# Patient Record
Sex: Male | Born: 1975 | Race: White | Hispanic: No | Marital: Single | Smoking: Former smoker
Health system: Southern US, Community
[De-identification: ages and names within clinical notes are randomized; demographics above are authoritative.]

## PROBLEM LIST (undated history)

## (undated) DIAGNOSIS — D6481 Anemia due to antineoplastic chemotherapy: Principal | ICD-10-CM

## (undated) DIAGNOSIS — R51 Headache: Secondary | ICD-10-CM

## (undated) DIAGNOSIS — K219 Gastro-esophageal reflux disease without esophagitis: Secondary | ICD-10-CM

## (undated) DIAGNOSIS — F419 Anxiety disorder, unspecified: Secondary | ICD-10-CM

## (undated) DIAGNOSIS — F1010 Alcohol abuse, uncomplicated: Secondary | ICD-10-CM

## (undated) DIAGNOSIS — R5383 Other fatigue: Secondary | ICD-10-CM

## (undated) DIAGNOSIS — E876 Hypokalemia: Principal | ICD-10-CM

## (undated) DIAGNOSIS — IMO0001 Reserved for inherently not codable concepts without codable children: Secondary | ICD-10-CM

## (undated) DIAGNOSIS — C801 Malignant (primary) neoplasm, unspecified: Secondary | ICD-10-CM

## (undated) DIAGNOSIS — T451X5A Adverse effect of antineoplastic and immunosuppressive drugs, initial encounter: Principal | ICD-10-CM

## (undated) DIAGNOSIS — J449 Chronic obstructive pulmonary disease, unspecified: Secondary | ICD-10-CM

## (undated) DIAGNOSIS — E039 Hypothyroidism, unspecified: Secondary | ICD-10-CM

## (undated) DIAGNOSIS — R569 Unspecified convulsions: Secondary | ICD-10-CM

## (undated) DIAGNOSIS — F322 Major depressive disorder, single episode, severe without psychotic features: Secondary | ICD-10-CM

## (undated) DIAGNOSIS — F1020 Alcohol dependence, uncomplicated: Secondary | ICD-10-CM

## (undated) DIAGNOSIS — C140 Malignant neoplasm of pharynx, unspecified: Secondary | ICD-10-CM

## (undated) HISTORY — DX: Anemia due to antineoplastic chemotherapy: D64.81

## (undated) HISTORY — DX: Other fatigue: R53.83

## (undated) HISTORY — DX: Hypothyroidism, unspecified: E03.9

## (undated) HISTORY — DX: Adverse effect of antineoplastic and immunosuppressive drugs, initial encounter: T45.1X5A

## (undated) HISTORY — DX: Anxiety disorder, unspecified: F41.9

## (undated) HISTORY — DX: Hypokalemia: E87.6

## (undated) HISTORY — DX: Headache: R51

## (undated) HISTORY — PX: APPENDECTOMY: SHX54

## (undated) HISTORY — PX: KNEE SURGERY: SHX244

---

## 2000-10-06 ENCOUNTER — Emergency Department (HOSPITAL_COMMUNITY): Admission: EM | Admit: 2000-10-06 | Discharge: 2000-10-06 | Payer: Self-pay | Admitting: Emergency Medicine

## 2000-10-21 ENCOUNTER — Emergency Department (HOSPITAL_COMMUNITY): Admission: EM | Admit: 2000-10-21 | Discharge: 2000-10-21 | Payer: Self-pay | Admitting: Emergency Medicine

## 2001-04-05 ENCOUNTER — Emergency Department (HOSPITAL_COMMUNITY): Admission: EM | Admit: 2001-04-05 | Discharge: 2001-04-05 | Payer: Self-pay | Admitting: Emergency Medicine

## 2001-06-25 ENCOUNTER — Emergency Department (HOSPITAL_COMMUNITY): Admission: EM | Admit: 2001-06-25 | Discharge: 2001-06-26 | Payer: Self-pay | Admitting: Emergency Medicine

## 2001-06-25 ENCOUNTER — Encounter: Payer: Self-pay | Admitting: *Deleted

## 2004-03-11 ENCOUNTER — Emergency Department (HOSPITAL_COMMUNITY): Admission: EM | Admit: 2004-03-11 | Discharge: 2004-03-12 | Payer: Self-pay | Admitting: Emergency Medicine

## 2005-06-26 ENCOUNTER — Emergency Department (HOSPITAL_COMMUNITY): Admission: EM | Admit: 2005-06-26 | Discharge: 2005-06-26 | Payer: Self-pay | Admitting: Emergency Medicine

## 2007-03-09 ENCOUNTER — Emergency Department (HOSPITAL_COMMUNITY): Admission: EM | Admit: 2007-03-09 | Discharge: 2007-03-09 | Payer: Self-pay | Admitting: Emergency Medicine

## 2008-03-24 ENCOUNTER — Emergency Department (HOSPITAL_COMMUNITY): Admission: EM | Admit: 2008-03-24 | Discharge: 2008-03-24 | Payer: Self-pay | Admitting: Emergency Medicine

## 2009-02-19 ENCOUNTER — Emergency Department (HOSPITAL_COMMUNITY): Admission: EM | Admit: 2009-02-19 | Discharge: 2009-02-19 | Payer: Self-pay | Admitting: Emergency Medicine

## 2009-02-23 ENCOUNTER — Ambulatory Visit (HOSPITAL_COMMUNITY): Admission: RE | Admit: 2009-02-23 | Discharge: 2009-02-24 | Payer: Self-pay | Admitting: Orthopaedic Surgery

## 2009-09-17 ENCOUNTER — Emergency Department (HOSPITAL_COMMUNITY): Admission: EM | Admit: 2009-09-17 | Discharge: 2009-09-18 | Payer: Self-pay | Admitting: Emergency Medicine

## 2010-03-09 ENCOUNTER — Emergency Department (HOSPITAL_COMMUNITY): Admission: EM | Admit: 2010-03-09 | Discharge: 2010-03-09 | Payer: Self-pay | Admitting: Emergency Medicine

## 2010-07-25 ENCOUNTER — Emergency Department (HOSPITAL_COMMUNITY): Admission: EM | Admit: 2010-07-25 | Discharge: 2010-03-16 | Payer: Self-pay | Admitting: Emergency Medicine

## 2010-09-24 ENCOUNTER — Emergency Department (HOSPITAL_COMMUNITY): Payer: Self-pay

## 2010-09-24 ENCOUNTER — Encounter (HOSPITAL_COMMUNITY): Payer: Self-pay

## 2010-09-24 ENCOUNTER — Emergency Department (HOSPITAL_COMMUNITY)
Admission: EM | Admit: 2010-09-24 | Discharge: 2010-09-24 | Disposition: A | Discharge status: Home / Self Care | Payer: Self-pay | Attending: Emergency Medicine | Admitting: Emergency Medicine

## 2010-09-24 DIAGNOSIS — Y92009 Unspecified place in unspecified non-institutional (private) residence as the place of occurrence of the external cause: Secondary | ICD-10-CM | POA: Insufficient documentation

## 2010-09-24 DIAGNOSIS — S022XXA Fracture of nasal bones, initial encounter for closed fracture: Secondary | ICD-10-CM | POA: Insufficient documentation

## 2010-09-24 DIAGNOSIS — S01501A Unspecified open wound of lip, initial encounter: Secondary | ICD-10-CM | POA: Insufficient documentation

## 2010-11-02 LAB — POCT I-STAT, CHEM 8
BUN: <3 mg/dL — ABNORMAL LOW (ref 6–23)
Calcium, Ion: 0.92 mmol/L — ABNORMAL LOW (ref 1.12–1.32)
Creatinine, Ser: 0.9 mg/dL (ref 0.4–1.5)
Glucose, Bld: 95 mg/dL (ref 70–99)
HCT: 45 % (ref 39.0–52.0)
Hemoglobin: 15.3 g/dL (ref 13.0–17.0)
Sodium: 138 mEq/L (ref 135–145)
TCO2: 22 mmol/L (ref 0–100)

## 2010-11-02 LAB — ETHANOL
Alcohol, Ethyl (B): 380 mg/dL — ABNORMAL HIGH (ref 0–10)
Alcohol, Ethyl (B): 520 mg/dL (ref 0–10)

## 2010-11-24 LAB — HEPATIC FUNCTION PANEL
AST: 43 U/L — ABNORMAL HIGH (ref 0–37)
Albumin: 3.4 g/dL — ABNORMAL LOW (ref 3.5–5.2)
Alkaline Phosphatase: 78 U/L (ref 39–117)
Bilirubin, Direct: 0.2 mg/dL (ref 0.0–0.3)
Total Bilirubin: 1.3 mg/dL — ABNORMAL HIGH (ref 0.3–1.2)

## 2010-11-24 LAB — BASIC METABOLIC PANEL
CO2: 25 mEq/L (ref 19–32)
Chloride: 100 mEq/L (ref 96–112)
Glucose, Bld: 100 mg/dL — ABNORMAL HIGH (ref 70–99)

## 2010-11-24 LAB — DIFFERENTIAL
Lymphocytes Relative: 23 % (ref 12–46)
Lymphs Abs: 1.6 10*3/uL (ref 0.7–4.0)
Neutrophils Relative %: 72 % (ref 43–77)

## 2010-11-24 LAB — COMPREHENSIVE METABOLIC PANEL
AST: 54 U/L — ABNORMAL HIGH (ref 0–37)
Alkaline Phosphatase: 101 U/L (ref 39–117)
CO2: 25 mEq/L (ref 19–32)
Chloride: 108 mEq/L (ref 96–112)
GFR calc Af Amer: >60 mL/min (ref >60)
GFR calc non Af Amer: >60 mL/min (ref >60)
Glucose, Bld: 100 mg/dL — ABNORMAL HIGH (ref 70–99)

## 2010-11-24 LAB — PROTIME-INR
INR: 1 (ref 0.00–1.49)
Prothrombin Time: 13.7 seconds (ref 11.6–15.2)

## 2010-11-24 LAB — CBC
HCT: 36.5 % — ABNORMAL LOW (ref 39.0–52.0)
HCT: 38.8 % — ABNORMAL LOW (ref 39.0–52.0)
Hemoglobin: 12.7 g/dL — ABNORMAL LOW (ref 13.0–17.0)
MCHC: 34.7 g/dL (ref 30.0–36.0)
RBC: 3.88 MIL/uL — ABNORMAL LOW (ref 4.22–5.81)
RDW: 13.9 % (ref 11.5–15.5)
RDW: 14.5 % (ref 11.5–15.5)

## 2010-11-24 LAB — SAMPLE TO BLOOD BANK

## 2010-11-24 LAB — APTT: aPTT: 33 seconds (ref 24–37)

## 2010-11-24 LAB — ETHANOL: Alcohol, Ethyl (B): 314 mg/dL — ABNORMAL HIGH (ref 0–10)
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
## 2011-05-16 LAB — POCT I-STAT, CHEM 8
BUN: 5 — ABNORMAL LOW
Chloride: 106
Creatinine, Ser: 0.7
Potassium: 3.5
Sodium: 142
TCO2: 23

## 2011-05-16 LAB — URINALYSIS, ROUTINE W REFLEX MICROSCOPIC
Glucose, UA: NEGATIVE
Hgb urine dipstick: NEGATIVE
Ketones, ur: NEGATIVE
Protein, ur: NEGATIVE
Urobilinogen, UA: 0.2

## 2011-05-16 LAB — RAPID URINE DRUG SCREEN, HOSP PERFORMED
Amphetamines: NOT DETECTED
Barbiturates: NOT DETECTED
Benzodiazepines: NOT DETECTED

## 2011-05-16 LAB — CBC
Platelets: 207
RDW: 13.8
WBC: 4.2

## 2011-05-16 LAB — ETHANOL: Alcohol, Ethyl (B): 451

## 2011-05-16 LAB — DIFFERENTIAL
Basophils Absolute: 0
Lymphocytes Relative: 49 — ABNORMAL HIGH
Lymphs Abs: 2.1
Neutro Abs: 1.7
Neutrophils Relative %: 40 — ABNORMAL LOW

## 2011-05-16 LAB — TRICYCLICS SCREEN, URINE: TCA Scrn: NOT DETECTED

## 2012-04-15 ENCOUNTER — Encounter (HOSPITAL_COMMUNITY): Payer: Self-pay

## 2012-04-15 ENCOUNTER — Emergency Department (HOSPITAL_COMMUNITY)
Admission: EM | Admit: 2012-04-15 | Discharge: 2012-04-16 | Disposition: A | Discharge status: Other Acute Inpatient Hospital | Payer: Self-pay | Attending: Emergency Medicine | Admitting: Emergency Medicine

## 2012-04-15 DIAGNOSIS — F1010 Alcohol abuse, uncomplicated: Secondary | ICD-10-CM | POA: Insufficient documentation

## 2012-04-15 HISTORY — DX: Alcohol abuse, uncomplicated: F10.10

## 2012-04-15 LAB — CBC
HCT: 39.9 % (ref 39.0–52.0)
Hemoglobin: 14.1 g/dL (ref 13.0–17.0)
MCH: 32.7 pg (ref 26.0–34.0)
MCV: 92.6 fL (ref 78.0–100.0)
Platelets: DECREASED 10*3/uL (ref 150–400)
RBC: 4.31 MIL/uL (ref 4.22–5.81)
WBC: 7.8 10*3/uL (ref 4.0–10.5)

## 2012-04-15 LAB — BASIC METABOLIC PANEL
CO2: 21 mEq/L (ref 19–32)
Chloride: 96 mEq/L (ref 96–112)
Creatinine, Ser: 0.45 mg/dL — ABNORMAL LOW (ref 0.50–1.35)
Glucose, Bld: 86 mg/dL (ref 70–99)

## 2012-04-15 LAB — RAPID URINE DRUG SCREEN, HOSP PERFORMED
Amphetamines: NOT DETECTED
Benzodiazepines: NOT DETECTED
Opiates: NOT DETECTED
Tetrahydrocannabinol: NOT DETECTED

## 2012-04-15 LAB — ETHANOL: Alcohol, Ethyl (B): 340 mg/dL — ABNORMAL HIGH (ref 0–11)

## 2012-04-15 MED ORDER — FOLIC ACID 1 MG PO TABS
1.0000 mg | ORAL_TABLET | Freq: Every day | ORAL | Status: DC
  Administered 2012-04-15 – 2012-04-16 (×2): 1 mg via ORAL
  Filled 2012-04-15 (×2): qty 1

## 2012-04-15 MED ORDER — VITAMIN B-1 100 MG PO TABS
100.0000 mg | ORAL_TABLET | Freq: Every day | ORAL | Status: DC
  Administered 2012-04-15 – 2012-04-16 (×2): 100 mg via ORAL
  Filled 2012-04-15 (×2): qty 1

## 2012-04-15 MED ORDER — ACETAMINOPHEN 325 MG PO TABS: 650.0000 mg | ORAL_TABLET | ORAL | Status: DC | PRN

## 2012-04-15 MED ORDER — ALUM & MAG HYDROXIDE-SIMETH 200-200-20 MG/5ML PO SUSP: 30.0000 mL | ORAL | Status: DC | PRN

## 2012-04-15 MED ORDER — ONDANSETRON HCL 4 MG PO TABS
4.0000 mg | ORAL_TABLET | Freq: Three times a day (TID) | ORAL | Status: DC | PRN
  Administered 2012-04-15 – 2012-04-16 (×2): 4 mg via ORAL
  Filled 2012-04-15 (×2): qty 1

## 2012-04-15 MED ORDER — NICOTINE 21 MG/24HR TD PT24
21.0000 mg | MEDICATED_PATCH | Freq: Every day | TRANSDERMAL | Status: DC
  Administered 2012-04-15 – 2012-04-16 (×2): 21 mg via TRANSDERMAL
  Filled 2012-04-15 (×2): qty 1

## 2012-04-15 MED ORDER — LORAZEPAM 1 MG PO TABS: 0.0000 mg | ORAL_TABLET | Freq: Two times a day (BID) | ORAL | Status: DC

## 2012-04-15 MED ORDER — THIAMINE HCL 100 MG/ML IJ SOLN: 100.0000 mg | Freq: Every day | INTRAMUSCULAR | Status: DC

## 2012-04-15 MED ORDER — ADULT MULTIVITAMIN W/MINERALS CH
1.0000 | ORAL_TABLET | Freq: Every day | ORAL | Status: DC
  Administered 2012-04-15 – 2012-04-16 (×2): 1 via ORAL
  Filled 2012-04-15 (×2): qty 1

## 2012-04-15 MED ORDER — LORAZEPAM 1 MG PO TABS
1.0000 mg | ORAL_TABLET | Freq: Three times a day (TID) | ORAL | Status: DC | PRN
  Administered 2012-04-15: 1 mg via ORAL
  Filled 2012-04-15 (×2): qty 1

## 2012-04-15 MED ORDER — LORAZEPAM 1 MG PO TABS
0.0000 mg | ORAL_TABLET | Freq: Four times a day (QID) | ORAL | Status: DC
  Administered 2012-04-16: 2 mg via ORAL
  Administered 2012-04-16: 1 mg via ORAL
  Filled 2012-04-15: qty 2

## 2012-04-15 MED ORDER — LORAZEPAM 1 MG PO TABS: 1.0000 mg | ORAL_TABLET | Freq: Four times a day (QID) | ORAL | Status: DC | PRN

## 2012-04-15 MED ORDER — LORAZEPAM 2 MG/ML IJ SOLN: 1.0000 mg | Freq: Four times a day (QID) | INTRAMUSCULAR | Status: DC | PRN

## 2012-04-15 MED ORDER — ZOLPIDEM TARTRATE 5 MG PO TABS: 5.0000 mg | ORAL_TABLET | Freq: Every evening | ORAL | Status: DC | PRN

## 2012-04-15 MED ORDER — IBUPROFEN 600 MG PO TABS: 600.0000 mg | ORAL_TABLET | Freq: Three times a day (TID) | ORAL | Status: DC | PRN

## 2012-04-15 NOTE — ED Provider Notes (Signed)
History     CSN: [id number]  Arrival date & time 04/15/12  1238   First MD Initiated Contact with Patient 04/15/12 1501      Chief Complaint  Patient presents with  . Medical Clearance    alcohol detox  . Alcohol Intoxication    (Consider location/radiation/quality/duration/timing/severity/associated sxs/prior treatment) HPI Pt presents with c/o alcohol abuse and is requesting detox.  He states he drinks approx 6, 40 ounce beers per day.  Last drink was this morning approx 7am.  He states he has had withdrawal symptoms in the past, but today has not had any significant complaints.  Denies other drug use.  Does smoke cigarettes.  C/o history of anxiety- states he has never been evaluated for this.  Denies SI/HI.  No recent illnesses  Past Medical History  Diagnosis Date  . ETOH abuse     Past Surgical History  Procedure Date  . Knee surgery   . Appendectomy     Family History  Problem Relation Age of Onset  . Cancer Mother     History  Substance Use Topics  . Smoking status: Current Everyday Smoker -- 1.0 packs/day  . Smokeless tobacco: Not on file  . Alcohol Use: Not on file     6 40-oz. beers daily      Review of Systems ROS reviewed and all otherwise negative except for mentioned in HPI  Allergies  Review of patient's allergies indicates not on file.  Home Medications  No current outpatient prescriptions on file.  BP 103/70  Pulse 95  Temp 99.1 F (37.3 C) (Oral)  Resp 16  SpO2 96% Vitals reviewed Physical Exam Physical Examination: General appearance - alert, well appearing, and in no distress Mental status - alert, oriented to person, place, and time Eyes - bilateral conjunctival injection, no scleral icterus Mouth - mucous membranes moist, pharynx normal without lesions Chest - clear to auscultation, no wheezes, rales or rhonchi, symmetric air entry Heart - normal rate, regular rhythm, normal S1, S2, no murmurs, rubs, clicks or gallops Abdomen  - soft, nontender, nondistended, no masses or organomegaly Neurological - alert, oriented, normal speech, strength 5/5 in extremities x 4, no tremor Extremities - peripheral pulses normal, no pedal edema, no clubbing or cyanosis Skin - normal coloration and turgor, no rashes Psych- flat affect  ED Course  Procedures (including critical care time)  4:20 PM d/w ACT team, psych holding orders written.    Labs Reviewed  BASIC METABOLIC PANEL - Abnormal; Notable for the following:    Sodium 133 (*)     BUN 3 (*)     Creatinine, Ser 0.45 (*)     All other components within normal limits  ETHANOL - Abnormal; Notable for the following:    Alcohol, Ethyl (B) 340 (*)     All other components within normal limits  CBC  URINE RAPID DRUG SCREEN (HOSP PERFORMED)   No results found.   1. Alcohol abuse       MDM  Pt presenting with request for alcohol detox.  His last drink was this morning, he has no signs of active withdrawal.  Pt moved to psych ED, holding orders written, pt is not HI/SI.  ACT team consulted and will evaluate patient.          Ethelda Chick, MD 04/15/12 [phone]

## 2012-04-15 NOTE — ED Notes (Signed)
Sitter at bedside.

## 2012-04-15 NOTE — ED Notes (Signed)
Patient is requesting alcohol detox. Patient states he last drank one 40-oz at 0700 today. Patient denies any drug use.

## 2012-04-16 ENCOUNTER — Inpatient Hospital Stay (HOSPITAL_COMMUNITY)
Admission: AD | Admit: 2012-04-16 | Discharge: 2012-04-20 | DRG: 897 | Disposition: A | Discharge status: Home / Self Care | Payer: Federal, State, Local not specified - Other | Source: Ambulatory Visit | Attending: Psychiatry | Admitting: Psychiatry

## 2012-04-16 ENCOUNTER — Encounter (HOSPITAL_COMMUNITY): Payer: Self-pay | Admitting: *Deleted

## 2012-04-16 DIAGNOSIS — F10239 Alcohol dependence with withdrawal, unspecified: Principal | ICD-10-CM | POA: Diagnosis present

## 2012-04-16 DIAGNOSIS — F1020 Alcohol dependence, uncomplicated: Secondary | ICD-10-CM | POA: Diagnosis present

## 2012-04-16 DIAGNOSIS — E871 Hypo-osmolality and hyponatremia: Secondary | ICD-10-CM | POA: Diagnosis present

## 2012-04-16 DIAGNOSIS — J438 Other emphysema: Secondary | ICD-10-CM | POA: Diagnosis present

## 2012-04-16 DIAGNOSIS — F10939 Alcohol use, unspecified with withdrawal, unspecified: Principal | ICD-10-CM | POA: Diagnosis present

## 2012-04-16 DIAGNOSIS — R7401 Elevation of levels of liver transaminase levels: Secondary | ICD-10-CM | POA: Diagnosis present

## 2012-04-16 DIAGNOSIS — R7402 Elevation of levels of lactic acid dehydrogenase (LDH): Secondary | ICD-10-CM | POA: Diagnosis present

## 2012-04-16 HISTORY — DX: Alcohol dependence, uncomplicated: F10.20

## 2012-04-16 MED ORDER — ALUM & MAG HYDROXIDE-SIMETH 200-200-20 MG/5ML PO SUSP: 30.0000 mL | ORAL | Status: DC | PRN

## 2012-04-16 MED ORDER — CHLORDIAZEPOXIDE HCL 25 MG PO CAPS
25.0000 mg | ORAL_CAPSULE | ORAL | Status: AC
  Administered 2012-04-18 – 2012-04-19 (×2): 25 mg via ORAL
  Filled 2012-04-16 (×2): qty 1

## 2012-04-16 MED ORDER — MAGNESIUM HYDROXIDE 400 MG/5ML PO SUSP: 30.0000 mL | Freq: Every day | ORAL | Status: DC | PRN

## 2012-04-16 MED ORDER — CHLORDIAZEPOXIDE HCL 25 MG PO CAPS
25.0000 mg | ORAL_CAPSULE | Freq: Three times a day (TID) | ORAL | Status: AC
  Administered 2012-04-17 – 2012-04-18 (×3): 25 mg via ORAL
  Filled 2012-04-16 (×3): qty 1

## 2012-04-16 MED ORDER — HYDROXYZINE HCL 25 MG PO TABS
25.0000 mg | ORAL_TABLET | Freq: Four times a day (QID) | ORAL | Status: AC | PRN
  Administered 2012-04-17 – 2012-04-18 (×2): 25 mg via ORAL

## 2012-04-16 MED ORDER — VITAMIN B-1 100 MG PO TABS
100.0000 mg | ORAL_TABLET | Freq: Every day | ORAL | Status: DC
  Administered 2012-04-17 – 2012-04-20 (×4): 100 mg via ORAL
  Filled 2012-04-16 (×6): qty 1

## 2012-04-16 MED ORDER — ADULT MULTIVITAMIN W/MINERALS CH
1.0000 | ORAL_TABLET | Freq: Every day | ORAL | Status: DC
  Administered 2012-04-16 – 2012-04-20 (×5): 1 via ORAL
  Filled 2012-04-16 (×7): qty 1

## 2012-04-16 MED ORDER — ACETAMINOPHEN 325 MG PO TABS
650.0000 mg | ORAL_TABLET | Freq: Four times a day (QID) | ORAL | Status: DC | PRN
Start: 2012-04-16 — End: 2012-04-20

## 2012-04-16 MED ORDER — THIAMINE HCL 100 MG/ML IJ SOLN
100.0000 mg | Freq: Once | INTRAMUSCULAR | Status: AC
  Administered 2012-04-16: 100 mg via INTRAMUSCULAR

## 2012-04-16 MED ORDER — CHLORDIAZEPOXIDE HCL 25 MG PO CAPS
25.0000 mg | ORAL_CAPSULE | Freq: Every day | ORAL | Status: AC
  Administered 2012-04-20: 25 mg via ORAL
  Filled 2012-04-16: qty 1

## 2012-04-16 MED ORDER — CHLORDIAZEPOXIDE HCL 25 MG PO CAPS
25.0000 mg | ORAL_CAPSULE | Freq: Four times a day (QID) | ORAL | Status: AC
  Administered 2012-04-16 – 2012-04-17 (×4): 25 mg via ORAL
  Filled 2012-04-16 (×5): qty 1

## 2012-04-16 MED ORDER — ONDANSETRON 4 MG PO TBDP: 4.0000 mg | ORAL_TABLET | Freq: Four times a day (QID) | ORAL | Status: AC | PRN

## 2012-04-16 MED ORDER — CHLORDIAZEPOXIDE HCL 25 MG PO CAPS
25.0000 mg | ORAL_CAPSULE | Freq: Four times a day (QID) | ORAL | Status: AC | PRN
  Administered 2012-04-17: 25 mg via ORAL
  Filled 2012-04-16: qty 1

## 2012-04-16 MED ORDER — LOPERAMIDE HCL 2 MG PO CAPS: 2.0000 mg | ORAL_CAPSULE | ORAL | Status: AC | PRN

## 2012-04-16 NOTE — ED Provider Notes (Signed)
  Physical Exam  BP 120/75  Pulse 69  Temp 98.6 F (37 C) (Oral)  Resp 19  SpO2 90%  Physical Exam  ED Course  Procedures  MDM Patient has been accepted at [facility] for transfer.      Juliet Rude. Rubin Payor, MD 04/16/12 1329

## 2012-04-16 NOTE — BH Assessment (Signed)
Assessment Note   Marvin [name] is a 36 y.o. male who presents to [facility] for detox from alcohol.  Pt denies SI/HI/Psych.  Pt drinks 6-40oz beers daily, last drink was 04/15/12, pt had 1-40 oz beer.  Pt denies use of any other substances.  Pt has no court dates/driminal charges pending at this time.  Pt has had previous detox treatment with [facility] approx 14 yrs ago, no other treatment since then.  Pt c/o w/d sxs: tremors, sweats and nausea.  Axis I: Alcohol Abuse Axis II: Deferred Axis III:  Past Medical History  Diagnosis Date  . ETOH abuse    Axis IV: other psychosocial or environmental problems, problems related to social environment and problems with primary support group Axis V: 51-60 moderate symptoms  Past Medical History:  Past Medical History  Diagnosis Date  . ETOH abuse     Past Surgical History  Procedure Date  . Knee surgery   . Appendectomy     Family History:  Family History  Problem Relation Age of Onset  . Cancer Mother     Social History:  reports that he has been smoking.  He does not have any smokeless tobacco history on file. He reports that he drinks alcohol. He reports that he does not use illicit drugs.  Additional Social History:  Alcohol / Drug Use Pain Medications: None  Prescriptions: None  Over the Counter: None  History of alcohol / drug use?: Yes Longest period of sobriety (when/how long): None  Negative Consequences of Use: Personal relationships Withdrawal Symptoms: Sweats;Tremors;Nausea / Vomiting Substance #1 Name of Substance 1: Alcohol  1 - Age of First Use: 12 YOM  1 - Amount (size/oz): 6-40's  1 - Frequency: Daily  1 - Duration: On-going  1 - Last Use / Amount: 04/15/12  CIWA: CIWA-Ar BP: 106/76 mmHg Pulse Rate: 91  Nausea and Vomiting: mild nausea with no vomiting Tactile Disturbances: none Tremor: not visible, but can be felt fingertip to fingertip Auditory Disturbances: not present Paroxysmal Sweats: two Visual  Disturbances: not present Anxiety: no anxiety, at ease Headache, Fullness in Head: none present Agitation: normal activity Orientation and Clouding of Sensorium: oriented and can do serial additions CIWA-Ar Total: 4  COWS:    Allergies: Not on File  Home Medications:  (Not in a hospital admission)  OB/GYN Status:  No LMP for male patient.  General Assessment Data [facility] of Assessment: [facility] Living Arrangements: Alone Can pt return to current living arrangement?: Yes Admission Status: Voluntary Is patient capable of signing voluntary admission?: Yes Transfer from: Acute Hospital Referral Source: MD  Education Status Is patient currently in school?: No Current Grade: None  Highest grade of school patient has completed: None  Name of school: None  Contact person: None   Risk to self Suicidal Ideation: No Suicidal Intent: No Is patient at risk for suicide?: No Suicidal Plan?: No Access to Means: No What has been your use of drugs/alcohol within the last 12 months?: Abusing Alcohol  Previous Attempts/Gestures: No How many times?: 0  Other Self Harm Risks: None  Triggers for Past Attempts: None known Intentional Self Injurious Behavior: None Family Suicide History: No Recent stressful life event(s): Other (Comment) (Chronic SA ) Persecutory voices/beliefs?: No Depression: Yes Depression Symptoms: Loss of interest in usual pleasures Substance abuse history and/or treatment for substance abuse?: Yes Suicide prevention information given to non-admitted patients: Not applicable  Risk to Others Homicidal Ideation: No Thoughts of Harm to Others: No Current Homicidal  Intent: No Current Homicidal Plan: No Access to Homicidal Means: No Identified Victim: None  History of harm to others?: No Assessment of Violence: None Noted Violent Behavior Description: None  Does patient have access to weapons?: No Criminal Charges Pending?: No Does patient have a court date:  No  Psychosis Hallucinations: None noted Delusions: None noted  Mental Status Report Appear/Hygiene: Body odor;Disheveled Eye Contact: Good Motor Activity: Tremors Speech: Logical/coherent Level of Consciousness: Quiet/awake Mood: Sad Affect: Sad Anxiety Level: None Thought Processes: Coherent;Relevant Judgement: Unimpaired Orientation: Person;Place;Time;Situation Obsessive Compulsive Thoughts/Behaviors: None  Cognitive Functioning Concentration: Normal Memory: Recent Intact;Remote Intact IQ: Average Insight: Fair Impulse Control: Fair Appetite: Fair Weight Loss: 0  Weight Gain: 0  Sleep: No Change Total Hours of Sleep: 6  Vegetative Symptoms: None  ADLScreening [facility] Assessment Services) Patient's cognitive ability adequate to safely complete daily activities?: Yes Patient able to express need for assistance with ADLs?: Yes Independently performs ADLs?: Yes (appropriate for developmental age)  Abuse/Neglect [facility]) Physical Abuse: Denies Verbal Abuse: Denies Sexual Abuse: Denies  Prior Inpatient Therapy Prior Inpatient Therapy: Yes Prior Therapy Dates: 1999 Prior Therapy Facilty/Provider(s): [facility] Reason for Treatment: Detox   Prior Outpatient Therapy Prior Outpatient Therapy: No Prior Therapy Dates: None  Prior Therapy Facilty/Provider(s): None  Reason for Treatment: None   ADL Screening (condition at time of admission) Patient's cognitive ability adequate to safely complete daily activities?: Yes Patient able to express need for assistance with ADLs?: Yes Independently performs ADLs?: Yes (appropriate for developmental age) Weakness of Legs: None Weakness of Arms/Hands: None  Home Assistive Devices/Equipment Home Assistive Devices/Equipment: None  Therapy Consults (therapy consults require a physician order) PT Evaluation Needed: No OT Evalulation Needed: No SLP Evaluation Needed: No Abuse/Neglect Assessment (Assessment to be complete while patient  is alone) Physical Abuse: Denies Verbal Abuse: Denies Sexual Abuse: Denies Exploitation of patient/patient's resources: Denies Self-Neglect: Denies Values / Beliefs Cultural Requests During Hospitalization: None Spiritual Requests During Hospitalization: None Consults Spiritual Care Consult Needed: No Social Work Consult Needed: No Merchant navy officer (For Healthcare) Advance Directive: Patient does not have advance directive;Patient would not like information Pre-existing out of facility DNR order (yellow form or pink MOST form): No Nutrition Screen- [facility] Patient's home diet: Regular Have you recently lost weight without trying?: No Have you been eating poorly because of a decreased appetite?: No Malnutrition Screening Tool Score: 0   Additional Information 1:1 In Past 12 Months?: No CIRT Risk: No Elopement Risk: No Does patient have medical clearance?: Yes     Disposition:  Disposition Disposition of Patient: Inpatient treatment program;Referred to [facility] ) Type of inpatient treatment program: Adult Patient referred to: Other (Comment) [facility] )  On Site Evaluation by:   Reviewed with Physician:     Murrell Redden 04/16/2012 5:59 AM

## 2012-04-16 NOTE — ED Notes (Signed)
Report called to Jan,RN.  BH ready for pt.
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
## 2012-04-16 NOTE — BH Assessment (Signed)
Assessment Note   Marvin [name] is an 36 y.o. male. who presents to [facility] for detox from alcohol. Pt denies SI/HI/Psych. Pt drinks 6-40oz beers daily, last drink was 04/15/12, pt had 1-40 oz beer. Pt denies use of any other substances. Pt has no court dates/driminal charges pending at this time. Pt has had previous detox treatment with [facility] approx 14 yrs ago, no other treatment since then. Pt c/o w/d sxs: tremors, sweats and nausea.   Pt accepted to [facility] by Verne Spurr, PA to Dr. Koren Shiver ([id number]). Completed support documentation. Updated RN & EDP. Pt is voluntary and to be transported via security.   Axis I: Alcohol Dependence Axis II: Deferred Axis III:  Past Medical History  Diagnosis Date  . ETOH abuse    Axis IV: other psychosocial or environmental problems and problems related to social environment Axis V: 21-30 behavior considerably influenced by delusions or hallucinations OR serious impairment in judgment, communication OR inability to function in almost all areas  Past Medical History:  Past Medical History  Diagnosis Date  . ETOH abuse     Past Surgical History  Procedure Date  . Knee surgery   . Appendectomy     Family History:  Family History  Problem Relation Age of Onset  . Cancer Mother     Social History:  reports that he has been smoking.  He does not have any smokeless tobacco history on file. He reports that he drinks alcohol. He reports that he does not use illicit drugs.  Additional Social History:  Alcohol / Drug Use Pain Medications: None  Prescriptions: None  Over the Counter: None  History of alcohol / drug use?: Yes Longest period of sobriety (when/how long): None  Negative Consequences of Use: Personal relationships Withdrawal Symptoms: Sweats;Tremors;Nausea / Vomiting Substance #1 Name of Substance 1: Alcohol  1 - Age of First Use: 12 YOM  1 - Amount (size/oz): 6-40's  1 - Frequency: Daily  1 - Duration: On-going  1 - Last Use /  Amount: 04/15/12  CIWA: CIWA-Ar BP: 120/75 mmHg Pulse Rate: 69  Nausea and Vomiting: no nausea and no vomiting Tactile Disturbances: none Tremor: not visible, but can be felt fingertip to fingertip Auditory Disturbances: not present Paroxysmal Sweats: barely perceptible sweating, palms moist Visual Disturbances: not present Anxiety: no anxiety, at ease Headache, Fullness in Head: very mild Agitation: normal activity Orientation and Clouding of Sensorium: oriented and can do serial additions CIWA-Ar Total: 3  COWS:    Allergies: Not on File  Home Medications:  (Not in a hospital admission)  OB/GYN Status:  No LMP for male patient.  General Assessment Data [facility]: [facility] Living Arrangements: Alone Can pt return to current living arrangement?: Yes Admission Status: Voluntary Is patient capable of signing voluntary admission?: Yes Transfer from: Acute Hospital Referral Source: MD  Education Status Is patient currently in school?: No Current Grade: None  Highest grade of school patient has completed: None  Name of school: None  Contact person: None   Risk to self Suicidal Ideation: No Suicidal Intent: No Is patient at risk for suicide?: No Suicidal Plan?: No Access to Means: No What has been your use of drugs/alcohol within the last 12 months?: Abusing Alcohol  Previous Attempts/Gestures: No How many times?: 0  Other Self Harm Risks: None  Triggers for Past Attempts: None known Intentional Self Injurious Behavior: None Family Suicide History: No Recent stressful life event(s): Other (Comment) (Chronic SA ) Persecutory voices/beliefs?: No  Depression: Yes Depression Symptoms: Loss of interest in usual pleasures Substance abuse history and/or treatment for substance abuse?: Yes (pt. states that he has been drinking since age 67, drinks 6 - 40oz's/day,denies street drug use.) Suicide prevention information given to non-admitted patients: Not  applicable  Risk to Others Homicidal Ideation: No Thoughts of Harm to Others: No Current Homicidal Intent: No Current Homicidal Plan: No Access to Homicidal Means: No Identified Victim: None  History of harm to others?: No Assessment of Violence: None Noted Violent Behavior Description: None  Does patient have access to weapons?: No Criminal Charges Pending?: No Does patient have a court date: No  Psychosis Hallucinations: None noted Delusions: None noted  Mental Status Report Appear/Hygiene: Body odor;Disheveled Eye Contact: Good Motor Activity: Tremors Speech: Logical/coherent Level of Consciousness: Quiet/awake Mood: Sad Affect: Sad Anxiety Level: None Thought Processes: Coherent;Relevant Judgement: Unimpaired Orientation: Person;Place;Time;Situation Obsessive Compulsive Thoughts/Behaviors: None  Cognitive Functioning Concentration: Normal Memory: Recent Intact;Remote Intact IQ: Average Insight: Fair Impulse Control: Fair Appetite: Fair Weight Loss: 0  Weight Gain: 0  Sleep: No Change Total Hours of Sleep: 6  Vegetative Symptoms: None  ADLScreening [facility] Assessment Services) Patient's cognitive ability adequate to safely complete daily activities?: Yes Patient able to express need for assistance with ADLs?: Yes Independently performs ADLs?: Yes (appropriate for developmental age)  Abuse/Neglect [facility]) Physical Abuse: Denies Verbal Abuse: Denies Sexual Abuse: Denies  Prior Inpatient Therapy Prior Inpatient Therapy: Yes Prior Therapy Dates: 1999 Prior Therapy Facilty/Provider(s): [facility] Reason for Treatment: Detox   Prior Outpatient Therapy Prior Outpatient Therapy: No Prior Therapy Dates: None  Prior Therapy Facilty/Provider(s): None  Reason for Treatment: None   ADL Screening (condition at time of admission) Patient's cognitive ability adequate to safely complete daily activities?: Yes Patient able to express need for assistance with ADLs?:  Yes Independently performs ADLs?: Yes (appropriate for developmental age) Weakness of Legs: None Weakness of Arms/Hands: None  Home Assistive Devices/Equipment Home Assistive Devices/Equipment: None  Therapy Consults (therapy consults require a physician order) PT Evaluation Needed: No OT Evalulation Needed: No SLP Evaluation Needed: No Abuse/Neglect Assessment (Assessment to be complete while patient is alone) Physical Abuse: Denies Verbal Abuse: Denies Sexual Abuse: Denies Exploitation of patient/patient's resources: Denies Self-Neglect: Denies Values / Beliefs Cultural Requests During Hospitalization: None Spiritual Requests During Hospitalization: None Consults Spiritual Care Consult Needed: No Social Work Consult Needed: No Merchant navy officer (For Healthcare) Advance Directive: Patient does not have advance directive;Patient would not like information Pre-existing out of facility DNR order (yellow form or pink MOST form): No Nutrition Screen- [facility] Patient's home diet: Regular Have you recently lost weight without trying?: No Have you been eating poorly because of a decreased appetite?: No Malnutrition Screening Tool Score: 0   Additional Information 1:1 In Past 12 Months?: No CIRT Risk: No Elopement Risk: No Does patient have medical clearance?: Yes     Disposition:  Disposition Disposition of Patient: Inpatient treatment program;Referred to (Accepted [facility]: [place] [facility] [place] (300-1)) Type of inpatient treatment program: Adult Patient referred to: Other (Comment) (Accepted [facility]: [place] [facility] (300-1))  On Site Evaluation by:   Reviewed with Physician:     Romeo Apple 04/16/2012 1:48 PM

## 2012-04-16 NOTE — Progress Notes (Signed)
Patient ID: Marvin [name], male   DOB: 01/01/1976, 36 y.o.   MRN: [id number] D: Pt. denies lethality and A/V/H's or other problems: Pt. states he is feeling much better, but does demonstrate some mild withdrawal symptoms.  Pt. refused HS Trazodone, because it made him too sleepy and he says he "slept all morning and couldn't wake up".  Affect and mood are congruent and Pt. is pleasant.  Pt. seems to be engaged in his recovery. A: Pt. is feeling better, but is denying his symptoms that remain evident.  VSS and CIWA=9, COWS=3. R: Will continue to monitor for withdrawal symptoms and observe for behavioral/mood changes.

## 2012-04-17 ENCOUNTER — Encounter (HOSPITAL_COMMUNITY): Payer: Self-pay | Admitting: Psychiatry

## 2012-04-17 DIAGNOSIS — F1020 Alcohol dependence, uncomplicated: Secondary | ICD-10-CM

## 2012-04-17 HISTORY — DX: Alcohol dependence, uncomplicated: F10.20

## 2012-04-17 LAB — COMPREHENSIVE METABOLIC PANEL
Alkaline Phosphatase: 172 U/L — ABNORMAL HIGH (ref 39–117)
BUN: 11 mg/dL (ref 6–23)
GFR calc Af Amer: >90 mL/min (ref >90)
GFR calc non Af Amer: >90 mL/min (ref >90)
Glucose, Bld: 85 mg/dL (ref 70–99)
Potassium: 3.9 mEq/L (ref 3.5–5.1)
Total Protein: 7.7 g/dL (ref 6.0–8.3)

## 2012-04-17 MED ORDER — NICOTINE 21 MG/24HR TD PT24
21.0000 mg | MEDICATED_PATCH | Freq: Every day | TRANSDERMAL | Status: DC
  Administered 2012-04-17 – 2012-04-20 (×4): 21 mg via TRANSDERMAL
  Filled 2012-04-17 (×6): qty 1

## 2012-04-17 MED ORDER — LORATADINE 10 MG PO TABS
10.0000 mg | ORAL_TABLET | Freq: Every day | ORAL | Status: DC
  Administered 2012-04-17 – 2012-04-20 (×4): 10 mg via ORAL
  Filled 2012-04-17: qty 10
  Filled 2012-04-17 (×6): qty 1

## 2012-04-17 MED ORDER — NAPHAZOLINE HCL 0.1 % OP SOLN
1.0000 [drp] | OPHTHALMIC | Status: DC
  Administered 2012-04-17 – 2012-04-18 (×5): 2 [drp] via OPHTHALMIC
  Administered 2012-04-19: 1 [drp] via OPHTHALMIC
  Administered 2012-04-19 – 2012-04-20 (×4): 2 [drp] via OPHTHALMIC
  Filled 2012-04-17: qty 15

## 2012-04-17 MED ORDER — NAPHAZOLINE-GLYCERIN 0.012-0.2 % OP SOLN
1.0000 [drp] | OPHTHALMIC | Status: DC
  Filled 2012-04-17: qty 15

## 2012-04-17 MED ORDER — ALBUTEROL SULFATE HFA 108 (90 BASE) MCG/ACT IN AERS
2.0000 | INHALATION_SPRAY | Freq: Four times a day (QID) | RESPIRATORY_TRACT | Status: DC | PRN
Start: 2012-04-17 — End: 2012-04-20
  Filled 2012-04-17: qty 6.7

## 2012-04-17 NOTE — H&P (Signed)
Psychiatric Admission Assessment Adult  Patient Identification:  Marvin [name] Date of Evaluation:  04/17/2012 36 yo SWM CC: started withdrawal couldn't buy any more alcohol out of money  ETOH 340  History of Present Illness: Presented to [facility] requesting alcohol detox. Drinks 6 40 oz malt liquors a day. Has detoxed at home before but felt he would be safer getting help.    Past Psychiatric History: 10-12 years ago had a detox   Substance Abuse History: Started drinking age 36  Currently drinks 6 40 oz malt liquors a day   Social History:    reports that he has been smoking.  He does not have any smokeless tobacco history on file. He reports that he drinks alcohol. He reports that he does not use illicit drugs. Went to 6th grade then training school then [employer]. Never married no children. 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  Lives with his sister in [place].  Family Psych History: Denies  Past Medical History:     Past Medical History  Diagnosis Date  . ETOH abuse   . Alcohol dependence 04/17/2012       Past Surgical History  Procedure Date  . Knee surgery   . Appendectomy     Allergies: No Known Allergies  Current Medications:  Prior to Admission medications   Not on File    Mental Status Examination/Evaluation: Objective:  Appearance: Disheveled sclerae very injected   Psychomotor Activity:  Decreased  Eye Contact::  Good  Speech:  Clear and Coherent and Normal Rate  Volume:  Normal  Mood:  allright   Affect:  Constricted  Thought Process: clear rational goal oriented    Orientation:  Full  Thought Content:  No AVH/psychosis   Suicidal Thoughts:  No  Homicidal Thoughts:  No  Judgement:  Fair  Insight:  Fair    DIAGNOSIS:    AXIS I Alcohol Abuse and Substance Abuse  AXIS II Deferred  AXIS III See medical history.  AXIS IV economic problems, educational problems, occupational problems, other psychosocial or environmental problems  and problems with primary support group  AXIS V 51-60 moderate symptoms     Treatment Plan Summary: Admit for safety & stabilization  Detox using the Librium protocol Assess need for any other psychotropics and long term SA treatement
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
## 2012-04-17 NOTE — Progress Notes (Signed)
[facility] Group Notes:  (Counselor/Nursing/MHT/Case Management/Adjunct)  04/17/2012 9:30 AM  Type of Therapy:  Discharge Planning  Participation Level:  Did not attend.  With MDl     Marni Griffon C 1/61/0960, 9:30 AM
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
## 2012-04-17 NOTE — Progress Notes (Signed)
D.  Pt positive for group this evening, denies complaints at this time.  Did request something to help him rest tonight.  Denies SI/HI/hallucinations at this time.  Interacting appropriately on unit.  A.  Support and encouragement offered, prn medications given as appropriate.  R.  Remains in dayroom watching TV with peers, will continue to monitor.

## 2012-04-17 NOTE — Progress Notes (Signed)
Psychoeducational Group Note  Date:  04/17/2012 Time:  1030  Group Topic/Focus:  Identifying Needs:   The focus of this group is to help patients identify their personal needs that have been historically problematic and identify healthy behaviors to address their needs.  Participation Level:  Active  Participation Quality:  Appropriate and Attentive  Affect:  Appropriate  Cognitive:  Alert and Appropriate  Insight:  Good  Engagement in Group:  Good  Additional Comments:    Cresenciano Lick 04/17/2012, 1:33 PM

## 2012-04-17 NOTE — [facility] Suicide Risk Assessment (Signed)
Suicide Risk Assessment  Admission Assessment     Demographic factors:  Assessment Details Time of Assessment: Admission Information Obtained From: Patient Current Mental Status:    Loss Factors:  Loss Factors: Decrease in vocational status Historical Factors:    Risk Reduction Factors:  Risk Reduction Factors: Sense of responsibility to family;Living with another person, especially a relative;Positive social support  CLINICAL FACTORS:   Alcohol/Substance Abuse/Dependencies Previous Psychiatric Diagnoses and Treatments  COGNITIVE FEATURES THAT CONTRIBUTE TO RISK:  None Noted.    Current Mental Status Per Physician:  Diagnosis:  AXIS I: Alcohol Dependence.   The patient was seen today and reports the following:   ADL's: Intact.  Sleep: The patient reports to sleeping well last night without difficulty.  Appetite: The patient reports a good appetite today.   Mild>(1-10) >Severe  Hopelessness (1-10): 0  Depression (1-10): 0  Anxiety (1-10): 0   Suicidal Ideation: The patient denies any suicidal ideations today.  Plan: No  Intent: No  Means: No   Homicidal Ideation: The patient denies any homicidal ideations today.  Plan: No  Intent: No.  Means: No   General Appearance/Behavior: The patient was friendly and cooperative today with this provider but appeared moderately depressed.  Eye Contact: Good.  Speech: Appropriate in rate and volume with no pressuring of speech noted today.  Motor Behavior: wnl.  Level of Consciousness: Alert and Oriented x 3.  Mental Status: Alert and Oriented x 3.  Mood: Appears moderately depressed.  Affect: Appears moderately constricted.  Anxiety Level: No anxiety reported today.  Thought Process: wnl.  Thought Content: The patient denies any auditory or visual hallucinations or delusional thinking.  Perception: wnl.  Judgment: Fair to Good.  Insight: Fair to Good.  Cognition: Oriented to person, place and time.   Current Medications:       . chlordiazePOXIDE  25 mg Oral QID   Followed by  . chlordiazePOXIDE  25 mg Oral TID   Followed by  . chlordiazePOXIDE  25 mg Oral BH-qamhs   Followed by  . chlordiazePOXIDE  25 mg Oral Daily  . loratadine  10 mg Oral Daily  . multivitamin with minerals  1 tablet Oral Daily  . naphazoline-glycerin  1-2 drop Both Eyes BH-q8a2phs  . nicotine  21 mg Transdermal Daily  . thiamine  100 mg Intramuscular Once  . thiamine  100 mg Oral Daily   Review of Systems:  Neurological: No headaches, seizures or dizziness reported.  G.I.: The patient denies any constipation or stomach upset today.  Musculoskeletal: The patient denies any musculoskeletal issues today.  HEENT:  Both eyes are very injected.  Time was spent today discussing with the patient his current symptoms. The patient states that he is sleeping well and reports a good appetite.  He denies any significant feelings of sadness, anhedonia or depressed mood but adamantly denies any suicidal or homicidal ideations. The patient however appears moderately depressed.  The patient denies any auditory or visual hallucinations or delusional thinking.  The patient also denies any significant anxiety symptoms.   The patient states that he was admitted for detox from his use of alcohol and was drinking 6-40 oz beers per day.   Treatment Plan Summary:  1. Daily contact with patient to assess and evaluate symptoms and progress in treatment.  2. Medication management  3. The patient will deny suicidal ideations or homicidal ideations for 48 hours prior to discharge and have a depression and anxiety rating of 3 or less. The patient  will also deny any auditory or visual hallucinations or delusional thinking.  4. The patient will deny any symptoms of substance withdrawal at time of discharge.   Plan:  1. Will start the patient on the medication Claritin at 10 mgs po q am for seasonal allergies. 2. Will start a eye drop q am, 2 pm and hs for  lubrication of dry eyes. 3. Will start the patient on a Librium Protocol for detox from alcohol.  4. Laboratory Studies reviewed.  5. Will order a CMP as well as a UA. 6. Will continue to monitor.   SUICIDE RISK:   Minimal: No identifiable suicidal ideation.  Patients presenting with no risk factors but with morbid ruminations; may be classified as minimal risk based on the severity of the depressive symptoms   Marvin [name] 04/17/2012, 2:24 PM

## 2012-04-17 NOTE — Progress Notes (Signed)
[name] has been out in milieu interacting with peers. A- Affect is flat and behavior is appropriate. Attending groups with good participation.  Rates depression at 4 and hopelessness at 1.  Denies SI and contracts for safety. Verbalizes desire for long term recovery. Moderate insight.  C/O red,itchy eyes which was reported to provider. R-Support and encouragement given. Continue with current POC and evaluation of treatment goals. Continue 15' checks for safety.

## 2012-04-18 LAB — URINALYSIS, ROUTINE W REFLEX MICROSCOPIC
Ketones, ur: NEGATIVE mg/dL
Leukocytes, UA: NEGATIVE
Nitrite: NEGATIVE
Protein, ur: NEGATIVE mg/dL
Urobilinogen, UA: 1 mg/dL (ref 0.0–1.0)
pH: 6.5 (ref 5.0–8.0)

## 2012-04-18 NOTE — Progress Notes (Signed)
Patient did attend the evening speaker AA meeting.  

## 2012-04-18 NOTE — Progress Notes (Signed)
D-Patient quiet but appropriate. Interacting with peers and attending groups.  A- Rates depression at 9 and hopelessness at 1.  Reports good sleep and appetite. States he feels "much better".  Allergy symptoms are reported as "better". Denies SI. R- Compliant with scheduled medication protocol. Support and encouragement offered. Continue current POC and evaluation of treatment goals. Continue 15' checks for safety.

## 2012-04-18 NOTE — Progress Notes (Signed)
D.  Pt pleasant on approach, states that he had a good day.  Denies complaints at this time.  Denies SI/HI/hallucinations at this time.  Interacting appropriately within milieu.  Minimal withdrawal.  Positive for evening group, see group notes.  A.  Support and encouragement offered R.  No acute distress noted, in dayroom watching TV, will continue to monitor.

## 2012-04-18 NOTE — Progress Notes (Signed)
[facility] Adult [facility] Family/Significant Other Suicide Prevention Education  Suicide Prevention Education:  Contact Attempts: Candy [phone]-Pt.'s sister-has been identified by the patient as the family member/significant other with whom the patient will be residing, and identified as the person(s) who will aid the patient in the event of a mental health crisis.  With written consent from the patient, two attempts were made to provide suicide prevention education, prior to and/or following the patient's discharge.  We were unsuccessful in providing suicide prevention education.  A suicide education pamphlet was given to the patient to share with family/significant other.  Date and time of first attempt:  On 04/18/12 by Lamar Blinks  at 4:42pm Date and time of second attempt:  Neila Gear 04/18/2012, 4:40 PM

## 2012-04-18 NOTE — Progress Notes (Signed)
  Marvin [name] is a 36 y.o. male [id number] [birth year]  04/16/2012 Principal Problem:  *Alcohol dependence   Mental Status: Alert mood is calm denies SI/HI/AVH.   Subjective/Objective: Detoxing without major side effects. Sclerae are much less injected today  And his motor is steadier. No hand tremor.    Filed Vitals:   04/18/12 0701  BP: 92/56  Pulse: 130  Temp:   Resp:     Lab Results:   BMET    Component Value Date/Time   NA 136 04/17/2012 1936   K 3.9 04/17/2012 1936   CL 98 04/17/2012 1936   CO2 27 04/17/2012 1936   GLUCOSE 85 04/17/2012 1936   BUN 11 04/17/2012 1936   CREATININE 0.75 04/17/2012 1936   CALCIUM 9.6 04/17/2012 1936   GFRNONAA >90 04/17/2012 1936   GFRAA >90 04/17/2012 1936    Medications:  Scheduled:     . chlordiazePOXIDE  25 mg Oral QID   Followed by  . chlordiazePOXIDE  25 mg Oral TID   Followed by  . chlordiazePOXIDE  25 mg Oral BH-qamhs   Followed by  . chlordiazePOXIDE  25 mg Oral Daily  . loratadine  10 mg Oral Daily  . multivitamin with minerals  1 tablet Oral Daily  . naphazoline  1-2 drop Both Eyes BH-q8a2phs  . nicotine  21 mg Transdermal Daily  . thiamine  100 mg Oral Daily  . DISCONTD: naphazoline-glycerin  1-2 drop Both Eyes BH-q8a2phs     PRN Meds acetaminophen, albuterol, alum & mag hydroxide-simeth, chlordiazePOXIDE, hydrOXYzine, loperamide, magnesium hydroxide, ondansetron  Plan: continue with alcohol detox.Discharhe needs to be addressed tomorrow. Marvin [name],MICKIE D. 04/18/2012

## 2012-04-18 NOTE — Progress Notes (Signed)
Psychoeducational Group Note  Date:  04/18/2012 Time:  1015  Group Topic/Focus:  Crisis Planning:   The purpose of this group is to help patients create a crisis plan for use upon discharge or in the future, as needed.  Participation Level:  Minimal  Participation Quality:  Appropriate  Affect:  Blunted  Cognitive:  Alert  Insight:  Limited  Engagement in Group:  Limited  Additional Comments:    Cresenciano Lick 04/18/2012, 1:55 PM

## 2012-04-18 NOTE — Progress Notes (Signed)
Patient ID: Marvin [name], male   DOB: [birth year], 36 y.o.   MRN: [id number]   [facility] Group Notes:  (Counselor/Nursing/MHT/Case Management/Adjunct)  04/18/2012 1:15 PM  Type of Therapy:  Group Therapy, Dance/Movement Therapy   Participation Level:  Minimal  Participation Quality:  Drowsy  Affect:  Flat  Cognitive:  Appropriate  Insight:  Limited  Engagement in Group:  Limited  Engagement in Therapy:  Limited  Modes of Intervention:  Clarification, Problem-solving, Role-play, Socialization and Support  Summary of Progress/Problems:  Group discussed how to support your self and how to be selfish in a postive way, being selfish wanting your recovery.  Group shared how to use empowering personal statements to stay positive and focused.  Pt. shared a personal statement regarding learning from the past.  Pt. participated in check in activity but other participation was limited.    [facility] 04/18/2012. 2:49 PM

## 2012-04-19 LAB — AMYLASE: Amylase: 86 U/L (ref 0–105)

## 2012-04-19 NOTE — Progress Notes (Addendum)
Pt reports his sleep is well appetite good energy level normal and ability to pay attention is good.  He rated his depression a 2 hopelessness a 1 denied any anxiety on his self-inventory.  Pt denied any S/H ideation or A/V hallucinations.  He denied any symptoms of withdrawal.  He wants to go to a long-term program from here.  He does want to attend AA meetings and get a sponsor when he is discharged from his long-term treatment facility. The only new order for pt today is to check his amylase tonight.

## 2012-04-19 NOTE — Progress Notes (Signed)
Psychoeducational Group Note  Date:  04/19/2012 Time:  1000  Group Topic/Focus:  Therapeutic Activity-Question Ball  Participation Level:  Active  Participation Quality:  Appropriate  Affect:  Appropriate  Cognitive:  Appropriate  Insight:  Good  Engagement in Group:  Good  Additional Comments:  Pt was appropriate and willing to participate in the answering questions from the question ball.   Sharyn Lull 04/19/2012, 10:47 AM

## 2012-04-19 NOTE — Progress Notes (Signed)
Psychoeducational Group Note  Date:  04/19/2012 Time:  1100  Group Topic/Focus:  Self Care:   The focus of this group is to help patients understand the importance of self-care in order to improve or restore emotional, physical, spiritual, interpersonal, and financial health.  Participation Level:  Active  Participation Quality:  Appropriate and Attentive  Affect:  Appropriate  Cognitive:  Appropriate  Insight:  Good  Engagement in Group:  Good  Additional Comments:  Pt participated in self care group. Pt defined self-care, and completed self-care assessment. Pt identified strengths in areas of psychological, physiological, emotional, spiritual of taking care of self. Pt also identified areas where improvement would be needed and making a specific goal to meeting needs of self-care improvement.     Karleen Hampshire Brittini 04/19/2012, 12:58 PM

## 2012-04-19 NOTE — Discharge Planning (Signed)
Marvin [name] attended AM group, good participation.  Came in for detox from alcohol.  Last went through detox 10 years ago.  Wants to go to [facility] rehab from here.  Would prefer to go home over going to [facility] first.  Likely d/c tomorrow after I am able to get a date for screening from [place] at [facility].

## 2012-04-19 NOTE — Progress Notes (Addendum)
[facility] In Patient Progress Note  04/19/2012 9:39 AM Marvin [name] [birth year] [id number]  Diagnosis:  AXIS I: Alcohol Dependence  ADL's:  Intact  Sleep:  Yes,  AEB:  Appetite: Good  Suicidal Ideation: No suicidal ideation, no plan, no intent, no means.  Homicidal Ideation:  No homicidal ideation, no plan, no intent, no means.  Subjective: Patient notes he is doing well and has no complaints.  height is 5\' 11"  (1.803 m) and weight is 61.689 kg (136 lb). His oral temperature is 96.9 F (36.1 C). His blood pressure is 108/71 and his pulse is 123. His respiration is 20.   Objective: Patient is reporting a few mild tremors, some sweats overall no other WD symptoms.He states he is attending all groups. Mental Status: alert Orientation: x 3 General Appearance : casual dress Behavior:  cooperative Eye Contact:  Good Motor Behavior:  Tremors mild Speech:  Normal Level of Consciousness:  Alert Mood:  Euthymic Affect:  Appropriate Anxiety Level:  None Thought Process:  Coherent Thought Content:  WNL Perception:  Normal Judgment:  Fair Insight:  Present Cognition:  At least average Sleep:  Number of Hours: 5.75   Lab Results:  Results for orders placed during the hospital encounter of 04/16/12 (from the past 48 hour(s))  COMPREHENSIVE METABOLIC PANEL     Status: Abnormal   Collection Time   04/17/12  7:36 PM      Component Value Range Comment   Sodium 136  135 - 145 mEq/L    Potassium 3.9  3.5 - 5.1 mEq/L    Chloride 98  96 - 112 mEq/L    CO2 27  19 - 32 mEq/L    Glucose, Bld 85  70 - 99 mg/dL    BUN 11  6 - 23 mg/dL    Creatinine, Ser 8.29  0.50 - 1.35 mg/dL    Calcium 9.6  8.4 - 56.2 mg/dL    Total Protein 7.7  6.0 - 8.3 g/dL    Albumin 3.3 (*) 3.5 - 5.2 g/dL    AST 130 (*) 0 - 37 U/L    ALT 164 (*) 0 - 53 U/L    Alkaline Phosphatase 172 (*) 39 - 117 U/L    Total Bilirubin 0.8  0.3 - 1.2 mg/dL    GFR calc non Af Amer >90  >90 mL/min    GFR calc Af Amer >90  >90 mL/min    URINALYSIS, ROUTINE W REFLEX MICROSCOPIC     Status: Abnormal   Collection Time   04/17/12  7:41 PM      Component Value Range Comment   Color, Urine AMBER (*) YELLOW BIOCHEMICALS MAY BE AFFECTED BY COLOR   APPearance CLEAR  CLEAR    Specific Gravity, Urine 1.021  1.005 - 1.030    pH 6.5  5.0 - 8.0    Glucose, UA NEGATIVE  NEGATIVE mg/dL    Hgb urine dipstick NEGATIVE  NEGATIVE    Bilirubin Urine NEGATIVE  NEGATIVE    Ketones, ur NEGATIVE  NEGATIVE mg/dL    Protein, ur NEGATIVE  NEGATIVE mg/dL    Urobilinogen, UA 1.0  0.0 - 1.0 mg/dL    Nitrite NEGATIVE  NEGATIVE    Leukocytes, UA NEGATIVE  NEGATIVE MICROSCOPIC NOT DONE ON URINES WITH NEGATIVE PROTEIN, BLOOD, LEUKOCYTES, NITRITE, OR GLUCOSE <1000 mg/dL.   Labs are reviewed. AST, ALT significantly elevated Physical Findings: AIMS: Facial and Oral Movements Muscles of Facial Expression: None, normal Lips and Perioral Area: None,  normal Jaw: None, normal Tongue: None, normal,Extremity Movements Upper (arms, wrists, hands, fingers): None, normal Lower (legs, knees, ankles, toes): None, normal, Trunk Movements Neck, shoulders, hips: None, normal, Overall Severity Severity of abnormal movements (highest score from questions above): None, normal Incapacitation due to abnormal movements: None, normal Patient's awareness of abnormal movements (rate only patient's report): No Awareness, Dental Status Current problems with teeth and/or dentures?: No Does patient usually wear dentures?: No  CIWA:  CIWA-Ar Total: 1  COWS:  COWS Total Score: 2    Medications: . chlordiazePOXIDE  25 mg Oral TID   Followed by  . chlordiazePOXIDE  25 mg Oral BH-qamhs   Followed by  . chlordiazePOXIDE  25 mg Oral Daily  . loratadine  10 mg Oral Daily  . multivitamin with minerals  1 tablet Oral Daily  . naphazoline  1-2 drop Both Eyes [id number]  . nicotine  21 mg Transdermal Daily  . thiamine  100 mg Oral Daily   Treatment Plan Summary: 1. Continue  admission for crisis management,stabilization, and detox. 2. Medication management to reduce current symptoms to base line and improve the  patient's overall level of functioning 3. Treat health problems as indicated. 4. Develop treatment plan to decrease risk of relapse upon discharge and the need for readmission. 5. Psycho-social education regarding relapse prevention and self care. 6. Health care follow up as needed for medical problems. 7. Restart home medications where appropriate.  Plan: 1. Continue current plan of care as written.  No changes at this time. 2. Will get serum amylase to evaluate for pancreatitis. Rona Ravens. Abdulmalik Darco PAC 04/19/2012, 9:39 AM

## 2012-04-20 MED ORDER — LORATADINE 10 MG PO TABS: 10.0000 mg | ORAL_TABLET | Freq: Every day | ORAL | Status: DC | PRN

## 2012-04-20 MED ORDER — NAPHAZOLINE HCL 0.1 % OP SOLN: 1.0000 [drp] | Freq: Three times a day (TID) | OPHTHALMIC | Status: AC | PRN

## 2012-04-20 MED ORDER — LORATADINE 10 MG PO TABS
10.0000 mg | ORAL_TABLET | Freq: Every day | ORAL | Status: DC | PRN
  Filled 2012-04-20 (×2): qty 10

## 2012-04-20 NOTE — Progress Notes (Signed)
[facility] Case Management Discharge Plan:  Will you be returning to the same living situation after discharge: No. At discharge, do you have transportation home?:Yes,  brother Do you have the ability to pay for your medications:Yes,  no meds  Interagency Information:     Release of information consent forms completed and in the chart;  Patient's signature needed at discharge.  Patient to Follow up at:  Follow-up Information    Follow up with [facility] rehab on 04/26/2012. (Arrive at 8AM sharp)    Contact information:   [address]  [phone]      Follow up with Attend daily AA mtgs.         Patient denies SI/HI:   Yes,  yes    Safety Planning and Suicide Prevention discussed:  Yes,  yes  Barrier to discharge identified:No.  Summary and Recommendations:   Ida Rogue 04/20/2012, 10:18 AM

## 2012-04-20 NOTE — Progress Notes (Signed)
Pt was discharged home today.  He denied any S/I H/I or A/V hallucinations.  He was given f/u appointment, rx, sample medications, and hotline info booklet.  He voiced understanding to all instructions provided.  He declined the need for smoking cessation materials.  He removed his nicotine patch before he left. 

## 2012-04-20 NOTE — Progress Notes (Signed)
Patient ID: Marvin [name], male   DOB: Mar 14, 1976, 36 y.o.   MRN: [id number]  D:  Pt was pleasant and cooperative during the adm process. Informed the writer that he feels better today than previous days. Pt voiced no complaints or concerns  A:  Encouragement and support was offered . 15 min checks continued for safety.  R:  Pt remains safe.

## 2012-04-20 NOTE — Progress Notes (Addendum)
[facility] Adult Inpatient Family/Significant Other Collateral Contact  Collateral Contact:  Education Completed; Patient's brother, Marvin [name] at [phone]  has been identified by the patient as the family member/significant other who can provide for collateral information. With written consent from the patient, the family member/significant other has been contacted and reports:   That patient will be discharging to [place] home; and Marvin will deliver [place] to [place] no later than 8AM in 9/9/134  Marvin isextremely supportive of patient entering substance abuse treatment   Marvin has never been concerned about patient having suicidal thoughts and has no firearms in his home.   Writer provided phone number and   [facility] telephone number   Clide Dales 04/20/2012 12:47 PM

## 2012-04-20 NOTE — Progress Notes (Signed)
Read and reviewed. 

## 2012-04-20 NOTE — Treatment Plan (Signed)
Interdisciplinary Treatment Plan Update (Adult)  Date: 04/20/2012  Time Reviewed: 9:43 AM   Progress in Treatment: Attending groups: Yes Participating in groups: Yes Taking medication as prescribed: Yes Tolerating medication: Yes   Family/Significant other contact made:  Yes Patient understands diagnosis:  Yes  As evidenced by asking for help with alcohol withdrawal Discussing patient identified problems/goals with staff:  Yes See below Medical problems stabilized or resolved:  Yes Denies suicidal/homicidal ideation: Yes  In tx team Issues/concerns per patient self-inventory:  None Other:  New problem(s) identified: N/A  Reason for Continuation of Hospitalization: Other; describe D/C today  Interventions implemented related to continuation of hospitalization:   Additional comments:  Estimated length of stay:  Discharge Plan: Stay with brother, then go to [facility] rehab  New goal(s): N/A  Review of initial/current patient goals per problem list:   1.  Goal(s): Safely detox from alcohol  Met:  Yes  Target date:9/3  As evidenced by: no withdrawal symptoms, stable vitals  2.  Goal (s): Identify comprehensive sobriety plan  Met:  Yes  Target date: 9/3  As evidenced by: Plans to go to [facility] rehab when they have an opening  3.  Goal(s):  Met:  Yes  Target date:  As evidenced by:  4.  Goal(s):  Met:  Yes  Target date:  As evidenced by:  Attendees: Patient:  Marvin [name] 04/20/2012 9:43 AM  Family:     Physician:  Lupe Carney 04/20/2012 9:43 AM   Nursing: Robbie Louis   04/20/2012 9:43 AM   Case Manager:  Richelle Ito, LCSW 04/20/2012 9:43 AM   Counselor:  Ronda Fairly, LCSWA 04/20/2012 9:43 AM   Other:     Other:     Other:     Other:      Scribe for Treatment Team:   Ida Rogue, 04/20/2012 9:43 AM

## 2012-04-20 NOTE — Progress Notes (Addendum)
[facility] Group Notes:  (Counselor/Nursing/MHT/Case Management/Adjunct)  04/20/2012 3:13 PM  Type of Therapy:  Psychoeducational Skills  Participation Level:  Active  Participation Quality:  Appropriate, Attentive and Sharing  Affect:  Appropriate  Cognitive:  Appropriate  Insight:  Good  Engagement in Group:  Good  Engagement in Therapy:  n/a  Modes of Intervention:  Activity, Education, Problem-solving, Socialization and Support  Summary of Progress/Problems: Hank attended Psychoeducational group that focused on using quality time with support systems/individuals to engage in healthy coping skills. Charled participated in activity guessing about self and peers. Elwin was active while group discussed who their supports are, how they can spend positive quality time with them as a coping skill and a way to strengthen their relationship. He was given a homework assignment to find two ways to improve his support systems and 20 activities he can do to spend quality time with supports.     Wandra Scot 04/20/2012, 3:13 PM

## 2012-04-20 NOTE — [facility] Suicide Risk Assessment (Signed)
Suicide Risk Assessment  Discharge Assessment     Demographic Factors: See chart.  Current Mental Status by Physician: Patient seen and evaluated. Chart reviewed. Patient stated that his mood was "real good". His affect was mood congruent and euthymic. He denied any current thoughts of self injurious behavior, suicidal ideation or homicidal ideation. He denied any significant depressive signs or symptoms at this time. There were no auditory or visual hallucinations, paranoia, delusional thought processes, or mania noted.  Thought process was linear and goal directed.  No psychomotor agitation or retardation was noted. His speech was normal rate, tone and volume. Eye contact was good. Judgment and insight are fair.  Patient has been up and engaged on the unit.  No acute safety concerns reported from team.  Slept well last night as well.  Denied any w/d s/s.  Loss Factors: Decrease in vocational status  Historical Factors:  Family Support; denied hx SI/SIB/suicide attempts  Risk Reduction Factors: Sense of responsibility to family;Living with another person, especially a relative;Positive social support; can stay with family/brother upon discharge; willign to f/u with [facility]  Discharge Diagnoses: Alcohol Use Disorder; Emphysema; Hyponatremia, resolved; Transaminitis    Cognitive Features That Contribute To Risk: none.  Suicide Risk: Patient is currently viewed as a low risk of harm to himself in light of his history and risk factors. There are no acute safety concerns and he is stable for discharge. His continued sobriety, medication management and followup will mitigate against any potential increased risk in the future. He is agreeable with the plan.  Discussed with the team.  Plan Of Care/Follow-up recommendations: Pt seen and evaluated in treatment team. Chart reviewed.  Pt stable for and requesting discharge to brother's home with transition to [facility] residential Tx program.  Pt contracting for safety and does not currently meet [place] involuntary commitment criteria for continued hospitalization against his will.  Mental health treatment, medication management and continued sobriety will mitigate against the potential increased risk of harm to self and/or others.  Discussed the importance of recovery further with pt, as well as, tools to move forward in a healthy & safe manner.  Pt agreeable with the plan.  Discussed with the team.  Please see orders, follow up appointments per AVS and full discharge summary.  Recommend follow up with AA.  Diet: Regular.  Activity: As tolerated.     Lupe Carney 04/20/2012, 1:53 PM

## 2012-04-20 NOTE — Progress Notes (Signed)
Psychoeducational Group Note  Date:  04/20/2012 Time:  1100  Group Topic/Focus:  Recovery Goals:   The focus of this group is to identify appropriate goals for recovery and establish a plan to achieve them.  Participation Level:  Active  Participation Quality:  Appropriate, Attentive and Sharing  Affect:  Appropriate  Cognitive:  Appropriate  Insight:  Good  Engagement in Group:  Good  Additional Comments:  Pt participated in recovery goals group. Pt defined recovery and what it would look like in his life. Pt was given a worksheet on my personal goals for recovery, and wrote down two changes that would be made in order to progress in recovery and what could be done differently that would promote healthy behaviors. Pt stated that he would like to give up drinking and spend more time in support groups and with family.  Karleen Hampshire Brittini 04/20/2012, 1:39 PM

## 2012-04-22 NOTE — Progress Notes (Signed)
Patient Discharge Instructions:  After Visit Summary (AVS):   Faxed to:  04/22/2012 Psychiatric Admission Assessment Note:   Faxed to:  04/22/2012 Suicide Risk Assessment - Discharge Assessment:   Faxed to:  04/22/2012 Faxed/Sent to the Next Level Care provider:  04/22/2012  Faxed to [facility] @ [phone]  Wandra Scot, 04/22/2012, 6:04 PM

## 2012-05-06 NOTE — Discharge Summary (Signed)
Physician Discharge Summary Note   Patient:  Marvin [name] is an 36 y.o., male MRN:  [id number] DOB:  June 07, 1976 Patient phone:  [phone] (home)  Patient address:   [address] [place] [address]  Date of Admission:  04/16/2012 Date of Discharge: 04/20/12  Reason for Admission: see H&P. Principal Problem:  *Alcohol dependence Active Problems:  Transaminitis  Discharge Diagnoses: Alcohol Use Disorder; Emphysema; Hyponatremia, resolved; Transaminitis   Level of Care:  [facility]  Hospital Course:  Pt admitted for crisis stabilization, detox and treatment. Pt attended all therapeutic groups, was active in her treatment planning process and agreed to her current medication regimen for further stability during recovery.  There were no acute issues during treatment and she was open to further residential substance abuse Tx.  All labs were reviewed with her in great detail and medical needs were addressed.  No acute safety issues were noted on the unit. Medications were reviewed with pt and medication education was provided. Mental health treatment, medication management and continued sobriety will mitigate against any increased risk of harm to self and/or others.  Discussed the importance of recovery with pt, as well as, tools to move forward in a healthy & safe manner using the 12 Step Process.    Consults: none.  Significant Diagnostic Studies: see labs.  Discharge Vitals:   Blood pressure 109/65, pulse 100, temperature 97.6 F (36.4 C), temperature source Oral, resp. rate 18, height 5\' 11"  (1.803 m), weight 61.689 kg (136 lb).  Physical Findings: AIMS: Facial and Oral Movements Muscles of Facial Expression: None, normal Lips and Perioral Area: None, normal Jaw: None, normal Tongue: None, normal,Extremity Movements Upper (arms, wrists, hands, fingers): None, normal Lower (legs, knees, ankles, toes): None, normal, Trunk Movements Neck, shoulders, hips: None, normal,  Overall Severity Severity of abnormal movements (highest score from questions above): None, normal Incapacitation due to abnormal movements: None, normal Patient's awareness of abnormal movements (rate only patient's report): No Awareness, Dental Status Current problems with teeth and/or dentures?: No Does patient usually wear dentures?: No  CIWA:  CIWA-Ar Total: 1  COWS:  COWS Total Score: 2   Mental Status Exam: See Mental Status Examination and Suicide Risk Assessment completed by Attending Physician prior to discharge.  Discharge destination:  [facility]  Is patient on multiple antipsychotic therapies at discharge:  No   Has Patient had three or more failed trials of antipsychotic monotherapy by history:  No  Recommended Plan for Multiple Antipsychotic Therapies: NA    Medication List     As of 05/06/2012 12:38 PM    TAKE these medications      Indication    loratadine 10 MG tablet   Commonly known as: CLARITIN   Take 1 tablet (10 mg total) by mouth daily as needed for allergies.            Follow-up Information    Follow up with [facility] rehab on 04/26/2012. (Arrive at 8AM sharp)    Contact information:   [address]  [phone]      Follow up with Attend daily AA mtgs.         Plan Of Care/Follow-up recommendations: Pt seen and evaluated in treatment team. Chart reviewed. Pt stable for and requesting discharge to brother's home with transition to [facility] residential Tx program. Pt contracting for safety and does not currently meet [place] involuntary commitment criteria for continued hospitalization against his will. Mental health treatment, medication  management and continued sobriety will mitigate against the potential increased risk of harm to self and/or others. Discussed the importance of recovery further with pt, as well as, tools to move forward in a healthy & safe manner. Pt agreeable with the plan. Recommend follow up with AA. Diet:  Regular. Activity: As tolerated.   Signed: Lupe Carney 05/06/2012, 12:38 PM

## 2012-09-24 ENCOUNTER — Encounter (HOSPITAL_COMMUNITY): Payer: Self-pay | Admitting: Emergency Medicine

## 2012-09-24 ENCOUNTER — Emergency Department (HOSPITAL_COMMUNITY): Payer: Self-pay

## 2012-09-24 ENCOUNTER — Emergency Department (HOSPITAL_COMMUNITY)
Admission: EM | Admit: 2012-09-24 | Discharge: 2012-09-24 | Disposition: A | Discharge status: Home Health | Payer: Self-pay | Attending: Emergency Medicine | Admitting: Emergency Medicine

## 2012-09-24 DIAGNOSIS — F17200 Nicotine dependence, unspecified, uncomplicated: Secondary | ICD-10-CM | POA: Insufficient documentation

## 2012-09-24 DIAGNOSIS — R55 Syncope and collapse: Secondary | ICD-10-CM | POA: Insufficient documentation

## 2012-09-24 DIAGNOSIS — F1010 Alcohol abuse, uncomplicated: Secondary | ICD-10-CM

## 2012-09-24 DIAGNOSIS — F1020 Alcohol dependence, uncomplicated: Secondary | ICD-10-CM | POA: Insufficient documentation

## 2012-09-24 DIAGNOSIS — R259 Unspecified abnormal involuntary movements: Secondary | ICD-10-CM | POA: Insufficient documentation

## 2012-09-24 LAB — URINE MICROSCOPIC-ADD ON

## 2012-09-24 LAB — CBC WITH DIFFERENTIAL/PLATELET
Basophils Absolute: 0 10*3/uL (ref 0.0–0.1)
Basophils Relative: 0 % (ref 0–1)
HCT: 39.1 % (ref 39.0–52.0)
Hemoglobin: 13.6 g/dL (ref 13.0–17.0)
Lymphocytes Relative: 8 % — ABNORMAL LOW (ref 12–46)
MCHC: 34.8 g/dL (ref 30.0–36.0)
Monocytes Relative: 10 % (ref 3–12)
Neutro Abs: 5.7 10*3/uL (ref 1.7–7.7)
Neutrophils Relative %: 83 % — ABNORMAL HIGH (ref 43–77)
RDW: 15.8 % — ABNORMAL HIGH (ref 11.5–15.5)
WBC: 6.8 10*3/uL (ref 4.0–10.5)

## 2012-09-24 LAB — RAPID URINE DRUG SCREEN, HOSP PERFORMED
Amphetamines: NOT DETECTED
Barbiturates: NOT DETECTED
Benzodiazepines: NOT DETECTED
Tetrahydrocannabinol: NOT DETECTED

## 2012-09-24 LAB — COMPREHENSIVE METABOLIC PANEL
AST: 168 U/L — ABNORMAL HIGH (ref 0–37)
Albumin: 3.4 g/dL — ABNORMAL LOW (ref 3.5–5.2)
Alkaline Phosphatase: 159 U/L — ABNORMAL HIGH (ref 39–117)
CO2: 24 mEq/L (ref 19–32)
Chloride: 94 mEq/L — ABNORMAL LOW (ref 96–112)
GFR calc non Af Amer: >90 mL/min (ref >90)
Potassium: 3.4 mEq/L — ABNORMAL LOW (ref 3.5–5.1)
Total Bilirubin: 0.8 mg/dL (ref 0.3–1.2)

## 2012-09-24 LAB — URINALYSIS, ROUTINE W REFLEX MICROSCOPIC
Glucose, UA: NEGATIVE mg/dL
Hgb urine dipstick: NEGATIVE
Ketones, ur: 15 mg/dL — AB
pH: 6 (ref 5.0–8.0)

## 2012-09-24 MED ORDER — SODIUM CHLORIDE 0.9 % IV BOLUS (SEPSIS)
1000.0000 mL | Freq: Once | INTRAVENOUS | Status: AC
  Administered 2012-09-24: 1000 mL via INTRAVENOUS

## 2012-09-24 NOTE — ED Notes (Signed)
Johnnette Gourd, MD at bedside

## 2012-09-24 NOTE — ED Notes (Signed)
Patient is resting comfortably. 

## 2012-09-24 NOTE — ED Notes (Signed)
Per EMS; pt has hx of alcoholism; pt was working in yard when he began to seize; pt had a seizure that last about 15 minutes; pt was foaming at the mouth, full body convulsions per witness; pt alert and oriented upon arrival of EMS; pt did not appear to have post ictal period; pts last drink was yesterday 2 40 oz beers, pts usual is all everyday drinking habits; VS BP 134/97 HR 106 20 RR 98% CBG 128. Denies hx, medications and allergies. Pt had possible syncopal episode Monday after locking up; 18 G L AC.

## 2012-09-24 NOTE — ED Notes (Signed)
Pt states he feels better and is ready to go home. Wants to speak with Dr about finding a MD to follow up with

## 2012-09-24 NOTE — ED Provider Notes (Signed)
History     CSN: [id number]  Arrival date & time 09/24/12  1653   First MD Initiated Contact with Patient 09/24/12 1656      Chief Complaint  Patient presents with  . Seizures    (Consider location/radiation/quality/duration/timing/severity/associated sxs/prior treatment) Patient is a 37 y.o. male presenting with syncope. The history is provided by the patient.  Loss of Consciousness This is a new problem. The current episode started today. The problem occurs constantly. The problem has been resolved. Pertinent negatives include no abdominal pain, arthralgias, chest pain, congestion, fatigue, fever, headaches, nausea, rash, vomiting or weakness. Nothing aggravates the symptoms. He has tried nothing for the symptoms.    Past Medical History  Diagnosis Date  . ETOH abuse   . Alcohol dependence 04/17/2012    Past Surgical History  Procedure Laterality Date  . Knee surgery    . Appendectomy      Family History  Problem Relation Age of Onset  . Cancer Mother     History  Substance Use Topics  . Smoking status: Current Every Day Smoker -- 1.00 packs/day  . Smokeless tobacco: Not on file  . Alcohol Use: Yes     Comment: 6 40-oz. beers daily      Review of Systems  Constitutional: Negative for fever and fatigue.  HENT: Negative for congestion, rhinorrhea and postnasal drip.   Eyes: Negative for photophobia and visual disturbance.  Respiratory: Negative for chest tightness, shortness of breath and wheezing.   Cardiovascular: Positive for syncope. Negative for chest pain, palpitations and leg swelling.  Gastrointestinal: Negative for nausea, vomiting, abdominal pain and diarrhea.  Genitourinary: Negative for urgency, frequency, discharge and difficulty urinating.  Musculoskeletal: Negative for back pain and arthralgias.  Skin: Negative for rash and wound.  Neurological: Positive for tremors and syncope. Negative for weakness and headaches.  Psychiatric/Behavioral:  Negative for confusion and agitation.    Allergies  Review of patient's allergies indicates no known allergies.  Home Medications  No current outpatient prescriptions on file.  BP 105/77  Pulse 73  Temp(Src) 99.1 F (37.3 C) (Oral)  Resp 15  Ht 6\' 1"  (1.854 m)  Wt 130 lb (58.968 kg)  BMI 17.16 kg/m2  SpO2 95%  Physical Exam  Nursing note and vitals reviewed. Constitutional: He is oriented to person, place, and time. He appears well-developed and well-nourished. No distress.  HENT:  Head: Normocephalic and atraumatic.  Mouth/Throat: Oropharynx is clear and moist.  Eyes: EOM are normal. Pupils are equal, round, and reactive to light.  Neck: Normal range of motion. Neck supple.  Cardiovascular: Normal rate, regular rhythm, normal heart sounds and intact distal pulses.   Pulmonary/Chest: Effort normal and breath sounds normal. He has no wheezes. He has no rales.  Abdominal: Soft. Bowel sounds are normal. He exhibits no distension. There is no tenderness. There is no rebound and no guarding.  Musculoskeletal: Normal range of motion. He exhibits no edema and no tenderness.  Lymphadenopathy:    He has no cervical adenopathy.  Neurological: He is alert and oriented to person, place, and time. He displays normal reflexes. No cranial nerve deficit. He exhibits normal muscle tone. Coordination normal.  Skin: Skin is warm and dry. No rash noted.  Psychiatric: He has a normal mood and affect. His behavior is normal.    ED Course  Procedures (including critical care time)   Date: 09/25/2012  Rate: 104  Rhythm: sinus tachycardia  QRS Axis: normal  Intervals: normal  ST/T Wave abnormalities: normal  Conduction Disutrbances:none  Narrative Interpretation:   Old EKG Reviewed: changes noted now in sinus tachycardia compared to 02/19/09    Labs Reviewed  CBC WITH DIFFERENTIAL - Abnormal; Notable for the following:    RDW 15.8 (*)    Platelets 92 (*)    Neutrophils Relative 83 (*)     Lymphocytes Relative 8 (*)    Lymphs Abs 0.5 (*)    All other components within normal limits  COMPREHENSIVE METABOLIC PANEL - Abnormal; Notable for the following:    Sodium 132 (*)    Potassium 3.4 (*)    Chloride 94 (*)    Albumin 3.4 (*)    AST 168 (*)    ALT 86 (*)    Alkaline Phosphatase 159 (*)    All other components within normal limits  URINALYSIS, ROUTINE W REFLEX MICROSCOPIC - Abnormal; Notable for the following:    Color, Urine ORANGE (*)    APPearance HAZY (*)    Specific Gravity, Urine 1.031 (*)    Bilirubin Urine SMALL (*)    Ketones, ur 15 (*)    Protein, ur 30 (*)    Nitrite POSITIVE (*)    Leukocytes, UA SMALL (*)    All other components within normal limits  URINE MICROSCOPIC-ADD ON - Abnormal; Notable for the following:    Casts HYALINE CASTS (*)    Crystals CA OXALATE CRYSTALS (*)    All other components within normal limits  ETHANOL  URINE RAPID DRUG SCREEN (HOSP PERFORMED)   Ct Head Wo Contrast  09/24/2012  *RADIOLOGY REPORT*  Clinical Data: Post seizure  CT HEAD WITHOUT CONTRAST  Technique:  Contiguous axial images were obtained from the base of the skull through the vertex without contrast.  Comparison: 09/24/2010  Findings: No evidence of parenchymal hemorrhage or extra-axial fluid collection. No mass lesion, mass effect, or midline shift.  No CT evidence of acute infarction.  Mild periventricular/subcortical white matter hypodensity, possibly reflecting small vessel ischemic changes.  Cerebral volume is age appropriate.  No ventriculomegaly.  Mucosal thickening in the right maxillary sinus.  Mastoid air cells are clear.  No evidence of calvarial fracture.  IMPRESSION: No evidence of acute intracranial abnormality.   Original Report Authenticated By: Charline Bills, M.D.      1. Syncope   2. Alcohol abuse       MDM  22M with pmhx of chronic alcoholism who presents with seizure-like episode. Normally drinks 2 40 oz beers daily and last drink was  last night. Today, he passed out and had shaking-like episode. Brother who witnessed the event seemed to be concerned about a seizure. Pt never had seizures in the past. He is not within the window to have alcohol-withdrawal seizures. Could have been a syncopal episode which seems most likely. He had no post-ictal event either. Exam as noted above. No neurologic deficits. Will obtain labs, head CT, EKG, and orthostatics. Given IVF.  Head CT unremarkable. HR improved. Labs concerning for mild dehydration with mild hyponatremia and hypochloremia with some ketones in urine. Normal renal function and potassium. Pt overall feeling better after fluids. Although story more c/w syncope, unable to fully r/o new-onset seizures. Given seizure precautions to patient and family and given number for [facility]. Return precautions given.       Johnnette Gourd, MD 09/25/12 1719

## 2012-09-24 NOTE — ED Notes (Signed)
Pt remains cooperative, family members at bedside

## 2012-09-24 NOTE — ED Notes (Signed)
Pt states he remembers falling back; pt denies pain; pt denies blurred vision; pt denies headache; pt denies nausea; pt denies numbness/tingling; pt denies dizziness/ligtheadedness; pt alert and mentating appropriately; pt states last drink was between 7-8pm last night; pt states he had 2 forty ounce beers yesterday.

## 2012-09-24 NOTE — ED Notes (Signed)
Family to bedside.

## 2012-09-24 NOTE — ED Notes (Signed)
Pt states "I forgot, I did have a beer this morning." Pt states he had a 24 ounce beer this morning.

## 2012-09-24 NOTE — ED Notes (Signed)
MD at bedside going over D/c instructions

## 2012-09-26 ENCOUNTER — Emergency Department (HOSPITAL_COMMUNITY)
Admission: EM | Admit: 2012-09-26 | Discharge: 2012-09-26 | Disposition: A | Discharge status: Home / Self Care | Payer: Self-pay | Attending: Emergency Medicine | Admitting: Emergency Medicine

## 2012-09-26 ENCOUNTER — Encounter (HOSPITAL_COMMUNITY): Payer: Self-pay | Admitting: Emergency Medicine

## 2012-09-26 DIAGNOSIS — F1020 Alcohol dependence, uncomplicated: Secondary | ICD-10-CM | POA: Insufficient documentation

## 2012-09-26 DIAGNOSIS — F17200 Nicotine dependence, unspecified, uncomplicated: Secondary | ICD-10-CM | POA: Insufficient documentation

## 2012-09-26 DIAGNOSIS — F1010 Alcohol abuse, uncomplicated: Secondary | ICD-10-CM

## 2012-09-26 LAB — CBC
Hemoglobin: 13.8 g/dL (ref 13.0–17.0)
MCV: 91.1 fL (ref 78.0–100.0)
Platelets: 108 10*3/uL — ABNORMAL LOW (ref 150–400)
RBC: 4.38 MIL/uL (ref 4.22–5.81)
WBC: 4.7 10*3/uL (ref 4.0–10.5)

## 2012-09-26 LAB — BASIC METABOLIC PANEL
CO2: 20 mEq/L (ref 19–32)
Chloride: 101 mEq/L (ref 96–112)
Creatinine, Ser: 0.44 mg/dL — ABNORMAL LOW (ref 0.50–1.35)

## 2012-09-26 MED ORDER — LORAZEPAM 1 MG PO TABS: 0.0000 mg | ORAL_TABLET | Freq: Four times a day (QID) | ORAL | Status: DC

## 2012-09-26 MED ORDER — VITAMIN B-1 100 MG PO TABS: 100.0000 mg | ORAL_TABLET | Freq: Every day | ORAL | Status: DC

## 2012-09-26 MED ORDER — LORAZEPAM 1 MG PO TABS: 1.0000 mg | ORAL_TABLET | Freq: Four times a day (QID) | ORAL | Status: DC | PRN

## 2012-09-26 MED ORDER — ADULT MULTIVITAMIN W/MINERALS CH: 1.0000 | ORAL_TABLET | Freq: Every day | ORAL | Status: DC

## 2012-09-26 MED ORDER — FOLIC ACID 1 MG PO TABS: 1.0000 mg | ORAL_TABLET | Freq: Every day | ORAL | Status: DC

## 2012-09-26 MED ORDER — THIAMINE HCL 100 MG/ML IJ SOLN: 100.0000 mg | Freq: Every day | INTRAMUSCULAR | Status: DC

## 2012-09-26 MED ORDER — LORAZEPAM 2 MG/ML IJ SOLN: 1.0000 mg | Freq: Four times a day (QID) | INTRAMUSCULAR | Status: DC | PRN

## 2012-09-26 MED ORDER — POTASSIUM CHLORIDE CRYS ER 20 MEQ PO TBCR
20.0000 meq | EXTENDED_RELEASE_TABLET | Freq: Once | ORAL | Status: AC
  Administered 2012-09-26: 20 meq via ORAL
  Filled 2012-09-26: qty 1

## 2012-09-26 MED ORDER — LORAZEPAM 1 MG PO TABS: 0.0000 mg | ORAL_TABLET | Freq: Two times a day (BID) | ORAL | Status: DC

## 2012-09-26 NOTE — ED Notes (Signed)
Pt ambulating good.  Able to urinate. Reports he is feeling better than when he came in.

## 2012-09-26 NOTE — ED Provider Notes (Signed)
History     CSN: [id number]  Arrival date & time 09/26/12  1843   First MD Initiated Contact with Patient 09/26/12 1848      Chief Complaint  Patient presents with  . Seizures    (Consider location/radiation/quality/duration/timing/severity/associated sxs/prior treatment) HPI  Pt presents to the ED with a history of first time seizure a couple of days ago. He is a chronic alcohol and admits that he had gone over 12 hours without an alcohol beverage. He has not had a seizure today but presents to the ED concerned that he may have another seizure because he felt spasms in his hands prior to arrival. No seizures noted in the ED. He is at baseline. He does not take any medications for his seizures. He had no seizure activity with no bowel or bladder incontinence, no tongue biting, trauma or head injury. Pt does not see a PCP regularly. Pt informs me that he has drank multiple 40's today and that he is not withdrawing. He does not wish to detox today. He has had multiple detox attempts. Denies Si/HI/Hallucinations. nad vss   Past Medical History  Diagnosis Date  . ETOH abuse   . Alcohol dependence 04/17/2012    Past Surgical History  Procedure Laterality Date  . Knee surgery    . Appendectomy      Family History  Problem Relation Age of Onset  . Cancer Mother     History  Substance Use Topics  . Smoking status: Current Every Day Smoker -- 1.00 packs/day  . Smokeless tobacco: Not on file  . Alcohol Use: Yes     Comment: 6 40-oz. beers daily      Review of Systems  Review of Systems  Gen: no weight loss, fevers, chills, night sweats  Eyes: no discharge or drainage, no occular pain or visual changes  Nose: no epistaxis or rhinorrhea  Mouth: no dental pain, no sore throat  Neck: no neck pain  Lungs:No wheezing, coughing or hemoptysis CV: no chest pain, palpitations, dependent edema or orthopnea  Abd: no abdominal pain, nausea, vomiting  GU: no dysuria or gross hematuria   MSK:  Muscle spasms  Neuro: no headache, no focal neurologic deficits, pt intoxicated  Skin: no abnormalities Psyche: negative.   Allergies  Review of patient's allergies indicates no known allergies.  Home Medications  No current outpatient prescriptions on file.  BP 95/56  Pulse 72  Temp(Src) 97.5 F (36.4 C) (Oral)  Resp 15  SpO2 96%  Physical Exam  Nursing note and vitals reviewed. Constitutional: He appears well-developed and well-nourished. No distress.  HENT:  Head: Normocephalic and atraumatic.  Eyes: Pupils are equal, round, and reactive to light.  Neck: Normal range of motion. Neck supple.  Cardiovascular: Normal rate and regular rhythm.   Pulmonary/Chest: Effort normal.  Abdominal: Soft.  Musculoskeletal:  Normal ROM of musculoskeletal extremities without contraction, atrophy or deformity. NO obvious ecchymosis or lacerations noted.  Neurological: He is alert. He has normal strength. No cranial nerve deficit.  intoxicated  Skin: Skin is warm and dry.    ED Course  Procedures (including critical care time)  Labs Reviewed  ETHANOL - Abnormal; Notable for the following:    Alcohol, Ethyl (B) 294 (*)    All other components within normal limits  CBC - Abnormal; Notable for the following:    RDW 15.8 (*)    Platelets 108 (*)    All other components within normal limits  BASIC METABOLIC PANEL - Abnormal;  Notable for the following:    Potassium 3.4 (*)    BUN 4 (*)    Creatinine, Ser 0.44 (*)    All other components within normal limits   No results found.   1. Chronic alcohol abuse       MDM  Pt has not had seizure but concern that one may be coming. No seizure while in ED after being monitored for 2 hours. He was given Ativan to prevent wd symptoms. Labs are not acute. Pt is at baseline. Discussed case with Dr. Anitra Lauth before DC. No further medical intervention necessary at this time. Pt declines detox.  Pt has been advised of the symptoms that  warrant their return to the ED. Patient has voiced understanding and has agreed to follow-up with the PCP or specialist.         Dorthula Matas, PA 09/29/12 [phone]

## 2012-09-26 NOTE — ED Notes (Signed)
Per EMS - pt had seizure on Friday. Family member reports he started to have his "pre-seizure activity" called ems. ems arrived pt was stable, no seizure like activity. Pt never had a seizure. BP 138/90 HR 52-60s CBG 96. EMS started a 20 G in left hand.

## 2012-09-26 NOTE — ED Notes (Addendum)
Pt reports his hands started locking up and he wasn't feeling right, the last time he had a seizure the same thing happened so his family called EMS. Pt denies taking any seizure medication. Reports he has only had 2 in the past, last one was on Friday. Pt drinks ETOH daily, reports he has had a couple of 40oz beers tonight, last drink was right before EMS picked him up. Pt's eyes are red, pt smells like cigarettes, breath smells like ETOH. Speech slurred, equal hand grips, no facial droop. Pt in nad, skin warm and dry, resp e/u.

## 2012-09-27 NOTE — ED Provider Notes (Signed)
I have supervised the resident on the management of this patient and agree with the note above. I personally interviewed and examined the patient and my addendum is below.   Marvin [name] is a 37 y.o. male hx of chronic alcoholism here with seizure like episode. Last drink was the night before. It was witnessed by his brother who is currently not present. He said that prior to the episode he felt light headed and dizzy. CT head nl. Patient doesn't appear post ictal and the seizure is not in the time frame for alcohol-withdrawal seizures and he is not in withdrawal clinically. In all, I think he may have a syncope instead of seizure. Not orthostatic. EKG unremarkable. Patient hydrated and felt better. Will d/c home with neuro f/u. Even if he has new onset seizure, won't require antiepileptic meds today.    Richardean Canal, MD 09/27/12 [phone]

## 2012-09-29 NOTE — ED Provider Notes (Signed)
Medical screening examination/treatment/procedure(s) were performed by non-physician practitioner and as supervising physician I was immediately available for consultation/collaboration.   Gwyneth Sprout, MD 09/29/12 2103

## 2012-10-08 ENCOUNTER — Encounter (HOSPITAL_COMMUNITY): Payer: Self-pay | Admitting: Emergency Medicine

## 2012-10-08 ENCOUNTER — Emergency Department (HOSPITAL_COMMUNITY)
Admission: EM | Admit: 2012-10-08 | Discharge: 2012-10-09 | Disposition: A | Discharge status: Home / Self Care | Payer: Self-pay | Attending: Emergency Medicine | Admitting: Emergency Medicine

## 2012-10-08 DIAGNOSIS — R569 Unspecified convulsions: Secondary | ICD-10-CM | POA: Insufficient documentation

## 2012-10-08 DIAGNOSIS — F1020 Alcohol dependence, uncomplicated: Secondary | ICD-10-CM

## 2012-10-08 DIAGNOSIS — F10229 Alcohol dependence with intoxication, unspecified: Secondary | ICD-10-CM | POA: Insufficient documentation

## 2012-10-08 DIAGNOSIS — F17200 Nicotine dependence, unspecified, uncomplicated: Secondary | ICD-10-CM | POA: Insufficient documentation

## 2012-10-08 LAB — BASIC METABOLIC PANEL
BUN: 4 mg/dL — ABNORMAL LOW (ref 6–23)
Chloride: 97 mEq/L (ref 96–112)
Creatinine, Ser: 0.5 mg/dL (ref 0.50–1.35)
GFR calc Af Amer: >90 mL/min (ref >90)
GFR calc non Af Amer: >90 mL/min (ref >90)
Glucose, Bld: 81 mg/dL (ref 70–99)

## 2012-10-08 LAB — CBC
HCT: 41.1 % (ref 39.0–52.0)
Hemoglobin: 14.8 g/dL (ref 13.0–17.0)
MCHC: 36 g/dL (ref 30.0–36.0)
MCV: 90.1 fL (ref 78.0–100.0)
RDW: 15.7 % — ABNORMAL HIGH (ref 11.5–15.5)

## 2012-10-08 MED ORDER — THIAMINE HCL 100 MG/ML IJ SOLN
100.0000 mg | Freq: Every day | INTRAMUSCULAR | Status: DC
  Administered 2012-10-08: 100 mg via INTRAVENOUS
  Filled 2012-10-08: qty 2

## 2012-10-08 MED ORDER — SODIUM CHLORIDE 0.9 % IV SOLN
Freq: Once | INTRAVENOUS | Status: AC
  Administered 2012-10-09: via INTRAVENOUS

## 2012-10-08 MED ORDER — SODIUM CHLORIDE 0.9 % IV BOLUS (SEPSIS)
1000.0000 mL | Freq: Once | INTRAVENOUS | Status: AC
  Administered 2012-10-08: 1000 mL via INTRAVENOUS

## 2012-10-08 NOTE — ED Notes (Addendum)
Per pt: drunk three 40s today and began to feel weak. States he had a seizure around lunch time.Pt states brother said seizure lasted about 1 minute and he was out for about 5 minutes.

## 2012-10-08 NOTE — ED Provider Notes (Signed)
History     CSN: [id number]  Arrival date & time 10/08/12  1749   First MD Initiated Contact with Patient 10/08/12 1828      Chief Complaint  Patient presents with  . Seizures    (Consider location/radiation/quality/duration/timing/severity/associated sxs/prior treatment) HPI Comments: LEVEL 5 CAVEAT - PT IS INTOXICATED.  Pt comes in with cc of seizures. Pt has hx of alcoholism, and admits to drinking heavily today. He also reports having seizures in the past, and is not supposed to be on any medications. Pt is unaware why he is in the ED. He smells intoxicated, alert to self, place and year. Denies any pain anywhere. No headaches.  Patient is a 38 y.o. male presenting with seizures. The history is provided by the patient and medical records.  Seizures   Past Medical History  Diagnosis Date  . ETOH abuse   . Alcohol dependence 04/17/2012    Past Surgical History  Procedure Laterality Date  . Knee surgery    . Appendectomy      Family History  Problem Relation Age of Onset  . Cancer Mother     History  Substance Use Topics  . Smoking status: Current Every Day Smoker -- 1.00 packs/day  . Smokeless tobacco: Not on file  . Alcohol Use: Yes     Comment: 6 40-oz. beers daily      Review of Systems  Unable to perform ROS: Other  HENT: Negative for neck pain and neck stiffness.   Eyes: Negative for visual disturbance.  Respiratory: Negative for shortness of breath.   Cardiovascular: Negative for chest pain.  Gastrointestinal: Negative for abdominal pain.  Genitourinary: Negative for dysuria, enuresis and difficulty urinating.  Musculoskeletal: Negative for myalgias, back pain, joint swelling and arthralgias.  Skin: Negative for wound.  Neurological: Positive for seizures. Negative for headaches.  Hematological: Does not bruise/bleed easily.  Psychiatric/Behavioral: Negative for confusion.    Allergies  Review of patient's allergies indicates no known  allergies.  Home Medications  No current outpatient prescriptions on file.  BP 105/69  Pulse 67  Temp(Src) 98 F (36.7 C) (Oral)  Resp 18  SpO2 100%  Physical Exam  Nursing note and vitals reviewed. Constitutional: He is oriented to person, place, and time. He appears well-developed.  HENT:  Head: Normocephalic and atraumatic.  Eyes: Conjunctivae and EOM are normal. Pupils are equal, round, and reactive to light.  Neck: Normal range of motion. Neck supple.  Cardiovascular: Normal rate and regular rhythm.   Pulmonary/Chest: Effort normal and breath sounds normal.  Abdominal: Soft. Bowel sounds are normal. He exhibits no distension. There is no tenderness. There is no rebound and no guarding.  Musculoskeletal:  Head to toe evaluation shows no hematoma, bleeding of the scalp, no facial abrasions, step offs, crepitus, no tenderness to palpation of the bilateral upper and lower extremities, no gross deformities, no chest tenderness, no pelvic pain.  Neurological: He is alert and oriented to person, place, and time.  Skin: Skin is warm.    ED Course  Procedures (including critical care time)  Labs Reviewed  CBC - Abnormal; Notable for the following:    WBC 3.8 (*)    RDW 15.7 (*)    All other components within normal limits  BASIC METABOLIC PANEL - Abnormal; Notable for the following:    BUN 4 (*)    Calcium 8.2 (*)    All other components within normal limits  GLUCOSE, CAPILLARY  ETHANOL   No results found.  No diagnosis found.    MDM  DDx: Depression Bipolar disorder Schizophrenia Substance abuse Suicidal ideation Acute withdrawal  Pt comes in with cc of seizures. Has hx of alcoholism, and suspect that this is alcohol related event as well. Records show no meds, he denies being on any seizures meds either. Will hydrate, get basic labs and monitor for now.   Derwood Kaplan, MD 10/08/12 [phone]

## 2012-10-09 NOTE — ED Notes (Signed)
Pt able to ambulate without assistance 100 ft. Steady gait.

## 2012-10-09 NOTE — ED Provider Notes (Signed)
Care received from Dr. not abutting. Patient with alcohol toxic patient and reported seizure. Plan was for clinical sobriety and in relation in the morning and followup with neurology. Patient has been able to ambulate without difficulties on his own. We'll give him outpatient followup information.  Olivia Mackie, MD 10/09/12 [phone]

## 2013-08-19 ENCOUNTER — Emergency Department (HOSPITAL_COMMUNITY)
Admission: EM | Admit: 2013-08-19 | Discharge: 2013-08-19 | Discharge status: Against Medical Advice | Payer: Self-pay | Attending: Emergency Medicine | Admitting: Emergency Medicine

## 2013-08-19 ENCOUNTER — Encounter (HOSPITAL_COMMUNITY): Payer: Self-pay | Admitting: Emergency Medicine

## 2013-08-19 DIAGNOSIS — L0211 Cutaneous abscess of neck: Secondary | ICD-10-CM | POA: Insufficient documentation

## 2013-08-19 DIAGNOSIS — L0291 Cutaneous abscess, unspecified: Secondary | ICD-10-CM

## 2013-08-19 DIAGNOSIS — L03221 Cellulitis of neck: Principal | ICD-10-CM

## 2013-08-19 DIAGNOSIS — F17200 Nicotine dependence, unspecified, uncomplicated: Secondary | ICD-10-CM | POA: Insufficient documentation

## 2013-08-19 NOTE — ED Notes (Signed)
Pt left before he was seen by PA.

## 2013-08-19 NOTE — ED Provider Notes (Signed)
Patient left without being seen after triage. This provider walked into the patient's room-patient was not found. This provider waited to see if patient would return within 30 minutes. Patient did not return. This provider did not partake in any decision-making regarding the patient. Patient left without being assessed or examined.  Jamse Mead, PA-C 08/20/13 [phone]

## 2013-08-19 NOTE — ED Notes (Signed)
Pt. reports abscess at right lower /lateral neck for 1 month with no drainage , denies fever or chills. Respirations unlabored .

## 2013-08-21 ENCOUNTER — Emergency Department (HOSPITAL_COMMUNITY)
Admission: EM | Admit: 2013-08-21 | Discharge: 2013-08-21 | Disposition: A | Discharge status: Home / Self Care | Payer: Self-pay | Attending: Emergency Medicine | Admitting: Emergency Medicine

## 2013-08-21 ENCOUNTER — Encounter (HOSPITAL_COMMUNITY): Payer: Self-pay | Admitting: Emergency Medicine

## 2013-08-21 DIAGNOSIS — F10929 Alcohol use, unspecified with intoxication, unspecified: Secondary | ICD-10-CM

## 2013-08-21 DIAGNOSIS — L0390 Cellulitis, unspecified: Secondary | ICD-10-CM

## 2013-08-21 DIAGNOSIS — F17200 Nicotine dependence, unspecified, uncomplicated: Secondary | ICD-10-CM | POA: Insufficient documentation

## 2013-08-21 DIAGNOSIS — R059 Cough, unspecified: Secondary | ICD-10-CM | POA: Insufficient documentation

## 2013-08-21 DIAGNOSIS — L03221 Cellulitis of neck: Principal | ICD-10-CM

## 2013-08-21 DIAGNOSIS — R05 Cough: Secondary | ICD-10-CM | POA: Insufficient documentation

## 2013-08-21 DIAGNOSIS — F1010 Alcohol abuse, uncomplicated: Secondary | ICD-10-CM | POA: Insufficient documentation

## 2013-08-21 DIAGNOSIS — L0291 Cutaneous abscess, unspecified: Secondary | ICD-10-CM

## 2013-08-21 DIAGNOSIS — L0211 Cutaneous abscess of neck: Secondary | ICD-10-CM | POA: Insufficient documentation

## 2013-08-21 MED ORDER — SULFAMETHOXAZOLE-TRIMETHOPRIM 800-160 MG PO TABS: 1.0000 | ORAL_TABLET | Freq: Two times a day (BID) | ORAL | Status: DC

## 2013-08-21 NOTE — ED Notes (Addendum)
Pt reports abscess to right side of neck, no acute distress noted at triage. Was here on 1/2 for same but left ama. +etoh

## 2013-08-21 NOTE — ED Provider Notes (Signed)
CSN: [id number]     Arrival date & time 08/21/13  1752 History  This chart was scribed for non-physician practitioner, Clayton Bibles, PA-C,working with Orlie Dakin, MD, by Marlowe Kays, ED Scribe.  This patient was seen in room TR07C/TR07C and the patient's care was started at 7:20 PM.  Chief Complaint  Patient presents with  . Abscess   The history is provided by the patient. No language interpreter was used.   HPI Comments:  Marvin [name] is a 38 y.o. male who presents to the Emergency Department complaining of a painful, worsening abscess on the right side of his neck for approximately one month. Pt states it started as a small bump that has been getting bigger and more painful. He states he has recently had the flu and has been experiencing night sweats. He reports coughing, but that is normal at baseline. He denies h/o abscesses. He denies drainage, fever, or chills. He reports being a smoker. He reports drinking 3-4 40 ounce beers daily. Pt states he does not have a PCP.   Past Medical History  Diagnosis Date  . ETOH abuse   . Alcohol dependence 04/17/2012   Past Surgical History  Procedure Laterality Date  . Knee surgery    . Appendectomy     Family History  Problem Relation Age of Onset  . Cancer Mother    History  Substance Use Topics  . Smoking status: Current Every Day Smoker -- 1.00 packs/day  . Smokeless tobacco: Not on file  . Alcohol Use: Yes     Comment: 6 40-oz. beers daily    Review of Systems  Constitutional: Negative for fever and chills.  Respiratory: Positive for cough (normal at baseline).   Skin:       Abscess to right side of neck.     Allergies  Review of patient's allergies indicates no known allergies.  Home Medications  No current outpatient prescriptions on file. Triage Vitals: BP 115/70  Pulse 97  Temp(Src) 97.8 F (36.6 C) (Oral)  Resp 18  SpO2 95% Physical Exam  Nursing note and vitals reviewed. Constitutional: He appears  well-developed and well-nourished. No distress.  HENT:  Head: Normocephalic and atraumatic.  Neck: Neck supple.  Pulmonary/Chest: Effort normal.  Lymphadenopathy:       Right cervical: No superficial cervical, no deep cervical and no posterior cervical adenopathy present.      Right: No supraclavicular adenopathy present.  Neurological: He is alert.  Skin: He is not diaphoretic.  Superficial, raised, tender, indurated area with overlying erythema.    ED Course  Procedures (including critical care time) DIAGNOSTIC STUDIES: Oxygen Saturation is 95% on RA, adequate by my interpretation.   COORDINATION OF CARE: 7:25 PM- Will drain abscess. Pt verbalizes understanding and agrees to plan.  INCISION AND DRAINAGE Performed by: Clayton Bibles, PA-C Consent: Verbal consent obtained. Risks and benefits: risks, benefits and alternatives were discussed Type: abscess  Body area: right-sided base of neck  Anesthesia: local infiltration  Incision was made with a scalpel.  Local anesthetic: lidocaine 2% without epinephrine  Anesthetic total: 1 ml  Complexity: complex Blunt dissection to break up loculations superficially   Drainage: purulent  Drainage amount: large  Patient tolerance: Patient tolerated the procedure well with no immediate complications.   Medications - No data to display  Labs Review Labs Reviewed - No data to display Imaging Review No results found.  EKG Interpretation   None       MDM   1.  Abscess and cellulitis   2. Alcohol intoxication    Pt with superficial abscess and overlying cellulitis to base of right neck.  I&D performed with large amount of purulent drainage.  Superficial exploration for loculations with hemostat - I did not go deep into the area given the location and underlying structures.  D/C home with septra.  Resources for alcohol abuse treatment.  Discussed findings, treatment, and follow up  with patient.  Pt given return precautions.   Pt verbalizes understanding and agrees with plan.       I personally performed the services described in this documentation, which was scribed in my presence. The recorded information has been reviewed and is accurate.     Clayton Bibles, PA-C 08/21/13 2352

## 2013-08-21 NOTE — Discharge Instructions (Signed)
Read the information below.  Use the prescribed medication as directed.  Please discuss all new medications with your pharmacist.  You may return to the Emergency Department at any time for worsening condition or any new symptoms that concern you.  If you develop redness, swelling, pus draining from the wound, or fevers greater than 100.4, return to the ER immediately for a recheck.     Abscess An abscess (boil or furuncle) is an infected area on or under the skin. This area is filled with yellowish-white fluid (pus) and other material (debris). HOME CARE   Only take medicines as told by your doctor.  If you were given antibiotic medicine, take it as directed. Finish the medicine even if you start to feel better.  If gauze is used, follow your doctor's directions for changing the gauze.  To avoid spreading the infection:  Keep your abscess covered with a bandage.  Wash your hands well.  Do not share personal care items, towels, or whirlpools with others.  Avoid skin contact with others.  Keep your skin and clothes clean around the abscess.  Keep all doctor visits as told. GET HELP RIGHT AWAY IF:   You have more pain, puffiness (swelling), or redness in the wound site.  You have more fluid or blood coming from the wound site.  You have muscle aches, chills, or you feel sick.  You have a fever. MAKE SURE YOU:   Understand these instructions.  Will watch your condition.  Will get help right away if you are not doing well or get worse. Document Released: 01/21/2008 Document Revised: 02/03/2012 Document Reviewed: 10/17/2011 [facility] Patient Information 2014 [place].  Cellulitis Cellulitis is an infection of the skin and the tissue under the skin. The infected area is usually red and tender. This happens most often in the arms and lower legs. HOME CARE   Take your antibiotic medicine as told. Finish the medicine even if you start to feel better.  Keep the infected  arm or leg raised (elevated).  Put a warm cloth on the area up to 4 times per day.  Only take medicines as told by your doctor.  Keep all doctor visits as told. GET HELP RIGHT AWAY IF:   You have a fever.  You feel very sleepy.  You throw up (vomit) or have watery poop (diarrhea).  You feel sick and have muscle aches and pains.  You see red streaks on the skin coming from the infected area.  Your red area gets bigger or turns a dark color.  Your bone or joint under the infected area is painful after the skin heals.  Your infection comes back in the same area or different area.  You have a puffy (swollen) bump in the infected area.  You have new symptoms. MAKE SURE YOU:   Understand these instructions.  Will watch your condition.  Will get help right away if you are not doing well or get worse. Document Released: 01/21/2008 Document Revised: 02/03/2012 Document Reviewed: 10/20/2011 [facility] Patient Information 2014 [place], [place].   Behavioral Health Resources in the [facility]  Intensive Outpatient Programs: [facility]      [place]. [place], [place] Both a day and evening program       [facility] Outpatient     [address]        [place], [place] [address] [phone]         ADS:  Alcohol & Drug S[facility] [place]: [phone] or [phone] [address] [place], [place] [address] [email or web address]  Mobile Crisis Teams:                                        [facility]         Mobile Crisis Care Unit [phone]             [facility] [address] [place] [address] [place] [phone]  Self-Help/Support  Groups: [facility] [employer] [employer] of support groups [phone] (call for more info)  Narcotics Anonymous (NA) Caring Services [address] [place] - 2 meetings at this location  Residential Treatment Programs:  [place]       [place] [address] [place], [place]  [address] [place]  [address] [place], [place] [address] [phone] Admissions: 8am-3pm M-F  Incentives Substance [place]     [address] [place], [place] [address]       [phone]         [facility] [place] [address] Jadene Pierini [place], [place]  [facility] [address] [place], [place]  [facility] - Intensive Outpatient      [address] [place] [phone]     [place], [place]         [facility]     [place], [place] or [phone]  Residential Treatment Services (RTS)  [place], [place]  Fellowship [address]                                               [place]  [facility] Resources: [phone]               General Therapy                                                Domenic Schwab, PhD        [address] [place], [place] [address]         [place]   [address] [place], [place] [address] [phone]  [facility] Recovery [address] [place], [place] [address] [phone] Insurance/Medicaid/sponsorship through [facility] and Families                                              [address] [place]                                        [place], [place]  [address]    Therapy/tele-psych/case         [place] [address],   [address]  Adolescent/group home/case management [phone]                                           Rosette Reveal PhD       General therapy       Insurance   [phone]         Dr. Adele Schilder Insurance [phone] M-F  [place] [facility]/Residential Medicaid, sponsorship [phone]

## 2013-08-21 NOTE — ED Provider Notes (Signed)
Patient left without being seen  Carmin Muskrat, MD 08/21/13 0001

## 2013-08-22 NOTE — ED Provider Notes (Signed)
Medical screening examination/treatment/procedure(s) were performed by non-physician practitioner and as supervising physician I was immediately available for consultation/collaboration.  EKG Interpretation   None        Orlie Dakin, MD 08/22/13 [phone]

## 2014-10-09 ENCOUNTER — Emergency Department (HOSPITAL_COMMUNITY): Payer: Self-pay

## 2014-10-09 ENCOUNTER — Encounter (HOSPITAL_COMMUNITY): Payer: Self-pay | Admitting: Emergency Medicine

## 2014-10-09 ENCOUNTER — Emergency Department (HOSPITAL_COMMUNITY)
Admission: EM | Admit: 2014-10-09 | Discharge: 2014-10-09 | Disposition: A | Discharge status: Home / Self Care | Payer: Self-pay | Attending: Emergency Medicine | Admitting: Emergency Medicine

## 2014-10-09 DIAGNOSIS — W1839XA Other fall on same level, initial encounter: Secondary | ICD-10-CM | POA: Insufficient documentation

## 2014-10-09 DIAGNOSIS — S8992XA Unspecified injury of left lower leg, initial encounter: Secondary | ICD-10-CM | POA: Insufficient documentation

## 2014-10-09 DIAGNOSIS — Z720 Tobacco use: Secondary | ICD-10-CM | POA: Insufficient documentation

## 2014-10-09 DIAGNOSIS — M25562 Pain in left knee: Secondary | ICD-10-CM

## 2014-10-09 DIAGNOSIS — Y92009 Unspecified place in unspecified non-institutional (private) residence as the place of occurrence of the external cause: Secondary | ICD-10-CM | POA: Insufficient documentation

## 2014-10-09 DIAGNOSIS — Z792 Long term (current) use of antibiotics: Secondary | ICD-10-CM | POA: Insufficient documentation

## 2014-10-09 DIAGNOSIS — Y998 Other external cause status: Secondary | ICD-10-CM | POA: Insufficient documentation

## 2014-10-09 DIAGNOSIS — Y9301 Activity, walking, marching and hiking: Secondary | ICD-10-CM | POA: Insufficient documentation

## 2014-10-09 MED ORDER — HYDROCODONE-ACETAMINOPHEN 5-325 MG PO TABS: 1.0000 | ORAL_TABLET | ORAL | Status: DC | PRN

## 2014-10-09 MED ORDER — NAPROXEN 500 MG PO TABS: 500.0000 mg | ORAL_TABLET | Freq: Two times a day (BID) | ORAL | Status: DC

## 2014-10-09 MED ORDER — HYDROCODONE-ACETAMINOPHEN 5-325 MG PO TABS
1.0000 | ORAL_TABLET | Freq: Once | ORAL | Status: AC
  Administered 2014-10-09: 1 via ORAL
  Filled 2014-10-09: qty 1

## 2014-10-09 MED ORDER — NAPROXEN 500 MG PO TABS
500.0000 mg | ORAL_TABLET | Freq: Once | ORAL | Status: AC
  Administered 2014-10-09: 500 mg via ORAL
  Filled 2014-10-09: qty 1

## 2014-10-09 NOTE — Discharge Instructions (Signed)
Please follow the directions provided.  Be sure to follow-up with orthopedics to ensure your knee is getting better.  Take the naproxen twice a day for pain and inflammation.  Take the vicodin for pain not relieved by naproxen.  Rest your knee and use ice 20 min on/20 min off for comfort.  Wear your splint as needed.  Don't hesitate to return for any new, worsening or concerning symptoms.       SEEK MEDICAL CARE IF:  There is increased swelling in your knee.  You notice redness, swelling, or increasing pain in your knee.  An unexplained oral temperature above 102 F (38.9 C) develops. SEEK IMMEDIATE MEDICAL CARE IF:  You develop a rash.  You have difficulty breathing.  You have any allergic reactions from medications you may have been given.  There is severe pain with any motion of the knee.

## 2014-10-09 NOTE — ED Notes (Signed)
Bed: [facility] Expected date:  Expected time:  Means of arrival:  Comments: Ems- fall knee pain

## 2014-10-09 NOTE — ED Notes (Signed)
Brought in by EMS from home with complaint of left knee pain.  Pt reported that he fell last night while walking in the woods; he fell on his knees.  Pt has been having progressive left knee pain since his fall.  Pt has had hx of left knee surgery from a traumatic injury.

## 2014-10-09 NOTE — ED Provider Notes (Signed)
CSN: [id number]     Arrival date & time 10/09/14  1752 History   First MD Initiated Contact with Patient 10/09/14 1949     Chief Complaint  Patient presents with  . Fall  . Knee Pain    (Consider location/radiation/quality/duration/timing/severity/associated sxs/prior Treatment) HPI   Marvin [name] is a 39 yo male presenting with left knee pain. He states the pain started yesterday after he fell while walking in the woods.  He reports he has had surgery in that knee 5 years ago and his knee has been bothering him since the fall. He notes some swelling to his knee.  He describes the pain as aching and rates it as 8/10. He denies numbness or weakness.     Past Medical History  Diagnosis Date  . ETOH abuse   . Alcohol dependence 04/17/2012   Past Surgical History  Procedure Laterality Date  . Knee surgery    . Appendectomy     Family History  Problem Relation Age of Onset  . Cancer Mother    History  Substance Use Topics  . Smoking status: Current Every Day Smoker -- 1.00 packs/day  . Smokeless tobacco: Not on file  . Alcohol Use: Yes     Comment: 6 40-oz. beers daily    Review of Systems  Musculoskeletal: Positive for myalgias and arthralgias.  Neurological: Negative for weakness and numbness.      Allergies  Review of patient's allergies indicates no known allergies.  Home Medications   Prior to Admission medications   Medication Sig Start Date End Date Taking? Authorizing Provider  sulfamethoxazole-trimethoprim (SEPTRA DS) 800-160 MG per tablet Take 1 tablet by mouth 2 (two) times daily. Patient not taking: Reported on 10/09/2014 08/21/13   Clayton Bibles, PA-C   BP 122/79 mmHg  Pulse 82  Temp(Src) 98.3 F (36.8 C) (Oral)  Resp 16  SpO2 96% Physical Exam  Constitutional: He is oriented to person, place, and time. He appears well-developed and well-nourished. No distress.  HENT:  Head: Normocephalic and atraumatic.  Eyes: Conjunctivae are normal. Right eye  exhibits no discharge. Left eye exhibits no discharge. No scleral icterus.  Cardiovascular: Intact distal pulses.   Pulmonary/Chest: Effort normal.  Musculoskeletal: He exhibits tenderness.       Legs: Mild effusion noted to medial aspect left patella. No redness or warmth noted  Neurological: He is alert and oriented to person, place, and time. Coordination normal.  Skin: He is not diaphoretic.  Nursing note and vitals reviewed.   ED Course  Procedures (including critical care time) Labs Review Labs Reviewed - No data to display  Imaging Review Dg Knee Complete 4 Views Left  10/09/2014   CLINICAL DATA:  Fall landing on the anterior left knee. Knee pain with weight-bearing. Knee surgery 5 years ago.  EXAM: LEFT KNEE - COMPLETE 4+ VIEW  COMPARISON:  02/19/2009  FINDINGS: Articular space narrowing and spurring in the medial compartment.  Two lag screw fixation of the prior patellar fracture. The threaded portion of the more medial screw may be partially extending along the nonunited chronic fracture plane. Somewhat irregular resulting patellar morphology, elongated vertically. I do not observe a discrete acute/new fracture. Indistinctness of tissue planes along the suprapatellar bursa could reflect a small knee effusion.  IMPRESSION: 1. Nonunited transverse patellar fracture, with a slight gap which is traversed by lag screws, although one of the lag screws has a threaded portion which may partially extend into the fracture plane. 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 2. Medial compartmental loss of articular space and mild spurring is advanced for age. 3. Suspected small knee joint effusion in the suprapatellar bursa.   Electronically Signed   By: Van Clines M.D.   On: 10/09/2014 19:13     EKG Interpretation None      MDM   Final diagnoses:  Knee pain, acute, left   39 yo with knee pain after fall.  His  x-ray is negative for obvious fracture or dislocation. His pain was managed in ED. Knee splint provided in the ED Conservative therapy discussed. Referral for orthopedics provided for follow-up. Patient will be dc home & is agreeable with above plan.   Filed Vitals:   10/09/14 1756 10/09/14 2050  BP: 122/79 129/72  Pulse: 82 86  Temp: 98.3 F (36.8 C) 98.4 F (36.9 C)  TempSrc: Oral   Resp: 16 18  SpO2: 96% 98%   Meds given in ED:  Medications  HYDROcodone-acetaminophen (NORCO/VICODIN) 5-325 MG per tablet 1 tablet (1 tablet Oral Given 10/09/14 2048)  naproxen (NAPROSYN) tablet 500 mg (500 mg Oral Given 10/09/14 2048)    Discharge Medication List as of 10/09/2014  8:43 PM    START taking these medications   Details  HYDROcodone-acetaminophen (NORCO/VICODIN) 5-325 MG per tablet Take 1 tablet by mouth every 4 (four) hours as needed for moderate pain or severe pain., Starting 10/09/2014, Until Discontinued, Print    naproxen (NAPROSYN) 500 MG tablet Take 1 tablet (500 mg total) by mouth 2 (two) times daily., Starting 10/09/2014, Until Discontinued, Print           Britt Bottom, NP 10/10/14 Brenas Alvino Chapel, MD 10/10/14 1447

## 2015-01-21 ENCOUNTER — Emergency Department (HOSPITAL_COMMUNITY): Payer: Medicaid Other

## 2015-01-21 ENCOUNTER — Emergency Department (HOSPITAL_COMMUNITY)
Admission: EM | Admit: 2015-01-21 | Discharge: 2015-01-22 | Disposition: A | Discharge status: Home / Self Care | Payer: Medicaid Other | Attending: Emergency Medicine | Admitting: Emergency Medicine

## 2015-01-21 ENCOUNTER — Encounter (HOSPITAL_COMMUNITY): Payer: Self-pay | Admitting: Emergency Medicine

## 2015-01-21 DIAGNOSIS — J384 Edema of larynx: Secondary | ICD-10-CM | POA: Diagnosis not present

## 2015-01-21 DIAGNOSIS — Z720 Tobacco use: Secondary | ICD-10-CM | POA: Insufficient documentation

## 2015-01-21 DIAGNOSIS — Z79899 Other long term (current) drug therapy: Secondary | ICD-10-CM | POA: Insufficient documentation

## 2015-01-21 DIAGNOSIS — J387 Other diseases of larynx: Secondary | ICD-10-CM

## 2015-01-21 DIAGNOSIS — J449 Chronic obstructive pulmonary disease, unspecified: Secondary | ICD-10-CM | POA: Insufficient documentation

## 2015-01-21 DIAGNOSIS — M542 Cervicalgia: Secondary | ICD-10-CM | POA: Diagnosis present

## 2015-01-21 DIAGNOSIS — Z791 Long term (current) use of non-steroidal anti-inflammatories (NSAID): Secondary | ICD-10-CM | POA: Insufficient documentation

## 2015-01-21 HISTORY — DX: Chronic obstructive pulmonary disease, unspecified: J44.9

## 2015-01-21 LAB — I-STAT CHEM 8, ED
BUN: <3 mg/dL — ABNORMAL LOW (ref 6–20)
CALCIUM ION: 1.05 mmol/L — AB (ref 1.12–1.23)
CREATININE: 1.2 mg/dL (ref 0.61–1.24)
Chloride: 94 mmol/L — ABNORMAL LOW (ref 101–111)
Glucose, Bld: 90 mg/dL (ref 65–99)
HEMATOCRIT: 43 % (ref 39.0–52.0)
HEMOGLOBIN: 14.6 g/dL (ref 13.0–17.0)
Potassium: 3.6 mmol/L (ref 3.5–5.1)
SODIUM: 133 mmol/L — AB (ref 135–145)
TCO2: 19 mmol/L (ref 0–100)

## 2015-01-21 LAB — CBC WITH DIFFERENTIAL/PLATELET
BASOS ABS: 0.1 10*3/uL (ref 0.0–0.1)
BASOS PCT: 2 % — AB (ref 0–1)
EOS ABS: 0.1 10*3/uL (ref 0.0–0.7)
EOS PCT: 2 % (ref 0–5)
HCT: 36.6 % — ABNORMAL LOW (ref 39.0–52.0)
HEMOGLOBIN: 13.1 g/dL (ref 13.0–17.0)
LYMPHS ABS: 1.3 10*3/uL (ref 0.7–4.0)
Lymphocytes Relative: 32 % (ref 12–46)
MCH: 34.3 pg — ABNORMAL HIGH (ref 26.0–34.0)
MCHC: 35.8 g/dL (ref 30.0–36.0)
MCV: 95.8 fL (ref 78.0–100.0)
Monocytes Absolute: 0.6 10*3/uL (ref 0.1–1.0)
Monocytes Relative: 14 % — ABNORMAL HIGH (ref 3–12)
NEUTROS ABS: 2 10*3/uL (ref 1.7–7.7)
NEUTROS PCT: 50 % (ref 43–77)
Platelets: 143 10*3/uL — ABNORMAL LOW (ref 150–400)
RBC: 3.82 MIL/uL — AB (ref 4.22–5.81)
RDW: 13.3 % (ref 11.5–15.5)
WBC: 3.8 10*3/uL — ABNORMAL LOW (ref 4.0–10.5)

## 2015-01-21 LAB — RAPID STREP SCREEN (MED CTR MEBANE ONLY): Streptococcus, Group A Screen (Direct): NEGATIVE

## 2015-01-21 MED ORDER — IOHEXOL 300 MG/ML  SOLN
75.0000 mL | Freq: Once | INTRAMUSCULAR | Status: AC | PRN
  Administered 2015-01-21: 75 mL via INTRAVENOUS

## 2015-01-21 NOTE — ED Provider Notes (Signed)
CSN: [id number]     Arrival date & time 01/21/15  1922 History  This chart was scribed for Linton Flemings, MD by Delphia Grates, ED Scribe. This patient was seen in room B18C/B18C and the patient's care was started at 11:20 PM.   Chief Complaint  Patient presents with  . Neck Pain    The history is provided by the patient. No language interpreter was used.     HPI Comments: Marvin [name] is a 39 y.o. male who presents to the Emergency Department complaining of gradually worsening, 5/10, right neck pain for the past 2 months. There is associated pain with swallowing and change in voice phonation. He also reports minor weight loss that is possibly due to decreased food intake secondary to painful swallowing. He denies fever, chills,night sweats/diaphoresis. Patient is a current PPD smoker for the past 26 years.   Past Medical History  Diagnosis Date  . ETOH abuse   . Alcohol dependence 04/17/2012  . COPD (chronic obstructive pulmonary disease)    Past Surgical History  Procedure Laterality Date  . Knee surgery    . Appendectomy     Family History  Problem Relation Age of Onset  . Cancer Mother    History  Substance Use Topics  . Smoking status: Current Every Day Smoker -- 1.00 packs/day    Types: Cigarettes  . Smokeless tobacco: Not on file  . Alcohol Use: Yes     Comment: 6 40-oz. beers daily    Review of Systems  Constitutional: Positive for unexpected weight change. Negative for fever, chills and diaphoresis.  HENT: Positive for trouble swallowing (pain only).   Musculoskeletal: Positive for neck pain.      Allergies  Review of patient's allergies indicates no known allergies.  Home Medications   Prior to Admission medications   Medication Sig Start Date End Date Taking? Authorizing Provider  HYDROcodone-acetaminophen (NORCO/VICODIN) 5-325 MG per tablet Take 1 tablet by mouth every 4 (four) hours as needed for moderate pain or severe pain. 10/09/14   Britt Bottom, NP  naproxen (NAPROSYN) 500 MG tablet Take 1 tablet (500 mg total) by mouth 2 (two) times daily. 10/09/14   Britt Bottom, NP  sulfamethoxazole-trimethoprim (SEPTRA DS) 800-160 MG per tablet Take 1 tablet by mouth 2 (two) times daily. Patient not taking: Reported on 10/09/2014 08/21/13   Clayton Bibles, PA-C   Triage Vitals: BP 108/95 mmHg  Pulse 94  Temp(Src) 98.5 F (36.9 C) (Oral)  Resp 20  SpO2 95%  Physical Exam  Constitutional: He is oriented to person, place, and time. He appears well-developed and well-nourished. No distress.  HENT:  Head: Normocephalic and atraumatic.  Nose: Nose normal.  Mouth/Throat: Oropharynx is clear and moist.  Eyes: EOM are normal. Pupils are equal, round, and reactive to light.  Conjunctiva injected bilaterally  Neck: Normal range of motion. Neck supple. No JVD present. No tracheal deviation present. No thyromegaly present.  Right cervical lymphadenopathy.   Cardiovascular: Normal rate, regular rhythm, normal heart sounds and intact distal pulses.  Exam reveals no gallop and no friction rub.   No murmur heard. Pulmonary/Chest: Effort normal and breath sounds normal. No stridor. No respiratory distress. He has no wheezes. He has no rales. He exhibits no tenderness.  Increased expiratory phase  Abdominal: Soft. Bowel sounds are normal. He exhibits no distension and no mass. There is no tenderness. There is no rebound and no guarding.  Musculoskeletal: Normal range of motion. He exhibits no edema or tenderness.  Lymphadenopathy:    He has cervical adenopathy.  Neurological: He is alert and oriented to person, place, and time. He displays normal reflexes. He exhibits normal muscle tone. Coordination normal.  Skin: Skin is warm and dry. No rash noted. No erythema. No pallor.  Psychiatric: He has a normal mood and affect. His behavior is normal. Judgment and thought content normal.  Nursing note and vitals reviewed.   ED Course  Procedures  (including critical care time)  DIAGNOSTIC STUDIES: Oxygen Saturation is 95% on room air, adequate by my interpretation.    COORDINATION OF CARE: At 2324 Discussed treatment plan with patient which includes CT scan and CXR. Patient agrees.   Labs Review Labs Reviewed  CBC WITH DIFFERENTIAL/PLATELET - Abnormal; Notable for the following:    WBC 3.8 (*)    RBC 3.82 (*)    HCT 36.6 (*)    MCH 34.3 (*)    Platelets 143 (*)    Monocytes Relative 14 (*)    Basophils Relative 2 (*)    All other components within normal limits  I-STAT CHEM 8, ED - Abnormal; Notable for the following:    Sodium 133 (*)    Chloride 94 (*)    BUN <3 (*)    Calcium, Ion 1.05 (*)    All other components within normal limits  RAPID STREP SCREEN (NOT AT [facility])  CULTURE, GROUP A STREP    Imaging Review Dg Chest 2 View  01/22/2015   CLINICAL DATA:  Right-sided sore throat for 2 months. Difficulty swallowing. Chronic smoker.  EXAM: CHEST  2 VIEW  COMPARISON:  03/09/2010.  FINDINGS: The heart size and mediastinal contours are within normal limits. Severe COPD with hyperinflation. Stable radiopaque density in the anterior soft tissues projecting over the LEFT upper lobe. No osseous findings.  IMPRESSION: COPD.  No active disease.   Electronically Signed   By: Rolla Flatten M.D.   On: 01/22/2015 00:42   Ct Soft Tissue Neck W Contrast  01/22/2015   CLINICAL DATA:  Initial evaluation for 2 month history of right-sided sore throat, difficulty swallowing. Smoker.  EXAM: CT NECK WITH CONTRAST  TECHNIQUE: Multidetector CT imaging of the neck was performed using the standard protocol following the bolus administration of intravenous contrast.  CONTRAST:  61mL OMNIPAQUE IOHEXOL 300 MG/ML  SOLN  COMPARISON:  None.  FINDINGS: Visualized portions of the brain are unremarkable. Orbits are unremarkable.  Chronic moderate mucosal thickening present within the right maxillary sinus. Visualized paranasal sinuses are otherwise clear. No  mastoid effusion. Middle ear cavities are clear.  The salivary glands including the parotid glands and submandibular glands are normal.  The oral cavity is within normal limits. Base of tongue demonstrates no acute abnormality. Palatine tonsils within normal limits. Oropharynx and nasopharynx are clear. No retropharyngeal collection. Vallecula is clear.  There is an ill-defined heterogeneously enhancing mass centered at the right piriform sinus that measures approximately 3.2 x 2.9 x 3.8 cm (AP x transverse x craniocaudad) (series 2, image 81). The right piriform sinus is largely effaced. There is abnormal thickening and enhancement of the right aryepiglottic fold. The mass appears to infiltrate into the pre epiglottic fat anteriorly and laterally through the parapharyngeal fat towards the right carotid space. The mass extends inferiorly along the right posterolateral margin of the hypopharynx/supraglottic larynx to just above the right arytenoid cartilage. There is likely involvement of the right false cord and right laryngeal ventricle. The right true cord is not definitely involved, although this is  entirely possible due to the infiltrative nature of the lesion. Tumor and/or secretion present at the level of the left false cord as well.  There are several hyper enhancing right level IIa lymph nodes measuring up to 11 mm in short axis, concerning for nodal metastases. Central hypodensity within these nodes suggestive of necrosis. A few small hyper enhancing nodes measuring up to 5 mm slightly inferiorly at right level 2 are also suspected to be involved (series 2, image 6). No left-sided adenopathy identified.  Thyroid gland is normal.  Visualized superior mediastinum within normal limits.  Centrilobular and paraseptal emphysematous changes present within the visualized lung apices. Prominent bulla present at the right lung apex. No pulmonary nodule identified. Small 3 mm subpleural nodular density within the  peripheral left upper lobe on axial sequence appears more linear in nature on coronal sequence (series 2, image 140 for on axial sequence, series 5, image 80 on coronal sequence).  Normal intravascular enhancement seen within the neck.  No acute osseous abnormality. No worrisome lytic or blastic osseous lesion.  IMPRESSION: 1. Heterogeneously enhancing infiltrative mass centered at the right piriform sinus as above, worrisome for primary supraglottic carcinoma. 2. Enhancing right level 2 lymph nodes as above, worrisome for nodal metastases.   Electronically Signed   By: Jeannine Boga M.D.   On: 01/22/2015 04:16     EKG Interpretation None      MDM   Final diagnoses:  Supraglottic mass    39 year old male, 26-pack-year history with complaints of pain and swelling to right lateral neck over the last 2 months, increasing in pain, difficulties with swelling.  Concern for malignancy.  He also reports some change in voice.  Plan for CT soft tissue neck, chest x-ray.  I personally performed the services described in this documentation, which was scribed in my presence. The recorded information has been reviewed and is accurate.  CT scan shows an infiltrative mass at the right piriform sinus, worrisome for supraglottic carcinoma.  Findings were relayed to the patient.  He was given follow-up information for ENT for further evaluation and biopsy.  I have informed the patient that I am concerned that this is cancer.     Linton Flemings, MD 01/22/15 [phone]

## 2015-01-21 NOTE — ED Notes (Signed)
Patient with sore throat on the right side for two months.  Patient states that it comes back time after time.  He states that it does not feel like strep throat.  Patient states that he is a chronic smoker.

## 2015-01-22 MED ORDER — HYDROCODONE-ACETAMINOPHEN 7.5-325 MG/15ML PO SOLN: 15.0000 mL | Freq: Four times a day (QID) | ORAL | Status: DC | PRN

## 2015-01-22 NOTE — Discharge Instructions (Signed)
Your CT scan today showed a mass in your left throat concerning for cancer with spread to the lymph nodes.  It is very important to follow up with a local ENT doctor for further evaluation of this mass.  Contact the number above to set up an appointment.

## 2015-01-24 LAB — CULTURE, GROUP A STREP: Strep A Culture: NEGATIVE

## 2015-02-13 ENCOUNTER — Other Ambulatory Visit (HOSPITAL_COMMUNITY): Payer: Self-pay | Admitting: Otolaryngology

## 2015-02-14 ENCOUNTER — Encounter (HOSPITAL_COMMUNITY): Payer: Self-pay

## 2015-02-14 ENCOUNTER — Encounter (HOSPITAL_COMMUNITY)
Admission: RE | Admit: 2015-02-14 | Discharge: 2015-02-14 | Disposition: A | Discharge status: Home / Self Care | Payer: Medicaid Other | Source: Ambulatory Visit | Attending: Otolaryngology | Admitting: Otolaryngology

## 2015-02-14 ENCOUNTER — Other Ambulatory Visit (HOSPITAL_COMMUNITY): Payer: Self-pay | Admitting: Otolaryngology

## 2015-02-14 DIAGNOSIS — Z01818 Encounter for other preprocedural examination: Secondary | ICD-10-CM | POA: Insufficient documentation

## 2015-02-14 DIAGNOSIS — C12 Malignant neoplasm of pyriform sinus: Secondary | ICD-10-CM | POA: Insufficient documentation

## 2015-02-14 DIAGNOSIS — Z01812 Encounter for preprocedural laboratory examination: Secondary | ICD-10-CM | POA: Diagnosis not present

## 2015-02-14 DIAGNOSIS — J449 Chronic obstructive pulmonary disease, unspecified: Secondary | ICD-10-CM | POA: Diagnosis not present

## 2015-02-14 DIAGNOSIS — R9431 Abnormal electrocardiogram [ECG] [EKG]: Secondary | ICD-10-CM | POA: Diagnosis not present

## 2015-02-14 DIAGNOSIS — K219 Gastro-esophageal reflux disease without esophagitis: Secondary | ICD-10-CM | POA: Insufficient documentation

## 2015-02-14 HISTORY — DX: Gastro-esophageal reflux disease without esophagitis: K21.9

## 2015-02-14 HISTORY — DX: Unspecified convulsions: R56.9

## 2015-02-14 HISTORY — DX: Anxiety disorder, unspecified: F41.9

## 2015-02-14 HISTORY — DX: Reserved for inherently not codable concepts without codable children: IMO0001

## 2015-02-14 LAB — COMPREHENSIVE METABOLIC PANEL
ALT: 27 U/L (ref 17–63)
ANION GAP: 11 (ref 5–15)
AST: 56 U/L — ABNORMAL HIGH (ref 15–41)
Albumin: 3.3 g/dL — ABNORMAL LOW (ref 3.5–5.0)
Alkaline Phosphatase: 113 U/L (ref 38–126)
BUN: <5 mg/dL — ABNORMAL LOW (ref 6–20)
CALCIUM: 8.5 mg/dL — AB (ref 8.9–10.3)
CO2: 24 mmol/L (ref 22–32)
CREATININE: 0.46 mg/dL — AB (ref 0.61–1.24)
Chloride: 100 mmol/L — ABNORMAL LOW (ref 101–111)
Glucose, Bld: 81 mg/dL (ref 65–99)
Potassium: 4.2 mmol/L (ref 3.5–5.1)
Sodium: 135 mmol/L (ref 135–145)
Total Bilirubin: 0.4 mg/dL (ref 0.3–1.2)
Total Protein: 7.7 g/dL (ref 6.5–8.1)

## 2015-02-14 LAB — APTT: APTT: 35 s (ref 24–37)

## 2015-02-14 LAB — CBC
HCT: 39 % (ref 39.0–52.0)
Hemoglobin: 13.4 g/dL (ref 13.0–17.0)
MCH: 33.6 pg (ref 26.0–34.0)
MCHC: 34.4 g/dL (ref 30.0–36.0)
MCV: 97.7 fL (ref 78.0–100.0)
PLATELETS: 122 10*3/uL — AB (ref 150–400)
RBC: 3.99 MIL/uL — ABNORMAL LOW (ref 4.22–5.81)
RDW: 13.5 % (ref 11.5–15.5)
WBC: 4.4 10*3/uL (ref 4.0–10.5)

## 2015-02-14 LAB — PROTIME-INR
INR: 1.06 (ref 0.00–1.49)
Prothrombin Time: 14 seconds (ref 11.6–15.2)

## 2015-02-14 NOTE — Pre-Procedure Instructions (Addendum)
JOSEL KEO  02/14/2015      [id number] [place] [place] [facility] [place] [address] [place] [address] Phone: [phone] Fax: [phone]    Your procedure is scheduled on 02/22/15  Report to [facility] short stay admitting at 920 A.M.  Call this number if you have problems the morning of surgery:  [phone]   Remember:  Do not eat food or drink liquids after midnight.  Take these medicines the morning of surgery with A SIP OF WATER    STOP all herbel meds, nsaids (aleve,naproxen,advil,ibuprofen) 5 days prior to surgery starting 02/17/15 including vitamins, aspirin   Do not wear jewelry, make-up or nail polish.  Do not wear lotions, powders, or perfumes.  You may wear deodorant.  Do not shave 48 hours prior to surgery.  Men may shave face and neck.  Do not bring valuables to the hospital.  [facility] is not responsible for any belongings or valuables.  Contacts, dentures or bridgework may not be worn into surgery.  Leave your suitcase in the car.  After surgery it may be brought to your room.  For patients admitted to the hospital, discharge time will be determined by your treatment team.  Patients discharged the day of surgery will not be allowed to drive home.   Name and phone number of your driver:    Special instructions:   Special Instructions: [facility] - Preparing for Surgery  Before surgery, you can play an important role.  Because skin is not sterile, your skin needs to be as free of germs as possible.  You can reduce the number of germs on you skin by washing with CHG (chlorahexidine gluconate) soap before surgery.  CHG is an antiseptic cleaner which kills germs and bonds with the skin to continue killing germs even after washing.  Please DO NOT use if you have an allergy to CHG or antibacterial soaps.  If your skin becomes reddened/irritated stop using the CHG and inform your nurse when  you arrive at Short Stay.  Do not shave (including legs and underarms) for at least 48 hours prior to the first CHG shower.  You may shave your face.  Please follow these instructions carefully:   1.  Shower with CHG Soap the night before surgery and the morning of Surgery.  2.  If you choose to wash your hair, wash your hair first as usual with your normal shampoo.  3.  After you shampoo, rinse your hair and body thoroughly to remove the Shampoo.  4.  Use CHG as you would any other liquid soap.  You can apply chg directly  to the skin and wash gently with scrungie or a clean washcloth.  5.  Apply the CHG Soap to your body ONLY FROM THE NECK DOWN.  Do not use on open wounds or open sores.  Avoid contact with your eyes ears, mouth and genitals (private parts).  Wash genitals (private parts)       with your normal soap.  6.  Wash thoroughly, paying special attention to the area where your surgery will be performed.  7.  Thoroughly rinse your body with warm water from the neck down.  8.  DO NOT shower/wash with your normal soap after using and rinsing off the CHG Soap.  9.  Pat yourself dry with a clean towel.            10.  Wear clean pajamas.            11.  Place clean sheets on your bed the night of your first shower and do not sleep with pets.  Day of Surgery  Do not apply any lotions/deodorants the morning of surgery.  Please wear clean clothes to the hospital/surgery center.  Please read over the following fact sheets that you were given. Pain Booklet, Coughing and Deep Breathing and Surgical Site Infection Prevention

## 2015-02-14 NOTE — Progress Notes (Signed)
Patient inst to all 911 or have someone take to ed if unable to eat or have trouble breathing.  inst to try to decrease alcohol use before surgery if possible. States "helps pain"

## 2015-02-15 ENCOUNTER — Encounter (HOSPITAL_COMMUNITY): Payer: Self-pay

## 2015-02-15 NOTE — Progress Notes (Signed)
Anesthesia Chart Review: Patient is a 39 year old male scheduled for AWAKE tracheostomy, direct laryngoscopy with biopsy on 02/22/15 by Dr. Redmond Baseman. DX: pyrifrom sinus cancer.   Other history includes alcohol dependence, seizure suspected related to ETOH withdrawal 09/19/12, COPD, smoking, anxiety, GERD, appendectomy.   Of note, PAT RN reported that CMA felt patient was mildly intoxicated on arrival to PAT.  PAT RN reported patient able to answer questions appropriately. No slurred speech. His aunt brought him and confirmed she was driving. He admitted to drinking 2-40 oz beers that day--typically drinks six/day. He said ETOH was helping with pain. He and his aunt questioned if he was suppose to have a MRI after his PAT visit. There was no order or appointment noted in Epic for an MRI. His PAT RN contacted Dr. Redmond Baseman' office who also confirmed they were unaware of a scheduled MRI.  Patient reported continued dysphagia. No acute SOB. His PAT RN reported that patient did not appear in any respiratory distress, no audible wheezing or conversational dyspnea noted. His surgery is not until 02/22/15, so he was advised that if he did developed significant dysphagia or respiratory symptoms then he should seek immediate evaluation in the ED.  Preoperative EKG done due to ETOH abuse history. 02/14/15 EKG: NSR, cannot rule out anterior infarct (age undetermined). R wave is lower in V3 when compared to 10/08/14 tracing.   01/22/15 CT neck: IMPRESSION: 1. Heterogeneously enhancing infiltrative mass centered at the right piriform sinus as above, worrisome for primary supraglottic carcinoma. 2. Enhancing right level 2 lymph nodes as above, worrisome for nodal metastases.  Preoperative labs noted. LFTs and PT/PTT done due to ETOH abuse history. Labs appear acceptable for OR.   Anticipate that he can proceed. Consider DT prophylaxis depending on his planned length of stay.  Further evaluation by his anesthesiologist on the day of  surgery.  George Hugh [facility] Short Stay Center/Anesthesiology Phone [phone] 02/15/2015 11:49 AM

## 2015-02-22 ENCOUNTER — Encounter (HOSPITAL_COMMUNITY): Payer: Self-pay | Admitting: *Deleted

## 2015-02-22 ENCOUNTER — Inpatient Hospital Stay (HOSPITAL_COMMUNITY)
Admission: RE | Admit: 2015-02-22 | Discharge: 2015-03-08 | DRG: 011 | Disposition: A | Discharge status: Skilled Nursing Facility | Payer: Medicaid Other | Source: Ambulatory Visit | Attending: Otolaryngology | Admitting: Otolaryngology

## 2015-02-22 ENCOUNTER — Inpatient Hospital Stay (HOSPITAL_COMMUNITY): Payer: Medicaid Other | Admitting: Vascular Surgery

## 2015-02-22 ENCOUNTER — Inpatient Hospital Stay (HOSPITAL_COMMUNITY): Payer: Medicaid Other | Admitting: Anesthesiology

## 2015-02-22 ENCOUNTER — Encounter (HOSPITAL_COMMUNITY)
Admission: RE | Disposition: A | Discharge status: Skilled Nursing Facility | Payer: Self-pay | Source: Ambulatory Visit | Attending: Otolaryngology

## 2015-02-22 DIAGNOSIS — K045 Chronic apical periodontitis: Secondary | ICD-10-CM | POA: Diagnosis present

## 2015-02-22 DIAGNOSIS — F1020 Alcohol dependence, uncomplicated: Secondary | ICD-10-CM | POA: Diagnosis present

## 2015-02-22 DIAGNOSIS — G893 Neoplasm related pain (acute) (chronic): Secondary | ICD-10-CM | POA: Diagnosis not present

## 2015-02-22 DIAGNOSIS — R1310 Dysphagia, unspecified: Secondary | ICD-10-CM

## 2015-02-22 DIAGNOSIS — Z79899 Other long term (current) drug therapy: Secondary | ICD-10-CM

## 2015-02-22 DIAGNOSIS — K219 Gastro-esophageal reflux disease without esophagitis: Secondary | ICD-10-CM | POA: Diagnosis present

## 2015-02-22 DIAGNOSIS — E46 Unspecified protein-calorie malnutrition: Secondary | ICD-10-CM

## 2015-02-22 DIAGNOSIS — Z681 Body mass index (BMI) 19 or less, adult: Secondary | ICD-10-CM | POA: Diagnosis not present

## 2015-02-22 DIAGNOSIS — J449 Chronic obstructive pulmonary disease, unspecified: Secondary | ICD-10-CM | POA: Diagnosis present

## 2015-02-22 DIAGNOSIS — F17210 Nicotine dependence, cigarettes, uncomplicated: Secondary | ICD-10-CM | POA: Diagnosis present

## 2015-02-22 DIAGNOSIS — T17908A Unspecified foreign body in respiratory tract, part unspecified causing other injury, initial encounter: Secondary | ICD-10-CM

## 2015-02-22 DIAGNOSIS — J189 Pneumonia, unspecified organism: Secondary | ICD-10-CM | POA: Diagnosis not present

## 2015-02-22 DIAGNOSIS — C321 Malignant neoplasm of supraglottis: Secondary | ICD-10-CM | POA: Diagnosis present

## 2015-02-22 DIAGNOSIS — K029 Dental caries, unspecified: Secondary | ICD-10-CM | POA: Diagnosis present

## 2015-02-22 DIAGNOSIS — D630 Anemia in neoplastic disease: Secondary | ICD-10-CM | POA: Diagnosis present

## 2015-02-22 DIAGNOSIS — Z79891 Long term (current) use of opiate analgesic: Secondary | ICD-10-CM | POA: Diagnosis not present

## 2015-02-22 DIAGNOSIS — E43 Unspecified severe protein-calorie malnutrition: Secondary | ICD-10-CM | POA: Diagnosis present

## 2015-02-22 DIAGNOSIS — F419 Anxiety disorder, unspecified: Secondary | ICD-10-CM | POA: Diagnosis present

## 2015-02-22 DIAGNOSIS — R110 Nausea: Secondary | ICD-10-CM | POA: Diagnosis not present

## 2015-02-22 DIAGNOSIS — K089 Disorder of teeth and supporting structures, unspecified: Secondary | ICD-10-CM

## 2015-02-22 DIAGNOSIS — K0530 Chronic periodontitis, unspecified: Secondary | ICD-10-CM | POA: Diagnosis present

## 2015-02-22 DIAGNOSIS — K083 Retained dental root: Secondary | ICD-10-CM | POA: Diagnosis present

## 2015-02-22 DIAGNOSIS — Z791 Long term (current) use of non-steroidal anti-inflammatories (NSAID): Secondary | ICD-10-CM

## 2015-02-22 DIAGNOSIS — E440 Moderate protein-calorie malnutrition: Secondary | ICD-10-CM | POA: Insufficient documentation

## 2015-02-22 DIAGNOSIS — R509 Fever, unspecified: Secondary | ICD-10-CM

## 2015-02-22 DIAGNOSIS — R220 Localized swelling, mass and lump, head: Secondary | ICD-10-CM | POA: Diagnosis present

## 2015-02-22 DIAGNOSIS — R070 Pain in throat: Secondary | ICD-10-CM | POA: Diagnosis not present

## 2015-02-22 HISTORY — PX: DIRECT LARYNGOSCOPY: SHX5326

## 2015-02-22 HISTORY — PX: TRACHEOSTOMY TUBE PLACEMENT: SHX814

## 2015-02-22 LAB — MRSA PCR SCREENING: MRSA by PCR: NEGATIVE

## 2015-02-22 LAB — CREATININE, SERUM
Creatinine, Ser: 0.65 mg/dL (ref 0.61–1.24)
GFR calc non Af Amer: >60 mL/min (ref >60)

## 2015-02-22 LAB — CBC
HCT: 38.5 % — ABNORMAL LOW (ref 39.0–52.0)
Hemoglobin: 13.4 g/dL (ref 13.0–17.0)
MCH: 34.3 pg — AB (ref 26.0–34.0)
MCHC: 34.8 g/dL (ref 30.0–36.0)
MCV: 98.5 fL (ref 78.0–100.0)
Platelets: 111 10*3/uL — ABNORMAL LOW (ref 150–400)
RBC: 3.91 MIL/uL — ABNORMAL LOW (ref 4.22–5.81)
RDW: 13.9 % (ref 11.5–15.5)
WBC: 5.8 10*3/uL (ref 4.0–10.5)

## 2015-02-22 SURGERY — CREATION, TRACHEOSTOMY
Anesthesia: General | Site: Throat

## 2015-02-22 MED ORDER — FOLIC ACID 1 MG PO TABS
1.0000 mg | ORAL_TABLET | Freq: Every day | ORAL | Status: DC
Start: 2015-02-22 — End: 2015-02-23
  Filled 2015-02-22 (×2): qty 1

## 2015-02-22 MED ORDER — PROPOFOL 10 MG/ML IV BOLUS
INTRAVENOUS | Status: AC
  Filled 2015-02-22: qty 20

## 2015-02-22 MED ORDER — LIDOCAINE-EPINEPHRINE 1 %-1:100000 IJ SOLN
INTRAMUSCULAR | Status: DC | PRN
  Administered 2015-02-22: 20 mL

## 2015-02-22 MED ORDER — MORPHINE SULFATE 2 MG/ML IJ SOLN
2.0000 mg | INTRAMUSCULAR | Status: DC | PRN
  Administered 2015-02-22 – 2015-02-26 (×10): 2 mg via INTRAVENOUS
  Administered 2015-02-26: 4 mg via INTRAVENOUS
  Administered 2015-02-27: 2 mg via INTRAVENOUS
  Administered 2015-02-27: 4 mg via INTRAVENOUS
  Administered 2015-02-28 – 2015-03-04 (×15): 2 mg via INTRAVENOUS
  Administered 2015-03-05 (×2): 4 mg via INTRAVENOUS
  Administered 2015-03-05 – 2015-03-07 (×6): 2 mg via INTRAVENOUS
  Filled 2015-02-22 (×5): qty 1
  Filled 2015-02-22: qty 2
  Filled 2015-02-22 (×2): qty 1
  Filled 2015-02-22: qty 2
  Filled 2015-02-22 (×6): qty 1
  Filled 2015-02-22: qty 2
  Filled 2015-02-22 (×12): qty 1
  Filled 2015-02-22: qty 2
  Filled 2015-02-22 (×4): qty 1
  Filled 2015-02-22: qty 2
  Filled 2015-02-22 (×3): qty 1

## 2015-02-22 MED ORDER — LIDOCAINE-EPINEPHRINE 1 %-1:100000 IJ SOLN
INTRAMUSCULAR | Status: AC
  Filled 2015-02-22: qty 1

## 2015-02-22 MED ORDER — HYDROMORPHONE HCL 1 MG/ML IJ SOLN: 0.2500 mg | INTRAMUSCULAR | Status: DC | PRN

## 2015-02-22 MED ORDER — PROMETHAZINE HCL 25 MG RE SUPP
12.5000 mg | Freq: Four times a day (QID) | RECTAL | Status: DC | PRN
  Administered 2015-02-22: 12.5 mg via RECTAL
  Filled 2015-02-22: qty 1

## 2015-02-22 MED ORDER — OXYMETAZOLINE HCL 0.05 % NA SOLN
NASAL | Status: AC
  Filled 2015-02-22: qty 15

## 2015-02-22 MED ORDER — 0.9 % SODIUM CHLORIDE (POUR BTL) OPTIME
TOPICAL | Status: DC | PRN
Start: 2015-02-22 — End: 2015-02-22
  Administered 2015-02-22: 1000 mL

## 2015-02-22 MED ORDER — EPHEDRINE SULFATE 50 MG/ML IJ SOLN
INTRAMUSCULAR | Status: DC | PRN
  Administered 2015-02-22 (×2): 5 mg via INTRAVENOUS

## 2015-02-22 MED ORDER — FENTANYL CITRATE (PF) 250 MCG/5ML IJ SOLN
INTRAMUSCULAR | Status: AC
  Filled 2015-02-22: qty 5

## 2015-02-22 MED ORDER — KETAMINE HCL 10 MG/ML IJ SOLN
INTRAMUSCULAR | Status: DC | PRN
  Administered 2015-02-22: 40 mg via INTRAVENOUS

## 2015-02-22 MED ORDER — KCL IN DEXTROSE-NACL 20-5-0.45 MEQ/L-%-% IV SOLN
INTRAVENOUS | Status: DC
  Administered 2015-02-22 – 2015-02-23 (×2): 100 mL/h via INTRAVENOUS
  Administered 2015-02-24: 02:00:00 via INTRAVENOUS
  Filled 2015-02-22 (×7): qty 1000

## 2015-02-22 MED ORDER — PROMETHAZINE HCL 25 MG PO TABS
12.5000 mg | ORAL_TABLET | Freq: Four times a day (QID) | ORAL | Status: DC | PRN
  Filled 2015-02-22: qty 1

## 2015-02-22 MED ORDER — LIDOCAINE HCL (CARDIAC) 20 MG/ML IV SOLN
INTRAVENOUS | Status: DC | PRN
  Administered 2015-02-22: 100 mg via INTRAVENOUS

## 2015-02-22 MED ORDER — SUCCINYLCHOLINE CHLORIDE 20 MG/ML IJ SOLN
INTRAMUSCULAR | Status: DC | PRN
  Administered 2015-02-22: 60 mg via INTRAVENOUS

## 2015-02-22 MED ORDER — VITAMIN B-1 100 MG PO TABS
100.0000 mg | ORAL_TABLET | Freq: Every day | ORAL | Status: DC
  Administered 2015-02-26 – 2015-03-08 (×5): 100 mg via ORAL
  Filled 2015-02-22 (×9): qty 1

## 2015-02-22 MED ORDER — PROMETHAZINE HCL 25 MG/ML IJ SOLN: 6.2500 mg | INTRAMUSCULAR | Status: DC | PRN

## 2015-02-22 MED ORDER — HYDROMORPHONE HCL 1 MG/ML IJ SOLN
INTRAMUSCULAR | Status: AC
  Administered 2015-02-22 (×2): 0.5 mg
  Filled 2015-02-22: qty 2

## 2015-02-22 MED ORDER — KETAMINE HCL 10 MG/ML IJ SOLN
0.5000 mg/kg | INTRAMUSCULAR | Status: DC
  Filled 2015-02-22: qty 12.8

## 2015-02-22 MED ORDER — PROPOFOL 10 MG/ML IV BOLUS
INTRAVENOUS | Status: DC | PRN
  Administered 2015-02-22 (×2): 50 mg via INTRAVENOUS
  Administered 2015-02-22: 100 mg via INTRAVENOUS

## 2015-02-22 MED ORDER — LACTATED RINGERS IV SOLN
INTRAVENOUS | Status: DC
  Administered 2015-02-22 (×2): via INTRAVENOUS

## 2015-02-22 MED ORDER — OXYMETAZOLINE HCL 0.05 % NA SOLN
NASAL | Status: DC | PRN
  Administered 2015-02-22: 1

## 2015-02-22 MED ORDER — THIAMINE HCL 100 MG/ML IJ SOLN
100.0000 mg | Freq: Every day | INTRAMUSCULAR | Status: DC
  Administered 2015-02-22 – 2015-03-02 (×8): 100 mg via INTRAVENOUS
  Filled 2015-02-22: qty 1
  Filled 2015-02-22 (×2): qty 2
  Filled 2015-02-22: qty 1
  Filled 2015-02-22: qty 2
  Filled 2015-02-22: qty 1
  Filled 2015-02-22: qty 2
  Filled 2015-02-22 (×2): qty 1

## 2015-02-22 MED ORDER — ADULT MULTIVITAMIN W/MINERALS CH
1.0000 | ORAL_TABLET | Freq: Every day | ORAL | Status: DC
  Administered 2015-02-24 – 2015-03-08 (×8): 1 via ORAL
  Filled 2015-02-22 (×10): qty 1

## 2015-02-22 MED ORDER — EPINEPHRINE HCL (NASAL) 0.1 % NA SOLN
NASAL | Status: AC
  Filled 2015-02-22: qty 30

## 2015-02-22 MED ORDER — MIDAZOLAM HCL 5 MG/5ML IJ SOLN
INTRAMUSCULAR | Status: DC | PRN
  Administered 2015-02-22 (×2): 1 mg via INTRAVENOUS

## 2015-02-22 MED ORDER — LORAZEPAM 1 MG PO TABS: 1.0000 mg | ORAL_TABLET | Freq: Four times a day (QID) | ORAL | Status: DC | PRN

## 2015-02-22 MED ORDER — NICOTINE 21 MG/24HR TD PT24
21.0000 mg | MEDICATED_PATCH | Freq: Every day | TRANSDERMAL | Status: DC
  Administered 2015-02-22 – 2015-03-08 (×15): 21 mg via TRANSDERMAL
  Filled 2015-02-22 (×15): qty 1

## 2015-02-22 MED ORDER — HYDROCODONE-ACETAMINOPHEN 7.5-325 MG/15ML PO SOLN
15.0000 mL | Freq: Four times a day (QID) | ORAL | Status: DC | PRN
  Administered 2015-03-01 – 2015-03-08 (×6): 15 mL via ORAL
  Filled 2015-02-22 (×7): qty 15

## 2015-02-22 MED ORDER — LORAZEPAM 2 MG/ML IJ SOLN
2.0000 mg | INTRAMUSCULAR | Status: DC | PRN
  Administered 2015-02-22 – 2015-03-07 (×25): 2 mg via INTRAVENOUS
  Filled 2015-02-22 (×27): qty 1

## 2015-02-22 MED ORDER — CEFAZOLIN SODIUM-DEXTROSE 2-3 GM-% IV SOLR
INTRAVENOUS | Status: DC | PRN
  Administered 2015-02-22: 2 g via INTRAVENOUS

## 2015-02-22 MED ORDER — EPINEPHRINE HCL (NASAL) 0.1 % NA SOLN
NASAL | Status: DC | PRN
  Administered 2015-02-22: 1 [drp] via NASAL

## 2015-02-22 MED ORDER — HEPARIN SODIUM (PORCINE) 5000 UNIT/ML IJ SOLN
5000.0000 [IU] | Freq: Three times a day (TID) | INTRAMUSCULAR | Status: DC
  Administered 2015-02-22 – 2015-02-26 (×10): 5000 [IU] via SUBCUTANEOUS
  Filled 2015-02-22 (×15): qty 1

## 2015-02-22 MED ORDER — LORAZEPAM 2 MG/ML IJ SOLN: 1.0000 mg | Freq: Four times a day (QID) | INTRAMUSCULAR | Status: DC | PRN

## 2015-02-22 MED ORDER — MIDAZOLAM HCL 2 MG/2ML IJ SOLN
INTRAMUSCULAR | Status: AC
  Filled 2015-02-22: qty 2

## 2015-02-22 SURGICAL SUPPLY — 54 items
BALLN PULM 15 16.5 18 X 75CM (BALLOONS)
BALLN PULM 15 16.5 18X75 (BALLOONS)
BALLOON PULM 15 16.5 18X75 (BALLOONS) IMPLANT
BLADE SURG 15 STRL LF DISP TIS (BLADE) IMPLANT
BLADE SURG 15 STRL SS (BLADE)
BLADE SURG ROTATE 9660 (MISCELLANEOUS) IMPLANT
CANISTER SUCTION 2500CC (MISCELLANEOUS) ×3 IMPLANT
CLEANER TIP ELECTROSURG 2X2 (MISCELLANEOUS) ×3 IMPLANT
CONT SPEC 4OZ CLIKSEAL STRL BL (MISCELLANEOUS) IMPLANT
COVER MAYO STAND STRL (DRAPES) ×3 IMPLANT
COVER SURGICAL LIGHT HANDLE (MISCELLANEOUS) ×3 IMPLANT
COVER TABLE BACK 60X90 (DRAPES) ×3 IMPLANT
CRADLE DONUT ADULT HEAD (MISCELLANEOUS) IMPLANT
DECANTER SPIKE VIAL GLASS SM (MISCELLANEOUS) ×3 IMPLANT
DRAPE PROXIMA HALF (DRAPES) ×3 IMPLANT
ELECT COATED BLADE 2.86 ST (ELECTRODE) ×3 IMPLANT
ELECT REM PT RETURN 9FT ADLT (ELECTROSURGICAL) ×3
ELECTRODE REM PT RTRN 9FT ADLT (ELECTROSURGICAL) ×1 IMPLANT
GAUZE SPONGE 4X4 16PLY XRAY LF (GAUZE/BANDAGES/DRESSINGS) ×3 IMPLANT
GLOVE BIO SURGEON STRL SZ7.5 (GLOVE) ×3 IMPLANT
GLOVE BIOGEL PI IND STRL 7.5 (GLOVE) ×1 IMPLANT
GLOVE BIOGEL PI INDICATOR 7.5 (GLOVE) ×2
GLOVE SURG SS PI 7.5 STRL IVOR (GLOVE) ×3 IMPLANT
GOWN STRL REUS W/ TWL LRG LVL3 (GOWN DISPOSABLE) ×2 IMPLANT
GOWN STRL REUS W/TWL LRG LVL3 (GOWN DISPOSABLE) ×4
GUARD TEETH (MISCELLANEOUS) ×3 IMPLANT
HOLDER TRACH TUBE VELCRO 19.5 (MISCELLANEOUS) ×3 IMPLANT
KIT BASIN OR (CUSTOM PROCEDURE TRAY) ×3 IMPLANT
KIT ROOM TURNOVER OR (KITS) ×3 IMPLANT
KIT SUCTION CATH 14FR (SUCTIONS) IMPLANT
NEEDLE HYPO 25GX1X1/2 BEV (NEEDLE) ×6 IMPLANT
NEEDLE TRANS ORAL INJECTION (NEEDLE) IMPLANT
NS IRRIG 1000ML POUR BTL (IV SOLUTION) ×3 IMPLANT
PACK EENT II TURBAN DRAPE (CUSTOM PROCEDURE TRAY) ×3 IMPLANT
PAD ARMBOARD 7.5X6 YLW CONV (MISCELLANEOUS) ×6 IMPLANT
PATTIES SURGICAL .5 X3 (DISPOSABLE) ×3 IMPLANT
PENCIL BUTTON HOLSTER BLD 10FT (ELECTRODE) ×3 IMPLANT
SOLUTION ANTI FOG 6CC (MISCELLANEOUS) IMPLANT
SPONGE DRAIN TRACH 4X4 STRL 2S (GAUZE/BANDAGES/DRESSINGS) ×3 IMPLANT
SPONGE INTESTINAL PEANUT (DISPOSABLE) IMPLANT
SURGILUBE 2OZ TUBE FLIPTOP (MISCELLANEOUS) IMPLANT
SUT MON AB 3-0 SH 27 (SUTURE) ×2
SUT MON AB 3-0 SH27 (SUTURE) ×1 IMPLANT
SUT SILK 0 FSL (SUTURE) ×3 IMPLANT
SUT SILK 2 0 REEL (SUTURE) ×3 IMPLANT
SUT SILK 2 0 SH CR/8 (SUTURE) ×3 IMPLANT
SUT VIC AB 2-0 FS1 27 (SUTURE) IMPLANT
SYR 20ML ECCENTRIC (SYRINGE) ×3 IMPLANT
SYR BULB 3OZ (MISCELLANEOUS) ×3 IMPLANT
SYR CONTROL 10ML LL (SYRINGE) ×6 IMPLANT
TOWEL OR 17X24 6PK STRL BLUE (TOWEL DISPOSABLE) ×6 IMPLANT
TUBE CONNECTING 12'X1/4 (SUCTIONS) ×1
TUBE CONNECTING 12X1/4 (SUCTIONS) ×2 IMPLANT
WATER STERILE IRR 1000ML POUR (IV SOLUTION) ×3 IMPLANT

## 2015-02-22 NOTE — Brief Op Note (Signed)
02/22/2015  12:34 PM  PATIENT:  [name]  39 y.o. male  PRE-OPERATIVE DIAGNOSIS:  pyriform sinus cancer  POST-OPERATIVE DIAGNOSIS:  Right supraglottic laryngeal cancer  PROCEDURE:  Procedure(s) with comments: TRACHEOSTOMY (N/A) - awake tracheostomy DIRECT LARYNGOSCOPY (N/A) - direct laryngoscopy with biopsy  SURGEON:  Surgeon(s) and Role:    * [name], MD - Primary  PHYSICIAN ASSISTANT:   ASSISTANTS: none   ANESTHESIA:   general  EBL:     BLOOD ADMINISTERED:none  DRAINS: none   LOCAL MEDICATIONS USED:  LIDOCAINE   SPECIMEN:  Source of Specimen:  right pharyngoepiglottic fold, right lateral arytenoid, and right false cord  DISPOSITION OF SPECIMEN:  PATHOLOGY  COUNTS:  YES  TOURNIQUET:  * No tourniquets in log *  DICTATION: .Other Dictation: Dictation Number [id number]  PLAN OF CARE: Admit to inpatient   PATIENT DISPOSITION:  PACU - hemodynamically stable.   Delay start of Pharmacological VTE agent (>24hrs) due to surgical blood loss or risk of bleeding: no

## 2015-02-22 NOTE — Anesthesia Postprocedure Evaluation (Signed)
  Anesthesia Post-op Note  Patient: Marvin [name]  Procedure(s) Performed: Procedure(s) with comments: TRACHEOSTOMY (N/A) - awake tracheostomy DIRECT LARYNGOSCOPY (N/A) - direct laryngoscopy with biopsy  Patient Location: PACU  Anesthesia Type:MAC and General  Level of Consciousness: awake and alert   Airway and Oxygen Therapy: Patient Spontanous Breathing  Post-op Pain: mild  Post-op Assessment: Post-op Vital signs reviewed and Respiratory Function Stable              Post-op Vital Signs: stable  Last Vitals:  Filed Vitals:   02/22/15 1345  BP: 102/74  Pulse: 57  Temp:   Resp: 10    Complications: No apparent anesthesia complications

## 2015-02-22 NOTE — Progress Notes (Signed)
Patient ID: Marvin [name], male   DOB: 1976-01-10, 39 y.o.   MRN: [id number] Resting comfortably.  No complaints. AFVSS Sleepy.  Breathing comfortably. Trach site stable with #6 cuffed tube in place, no significant bleeding. A/P: Right supraglottic carcinoma, partial airway obstruction s/p tracheostomy and laryngoscopy with biopsy; alcohol and nicotine abuse Observe overnight in stepdown.  Plan to deflate cuff on tracheostomy tomorrow.  On alcohol withdrawal protocol.

## 2015-02-22 NOTE — H&P (Signed)
Marvin [name] is an 39 y.o. male.   Chief Complaint: right pyriform sinus mass HPI: 39 year old male with over two months of throat pain that has worsened and is associated with dysphagia and muffled voice.  He feels some difficulty breathing at times.  He has smoked for a long time and used alcohol heavily.  He has lost some weight.  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  Past Medical History  Diagnosis Date  . ETOH abuse   . Alcohol dependence 04/17/2012  . COPD (chronic obstructive pulmonary disease)   . Shortness of breath dyspnea     occ  . Anxiety   . GERD (gastroesophageal reflux disease)   . Seizures     one episode 09/2012, suspected related to ETOH withdrawal    Past Surgical History  Procedure Laterality Date  . Knee surgery Left     kneecap -screws  . Appendectomy      Family History  Problem Relation Age of Onset  . Cancer Mother    Social History:  reports that he has been smoking Cigarettes.  He has a 28 pack-year smoking history. He has never used smokeless tobacco. He reports that he drinks alcohol. He reports that he does not use illicit drugs.  Allergies: No Known Allergies  Medications Prior to Admission  Medication Sig Dispense Refill  . HYDROcodone-acetaminophen (HYCET) 7.5-325 mg/15 ml solution Take 15 mLs by mouth 4 (four) times daily as needed for moderate pain. 120 mL 0  . naproxen (NAPROSYN) 500 MG tablet Take 1 tablet (500 mg total) by mouth 2 (two) times daily. (Patient not taking: Reported on 02/14/2015) 30 tablet 0  . sulfamethoxazole-trimethoprim (SEPTRA DS) 800-160 MG per tablet Take 1 tablet by mouth 2 (two) times daily. (Patient not taking: Reported on 10/09/2014) 14 tablet 0    No results found for this or any previous visit (from the past 48 hour(s)). No results found.  Review of Systems  HENT: Positive for sore  throat.   Gastrointestinal: Positive for heartburn.  All other systems reviewed and are negative.   Blood pressure 124/91, pulse 58, temperature 96.9 F (36.1 C), temperature source Oral, resp. rate 16, height 6\' 1"  (1.854 m), weight 63.957 kg (141 lb), SpO2 99 %. Physical Exam  Constitutional: He is oriented to person, place, and time. He appears well-developed. No distress.  Cachetic.  HENT:  Head: Normocephalic and atraumatic.  Right Ear: External ear normal.  Left Ear: External ear normal.  Nose: Nose normal.  Mouth/Throat: Oropharynx is clear and moist.  Eyes: Conjunctivae and EOM are normal. Pupils are equal, round, and reactive to light.  Neck: Normal range of motion. Neck supple.  Cardiovascular: Normal rate.   Respiratory: Effort normal.  Musculoskeletal: Normal range of motion.  Neurological: He is alert and oriented to person, place, and time. No cranial nerve deficit.  Skin: Skin is warm and dry.  Psychiatric: He has a normal mood and affect. His behavior is normal. Judgment and thought content normal.     Assessment/Plan Right pyriform sinus mass, likely carcinoma To OR for awake tracheostomy followed by direct laryngoscopy with biopsy.  Will admit for about five days after surgery until first trach tube change.  Blandina Renaldo 02/22/2015, 11:37 AM

## 2015-02-22 NOTE — Progress Notes (Signed)
UR COMPLETED  

## 2015-02-22 NOTE — Progress Notes (Signed)
Pt reports that he had "a couple 40s" at 0300 this morning and took liquid hydrocodone at 0800.

## 2015-02-22 NOTE — Care Management Note (Addendum)
Case Management Note  Patient Details  Name: Marvin [name] MRN: [id number] Date of Birth: 1975/08/19  Subjective/Objective:                 Pt homeless per sister Marvin [name] admitted with  R supraglottic carcinoma, partial airway obstruction s/p tracheostomy and laryngoscopy with biopsy.    Action/Plan:  Return to home when medically stable. CM to f/u with d/c needs. Expected Discharge Date:                  Expected Discharge Plan:  [place]  In-House Referral:     Discharge planning Services  CM Consult  Post Acute Care Choice:    Choice offered to:     DME Arranged:    DME Agency:     HH Arranged:    [place] Agency:     Status of Service:  In process, will continue to follow  Medicare Important Message Given:    Date Medicare IM Given:    Medicare IM give by:    Date Additional Medicare IM Given:    Additional Medicare Important Message give by:     If discussed at [place] of Stay Meetings, dates discussed:    Additional Comments: Marvin [name] (Sister)  [phone]. CM spoke with sister regarding d/c planning and was informed by sister pt is homeless and have slept on various friends porches @ hs prior to admission. Sister states she has no room to take brother in, states she lives in small trailer with husband, mom lives in group home in [facility] and brother works out of town. Referral made for  CSW by CM. Referral also made with financial counselor Marvin Dooms [phone] regarding  pt with no insurance, has applied for disability in the past but was denied, sister requesting help with reapplying , voice message left per CM.  Marvin [name], [place] [phone] 02/22/2015, 7:45 PM

## 2015-02-22 NOTE — OR Nursing (Signed)
Pt arrived to pacu disoriented and unable to follow commands and disrupting medical devices.  He nodded yes to pain when asked if he was hurting.  No pacu orders were entered prior to patient's arrival to pacu. Dilaudid given for pain. Dr. Orene Desanctis notified and I am awaiting orders.

## 2015-02-22 NOTE — Anesthesia Preprocedure Evaluation (Addendum)
Anesthesia Evaluation   Patient awake    Reviewed: Allergy & Precautions, NPO status , Patient's Chart, lab work & pertinent test results  Airway Mallampati: II  TM Distance: >3 FB Neck ROM: Full  Mouth opening: Limited Mouth Opening  Dental  (+) Missing, Poor Dentition   Pulmonary shortness of breath, COPDCurrent Smoker,  breath sounds clear to auscultation        Cardiovascular Rhythm:Regular Rate:Normal     Neuro/Psych    GI/Hepatic GERD-  ,(+)     substance abuse  alcohol use,   Endo/Other  negative endocrine ROS  Renal/GU      Musculoskeletal   Abdominal   Peds  Hematology negative hematology ROS (+)   Anesthesia Other Findings   Reproductive/Obstetrics                            Anesthesia Physical Anesthesia Plan  ASA: III  Anesthesia Plan: MAC and General   Post-op Pain Management:    Induction: Intravenous  Airway Management Planned: Tracheostomy  Additional Equipment:   Intra-op Plan:   Post-operative Plan:   Informed Consent: I have reviewed the patients History and Physical, chart, labs and discussed the procedure including the risks, benefits and alternatives for the proposed anesthesia with the patient or authorized representative who has indicated his/her understanding and acceptance.   Dental advisory given  Plan Discussed with: CRNA and Surgeon  Anesthesia Plan Comments:         Anesthesia Quick Evaluation

## 2015-02-22 NOTE — Transfer of Care (Signed)
Immediate Anesthesia Transfer of Care Note  Patient: Marvin [name]  Procedure(s) Performed: Procedure(s) with comments: TRACHEOSTOMY (N/A) - awake tracheostomy DIRECT LARYNGOSCOPY (N/A) - direct laryngoscopy with biopsy  Patient Location: PACU  Anesthesia Type:General  Level of Consciousness: awake, alert  and oriented  Airway & Oxygen Therapy: Patient Spontanous Breathing and Patient connected to face mask oxygen  Post-op Assessment: Report given to RN and Post -op Vital signs reviewed and stable  Post vital signs: Reviewed and stable  Last Vitals:  Filed Vitals:   02/22/15 1010  BP: 124/91  Pulse: 58  Temp: 36.1 C  Resp: 16    Complications: No apparent anesthesia complications

## 2015-02-23 ENCOUNTER — Encounter (HOSPITAL_COMMUNITY): Payer: Self-pay | Admitting: Otolaryngology

## 2015-02-23 LAB — GLUCOSE, CAPILLARY
Glucose-Capillary: 114 mg/dL — ABNORMAL HIGH (ref 65–99)
Glucose-Capillary: 122 mg/dL — ABNORMAL HIGH (ref 65–99)

## 2015-02-23 MED ORDER — ACETAMINOPHEN 650 MG RE SUPP
650.0000 mg | Freq: Four times a day (QID) | RECTAL | Status: DC | PRN
  Administered 2015-02-24 (×2): 650 mg via RECTAL
  Filled 2015-02-23 (×2): qty 1

## 2015-02-23 MED ORDER — FOLIC ACID 5 MG/ML IJ SOLN
1.0000 mg | Freq: Every day | INTRAMUSCULAR | Status: DC
  Administered 2015-02-24 – 2015-02-26 (×3): 1 mg via INTRAVENOUS
  Filled 2015-02-23 (×4): qty 0.2

## 2015-02-23 MED ORDER — PROMETHAZINE HCL 25 MG/ML IJ SOLN
12.5000 mg | Freq: Four times a day (QID) | INTRAMUSCULAR | Status: DC | PRN
  Administered 2015-02-23 – 2015-02-24 (×2): 12.5 mg via INTRAVENOUS
  Filled 2015-02-23 (×2): qty 1

## 2015-02-23 NOTE — Evaluation (Signed)
Clinical/Bedside Swallow Evaluation Patient Details  Name: Marvin [name] MRN: [id number] Date of Birth: Mar 21, 1976  Today's Date: 02/23/2015 Time: SLP Start Time (ACUTE ONLY): 1057 SLP Stop Time (ACUTE ONLY): 1118 SLP Time Calculation (min) (ACUTE ONLY): 21 min  Past Medical History:  Past Medical History  Diagnosis Date  . ETOH abuse   . Alcohol dependence 04/17/2012  . COPD (chronic obstructive pulmonary disease)   . Shortness of breath dyspnea     occ  . Anxiety   . GERD (gastroesophageal reflux disease)   . Seizures     one episode 09/2012, suspected related to ETOH withdrawal   Past Surgical History:  Past Surgical History  Procedure Laterality Date  . Knee surgery Left     kneecap -screws  . Appendectomy    . Tracheostomy tube placement N/A 02/22/2015    Procedure: TRACHEOSTOMY;  Surgeon: [name], MD;  Location: [facility];  Service: ENT;  Laterality: N/A;  awake tracheostomy  . Direct laryngoscopy N/A 02/22/2015    Procedure: DIRECT LARYNGOSCOPY;  Surgeon: [name], MD;  Location: [facility] OR;  Service: ENT;  Laterality: N/A;  direct laryngoscopy with biopsy   HPI:  39 yo male with h/o ETOH abuse, COPD, anxiety, GERD, and seizures (suspect related to ETOH withdrawal), who presents with c/o throat pain, dysphagia, and changes in vocal quality that have worsened over the last two months. 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 Per MD note, there is associated narrowing of the airway.   Assessment / Plan / Recommendation Clinical Impression  PO trials were administered without PMV in place due to inability to tolerate valve throughout intake, and concern for impact of mass on ability to wear valve for more prolonged time. Ice chips and small, single sips of water elicited multiple swallows and immediate coughing. Recommend to remain NPO except for ice chips for today, with SLP f/u to determine readiness for MBS pending tolerance of PMV over the next few  days, in hopes that he will be able to wear the valve for MBS to optimize swallowing function.    Aspiration Risk  Severe    Diet Recommendation NPO;Ice chips PRN after oral care   Medication Administration: Via alternative means    Other  Recommendations Oral Care Recommendations: Oral care QID;Other (Comment) (before ice chips)   Follow Up Recommendations  Home health SLP    Frequency and Duration min 3x week  1 week   Pertinent Vitals/Pain [facility]    SLP Swallow Goals     Swallow Study Prior Functional Status       General Other Pertinent Information: 39 yo male with h/o ETOH abuse, COPD, anxiety, GERD, and seizures (suspect related to ETOH withdrawal), who presents with c/o throat pain, dysphagia, and changes in vocal quality that have worsened over the last two months. 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 Per MD note, there is associated narrowing of the airway. Type of Study: Bedside swallow evaluation Previous Swallow Assessment: none in chart Diet Prior to this Study: Thin liquids Temperature Spikes Noted: No Respiratory Status: Trach Trach Size and Type: Cuff;#6;Deflated;With PMSV not in place History of Recent Intubation: No Behavior/Cognition: Alert;Cooperative;Pleasant mood Self-Feeding Abilities: Able to feed self Patient Positioning: Upright in bed Baseline Vocal Quality: Low vocal intensity;Hoarse Volitional Cough: Wet    Oral/Motor/Sensory Function Overall Oral Motor/Sensory Function: Appears within functional limits for tasks assessed   Ice Chips Ice chips: Impaired Presentation: Self Fed;Spoon Pharyngeal Phase Impairments:  Cough - Immediate;Other (comments) (multiple swallows)   Thin Liquid Thin Liquid: Impaired Presentation: Cup;Self Fed Pharyngeal  Phase Impairments: Multiple swallows    Nectar Thick Nectar Thick Liquid: Not tested   Honey Thick Honey Thick Liquid: Not tested   Puree Puree: Not tested   Solid     Solid: Not tested      Marvin [name], M.A. CCC-SLP [phone]  Marvin [name] 02/23/2015,11:43 AM

## 2015-02-23 NOTE — Op Note (Signed)
NAME:  Marvin [name], Marvin [name] NO.:  [id number]  MEDICAL RECORD NO.:  [id number]  LOCATION:  3S07C                        FACILITY:  [facility]  PHYSICIAN:  Onnie Graham, MD     DATE OF BIRTH:  Feb 19, 1976  DATE OF PROCEDURE:  02/22/2015 DATE OF DISCHARGE:                              OPERATIVE REPORT   PREOPERATIVE DIAGNOSES: 1. Right piriform sinus cancer. 2. Partial airway obstruction.  POSTOPERATIVE DIAGNOSES: 1. Right supraglottic larynx cancer. 2. Partial airway obstruction.  PROCEDURE: 1. Awake tracheostomy. 2. Direct laryngoscopy with biopsy.  SURGEON:  Onnie Graham, MD.  ANESTHESIA:  Mask sedation followed by general endotracheal anesthesia.  COMPLICATIONS:  None.  INDICATION:  The patient is a 39 year old male with a history of heavy alcohol and smoking use who has a 2 month history of worsening sore throat associated with dysphagia, weight loss, and difficulty breathing at times.  He was found to have a bulky mass of the lateral supraglottic area suggesting a piriform sinus mass on CT imaging and presents to the operating room for surgical management.  Because of his difficulty breathing at times and narrowing of the airway, awake tracheostomy was recommended to control the airway prior to biopsy.  FINDINGS:  After performed tracheostomy, direct laryngoscopy was performed and demonstrates a bulky mass of the right lateral arytenoid area.  This does not extend to the anterior or lateral piriform sinus, but remains on the medial wall.  Tumor extends superiorly to seemed to include the pharyngoepiglottic fold.  The endolarynx was fairly normal appearing with a little bit of irregularity in the right false cord area.  All these areas were biopsied.  DESCRIPTION OF PROCEDURE:  The patient was identified in the holding room and informed consent having been obtained with discussion of risks, benefits, alternatives, the patient was brought to the operative  suite and put on the operative table in supine position.  He was maintained via mask ventilation and given light sedation.  The lower neck was marked with a marking pen and injected with 1% lidocaine with 1:100,0000 epinephrine.  The neck was prepped and draped in sterile fashion. Incision was made through the skin vertically using a 15 blade scalpel and extended through subcutaneous tissues and through the midline raphe using Bovie electrocautery.  The thyroid isthmus was divided in the same fashion and the tracheal wall was cleared off.  The tracheal wall and inside the trachea were injected then with local anesthetic.  A horizontal incision was made between rings 2 and 3 of  the trachea and a stay suture was passed around the ring above and a ring below the trach site.  A #6 cuffed trach tube was then placed into the airway with the cuff and the cuff was then inflated.  The anesthesia circuit was hooked to the tracheostomy tube, and the patient was then placed under general anesthesia.  The stay sutures were tied with 2 knots above and 1 knot below the trach site.  The trach flange was secured to the skin using 2- 0 silk suture in 4 quadrants.  The trach dressing and trach tie were added.  The patient was cleaned off and the bed was turned  90 degrees from anesthesia.  A tooth guard was placed over the upper teeth and an anterior commissure laryngoscope was then used to examine the various areas of the pharynx and larynx.  Findings are noted above and photographs were made.  Biopsies were then taken from the right pharyngoepiglottic fold, right lateral arytenoid area, and right false cord.  After this was completed, the airway was suctioned and the laryngoscope was removed along with the tooth guard.  He was returned to anesthesia for wake-up and was moved to room in stable condition     Onnie Graham, MD     DDB/MEDQ  D:  02/22/2015  T:  02/23/2015  Job:  [id number]

## 2015-02-23 NOTE — Evaluation (Signed)
Passy-Muir Speaking Valve - Evaluation Patient Details  Name: Marvin [name] MRN: [id number] Date of Birth: August 09, 1976  Today's Date: 02/23/2015 Time: 0768-0881 SLP Time Calculation (min) (ACUTE ONLY): 21 min  Past Medical History:  Past Medical History  Diagnosis Date  . ETOH abuse   . Alcohol dependence 04/17/2012  . COPD (chronic obstructive pulmonary disease)   . Shortness of breath dyspnea     occ  . Anxiety   . GERD (gastroesophageal reflux disease)   . Seizures     one episode 09/2012, suspected related to ETOH withdrawal   Past Surgical History:  Past Surgical History  Procedure Laterality Date  . Knee surgery Left     kneecap -screws  . Appendectomy    . Tracheostomy tube placement N/A 02/22/2015    Procedure: TRACHEOSTOMY;  Surgeon: [name], MD;  Location: [facility];  Service: ENT;  Laterality: N/A;  awake tracheostomy  . Direct laryngoscopy N/A 02/22/2015    Procedure: DIRECT LARYNGOSCOPY;  Surgeon: [name], MD;  Location: [facility] OR;  Service: ENT;  Laterality: N/A;  direct laryngoscopy with biopsy   HPI:  39 yo male with h/o ETOH abuse, COPD, anxiety, GERD, and seizures (suspect related to ETOH withdrawal), who presents with c/o throat pain, dysphagia, and changes in vocal quality that have worsened over the last two months. 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 Per MD note, there is associated narrowing of the airway.   Assessment / Plan / Recommendation Clinical Impression  Pt has moderate amounts of bloody, malodorous secretions, which he is able to tracheally expectorate with Mod cues. He is able to achieve low intensity phonation with finger occlusion and PMV placement, although hoarse in quality. Valve was placed for <60 second intervals until pt had increased WOB. Evidence of air trapping is noted upon valve removal. Suspect decreased airway patency is multifactorial in nature: presence of mass that narrows the airway, amount of  secretions, possible edema, size of trach. Recommend use of valve with SLP only at this time.     SLP Assessment  Patient needs continued Speech Lanaguage Pathology Services    Follow Up Recommendations  Home health SLP    Frequency and Duration min 2x/week  2 weeks   Pertinent Vitals/Pain [facility]    SLP Goals Potential to Achieve Goals (ACUTE ONLY): Good   PMSV Trial  PMSV was placed for: <60 seconds Able to redirect subglottic air through upper airway: Yes Able to Attain Phonation: Yes Voice Quality: Hoarse;Low vocal intensity;Wet Able to Expectorate Secretions: Yes Level of Secretion Expectoration with PMSV: Tracheal Breath Support for Phonation: Moderately decreased Intelligibility: Intelligibility reduced Phrase: 75-100% accurate Respirations During Trial: 13 SpO2 During Trial: 96 % Behavior: Alert;Controlled;Cooperative   Tracheostomy Tube       Vent Dependency  FiO2 (%): 28 %    Cuff Deflation Trial Tolerated Cuff Deflation:  (cuff deflated upon SLP arrival)    Germain Osgood, M.A. CCC-SLP [phone]  Germain Osgood 02/23/2015, 11:33 AM

## 2015-02-23 NOTE — Progress Notes (Signed)
Notified MD's nurse of pt's temperature. Awaiting orders. Will continue to monitor.

## 2015-02-23 NOTE — Progress Notes (Signed)
Spoke with Dr. Redmond Baseman nurse concerning pt's temperature. Awaiting orders.

## 2015-02-23 NOTE — Progress Notes (Signed)
1 Day Post-Op  Subjective: Did well overnight.  On alcohol withdrawal protocol.  Objective: Vital signs in last 24 hours: Temp:  [96.9 F (36.1 C)-101.3 F (38.5 C)] 101.3 F (38.5 C) (07/08 0700) Pulse Rate:  [55-145] 84 (07/08 0800) Resp:  [8-19] 15 (07/08 0800) BP: (102-136)/(66-99) 129/83 mmHg (07/08 0800) SpO2:  [94 %-100 %] 96 % (07/08 0800) FiO2 (%):  [28 %-40 %] 28 % (07/08 0800) Weight:  [62.4 kg (137 lb 9.1 oz)-63.957 kg (141 lb)] 62.4 kg (137 lb 9.1 oz) (07/07 1458)    Intake/Output from previous day: 07/07 0701 - 07/08 0700 In: 1900 [I.V.:1900] Out: 1850 [Urine:1850] Intake/Output this shift: Total I/O In: 195 [I.V.:195] Out: -   General appearance: alert, cooperative and no distress Neck: #6 cuffed Shiley trach tube in place, deflated the cuff, some bloody secretions, trach site stable.  Able to voice around occluded tube.  Lab Results:   Recent Labs  02/22/15 1731  WBC 5.8  HGB 13.4  HCT 38.5*  PLT 111*   BMET  Recent Labs  02/22/15 1731  CREATININE 0.65   PT/INR No results for input(s): LABPROT, INR in the last 72 hours. ABG No results for input(s): PHART, HCO3 in the last 72 hours.  Invalid input(s): PCO2, PO2  Studies/Results: No results found.  Anti-infectives: Anti-infectives    None      Assessment/Plan: s/p Procedure(s) with comments: TRACHEOSTOMY (N/A) - awake tracheostomy DIRECT LARYNGOSCOPY (N/A) - direct laryngoscopy with biopsy Cuff deflated around trach tube.  Will start liquid diet.  Will order Passy Muir valve.  Plan to keep him in step-down unit for at least another night for alcohol withdrawal protocol.  LOS: 1 day    Annais Crafts 02/23/2015

## 2015-02-24 MED ORDER — ENSURE ENLIVE PO LIQD
237.0000 mL | Freq: Two times a day (BID) | ORAL | Status: DC
  Administered 2015-02-24 – 2015-03-02 (×5): 237 mL via ORAL

## 2015-02-24 MED ORDER — BACITRACIN ZINC 500 UNIT/GM EX OINT
TOPICAL_OINTMENT | Freq: Two times a day (BID) | CUTANEOUS | Status: DC
  Administered 2015-02-24: 1 via TOPICAL
  Administered 2015-02-24: 15:00:00 via TOPICAL
  Administered 2015-02-25: 1 via TOPICAL
  Administered 2015-02-25 – 2015-03-06 (×17): via TOPICAL
  Administered 2015-03-06: 1 via TOPICAL
  Administered 2015-03-07 – 2015-03-08 (×3): via TOPICAL
  Filled 2015-02-24 (×3): qty 28.35

## 2015-02-24 MED ORDER — KCL IN DEXTROSE-NACL 20-5-0.45 MEQ/L-%-% IV SOLN
INTRAVENOUS | Status: DC
  Administered 2015-02-24: 11:00:00 via INTRAVENOUS
  Administered 2015-02-25: 1000 mL via INTRAVENOUS
  Administered 2015-02-25 – 2015-02-26 (×2): via INTRAVENOUS
  Administered 2015-02-27: 100 mL/h via INTRAVENOUS
  Administered 2015-02-27 – 2015-03-08 (×15): via INTRAVENOUS
  Filled 2015-02-24 (×26): qty 1000

## 2015-02-24 MED ORDER — IBUPROFEN 200 MG PO TABS
200.0000 mg | ORAL_TABLET | ORAL | Status: DC | PRN
  Administered 2015-02-24 – 2015-02-25 (×3): 400 mg via ORAL
  Filled 2015-02-24 (×3): qty 2

## 2015-02-24 MED ORDER — PNEUMOCOCCAL VAC POLYVALENT 25 MCG/0.5ML IJ INJ
0.5000 mL | INJECTION | INTRAMUSCULAR | Status: AC
  Administered 2015-02-25: 0.5 mL via INTRAMUSCULAR
  Filled 2015-02-24: qty 0.5

## 2015-02-24 NOTE — Progress Notes (Signed)
Initial Nutrition Assessment  DOCUMENTATION CODES:  Not applicable  INTERVENTION: - Will order Ensure Enlive BID, each supplement provides 350 kcal and 20 grams of protein - RD will continue to monitor for needs  NUTRITION DIAGNOSIS:  Increased nutrient needs related to cancer and cancer related treatments as evidenced by estimated needs  GOAL:  Patient will meet greater than or equal to 90% of their needs  MONITOR:  PO intake, Supplement acceptance, Weight trends, Labs  REASON FOR ASSESSMENT:  Consult Diet education  ASSESSMENT: 39 year old male with over two months of throat pain that has worsened and is associated with dysphagia and muffled voice. He feels some difficulty breathing at times. He has smoked for a long time and used alcohol heavily. He has lost some weight. 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 Pt seen for education and consult indicates that pt may need RT and/or chemo and may require TF in the future. BMI indicates underweight status. Unable to perform physical assessment as pt refused; pt likely meets criteria for malnutrition but unable to gather sufficient evidence at this time.  When talking with pt, he kept his eyes closed and only slightly shook/nodded head to respond to RD. Unable to adequately education at this time and may need reinforcement. Left handouts from the [employer] concerning FLD, soft high protein-high calorie foods, and nutrition therapy for head and neck cancers on pt's bedside table.   Per chart review, pt has lost 4 lbs (3% body weight) in the past 8 days which is significant for time frame. Notes indicate pt drinking heavily PTA.  Not meeting needs. Medications  reviewed. Labs reviewed; CBGs: 114-122 mg/dL, Cl: 100 mmol/L, Ca: 8.5 mg/dL, BUN low.  Height:  Ht Readings from Last 1 Encounters:  02/22/15 6\' 1"  (1.854 m)    Weight:  Wt Readings from Last 1 Encounters:  02/22/15 137 lb 9.1 oz (62.4 kg)    Ideal Body Weight:  83.6 kg (kg)  Wt Readings from Last 10 Encounters:  02/22/15 137 lb 9.1 oz (62.4 kg)  02/14/15 141 lb (63.957 kg)  08/19/13 149 lb 9 oz (67.841 kg)  09/24/12 130 lb (58.968 kg)  04/16/12 136 lb (61.689 kg)    BMI:  Body mass index is 18.15 kg/(m^2).  Estimated Nutritional Needs:  Kcal:  1900-2100  Protein:  80-90 grams  Fluid:  2.2 L/day  Skin:  Wound (see comment) (neck surgical wound from tracheostomy placement)  Diet Order:  Diet full liquid Room service appropriate?: Yes; Fluid consistency:: Thin  EDUCATION NEEDS:  Education needs addressed   Intake/Output Summary (Last 24 hours) at 02/24/15 1047 Last data filed at 02/24/15 0700  Gross per 24 hour  Intake   1005 ml  Output 1325.3 ml  Net -320.3 ml    Last BM:  PTA    Jarome Matin, RD, LDN Inpatient Clinical Dietitian Pager # [phone] After hours/weekend pager # [phone]

## 2015-02-24 NOTE — Progress Notes (Signed)
02/24/2015 9:48 AM  Marvin [name] [id number]  Post-Op Day 2    Temp:  [100 F (37.8 C)-102.8 F (39.3 C)] 100.9 F (38.3 C) (07/09 0700) Pulse Rate:  [72-115] 82 (07/09 0858) Resp:  [13-19] 14 (07/09 0858) BP: (111-138)/(69-98) 122/81 mmHg (07/09 0858) SpO2:  [95 %-100 %] 100 % (07/09 0858) FiO2 (%):  [28 %] 28 % (07/09 0858),     Intake/Output Summary (Last 24 hours) at 02/24/15 0948 Last data filed at 02/24/15 0700  Gross per 24 hour  Intake   1005 ml  Output 1325.3 ml  Net -320.3 ml    Results for orders placed or performed during the hospital encounter of 02/22/15 (from the past 24 hour(s))  Glucose, capillary     Status: Abnormal   Collection Time: 02/23/15  9:51 PM  Result Value Ref Range   Glucose-Capillary 122 (H) 65 - 99 mg/dL   Comment 1 Notify RN     SUBJECTIVE:  Neck pain.  Breathing more easily.  Nurses have noted some agitation/confusion.  Speech path working with [place].  No trach care teaching thus far.  Home Health arrangements in process.  OBJECTIVE:  Trach secure.  Breathing well.  Mildly bloody secretions  IMPRESSION:  Satisfactory check  PLAN:    Advance diet.  Home care teaching.  Keep in stepdown. Advance activity. May need a feeding tube at some point.  Jodi Marble

## 2015-02-24 NOTE — Progress Notes (Signed)
Speech Language Pathology Treatment: Nada Boozer Speaking valve  Patient Details Name: Marvin [name] MRN: [id number] DOB: [birth year] Today's Date: 02/24/2015 Time: 9977-4142 SLP Time Calculation (min) (ACUTE ONLY): 25 min  Assessment / Plan / Recommendation Clinical Impression  Patient was seen for follow up PMSV trials.  Secretions have decreased however are still blood tinged.  The patient showed a patent upper respiratory track and PMSV was placed.  The patient was able to tolerate placement for 12 mins with no changes in respiratory status.  Mild coughing was noted particularly during attempts to phonate.  Vocal quality was hoarse and very low leading to poor intelligibility.  Mild air trapping was noted, however did not appear to cause the patient any issues.  Recommend continued ST for PMSV placement as well as swallowing evaluation.  Currently, recommend PMSV placement with ST only.     HPI Other Pertinent Information: 39 yo male with h/o ETOH abuse, COPD, anxiety, GERD, and seizures (suspect related to ETOH withdrawal), who presents with c/o throat pain, dysphagia, and changes in vocal quality that have worsened over the last two months. 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 Per MD note, there is associated narrowing of the airway.   Pertinent Vitals Pain Assessment: No/denies pain  SLP Plan  Continue with current plan of care                  Plan: Continue with current plan of care    GO     Marvin [name] 02/24/2015, 8:53 AM  Marvin [name], [place] SLP [phone]

## 2015-02-25 ENCOUNTER — Inpatient Hospital Stay (HOSPITAL_COMMUNITY): Payer: Medicaid Other

## 2015-02-25 LAB — CBC
HEMATOCRIT: 34.2 % — AB (ref 39.0–52.0)
HEMOGLOBIN: 11.8 g/dL — AB (ref 13.0–17.0)
MCH: 33.4 pg (ref 26.0–34.0)
MCHC: 34.5 g/dL (ref 30.0–36.0)
MCV: 96.9 fL (ref 78.0–100.0)
Platelets: 85 10*3/uL — ABNORMAL LOW (ref 150–400)
RBC: 3.53 MIL/uL — ABNORMAL LOW (ref 4.22–5.81)
RDW: 13.1 % (ref 11.5–15.5)
WBC: 6.4 10*3/uL (ref 4.0–10.5)

## 2015-02-25 MED ORDER — CETYLPYRIDINIUM CHLORIDE 0.05 % MT LIQD
7.0000 mL | Freq: Two times a day (BID) | OROMUCOSAL | Status: DC
  Administered 2015-02-25 – 2015-03-02 (×10): 7 mL via OROMUCOSAL

## 2015-02-25 MED ORDER — CHLORHEXIDINE GLUCONATE 0.12 % MT SOLN
15.0000 mL | Freq: Two times a day (BID) | OROMUCOSAL | Status: DC
  Administered 2015-02-25 – 2015-03-02 (×9): 15 mL via OROMUCOSAL
  Filled 2015-02-25 (×12): qty 15

## 2015-02-25 MED ORDER — ACETAMINOPHEN 160 MG/5ML PO SOLN
325.0000 mg | ORAL | Status: DC | PRN
  Administered 2015-02-28 – 2015-03-02 (×2): 650 mg via ORAL
  Filled 2015-02-25 (×2): qty 20.3

## 2015-02-25 NOTE — Progress Notes (Signed)
Pt noted to be coughing via trach large amounts of bloody secretions.  Pt able to clear airway without difficulty.  Trach care done.  Will continue to monitor.

## 2015-02-25 NOTE — Progress Notes (Signed)
02/25/2015 11:05 AM  Marvin [name] [id number]  Post-Op Day 3    Temp:  [98.6 F (37 C)-102.7 F (39.3 C)] 98.8 F (37.1 C) (07/10 1034) Pulse Rate:  [78-104] 79 (07/10 0815) Resp:  [13-20] 14 (07/10 0815) BP: (108-138)/(66-90) 125/87 mmHg (07/10 0815) SpO2:  [96 %-99 %] 96 % (07/10 0815) FiO2 (%):  [28 %] 28 % (07/10 0815),     Intake/Output Summary (Last 24 hours) at 02/25/15 1105 Last data filed at 02/25/15 0500  Gross per 24 hour  Intake   2000 ml  Output   1300 ml  Net    700 ml    Results for orders placed or performed during the hospital encounter of 02/22/15 (from the past 24 hour(s))  CBC     Status: Abnormal   Collection Time: 02/25/15  2:40 AM  Result Value Ref Range   WBC 6.4 4.0 - 10.5 K/uL   RBC 3.53 (L) 4.22 - 5.81 MIL/uL   Hemoglobin 11.8 (L) 13.0 - 17.0 g/dL   HCT 34.2 (L) 39.0 - 52.0 %   MCV 96.9 78.0 - 100.0 fL   MCH 33.4 26.0 - 34.0 pg   MCHC 34.5 30.0 - 36.0 g/dL   RDW 13.1 11.5 - 15.5 %   Platelets 85 (L) 150 - 400 K/uL    SUBJECTIVE:  Some shakiness.  Some wheezing.  No chest pain or dyspnea.  Nurses noted some bright red blood from trach today.    OBJECTIVE:  Color, energy OK.  No frank bleeding at present .  Breathing comfortably  IMPRESSION:  Fever better.  Mild bleeding from trach site or possibly tumor.  No elevation of WBC  PLAN:  Will keep in stepdown.  CXR.  Sputum culture.  Dietitian consult.  Jodi Marble

## 2015-02-26 ENCOUNTER — Inpatient Hospital Stay (HOSPITAL_COMMUNITY): Payer: Medicaid Other

## 2015-02-26 MED ORDER — FOLIC ACID 1 MG PO TABS
1.0000 mg | ORAL_TABLET | Freq: Every day | ORAL | Status: DC
  Administered 2015-03-02 – 2015-03-08 (×5): 1 mg via ORAL
  Filled 2015-02-26 (×5): qty 1

## 2015-02-26 NOTE — Progress Notes (Signed)
Patient transferred to 0P49 via bed by Bella Kennedy, RN and [place], [place]. Belongings sent with patient. No family at bedside at this time, however aware of transfer.

## 2015-02-26 NOTE — Progress Notes (Signed)
Report called to Ria Comment, RN on 6N. Called patient's sister, Freida Busman, to notify of transfer.

## 2015-02-26 NOTE — Progress Notes (Addendum)
Nutrition Consult/Follow Up  DOCUMENTATION CODES:  Severe malnutrition in context of chronic illness, Underweight  INTERVENTION:   PO diet advancement vs TF initiation   RD to follow for nutrition care plan  NUTRITION DIAGNOSIS:  Increased nutrient needs related to cancer and cancer related treatments as evidenced by estimated needs, ongoing  GOAL:  Patient will meet greater than or equal to 90% of their needs, unmet  MONITOR:  Diet advancement, PO intake, Labs, Weight trends, I & O's  ASSESSMENT: 39 year old male with over two months of throat pain that has worsened and is associated with dysphagia and muffled voice. He feels some difficulty breathing at times. He has smoked for a long time and used alcohol heavily. He has lost some weight. 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  Patient s/p procedure 7/8: AWAKE TRACHEOSTOMY DIRECT LARYNGOSCOPY WITH BIOPSY  Initial nutrition assessment completed 7/9.  S/p MBSS today.  SLP recommending NPO status until trach change tomorrow.  Question whether pt will need short vs long term nutrition support.  Nutrition-Focused physical exam completed. Findings are mild/moderate fat depletion, severe muscle depletion, and no edema.    Height:  Ht Readings from Last 1 Encounters:  02/22/15 6\' 1"  (1.854 m)    Weight:  Wt Readings from Last 1 Encounters:  02/22/15 137 lb 9.1 oz (62.4 kg)    Ideal Body Weight:  83.6 kg (kg)  Wt Readings from Last 10 Encounters:  02/22/15 137 lb 9.1 oz (62.4 kg)  02/14/15 141 lb (63.957 kg)  08/19/13 149 lb 9 oz (67.841 kg)  09/24/12 130 lb (58.968 kg)  04/16/12 136 lb (61.689 kg)    BMI:  Body mass index is 18.15 kg/(m^2).  Estimated Nutritional Needs:  Kcal:  1900-2100  Protein:  80-90 grams  Fluid:  2.2 L/day  Skin:  Wound (see comment) (neck surgical wound  from tracheostomy placement)  Diet Order:  Diet NPO time specified  EDUCATION NEEDS:  Education needs addressed   Intake/Output Summary (Last 24 hours) at 02/26/15 1503 Last data filed at 02/26/15 1200  Gross per 24 hour  Intake   2840 ml  Output      0 ml  Net   2840 ml    Last BM:  7/11  Arthur Holms, RD, LDN Pager #: [phone] After-Hours Pager #: [phone]

## 2015-02-26 NOTE — Progress Notes (Addendum)
MBSS complete. Full report located under chart review in imaging section. Pt recommended to be NPO due to aspiration of all PO textures. Left message for Dr. Redmond Baseman, will go ahead and order pt NPO per SLP order.  Herbie [place], [place] CCC-SLP [phone]

## 2015-02-26 NOTE — Progress Notes (Addendum)
Speech Language Pathology Treatment: Nada Boozer Speaking valve  Patient Details Name: JOZIYAH ROBLERO MRN: [id number] DOB: [birth year] Today's Date: 02/26/2015 Time: 0518-3358 SLP Time Calculation (min) (ACUTE ONLY): 24 min  Assessment / Plan / Recommendation Clinical Impression  Pt seen for PMSV trials and swallow assessment. Pt repositioned to facilitate breathing and redirection of air, encouraged to clear airway of secretions with verbal cues for cough and oral care. PMSV placed with no change in vital signs, but audible resistance in upper airway and reduced passage of air for exhalation and speech. Air trapping not noted after brief placement, however prolonged placement could not be achieved as pt consistently coughed off valve with excessive force after 2-3 minutes of use. There is not adequate redirection of air to upper airway for consistent use of PMSV due to reported narrowing of airway from pressure on trachea, 6 shiley with deflated cuff and suspected secretions. Will continue trials, but expect pt will need smaller or cuffless trach to have more reliable success.  PO trials also administered, pt has been started on a liquid diet but reports he has taken little due to throat pain and coughing. PMSV in place with trials, but only briefly. Pt observed to have timely strong swallow, but trials followed by delayed cough, audibly wet secretions and thinned tracheal secretions. Suspect pt may be aspirating thin liquids. Recommend MBS to objectively evaluate swallow function with and without PMSV in place as usage is not consistent.    HPI Other Pertinent Information: 39 yo male with h/o ETOH abuse, COPD, anxiety, GERD, and seizures (suspect related to ETOH withdrawal), who presents with c/o throat pain, dysphagia, and changes in vocal quality that have worsened over the last two months. 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 Per MD note, there is associated narrowing of  the airway.   Pertinent Vitals    SLP Plan  Continue with current plan of care    Recommendations        Patient may use Passy-Muir Speech Valve: with SLP only MD: Please consider changing trach tube to : Smaller size;Cuffless       Oral Care Recommendations: Oral care QID;Other (Comment) Follow up Recommendations: Home health SLP Plan: Continue with current plan of care    GO    [facility], [place] CCC-SLP [phone]  Lynann Beaver 02/26/2015, 8:59 AM

## 2015-02-26 NOTE — Clinical Social Work Note (Signed)
CSW received consult for patient having homeless issues, CSW did not have time to meet with patient will complete tomorrow morning.  [name]. [place], MSW, [place] 02/26/2015 4:59 PM

## 2015-02-26 NOTE — Progress Notes (Signed)
4 Days Post-Op  Subjective: Doing a bit better with less need for Ativan.  Still unsteady when he tries to get out of bed.  Doing well with tracheostomy.  Less fever.  Objective: Vital signs in last 24 hours: Temp:  [98.4 F (36.9 C)-99.9 F (37.7 C)] 99.6 F (37.6 C) (07/11 0700) Pulse Rate:  [65-98] 68 (07/11 0345) Resp:  [14-24] 15 (07/11 0345) BP: (113-140)/(66-86) 128/72 mmHg (07/11 0345) SpO2:  [98 %-100 %] 100 % (07/11 0819) FiO2 (%):  [28 %] 28 % (07/11 0819) Last BM Date: 02/25/15  Intake/Output from previous day: 07/10 0701 - 07/11 0700 In: 3030 [P.O.:830; I.V.:2200] Out: -  Intake/Output this shift:    General appearance: alert, cooperative and no distress Neck: Trach site stable with #6 cuffed tube in place.  Lab Results:   Recent Labs  02/25/15 0240  WBC 6.4  HGB 11.8*  HCT 34.2*  PLT 85*   BMET No results for input(s): NA, K, CL, CO2, GLUCOSE, BUN, CREATININE, CALCIUM in the last 72 hours. PT/INR No results for input(s): LABPROT, INR in the last 72 hours. ABG No results for input(s): PHART, HCO3 in the last 72 hours.  Invalid input(s): PCO2, PO2  Studies/Results: Dg Chest 2 View  02/25/2015   CLINICAL DATA:  Aspiration into the airway  EXAM: CHEST  2 VIEW  COMPARISON:  01/21/2015  FINDINGS: Patchy left lower lobe opacity, suspicious for pneumonia. No pleural effusion or pneumothorax.  Tracheostomy in satisfactory position.  The heart is normal in size.  Visualized osseous structures are within normal limits.  IMPRESSION: Patchy left lower lobe opacity, suspicious for pneumonia.   Electronically Signed   By: Julian Hy M.D.   On: 02/25/2015 18:08    Anti-infectives: Anti-infectives    None      Assessment/Plan: s/p Procedure(s) with comments: TRACHEOSTOMY (N/A) - awake tracheostomy DIRECT LARYNGOSCOPY (N/A) - direct laryngoscopy with biopsy Will transfer to a regular room.  Continue current care.  Plan to change to cuffless trach tube  tomorrow.  Social work consult pending for discharge planning.  Will discuss further treatment options tomorrow with patient and family.  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  LOS: 4 days    Marvin [name] 02/26/2015

## 2015-02-27 ENCOUNTER — Encounter: Payer: Self-pay | Admitting: *Deleted

## 2015-02-27 ENCOUNTER — Encounter (HOSPITAL_COMMUNITY): Payer: Self-pay | Admitting: Radiology

## 2015-02-27 ENCOUNTER — Inpatient Hospital Stay (HOSPITAL_COMMUNITY): Payer: Medicaid Other

## 2015-02-27 DIAGNOSIS — E440 Moderate protein-calorie malnutrition: Secondary | ICD-10-CM | POA: Insufficient documentation

## 2015-02-27 DIAGNOSIS — E46 Unspecified protein-calorie malnutrition: Secondary | ICD-10-CM | POA: Insufficient documentation

## 2015-02-27 MED ORDER — HEPARIN SODIUM (PORCINE) 5000 UNIT/ML IJ SOLN
5000.0000 [IU] | Freq: Three times a day (TID) | INTRAMUSCULAR | Status: AC
  Administered 2015-02-27 – 2015-03-04 (×12): 5000 [IU] via SUBCUTANEOUS
  Filled 2015-02-27 (×11): qty 1

## 2015-02-27 MED ORDER — CEFAZOLIN SODIUM-DEXTROSE 2-3 GM-% IV SOLR
2.0000 g | INTRAVENOUS | Status: AC
  Administered 2015-02-28: 2 g via INTRAVENOUS
  Filled 2015-02-27: qty 50

## 2015-02-27 NOTE — Progress Notes (Addendum)
Radiation Oncology         [phone] ________________________________  Initial inpatient Consultation  Name: Marvin [name] MRN: [id number]  Date: 02/07/2015  DOB: [birth year]  CC:No PCP Per Patient  No ref. 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 male who presented with ~4 month history of neck pain, throat pain, muffled speech, dysphagia.   Was seen in ED in early June. CT Scan was done at that time on 01-22-15.  This was reviewed by me and showed a 3.8 cm mass  in the right supraglottis/ bordering the hypopharynx and pathologic appearing right neck nodes. Patient advised by ER physician to f/u with ENT.  A month later, inpatient biopsy of mass by Dr Redmond Baseman revealed squamous cell carcinoma , p16 negative. Lurline Idol has been placed with copious secretions, currently.   No PET scan thus far.  H/O smoking and ETOH abuse.  Weight loss as well. Pt reports 30-40lb   Dr Redmond Baseman discussed treatment options including total laryngectomy with neck dissections with possible post-operative radiation versus primary radiation and chemotherapy.  Transfer to a skilled nursing facility may be pursued. Patient agreed to proceed with placement of a G-tube. This was ordered by Dr Redmond Baseman.  Diagnosis 02/22/15 1. 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 2. Larynx, biopsy, Right lateral arytenoid area - INVASIVE SQUAMOUS CELL CARCINOMA, SEE COMMENT. 3. Vocal cord, biopsy, Right false cord - BENIGN VOCAL CORD. - NEGATIVE FOR ATYPIA OR MALIGNANCY  PREVIOUS RADIATION THERAPY: No  PAST MEDICAL HISTORY:  has a past medical history of ETOH abuse; Alcohol dependence (04/17/2012); COPD (chronic obstructive pulmonary disease); Shortness of breath dyspnea; Anxiety; GERD (gastroesophageal reflux disease); and Seizures.    PAST SURGICAL  HISTORY: Past Surgical History  Procedure Laterality Date  . Knee surgery Left     kneecap -screws  . Appendectomy    . Tracheostomy tube placement N/A 02/22/2015    Procedure: TRACHEOSTOMY;  Surgeon: [name], MD;  Location: [place];  Service: ENT;  Laterality: N/A;  awake tracheostomy  . Direct laryngoscopy N/A 02/22/2015    Procedure: DIRECT LARYNGOSCOPY;  Surgeon: [name], MD;  Location: [place];  Service: ENT;  Laterality: N/A;  direct laryngoscopy with biopsy    FAMILY HISTORY: family history includes Cancer in his mother.  SOCIAL HISTORY:  reports that he has been smoking Cigarettes.  He has a 28 pack-year smoking history. He has never used smokeless tobacco. He reports that he drinks alcohol. He reports that he does not use illicit drugs. He lives with his sister. Denies employment.  ALLERGIES: Review of patient's allergies indicates no known allergies.  MEDICATIONS:  Current Facility-Administered Medications  Medication Dose Route Frequency Provider Last Rate Last Dose  . acetaminophen (TYLENOL) solution 325-650 mg  325-650 mg Oral E3X PRN Jodi Marble, MD      . antiseptic oral rinse (CPC / CETYLPYRIDINIUM CHLORIDE 0.05%) solution 7 mL  7 mL Mouth Rinse q12n4p [name], MD   7 mL at 02/27/15 1634  . bacitracin ointment   Topical BID [name], MD      . ceFAZolin (ANCEF) IVPB 2 g/50 mL premix  2 g Intravenous to XRAY Monia Sabal, PA-C      . chlorhexidine (PERIDEX) 0.12 % solution 15 mL  15 mL Mouth Rinse BID [name], MD   15 mL at 02/27/15 2147  .  dextrose 5 % and 0.45 % NaCl with KCl 20 mEq/L infusion   Intravenous Continuous Jodi Marble, MD 161 mL/hr at 02/27/15 2147    . feeding supplement (ENSURE ENLIVE) (ENSURE ENLIVE) liquid 237 mL  237 mL Oral BID BM Maricela Bo Ost[place], RD   237 mL at 02/26/15 1000  . folic acid (FOLVITE) tablet 1 mg  1 mg Oral Daily [name], MD   1 mg at 02/27/15 1000  . heparin injection 5,000 Units  5,000 Units Subcutaneous 3  times per day Monia Sabal, PA-C   Stopped at 02/28/15 1400  . HYDROcodone-acetaminophen (HYCET) 7.5-325 mg/15 ml solution 15 mL  15 mL Oral QID PRN [name], MD      . ibuprofen (ADVIL,MOTRIN) tablet 200-400 mg  200-400 mg Oral W9U PRN Jodi Marble, MD   045 mg at 02/25/15 4098  . LORazepam (ATIVAN) injection 2-3 mg  2-3 mg Intravenous Q1H PRN [name], MD   2 mg at 02/27/15 2350  . morphine 2 MG/ML injection 2-4 mg  2-4 mg Intravenous Q2H PRN [name], MD   2 mg at 02/28/15 0640  . multivitamin with minerals tablet 1 tablet  1 tablet Oral Daily [name], MD   1 tablet at 02/26/15 1043  . nicotine (NICODERM CQ - dosed in mg/24 hours) patch 21 mg  21 mg Transdermal Daily [name], MD   21 mg at 02/27/15 1226  . promethazine (PHENERGAN) injection 12.5 mg  12.5 mg Intravenous Q6H PRN [name], MD   12.5 mg at 02/24/15 0603  . promethazine (PHENERGAN) tablet 12.5 mg  12.5 mg Oral Q6H PRN [name], MD       Or  . promethazine (PHENERGAN) suppository 12.5 mg  12.5 mg Rectal Q6H PRN [name], MD   12.5 mg at 02/22/15 2359  . thiamine (VITAMIN B-1) tablet 100 mg  100 mg Oral Daily [name], MD   100 mg at 02/26/15 1043   Or  . thiamine (B-1) injection 100 mg  100 mg Intravenous Daily [name], MD   100 mg at 02/27/15 1227    REVIEW OF SYSTEMS:  Notable for that above.   PHYSICAL EXAM:  height is 6\' 1"  (1.854 m) and weight is 137 lb 9.1 oz (62.4 kg). His oral temperature is 98.3 F (36.8 C). His blood pressure is 124/84 and his pulse is 98. His respiration is 18 and oxygen saturation is 91%.   General: Alert and oriented, in no acute distress HEENT: Head is normocephalic. Extraocular movements are intact. Oral cavity without obvious lesions. Teeth present Neck: Neck is notable for palpable 1.5 cm node in anterior right level II. Trach patent with secretions Abdomen: non tender Extremities: No cyanosis or edema. Lymphatics: see Neck Exam Skin: No concerning  lesions. Musculoskeletal: symmetric strength and muscle tone throughout. Neurologic: Cranial nerves II through XII are grossly intact. No obvious focalities.  Psychiatric: Judgment and insight are intact. Affect is appropriate.   ECOG = 2  0 - Asymptomatic (Fully active, able to carry on all predisease activities without restriction)  1 - Symptomatic but completely ambulatory (Restricted in physically strenuous activity but ambulatory and able to carry out work of a light or sedentary nature. For example, light housework, office work)  2 - Symptomatic, <50% in bed during the day (Ambulatory and capable of all self care but unable to carry out any work activities. Up and about more than 50% of waking hours)  3 - Symptomatic, >50%  in bed, but not bedbound (Capable of only limited self-care, confined to bed or chair 50% or more of waking hours)  4 - Bedbound (Completely disabled. Cannot carry on any self-care. Totally confined to bed or chair)  5 - Death   Marvin [name] MM, Creech RH, Tormey DC, et al. [phone]). "Toxicity and response criteria of the [facility]". [place] Oncol. 5 (6): 649-55   LABORATORY DATA:  Lab Results  Component Value Date   WBC 5.2 02/28/2015   HGB 11.1* 02/28/2015   HCT 31.8* 02/28/2015   MCV 95.8 02/28/2015   PLT 181 02/28/2015   CMP     Component Value Date/Time   NA 135 02/14/2015 1439   K 4.2 02/14/2015 1439   CL 100* 02/14/2015 1439   CO2 24 02/14/2015 1439   GLUCOSE 81 02/14/2015 1439   BUN <5* 02/14/2015 1439   CREATININE 0.65 02/22/2015 1731   CALCIUM 8.5* 02/14/2015 1439   PROT 7.7 02/14/2015 1439   ALBUMIN 3.3* 02/14/2015 1439   AST 56* 02/14/2015 1439   ALT 27 02/14/2015 1439   ALKPHOS 113 02/14/2015 1439   BILITOT 0.4 02/14/2015 1439   GFRNONAA >60 02/22/2015 1731   GFRAA >60 02/22/2015 1731         RADIOGRAPHY: Ct Abdomen Wo Contrast  02/27/2015   CLINICAL DATA:  Evaluate for G-tube placement. History of  throat cancer. Dysphagia.  EXAM: CT ABDOMEN WITHOUT CONTRAST  TECHNIQUE: Multidetector CT imaging of the abdomen was performed following the standard protocol without IV contrast.  COMPARISON:  03/24/2008  FINDINGS: Consolidation in the left lung base likely to represent pneumonia. There is mucous plugging in the left lower lobe bronchi. Mild bronchial wall thickening. Patchy emphysematous changes in the lungs.  The unenhanced appearance of the liver, spleen, gallbladder, pancreas, adrenal glands, kidneys, abdominal aorta, inferior vena cava, and retroperitoneal lymph nodes is unremarkable. Stomach, small bowel, and colon are not abnormally distended. Contrast material demonstrated in the colon. No free air or free fluid in the abdomen. Visualized bones appear intact. No destructive bone lesions.  IMPRESSION: No acute process demonstrated in the abdomen on unenhanced imaging. Contrast material throughout the colon.   Electronically Signed   By: Lucienne Capers M.D.   On: 02/27/2015 21:35   Dg Chest 2 View  02/25/2015   CLINICAL DATA:  Aspiration into the airway  EXAM: CHEST  2 VIEW  COMPARISON:  01/21/2015  FINDINGS: Patchy left lower lobe opacity, suspicious for pneumonia. No pleural effusion or pneumothorax.  Tracheostomy in satisfactory position.  The heart is normal in size.  Visualized osseous structures are within normal limits.  IMPRESSION: Patchy left lower lobe opacity, suspicious for pneumonia.   Electronically Signed   By: Julian Hy M.D.   On: 02/25/2015 18:08   Dg Swallowing Func-speech Pathology  02/26/2015    Objective Swallowing Evaluation:    Patient Details  Name: Marvin [name] MRN: [id number] Date of Birth: [birth year]  Today's Date: 02/26/2015 Time: SLP Start Time (ACUTE ONLY): 1140-SLP Stop Time (ACUTE ONLY): 1200 SLP Time Calculation (min) (ACUTE ONLY): 20 min  Past Medical History:  Past Medical History  Diagnosis Date  . ETOH abuse   . Alcohol dependence 04/17/2012  . COPD  (chronic obstructive pulmonary disease)   . Shortness of breath dyspnea     occ  . Anxiety   . GERD (gastroesophageal reflux disease)   . Seizures     one episode 09/2012, suspected related to ETOH withdrawal  Past Surgical History:  Past Surgical History  Procedure Laterality Date  . Knee surgery Left     kneecap -screws  . Appendectomy    . Tracheostomy tube placement N/A 02/22/2015    Procedure: TRACHEOSTOMY;  Surgeon: [name], MD;  Location: [place];   Service: ENT;  Laterality: N/A;  awake tracheostomy  . Direct laryngoscopy N/A 02/22/2015    Procedure: DIRECT LARYNGOSCOPY;  Surgeon: [name], MD;  [facility] OR;  Service: ENT;  Laterality: N/A;  direct laryngoscopy with biopsy   HPI:  Other Pertinent Information: 39 yo male with h/o ETOH abuse, COPD,  anxiety, GERD, and seizures (suspect related to ETOH withdrawal), who  presents with c/o throat pain, dysphagia, and changes in vocal quality  that have worsened over the last two months. 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 Per MD note, there is associated  narrowing of the airway.  No Data Recorded  Assessment / Plan / Recommendation CHL IP CLINICAL IMPRESSIONS 02/26/2015  Therapy Diagnosis Moderate pharyngeal phase dysphagia  Clinical Impression Pt presents with a structural and respiratory based  dysphagia due to presence of pharyngeal tumor and lack of redirection of  air to upper airway to cough. The pts swallow is strong and timely, but  all PO collect around tumor and mix with pooled standing secretions and  are silently aspirated post swallow. The pt is unable to clear aspirate  due to inability to tolerate PMSV or redirect air past trach. SLP used  finger occlusion during exam to aid in expectoration (Pt could not  maintain PMSV in place due to air trapping) and minimal movement of upper  airway secretions/barium visible with most secretions being forced out of  the stoma below the trach, indicating very little upper  airway patency.  significant quantity of barium also seen at trach site post test. Until pt  is able to tolerate PMSV to clear upper airway and utilize protective  cough, PO tolerance is unlikely and aspiration risk will be high.  Recommend NPO until trach change tomorrow. Will follow for trials then.       CHL IP TREATMENT RECOMMENDATION 02/23/2015  Treatment Recommendations Therapy as outlined in treatment plan below     CHL IP DIET RECOMMENDATION 02/26/2015  SLP Diet Recommendations NPO;Ice chips PRN after oral care  Liquid Administration via (None)  Medication Administration Via alternative means  Compensations (None)  Postural Changes and/or Swallow Maneuvers (None)     CHL IP OTHER RECOMMENDATIONS 02/26/2015  Recommended Consults (None)  Oral Care Recommendations Oral care QID;Other (Comment)  Other Recommendations (None)     CHL IP FOLLOW UP RECOMMENDATIONS 02/26/2015  Follow up Recommendations Home health SLP     CHL IP FREQUENCY AND DURATION 02/23/2015  Speech Therapy Frequency (ACUTE ONLY) min 3x week  Treatment Duration 1 week     Pertinent Vitals/Pain NA    SLP Swallow Goals No flowsheet data found.  No flowsheet data found.    CHL IP REASON FOR REFERRAL 02/26/2015  Reason for Referral Objectively evaluate swallowing function             No flowsheet data found.  No flowsheet data found.         [name], [name] 02/26/2015, 1:56 PM       IMPRESSION/PLAN:  This is a very nice gentleman with head and neck cancer. I recommend radiotherapy for this patient.  We discussed the potential risks, benefits, and side effects of radiotherapy.  We talked in detail about acute and late effects. We discussed that some of the most bothersome acute effects may be mucositis, dysgeusia, salivary changes, skin irritation, hair loss, dehydration, weight loss and fatigue. We talked about late effects which include but are not necessarily limited to dysphagia, hypothyroidism, nerve injury, spinal cord injury, xerostomia,  trismus, and neck edema. No guarantees of treatment were given.  The patient is enthusiastic about proceeding with treatment. I look forward to participating in the patient's care.    Simulation (treatment planning) will take place after PET as an outpatient. I will ask [name], RN, our Head and Neck Oncology Navigator to arrange PET close to discharge.  We also discussed that the treatment of head and neck cancer is a multidisciplinary process to maximize treatment outcomes and quality of life. For this reasons the following referrals have been or will be made:  Consulting Medical oncology to discuss chemotherapy   Outpatient referral Dentistry is not critical at this time as I do not anticipate direct radiation to the tooth roots. This may be indicated later for assessment/advice on reducing risk of cavities or other oral issues.  Outpatient referral to Nutritionist for nutrition support during and after treatment.  Outpatient referral to Speech language pathology for swallowing and/or speech therapy.  Outpatient referral to Social work for social support.   I will arrange Baseline lab- TSH.  G-Tube placement pending (inpatient).  Appreciate Dr Redmond Baseman' starting pt on multivitamin and thiamine supplements, given ETOH history and malnutrition.  Patient discussed with Dr Alvy Bimler and Dr Redmond Baseman. Dr Redmond Baseman and I agree that ChRT is prudent instead of surgery at this time. __________________________________________   [name], MD

## 2015-02-27 NOTE — Clinical Social Work Note (Signed)
CSW attempted to meet with patient, to discuss consult for homeless issues, but patient did not wake up, CSW will try to meet with patient again tomorrow.  [name]. [place], MSW, [place] 02/27/2015 4:58 PM

## 2015-02-27 NOTE — Progress Notes (Signed)
Speech Language Pathology Treatment: Dysphagia  Patient Details Name: Marvin [name] MRN: [id number] DOB: 04-21-1976 Today's Date: 02/27/2015 Time: 0998-3382 SLP Time Calculation (min) (ACUTE ONLY): 13 min  Assessment / Plan / Recommendation Clinical Impression  Received permission from Dr. Redmond Baseman to allow small sips of water, ice chips when PMV is in place.  Pt expressed satisfaction that he can consume limited POs; for G-tube placement tomorrow.  Pt placed PMV independently; consumed small sips of water with no overt s/s of aspiration.  He reported improvement in ability to swallow (may be result of trach change to cuffless and improved PMV use). Min verbal cues to double swallow with each sip and consume small boluses.  Reiterated importance of using PMV when drinking water.  Pt remains an aspiration risk - once enteral feeding established, may benefit from repeat MBS.  He will want to take some POs though primary nutrition will be via G-tube. SLP will follow.   HPI Other Pertinent Information: 39 yo male with h/o ETOH abuse, COPD, anxiety, GERD, and seizures (suspect related to ETOH withdrawal), who presents with c/o throat pain, dysphagia, and changes in vocal quality that have worsened over the last two months. 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 Per MD note, there is associated narrowing of the airway.   Pertinent Vitals    SLP Plan  Continue with current plan of care    Recommendations Diet recommendations: NPO (sips of water/ice chips) Liquids provided via: Straw Medication Administration: Via alternative means Compensations: Multiple dry swallows after each bite/sip;Small sips/bites (with PMV in place) Postural Changes and/or Swallow Maneuvers: Seated upright 90 degrees      Patient may use Passy-Muir Speech Valve: Intermittently with supervision PMSV Supervision: Full       Oral Care Recommendations: Oral care QID;Other (Comment) Follow up  Recommendations:  (tba) Plan: Continue with current plan of care    GO     Assunta Curtis 02/27/2015, 3:33 PM

## 2015-02-27 NOTE — Consult Note (Signed)
Chief Complaint: dysphagia  Referring Physician(s): Dr Redmond Baseman  History of Present Illness: Marvin [name] is a 39 y.o. male   Admitted with dysphagia Cannot swallow Throat pain Hx ETOH abuse/dependency Smoker New dx supraglottic Ca Malnutrition  Request for percutaneous gastric tube placement Have discussed with Dr Laurence Ferrari Pt tentatively scheduled for procedure 7/13 in IR Dependent on CT Abd results---ordered to evaluate anatomy Will review with Radiologist in am   Past Medical History  Diagnosis Date  . ETOH abuse   . Alcohol dependence 04/17/2012  . COPD (chronic obstructive pulmonary disease)   . Shortness of breath dyspnea     occ  . Anxiety   . GERD (gastroesophageal reflux disease)   . Seizures     one episode 09/2012, suspected related to ETOH withdrawal    Past Surgical History  Procedure Laterality Date  . Knee surgery Left     kneecap -screws  . Appendectomy    . Tracheostomy tube placement N/A 02/22/2015    Procedure: TRACHEOSTOMY;  Surgeon: [name], MD;  Location: [facility];  Service: ENT;  Laterality: N/A;  awake tracheostomy  . Direct laryngoscopy N/A 02/22/2015    Procedure: DIRECT LARYNGOSCOPY;  Surgeon: [name], MD;  Location: [facility];  Service: ENT;  Laterality: N/A;  direct laryngoscopy with biopsy    Allergies: Review of patient's allergies indicates no known allergies.  Medications: Prior to Admission medications   Medication Sig Start Date End Date Taking? Authorizing Provider  HYDROcodone-acetaminophen (HYCET) 7.5-325 mg/15 ml solution Take 15 mLs by mouth 4 (four) times daily as needed for moderate pain. 01/22/15 01/22/16 Yes Linton Flemings, MD  naproxen (NAPROSYN) 500 MG tablet Take 1 tablet (500 mg total) by mouth 2 (two) times daily. Patient not taking: Reported on 02/14/2015 10/09/14   Britt Bottom, NP  sulfamethoxazole-trimethoprim (SEPTRA DS) 800-160 MG per tablet Take 1 tablet by mouth 2 (two) times daily. Patient not  taking: Reported on 10/09/2014 08/21/13   Clayton Bibles, PA-C     Family History  Problem Relation Age of Onset  . Cancer Mother     History   Social History  . Marital Status: Single    Spouse Name: N/A  . Number of Children: N/A  . Years of Education: N/A   Social History Main Topics  . Smoking status: Current Every Day Smoker -- 1.00 packs/day for 28 years    Types: Cigarettes  . Smokeless tobacco: Never Used  . Alcohol Use: Yes     Comment: 6 -40-oz. beers daily  . Drug Use: No  . Sexual Activity: Not on file   Other Topics Concern  . None   Social History Narrative     Review of Systems: A 12 point ROS discussed and pertinent positives are indicated in the HPI above.  All other systems are negative.  Review of Systems  Constitutional: Positive for activity change, appetite change, fatigue and unexpected weight change. Negative for fever.  Respiratory: Positive for choking. Negative for shortness of breath.   Neurological: Positive for weakness.  Psychiatric/Behavioral: Negative for behavioral problems and confusion.    Vital Signs: BP 119/79 mmHg  Pulse 77  Temp(Src) 100.6 F (38.1 C) (Oral)  Resp 20  Ht 6\' 1"  (1.854 m)  Wt 137 lb 9.1 oz (62.4 kg)  BMI 18.15 kg/m2  SpO2 99%  Physical Exam  Constitutional: He is oriented to person, place, and time.  Neck:  trach  Cardiovascular: Normal rate and regular rhythm.   Pulmonary/Chest:  Breath sounds normal. He has no wheezes.  trach  Abdominal: Soft. Bowel sounds are normal. There is no tenderness.  Musculoskeletal: Normal range of motion.  Neurological: He is alert and oriented to person, place, and time.  Skin: Skin is warm and dry.  Psychiatric: He has a normal mood and affect. His behavior is normal. Judgment and thought content normal.  Nursing note and vitals reviewed.   Mallampati Score:  MD Evaluation Airway: Other (comments) Airway comments: trach Heart: WNL Abdomen: WNL Chest/ Lungs: WNL ASA   Classification: 3 Mallampati/Airway Score: Three  Imaging: Dg Chest 2 View  02/25/2015   CLINICAL DATA:  Aspiration into the airway  EXAM: CHEST  2 VIEW  COMPARISON:  01/21/2015  FINDINGS: Patchy left lower lobe opacity, suspicious for pneumonia. No pleural effusion or pneumothorax.  Tracheostomy in satisfactory position.  The heart is normal in size.  Visualized osseous structures are within normal limits.  IMPRESSION: Patchy left lower lobe opacity, suspicious for pneumonia.   Electronically Signed   By: Julian Hy M.D.   On: 02/25/2015 18:08   Dg Swallowing Func-speech Pathology  02/26/2015    Objective Swallowing Evaluation:    Patient Details  Name: Marvin [name] MRN: [id number] Date of Birth: Mar 23, 1976  Today's Date: 02/26/2015 Time: SLP Start Time (ACUTE ONLY): 1140-SLP Stop Time (ACUTE ONLY): 1200 SLP Time Calculation (min) (ACUTE ONLY): 20 min  Past Medical History:  Past Medical History  Diagnosis Date  . ETOH abuse   . Alcohol dependence 04/17/2012  . COPD (chronic obstructive pulmonary disease)   . Shortness of breath dyspnea     occ  . Anxiety   . GERD (gastroesophageal reflux disease)   . Seizures     one episode 09/2012, suspected related to ETOH withdrawal   Past Surgical History:  Past Surgical History  Procedure Laterality Date  . Knee surgery Left     kneecap -screws  . Appendectomy    . Tracheostomy tube placement N/A 02/22/2015    Procedure: TRACHEOSTOMY;  Surgeon: [name], MD;  Location: [facility];   Service: ENT;  Laterality: N/A;  awake tracheostomy  . Direct laryngoscopy N/A 02/22/2015    Procedure: DIRECT LARYNGOSCOPY;  Surgeon: [name], MD;  Location:  [facility] OR;  Service: ENT;  Laterality: N/A;  direct laryngoscopy with biopsy   HPI:  Other Pertinent Information: 39 yo male with h/o ETOH abuse, COPD,  anxiety, GERD, and seizures (suspect related to ETOH withdrawal), who  presents with c/o throat pain, dysphagia, and changes in vocal quality  that have worsened over the last  two months. 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 Per MD note, there is associated  narrowing of the airway.  No Data Recorded  Assessment / Plan / Recommendation CHL IP CLINICAL IMPRESSIONS 02/26/2015  Therapy Diagnosis Moderate pharyngeal phase dysphagia  Clinical Impression Pt presents with a structural and respiratory based  dysphagia due to presence of pharyngeal tumor and lack of redirection of  air to upper airway to cough. The pts swallow is strong and timely, but  all PO collect around tumor and mix with pooled standing secretions and  are silently aspirated post swallow. The pt is unable to clear aspirate  due to inability to tolerate PMSV or redirect air past trach. SLP used  finger occlusion during exam to aid in expectoration (Pt could not  maintain PMSV in place due to air trapping) and minimal movement of upper  airway secretions/barium visible with  most secretions being forced out of  the stoma below the trach, indicating very little upper airway patency.  significant quantity of barium also seen at trach site post test. Until pt  is able to tolerate PMSV to clear upper airway and utilize protective  cough, PO tolerance is unlikely and aspiration risk will be high.  Recommend NPO until trach change tomorrow. Will follow for trials then.       CHL IP TREATMENT RECOMMENDATION 02/23/2015  Treatment Recommendations Therapy as outlined in treatment plan below     CHL IP DIET RECOMMENDATION 02/26/2015  SLP Diet Recommendations NPO;Ice chips PRN after oral care  Liquid Administration via (None)  Medication Administration Via alternative means  Compensations (None)  Postural Changes and/or Swallow Maneuvers (None)     CHL IP OTHER RECOMMENDATIONS 02/26/2015  Recommended Consults (None)  Oral Care Recommendations Oral care QID;Other (Comment)  Other Recommendations (None)     CHL IP FOLLOW UP RECOMMENDATIONS 02/26/2015  Follow up Recommendations Home health SLP     CHL IP FREQUENCY AND  DURATION 02/23/2015  Speech Therapy Frequency (ACUTE ONLY) min 3x week  Treatment Duration 1 week     Pertinent Vitals/Pain NA    SLP Swallow Goals No flowsheet data found.  No flowsheet data found.    CHL IP REASON FOR REFERRAL 02/26/2015  Reason for Referral Objectively evaluate swallowing function             No flowsheet data found.  No flowsheet data found.         [name], [name] 02/26/2015, 1:56 PM     Labs:  CBC:  Recent Labs  01/21/15 2009 01/21/15 2033 02/14/15 1439 02/22/15 1731 02/25/15 0240  WBC 3.8*  --  4.4 5.8 6.4  HGB 13.1 14.6 13.4 13.4 11.8*  HCT 36.6* 43.0 39.0 38.5* 34.2*  PLT 143*  --  122* 111* 85*    COAGS:  Recent Labs  02/14/15 1439  INR 1.06  APTT 35    BMP:  Recent Labs  01/21/15 2033 02/14/15 1439 02/22/15 1731  NA 133* 135  --   K 3.6 4.2  --   CL 94* 100*  --   CO2  --  24  --   GLUCOSE 90 81  --   BUN <3* <5*  --   CALCIUM  --  8.5*  --   CREATININE 1.20 0.46* 0.65  GFRNONAA  --  >60 >60  GFRAA  --  >60 >60    LIVER FUNCTION TESTS:  Recent Labs  02/14/15 1439  BILITOT 0.4  AST 56*  ALT 27  ALKPHOS 113  PROT 7.7  ALBUMIN 3.3*    TUMOR MARKERS: No results for input(s): AFPTM, CEA, CA199, CHROMGRNA in the last 8760 hours.  Assessment and Plan:  Dysphagia  malnutrition Supraglottic Ca Scheduled for potential percutaneous gastric tube placement in IR 7/13 Risks and Benefits discussed with the patient including, but not limited to the need for a barium enema during the procedure, bleeding, infection, peritonitis, or damage to adjacent structures. All of the patient's questions were answered, patient is agreeable to proceed. Consent signed and in chart.   Thank you for this interesting consult.  I greatly enjoyed meeting LARNELL GRANLUND and look forward to participating in their care.  Signed: Yailyn Strack A 02/27/2015, 2:45 PM   I spent a total of 40 Minutes    in face to face in clinical consultation,  greater than 50% of which was counseling/coordinating care for  perc G tube

## 2015-02-27 NOTE — Progress Notes (Signed)
Speech Language Pathology Treatment: Marvin [name] Speaking valve  Patient Details Name: Marvin [name] MRN: [id number] DOB: 10/13/1975 Today's Date: 02/27/2015 Time: 4536-4680 SLP Time Calculation (min) (ACUTE ONLY): 32 min  Assessment / Plan / Recommendation Clinical Impression  Trach changed today to cuffless #6 per Marvin [name].  Absence of cuff resulted in much improved toleration of PMV.  Pt able to use valve for 30-minute duration with improved vocal quality, no signs of air trapping, and restoration of ability to blow nose and expel some secretions orally.  Sp02 remained >95%.  Instructed pt in placement/removal of valve, which he is able to perform independently.  Marvin [name] voiced preference to resume eating again.  We discussed results of yesterday's MBS  - trace aspiration of all consistencies present.  However, swallow function may be better today with trach change and improved toleration of PMV.  I will call Marvin [name] for direction.      HPI Other Pertinent Information: 39 yo male with h/o ETOH abuse, COPD, anxiety, GERD, and seizures (suspect related to ETOH withdrawal), who presents with c/o throat pain, dysphagia, and changes in vocal quality that have worsened over the last two months. 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 Per MD note, there is associated narrowing of the airway.   Pertinent Vitals    SLP Plan  Continue with current plan of care    Recommendations        Patient may use Passy-Muir Speech Valve: Intermittently with supervision       Oral Care Recommendations: Oral care QID;Other (Comment) Follow up Recommendations:  (tba) Plan: Continue with current plan of care   Marvin [name] L. Tivis Ringer, [place] CCC/SLP Pager [phone]      Marvin [name] 02/27/2015, 1:20 PM

## 2015-02-27 NOTE — Progress Notes (Signed)
Head and Neck Cancer Location of Tumor / Histology: T3N2a right supraglottic carcinoma, dysphagia, malnutrition   [name]. Marvin [name] presented with "over two months of throat pain that has worsened and is associated with dysphagia and muffled voice. He feels some difficulty breathing at times. He has smoked for a long time and used alcohol heavily. He has lost some weight. Examination by CT and flexible laryngoscopy demonstrated a bulky mass in the left pyriform sinus fixating the left side of the larynx and narrowing the airway."  Biopsies of Epiglottis (if applicable) revealed:  10/24/23 Diagnosis 1. 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 2. Larynx, biopsy, Right lateral arytenoid area - INVASIVE SQUAMOUS CELL CARCINOMA, SEE COMMENT. 3. Vocal cord, biopsy, Right false cord - BENIGN VOCAL CORD. - NEGATIVE FOR ATYPIA OR MALIGNANCY  Nutrition Status Yes No Comments  Weight changes? [x]  []    Swallowing concerns? [x]  []  Dysphagia to all consistencies/ Trach  PEG? []  []  Order placed by surgeon   Referrals Yes No Comments  Social Work? [x]  []    Dentistry? []  []    Swallowing therapy? [x]  []    Nutrition? [x]  []    Med/Onc? []  []     Safety Issues Yes No Comments  Prior radiation? []  [x]    Pacemaker/ICD? []  [x]    Possible current pregnancy? []  [x]    Is the patient on methotrexate? []  [x]     Tobacco/Marijuana/Snuff/ETOH use:   Past/Anticipated interventions by otolaryngology, if any: Biospy  Past/Anticipated interventions by medical oncology, if any:    On Alcohol withdrawal Protocol  Homeless  Current Complaints / other details:   No chief complaint on file.

## 2015-02-27 NOTE — Progress Notes (Signed)
5 Days Post-Op  Subjective: Doing well in regular room.  Speech pathology evaluation and results noted.  Nutritionist input noted.  He has no specific complaints.  Objective: Vital signs in last 24 hours: Temp:  [98.7 F (37.1 C)-100.6 F (38.1 C)] 100.6 F (38.1 C) (07/12 5329) Pulse Rate:  [80-101] 101 (07/12 0801) Resp:  [15-22] 22 (07/12 0801) BP: (119-133)/(78-89) 119/79 mmHg (07/12 0632) SpO2:  [98 %-100 %] 98 % (07/12 0801) FiO2 (%):  [28 %] 28 % (07/12 0801) Last BM Date: 02/26/15  Intake/Output from previous day: 07/11 0701 - 07/12 0700 In: 2880 [P.O.:480; I.V.:2400] Out: 500 [Urine:500] Intake/Output this shift:    General appearance: alert, cooperative and no distress Neck: trach site soupy, #6 cuffed trach tube in place.  Lab Results:   Recent Labs  02/25/15 0240  WBC 6.4  HGB 11.8*  HCT 34.2*  PLT 85*   BMET No results for input(s): NA, K, CL, CO2, GLUCOSE, BUN, CREATININE, CALCIUM in the last 72 hours. PT/INR No results for input(s): LABPROT, INR in the last 72 hours. ABG No results for input(s): PHART, HCO3 in the last 72 hours.  Invalid input(s): PCO2, PO2  Studies/Results: Dg Chest 2 View  02/25/2015   CLINICAL DATA:  Aspiration into the airway  EXAM: CHEST  2 VIEW  COMPARISON:  01/21/2015  FINDINGS: Patchy left lower lobe opacity, suspicious for pneumonia. No pleural effusion or pneumothorax.  Tracheostomy in satisfactory position.  The heart is normal in size.  Visualized osseous structures are within normal limits.  IMPRESSION: Patchy left lower lobe opacity, suspicious for pneumonia.   Electronically Signed   By: Julian Hy M.D.   On: 02/25/2015 18:08   Dg Swallowing Func-speech Pathology  02/26/2015    Objective Swallowing Evaluation:    Patient Details  Name: Marvin [name] MRN: [id number] Date of Birth: Jan 01, 1976  Today's Date: 02/26/2015 Time: SLP Start Time (ACUTE ONLY): 1140-SLP Stop Time (ACUTE ONLY): 1200 SLP Time Calculation  (min) (ACUTE ONLY): 20 min  Past Medical History:  Past Medical History  Diagnosis Date  . ETOH abuse   . Alcohol dependence 04/17/2012  . COPD (chronic obstructive pulmonary disease)   . Shortness of breath dyspnea     occ  . Anxiety   . GERD (gastroesophageal reflux disease)   . Seizures     one episode 09/2012, suspected related to ETOH withdrawal   Past Surgical History:  Past Surgical History  Procedure Laterality Date  . Knee surgery Left     kneecap -screws  . Appendectomy    . Tracheostomy tube placement N/A 02/22/2015    Procedure: TRACHEOSTOMY;  Surgeon: [name], MD;  Location: [facility];   Service: ENT;  Laterality: N/A;  awake tracheostomy  . Direct laryngoscopy N/A 02/22/2015    Procedure: DIRECT LARYNGOSCOPY;  Surgeon: [name], MD;  Location:  [facility] OR;  Service: ENT;  Laterality: N/A;  direct laryngoscopy with biopsy   HPI:  Other Pertinent Information: 39 yo male with h/o ETOH abuse, COPD,  anxiety, GERD, and seizures (suspect related to ETOH withdrawal), who  presents with c/o throat pain, dysphagia, and changes in vocal quality  that have worsened over the last two months. 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 Per MD note, there is associated  narrowing of the airway.  No Data Recorded  Assessment / Plan / Recommendation CHL IP CLINICAL IMPRESSIONS 02/26/2015  Therapy Diagnosis Moderate pharyngeal phase dysphagia  Clinical Impression Pt presents  with a structural and respiratory based  dysphagia due to presence of pharyngeal tumor and lack of redirection of  air to upper airway to cough. The pts swallow is strong and timely, but  all PO collect around tumor and mix with pooled standing secretions and  are silently aspirated post swallow. The pt is unable to clear aspirate  due to inability to tolerate PMSV or redirect air past trach. SLP used  finger occlusion during exam to aid in expectoration (Pt could not  maintain PMSV in place due to air trapping) and minimal movement  of upper  airway secretions/barium visible with most secretions being forced out of  the stoma below the trach, indicating very little upper airway patency.  significant quantity of barium also seen at trach site post test. Until pt  is able to tolerate PMSV to clear upper airway and utilize protective  cough, PO tolerance is unlikely and aspiration risk will be high.  Recommend NPO until trach change tomorrow. Will follow for trials then.       CHL IP TREATMENT RECOMMENDATION 02/23/2015  Treatment Recommendations Therapy as outlined in treatment plan below     CHL IP DIET RECOMMENDATION 02/26/2015  SLP Diet Recommendations NPO;Ice chips PRN after oral care  Liquid Administration via (None)  Medication Administration Via alternative means  Compensations (None)  Postural Changes and/or Swallow Maneuvers (None)     CHL IP OTHER RECOMMENDATIONS 02/26/2015  Recommended Consults (None)  Oral Care Recommendations Oral care QID;Other (Comment)  Other Recommendations (None)     CHL IP FOLLOW UP RECOMMENDATIONS 02/26/2015  Follow up Recommendations Home health SLP     CHL IP FREQUENCY AND DURATION 02/23/2015  Speech Therapy Frequency (ACUTE ONLY) min 3x week  Treatment Duration 1 week     Pertinent Vitals/Pain NA    SLP Swallow Goals No flowsheet data found.  No flowsheet data found.    CHL IP REASON FOR REFERRAL 02/26/2015  Reason for Referral Objectively evaluate swallowing function             No flowsheet data found.  No flowsheet data found.         [name], Marvin [name] 02/26/2015, 1:56 PM     Anti-infectives: Anti-infectives    None      Assessment/Plan: T3N2a right supraglottic carcinoma, dysphagia, malnutrition s/p Procedure(s) with comments: TRACHEOSTOMY (N/A) - awake tracheostomy DIRECT LARYNGOSCOPY (N/A) - direct laryngoscopy with biopsy Trach tube changed to cuffless #6 tube without difficutly.  Wound is soupy, at least in part related to malnutrition.  Swallow evaluation results noted with aspiration of  all consistencies.  Family is at bedside.  We discussed the pathology results showing squamous cell carcinoma and stage appears to be T3N2a.  We discussed treatment options including total laryngectomy with neck dissections with possible post-operative radiation versus primary radiation and chemotherapy.  Pros and cons of each approach were discussed.  I will request a radiation oncology consultation to discuss options further.  We discussed discharge planning.  A social work consult is pending.  Transfer to a skilled nursing facility may be the best option.  We discussed malnutrition and dysphagia/aspiration.  We agreed to proceed with placement of a G-tube.  An interventional radiology consult will be requested but placement should be done without passing a catheter or scope past the larynx.  LOS: 5 days    Lety Cullens 02/27/2015

## 2015-02-28 ENCOUNTER — Inpatient Hospital Stay (HOSPITAL_COMMUNITY): Payer: Medicaid Other

## 2015-02-28 ENCOUNTER — Other Ambulatory Visit: Payer: Self-pay | Admitting: Radiation Oncology

## 2015-02-28 ENCOUNTER — Ambulatory Visit
Admit: 2015-02-28 | Discharge: 2015-02-28 | Disposition: A | Discharge status: Home / Self Care | Payer: MEDICAID | Attending: Radiation Oncology | Admitting: Radiation Oncology

## 2015-02-28 DIAGNOSIS — R110 Nausea: Secondary | ICD-10-CM

## 2015-02-28 DIAGNOSIS — D630 Anemia in neoplastic disease: Secondary | ICD-10-CM

## 2015-02-28 DIAGNOSIS — R070 Pain in throat: Secondary | ICD-10-CM

## 2015-02-28 DIAGNOSIS — C321 Malignant neoplasm of supraglottis: Principal | ICD-10-CM

## 2015-02-28 DIAGNOSIS — E46 Unspecified protein-calorie malnutrition: Secondary | ICD-10-CM

## 2015-02-28 LAB — CBC
HEMATOCRIT: 31.8 % — AB (ref 39.0–52.0)
Hemoglobin: 11.1 g/dL — ABNORMAL LOW (ref 13.0–17.0)
MCH: 33.4 pg (ref 26.0–34.0)
MCHC: 34.9 g/dL (ref 30.0–36.0)
MCV: 95.8 fL (ref 78.0–100.0)
PLATELETS: 181 10*3/uL (ref 150–400)
RBC: 3.32 MIL/uL — AB (ref 4.22–5.81)
RDW: 12.5 % (ref 11.5–15.5)
WBC: 5.2 10*3/uL (ref 4.0–10.5)

## 2015-02-28 LAB — PROTIME-INR
INR: 1.15 (ref 0.00–1.49)
PROTHROMBIN TIME: 14.9 s (ref 11.6–15.2)

## 2015-02-28 LAB — CULTURE, RESPIRATORY

## 2015-02-28 LAB — APTT: aPTT: 35 seconds (ref 24–37)

## 2015-02-28 LAB — CULTURE, RESPIRATORY W GRAM STAIN

## 2015-02-28 MED ORDER — FENTANYL CITRATE (PF) 100 MCG/2ML IJ SOLN
INTRAMUSCULAR | Status: AC | PRN
  Administered 2015-02-28 (×2): 50 ug via INTRAVENOUS

## 2015-02-28 MED ORDER — MIDAZOLAM HCL 2 MG/2ML IJ SOLN
INTRAMUSCULAR | Status: AC | PRN
  Administered 2015-02-28 (×2): 1 mg via INTRAVENOUS

## 2015-02-28 MED ORDER — FENTANYL CITRATE (PF) 100 MCG/2ML IJ SOLN
INTRAMUSCULAR | Status: AC
  Filled 2015-02-28: qty 2

## 2015-02-28 MED ORDER — LIDOCAINE VISCOUS 2 % MT SOLN
OROMUCOSAL | Status: AC
  Filled 2015-02-28: qty 15

## 2015-02-28 MED ORDER — BACITRACIN-NEOMYCIN-POLYMYXIN 400-5-5000 EX OINT
1.0000 "application " | TOPICAL_OINTMENT | Freq: Every day | CUTANEOUS | Status: DC
  Administered 2015-03-01 – 2015-03-04 (×4): 1 via TOPICAL
  Filled 2015-02-28 (×3): qty 1

## 2015-02-28 MED ORDER — MIDAZOLAM HCL 2 MG/2ML IJ SOLN
INTRAMUSCULAR | Status: AC
  Filled 2015-02-28: qty 2

## 2015-02-28 MED ORDER — LIDOCAINE HCL 1 % IJ SOLN
INTRAMUSCULAR | Status: AC
  Filled 2015-02-28: qty 20

## 2015-02-28 MED ORDER — GLUCAGON HCL RDNA (DIAGNOSTIC) 1 MG IJ SOLR
INTRAMUSCULAR | Status: AC
  Filled 2015-02-28: qty 1

## 2015-02-28 MED ORDER — IOHEXOL 300 MG/ML  SOLN
50.0000 mL | Freq: Once | INTRAMUSCULAR | Status: AC | PRN
  Administered 2015-02-28: 15 mL via INTRAVENOUS

## 2015-02-28 NOTE — Progress Notes (Signed)
Nutrition Follow-up  DOCUMENTATION CODES:   Severe malnutrition in context of chronic illness, Underweight  INTERVENTION:   -Recommend initiate Adult Tube Feeding Protocol after G-tube is placed -Recommend initiate Jevity 1.2 @ 20 ml/hr via G-tube and increase by 10 ml every 12 hours to goal rate of 70 ml/hr.  Tube feeding regimen provides 2016 kcal (100% of needs), 93 grams of protein, and 1356 ml of H2O.   -Recommend monitor K, Mg, and Phos daily x 3 days due to high risk of refeeding syndrome  NUTRITION DIAGNOSIS:   Increased nutrient needs related to cancer and cancer related treatments as evidenced by estimated needs.  Ongoing  GOAL:   Patient will meet greater than or equal to 90% of their needs  Unmet  MONITOR:   Diet advancement, PO intake, Labs, Weight trends, I & O's  REASON FOR ASSESSMENT:   Consult Diet education  ASSESSMENT:   39 year old male with over two months of throat pain that has worsened and is associated with dysphagia and muffled voice. He feels some difficulty breathing at times. He has smoked for a long time and used alcohol heavily. He has lost some weight. 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  Patient s/p procedure 7/8: AWAKE TRACHEOSTOMY DIRECT LARYNGOSCOPY WITH BIOPSY  Reviewed SLP note from 02/27/15; pt with permission from MD to allow small sips of water and ice chips when PSMV in place.   Pt is currently NPO; he is awaiting G-tube placement with IR later on today with plan to initiate TF. RD will provide recommendations.   CSW following for potential SNF placement.   Oncology also following for coordination of outpatient treatment. Plan PET scan as outpatient.   Diet Order:  Diet NPO time specified Except for: Sips with Meds  Skin:  Reviewed, no issues (closed neck incision)  Last BM:  02/26/15  Height:    Ht Readings from Last 1 Encounters:  02/22/15 6\' 1"  (1.854 m)    Weight:   Wt Readings from Last 1 Encounters:  02/22/15 137 lb 9.1 oz (62.4 kg)    Ideal Body Weight:  83.6 kg (kg)  Wt Readings from Last 10 Encounters:  02/22/15 137 lb 9.1 oz (62.4 kg)  02/14/15 141 lb (63.957 kg)  08/19/13 149 lb 9 oz (67.841 kg)  09/24/12 130 lb (58.968 kg)  04/16/12 136 lb (61.689 kg)    BMI:  Body mass index is 18.15 kg/(m^2).  Estimated Nutritional Needs:   Kcal:  1900-2100  Protein:  80-90 grams  Fluid:  2.2 L/day  EDUCATION NEEDS:   Education needs addressed  Hervey Wedig A. Jimmye Norman, RD, LDN, CDE Pager: [phone] After hours Pager: [phone]

## 2015-02-28 NOTE — Consult Note (Signed)
[facility] CONSULT NOTE  Patient Care Team: No Pcp Per Patient as PCP - General (General Practice)  CHIEF COMPLAINTS/PURPOSE OF CONSULTATION:  Head and Neck Cancer I have seen the patient, examined him and edited the notes as follows  HISTORY OF PRESENTING ILLNESS:  Marvin [name] 39 y.o. male admitted with 48-month history of progressive dysphagia, unable to swallow, with severe throat pain. He denies any hearing deficit, but does report changes in the quality of his voice due to muffled speech. He also reports a 30-40 lbs weight loss over the last 6 months. Denies fevers, chills, night sweats, vision changes, or mucositis. He reports fatigue with dyspnea on exertion. Denies any chest pain or palpitations. Denies lower extremity swelling. Denies nausea, reports heartburn, denies change in bowel habits. Denies any dysuria. Denies abnormal skin rashes, or neuropathy. Denies any bleeding issues such as epistaxis, hematemesis, hematuria or hematochezia. Ambulating without difficulty. He has a history of tobacco and alcohol abuse.  He initially presented to the Emergency Department in June, at which time, a CT of the neck on 6/6 showed a 3.8 cm mass in the right suppraglottis bordering the hypopharynx, with pathologic appearing lymph nodes. He had an outpatient follow up with Dr. Redmond Baseman, ENT, who recommended a biopsy of the mass on 6/7. 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 Radiation Oncology consulted patient, recommending radiation as outpatient. We were consulted anticipating that he will require chemotherapy treatment once discharged from hospital.   MEDICAL HISTORY:  Past Medical History  Diagnosis Date  . ETOH abuse   . Alcohol dependence 04/17/2012  . COPD (chronic obstructive pulmonary disease)   . Shortness of breath  dyspnea     occ  . Anxiety   . GERD (gastroesophageal reflux disease)   . Seizures     one episode 09/2012, suspected related to ETOH withdrawal    SURGICAL HISTORY: Past Surgical History  Procedure Laterality Date  . Knee surgery Left     kneecap -screws  . Appendectomy    . Tracheostomy tube placement N/A 02/22/2015    Procedure: TRACHEOSTOMY;  Surgeon: [name], MD;  Location: [facility];  Service: ENT;  Laterality: N/A;  awake tracheostomy  . Direct laryngoscopy N/A 02/22/2015    Procedure: DIRECT LARYNGOSCOPY;  Surgeon: [name], MD;  Location: [facility];  Service: ENT;  Laterality: N/A;  direct laryngoscopy with biopsy    SOCIAL HISTORY: History   Social History  . Marital Status: Single    Spouse Name: N/A  . Number of Children: N/A  . Years of Education: N/A   Occupational History  . Not on file.   Social History Main Topics  . Smoking status: Current Every Day Smoker -- 1.00 packs/day for 28 years    Types: Cigarettes  . Smokeless tobacco: Never Used  . Alcohol Use: Yes     Comment: 6 -40-oz. beers daily  . Drug Use: No  . Sexual Activity: Not on file   Other Topics Concern  . He is homeless   Social History Narrative    FAMILY HISTORY: Family History  Problem Relation Age of Onset  . Cancer Mother     ALLERGIES:  has No Known Allergies.  MEDICATIONS:  Scheduled Meds: . antiseptic oral rinse  7 mL Mouth Rinse q12n4p  . bacitracin   Topical BID  .  ceFAZolin (ANCEF) IV  2 g Intravenous to XRAY  . chlorhexidine  15 mL Mouth Rinse BID  . feeding supplement (ENSURE ENLIVE)  237 mL Oral BID BM  . folic acid  1 mg Oral Daily  . heparin subcutaneous  5,000 Units Subcutaneous 3 times per day  . multivitamin with minerals  1 tablet Oral Daily  . nicotine  21 mg Transdermal Daily  . thiamine  100 mg Oral Daily   Or  . thiamine  100 mg Intravenous Daily   Continuous Infusions: . dextrose 5 % and 0.45 % NaCl with KCl 20 mEq/L 100 mL/hr at 02/27/15 2147    PRN Meds:.acetaminophen (TYLENOL) oral liquid 160 mg/5 mL, HYDROcodone-acetaminophen, ibuprofen, LORazepam, morphine injection, promethazine, promethazine **OR** promethazine REVIEW OF SYSTEMS:   Limited, as patient unable to talk, nods yes or no.  Constitutional: Denies fevers, chills or abnormal night sweats Eyes: Denies blurriness of vision, double vision or watery eyes Ears, nose, mouth, throat, and face: Denies mucositis, reports progressive dysphagia, unable to swallow, with severe throat pain and changes in his voice. Respiratory: Denies cough, dyspnea at rest or wheezes. He does report dyspnea on exertion Cardiovascular: Denies palpitations, chest discomfort or lower extremity swelling Gastrointestinal:  Denies nausea, he has intermittent heartburn, denies change in bowel habits. He has lost 30-40 lbs over the last 6 months Skin: Denies abnormal skin rashes Lymphatics: Denies new lymphadenopathy or easy bruising Neurological:Denies numbness, tingling or new weaknesses Behavioral/Psych: Mood is stable, no new changes  All other systems were reviewed with the patient and are negative.  PHYSICAL EXAMINATION: ECOG PERFORMANCE STATUS 2  Filed Vitals:   02/28/15 0625  BP: 124/84  Pulse: 98  Temp: 98.3 F (36.8 C)  Resp: 18   Filed Weights   02/22/15 1010 02/22/15 1458  Weight: 141 lb (63.957 kg) 137 lb 9.1 oz (62.4 kg)    GENERAL:alert, no distress and ill appearing SKIN: skin color, texture, turgor are normal, no rashes or significant lesions EYES: normal, conjunctiva are pink and non-injected, sclera clear OROPHARYNX: no apparent exudate, no erythema and lips, buccal mucosa odorous, and tongue normal . Very poor dentition is noted NECK: tracheostomy site with copious odorous mucous secretions noted.  LYMPH: palpable lymphadenopathy in the  Right cervical, axillary or inguinal LUNGS: no wheezing or rhonchi. Bibasilar crackles noted with normal breathing effort HEART:  regular rate & rhythm and no murmurs and no lower extremity edema ABDOMEN:abdomen soft, non-tender and normal bowel sounds. Feeding tube site looks okay Musculoskeletal:no cyanosis of digits and no clubbing  PSYCH: alert & oriented x 3 with difficulties with articulation due to presence of throat pain and calculus [place] NEURO: no focal motor/sensory deficits  LABORATORY DATA:  I have reviewed the data as listed Lab Results  Component Value Date   WBC 5.2 02/28/2015   HGB 11.1* 02/28/2015   HCT 31.8* 02/28/2015   MCV 95.8 02/28/2015   PLT 181 02/28/2015   Lab Results  Component Value Date   NA 135 02/14/2015   K 4.2 02/14/2015   CL 100* 02/14/2015   CO2 24 02/14/2015    RADIOGRAPHIC STUDIES: I reviewed the CT scan myself  Ct Abdomen Wo Contrast  02/27/2015   COMPARISON:  03/24/2008  FINDINGS: Consolidation in the left lung base likely to represent pneumonia. There is mucous plugging in the left lower lobe bronchi. Mild bronchial wall thickening. Patchy emphysematous changes in the lungs.  The unenhanced appearance of the liver, spleen, gallbladder, pancreas, adrenal glands, kidneys, abdominal aorta, inferior vena cava,  and retroperitoneal lymph nodes is unremarkable. Stomach, small bowel, and colon are not abnormally distended. Contrast material demonstrated in the colon. No free air or free fluid in the abdomen. Visualized bones appear intact. No destructive bone lesions.  IMPRESSION: No acute process demonstrated in the abdomen on unenhanced imaging. Contrast material throughout the colon.   Electronically Signed   By: Lucienne Capers M.D.   On: 02/27/2015 21:35   Dg Chest 2 View  02/25/2015   CLINICAL DATA:  Aspiration into the airway  EXAM: CHEST  2 VIEW  COMPARISON:  01/21/2015  FINDINGS: Patchy left lower lobe opacity, suspicious for pneumonia. No pleural effusion or pneumothorax.  Tracheostomy in satisfactory position.  The heart is normal in size.  Visualized osseous structures  are within normal limits.  IMPRESSION: Patchy left lower lobe opacity, suspicious for pneumonia.   Electronically Signed   By: Julian Hy M.D.   On: 02/25/2015 18:08   Dg Swallowing Func-speech Pathology  02/26/2015  CLINICAL IMPRESSIONS 02/26/2015  Therapy Diagnosis Moderate pharyngeal phase dysphagia  Clinical Impression Pt presents with a structural and respiratory based  dysphagia due to presence of pharyngeal tumor and lack of redirection of  air to upper airway to cough. The pts swallow is strong and timely, but  all PO collect around tumor and mix with pooled standing secretions and  are silently aspirated post swallow. The pt is unable to clear aspirate  due to inability to tolerate PMSV or redirect air past trach. SLP used  finger occlusion during exam to aid in expectoration (Pt could not  maintain PMSV in place due to air trapping) and minimal movement of upper  airway secretions/barium visible with most secretions being forced out of  the stoma below the trach, indicating very little upper airway patency.  significant quantity of barium also seen at trach site post test. Until pt  is able to tolerate PMSV to clear upper airway and utilize protective  cough, PO tolerance is unlikely and aspiration risk will be high.  Recommend NPO until trach change tomorrow. Will follow for trials then.       CHL IP TREATMENT RECOMMENDATION 02/23/2015  Treatment Recommendations Therapy as outlined in treatment plan below     CHL IP DIET RECOMMENDATION 02/26/2015  SLP Diet Recommendations NPO;Ice chips PRN after oral care  Liquid Administration via (None)  Medication Administration Via alternative means  Compensations (None)  Postural Changes and/or Swallow Maneuvers (None)     CHL IP OTHER RECOMMENDATIONS 02/26/2015  Recommended Consults (None)  Oral Care Recommendations Oral care QID;Other (Comment)  Other Recommendations (None)     CHL IP FOLLOW UP RECOMMENDATIONS 02/26/2015  Follow up Recommendations Home health  SLP     CHL IP FREQUENCY AND DURATION 02/23/2015  Speech Therapy Frequency (ACUTE ONLY) min 3x week  Treatment Duration 1 week     Pertinent Vitals/Pain NA    SLP Swallow Goals No flowsheet data found.  No flowsheet data found.    CHL IP REASON FOR REFERRAL 02/26/2015  Reason for Referral Objectively evaluate swallowing function             No flowsheet data found.  No flowsheet data found.         [name], [name] 02/26/2015, 1:56 PM    COMPARISON: None.  FINDINGS: Visualized portions of the brain are unremarkable. Orbits are unremarkable.  Chronic moderate mucosal thickening present within the right maxillary sinus. Visualized paranasal sinuses are otherwise clear.No mastoid effusion. Middle ear cavities are  clear.The salivary glands including the parotid glands and submandibular glands are normal.  The oral cavity is within normal limits. Base of tongue demonstrates no acute abnormality. Palatine tonsils within normal limits.Oropharynx and nasopharynx are clear. No retropharyngeal collection.Vallecula is clear.  There is an ill-defined heterogeneously enhancing mass centered at the right piriform sinus that measures approximately 3.2 x 2.9 x 3.8 cm (AP x transverse x craniocaudad) (series 2, image 81). The right piriform sinus is largely effaced. There is abnormal thickening and enhancement of the right aryepiglottic fold. The mass appears to infiltrate into the pre epiglottic fat anteriorly and laterally through the parapharyngeal fat towards the right carotid space. The mass extends inferiorly along the right posterolateral margin of the hypopharynx/supraglottic larynx to just above the right arytenoid cartilage. There is likely involvement of the right false cord and right laryngeal ventricle. The right true cord is not definitely involved, although this is entirely possible due to the infiltrative nature of the lesion. Tumor and/or secretion present at the level of the left false cord as  well.  There are several hyper enhancing right level IIa lymph nodes measuring up to 11 mm in short axis, concerning for nodal metastases. Central hypodensity within these nodes suggestive of necrosis. A few small hyper enhancing nodes measuring up to 5 mm slightly inferiorly at right level 2 are also suspected to be involved (series 2, image 6). No left-sided adenopathy identified.  Thyroid gland is normal.  Visualized superior mediastinum within normal limits.    Centrilobular and paraseptal emphysematous changes present within the visualized lung apices. Prominent bulla present at the right ung apex. No pulmonary nodule identified. Small 3 mm subpleural nodular density within the peripheral left upper lobe on axial sequence appears more linear in nature on coronal sequence (series  2, image 140 for on axial sequence, series 5, image 80 on coronal sequence).  Normal intravascular enhancement seen within the neck.  No acute osseous abnormality. No worrisome lytic or blastic osseous lesion.  IMPRESSION: 1. Heterogeneously enhancing infiltrative mass centered at the right piriform sinus as above, worrisome for primary supraglottic carcinoma. 2. Enhancing right level 2 lymph nodes as above, worrisome for nodal metastases.  FPathology reportINAL DIAGNOSIS Pathology Report Accession: [id number] 1. 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 2. Larynx, biopsy, Right lateral arytenoid area - INVASIVE SQUAMOUS CELL CARCINOMA, SEE COMMENT. 3. Vocal cord, biopsy, Right false cord - BENIGN VOCAL CORD. - NEGATIVE FOR ATYPIA OR MALIGNANCY. Microscopic Comment 1. , 2. These parts were reviewed by Dr Saralyn Pilar who concurs. A p16 immunostain is pending (part 2) and will be reported in an addendum. (CRR:ecj 02/23/2015) [name] RUND DO Pathologist, Electronic Signature (Case signed 02/23/2015)  REPORT OF SURGICAL PATHOLOGY ADDITIONAL  INFORMATION: 2. The invasive tumor demonstrates absence of p16 immunostain expression. 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  Prognosis would depend on its results.  He is expected to receive radiation as outpatient as local disease is suspected. He will be a good candidate for concurrent chemoradiation therapy if repeat PET CT scan showed no evidence of distant metastatic  disease.  He will benefit from port placement. However, I am concerned about recent aspiration pneumonia and poor dentition that would predispose him with infection. I will arrange port placement as an outpatient. We will continue supportive care for now.   Protein Calorie Malnutrition He has placement of feeding tube today.  Appreciate Nutrition and Speech pathology involvement Hopefully, he can start nutritional feeding as soon as possible  Mouth/throat pain. Due to malignancy  His pain appears to be  controlled with current pain regimen.  Poor dentition He would benefit from dental consultation in the near future and possible teeth extraction prior to start of radiation treatment.   Nausea The patient has some nausea but no vomiting.  Recommend we continue current anti-emetics as prescribed.  Patient does not appear to have signs of dehydration.    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

## 2015-02-28 NOTE — Procedures (Signed)
Interventional Radiology Procedure Note  Procedure: Fluoro guided percutaneous gastrostomy tube.  62F balloon retention.  Complications: None Recommendations:    - low wall suction decompression for 24 hours - Do not use tube for 24 hours.  - Routine care   Signed,  Dulcy Fanny. Earleen Newport, DO

## 2015-02-28 NOTE — Progress Notes (Signed)
6 Days Post-Op  Subjective: Doing well.  Tolerating Passy-Muir valve.  Due to have G-tube placed by IR shortly.  Appreciate Rad/Onc input.  Objective: Vital signs in last 24 hours: Temp:  [98 F (36.7 C)-100 F (37.8 C)] 98 F (36.7 C) (07/13 1330) Pulse Rate:  [76-98] 89 (07/13 1330) Resp:  [16-22] 18 (07/13 1330) BP: (124-135)/(80-84) 135/83 mmHg (07/13 1330) SpO2:  [91 %-99 %] 94 % (07/13 1330) FiO2 (%):  [28 %] 28 % (07/13 1330) Last BM Date: 02/26/15  Intake/Output from previous day: 07/12 0701 - 07/13 0700 In: 2405 [I.V.:2405] Out: 2200 [Urine:2200] Intake/Output this shift:    General appearance: alert, cooperative and no distress Neck: #6 cuffless Shiley tube in place with P-M valve.  Wound somewhat soupy.  Lab Results:   Recent Labs  02/28/15 0333  WBC 5.2  HGB 11.1*  HCT 31.8*  PLT 181   BMET No results for input(s): NA, K, CL, CO2, GLUCOSE, BUN, CREATININE, CALCIUM in the last 72 hours. PT/INR  Recent Labs  02/28/15 0333  LABPROT 14.9  INR 1.15   ABG No results for input(s): PHART, HCO3 in the last 72 hours.  Invalid input(s): PCO2, PO2  Studies/Results: Ct Abdomen Wo Contrast  02/27/2015   CLINICAL DATA:  Evaluate for G-tube placement. History of throat cancer. Dysphagia.  EXAM: CT ABDOMEN WITHOUT CONTRAST  TECHNIQUE: Multidetector CT imaging of the abdomen was performed following the standard protocol without IV contrast.  COMPARISON:  03/24/2008  FINDINGS: Consolidation in the left lung base likely to represent pneumonia. There is mucous plugging in the left lower lobe bronchi. Mild bronchial wall thickening. Patchy emphysematous changes in the lungs.  The unenhanced appearance of the liver, spleen, gallbladder, pancreas, adrenal glands, kidneys, abdominal aorta, inferior vena cava, and retroperitoneal lymph nodes is unremarkable. Stomach, small bowel, and colon are not abnormally distended. Contrast material demonstrated in the colon. No free air  or free fluid in the abdomen. Visualized bones appear intact. No destructive bone lesions.  IMPRESSION: No acute process demonstrated in the abdomen on unenhanced imaging. Contrast material throughout the colon.   Electronically Signed   By: Lucienne Capers M.D.   On: 02/27/2015 21:35    Anti-infectives: Anti-infectives    Start     Dose/Rate Route Frequency Ordered Stop   02/28/15 0800  ceFAZolin (ANCEF) IVPB 2 g/50 mL premix    Comments:  Hang ON CALL to Radiology 7/13   2 g 100 mL/hr over 30 Minutes Intravenous To Radiology 02/27/15 1450 03/01/15 0800      Assessment/Plan: Right supraglottic carcinoma, dysphagia, malnutrition, alcohol dependence s/p Procedure(s) with comments: TRACHEOSTOMY (N/A) - awake tracheostomy DIRECT LARYNGOSCOPY (N/A) - direct laryngoscopy with biopsy G-tube to be placed by IR.  Discharge planning pending social work input.  The patient is interested in pursuing chemo/radiation treatment for his cancer.  I spoke with Dr. Isidore Moos who will arrange a heme/onc appointment.  A PET scan will be needed as an outpatient.  Will be ready for discharge after feeding tube stabilized.  LOS: 6 days    Marvin [name] 02/28/2015

## 2015-02-28 NOTE — Progress Notes (Signed)
Heparin sq given this am ,however reported to next shift to hold 2pm dose as instructed by PA.

## 2015-02-28 NOTE — Clinical Social Work Note (Signed)
Patient reports that he lives with his sister, and is not homeless as per CSW consult.  CSW will continue to follow in case patient needs SNF placement for Trach.  [name]. Davenport, MSW, [place] 02/28/2015 11:27 AM

## 2015-02-28 NOTE — Progress Notes (Signed)
SLP Cancellation Note  Patient Details Name: Marvin [name] MRN: [id number] DOB: [birth year]   Cancelled treatment:       Reason Eval/Treat Not Completed: Patient at procedure or test/unavailable- radiology for PEG.    [name] 02/28/2015, 3:20 PM

## 2015-03-01 ENCOUNTER — Telehealth: Payer: Self-pay | Admitting: *Deleted

## 2015-03-01 ENCOUNTER — Encounter (HOSPITAL_COMMUNITY): Payer: Self-pay | Admitting: Dentistry

## 2015-03-01 ENCOUNTER — Inpatient Hospital Stay (HOSPITAL_COMMUNITY): Payer: Medicaid Other

## 2015-03-01 DIAGNOSIS — K0530 Chronic periodontitis, unspecified: Secondary | ICD-10-CM

## 2015-03-01 DIAGNOSIS — G893 Neoplasm related pain (acute) (chronic): Secondary | ICD-10-CM

## 2015-03-01 DIAGNOSIS — K029 Dental caries, unspecified: Secondary | ICD-10-CM

## 2015-03-01 LAB — TSH: TSH: 0.617 u[IU]/mL (ref 0.350–4.500)

## 2015-03-01 NOTE — Progress Notes (Signed)
Helping RT covering floor- Upon entering room, noted trach appeared dislodged.  No distress noted, VSS, pt denies SOB.  Called RN to bedside- RN paged ENT MD.  RT covering floor came to eval pt, unable to pass suction catheter.  Dr Constance Holster came to bedside and re-inserted trach.  No apparent complications noted, + easy cap color change (purple to yellow).  Sat 98-100% on 28% ATC.  Pt able to speak, no distress noted, VSS.

## 2015-03-01 NOTE — Progress Notes (Signed)
Patient ID: Marvin [name], male   DOB: 15-Dec-1975, 39 y.o.   MRN: [id number] Called because tracheostomy dislodged. He was in no distress. Large amount of secretions were suctioned out. The #6 tracheostomy was replaced without difficulty and secured in place with a fresh straps. Call if any additional problems arise.

## 2015-03-01 NOTE — Consult Note (Signed)
DENTAL CONSULTATION  Date of Consultation:  03/01/2015 Patient Name:   Marvin [name] Date of Birth:   Dec 18, 1975 Medical Record Number: [id number]  VITALS: BP 128/85 mmHg  Pulse 114  Temp(Src) 99.7 F (37.6 C) (Oral)  Resp 16  Ht 6\' 1"  (1.854 m)  Wt 137 lb 9.1 oz (62.4 kg)  BMI 18.15 kg/m2  SpO2 94%  CHIEF COMPLAINT: Patient referred by Dr. Alvy Bimler for evaluation of poor dentition.  HPI: REID REGAS is a 39 year old male recently diagnosed with squamous cell carcinoma of the supraglottic larynx. Patient with anticipated chemoradiation therapy. Patient is now seen as part of a pre-chemoradiation therapy dental protocol examination.  The patient currently denies acute toothaches, swellings, or abscesses. Patient has not seen a dentist for "a long, long time". Patient denies having a primary dentist.  Patient indicates his maxillary anterior teeth numbers 8 and 9 have been broken off for " a while'.  PROBLEM LIST: Patient Active Problem List   Diagnosis Date Noted  . Protein-calorie malnutrition, severe 02/27/2015  . Dysphagia   . Malnutrition   . 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 Transaminitis 04/19/2012  . Alcohol dependence 04/17/2012    PMH: Past Medical History  Diagnosis Date  . ETOH abuse   . Alcohol dependence 04/17/2012  . COPD (chronic obstructive pulmonary disease)   . Shortness of breath dyspnea     occ  . Anxiety   . GERD (gastroesophageal reflux disease)   . Seizures     one episode 09/2012, suspected related to ETOH withdrawal    PSH: Past Surgical History  Procedure Laterality Date  . Knee surgery Left     kneecap -screws  . Appendectomy    . Tracheostomy tube placement N/A 02/22/2015    Procedure: TRACHEOSTOMY;  Surgeon: [name], MD;  Location: [place];  Service: ENT;  Laterality: N/A;  awake tracheostomy  . Direct laryngoscopy N/A 02/22/2015    Procedure: DIRECT LARYNGOSCOPY;  Surgeon: [name], MD;  Location: [place];  Service:  ENT;  Laterality: N/A;  direct laryngoscopy with biopsy    ALLERGIES: No Known Allergies  MEDICATIONS: Current Facility-Administered Medications  Medication Dose Route Frequency Provider Last Rate Last Dose  . acetaminophen (TYLENOL) solution 325-650 mg  325-650 mg Oral D6U PRN Jodi Marble, MD   440 mg at 02/28/15 2135  . antiseptic oral rinse (CPC / CETYLPYRIDINIUM CHLORIDE 0.05%) solution 7 mL  7 mL Mouth Rinse q12n4p [name], MD   7 mL at 02/28/15 1334  . bacitracin ointment   Topical BID [name], MD      . chlorhexidine (PERIDEX) 0.12 % solution 15 mL  15 mL Mouth Rinse BID [name], MD   15 mL at 03/01/15 0901  . dextrose 5 % and 0.45 % NaCl with KCl 20 mEq/L infusion   Intravenous Continuous Jodi Marble, MD 347 mL/hr at 03/01/15 0112    . feeding supplement (ENSURE ENLIVE) (ENSURE ENLIVE) liquid 237 mL  237 mL Oral BID BM Maricela Bo Ost[place], RD   237 mL at 02/26/15 1000  . folic acid (FOLVITE) tablet 1 mg  1 mg Oral Daily [name], MD   1 mg at 02/27/15 1000  . heparin injection 5,000 Units  5,000 Units Subcutaneous 3 times per day Monia Sabal, PA-C   5,000 Units at 03/01/15 0550  . HYDROcodone-acetaminophen (HYCET) 7.5-325 mg/15 ml solution 15 mL  15 mL Oral QID PRN [name], MD   15 mL at 03/01/15 0312  .  ibuprofen (ADVIL,MOTRIN) tablet 200-400 mg  200-400 mg Oral X8P PRN Jodi Marble, MD   382 mg at 02/25/15 5053  . LORazepam (ATIVAN) injection 2-3 mg  2-3 mg Intravenous Q1H PRN [name], MD   2 mg at 02/28/15 1850  . morphine 2 MG/ML injection 2-4 mg  2-4 mg Intravenous Q2H PRN [name], MD   2 mg at 03/01/15 0901  . multivitamin with minerals tablet 1 tablet  1 tablet Oral Daily [name], MD   1 tablet at 02/26/15 1043  . neomycin-bacitracin-polymyxin (NEOSPORIN) ointment 1 application  1 application Topical Daily Corrie Mckusick, DO   1 application at 97/67/34 1203  . nicotine (NICODERM CQ - dosed in mg/24 hours) patch 21 mg  21 mg Transdermal  Daily [name], MD   21 mg at 03/01/15 1203  . promethazine (PHENERGAN) injection 12.5 mg  12.5 mg Intravenous Q6H PRN [name], MD   12.5 mg at 02/24/15 0603  . promethazine (PHENERGAN) tablet 12.5 mg  12.5 mg Oral Q6H PRN [name], MD       Or  . promethazine (PHENERGAN) suppository 12.5 mg  12.5 mg Rectal Q6H PRN [name], MD   12.5 mg at 02/22/15 2359  . thiamine (VITAMIN B-1) tablet 100 mg  100 mg Oral Daily [name], MD   100 mg at 02/26/15 1043   Or  . thiamine (B-1) injection 100 mg  100 mg Intravenous Daily [name], MD   100 mg at 03/01/15 1203    LABS: Lab Results  Component Value Date   WBC 5.2 02/28/2015   HGB 11.1* 02/28/2015   HCT 31.8* 02/28/2015   MCV 95.8 02/28/2015   PLT 181 02/28/2015      Component Value Date/Time   NA 135 02/14/2015 1439   K 4.2 02/14/2015 1439   CL 100* 02/14/2015 1439   CO2 24 02/14/2015 1439   GLUCOSE 81 02/14/2015 1439   BUN <5* 02/14/2015 1439   CREATININE 0.65 02/22/2015 1731   CALCIUM 8.5* 02/14/2015 1439   GFRNONAA >60 02/22/2015 1731   GFRAA >60 02/22/2015 1731   Lab Results  Component Value Date   INR 1.15 02/28/2015   INR 1.06 02/14/2015   INR 1.0 02/19/2009   No results found for: PTT  SOCIAL HISTORY: History   Social History  . Marital Status: Single    Spouse Name: N/A  . Number of Children: N/A  . Years of Education: N/A   Occupational History  . Not on file.   Social History Main Topics  . Smoking status: Current Every Day Smoker -- 1.00 packs/day for 28 years    Types: Cigarettes  . Smokeless tobacco: Never Used  . Alcohol Use: Yes     Comment: 6 -40-oz. beers daily  . Drug Use: No  . Sexual Activity: Not on file   Other Topics Concern  . Not on file   Social History Narrative    FAMILY HISTORY: Family History  Problem Relation Age of Onset  . Cancer Mother     REVIEW OF SYSTEMS: Reviewed from chart for this admission.   DENTAL HISTORY: CHIEF  COMPLAINT: Patient referred by Dr. Alvy Bimler for evaluation of poor dentition.  HPI: EMONTE DIEUJUSTE is a 39 year old male recently diagnosed with squamous cell carcinoma of the supraglottic larynx. Patient with anticipated chemoradiation therapy. Patient is now seen as part of a pre-chemoradiation therapy dental protocol examination.  The patient currently denies acute toothaches, swellings, or abscesses. Patient has  not seen a dentist for "a long, long time". Patient denies having a primary dentist.  Patient indicates his maxillary anterior teeth numbers 8 and 9 have been broken off for " a while'.   DENTAL EXAMINATION: GENERAL:The patient is a well-developed, slightly built male in no acute distress.  HEAD AND NECK:Patient has tracheostomy in place. Patient denies acute TMJ symptoms.  INTRAORAL EXAM:Patient has normal saliva. I do not see any evidence of oral abscess formation today. Patient has bilateral mandibular lingual tori.  DENTITION:Patient is not missing any teeth. Tooth numbers 8 and 9 are present as retained root segments.  PERIODONTAL: The patient has chronic periodontitis with plaque and calculus accumulations, generalized gingival recession, and incipient tooth mobility. Patient has moderate to severe bone loss.  DENTAL CARIES/SUBOPTIMAL RESTORATIONS:Dental caries are noted to be affecting tooth numbers 8 and 9 and 16. I would need a full series of dental radiographs to rule out other incipient dental caries.   ENDODONTIC: The patient denies having acute pulpitis symptoms. Patient does appear to have periapical pathology associated with tooth numbers 8 and 9 and possibly 1, 2, 15, and 16.  CROWN AND BRIDGE:There are no crown or bridge restorations.  PROSTHODONTIC:There are no partial dentures.  OCCLUSION:Patient has a poor occlusal scheme but a stable occlusion at this time.  RADIOGRAPHIC INTERPRETATION: An orthopantogram was taken today. There are no missing teeth. Tooth  numbers 8 and 9 are present as retained root segments. Dental caries are noted. There is moderate to severe bone loss. Several dental restorations are noted. There appears to be periapical pathology associated with tooth numbers 1, 2, 8, 9, 15, and 16.  ASSESSMENTS: 1. Squamous cell carcinoma of the supraglottic larynx 2. Tracheostomy placement by Dr. Redmond Baseman 3. Pre-chemoradiation therapy dental protocol 4. Multiple retained root segments 5. Chronic apical periodontitis 6. Chronic periodontitis with bone loss 7. Gingival recession 8. Accretions 9. Tooth mobility 10. Poor occlusal scheme but a stable occlusion 11. History of oral neglect   PLAN/RECOMMENDATIONS: 1. I discussed the risks, benefits, and complications of various treatment options with the patient in relationship to his medical and dental conditions, anticipated chemoradiation therapy, chemoradiation therapy side effects to include xerostomia, radiation caries, trismus, mucositis, taste changes, gum and jawbone changes, and risk for infection, bleeding, and osteoradionecrosis. We discussed various treatment options to include no treatment, multiple extractions with alveoloplasty, pre-prosthetic surgery as indicated, periodontal therapy, dental restorations, root canal therapy, crown and bridge therapy, implant therapy, and replacement of missing teeth as indicated. The patient currently wishes to proceed with multiple extractions with alveoloplasty and gross debridement of remaining dentition in the operating room with general anesthesia.  This has been scheduled for Monday, 03/05/2015 at 12 noon. Patient will then need to weeks of healing prior to start of chemoradiation therapy. We will discuss need for scatter protection devices with Dr. Isidore Moos prior to the simulation appointment. Fluoride trays will need to be fabricated in the future.  2. Discussion of findings with medical team and coordination of future medical and dental care as  needed.   Lenn Cal, DDS

## 2015-03-01 NOTE — Progress Notes (Signed)
Referring Physician(s): Dr Redmond Baseman  Subjective:  Perc G tube placed 7/13 in IR Doing ok this am Only pain is to cough per pt Tmax 102.2 last night---now 99.7   Allergies: Review of patient's allergies indicates no known allergies.  Medications: Prior to Admission medications   Medication Sig Start Date End Date Taking? Authorizing Provider  HYDROcodone-acetaminophen (HYCET) 7.5-325 mg/15 ml solution Take 15 mLs by mouth 4 (four) times daily as needed for moderate pain. 01/22/15 01/22/16 Yes Linton Flemings, MD  naproxen (NAPROSYN) 500 MG tablet Take 1 tablet (500 mg total) by mouth 2 (two) times daily. Patient not taking: Reported on 02/14/2015 10/09/14   Britt Bottom, NP  sulfamethoxazole-trimethoprim (SEPTRA DS) 800-160 MG per tablet Take 1 tablet by mouth 2 (two) times daily. Patient not taking: Reported on 10/09/2014 08/21/13   Clayton Bibles, PA-C     Vital Signs: BP 128/85 mmHg  Pulse 86  Temp(Src) 99.7 F (37.6 C) (Oral)  Resp 16  Ht 6\' 1"  (1.854 m)  Wt 137 lb 9.1 oz (62.4 kg)  BMI 18.15 kg/m2  SpO2 94%  Physical Exam  Abdominal: Soft. He exhibits no distension. There is no tenderness.  Site of G tube clean and dry NT No bleeding Not to wall suction   Nursing note and vitals reviewed.   Imaging: Ct Abdomen Wo Contrast  02/27/2015   CLINICAL DATA:  Evaluate for G-tube placement. History of throat cancer. Dysphagia.  EXAM: CT ABDOMEN WITHOUT CONTRAST  TECHNIQUE: Multidetector CT imaging of the abdomen was performed following the standard protocol without IV contrast.  COMPARISON:  03/24/2008  FINDINGS: Consolidation in the left lung base likely to represent pneumonia. There is mucous plugging in the left lower lobe bronchi. Mild bronchial wall thickening. Patchy emphysematous changes in the lungs.  The unenhanced appearance of the liver, spleen, gallbladder, pancreas, adrenal glands, kidneys, abdominal aorta, inferior vena cava, and retroperitoneal lymph nodes is  unremarkable. Stomach, small bowel, and colon are not abnormally distended. Contrast material demonstrated in the colon. No free air or free fluid in the abdomen. Visualized bones appear intact. No destructive bone lesions.  IMPRESSION: No acute process demonstrated in the abdomen on unenhanced imaging. Contrast material throughout the colon.   Electronically Signed   By: Lucienne Capers M.D.   On: 02/27/2015 21:35   Dg Chest 2 View  02/25/2015   CLINICAL DATA:  Aspiration into the airway  EXAM: CHEST  2 VIEW  COMPARISON:  01/21/2015  FINDINGS: Patchy left lower lobe opacity, suspicious for pneumonia. No pleural effusion or pneumothorax.  Tracheostomy in satisfactory position.  The heart is normal in size.  Visualized osseous structures are within normal limits.  IMPRESSION: Patchy left lower lobe opacity, suspicious for pneumonia.   Electronically Signed   By: Julian Hy M.D.   On: 02/25/2015 18:08   Ir Gastrostomy Tube Mod Sed  02/28/2015   CLINICAL DATA:  39 year old male with a history of head and neck carcinoma. He has been referred for percutaneous gastrostomy tube, pending therapy for piriform sinus carcinoma.  EXAM: PERCUTANEOUS GASTROSTOMY  FLUOROSCOPY TIME:  2 minutes, 0 seconds  MEDICATIONS AND MEDICAL HISTORY: Versed 2.0 mg, Fentanyl 100 mcg.  ANESTHESIA/SEDATION: Moderate sedation time: 30 minutes  CONTRAST:  15 cc enteric contrast  PROCEDURE: The procedure, risks, benefits, and alternatives were explained to the patient and the patient's family. Questions regarding the procedure were encouraged and answered. The patient understands and consents to the procedure.  Initial fluoroscopic scout images performed.  Before proceeding, a multipurpose catheter was advanced as an orogastric tube for insufflation of the stomach under fluoroscopy. Location was confirmed with fluoroscopy.  The stomach was insufflated.  Skin and subcutaneous tissues of the left upper quadrant were generously infiltrated  with 1% lidocaine for local anesthesia. Subcutaneous tissues in the muscle air were also generously infiltrated with lidocaine for anesthesia.  84 gauge needle was then advanced under fluoroscopy into the lumen of the stomach. The location the tip of the needle was confirmed with infusion of contrast into the stomach lumen. A T-tack was deposited. Stiff Amplatz wire was advanced into the lumen of the stomach. A small incision was made with 11 blade scalpel.  Two additional T-tacks were deposited.  Serial dilation to 20 French diameter was performed over the Amplatz wire. A 20 French peel-away sheath was then placed. An 38 French balloon retention gastrostomy tube was then placed through the peel-away sheath. Once the peel-away sheath was removed, location of the gastrostomy tube was confirmed by infusion of a small amount of contrast. 9 cc of fluid was used to inflate the gastrostomy tube.  Patient tolerated the procedure well and remained hemodynamically stable throughout.  No complications were encountered and no significant blood loss was encountered.  FINDINGS: The image demonstrates placement of a 71 French balloon retention percutaneous gastrostomy tube.  IMPRESSION: Status post placement of percutaneous gastrostomy tube with 84 French balloon retention tube placed.  Signed,  Dulcy Fanny. Earleen Newport, DO  [facility]  [facility].  PLAN: The gastrostomy tube should be low wall suction for 24 hours. The tube should not be used for the first 24 hours.  Routine care at the skin site.   Electronically Signed   By: Corrie Mckusick D.O.   On: 02/28/2015 16:48   Dg Swallowing Func-speech Pathology  02/26/2015    Objective Swallowing Evaluation:    Patient Details  Name: Marvin [name] MRN: [id number] Date of Birth: Nov 17, 1975  Today's Date: 02/26/2015 Time: SLP Start Time (ACUTE ONLY): 1140-SLP Stop Time (ACUTE ONLY): 1200 SLP Time Calculation (min) (ACUTE ONLY): 20 min  Past  Medical History:  Past Medical History  Diagnosis Date  . ETOH abuse   . Alcohol dependence 04/17/2012  . COPD (chronic obstructive pulmonary disease)   . Shortness of breath dyspnea     occ  . Anxiety   . GERD (gastroesophageal reflux disease)   . Seizures     one episode 09/2012, suspected related to ETOH withdrawal   Past Surgical History:  Past Surgical History  Procedure Laterality Date  . Knee surgery Left     kneecap -screws  . Appendectomy    . Tracheostomy tube placement N/A 02/22/2015    Procedure: TRACHEOSTOMY;  Surgeon: [name], MD;  Location: [place];   Service: ENT;  Laterality: N/A;  awake tracheostomy  . Direct laryngoscopy N/A 02/22/2015    Procedure: DIRECT LARYNGOSCOPY;  Surgeon: [name], MD;  Location:  [facility];  Service: ENT;  Laterality: N/A;  direct laryngoscopy with biopsy   HPI:  Other Pertinent Information: 39 yo male with h/o ETOH abuse, COPD,  anxiety, GERD, and seizures (suspect related to ETOH withdrawal), who  presents with c/o throat pain, dysphagia, and changes in vocal quality  that have worsened over the last two months. 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 Per MD note, there is associated  narrowing of the airway.  No Data Recorded  Assessment / Plan /  Recommendation CHL IP CLINICAL IMPRESSIONS 02/26/2015  Therapy Diagnosis Moderate pharyngeal phase dysphagia  Clinical Impression Pt presents with a structural and respiratory based  dysphagia due to presence of pharyngeal tumor and lack of redirection of  air to upper airway to cough. The pts swallow is strong and timely, but  all PO collect around tumor and mix with pooled standing secretions and  are silently aspirated post swallow. The pt is unable to clear aspirate  due to inability to tolerate PMSV or redirect air past trach. SLP used  finger occlusion during exam to aid in expectoration (Pt could not  maintain PMSV in place due to air trapping) and minimal movement of upper  airway  secretions/barium visible with most secretions being forced out of  the stoma below the trach, indicating very little upper airway patency.  significant quantity of barium also seen at trach site post test. Until pt  is able to tolerate PMSV to clear upper airway and utilize protective  cough, PO tolerance is unlikely and aspiration risk will be high.  Recommend NPO until trach change tomorrow. Will follow for trials then.       CHL IP TREATMENT RECOMMENDATION 02/23/2015  Treatment Recommendations Therapy as outlined in treatment plan below     CHL IP DIET RECOMMENDATION 02/26/2015  SLP Diet Recommendations NPO;Ice chips PRN after oral care  Liquid Administration via (None)  Medication Administration Via alternative means  Compensations (None)  Postural Changes and/or Swallow Maneuvers (None)     CHL IP OTHER RECOMMENDATIONS 02/26/2015  Recommended Consults (None)  Oral Care Recommendations Oral care QID;Other (Comment)  Other Recommendations (None)     CHL IP FOLLOW UP RECOMMENDATIONS 02/26/2015  Follow up Recommendations Home health SLP     CHL IP FREQUENCY AND DURATION 02/23/2015  Speech Therapy Frequency (ACUTE ONLY) min 3x week  Treatment Duration 1 week     Pertinent Vitals/Pain NA    SLP Swallow Goals No flowsheet data found.  No flowsheet data found.    CHL IP REASON FOR REFERRAL 02/26/2015  Reason for Referral Objectively evaluate swallowing function             No flowsheet data found.  No flowsheet data found.         [name], [name] 02/26/2015, 1:56 PM     Labs:  CBC:  Recent Labs  02/14/15 1439 02/22/15 1731 02/25/15 0240 02/28/15 0333  WBC 4.4 5.8 6.4 5.2  HGB 13.4 13.4 11.8* 11.1*  HCT 39.0 38.5* 34.2* 31.8*  PLT 122* 111* 85* 181    COAGS:  Recent Labs  02/14/15 1439 02/28/15 0333  INR 1.06 1.15  APTT 35 35    BMP:  Recent Labs  01/21/15 2033 02/14/15 1439 02/22/15 1731  NA 133* 135  --   K 3.6 4.2  --   CL 94* 100*  --   CO2  --  24  --   GLUCOSE 90 81  --     BUN <3* <5*  --   CALCIUM  --  8.5*  --   CREATININE 1.20 0.46* 0.65  GFRNONAA  --  >60 >60  GFRAA  --  >60 >60    LIVER FUNCTION TESTS:  Recent Labs  02/14/15 1439  BILITOT 0.4  AST 56*  ALT 27  ALKPHOS 113  PROT 7.7  ALBUMIN 3.3*    Assessment and Plan:  G tube intact Clean and dry Hook to wall suction til 430 pm; then use RN aware  Signed: Isao Seltzer A 03/01/2015, 7:41 AM   I spent a total of 15 Minutes in face to face in clinical consultation/evaluation, greater than 50% of which was counseling/coordinating care for G tube

## 2015-03-01 NOTE — Telephone Encounter (Addendum)
  Oncology Nurse Navigator Documentation  Referral date to RadOnc/MedOnc: 02/28/15 (03/01/15 1413) Navigator Encounter Type: Telephone (03/01/15 1413)     Spoke with LCSW Cathleen Corti re: patient's DC status.    He stated:  DC expected tomorrow or over weekend pending acceptance by SNF.  He has contacted Public librarian for processing of Medicaid application. I noted patient will need staging PET ASAP following DC.    He suggested notifying attending to ensure need for PET included in DC summary.    I confirmed with patient's RN attending is Dr. [name], routed note to his attention.      [name], RN, BSN, [place] [facility] [place] [phone]

## 2015-03-01 NOTE — Progress Notes (Signed)
Speech Language Pathology Treatment: Nada Boozer Speaking valve;Dysphagia  Patient Details Name: Marvin [name] MRN: [id number] DOB: 1976-03-23 Today's Date: 03/01/2015 Time: 6063-0160 SLP Time Calculation (min) (ACUTE ONLY): 13 min  Assessment / Plan / Recommendation Clinical Impression  S/P G-tube placement yesterday.  Pt has been wearing PMV all waking hours.  Tolerates well with low-volume voice, but improved clarity.  Able to don/doff valve independently.  Reviewed/demonstrated to pt how to clean valve and encouraged him to do so nightly.  Pt has been consuming limited ice chips/sips of water and verbalizes desire to eat greater variety of POs. No overt s/s of aspiration noted at bedside; pt's aspiration not reliably accompanied by cough per prior MBS>   Consider repeating MBS prior to D/C to determine safest POs - primary nutrition will be via PEG, but pt hopes to supplement with food/liquid per his preferences.     HPI Other Pertinent Information: 39 yo male with h/o ETOH abuse, COPD, anxiety, GERD, and seizures (suspect related to ETOH withdrawal), who presents with c/o throat pain, dysphagia, and changes in vocal quality that have worsened over the last two months. 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 Per MD note, there is associated narrowing of the airway.   Pertinent Vitals Pain Assessment: No/denies pain  SLP Plan  Continue with current plan of care    Recommendations Diet recommendations:  (sips and chips) Liquids provided via: Straw      Patient may use Passy-Muir Speech Valve: During all waking hours (remove during sleep) PMSV Supervision: Intermittent       Oral Care Recommendations: Oral care QID;Other (Comment) Follow up Recommendations: Skilled Nursing facility Plan: Continue with current plan of care   Tashiya Souders L. Tivis Ringer, [place] CCC/SLP Pager [phone]      [name] 03/01/2015, 2:21 PM

## 2015-03-01 NOTE — Progress Notes (Signed)
Marvin [name]   DOB:Dec 19, 1975   QQ#:[id number]   EPP#:[id number]  Patient Care Team: No Pcp Per Patient as PCP - General (General Practice)  I have seen the patient, examined him and edited the notes as follows  Subjective: Patient seen and examined. No new issues reported overnight. He is feeling better from prior day. No fevers reported. Had a G tube placed on 7/13 without any complications,to start tube feedings today. Denies any shortness of breath, but does report cough with strong sputum production. Denies cardiac complaints. Denies lower extremity swelling. Nausea is controlled, denies heartburn or change in bowel habits. He has minor incisional abdominal discomfort.  Denies any dysuria. Denies abnormal skin rashes, or neuropathy. Denies any bleeding issues such as epistaxis, hematemesis, hematuria or hematochezia.  He had transient episode of febrile episode, resolved spontaneously   Scheduled Meds: . antiseptic oral rinse  7 mL Mouth Rinse q12n4p  . bacitracin   Topical BID  . chlorhexidine  15 mL Mouth Rinse BID  . feeding supplement (ENSURE ENLIVE)  237 mL Oral BID BM  . folic acid  1 mg Oral Daily  . heparin subcutaneous  5,000 Units Subcutaneous 3 times per day  . multivitamin with minerals  1 tablet Oral Daily  . neomycin-bacitracin-polymyxin  1 application Topical Daily  . nicotine  21 mg Transdermal Daily  . thiamine  100 mg Oral Daily   Or  . thiamine  100 mg Intravenous Daily   Continuous Infusions: . dextrose 5 % and 0.45 % NaCl with KCl 20 mEq/L 100 mL/hr at 03/01/15 0112   PRN Meds:.acetaminophen (TYLENOL) oral liquid 160 mg/5 mL, HYDROcodone-acetaminophen, ibuprofen, LORazepam, morphine injection, promethazine, promethazine **OR** promethazine  Objective:  Filed Vitals:   03/01/15 0840  BP:   Pulse: 114  Temp:   Resp: 16      Intake/Output Summary (Last 24 hours) at 03/01/15 1016 Last data filed at 03/01/15 0905  Gross per 24 hour  Intake    805 ml   Output   1750 ml  Net   -945 ml    GENERAL:alert, no distress and ill appearing SKIN: skin color, texture, turgor are normal, no rashes or significant lesions EYES: normal, conjunctiva are pink and non-injected, sclera clear OROPHARYNX: no apparent exudate, no erythema and lips, buccal mucosa odorous, and tongue normal . Very poor dentition is noted NECK: tracheostomy site with copious odorous mucous secretions noted.  LYMPH: palpable lymphadenopathy in the Right cervical, axillary or inguinal LUNGS: no wheezing or rhonchi. Bibasilar crackles noted with normal breathing effort HEART: regular rate & rhythm and no murmurs and no lower extremity edema ABDOMEN:abdomen soft, non-tender and normal bowel sounds. Feeding tube site looks okay Musculoskeletal:no cyanosis of digits and no clubbing  PSYCH: alert & oriented x 3 with difficulties with articulation due to presence of throat pain  NEURO: no focal motor/sensory deficits    CBG (last 3)  No results for input(s): GLUCAP in the last 72 hours.   Labs:   Recent Labs Lab 02/22/15 1731 02/25/15 0240 02/28/15 0333  WBC 5.8 6.4 5.2  HGB 13.4 11.8* 11.1*  HCT 38.5* 34.2* 31.8*  PLT 111* 85* 181  MCV 98.5 96.9 95.8  MCH 34.3* 33.4 33.4  MCHC 34.8 34.5 34.9  RDW 13.9 13.1 12.5     Chemistries:    Recent Labs Lab 02/22/15 1731  CREATININE 0.65     Coagulation profile  Recent Labs Lab 02/28/15 0333  INR 1.15   CBG:  Recent Labs  Lab 02/23/15 0737 02/23/15 2151  GLUCAP 114* 122*    Thyroid function studies  Recent Labs  03/01/15 0341  TSH 0.617   Microbiology  Sputum culture with moderate Gram negative rods and cocci in pairs MRSA negative   Imaging Studies:   Ir Gastrostomy Tube Mod Sed  02/28/2015   MPRESSION: Status post placement of percutaneous gastrostomy tube with 16 French balloon retention tube placed.  Signed,  Dulcy Fanny. Earleen Newport, DO  [facility]   [facility].  PLAN: The gastrostomy tube should be low wall suction for 24 hours. The tube should not be used for the first 24 hours.  Routine care at the skin site.   Electronically Signed   By: Corrie Mckusick D.O.   On: 02/28/2015 16:48    Assessment/Plan: 39 y.o. Newly diagnosed squamous cell carcinoma of the Head & Neck, HPV negative He had a tracheostomy with direct laryngoscopy with biopsy on 7/7 with pathology consistent with [id number] Supraglottic squamous cell carcinoma  CT of the abdomen is negative for metastatic disease Stage of the disease is to be determined, a PET/CT scan will be ordered as outpatient.  Prognosis would depend on its results.  He is expected to receive chemoradiation as outpatient as local disease is suspected. He will be a good candidate for concurrent chemoradiation therapy if repeat PET CT scan showed no evidence of distant metastatic disease.  He will benefit from port placement. However, there is concern about recent aspiration pneumonia and poor dentition that would predispose him with infection. Port placement will be arranged as an outpatient.  We will continue supportive care for now.  I have consulted the dentist for dental extraction  Protein Calorie Malnutrition He has placement of feeding tube on 7/13 Nutritional feeding to be initiated this afternoon    Mouth/throat pain. Due to malignancy His pain appears to be controlled with current pain regimen.  Poor dentition Appreciate dental consultation by Dr. Enrique Sack Possible teeth extraction prior to start of radiation treatment is to be performed.   Nausea The patient has some nausea but no vomiting, in the setting of copious mucus production.  Recommend we continue current anti-emetics as prescribed. Patient does not appear to have signs of dehydration.   Anemia in neoplastic disease Will monitor as outpatient  DVT prophylaxis On Heparin  Tobacco and ETOH  abuse He is on Nicotine and Thiamine protocol Substance Cessation counseling recommended  Full Code Will follow  Other medical issues as per admitting team  I will sign off. I have tentatively made appointment to see him back in the office on 03/12/2015. I gave the patient education handout regarding the name of chemotherapy that I expect to prescribe in the future. Please call if questions arise.   Rondel Jumbo, PA-C 03/01/2015  10:16 AM Avyaan Summer, MD 03/01/2015

## 2015-03-01 NOTE — Clinical Social Work Note (Signed)
CSW met with patient to discuss SNF placement options.  Patient is in agreement to going to a SNF, to help with his trach care and feeding tubes.  Patient states he does not really have any family support, he has a sister, but she is not very involved.  Patient states he is basically homeless and does not have anywhere to go at this time.   R. , MSW, LCSWA [phone] 03/01/2015 10:12 AM  

## 2015-03-01 NOTE — Progress Notes (Signed)
7 Days Post-Op  Subjective: G-tube placed yesterday afternoon.  Some pain with cough but otherwise no complaints.  Objective: Vital signs in last 24 hours: Temp:  [98 F (36.7 C)-102.2 F (39 C)] 99.7 F (37.6 C) (07/14 0540) Pulse Rate:  [82-130] 86 (07/14 0540) Resp:  [13-25] 16 (07/14 0540) BP: (117-157)/(68-96) 128/85 mmHg (07/14 0540) SpO2:  [93 %-100 %] 94 % (07/14 0540) FiO2 (%):  [28 %] 28 % (07/14 0540) Last BM Date: 02/26/15  Intake/Output from previous day: 07/13 0701 - 07/14 0700 In: 805 [I.V.:805] Out: 1050 [Urine:1050] Intake/Output this shift:    General appearance: alert, cooperative and no distress Neck: #6 cuffless Shiley trach tube in place, wound remains somewhat soupy.  Hoarse but decent voice with Passy-Muir valve in place.  Lab Results:   Recent Labs  02/28/15 0333  WBC 5.2  HGB 11.1*  HCT 31.8*  PLT 181   BMET No results for input(s): NA, K, CL, CO2, GLUCOSE, BUN, CREATININE, CALCIUM in the last 72 hours. PT/INR  Recent Labs  02/28/15 0333  LABPROT 14.9  INR 1.15   ABG No results for input(s): PHART, HCO3 in the last 72 hours.  Invalid input(s): PCO2, PO2  Studies/Results: Ct Abdomen Wo Contrast  02/27/2015   CLINICAL DATA:  Evaluate for G-tube placement. History of throat cancer. Dysphagia.  EXAM: CT ABDOMEN WITHOUT CONTRAST  TECHNIQUE: Multidetector CT imaging of the abdomen was performed following the standard protocol without IV contrast.  COMPARISON:  03/24/2008  FINDINGS: Consolidation in the left lung base likely to represent pneumonia. There is mucous plugging in the left lower lobe bronchi. Mild bronchial wall thickening. Patchy emphysematous changes in the lungs.  The unenhanced appearance of the liver, spleen, gallbladder, pancreas, adrenal glands, kidneys, abdominal aorta, inferior vena cava, and retroperitoneal lymph nodes is unremarkable. Stomach, small bowel, and colon are not abnormally distended. Contrast material  demonstrated in the colon. No free air or free fluid in the abdomen. Visualized bones appear intact. No destructive bone lesions.  IMPRESSION: No acute process demonstrated in the abdomen on unenhanced imaging. Contrast material throughout the colon.   Electronically Signed   By: Lucienne Capers M.D.   On: 02/27/2015 21:35   Ir Gastrostomy Tube Mod Sed  02/28/2015   CLINICAL DATA:  39 year old male with a history of head and neck carcinoma. He has been referred for percutaneous gastrostomy tube, pending therapy for piriform sinus carcinoma.  EXAM: PERCUTANEOUS GASTROSTOMY  FLUOROSCOPY TIME:  2 minutes, 0 seconds  MEDICATIONS AND MEDICAL HISTORY: Versed 2.0 mg, Fentanyl 100 mcg.  ANESTHESIA/SEDATION: Moderate sedation time: 30 minutes  CONTRAST:  15 cc enteric contrast  PROCEDURE: The procedure, risks, benefits, and alternatives were explained to the patient and the patient's family. Questions regarding the procedure were encouraged and answered. The patient understands and consents to the procedure.  Initial fluoroscopic scout images performed.  Before proceeding, a multipurpose catheter was advanced as an orogastric tube for insufflation of the stomach under fluoroscopy. Location was confirmed with fluoroscopy.  The stomach was insufflated.  Skin and subcutaneous tissues of the left upper quadrant were generously infiltrated with 1% lidocaine for local anesthesia. Subcutaneous tissues in the muscle air were also generously infiltrated with lidocaine for anesthesia.  73 gauge needle was then advanced under fluoroscopy into the lumen of the stomach. The location the tip of the needle was confirmed with infusion of contrast into the stomach lumen. A T-tack was deposited. Stiff Amplatz wire was advanced into the lumen of the  stomach. A small incision was made with 11 blade scalpel.  Two additional T-tacks were deposited.  Serial dilation to 20 French diameter was performed over the Amplatz wire. A 20 French  peel-away sheath was then placed. An 1 French balloon retention gastrostomy tube was then placed through the peel-away sheath. Once the peel-away sheath was removed, location of the gastrostomy tube was confirmed by infusion of a small amount of contrast. 9 cc of fluid was used to inflate the gastrostomy tube.  Patient tolerated the procedure well and remained hemodynamically stable throughout.  No complications were encountered and no significant blood loss was encountered.  FINDINGS: The image demonstrates placement of a 63 French balloon retention percutaneous gastrostomy tube.  IMPRESSION: Status post placement of percutaneous gastrostomy tube with 22 French balloon retention tube placed.  Signed,  Dulcy Fanny. Earleen Newport, DO  [facility]  [facility].  PLAN: The gastrostomy tube should be low wall suction for 24 hours. The tube should not be used for the first 24 hours.  Routine care at the skin site.   Electronically Signed   By: Corrie Mckusick D.O.   On: 02/28/2015 16:48    Anti-infectives: Anti-infectives    Start     Dose/Rate Route Frequency Ordered Stop   02/28/15 0800  ceFAZolin (ANCEF) IVPB 2 g/50 mL premix    Comments:  Hang ON CALL to Radiology 7/13   2 g 100 mL/hr over 30 Minutes Intravenous To Radiology 02/27/15 1450 02/28/15 1621      Assessment/Plan: Supraglottic carcinoma, dysphagia, malnutrition, alcohol abuse s/p Procedure(s) with comments: TRACHEOSTOMY (N/A) - awake tracheostomy DIRECT LARYNGOSCOPY (N/A) - direct laryngoscopy with biopsy G-tube placed.  Will plan to start feeds at 4:30pm by IR recommendations.  Fever last night but normal temperature this morning.  Patient stable.  Need to proceed with discharge planning via social work.  Seems a skilled nursing facility may be needed given his unstable home situation.  LOS: 7 days    Marvin [name] 03/01/2015

## 2015-03-01 NOTE — Progress Notes (Signed)
Utilization review complete. Zurisadai Helminiak RN CCM Case Mgmt phone [phone] 

## 2015-03-02 ENCOUNTER — Telehealth: Payer: Self-pay | Admitting: Hematology and Oncology

## 2015-03-02 DIAGNOSIS — K083 Retained dental root: Secondary | ICD-10-CM

## 2015-03-02 DIAGNOSIS — K045 Chronic apical periodontitis: Secondary | ICD-10-CM

## 2015-03-02 LAB — GLUCOSE, CAPILLARY
GLUCOSE-CAPILLARY: 104 mg/dL — AB (ref 65–99)
Glucose-Capillary: 105 mg/dL — ABNORMAL HIGH (ref 65–99)
Glucose-Capillary: 107 mg/dL — ABNORMAL HIGH (ref 65–99)
Glucose-Capillary: 114 mg/dL — ABNORMAL HIGH (ref 65–99)
Glucose-Capillary: 123 mg/dL — ABNORMAL HIGH (ref 65–99)

## 2015-03-02 MED ORDER — JEVITY 1.2 CAL PO LIQD
ORAL | Status: AC
  Filled 2015-03-02: qty 237

## 2015-03-02 MED ORDER — JEVITY 1.2 CAL PO LIQD
1000.0000 mL | ORAL | Status: DC
  Administered 2015-03-02: 1000 mL
  Administered 2015-03-03: 18:00:00
  Administered 2015-03-04: 1000 mL
  Administered 2015-03-05: 70 mL/h
  Administered 2015-03-07: 1000 mL
  Filled 2015-03-02 (×2): qty 1000
  Filled 2015-03-02: qty 237
  Filled 2015-03-02 (×11): qty 1000
  Filled 2015-03-02: qty 237

## 2015-03-02 NOTE — Progress Notes (Signed)
Nutrition Follow-up  DOCUMENTATION CODES:   Severe malnutrition in context of chronic illness, Underweight  INTERVENTION:   -Continue Jevity 1.2 @ 20 ml/hr via G-tube and increase by 10 ml every 12 hours to goal rate of 70 ml/hr.  Tube feeding regimen provides 2016 kcal (100% of needs), 93 grams of protein, and 1356 ml of H2O.   -Recommend monitor K, Mg, and Phos daily x 3 days due to high risk of refeeding syndrome  NUTRITION DIAGNOSIS:   Increased nutrient needs related to cancer and cancer related treatments as evidenced by estimated needs.  Ongoing  GOAL:   Patient will meet greater than or equal to 90% of their needs  Progressing  MONITOR:   Diet advancement, Labs, Weight trends, TF tolerance, Skin, I & O's  REASON FOR ASSESSMENT:   Consult Diet education  ASSESSMENT:   39 year old male with over two months of throat pain that has worsened and is associated with dysphagia and muffled voice. He feels some difficulty breathing at times. He has smoked for a long time and used alcohol heavily. He has lost some weight. 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  Patient s/p procedure 7/8: AWAKE TRACHEOSTOMY DIRECT LARYNGOSCOPY WITH BIOPSY  Pt s/p G-tube placement on 03/01/15. TF started at 0330 per orders- Jevity 1.2 currently infusing @ 20 ml/hr via G-tube. This is providing 576 kcals, 27 grams protein, and 387 ml fluid daily (30% of estimated kcal needs and 34% of estimated kcal needs).   Reviewed SLP note from 03/01/15. They are recommending MBSS for swallow safety, as pt does desire to take PO's. Per SLP, primary source of nutrition will come from TF.   Dentistry following; pt to proceed with multiple tooth extractions on 03/05/15 in preparation for cancer treatments.   Diet Order:  Diet NPO time specified Except for: Sips with Meds  Skin:   Reviewed, no issues  Last BM:  02/26/15  Height:   Ht Readings from Last 1 Encounters:  02/22/15 6\' 1"  (1.854 m)    Weight:   Wt Readings from Last 1 Encounters:  03/02/15 132 lb 11.5 oz (60.2 kg)    Ideal Body Weight:  83.6 kg (kg)  Wt Readings from Last 10 Encounters:  03/02/15 132 lb 11.5 oz (60.2 kg)  02/14/15 141 lb (63.957 kg)  08/19/13 149 lb 9 oz (67.841 kg)  09/24/12 130 lb (58.968 kg)  04/16/12 136 lb (61.689 kg)    BMI:  Body mass index is 17.51 kg/(m^2).  Estimated Nutritional Needs:   Kcal:  1900-2100  Protein:  80-90 grams  Fluid:  2.2 L/day  EDUCATION NEEDS:   Education needs addressed  Mekia Dipinto A. Jimmye Norman, RD, LDN, CDE Pager: [phone] After hours Pager: [phone]

## 2015-03-02 NOTE — Progress Notes (Signed)
8 Days Post-Op  Subjective: Doing well.  No complaints.  Abdomen feels fine.  Feedings started.  Using Passy-Muir valve.  Objective: Vital signs in last 24 hours: Temp:  [98.9 F (37.2 C)-100 F (37.8 C)] 99.1 F (37.3 C) (07/15 0503) Pulse Rate:  [76-100] 82 (07/15 0736) Resp:  [16-20] 18 (07/15 0736) BP: (121-126)/(75-83) 126/83 mmHg (07/15 0503) SpO2:  [93 %-100 %] 98 % (07/15 0736) FiO2 (%):  [28 %] 28 % (07/15 0736) Weight:  [60.2 kg (132 lb 11.5 oz)] 60.2 kg (132 lb 11.5 oz) (07/15 0500) Last BM Date: 02/23/15  Intake/Output from previous day: 07/14 0701 - 07/15 0700 In: 1260 [P.O.:60; I.V.:1200] Out: 2275 [Urine:2125; Drains:150] Intake/Output this shift: Total I/O In: -  Out: 225 [Urine:225]  General appearance: alert, cooperative and no distress Neck: #6 cuffless Shiley in place, some drainage around trach tube, Passy-Muir valve in place with fairly good voice.  Lab Results:   Recent Labs  02/28/15 0333  WBC 5.2  HGB 11.1*  HCT 31.8*  PLT 181   BMET No results for input(s): NA, K, CL, CO2, GLUCOSE, BUN, CREATININE, CALCIUM in the last 72 hours. PT/INR  Recent Labs  02/28/15 0333  LABPROT 14.9  INR 1.15   ABG No results for input(s): PHART, HCO3 in the last 72 hours.  Invalid input(s): PCO2, PO2  Studies/Results: Dg Orthopantogram  03/01/2015   CLINICAL DATA:  Poor dentition.  EXAM: ORTHOPANTOGRAM/PANORAMIC  COMPARISON:  None.  FINDINGS: No fracture or dislocation is noted. All teeth appear to be present. No lytic destruction is noted.  IMPRESSION: No significant abnormality seen in the mandible.   Electronically Signed   By: Marijo Conception, M.D.   On: 03/01/2015 11:58   Ir Gastrostomy Tube Mod Sed  02/28/2015   CLINICAL DATA:  39 year old male with a history of head and neck carcinoma. He has been referred for percutaneous gastrostomy tube, pending therapy for piriform sinus carcinoma.  EXAM: PERCUTANEOUS GASTROSTOMY  FLUOROSCOPY TIME:  2 minutes,  0 seconds  MEDICATIONS AND MEDICAL HISTORY: Versed 2.0 mg, Fentanyl 100 mcg.  ANESTHESIA/SEDATION: Moderate sedation time: 30 minutes  CONTRAST:  15 cc enteric contrast  PROCEDURE: The procedure, risks, benefits, and alternatives were explained to the patient and the patient's family. Questions regarding the procedure were encouraged and answered. The patient understands and consents to the procedure.  Initial fluoroscopic scout images performed.  Before proceeding, a multipurpose catheter was advanced as an orogastric tube for insufflation of the stomach under fluoroscopy. Location was confirmed with fluoroscopy.  The stomach was insufflated.  Skin and subcutaneous tissues of the left upper quadrant were generously infiltrated with 1% lidocaine for local anesthesia. Subcutaneous tissues in the muscle air were also generously infiltrated with lidocaine for anesthesia.  33 gauge needle was then advanced under fluoroscopy into the lumen of the stomach. The location the tip of the needle was confirmed with infusion of contrast into the stomach lumen. A T-tack was deposited. Stiff Amplatz wire was advanced into the lumen of the stomach. A small incision was made with 11 blade scalpel.  Two additional T-tacks were deposited.  Serial dilation to 20 French diameter was performed over the Amplatz wire. A 20 French peel-away sheath was then placed. An 72 French balloon retention gastrostomy tube was then placed through the peel-away sheath. Once the peel-away sheath was removed, location of the gastrostomy tube was confirmed by infusion of a small amount of contrast. 9 cc of fluid was used to inflate the  gastrostomy tube.  Patient tolerated the procedure well and remained hemodynamically stable throughout.  No complications were encountered and no significant blood loss was encountered.  FINDINGS: The image demonstrates placement of a 56 French balloon retention percutaneous gastrostomy tube.  IMPRESSION: Status post  placement of percutaneous gastrostomy tube with 21 French balloon retention tube placed.  Signed,  Dulcy Fanny. Earleen Newport, DO  [facility]  [facility].  PLAN: The gastrostomy tube should be low wall suction for 24 hours. The tube should not be used for the first 24 hours.  Routine care at the skin site.   Electronically Signed   By: Corrie Mckusick D.O.   On: 02/28/2015 16:48    Anti-infectives: Anti-infectives    Start     Dose/Rate Route Frequency Ordered Stop   02/28/15 0800  ceFAZolin (ANCEF) IVPB 2 g/50 mL premix    Comments:  Hang ON CALL to Radiology 7/13   2 g 100 mL/hr over 30 Minutes Intravenous To Radiology 02/27/15 1450 02/28/15 1621      Assessment/Plan: Supraglottic carcinoma, dysphagia, malnutrition, alcohol dependence s/p Procedure(s) with comments: TRACHEOSTOMY (N/A) - awake tracheostomy DIRECT LARYNGOSCOPY (N/A) - direct laryngoscopy with biopsy Discussed his case with social worker who is trying to find SNF placement.  Dental extractions tentatively planned for Monday if he remains an inpatient at that time.  The PET scan will be done as an outpatient.  Continue current care.  Ready for discharge to SNF when a bed is available.  LOS: 8 days    Renelle Stegenga 03/02/2015

## 2015-03-02 NOTE — Progress Notes (Signed)
Speech Language Pathology Treatment: Nada Boozer Speaking valve;Dysphagia  Patient Details Name: RADEN BYINGTON MRN: [id number] DOB: Sep 15, 1975 Today's Date: 03/02/2015 Time: 1540-1550 SLP Time Calculation (min) (ACUTE ONLY): 10 min  Assessment / Plan / Recommendation Clinical Impression  Pt now using PMV all waking hours and tolerating well.  Required reminders re: daily cleaning and removal at night. Pt able to place/remove valve independently. Voice quality mildly hoarse, better volume.  Currently orders are written for small sips of water and ice chips; however, pt has been consuming large amounts of water despite cues to limit rate/bolus size.  Lungs diminished; 100 degree fever today.   Pt eager to eat/drink greater variety of POs to supplement enteral feeding. Received telephone orders from Dr. Redmond Baseman to proceed with repeat MBS.  D/w pt and RN.  Repeat study will at least allow better information re: safest diet consistencies.    HPI Other Pertinent Information: 39 yo male with h/o ETOH abuse, COPD, anxiety, GERD, and seizures (suspect related to ETOH withdrawal), who presents with c/o throat pain, dysphagia, and changes in vocal quality that have worsened over the last two months. 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 Per MD note, there is associated narrowing of the airway.   Pertinent Vitals Pain Assessment: No/denies pain  SLP Plan  MBS;Continue with current plan of care    Recommendations Diet recommendations:  (sips of water, ice chips) Liquids provided via: Straw;Cup Medication Administration: Via alternative means Compensations: Slow rate;Small sips/bites Postural Changes and/or Swallow Maneuvers: Seated upright 90 degrees      Patient may use Passy-Muir Speech Valve: During all waking hours (remove during sleep) PMSV Supervision: Intermittent       Oral Care Recommendations: Oral care QID;Other (Comment) Follow up Recommendations: Skilled  Nursing facility Plan: MBS;Continue with current plan of care    Daison Braxton L. Tivis Ringer, [place] CCC/SLP Pager [phone]      [name] 03/02/2015, 4:15 PM

## 2015-03-02 NOTE — Telephone Encounter (Signed)
NOTIFY PATIENT OF HOSP F/U APPT ON 07/25 @ 11:45 W/DR. [place]; CHEMO EDU 07/26 @ 9:30 [id number]

## 2015-03-02 NOTE — Progress Notes (Signed)
PRE-OPERATIVE NOTE:  03/02/2015   Marvin [name] [id number]  VITALS: BP 126/83 mmHg  Pulse 92  Temp(Src) 99.1 F (37.3 C) (Oral)  Resp 20  Ht 6\' 1"  (1.854 m)  Wt 132 lb 11.5 oz (60.2 kg)  BMI 17.51 kg/m2  SpO2 93%  Lab Results  Component Value Date   WBC 5.2 02/28/2015   HGB 11.1* 02/28/2015   HCT 31.8* 02/28/2015   MCV 95.8 02/28/2015   PLT 181 02/28/2015   BMET    Component Value Date/Time   NA 135 02/14/2015 1439   K 4.2 02/14/2015 1439   CL 100* 02/14/2015 1439   CO2 24 02/14/2015 1439   GLUCOSE 81 02/14/2015 1439   BUN <5* 02/14/2015 1439   CREATININE 0.65 02/22/2015 1731   CALCIUM 8.5* 02/14/2015 1439   GFRNONAA >60 02/22/2015 1731   GFRAA >60 02/22/2015 1731    Lab Results  Component Value Date   INR 1.15 02/28/2015   INR 1.06 02/14/2015   INR 1.0 02/19/2009   No results found for: PTT   Marvin [name]  is scheduled for multiple extractions with alveoloplasty and gross debridement of remaining dentition the operating room with general anesthesia through his trach site on Monday, 03/05/2015 at 12 noon.  Surgery is tentatively scheduled for Monday pending discharge or transfer to skilled nursing facility. Plan is to proceed with extraction of tooth numbers 1, 2, 8, 9, 15, 16, 17, and 32 with alveoloplasty followed by grossly remaining dentition.  The patient is aware that other teeth may be extracted if needed during further evaluation of the operating room.   SUBJECTIVE: The patient denies any acute medical or dental changes and agrees to proceed with treatment as planned.  EXAM: No sign of acute dental changes.  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  PLAN: Patient agrees to proceed with treatment as planned in the operating room on Monday, 03/05/2015 at 12 noon as previously discussed and accepts the risks, benefits, and complications of the proposed  treatment. Patient is aware of the risk for bleeding, bruising, swelling, infection, pain, nerve damage, soft tissue damage, damage to adjacent teeth, sinus involvement, root tip fracture, mandible fracture, and the risks of complications associated with the anesthesia. Patient also is aware of the potential for other complications not mentioned above.   Lenn Cal, DDS

## 2015-03-02 NOTE — Clinical Social Work Note (Signed)
CSW received phone call from patient's Marvin [name] who is a main contact for patient to discuss discharge planning for patient.  CSW explained to patient's Aunt that CSW is trying to find a SNF that can take patient with a trach.  CSW explained to patient's aunt that there are very few facilities that take patients without insurance and with a trach.  CSW explained to patient's Aunt that CSW is working on trying to find placement for patient and also informed her that it may not be in [place].  CSW will continue to follow patient's progress throughout discharge planning and will continue to find placement for patient  [name]. [place], MSW, [place] 03/02/2015 6:43 PM

## 2015-03-03 ENCOUNTER — Inpatient Hospital Stay (HOSPITAL_COMMUNITY): Payer: Medicaid Other

## 2015-03-03 LAB — GLUCOSE, CAPILLARY
GLUCOSE-CAPILLARY: 106 mg/dL — AB (ref 65–99)
Glucose-Capillary: 110 mg/dL — ABNORMAL HIGH (ref 65–99)
Glucose-Capillary: 111 mg/dL — ABNORMAL HIGH (ref 65–99)
Glucose-Capillary: 111 mg/dL — ABNORMAL HIGH (ref 65–99)
Glucose-Capillary: 117 mg/dL — ABNORMAL HIGH (ref 65–99)
Glucose-Capillary: 118 mg/dL — ABNORMAL HIGH (ref 65–99)

## 2015-03-03 MED ORDER — RESOURCE THICKENUP CLEAR PO POWD
ORAL | Status: DC | PRN
  Filled 2015-03-03: qty 125

## 2015-03-03 NOTE — Progress Notes (Signed)
Speech Language Pathology Treatment:    Patient Details Name: Marvin [name] MRN: [id number] DOB: 1975-09-15 Today's Date: 03/03/2015 Time: 0820-0900 SLP Time Calculation (min) (ACUTE ONLY): 40 min  Assessment / Plan / Recommendation Clinical Impression  MBS was completed with the PMSV in place. The patient presents with sensory/motor pharyngeal dysphagia characterized by pharyngeal residue in the vallecular and pyriform sinuses as well as mild pharyngeal wall stasis. The patient demonstrated silent penetration during the swallow across all liquid textures which he struggled to clear given therapist directed cough. Penetration was not observed given numerous puree boluses. Recommend a dypshagia 1 diet with pudding thick liquids. Aspiration precautions as follows: sit upright for all intake, 1 small bite at a time and the patient MUST wear PMSV with all intake. ST to follow for therapeutic diet tolerance and swallowing therapy during acute stay. The patient was given swallowing exercises to work in independently each day. Recommend follow up at next level as well.    HPI Other Pertinent Information: 39 yo male with h/o ETOH abuse, COPD, anxiety, GERD, and seizures (suspect related to ETOH withdrawal), who presents with c/o throat pain, dysphagia, and changes in vocal quality that have worsened over the last two months. 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 Per MD note, there is associated narrowing of the airway.      SLP Plan    ST follow up at least 3x/week   Recommendations Medication Administration: Via alternative means Compensations: Slow rate;Small sips/bites (PMSV in place with intake.  )  Dysphagia 1 diet with pudding thick liquids with PMSV in place for all intake.                Oral Care Recommendations: Oral care QID;Other (Comment)    GO     Lamar Sprinkles 03/03/2015, 9:34 AM  Shelly Flatten, [place], [facility]  SLP [phone]

## 2015-03-03 NOTE — Progress Notes (Signed)
ENT Progress Note: POD #9  s/p Procedure(s): TRACHEOSTOMY DIRECT LARYNGOSCOPY   Subjective: No complaints  Objective: Vital signs in last 24 hours: Temp:  [98.8 F (37.1 C)-101.3 F (38.5 C)] 98.8 F (37.1 C) (07/16 0444) Pulse Rate:  [74-99] 81 (07/16 0444) Resp:  [16-18] 17 (07/16 0444) BP: (120-144)/(77-88) 122/85 mmHg (07/16 0444) SpO2:  [94 %-97 %] 95 % (07/16 0444) FiO2 (%):  [28 %] 28 % (07/16 0349) Weight:  [62.6 kg (138 lb 0.1 oz)] 62.6 kg (138 lb 0.1 oz) (07/16 0444) Weight change: 2.4 kg (5 lb 4.7 oz) Last BM Date: 03/02/15  Intake/Output from previous day: 07/15 0701 - 07/16 0700 In: 3261.7 [I.V.:2300; NG/GT:571.7] Out: 2000 [Urine:2000] Intake/Output this shift:    Labs: No results for input(s): WBC, HGB, HCT, PLT in the last 72 hours. No results for input(s): NA, K, CL, CO2, GLUCOSE, BUN, CALCIUM in the last 72 hours.  Invalid input(s): CREATININR  Studies/Results: Dg Orthopantogram  03/01/2015   CLINICAL DATA:  Poor dentition.  EXAM: ORTHOPANTOGRAM/PANORAMIC  COMPARISON:  None.  FINDINGS: No fracture or dislocation is noted. All teeth appear to be present. No lytic destruction is noted.  IMPRESSION: No significant abnormality seen in the mandible.   Electronically Signed   By: Marijo Conception, M.D.   On: 03/01/2015 11:58   Dg Swallowing Func-speech Pathology  03/03/2015    Objective Swallowing Evaluation:    Patient Details  Name: Marvin [name] MRN: [id number] Date of Birth: 09-15-1975  Today's Date: 03/03/2015 Time: SLP Start Time (ACUTE ONLY): 0820-SLP Stop Time (ACUTE ONLY): 0900 SLP Time Calculation (min) (ACUTE ONLY): 40 min  Past Medical History:  Past Medical History  Diagnosis Date  . ETOH abuse   . Alcohol dependence 04/17/2012  . COPD (chronic obstructive pulmonary disease)   . Shortness of breath dyspnea     occ  . Anxiety   . GERD (gastroesophageal reflux disease)   . Seizures     one episode 09/2012, suspected related to ETOH withdrawal   Past  Surgical History:  Past Surgical History  Procedure Laterality Date  . Knee surgery Left     kneecap -screws  . Appendectomy    . Tracheostomy tube placement N/A 02/22/2015    Procedure: TRACHEOSTOMY;  Surgeon: [name], MD;  Location: [facility];   Service: ENT;  Laterality: N/A;  awake tracheostomy  . Direct laryngoscopy N/A 02/22/2015    Procedure: DIRECT LARYNGOSCOPY;  Surgeon: [name], MD;  Location:  [facility] OR;  Service: ENT;  Laterality: N/A;  direct laryngoscopy with biopsy   HPI:  Other Pertinent Information: 39 yo male with h/o ETOH abuse, COPD,  anxiety, GERD, and seizures (suspect related to ETOH withdrawal), who  presents with c/o throat pain, dysphagia, and changes in vocal quality  that have worsened over the last two months. 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 Per MD note, there is associated  narrowing of the airway.  No Data Recorded  Assessment / Plan / Recommendation CHL IP CLINICAL IMPRESSIONS 03/03/2015  Therapy Diagnosis Moderate oral phase dysphagia  Clinical Impression MBS was completed with the PMSV in place.  The patient  presents with sensory/motor pharyngeal dysphagia characterized by  pharyngeal residue in the vallecular and pyriform sinuses as well as mild  pharyngeal wall stasis.  The patient demonstrated silent penetration  during the swallow across all liquid textures which he struggled to clear  given therapist directed cough.  Penetration was not observed  given  numerous puree boluses.  Recommend a dypshagia 1 diet with pudding thick  liquids.  Aspiration precautions as follows:  sit upright for all intake,  1 small bite at a time and the patient MUST wear PMSV with all intake.  ST  to follow for therapeutic diet tolerance and swallowing therapy during  acute stay.  The patient was given swallowing exercises to work in  independently each day.  Recommend follow up at next level as well.       CHL IP TREATMENT RECOMMENDATION 03/03/2015  Treatment  Recommendations Therapy as outlined in treatment plan below     CHL IP DIET RECOMMENDATION 03/03/2015  SLP Diet Recommendations Dysphagia 1 (Puree)  Liquid Administration via (None)  Medication Administration Via alternative means  Compensations Slow rate;Small sips/bites  Postural Changes and/or Swallow Maneuvers (None)     CHL IP OTHER RECOMMENDATIONS 03/03/2015  Recommended Consults (None)  Oral Care Recommendations Oral care QID;Other (Comment)  Other Recommendations Place PMSV during PO intake;Prohibited food (jello,  ice cream, thin soups);Remove water pitcher;Have oral suction  available;Order thickener from pharmacy     CHL IP FOLLOW UP RECOMMENDATIONS 03/02/2015  Follow up Recommendations Skilled Nursing facility     [facility] IP FREQUENCY AND DURATION 03/03/2015  Speech Therapy Frequency (ACUTE ONLY) min 3x week  Treatment Duration 2 weeks     Pertinent Vitals/Pain The patient was wearing his PMSV when he arrived in  radiology.  O2 levels fluctuated dropping as low as 86.  The patient's  nurse was called and asked to come to radiology.  The patient tolerated  the procedure well.  At end of procedure it was noted that the O2 monitor  tape was not applied correctly.  When that was corrected the patient's O2  levels jumped to 96-97.        Marvin [name] 03/03/2015, 9:24 AM  Marvin Flatten, MA, CCC-SLP Acute Rehab SLP [phone]       PHYSICAL EXAM: Trach in place with PMV Airway stable    Assessment/Plan: Stable, cont current care. Plan NH transfer when ready Pt to begin [place], Marvin [name] 03/03/2015, 9:57 AM

## 2015-03-04 LAB — GLUCOSE, CAPILLARY
GLUCOSE-CAPILLARY: 103 mg/dL — AB (ref 65–99)
GLUCOSE-CAPILLARY: 109 mg/dL — AB (ref 65–99)
GLUCOSE-CAPILLARY: 112 mg/dL — AB (ref 65–99)
Glucose-Capillary: 91 mg/dL (ref 65–99)
Glucose-Capillary: 111 mg/dL — ABNORMAL HIGH (ref 65–99)
Glucose-Capillary: 115 mg/dL — ABNORMAL HIGH (ref 65–99)

## 2015-03-04 MED ORDER — CEFAZOLIN SODIUM-DEXTROSE 2-3 GM-% IV SOLR
2.0000 g | INTRAVENOUS | Status: AC
  Administered 2015-03-05: 2 g via INTRAVENOUS

## 2015-03-04 MED ORDER — ENSURE ENLIVE PO LIQD
237.0000 mL | Freq: Two times a day (BID) | ORAL | Status: DC
  Administered 2015-03-06 – 2015-03-08 (×3): 237 mL via ORAL

## 2015-03-04 NOTE — Progress Notes (Signed)
ENT Progress Note: POD #10 s/p Procedure(s): TRACHEOSTOMY DIRECT LARYNGOSCOPY   Subjective: Stable  Objective: Vital signs in last 24 hours: Temp:  [98.7 F (37.1 C)-100.2 F (37.9 C)] 100.2 F (37.9 C) (07/17 0607) Pulse Rate:  [88-118] 102 (07/17 0607) Resp:  [16-22] 22 (07/17 0607) BP: (120-136)/(82-88) 136/88 mmHg (07/17 0607) SpO2:  [92 %-96 %] 95 % (07/17 0607) Weight change:  Last BM Date: 03/02/15  Intake/Output from previous day: 07/16 0701 - 07/17 0700 In: 3045 [I.V.:2400; NG/GT:555] Out: 725 [Urine:725] Intake/Output this shift: Total I/O In: -  Out: 200 [Urine:200]  Labs: No results for input(s): WBC, HGB, HCT, PLT in the last 72 hours. No results for input(s): NA, K, CL, CO2, GLUCOSE, BUN, CALCIUM in the last 72 hours.  Invalid input(s): CREATININR  Studies/Results: Dg Swallowing Func-speech Pathology  03/03/2015    Objective Swallowing Evaluation:    Patient Details  Name: JUNIE ENGRAM MRN: [id number] Date of Birth: June 03, 1976  Today's Date: 03/03/2015 Time: SLP Start Time (ACUTE ONLY): 0820-SLP Stop Time (ACUTE ONLY): 0900 SLP Time Calculation (min) (ACUTE ONLY): 40 min  Past Medical History:  Past Medical History  Diagnosis Date  . ETOH abuse   . Alcohol dependence 04/17/2012  . COPD (chronic obstructive pulmonary disease)   . Shortness of breath dyspnea     occ  . Anxiety   . GERD (gastroesophageal reflux disease)   . Seizures     one episode 09/2012, suspected related to ETOH withdrawal   Past Surgical History:  Past Surgical History  Procedure Laterality Date  . Knee surgery Left     kneecap -screws  . Appendectomy    . Tracheostomy tube placement N/A 02/22/2015    Procedure: TRACHEOSTOMY;  Surgeon: [name], MD;  Location: [facility];   Service: ENT;  Laterality: N/A;  awake tracheostomy  . Direct laryngoscopy N/A 02/22/2015    Procedure: DIRECT LARYNGOSCOPY;  Surgeon: [name], MD;  Location:  [facility] OR;  Service: ENT;  Laterality: N/A;  direct laryngoscopy  with biopsy   HPI:  Other Pertinent Information: 39 yo male with h/o ETOH abuse, COPD,  anxiety, GERD, and seizures (suspect related to ETOH withdrawal), who  presents with c/o throat pain, dysphagia, and changes in vocal quality  that have worsened over the last two months. 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 Per MD note, there is associated  narrowing of the airway.  No Data Recorded  Assessment / Plan / Recommendation CHL IP CLINICAL IMPRESSIONS 03/03/2015  Therapy Diagnosis Moderate oral phase dysphagia  Clinical Impression MBS was completed with the PMSV in place.  The patient  presents with sensory/motor pharyngeal dysphagia characterized by  pharyngeal residue in the vallecular and pyriform sinuses as well as mild  pharyngeal wall stasis.  The patient demonstrated silent penetration  during the swallow across all liquid textures which he struggled to clear  given therapist directed cough.  Penetration was not observed given  numerous puree boluses.  Recommend a dypshagia 1 diet with pudding thick  liquids.  Aspiration precautions as follows:  sit upright for all intake,  1 small bite at a time and the patient MUST wear PMSV with all intake.  ST  to follow for therapeutic diet tolerance and swallowing therapy during  acute stay.  The patient was given swallowing exercises to work in  independently each day.  Recommend follow up at next level as well.       CHL IP TREATMENT  RECOMMENDATION 03/03/2015  Treatment Recommendations Therapy as outlined in treatment plan below     CHL IP DIET RECOMMENDATION 03/03/2015  SLP Diet Recommendations Dysphagia 1 (Puree)  Liquid Administration via (None)  Medication Administration Via alternative means  Compensations Slow rate;Small sips/bites  Postural Changes and/or Swallow Maneuvers (None)     CHL IP OTHER RECOMMENDATIONS 03/03/2015  Recommended Consults (None)  Oral Care Recommendations Oral care QID;Other (Comment)  Other Recommendations Place  PMSV during PO intake;Prohibited food (jello,  ice cream, thin soups);Remove water pitcher;Have oral suction  available;Order thickener from pharmacy     CHL IP FOLLOW UP RECOMMENDATIONS 03/02/2015  Follow up Recommendations Skilled Nursing facility     [facility] IP FREQUENCY AND DURATION 03/03/2015  Speech Therapy Frequency (ACUTE ONLY) min 3x week  Treatment Duration 2 weeks     Pertinent Vitals/Pain The patient was wearing his PMSV when he arrived in  radiology.  O2 levels fluctuated dropping as low as 86.  The patient's  nurse was called and asked to come to radiology.  The patient tolerated  the procedure well.  At end of procedure it was noted that the O2 monitor  tape was not applied correctly.  When that was corrected the patient's O2  levels jumped to 96-97.        Lamar Sprinkles 03/03/2015, 9:24 AM  Shelly Flatten, MA, CCC-SLP Acute Rehab SLP [phone]       PHYSICAL EXAM: Trach in place Mod secretions and wet cough  Assessment/Plan: Pt febrile this am, plan oob and ambulate with assistance CBC in am    Roshan Salamon 03/04/2015, 9:31 AM

## 2015-03-05 ENCOUNTER — Telehealth: Payer: Self-pay | Admitting: *Deleted

## 2015-03-05 ENCOUNTER — Inpatient Hospital Stay (HOSPITAL_COMMUNITY): Payer: Medicaid Other | Admitting: Anesthesiology

## 2015-03-05 ENCOUNTER — Encounter (HOSPITAL_COMMUNITY): Payer: Self-pay | Admitting: Anesthesiology

## 2015-03-05 ENCOUNTER — Encounter (HOSPITAL_COMMUNITY)
Admission: RE | Disposition: A | Discharge status: Skilled Nursing Facility | Payer: Self-pay | Source: Ambulatory Visit | Attending: Otolaryngology

## 2015-03-05 DIAGNOSIS — K0530 Chronic periodontitis, unspecified: Secondary | ICD-10-CM | POA: Diagnosis present

## 2015-03-05 DIAGNOSIS — K036 Deposits [accretions] on teeth: Secondary | ICD-10-CM

## 2015-03-05 DIAGNOSIS — K029 Dental caries, unspecified: Secondary | ICD-10-CM | POA: Diagnosis present

## 2015-03-05 DIAGNOSIS — K083 Retained dental root: Secondary | ICD-10-CM | POA: Diagnosis present

## 2015-03-05 DIAGNOSIS — K045 Chronic apical periodontitis: Secondary | ICD-10-CM | POA: Diagnosis present

## 2015-03-05 HISTORY — PX: MULTIPLE EXTRACTIONS WITH ALVEOLOPLASTY: SHX5342

## 2015-03-05 LAB — GLUCOSE, CAPILLARY
GLUCOSE-CAPILLARY: 103 mg/dL — AB (ref 65–99)
GLUCOSE-CAPILLARY: 137 mg/dL — AB (ref 65–99)
Glucose-Capillary: 105 mg/dL — ABNORMAL HIGH (ref 65–99)
Glucose-Capillary: 102 mg/dL — ABNORMAL HIGH (ref 65–99)

## 2015-03-05 LAB — CBC
HCT: 30.8 % — ABNORMAL LOW (ref 39.0–52.0)
Hemoglobin: 10.4 g/dL — ABNORMAL LOW (ref 13.0–17.0)
MCH: 32.5 pg (ref 26.0–34.0)
MCHC: 33.8 g/dL (ref 30.0–36.0)
MCV: 96.3 fL (ref 78.0–100.0)
PLATELETS: 496 10*3/uL — AB (ref 150–400)
RBC: 3.2 MIL/uL — ABNORMAL LOW (ref 4.22–5.81)
RDW: 13 % (ref 11.5–15.5)
WBC: 10.7 10*3/uL — ABNORMAL HIGH (ref 4.0–10.5)

## 2015-03-05 SURGERY — MULTIPLE EXTRACTION WITH ALVEOLOPLASTY
Anesthesia: General | Site: Mouth

## 2015-03-05 MED ORDER — LIDOCAINE-EPINEPHRINE 2 %-1:100000 IJ SOLN
INTRAMUSCULAR | Status: DC | PRN
  Administered 2015-03-05: 1.7 mL

## 2015-03-05 MED ORDER — NEOSTIGMINE METHYLSULFATE 10 MG/10ML IV SOLN
INTRAVENOUS | Status: DC | PRN
  Administered 2015-03-05: 3 mg via INTRAVENOUS

## 2015-03-05 MED ORDER — PROPOFOL 10 MG/ML IV BOLUS
INTRAVENOUS | Status: DC | PRN
  Administered 2015-03-05: 100 mg via INTRAVENOUS

## 2015-03-05 MED ORDER — HEMOSTATIC AGENTS (NO CHARGE) OPTIME
TOPICAL | Status: DC | PRN
  Administered 2015-03-05: 1 via TOPICAL

## 2015-03-05 MED ORDER — FENTANYL CITRATE (PF) 250 MCG/5ML IJ SOLN
INTRAMUSCULAR | Status: AC
  Filled 2015-03-05: qty 5

## 2015-03-05 MED ORDER — LIDOCAINE-EPINEPHRINE 2 %-1:100000 IJ SOLN
INTRAMUSCULAR | Status: AC
  Filled 2015-03-05: qty 10.2

## 2015-03-05 MED ORDER — BUPIVACAINE-EPINEPHRINE 0.5% -1:200000 IJ SOLN
INTRAMUSCULAR | Status: DC | PRN
  Administered 2015-03-05: 3.6 mL

## 2015-03-05 MED ORDER — GLYCOPYRROLATE 0.2 MG/ML IJ SOLN
INTRAMUSCULAR | Status: DC | PRN
  Administered 2015-03-05: 0.4 mg via INTRAVENOUS

## 2015-03-05 MED ORDER — BUPIVACAINE-EPINEPHRINE (PF) 0.5% -1:200000 IJ SOLN
INTRAMUSCULAR | Status: AC
  Filled 2015-03-05: qty 3.6

## 2015-03-05 MED ORDER — OXYMETAZOLINE HCL 0.05 % NA SOLN
NASAL | Status: AC
  Filled 2015-03-05: qty 15

## 2015-03-05 MED ORDER — MIDAZOLAM HCL 2 MG/2ML IJ SOLN
INTRAMUSCULAR | Status: AC
Start: 2015-03-05 — End: 2015-03-05
  Filled 2015-03-05: qty 2

## 2015-03-05 MED ORDER — LACTATED RINGERS IV SOLN: INTRAVENOUS | Status: DC

## 2015-03-05 MED ORDER — GLYCOPYRROLATE 0.2 MG/ML IJ SOLN
INTRAMUSCULAR | Status: AC
  Filled 2015-03-05: qty 2

## 2015-03-05 MED ORDER — ROCURONIUM BROMIDE 100 MG/10ML IV SOLN
INTRAVENOUS | Status: DC | PRN
  Administered 2015-03-05: 30 mg via INTRAVENOUS
  Administered 2015-03-05: 10 mg via INTRAVENOUS

## 2015-03-05 MED ORDER — PHENYLEPHRINE HCL 10 MG/ML IJ SOLN
10.0000 mg | INTRAVENOUS | Status: DC | PRN
  Administered 2015-03-05: 10 ug/min via INTRAVENOUS

## 2015-03-05 MED ORDER — 0.9 % SODIUM CHLORIDE (POUR BTL) OPTIME
TOPICAL | Status: DC | PRN
Start: 2015-03-05 — End: 2015-03-05
  Administered 2015-03-05: 1000 mL

## 2015-03-05 MED ORDER — OXYCODONE HCL 5 MG/5ML PO SOLN
5.0000 mg | ORAL | Status: DC | PRN
Start: 2015-03-05 — End: 2015-03-08

## 2015-03-05 MED ORDER — HYDROMORPHONE HCL 1 MG/ML IJ SOLN
INTRAMUSCULAR | Status: AC
  Filled 2015-03-05: qty 1

## 2015-03-05 MED ORDER — SUCCINYLCHOLINE CHLORIDE 20 MG/ML IJ SOLN
INTRAMUSCULAR | Status: AC
  Filled 2015-03-05: qty 1

## 2015-03-05 MED ORDER — MIDAZOLAM HCL 5 MG/5ML IJ SOLN
INTRAMUSCULAR | Status: DC | PRN
  Administered 2015-03-05: 2 mg via INTRAVENOUS

## 2015-03-05 MED ORDER — LIDOCAINE HCL 4 % MT SOLN
OROMUCOSAL | Status: DC | PRN
  Administered 2015-03-05: 4 mL via TOPICAL

## 2015-03-05 MED ORDER — LACTATED RINGERS IV SOLN
INTRAVENOUS | Status: DC
  Administered 2015-03-05: 09:00:00 via INTRAVENOUS

## 2015-03-05 MED ORDER — ONDANSETRON HCL 4 MG/2ML IJ SOLN: 4.0000 mg | Freq: Once | INTRAMUSCULAR | Status: DC | PRN

## 2015-03-05 MED ORDER — LACTATED RINGERS IV SOLN
INTRAVENOUS | Status: DC | PRN
  Administered 2015-03-05 (×2): via INTRAVENOUS

## 2015-03-05 MED ORDER — NEOSTIGMINE METHYLSULFATE 10 MG/10ML IV SOLN
INTRAVENOUS | Status: AC
  Filled 2015-03-05: qty 1

## 2015-03-05 MED ORDER — FENTANYL CITRATE (PF) 100 MCG/2ML IJ SOLN
INTRAMUSCULAR | Status: DC | PRN
  Administered 2015-03-05: 100 ug via INTRAVENOUS
  Administered 2015-03-05 (×2): 50 ug via INTRAVENOUS

## 2015-03-05 MED ORDER — PROPOFOL 10 MG/ML IV BOLUS
INTRAVENOUS | Status: AC
  Filled 2015-03-05: qty 20

## 2015-03-05 MED ORDER — LIDOCAINE HCL (CARDIAC) 20 MG/ML IV SOLN
INTRAVENOUS | Status: AC
  Filled 2015-03-05: qty 5

## 2015-03-05 MED ORDER — HYDROMORPHONE HCL 1 MG/ML IJ SOLN
0.2500 mg | INTRAMUSCULAR | Status: DC | PRN
  Administered 2015-03-05 (×2): 0.5 mg via INTRAVENOUS

## 2015-03-05 MED ORDER — ONDANSETRON HCL 4 MG/2ML IJ SOLN
INTRAMUSCULAR | Status: AC
  Filled 2015-03-05: qty 2

## 2015-03-05 MED ORDER — LIDOCAINE HCL (CARDIAC) 20 MG/ML IV SOLN
INTRAVENOUS | Status: DC | PRN
  Administered 2015-03-05: 40 mg via INTRAVENOUS

## 2015-03-05 MED ORDER — ONDANSETRON HCL 4 MG/2ML IJ SOLN
INTRAMUSCULAR | Status: DC | PRN
  Administered 2015-03-05: 4 mg via INTRAVENOUS

## 2015-03-05 MED ORDER — ROCURONIUM BROMIDE 50 MG/5ML IV SOLN
INTRAVENOUS | Status: AC
  Filled 2015-03-05: qty 1

## 2015-03-05 MED ORDER — ACETAMINOPHEN 160 MG/5ML PO SOLN
325.0000 mg | ORAL | Status: DC | PRN
  Administered 2015-03-05: 650 mg via ORAL
  Filled 2015-03-05: qty 20.3

## 2015-03-05 SURGICAL SUPPLY — 33 items
ALCOHOL 70% 16 OZ (MISCELLANEOUS) ×3 IMPLANT
ATTRACTOMAT 16X20 MAGNETIC DRP (DRAPES) ×3 IMPLANT
BLADE SURG 15 STRL LF DISP TIS (BLADE) ×2 IMPLANT
BLADE SURG 15 STRL SS (BLADE) ×4
COVER SURGICAL LIGHT HANDLE (MISCELLANEOUS) ×3 IMPLANT
GAUZE PACKING FOLDED 2  STR (GAUZE/BANDAGES/DRESSINGS) ×2
GAUZE PACKING FOLDED 2 STR (GAUZE/BANDAGES/DRESSINGS) ×1 IMPLANT
GAUZE SPONGE 4X4 16PLY XRAY LF (GAUZE/BANDAGES/DRESSINGS) ×3 IMPLANT
GLOVE BIOGEL PI IND STRL 6 (GLOVE) ×1 IMPLANT
GLOVE BIOGEL PI INDICATOR 6 (GLOVE) ×2
GLOVE SURG ORTHO 8.0 STRL STRW (GLOVE) ×3 IMPLANT
GLOVE SURG SS PI 6.0 STRL IVOR (GLOVE) ×3 IMPLANT
GOWN STRL REUS W/ TWL LRG LVL3 (GOWN DISPOSABLE) ×2 IMPLANT
GOWN STRL REUS W/TWL 2XL LVL3 (GOWN DISPOSABLE) ×3 IMPLANT
GOWN STRL REUS W/TWL LRG LVL3 (GOWN DISPOSABLE) ×4
HEMOSTAT SURGICEL 2X14 (HEMOSTASIS) ×3 IMPLANT
KIT BASIN OR (CUSTOM PROCEDURE TRAY) ×3 IMPLANT
KIT ROOM TURNOVER OR (KITS) ×3 IMPLANT
MANIFOLD NEPTUNE WASTE (CANNULA) ×3 IMPLANT
NEEDLE BLUNT 16X1.5 OR ONLY (NEEDLE) ×3 IMPLANT
NS IRRIG 1000ML POUR BTL (IV SOLUTION) ×3 IMPLANT
PACK EENT II TURBAN DRAPE (CUSTOM PROCEDURE TRAY) ×3 IMPLANT
PAD ARMBOARD 7.5X6 YLW CONV (MISCELLANEOUS) ×3 IMPLANT
SPONGE SURGIFOAM ABS GEL 100 (HEMOSTASIS) IMPLANT
SPONGE SURGIFOAM ABS GEL 12-7 (HEMOSTASIS) IMPLANT
SPONGE SURGIFOAM ABS GEL SZ50 (HEMOSTASIS) IMPLANT
SUCTION FRAZIER TIP 10 FR DISP (SUCTIONS) ×3 IMPLANT
SUT CHROMIC 3 0 PS 2 (SUTURE) ×12 IMPLANT
SYR 50ML SLIP (SYRINGE) ×3 IMPLANT
TOWEL OR 17X26 10 PK STRL BLUE (TOWEL DISPOSABLE) ×3 IMPLANT
TUBE CONNECTING 12'X1/4 (SUCTIONS) ×1
TUBE CONNECTING 12X1/4 (SUCTIONS) ×2 IMPLANT
YANKAUER SUCT BULB TIP NO VENT (SUCTIONS) ×3 IMPLANT

## 2015-03-05 NOTE — Discharge Instructions (Signed)

## 2015-03-05 NOTE — Op Note (Signed)
OPERATIVE REPORT  Patient:            Marvin [name] Date of Birth:  1975/10/29 MRN:                [id number]   DATE OF PROCEDURE:  03/05/2015  PREOPERATIVE DIAGNOSES: 1. Cancer of the supraglottic larynx 2. Pre-chemoradiation therapy dental protocol 3. Chronic periodontitis 4. Chronic apical periodontitis 5. Multiple retained root segments 6. Dental caries  POSTOPERATIVE DIAGNOSES: 1. Cancer of the supraglottic larynx 2. Pre-chemoradiation therapy dental protocol 3. Chronic periodontitis 4. Chronic apical periodontitis 5. Multiple retained root segments 6. Dental caries 7. Mandibular Right Lingual Lateral Exostoses  OPERATIONS: 1. Multiple extraction of tooth numbers 1, 2, 8, 9, 15, 16, 17, 18, 31, and 32 2. 4 quadrants of alveoloplasty 3. Mandibular Right lingual lateral exostoses reductions area 31-32 4. Gross debridement of remaining dentition   SURGEON: Lenn Cal, DDS  ASSISTANT: Camie Patience, (dental assistant)  ANESTHESIA: General anesthesia through tracheostomy site   MEDICATIONS: 1. Ancef 2 g IV prior to invasive dental procedures. 2. Local anesthesia with a total utilization of 5 carpules each containing 34 mg of lidocaine with 0.017 mg of epinephrine as well as 2 carpules each containing 9 mg of bupivacaine with 0.009 mg of epinephrine.  SPECIMENS: There were 10 teeth that were discarded.  DRAINS: None  CULTURES: None  COMPLICATIONS: None   ESTIMATED BLOOD LOSS: 50 mLs.  INTRAVENOUS FLUIDS: 1400 mLs of Lactated ringers solution.  INDICATIONS: The patient was recently diagnosed with cancer of the supraglottic larynx.  A medically necessary dental consultation was then requested to evaluate poor dentition.  The patient was examined and treatment planned for multiple extractions with alveoloplasty, pre-prosthetic surgery as indicated, and gross debridement of remaining dentition in the operating room with general anesthesia.  This  treatment plan was formulated to decrease the risks and complications associated with dental infection from affecting the patient's systemic health and Port-A-Cath placement, and to prevent future complications such as osteoradionecrosis.  OPERATIVE FINDINGS: Patient was examined operating room number 3.  The teeth were identified for extraction. It was determined that tooth numbers 1, 2, 8, 9, 15, 16, 17, 18, 31, and 32 would be extracted at this time. However, patient may require additional extractions in the future with further pre-prosthetic surgery due to his extensive periodontal disease.  The patient was noted be affected by chronic periodontitis, chronic apical periodontitis, multiple retained root segments, dental caries, accretions, and tooth mobility. During the pre-prosthetic surgery evaluation, it was determined that the patient had mandibular left lingual exostoses in the area 31 and 32 that would need to be reduced at the time of surgery.   DESCRIPTION OF PROCEDURE: Patient was brought to the main operating room number 3. Patient was then placed in the supine position on the operating table. General anesthesia was then induced per the anesthesia team through the tracheostomy site. The patient was then prepped and draped in the usual manner for dental medicine procedure. A timeout was performed. The patient was identified and procedures were verified. A throat pack was placed at this time. The oral cavity was then thoroughly examined with the findings noted above. The patient was then ready for dental medicine procedure as follows:  Local anesthesia was then administered sequentially with a total utilization of 5 carpules each containing 34 mg of lidocaine with 0.017 mg of epinephrine as well as 2 carpules  each containing 9 mg bupivacaine with 0.009 mg of epinephrine.  The  Maxillary left and right quadrants first approached. Anesthesia was then delivered utilizing infiltration with  lidocaine with epinephrine. A #15 blade incision was then made from the maxillary right tuberosity and extended to the mesial #3.  A  surgical flap was then carefully reflected. Tooth numbers 1 and 2 were then subluxated with a series of straight elevators. Tooth numbers 1 and 2 were then removed with a 53 R forceps without complications. Alveoloplasty was then performed utilizing a rongeur and bone file. The tissues were approximated and trimmed appropriately. Surgical site was then irrigated with copious amounts of sterile saline. A piece of Surgifoam was then placed in the extraction sockets. The surgical site was then closed from the maxillary right tuberosity and extended to the distal of #3 utilizing 3-0 chromic gut suture in a continuous interrupted suture technique 1.  The maxillary anterior segment was then approached. A 15 blade incision was then made from the mesial numbers 7 and extended to the mesial #10. A surgical flap was then carefully reflected. The retained roots in the area of 8 and 9 were then removed with a rongeur without complications. Alveoloplasty was then performed utilizing a rongeur and bone file. The surgical site was irrigated with copious bowel sterile saline. The surgical site was then closed from the mesial of #10 and extended the mesial numbers 7 utilizing 3-0 chromic gut suture in a continuous interrupted suture technique 1.  The maxillary left quadrant was then approached. A 15 blade incision was then made from the maxillary left tuberosity and extended to the mesial of #14. A surgical flap was then carefully reflected. The maxillary left molars 16 and 15 were then subluxated with a series straight elevators. These were then removed with a 53L forceps without complications. Alveoloplasty was then performed utilizing a rongeur and bone file. The tissues were approximated and trimmed appropriately. Surgical site was then irrigated with copious amounts sterile saline. A piece  of Surgifoam was then placed in the extraction sockets. Surgical site was then closed from the maxillary left tuberosity and extended to the distal of #14 utilizing 3-0 chromic gut suture in a continuous interrupted suture technique 1.  At this point time, the mandibular quadrants were approached. The patient was given bilateral inferior alveolar nerve blocks and long buccal nerve blocks utilizing the bupivacaine with epinephrine. Further infiltration was then achieved utilizing the lidocaine with epinephrine. A 15 blade incision was then made from the distal of number 17 and extended the mesial #19.  A flap was carefully reflected. Tooth numbers 17 and 18 were then subluxated with a series straight elevators. Tooth #17 and 18 were then removed with a 23 forceps without complications. Alveoloplasty was then performed utilizing a rongeur and bone file. The tissues were approximated and trimmed appropriately. A piece of Surgifoam was then placed in the mandibular left extraction sites. The mandibular left surgical site was then closed from the distal of #17 and extended to the distal of #19 utilizing 3-0 chromic gut suture in a continuous interrupted suture technique 1.   At this point time the mandibular right surgical site was approached. A 15 blade incision was made from the distal of #32 and extended the mesial #30. A surgical flap was then carefully reflected numbers 31 and 32 were subluxated with a series straight elevators. Tooth numbers 31 and 32 were then removed with a 23 forceps without complications. Alveoloplasty was then performed utilizing a rongeur and bone file. At this point time mandibualr left lingual lateral  exostoses were noted in the area 31 and 32. These were then reduced utilizing a surgical handpiece and bur and copious amounts of sterile water. Further alveoloplasty is a performed utilizing a rongeur and bone file. The surgical sites were then irrigated with copious amounts of sterile  saline. A piece of Surgifoam was then placed in the extraction sockets appropriately. The surgical site was then closed from the distal of #32 and extended to the distal of #30 utilizing 3-0 chromic gut suture in a continuous interrupted suture technique 1.  At this point time the remaining dentition was approached. A sonic scaler was used to remove significant accretions. A series of hand curettes were then utilized to further remove accretions. A sonic scaler was then again used to further refine removal of accretions. The patient will need additional periodontal therapy in the future to determine the overall prognosis for the remaining dentition and may require extraction of remaining teeth as indicated after the anticipated chemoradiation therapy.  At this point time, the entire mouth was irrigated with copious amounts of sterile saline. The patient was examined for complications, seeing none, the dental medicine procedure was deemed to be complete. The throat pack was removed at this time. A series of 4 x 4 gauze were placed in the mouth to aid hemostasis. The patient was then handed over to the anesthesia team for final disposition. After an appropriate amount of time, the patient had his tracheostomy site returned to preoperative condition. The patient was then taken to the postanesthsia care unit in good condition. All counts were correct for the dental medicine procedure.  The patient will be followed by the medical team and then most likely discharged in the morning at the discretion of the medical team. The patient is aware of the currently scheduled PET scan for this coming Friday as an outpatient.   Lenn Cal, DDS.

## 2015-03-05 NOTE — Telephone Encounter (Signed)
Called patient to inform of scan, nutrition appt. And appt. With [employer], lvm for a return call

## 2015-03-05 NOTE — Clinical Social Work Note (Signed)
CSW continuing to follow patient throughout discharge planning.  CSW still trying to find SNF that can take patient who is self-pay.  [name]. [place], MSW, [place] 03/05/2015 5:25 PM

## 2015-03-05 NOTE — Progress Notes (Signed)
PRE-OPERATIVE NOTE:  03/05/2015   Marvin [name] [id number]  VITALS: BP 122/71 mmHg  Pulse 102  Temp(Src) 99.6 F (37.6 C) (Oral)  Resp 18  Ht 6\' 1"  (1.854 m)  Wt 141 lb 8.6 oz (64.2 kg)  BMI 18.68 kg/m2  SpO2 91%  Lab Results  Component Value Date   WBC 10.7* 03/05/2015   HGB 10.4* 03/05/2015   HCT 30.8* 03/05/2015   MCV 96.3 03/05/2015   PLT 496* 03/05/2015   BMET    Component Value Date/Time   NA 135 02/14/2015 1439   K 4.2 02/14/2015 1439   CL 100* 02/14/2015 1439   CO2 24 02/14/2015 1439   GLUCOSE 81 02/14/2015 1439   BUN <5* 02/14/2015 1439   CREATININE 0.65 02/22/2015 1731   CALCIUM 8.5* 02/14/2015 1439   GFRNONAA >60 02/22/2015 1731   GFRAA >60 02/22/2015 1731    Lab Results  Component Value Date   INR 1.15 02/28/2015   INR 1.06 02/14/2015   INR 1.0 02/19/2009   No results found for: PTT   Marvin [name] presents for multiple dental extractions with alveoloplasty and pre-prosthetic surgery as needed along with gross debridement of the remaining dentition in the operating room with general anesthesia.    SUBJECTIVE: The patient denies any acute medical or dental changes and agrees to proceed with treatment as planned. Will proceed with extraction of tooth numbers 1, 2, 7, 8, 15, 16, 17, and 32 and others if indicated along with alveoloplasty, pre-prosthetic surgery as needed, and gross debridement of remaining dentition.  EXAM: No sign of acute dental changes.  ASSESSMENT: Patient is affected by multiple retained root segments, chronic periodontitis, accretions, dental caries, and tooth mobility.  PLAN: Patient agrees to proceed with treatment as planned in the operating room as previously discussed and accepts the risks, benefits, and complications of the proposed treatment. Patient is aware of the risk for bleeding, bruising, swelling, infection, pain, nerve damage, soft tissue damage, damage to adjacent teeth, sinus involvement, root tip  fracture, mandible fracture, and the risks of complications associated with the anesthesia. Patient also is aware of the potential for other complications not mentioned above.   Lenn Cal, DDS

## 2015-03-05 NOTE — Anesthesia Preprocedure Evaluation (Signed)
Anesthesia Evaluation  Patient identified by MRN, date of birth, ID band Patient awake    Reviewed: Allergy & Precautions, NPO status , Patient's Chart, lab work & pertinent test results  Airway Mallampati: Trach       Dental   Pulmonary COPDCurrent Smoker,    Pulmonary exam normal       Cardiovascular Normal cardiovascular exam    Neuro/Psych Seizures -,  Anxiety    GI/Hepatic GERD-  ,  Endo/Other    Renal/GU      Musculoskeletal   Abdominal   Peds  Hematology   Anesthesia Other Findings   Reproductive/Obstetrics                             Anesthesia Physical Anesthesia Plan  ASA: III  Anesthesia Plan: General   Post-op Pain Management:    Induction: Intravenous  Airway Management Planned:   Additional Equipment:   Intra-op Plan:   Post-operative Plan:   Informed Consent: I have reviewed the patients History and Physical, chart, labs and discussed the procedure including the risks, benefits and alternatives for the proposed anesthesia with the patient or authorized representative who has indicated his/her understanding and acceptance.     Plan Discussed with: CRNA, Anesthesiologist and Surgeon  Anesthesia Plan Comments:         Anesthesia Quick Evaluation

## 2015-03-05 NOTE — Transfer of Care (Signed)
Immediate Anesthesia Transfer of Care Note  Patient: Marvin [name]  Procedure(s) Performed: Procedure(s): Extraction of tooth #'s 1,2,8,9,15,16,17,18,31,32 with alveoloplasty, mandibular right lateral exostoses, and gross debridement of remaining teeth (N/A)  Patient Location: PACU  Anesthesia Type:General  Level of Consciousness: awake, alert , oriented and patient cooperative  Airway & Oxygen Therapy: Patient Spontanous Breathing and Patient connected to tracheostomy mask oxygen  Post-op Assessment: Report given to RN, Post -op Vital signs reviewed and stable, Patient moving all extremities and Patient moving all extremities X 4  Post vital signs: Reviewed and stable  Last Vitals:  Filed Vitals:   03/05/15 0900  BP: 122/71  Pulse: 102  Temp: 37.6 C  Resp: 18    Complications: No apparent anesthesia complications

## 2015-03-05 NOTE — Progress Notes (Signed)
11 Days Post-Op  Subjective: No complaints.  Objective: Vital signs in last 24 hours: Temp:  [99.1 F (37.3 C)-100.8 F (38.2 C)] 99.7 F (37.6 C) (07/18 0508) Pulse Rate:  [94-108] 108 (07/18 0508) Resp:  [18] 18 (07/18 0508) BP: (128-138)/(83-87) 132/87 mmHg (07/18 0508) SpO2:  [91 %-96 %] 93 % (07/18 0508) FiO2 (%):  [21 %] 21 % (07/17 1543) Weight:  [64.2 kg (141 lb 8.6 oz)-65.862 kg (145 lb 3.2 oz)] 64.2 kg (141 lb 8.6 oz) (07/18 0508) Last BM Date: 03/04/15  Intake/Output from previous day: 07/17 0701 - 07/18 0700 In: 2691 [P.O.:840; I.V.:1100; NG/GT:751] Out: 1925 [Urine:1925] Intake/Output this shift: Total I/O In: -  Out: 1075 [Urine:1075]  General appearance: alert, cooperative and no distress Neck: #6 cuffless Shiley trach in place, mild secretions.  Lab Results:   Recent Labs  03/05/15 0408  WBC 10.7*  HGB 10.4*  HCT 30.8*  PLT 496*   BMET No results for input(s): NA, K, CL, CO2, GLUCOSE, BUN, CREATININE, CALCIUM in the last 72 hours. PT/INR No results for input(s): LABPROT, INR in the last 72 hours. ABG No results for input(s): PHART, HCO3 in the last 72 hours.  Invalid input(s): PCO2, PO2  Studies/Results: Dg Swallowing Func-speech Pathology  03/03/2015    Objective Swallowing Evaluation:    Patient Details  Name: Marvin [name] MRN: [id number] Date of Birth: [birth year]  Today's Date: 03/03/2015 Time: SLP Start Time (ACUTE ONLY): 0820-SLP Stop Time (ACUTE ONLY): 0900 SLP Time Calculation (min) (ACUTE ONLY): 40 min  Past Medical History:  Past Medical History  Diagnosis Date  . ETOH abuse   . Alcohol dependence 04/17/2012  . COPD (chronic obstructive pulmonary disease)   . Shortness of breath dyspnea     occ  . Anxiety   . GERD (gastroesophageal reflux disease)   . Seizures     one episode 09/2012, suspected related to ETOH withdrawal   Past Surgical History:  Past Surgical History  Procedure Laterality Date  . Knee surgery Left     kneecap -screws  .  Appendectomy    . Tracheostomy tube placement N/A 02/22/2015    Procedure: TRACHEOSTOMY;  Surgeon: [name], MD;  Location: [place];   Service: ENT;  Laterality: N/A;  awake tracheostomy  . Direct laryngoscopy N/A 02/22/2015    Procedure: DIRECT LARYNGOSCOPY;  Surgeon: [name], MD;  Location:  [facility] OR;  Service: ENT;  Laterality: N/A;  direct laryngoscopy with biopsy   HPI:  Other Pertinent Information: 39 yo male with h/o ETOH abuse, COPD,  anxiety, GERD, and seizures (suspect related to ETOH withdrawal), who  presents with c/o throat pain, dysphagia, and changes in vocal quality  that have worsened over the last two months. 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 Per MD note, there is associated  narrowing of the airway.  No Data Recorded  Assessment / Plan / Recommendation CHL IP CLINICAL IMPRESSIONS 03/03/2015  Therapy Diagnosis Moderate oral phase dysphagia  Clinical Impression MBS was completed with the PMSV in place.  The patient  presents with sensory/motor pharyngeal dysphagia characterized by  pharyngeal residue in the vallecular and pyriform sinuses as well as mild  pharyngeal wall stasis.  The patient demonstrated silent penetration  during the swallow across all liquid textures which he struggled to clear  given therapist directed cough.  Penetration was not observed given  numerous puree boluses.  Recommend a dypshagia 1 diet with pudding thick  liquids.  Aspiration  precautions as follows:  sit upright for all intake,  1 small bite at a time and the patient MUST wear PMSV with all intake.  ST  to follow for therapeutic diet tolerance and swallowing therapy during  acute stay.  The patient was given swallowing exercises to work in  independently each day.  Recommend follow up at next level as well.       CHL IP TREATMENT RECOMMENDATION 03/03/2015  Treatment Recommendations Therapy as outlined in treatment plan below     CHL IP DIET RECOMMENDATION 03/03/2015  SLP Diet  Recommendations Dysphagia 1 (Puree)  Liquid Administration via (None)  Medication Administration Via alternative means  Compensations Slow rate;Small sips/bites  Postural Changes and/or Swallow Maneuvers (None)     CHL IP OTHER RECOMMENDATIONS 03/03/2015  Recommended Consults (None)  Oral Care Recommendations Oral care QID;Other (Comment)  Other Recommendations Place PMSV during PO intake;Prohibited food (jello,  ice cream, thin soups);Remove water pitcher;Have oral suction  available;Order thickener from pharmacy     CHL IP FOLLOW UP RECOMMENDATIONS 03/02/2015  Follow up Recommendations Skilled Nursing facility     [facility] IP FREQUENCY AND DURATION 03/03/2015  Speech Therapy Frequency (ACUTE ONLY) min 3x week  Treatment Duration 2 weeks     Pertinent Vitals/Pain The patient was wearing his PMSV when he arrived in  radiology.  O2 levels fluctuated dropping as low as 86.  The patient's  nurse was called and asked to come to radiology.  The patient tolerated  the procedure well.  At end of procedure it was noted that the O2 monitor  tape was not applied correctly.  When that was corrected the patient's O2  levels jumped to 96-97.        Marvin [name] 03/03/2015, 9:24 AM  Shelly Flatten, MA, CCC-SLP Acute Rehab SLP [phone]      Anti-infectives: Anti-infectives    Start     Dose/Rate Route Frequency Ordered Stop   03/05/15 1145  ceFAZolin (ANCEF) IVPB 2 g/50 mL premix     2 g 100 mL/hr over 30 Minutes Intravenous To ShortStay Surgical 03/04/15 2317 03/06/15 1145   02/28/15 0800  ceFAZolin (ANCEF) IVPB 2 g/50 mL premix    Comments:  Hang ON CALL to Radiology 7/13   2 g 100 mL/hr over 30 Minutes Intravenous To Radiology 02/27/15 1450 02/28/15 1621      Assessment/Plan: Supraglottic carcinoma, dysphagia, malnutrition, alcohol dependence s/p Procedure(s) with comments: TRACHEOSTOMY (N/A) - awake tracheostomy DIRECT LARYNGOSCOPY (N/A) - direct laryngoscopy with biopsy  G-tube placement Plan dental  extractions today.  Discharge planning continues.  LOS: 11 days    Marvin [name] 03/05/2015

## 2015-03-05 NOTE — Progress Notes (Signed)
SLP Cancellation Note  Patient Details Name: Marvin [name] MRN: [id number] DOB: 1976/05/12   Cancelled treatment:       Reason Eval/Treat Not Completed: Patient at procedure or test/unavailable  Germain Osgood, M.A. CCC-SLP [phone]  Germain Osgood 03/05/2015, 2:11 PM

## 2015-03-05 NOTE — Anesthesia Procedure Notes (Signed)
Procedure Name: Intubation Date/Time: 03/05/2015 9:59 AM Performed by: Izora Gala Pre-anesthesia Checklist: Patient identified, Emergency Drugs available, Suction available and Patient being monitored Patient Re-evaluated:Patient Re-evaluated prior to inductionOxygen Delivery Method: Circle system utilized Preoxygenation: Pre-oxygenation with 100% oxygen Intubation Type: IV induction and Tracheostomy Ventilation: Mask ventilation without difficulty Placement Confirmation: positive ETCO2

## 2015-03-06 ENCOUNTER — Encounter: Payer: Self-pay | Admitting: *Deleted

## 2015-03-06 ENCOUNTER — Encounter (HOSPITAL_COMMUNITY): Payer: Self-pay | Admitting: Dentistry

## 2015-03-06 ENCOUNTER — Inpatient Hospital Stay: Payer: Self-pay | Admitting: Family Medicine

## 2015-03-06 ENCOUNTER — Inpatient Hospital Stay (HOSPITAL_COMMUNITY): Payer: Medicaid Other

## 2015-03-06 DIAGNOSIS — K08409 Partial loss of teeth, unspecified cause, unspecified class: Secondary | ICD-10-CM

## 2015-03-06 LAB — GLUCOSE, CAPILLARY
GLUCOSE-CAPILLARY: 111 mg/dL — AB (ref 65–99)
GLUCOSE-CAPILLARY: 123 mg/dL — AB (ref 65–99)
GLUCOSE-CAPILLARY: 124 mg/dL — AB (ref 65–99)
Glucose-Capillary: 120 mg/dL — ABNORMAL HIGH (ref 65–99)
Glucose-Capillary: 117 mg/dL — ABNORMAL HIGH (ref 65–99)
Glucose-Capillary: 141 mg/dL — ABNORMAL HIGH (ref 65–99)
Glucose-Capillary: 113 mg/dL — ABNORMAL HIGH (ref 65–99)

## 2015-03-06 LAB — CBC WITH DIFFERENTIAL/PLATELET
Basophils Absolute: 0 10*3/uL (ref 0.0–0.1)
Basophils Relative: 0 % (ref 0–1)
EOS PCT: 1 % (ref 0–5)
Eosinophils Absolute: 0.1 10*3/uL (ref 0.0–0.7)
HCT: 30.6 % — ABNORMAL LOW (ref 39.0–52.0)
Hemoglobin: 10.5 g/dL — ABNORMAL LOW (ref 13.0–17.0)
LYMPHS ABS: 0.8 10*3/uL (ref 0.7–4.0)
Lymphocytes Relative: 7 % — ABNORMAL LOW (ref 12–46)
MCH: 33.4 pg (ref 26.0–34.0)
MCHC: 34.3 g/dL (ref 30.0–36.0)
MCV: 97.5 fL (ref 78.0–100.0)
Monocytes Absolute: 1.2 10*3/uL — ABNORMAL HIGH (ref 0.1–1.0)
Monocytes Relative: 11 % (ref 3–12)
NEUTROS ABS: 8.6 10*3/uL — AB (ref 1.7–7.7)
Neutrophils Relative %: 81 % — ABNORMAL HIGH (ref 43–77)
PLATELETS: 518 10*3/uL — AB (ref 150–400)
RBC: 3.14 MIL/uL — AB (ref 4.22–5.81)
RDW: 12.9 % (ref 11.5–15.5)
WBC: 10.7 10*3/uL — ABNORMAL HIGH (ref 4.0–10.5)

## 2015-03-06 MED ORDER — AMPICILLIN-SULBACTAM SODIUM 3 (2-1) G IJ SOLR
3.0000 g | Freq: Four times a day (QID) | INTRAMUSCULAR | Status: AC
  Administered 2015-03-06 – 2015-03-07 (×4): 3 g via INTRAVENOUS
  Filled 2015-03-06 (×5): qty 3

## 2015-03-06 MED ORDER — CHLORHEXIDINE GLUCONATE 0.12 % MT SOLN
15.0000 mL | Freq: Two times a day (BID) | OROMUCOSAL | Status: DC
  Administered 2015-03-06 – 2015-03-08 (×5): 15 mL via OROMUCOSAL
  Filled 2015-03-06 (×5): qty 15

## 2015-03-06 MED ORDER — SODIUM CHLORIDE 0.9 % IR SOLN
200.0000 mL | Status: DC
  Administered 2015-03-06: 200 mL

## 2015-03-06 MED ORDER — BACITRACIN-NEOMYCIN-POLYMYXIN OINTMENT TUBE
TOPICAL_OINTMENT | Freq: Three times a day (TID) | CUTANEOUS | Status: DC
  Administered 2015-03-06 – 2015-03-08 (×7): via TOPICAL
  Filled 2015-03-06: qty 15

## 2015-03-06 NOTE — Progress Notes (Signed)
  Oncology Nurse Navigator Documentation   Navigator Encounter Type: Other (03/06/15 1000)   Visited patient at [facility], introduced myself as his navigator, explained my role as a member of his Care Team.  He is recovering well from yesterday's dental extractions.  I discussed post-discharge plan for his PET and CT SIM this Friday.    He acknowledged that SNF placement has yet to be determined. I provided my contact information, encouraged him to call me with questions/concerns.  I assured him I will be in contact with the SNF when identified.  [name], RN, BSN, [facility] [phone]

## 2015-03-06 NOTE — Clinical Social Work Note (Signed)
CSW received phone call from patient's sister Ainsley Sanguinetti to discuss SNF placement for patient.  CSW informed patient's sister that CSW is still looking for placement options.  CSW updated patient on the SNF search progress and he expressed his appreciation for CSW working on trying to find placement for patient.  CSW to continue following patient's progress throughout discharge planning.  [name]. [place], MSW, [place] 03/06/2015 11:23 AM

## 2015-03-06 NOTE — Consult Note (Signed)
WOC wound consult note Reason for Consult: Consult requested for trach site.  Pt has erosion around lower trach stoma related to constant thick secretions draining over site.  He states it is raw and painful. Wound type: Full thickness trach stoma site. Measurement: Approx .5X.5cm, unable to measure depth Wound bed: Red and moist Drainage (amount, consistency, odor) No odor, large amt thick tan trach secretions have been constantly soiling affected area. Periwound: Generalized erythremia surrounding site. Dressing procedure/placement/frequency: Aquacel packed into wound to provide antimicrobial benefits and absorb drainage.  Tegaderm to protect from becoming soiled by secretions. Trach drainage dressings can be changed PRN when soiled.  Discussed plan of care with patient and he verbalizes understanding.  Please re-consult if further assistance is needed.  Thank-you,  Julien Girt MSN, [place], [place], [place], [place]

## 2015-03-06 NOTE — Progress Notes (Signed)
POST OPERATIVE NOTE: Postop day #1  03/06/2015   [name] [phone]  VITALS: BP 109/66 mmHg  Pulse 86  Temp(Src) 99.4 F (37.4 C) (Oral)  Resp 18  Ht 6\' 1"  (1.854 m)  Wt 101 lb 13.6 oz (46.2 kg)  BMI 13.44 kg/m2  SpO2 100%  LABS:  Lab Results  Component Value Date   WBC 10.7* 03/05/2015   HGB 10.4* 03/05/2015   HCT 30.8* 03/05/2015   MCV 96.3 03/05/2015   PLT 496* 03/05/2015   BMET    Component Value Date/Time   NA 135 02/14/2015 1439   K 4.2 02/14/2015 1439   CL 100* 02/14/2015 1439   CO2 24 02/14/2015 1439   GLUCOSE 81 02/14/2015 1439   BUN <5* 02/14/2015 1439   CREATININE 0.65 02/22/2015 1731   CALCIUM 8.5* 02/14/2015 1439   GFRNONAA >60 02/22/2015 1731   GFRAA >60 02/22/2015 1731    Lab Results  Component Value Date   INR 1.15 02/28/2015   INR 1.06 02/14/2015   INR 1.0 02/19/2009   No results found for: PTT   [name] is status post multiple extractions with alveoloplasty, pre-prosthetic surgery, and gross debridement of remaining dentition in the operating room with general anesthesia on 03/05/2015. Patient was made aware of the chronic, advanced periodontal disease and tooth mobility. Patient is aware of the need for future periodontal evaluation and possible future dental extractions as indicated after chemoradiation therapy.  SUBJECTIVE: Patient with minimal discomfort. Denies having any active bleeding.  EXAM: There is no sign of infection, heme, or ooze. Sutures are intact. Clots are present. Intraoral swelling and bruising is noted. Dry Lips are noted.   ASSESSMENT: Post operative course is consistent with dental procedures performed in the operating room with general anesthesia. Chronic periodontitis  PLAN: 1. Chlorhexidine rinses twice daily. 2. Salt water rinses every 2 hours in between the chlorhexidine rinses. 3. Okay for discharge from dental standpoint.   Lenn Cal, DDS

## 2015-03-06 NOTE — Progress Notes (Signed)
1 Day Post-Op  Subjective: Complains of pain around the G-tube site.  Less appetite.  Been ambulating.  Coughing out some secretions.  Objective: Vital signs in last 24 hours: Temp:  [98.1 F (36.7 C)-103.2 F (39.6 C)] 99.4 F (37.4 C) (07/19 0418) Pulse Rate:  [77-117] 86 (07/19 0418) Resp:  [13-22] 18 (07/19 0418) BP: (107-122)/(62-78) 109/66 mmHg (07/19 0418) SpO2:  [91 %-100 %] 100 % (07/19 0418) FiO2 (%):  [28 %] 28 % (07/19 0418) Weight:  [46.2 kg (101 lb 13.6 oz)] 46.2 kg (101 lb 13.6 oz) (07/19 0418) Last BM Date: 03/04/15  Intake/Output from previous day: 07/18 0701 - 07/19 0700 In: 5023 [I.V.:3123; NG/GT:1900] Out: 1150 [Urine:1150] Intake/Output this shift:    General appearance: alert, cooperative and no distress Neck: #6 cuffless Shiley in place, mild secretions around trach site, site looks pretty good, some cloudy secretions from tube. GI: soft, non-tender except right around G-tube site, site looks good with no particular redness  Lab Results:   Recent Labs  03/05/15 0408  WBC 10.7*  HGB 10.4*  HCT 30.8*  PLT 496*   BMET No results for input(s): NA, K, CL, CO2, GLUCOSE, BUN, CREATININE, CALCIUM in the last 72 hours. PT/INR No results for input(s): LABPROT, INR in the last 72 hours. ABG No results for input(s): PHART, HCO3 in the last 72 hours.  Invalid input(s): PCO2, PO2  Studies/Results: No results found.  Anti-infectives: Anti-infectives    Start     Dose/Rate Route Frequency Ordered Stop   03/05/15 1145  ceFAZolin (ANCEF) IVPB 2 g/50 mL premix     2 g 100 mL/hr over 30 Minutes Intravenous To ShortStay Surgical 03/04/15 2317 03/05/15 1002   02/28/15 0800  ceFAZolin (ANCEF) IVPB 2 g/50 mL premix    Comments:  Hang ON CALL to Radiology 7/13   2 g 100 mL/hr over 30 Minutes Intravenous To Radiology 02/27/15 1450 02/28/15 1621      Assessment/Plan: Supraglottic carcinoma, dysphagia, malnutrition, fever s/p DL with biopsy and  tracheostomy, G-tube placement Procedure(s): Extraction of tooth #'s 1,2,8,9,15,16,17,18,31,32 with alveoloplasty, mandibular right lateral exostoses, and gross debridement of remaining teeth (N/A) Will have IR come by and check abdomen.  Will order chest x-ray, sputum culture, and cbc.  Continue discharge planning.  Ambulate.  LOS: 12 days    Marvin [name] 03/06/2015

## 2015-03-06 NOTE — Progress Notes (Signed)
Speech Language Pathology Treatment: Dysphagia;Passy Muir Speaking valve  Patient Details Name: Marvin [name] MRN: [id number] DOB: 05/19/1976 Today's Date: 03/06/2015 Time: 1550-1610 SLP Time Calculation (min) (ACUTE ONLY): 20 min  Assessment / Plan / Recommendation Clinical Impression  Pt seen following trach change by RT after trach was found out. Current trach remains #6 cuffless, with PMSV donned by RT. Pt reports he continues to wear PMSV throughout waking hours, and requires Min cues for recall of care and maintenance instructions. Pt consumed trials of puree without overt signs of aspiration (although note that aspiration was silent on MBS). Min cues were provided for pacing and appropriate bite size. SLP also provided education regarding proper thickening of liquids. Will continue to follow for diet tolerance and education.   HPI Other Pertinent Information: 39 yo male with h/o ETOH abuse, COPD, anxiety, GERD, and seizures (suspect related to ETOH withdrawal), who presents with c/o throat pain, dysphagia, and changes in vocal quality that have worsened over the last two months. 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 Per MD note, there is associated narrowing of the airway.   Pertinent Vitals Pain Assessment: No/denies pain  SLP Plan  Continue with current plan of care    Recommendations Diet recommendations: Dysphagia 1 (puree);Pudding-thick liquid Liquids provided via: Teaspoon Medication Administration: Via alternative means Supervision: Patient able to self feed;Full supervision/cueing for compensatory strategies Compensations: Slow rate;Small sips/bites (with PMV in place) Postural Changes and/or Swallow Maneuvers: Seated upright 90 degrees      Patient may use Passy-Muir Speech Valve: During all waking hours (remove during sleep);During PO intake/meals PMSV Supervision: Intermittent       Oral Care Recommendations: Oral care BID Follow up  Recommendations: Outpatient SLP Plan: Continue with current plan of care    Germain Osgood, M.A. CCC-SLP [phone]  Germain Osgood 03/06/2015, 4:32 PM

## 2015-03-06 NOTE — Progress Notes (Signed)
RT Note: Sputum sample was obtained per MD order. Lab contacted RT notifying us that the order was entered incorrectly since patient has a trach in place. RT called Dr. Redmond Baseman' office to get the correct order entered so that the lab can perform the test. Dr. Redmond Baseman was unable to come to the phone but a verbal telephone order was given and RBV through his nurse Gweneth Dimitri for the correct Respiratory Culture order. Rt will continue to monitor. Specimen requisition was sent to lab so the test will not be delayed.

## 2015-03-06 NOTE — Progress Notes (Signed)
Referring Physician(s): Dr Redmond Baseman  Chief Complaint:  abd pain  Subjective:  Percutaneous G tube placed in IR 02/28/2015 Has done well with tube In use Did complain to MD of pain at site Noted spike in temp: T Max 103.2 Dr Redmond Baseman has asked for evaluation of site and recommendation  Pt states he no longer has pain at G tube site No abd pain    Allergies: Review of patient's allergies indicates no known allergies.  Medications: Prior to Admission medications   Medication Sig Start Date End Date Taking? Authorizing Provider  HYDROcodone-acetaminophen (HYCET) 7.5-325 mg/15 ml solution Take 15 mLs by mouth 4 (four) times daily as needed for moderate pain. 01/22/15 01/22/16 Yes Linton Flemings, MD  naproxen (NAPROSYN) 500 MG tablet Take 1 tablet (500 mg total) by mouth 2 (two) times daily. Patient not taking: Reported on 02/14/2015 10/09/14   Britt Bottom, NP  sulfamethoxazole-trimethoprim (SEPTRA DS) 800-160 MG per tablet Take 1 tablet by mouth 2 (two) times daily. Patient not taking: Reported on 10/09/2014 08/21/13   Clayton Bibles, PA-C     Vital Signs: BP 109/66 mmHg  Pulse 88  Temp(Src) 99.4 F (37.4 C) (Oral)  Resp 18  Ht 6\' 1"  (1.854 m)  Wt 101 lb 13.6 oz (46.2 kg)  BMI 13.44 kg/m2  SpO2 92%  Physical Exam  Abdominal: Soft. Bowel sounds are normal. He exhibits no distension. There is no tenderness.  Site of G tube is clean and dry No bleeding; no redness; no sign of infection G tube in use No leakage Bumper slightly away from skin----cinched flush to skin NT to palpation  T 99.4     Imaging: Dg Swallowing Func-speech Pathology  03/03/2015    Objective Swallowing Evaluation:    Patient Details  Name: Marvin [name] MRN: [id number] Date of Birth: 1976/04/16  Today's Date: 03/03/2015 Time: SLP Start Time (ACUTE ONLY): 0820-SLP Stop Time (ACUTE ONLY): 0900 SLP Time Calculation (min) (ACUTE ONLY): 40 min  Past Medical History:  Past Medical History  Diagnosis Date  .  ETOH abuse   . Alcohol dependence 04/17/2012  . COPD (chronic obstructive pulmonary disease)   . Shortness of breath dyspnea     occ  . Anxiety   . GERD (gastroesophageal reflux disease)   . Seizures     one episode 09/2012, suspected related to ETOH withdrawal   Past Surgical History:  Past Surgical History  Procedure Laterality Date  . Knee surgery Left     kneecap -screws  . Appendectomy    . Tracheostomy tube placement N/A 02/22/2015    Procedure: TRACHEOSTOMY;  Surgeon: [name], MD;  Location: [place];   Service: ENT;  Laterality: N/A;  awake tracheostomy  . Direct laryngoscopy N/A 02/22/2015    Procedure: DIRECT LARYNGOSCOPY;  Surgeon: [name], MD;  [facility]  [facility];  Service: ENT;  Laterality: N/A;  direct laryngoscopy with biopsy   HPI:  Other Pertinent Information: 39 yo male with h/o ETOH abuse, COPD,  anxiety, GERD, and seizures (suspect related to ETOH withdrawal), who  presents with c/o throat pain, dysphagia, and changes in vocal quality  that have worsened over the last two months. 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 Per MD note, there is associated  narrowing of the airway.  No Data Recorded  Assessment / Plan / Recommendation CHL IP CLINICAL IMPRESSIONS 03/03/2015  Therapy Diagnosis Moderate oral phase dysphagia  Clinical Impression MBS was completed with the PMSV in  place.  The patient  presents with sensory/motor pharyngeal dysphagia characterized by  pharyngeal residue in the vallecular and pyriform sinuses as well as mild  pharyngeal wall stasis.  The patient demonstrated silent penetration  during the swallow across all liquid textures which he struggled to clear  given therapist directed cough.  Penetration was not observed given  numerous puree boluses.  Recommend a dypshagia 1 diet with pudding thick  liquids.  Aspiration precautions as follows:  sit upright for all intake,  1 small bite at a time and the patient MUST wear PMSV with all intake.  ST  to follow  for therapeutic diet tolerance and swallowing therapy during  acute stay.  The patient was given swallowing exercises to work in  independently each day.  Recommend follow up at next level as well.       CHL IP TREATMENT RECOMMENDATION 03/03/2015  Treatment Recommendations Therapy as outlined in treatment plan below     CHL IP DIET RECOMMENDATION 03/03/2015  SLP Diet Recommendations Dysphagia 1 (Puree)  Liquid Administration via (None)  Medication Administration Via alternative means  Compensations Slow rate;Small sips/bites  Postural Changes and/or Swallow Maneuvers (None)     CHL IP OTHER RECOMMENDATIONS 03/03/2015  Recommended Consults (None)  Oral Care Recommendations Oral care QID;Other (Comment)  Other Recommendations Place PMSV during PO intake;Prohibited food (jello,  ice cream, thin soups);Remove water pitcher;Have oral suction  available;Order thickener from pharmacy     CHL IP FOLLOW UP RECOMMENDATIONS 03/02/2015  Follow up Recommendations Skilled Nursing facility     [facility] IP FREQUENCY AND DURATION 03/03/2015  Speech Therapy Frequency (ACUTE ONLY) min 3x week  Treatment Duration 2 weeks     Pertinent Vitals/Pain The patient was wearing his PMSV when he arrived in  radiology.  O2 levels fluctuated dropping as low as 86.  The patient's  nurse was called and asked to come to radiology.  The patient tolerated  the procedure well.  At end of procedure it was noted that the O2 monitor  tape was not applied correctly.  When that was corrected the patient's O2  levels jumped to 96-97.        Lamar Sprinkles 03/03/2015, 9:24 AM  Shelly Flatten, MA, CCC-SLP Acute Rehab SLP [phone]      Labs:  CBC:  Recent Labs  02/22/15 1731 02/25/15 0240 02/28/15 0333 03/05/15 0408  WBC 5.8 6.4 5.2 10.7*  HGB 13.4 11.8* 11.1* 10.4*  HCT 38.5* 34.2* 31.8* 30.8*  PLT 111* 85* 181 496*    COAGS:  Recent Labs  02/14/15 1439 02/28/15 0333  INR 1.06 1.15  APTT 35 35    BMP:  Recent Labs  01/21/15 2033  02/14/15 1439 02/22/15 1731  NA 133* 135  --   K 3.6 4.2  --   CL 94* 100*  --   CO2  --  24  --   GLUCOSE 90 81  --   BUN <3* <5*  --   CALCIUM  --  8.5*  --   CREATININE 1.20 0.46* 0.65  GFRNONAA  --  >60 >60  GFRAA  --  >60 >60    LIVER FUNCTION TESTS:  Recent Labs  02/14/15 1439  BILITOT 0.4  AST 56*  ALT 27  ALKPHOS 113  PROT 7.7  ALBUMIN 3.3*    Assessment and Plan:  G tube intact In use without issues No sign of infection Pt with denial of pain Continue use Suggested to pt if G tube  gives him any trouble to please have RN call us  Signed: Bryanda Mikel A 03/06/2015, 9:29 AM   I spent a total of 15 Minutes in face to face in clinical consultation/evaluation, greater than 50% of which was counseling/coordinating care for G tube

## 2015-03-06 NOTE — Progress Notes (Signed)
RT Note: RT went in patients room and found patients trach 3/4 of the way out. I attempted to re insert it using the obturator but was unable to advance the trach tube back in. I called the charge RT Edward Jolly to also come and try and paged the physician as well. Neither Katey nor I were able to advance the trach tube back in. Dr. Redmond Baseman called back and gave [place] suggestions on how to re insert it since he was unavailable to come see the patient at this time. The old trach was then removed and the new one was inserted while being instructed over the phone by Dr. Redmond Baseman. The new trach was successfully inserted. Positive color change was noted on ETCo2 and bilateral chest rise and breath sounds were noted. Patient now has a new #6 cuffless shiley which is the same as what was in before. Stoma was reddened and a wound was noted under the stoma which the RN is aware of. A moderate amount of thick tan secretions were suctioned from patients trach. He tolerated the whole ordeal very well and his Spo2 never dropped below 93%. Patient continues on his 28% trach collar and is wearing his PMSV again as well. Speech therapy  Is currently in with the patient. Rt will continue to monitor.

## 2015-03-06 NOTE — Care Management Note (Signed)
Case Management Note  Patient Details  Name: WILEY MAGAN MRN: [id number] Date of Birth: 1976/05/12  Subjective/Objective:                    Action/Plan:   Expected Discharge Date:                  Expected Discharge Plan:  [place] (pt is homeless)  In-House Referral:  Clinical Social Work (pt homeless)  Discharge planning Services  CM Consult  Post Acute Care Choice:    Choice offered to:     DME Arranged:    DME Agency:     HH Arranged:    [place] Agency:     Status of Service:  In process, will continue to follow  Medicare Important Message Given:    Date Medicare IM Given:    Medicare IM give by:    Date Additional Medicare IM Given:    Additional Medicare Important Message give by:     If discussed at [place] of Stay Meetings, dates discussed:  03-06-15   Additional Comments:  Marilu Favre, RN 03/06/2015, 10:37 AM

## 2015-03-06 NOTE — Anesthesia Postprocedure Evaluation (Signed)
  Anesthesia Post-op Note  Patient: Marvin [name]  Procedure(s) Performed: Procedure(s): Extraction of tooth #'s 1,2,8,9,15,16,17,18,31,32 with alveoloplasty, mandibular right lateral exostoses, and gross debridement of remaining teeth (N/A)  Patient Location: PACU  Anesthesia Type:General  Level of Consciousness: awake, alert , oriented and patient cooperative  Airway and Oxygen Therapy: Patient Spontanous Breathing  Post-op Pain: none  Post-op Assessment: Post-op Vital signs reviewed, Patient's Cardiovascular Status Stable, Respiratory Function Stable, Patent Airway, No signs of Nausea or vomiting and Pain level controlled              Post-op Vital Signs: stable  Last Vitals:  Filed Vitals:   03/06/15 1555  BP:   Pulse: 85  Temp:   Resp: 18    Complications: No apparent anesthesia complications

## 2015-03-07 LAB — GLUCOSE, CAPILLARY
GLUCOSE-CAPILLARY: 134 mg/dL — AB (ref 65–99)
GLUCOSE-CAPILLARY: 118 mg/dL — AB (ref 65–99)
GLUCOSE-CAPILLARY: 114 mg/dL — AB (ref 65–99)
GLUCOSE-CAPILLARY: 135 mg/dL — AB (ref 65–99)
Glucose-Capillary: 112 mg/dL — ABNORMAL HIGH (ref 65–99)
Glucose-Capillary: 155 mg/dL — ABNORMAL HIGH (ref 65–99)

## 2015-03-07 MED ORDER — AMOXICILLIN-POT CLAVULANATE 875-125 MG PO TABS
1.0000 | ORAL_TABLET | Freq: Two times a day (BID) | ORAL | Status: DC
  Administered 2015-03-07 – 2015-03-08 (×2): 1 via ORAL
  Filled 2015-03-07 (×3): qty 1

## 2015-03-07 NOTE — Clinical Social Work Note (Signed)
CSW received phone call from Scientist, physiological at [facility].  SNF will be able to take patient either tomorrow or Friday depending on when he is medically ready.  CSW contacted patient's sister Rosezetta Schlatter and aunt Thayer Headings to inform her that patient has a bed available for SNF once he is ready to discharge.  Patient's family was very appreciative of medical team trying to find a placement for patient.  CSW gave family contact information for [facility] and [place] [facility].  CSW to continue to follow patient's discharge planning.  FL2 on patient's chart awaiting signature by physician.  [name]. [place], MSW, [place] 03/07/2015 12:22 PM

## 2015-03-07 NOTE — Progress Notes (Signed)
2 Days Post-Op  Subjective: No pain in abdomen today.  Eating lunch currently.  Trach tube came out yesterday but was able to be replaced by RT.  Tolerating tube feeds.  Objective: Vital signs in last 24 hours: Temp:  [99.1 F (37.3 C)-99.3 F (37.4 C)] 99.3 F (37.4 C) (07/20 0522) Pulse Rate:  [79-113] 88 (07/20 0522) Resp:  [18] 18 (07/20 0522) BP: (121-131)/(78-81) 121/81 mmHg (07/20 0522) SpO2:  [95 %-98 %] 96 % (07/20 1224) FiO2 (%):  [28 %] 28 % (07/20 1224) Weight:  [64.365 kg (141 lb 14.4 oz)] 64.365 kg (141 lb 14.4 oz) (07/20 0522) Last BM Date: 03/06/15  Intake/Output from previous day: 07/19 0701 - 07/20 0700 In: 1800 [P.O.:600; I.V.:1200] Out: 1740 [Urine:1725; Drains:15] Intake/Output this shift: Total I/O In: 120 [P.O.:120] Out: 625 [Urine:625]  General appearance: alert, cooperative and no distress Neck: #6 cuffless Shiley in place with Passy-Muir valve.  Minor drainage around tube.  Lab Results:   Recent Labs  03/05/15 0408 03/06/15 1030  WBC 10.7* 10.7*  HGB 10.4* 10.5*  HCT 30.8* 30.6*  PLT 496* 518*   BMET No results for input(s): NA, K, CL, CO2, GLUCOSE, BUN, CREATININE, CALCIUM in the last 72 hours. PT/INR No results for input(s): LABPROT, INR in the last 72 hours. ABG No results for input(s): PHART, HCO3 in the last 72 hours.  Invalid input(s): PCO2, PO2  Studies/Results: Dg Chest 2v Repeat Same Day  03/06/2015   CLINICAL DATA:  Fever and shortness of breath for 1 week. COPD. Tracheostomy. Laryngeal carcinoma.  EXAM: CHEST - 2 VIEW SAME DAY  COMPARISON:  02/25/2015  FINDINGS: Tracheostomy tube remains in place. Pulmonary hyperinflation again seen, consistent with COPD. Worsening airspace disease is seen in the posterior left lower lobe, suspicious for pneumonia. Right lung remains clear. Tiny left pleural effusion versus pleural thickening noted. Heart size remains within normal limits.  IMPRESSION: Increased airspace disease in posterior  left lower lobe, suspicious for pneumonia. Followup PA and lateral chest X-ray is recommended in 3-4 weeks following trial of antibiotic therapy to ensure resolution and exclude underlying malignancy.  COPD.   Electronically Signed   By: Earle Gell M.D.   On: 03/06/2015 10:13    Anti-infectives: Anti-infectives    Start     Dose/Rate Route Frequency Ordered Stop   03/06/15 1230  Ampicillin-Sulbactam (UNASYN) 3 g in sodium chloride 0.9 % 100 mL IVPB     3 g 100 mL/hr over 60 Minutes Intravenous Every 6 hours 03/06/15 1132 03/07/15 0742   03/05/15 1145  ceFAZolin (ANCEF) IVPB 2 g/50 mL premix     2 g 100 mL/hr over 30 Minutes Intravenous To ShortStay Surgical 03/04/15 2317 03/05/15 1002   02/28/15 0800  ceFAZolin (ANCEF) IVPB 2 g/50 mL premix    Comments:  Hang ON CALL to Radiology 7/13   2 g 100 mL/hr over 30 Minutes Intravenous To Radiology 02/27/15 1450 02/28/15 1621      Assessment/Plan: Right supraglottic carcinoma, dysphagia, malnutrition, alcohol abuse, left lower lobe pneumonia s/p DL with biopsy, awake tracheostomy, G-tube Procedure(s): Extraction of tooth #'s 1,2,8,9,15,16,17,18,31,32 with alveoloplasty, mandibular right lateral exostoses, and gross debridement of remaining teeth (N/A) Started on Unasyn yesterday due to left lower lobe pneumonia.  No fever spike in the last 24 hours.  Abdominal pain no longer present.  Tolerating trach and tube feedings.  Will have tube feeds changed to bolus and will change antibiotic to po tomorrow.  Likely discharge to SNF tomorrow.  LOS: 13 days    Marvin [name] 03/07/2015

## 2015-03-07 NOTE — Progress Notes (Addendum)
Nutrition Follow-up  DOCUMENTATION CODES:   Severe malnutrition in context of chronic illness, Underweight  INTERVENTION:    Recommend bolus feedings of Jevity 1.2, 1 can (237 ml) TID to provide an extra 855 kcals and 40 gm protein in addition to current PO intake.  When IVF d/c'ed recommend 250 ml free water flushes 6 times per day, since patient is receiving very little fluid with pudding thick liquids.  NUTRITION DIAGNOSIS:   Increased nutrient needs related to cancer and cancer related treatments as evidenced by estimated needs.  Ongoing  GOAL:   Patient will meet greater than or equal to 90% of their needs  Met  MONITOR:   Diet advancement, PO intake, Labs, Weight trends, TF tolerance  REASON FOR ASSESSMENT:   Consult Enteral/tube feeding initiation and management  ASSESSMENT:   39 year old male with over two months of throat pain that has worsened and is associated with dysphagia and muffled voice. He feels some difficulty breathing at times. He has smoked for a long time and used alcohol heavily. He has lost some weight. 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  S/P trach and direct laryngoscopy 7/8. S/P multiple tooth extractions on 7/18 in anticipation of cancer treatments. Patient reports eating very well, consuming 100% of meals. SLP following, continues on a dysphagia 1 diet with pudding thick liquids.  Patient is currently receiving Jevity 1.2 via PEG at 70 ml/h with to provide 2016 kcals, 93 gm protein, 1361 ml free water daily. Per Surgery progress note plans to change TF to a bolus regimen.   Plans for d/c to SNF tomorrow. Labs reviewed.  Diet Order:  DIET - DYS 1 Room service appropriate?: Yes; Fluid consistency:: Pudding Thick  Skin:  Reviewed, no issues  Last BM:  7/19  Height:   Ht Readings from Last 1 Encounters:  02/22/15  _0  (1.854 m)    Weight:   Wt Readings from Last 1 Encounters:  03/07/15 141 lb 14.4 oz (64.365 kg)    Ideal Body Weight:  83.6 kg (kg)  Wt Readings from Last 10 Encounters:  03/07/15 141 lb 14.4 oz (64.365 kg)  02/14/15 141 lb (63.957 kg)  08/19/13 149 lb 9 oz (67.841 kg)  09/24/12 130 lb (58.968 kg)  04/16/12 136 lb (61.689 kg)    BMI:  Body mass index is 18.73 kg/(m^2).  Estimated Nutritional Needs:   Kcal:  2000-2200  Protein:  90-110 gm  Fluid:  2.2 L  EDUCATION NEEDS:   Education needs addressed   Molli Barrows, [place], [place], [place] Pager [phone] After Hours Pager [phone]

## 2015-03-08 ENCOUNTER — Telehealth: Payer: Self-pay | Admitting: *Deleted

## 2015-03-08 ENCOUNTER — Other Ambulatory Visit: Payer: Self-pay | Admitting: Radiation Oncology

## 2015-03-08 LAB — GLUCOSE, CAPILLARY
GLUCOSE-CAPILLARY: 95 mg/dL (ref 65–99)
GLUCOSE-CAPILLARY: 107 mg/dL — AB (ref 65–99)
Glucose-Capillary: 108 mg/dL — ABNORMAL HIGH (ref 65–99)

## 2015-03-08 MED ORDER — AMOXICILLIN-POT CLAVULANATE 875-125 MG PO TABS: 1.0000 | ORAL_TABLET | Freq: Two times a day (BID) | ORAL | Status: DC

## 2015-03-08 MED ORDER — THIAMINE HCL 100 MG PO TABS: 100.0000 mg | ORAL_TABLET | Freq: Every day | ORAL | Status: DC

## 2015-03-08 MED ORDER — IBUPROFEN 200 MG PO TABS: 200.0000 mg | ORAL_TABLET | ORAL | Status: DC | PRN

## 2015-03-08 MED ORDER — HYDROCODONE-ACETAMINOPHEN 7.5-325 MG/15ML PO SOLN: 15.0000 mL | Freq: Four times a day (QID) | ORAL | Status: DC | PRN

## 2015-03-08 MED ORDER — BACITRACIN ZINC 500 UNIT/GM EX OINT: TOPICAL_OINTMENT | Freq: Two times a day (BID) | CUTANEOUS | Status: DC

## 2015-03-08 MED ORDER — NICOTINE 21 MG/24HR TD PT24: 21.0000 mg | MEDICATED_PATCH | Freq: Every day | TRANSDERMAL | Status: DC

## 2015-03-08 MED ORDER — FOLIC ACID 1 MG PO TABS: 1.0000 mg | ORAL_TABLET | Freq: Every day | ORAL | Status: DC

## 2015-03-08 MED ORDER — ADULT MULTIVITAMIN W/MINERALS CH: 1.0000 | ORAL_TABLET | Freq: Every day | ORAL | Status: DC

## 2015-03-08 NOTE — Clinical Social Work Placement (Signed)
   CLINICAL SOCIAL WORK PLACEMENT  NOTE  Date:  03/08/2015  Patient Details  Name: Marvin [name] MRN: [id number] Date of Birth: [birth year]  Clinical Social Work is seeking post-discharge placement for this patient at the [place] level of care (*CSW will initial, date and re-position this form in  chart as items are completed):  Yes   Patient/family provided with [place] Work Department's list of facilities offering this level of care within the geographic area requested by the patient (or if unable, by the patient's family).  Yes   Patient/family informed of their freedom to choose among providers that offer the needed level of care, that participate in Medicare, Medicaid or managed care program needed by the patient, have an available bed and are willing to accept the patient.  Yes   Patient/family informed of [facility]'s ownership interest in [facility], as well as of the fact that they are under no obligation to receive care at these facilities.  PASRR submitted to EDS on 03/07/15     PASRR number received on 03/07/15     Existing PASRR number confirmed on       FL2 transmitted to all facilities in geographic area requested by pt/family on       FL2 transmitted to all facilities within larger geographic area on 03/06/15     Patient informed that his/her managed care company has contracts with or will negotiate with certain facilities, including the following:        Yes   Patient/family informed of bed offers received.  Patient chooses bed at [facility] recommends and patient chooses bed at      Patient to be transferred to [facility] on 03/08/15.  Patient to be transferred to facility by [facility]     Patient family notified on 03/08/15 of transfer.  Name of family member notified:  Thomasene Mohair     PHYSICIAN       Additional Comment:     _______________________________________________ Ross Ludwig, [place] 03/08/2015, 1:16 PM

## 2015-03-08 NOTE — Clinical Social Work Note (Signed)
Clinical Social Work Assessment  Patient Details  Name: Marvin [name] MRN: [id number] Date of Birth: 05/22/1976  Date of referral:  03/07/15               Reason for consult:  Facility Placement                Permission sought to share information with:  Family Supports Permission granted to share information::  Yes, Verbal Permission Granted  Name::     Tomaz Janis patient's sister and [name] patient's aunt   Agency::  SNF admissions  Relationship::     Contact Information:     Housing/Transportation Living arrangements for the past 2 months:  Single Family Home Source of Information:  Patient, Other (Comment Required) (Patient has been living with his sister and his brother) Patient Interpreter Needed:  None Criminal Activity/Legal Involvement Pertinent to Current Situation/Hospitalization:  No - Comment as needed Significant Relationships:  Siblings, Other Family Members Lives with:  Relatives Do you feel safe going back to the place where you live?  No (Patient feels like he needs to have his trach taken care of appropriately before he is able to live with his family) Need for family participation in patient care:  No  Care giving concerns:  Patient and his family are concerned that his trach will need more care then they have time to help him with.  Patient will also be starting cancer treatment soon once the plan is determined.   Social Worker assessment / plan:  Patient is a pleasant 39 year old male who was living with his sister Rosezetta Schlatter, but he does switch between his sister's house and his brother's house.  Patient is talkative and nervous about the plans for his treatment, but expressed gratitude that there is a facility that is willing to take patient.  Patient and family stated they are concerned about his trach and that they will not be able to help take care of it currently, but are willing to learn.  Patient plans to go to SNF for short term trach care,  feeding tube, and to begin treatments for his cancer.  Patient states that after 30 days he is hoping to move in to his brother's house.  Patient's sister informed CSW that his brother just bought a house and is fixing it up.  Patient's family are very supportive of his plan of care and are able to help him throughout his recovery process.  Patient stated he is hopeful the treatments will be successful, and he will recover.  CSW explained to patient about going to a SNF and how they will be able to help with his trach care and help with his feeding tube.  Patient stated he did not have any other questions, CSW spoke with patient's family and they did not have any questions either.  Employment status:  Unemployed Forensic scientist:  Self Pay (Medicaid Pending) PT Recommendations:  Not assessed at this time Information / Referral to community resources:     Patient/Family's Response to care:  Patient and family are agreeable to going to SNF for trach and feeding tube care.  Patient/Family's Understanding of and Emotional Response to Diagnosis, Current Treatment, and Prognosis:  Patient and family are nervous about he cancer treatments, but are aware of the struggles they will be facing.  Emotional Assessment Appearance:  Appears stated age Attitude/Demeanor/Rapport:    Affect (typically observed):  Anxious, Overwhelmed, Calm, Pleasant, Quiet Orientation:  Oriented to Self, Oriented to  Place, Oriented to  Time, Oriented to Situation Alcohol / Substance use:  Alcohol Use Psych involvement (Current and /or in the community):  No (Comment)  Discharge Needs  Concerns to be addressed:  No discharge needs identified Readmission within the last 30 days:  No Current discharge risk:  Inadequate Financial Supports Barriers to Discharge:  Inadequate or no insurance   Anell Barr 03/08/2015, 12:53 PM

## 2015-03-08 NOTE — Telephone Encounter (Signed)
  Oncology Nurse Navigator Documentation   Navigator Encounter Type: Telephone (03/08/15 1612)        To facilitate care progression, called [facility], spoke with Hilda Blades who coordinates pt transportation.   She verbalized understanding that patient has a 0730 arrival to [facility] for PET after which he has a 1000 CT SIM at [facility]. She indicated patient will be transported by PTAR to [facility], that she can be contacted [phone]) when CT SIM is completed and for arranging PTAR return to  [place].        [name], RN, BSN, [place] at [place] [phone]

## 2015-03-08 NOTE — Clinical Social Work Note (Signed)
Patient to be d/c'ed today to [facility].  Patient and family agreeable to plans will transport via ems RN to call report.  Evette Cristal, MSW, [place]

## 2015-03-08 NOTE — Telephone Encounter (Signed)
  Oncology Nurse Navigator Documentation   Navigator Encounter Type: Telephone (03/08/15 1518)   Called patient's sister to make introduction, discuss post-DC plan for tomorrow morning's PET and CT SIM.  LVM requesting call back.   [name], RN, BSN, [place] at [place] [phone]

## 2015-03-08 NOTE — Discharge Summary (Signed)
Physician Discharge Summary  Patient ID: Marvin [name] MRN: [id number] DOB/AGE: 1975-10-20 39 y.o.  Admit date: 02/22/2015 Discharge date: 03/08/2015  Admission Diagnoses: Right supraglottic carcinoma, dysphagia, malnutrition, alcohol abuse, tobacco abuse  Discharge Diagnoses:  Active Problems:   Carcinoma of supraglottis   Protein-calorie malnutrition, severe   Dysphagia   Malnutrition   Chronic periodontitis Alcohol abuse Tobacco abuse Left lower lobe pneumonia  Discharged Condition: good  Hospital Course: 39 year old male brought to the operating room on 7/7 for direct laryngoscopy with biopsy of what turned out to represent a right supraglottic carcinoma.  With concerns for airway management, a tracheostomy was placed at the time.  He was managed after surgery in a typical fashion and progressed.  He was treated for alcohol and tobacco withdrawal.  A G-tube was placed to aid with nutrition due to dysphagia and malnutrition.  His trach tube was changed to a cuffless tube.  Dental extractions were performed in preparation for radiation and chemotherapy for his cancer.  Consults from heme/onc and rad/onc were obtained.  He spiked a fever on a couple of days and evaluation revealed a left lower lobe pneumonia for which he was given Unasyn followed by Augmentin.  He has not had a fever spike in two days.  He should complete a 10 day course of Augmentin.  He is stable for transfer to a skilled nursing facility.  He is taking bolus feeds via G-tube, Jevety 1.2, one can three times per day plus 250 cc of free water six times per day.  He is scheduled for a PET scan at [facility] tomorrow and should be NPO with no tube feedings after midnight tonight.  He needs to arrive at the cancer center at 0730 for the PET scan and will stay for CT simulation at 1000.  Consults: hematology/oncology, rad/onc, IR, dentistry  Significant Diagnostic Studies: None  Treatments: surgery: Direct  laryngoscopy with biopsy, tracheostomy, G-tube placement, dental extractions  Discharge Exam: Blood pressure 125/82, pulse 78, temperature 99.1 F (37.3 C), temperature source Oral, resp. rate 18, height 6\' 1"  (1.854 m), weight 64.91 kg (143 lb 1.6 oz), SpO2 97 %. General appearance: alert, cooperative and no distress Neck: #6 cuffless Shiley trach tube in place with Passy-Muir valve, mild drainage around tube  Disposition: 01-Home or Self Care  Discharge Instructions    Discharge instructions    Complete by:  As directed   Clean trach site twice daily with half strength peroxide and apply Bacitracin ointment.  Dysphagia 1 diet.  Jevity 1.2 one can TID via G-tube.  Free water 250 cc six times per day via G-tube.  He is scheduled for a PET scan at [facility] and should arrive there at 0730 after being NPO with no tube feedings after midnight.  He will then stay at [facility] for a [place] at 1000.     Increase activity slowly    Complete by:  As directed             Medication List    STOP taking these medications        naproxen 500 MG tablet  Commonly known as:  NAPROSYN     sulfamethoxazole-trimethoprim 800-160 MG per tablet  Commonly known as:  SEPTRA DS      TAKE these medications        amoxicillin-clavulanate 875-125 MG per tablet  Commonly known as:  AUGMENTIN  Take 1 tablet by mouth every 12 (twelve)  hours.     bacitracin ointment  Apply topically 2 (two) times daily. Apply to trach site twice daily.     folic acid 1 MG tablet  Commonly known as:  FOLVITE  Take 1 tablet (1 mg total) by mouth daily.     HYDROcodone-acetaminophen 7.5-325 mg/15 ml solution  Commonly known as:  HYCET  Take 15 mLs by mouth 4 (four) times daily as needed for moderate pain.     ibuprofen 200 MG tablet  Commonly known as:  ADVIL,MOTRIN  Take 1-2 tablets (200-400 mg total) by mouth every 4 (four) hours as needed for fever or moderate pain.     multivitamin with  minerals Tabs tablet  Take 1 tablet by mouth daily.     nicotine 21 mg/24hr patch  Commonly known as:  NICODERM CQ - dosed in mg/24 hours  Place 1 patch (21 mg total) onto the skin daily.     thiamine 100 MG tablet  Take 1 tablet (100 mg total) by mouth daily.           Follow-up Information    Follow up with [name], MD.   Specialty:  Otolaryngology   Why:  After radiation treatments.   Contact information:   [address] [place] [address] [phone]       Signed: [name] 03/08/2015, 8:54 AM

## 2015-03-08 NOTE — Telephone Encounter (Signed)
Duplicate entry

## 2015-03-09 ENCOUNTER — Ambulatory Visit
Admit: 2015-03-09 | Discharge: 2015-03-09 | Disposition: A | Discharge status: Home / Self Care | Payer: Medicaid Other | Source: Ambulatory Visit | Attending: Radiation Oncology | Admitting: Radiation Oncology

## 2015-03-09 ENCOUNTER — Encounter: Payer: Self-pay | Admitting: *Deleted

## 2015-03-09 ENCOUNTER — Ambulatory Visit (HOSPITAL_COMMUNITY)
Admission: RE | Admit: 2015-03-09 | Discharge: 2015-03-09 | Disposition: A | Discharge status: Home / Self Care | Payer: Medicaid Other | Source: Ambulatory Visit | Attending: Radiation Oncology | Admitting: Radiation Oncology

## 2015-03-09 ENCOUNTER — Telehealth: Payer: Self-pay | Admitting: *Deleted

## 2015-03-09 DIAGNOSIS — Z681 Body mass index (BMI) 19 or less, adult: Secondary | ICD-10-CM | POA: Diagnosis not present

## 2015-03-09 DIAGNOSIS — C321 Malignant neoplasm of supraglottis: Secondary | ICD-10-CM | POA: Insufficient documentation

## 2015-03-09 DIAGNOSIS — R634 Abnormal weight loss: Secondary | ICD-10-CM | POA: Diagnosis not present

## 2015-03-09 DIAGNOSIS — Z510 Encounter for antineoplastic radiation therapy: Secondary | ICD-10-CM | POA: Insufficient documentation

## 2015-03-09 LAB — CULTURE, BLOOD (ROUTINE X 2)
CULTURE: NO GROWTH
Culture: NO GROWTH

## 2015-03-09 LAB — GLUCOSE, CAPILLARY: Glucose-Capillary: 109 mg/dL — ABNORMAL HIGH (ref 65–99)

## 2015-03-09 LAB — CULTURE, RESPIRATORY W GRAM STAIN

## 2015-03-09 LAB — CULTURE, RESPIRATORY

## 2015-03-09 MED ORDER — FLUDEOXYGLUCOSE F - 18 (FDG) INJECTION
7.0000 | Freq: Once | INTRAVENOUS | Status: AC | PRN
  Administered 2015-03-09: 7 via INTRAVENOUS

## 2015-03-09 NOTE — Progress Notes (Signed)
  Oncology Nurse Navigator Documentation   To provide support and encouragement, care continuity: 1.  Spoke with [facility] [phone], confirmed:  Patient's arrival from [facility] for PET via PTAR,  Pre-arrangement to maintain IV for use during CT SIM,  That Radiology can bring him to [facility], register him, bring him to RadOnc for [facility]. 2.   Called [facility] as SIM finished to arrange PTAR return. 3.   Intermittently checked on/assisted patient while he waited in [place] until PTAR arrived.  4.  We discussed his living arrangement s/p a projected 30 days at [facility], including transportation to [facility] for tmt.  He stated his aunt can drop him off in the morning before she goes to work, he can take bus home.    We discussed other arrangements, including [facility] with which he is familiar.    I notified LCSW Lauren for follow-up. I will continue to closely monitor patient's appts and transportation needs.   [name], RN, BSN, [place] at [place] [phone]

## 2015-03-09 NOTE — Progress Notes (Signed)
Head and Neck Cancer Simulation, IMRT treatment planning, and Special treatment procedure note   Outpatient  Diagnosis:    ICD-9-CM ICD-10-CM   1. Carcinoma of supraglottis 161.1 C32.1     The patient was taken to the CT simulator and laid in the supine position on the table. An Aquaplast head and shoulder mask was custom fitted to the patient's anatomy. High-resolution CT axial imaging was obtained of the head and neck with contrast. I verified that the quality of the imaging is good for treatment planning. 1 Medically Necessary Treatment Device was fabricated and supervised by me: Aquaplast mask.   Treatment planning note I plan to treat the patient with IMRT. I plan to treat the patient's tumor and bilateral neck nodes. I plan to treat to a total dose of 70 Gray in 35  fractions. Dose calculation was ordered from dosimetry.  IMRT planning Note  IMRT is an important modality to deliver adequate dose to the patient's at risk tissues while sparing the patient's normal structures, including the: esophagus, parotid tissue, mandible, brain stem, spinal cord, oral cavity, brachial plexus.  This justifies the use of IMRT in the patient's treatment.   Special Treatment Procedure Note:  The patient may be receiving chemotherapy concurrently. Chemotherapy heightens the risk of side effects. I have considered this during the patient's treatment planning process and will monitor the patient accordingly for side effects on a weekly basis. Concurrent chemotherapy increases the complexity of this patient's treatment and therefore this constitutes a special treatment procedure.  -----------------------------------  [name], MD

## 2015-03-09 NOTE — Telephone Encounter (Signed)
  Oncology Nurse Navigator Documentation   Navigator Encounter Type: Telephone (03/09/15 1025)        Per her guidance yesterday, called [facility], to notify patient is ready for return via [place].  She is to arrange.   [name], RN, BSN, [place] at [place] [phone]

## 2015-03-09 NOTE — Telephone Encounter (Signed)
  Oncology Nurse Navigator Documentation   Navigator Encounter Type: Telephone (03/09/15 0920)     Patient's sister, Thurston Hole, returned my VM from yesterday. I introduced myself, explained my role as the H&N Navigator, confirmed my contact information. She explained:  Upon DC from [facility], patient will be living with his brother who lives down the street from her home.  He works away from home over extended periods of time so patient generally will be by himself most of the time.  She noted she will be checking in on him regularly.  I noted we will have a SW from [facility] reach out to patient to evaluate opportunities for assistance. I answered her questions re tmt plan based on available information. I encouraged her to contact me with questions/concerns as her brother moves forward with tmt. She expressed appreciation for yesterday's call, our discussion.   [name], RN, BSN, [place] at [place] [phone]

## 2015-03-12 ENCOUNTER — Telehealth: Payer: Self-pay | Admitting: *Deleted

## 2015-03-12 ENCOUNTER — Encounter: Payer: Self-pay | Admitting: Internal Medicine

## 2015-03-12 ENCOUNTER — Ambulatory Visit: Payer: Self-pay

## 2015-03-12 ENCOUNTER — Encounter: Payer: Self-pay | Admitting: Hematology and Oncology

## 2015-03-12 ENCOUNTER — Non-Acute Institutional Stay (SKILLED_NURSING_FACILITY): Payer: Medicaid Other | Admitting: Internal Medicine

## 2015-03-12 ENCOUNTER — Other Ambulatory Visit: Payer: Self-pay | Admitting: *Deleted

## 2015-03-12 ENCOUNTER — Encounter: Payer: Self-pay | Admitting: *Deleted

## 2015-03-12 ENCOUNTER — Telehealth: Payer: Self-pay | Admitting: Hematology and Oncology

## 2015-03-12 ENCOUNTER — Ambulatory Visit (HOSPITAL_BASED_OUTPATIENT_CLINIC_OR_DEPARTMENT_OTHER): Payer: Medicaid Other | Admitting: Hematology and Oncology

## 2015-03-12 ENCOUNTER — Ambulatory Visit: Payer: MEDICAID

## 2015-03-12 ENCOUNTER — Other Ambulatory Visit: Payer: Self-pay | Admitting: Hematology and Oncology

## 2015-03-12 VITALS — BP 95/63 | HR 101 | Temp 98.0°F | Resp 18 | Ht 73.0 in | Wt 132.9 lb

## 2015-03-12 DIAGNOSIS — F419 Anxiety disorder, unspecified: Secondary | ICD-10-CM

## 2015-03-12 DIAGNOSIS — E43 Unspecified severe protein-calorie malnutrition: Secondary | ICD-10-CM | POA: Diagnosis not present

## 2015-03-12 DIAGNOSIS — K0530 Chronic periodontitis, unspecified: Secondary | ICD-10-CM | POA: Diagnosis not present

## 2015-03-12 DIAGNOSIS — F1010 Alcohol abuse, uncomplicated: Secondary | ICD-10-CM | POA: Diagnosis not present

## 2015-03-12 DIAGNOSIS — C321 Malignant neoplasm of supraglottis: Secondary | ICD-10-CM

## 2015-03-12 DIAGNOSIS — K219 Gastro-esophageal reflux disease without esophagitis: Secondary | ICD-10-CM | POA: Diagnosis not present

## 2015-03-12 DIAGNOSIS — Z931 Gastrostomy status: Secondary | ICD-10-CM | POA: Diagnosis not present

## 2015-03-12 DIAGNOSIS — Z720 Tobacco use: Secondary | ICD-10-CM

## 2015-03-12 HISTORY — DX: Anxiety disorder, unspecified: F41.9

## 2015-03-12 MED ORDER — PROCHLORPERAZINE MALEATE 10 MG PO TABS: 10.0000 mg | ORAL_TABLET | Freq: Four times a day (QID) | ORAL | Status: DC | PRN

## 2015-03-12 MED ORDER — CITALOPRAM HYDROBROMIDE 20 MG PO TABS: 20.0000 mg | ORAL_TABLET | Freq: Every day | ORAL | Status: DC

## 2015-03-12 MED ORDER — LIDOCAINE-PRILOCAINE 2.5-2.5 % EX CREA: TOPICAL_CREAM | CUTANEOUS | Status: DC

## 2015-03-12 MED ORDER — ONDANSETRON HCL 8 MG PO TABS: 8.0000 mg | ORAL_TABLET | Freq: Three times a day (TID) | ORAL | Status: DC | PRN

## 2015-03-12 NOTE — Assessment & Plan Note (Signed)
SNF plan - nicoderm patch

## 2015-03-12 NOTE — Assessment & Plan Note (Addendum)
Dx with bx;pt was trached, teeth pulled and PEG placed in preparation for XRT and chemo; SNF plan - hycet and supportive care; CBC to folow for neutropenia and anemia

## 2015-03-12 NOTE — Assessment & Plan Note (Signed)
SNF plan - ativan started today 1mg  TID for 7  Days then 0.5 mg TID

## 2015-03-12 NOTE — Telephone Encounter (Signed)
Pt confirmed labs/ov per 07/25 POF, gave pt AVS and Calendar.... KJ, sent msg to add chemo & IVF NP

## 2015-03-12 NOTE — Assessment & Plan Note (Signed)
All teeth were pulled in prep for chemo with neutropenia;SNF plan - supp care

## 2015-03-12 NOTE — Telephone Encounter (Signed)
Per staff message and POF I have scheduled appts. Advised scheduler of appts. JMW  

## 2015-03-12 NOTE — Progress Notes (Signed)
Checked in new pt with no insurance.  Pt has applied for Medicaid and per his case worker, Ms. Helene Kelp his application is still pending.  I gave him an application to apply for a discount thru the hospital and we also talked about the [place] and what's needed to apply for that.  Pt verbalized understanding.

## 2015-03-12 NOTE — Assessment & Plan Note (Addendum)
SNF plan - thiamine 100 mg and folate 1 ; BMP 1 week, expect electrolyte problems

## 2015-03-12 NOTE — Assessment & Plan Note (Signed)
SNF plan - PEG feeding

## 2015-03-12 NOTE — Telephone Encounter (Signed)
    Oncology Nurse Navigator Documentation   Spoke with Hilda Blades, informed patient has 1145 appt this am, I indicated he should be brought to RadOnc.  She will arrange PTAR. I indicated I will send an appt calendar back with patient for upcoming appts.   [name], RN, BSN, [place] at [place] [phone]'

## 2015-03-12 NOTE — Progress Notes (Signed)
MRN: [id number] Name: BENECIO KLUGER  Sex: male Age: 39 y.o. DOB: Mar 29, 1976  [place] [facility] Facility/Room:321 Level Of Care: SNF Provider: Inocencio Homes D Emergency Contacts: Extended Emergency Contact Information Primary Emergency Contact: [place], [place] [address] Johnnette Litter of [place] Phone: [phone] Work Phone: [phone] Relation: Sister Secondary Emergency Contact: Jefm Miles of [place] Phone: [phone] Mobile Phone: [phone] Relation: Aunt  Code Status:   Allergies: Review of patient's allergies indicates no known allergies.  Chief Complaint  Patient presents with  . New Admit To SNF    HPI: Patient is 39 y.o. male who was admitted to hospital from 7/7 - 21 with 2 month hx throat pain, muffled voice and weight loss in the setting of ETOH and tobacco abuse. Pt underwent CT which showed supraglottic mass then laryngoscopy bx which returned R supraglottic CA. In preparation for tx pt's teeth were pulled for chronic peridontitis and PEG tube placed. A fever spike during hosp revealed LLL pNA and pt was tx with zosyn , then  Augmentin. Pt is admitted to SNF for generalized weakness in the setting of CA and ETOH abuse with poor nutrition and PNA. While at SNF pt will be treated for his ETOH abuse with thiamine and folate, tobacco abuse with nicoderm patch and GERD with omeprazole.  Past Medical History  Diagnosis Date  . ETOH abuse   . Alcohol dependence 04/17/2012  . COPD (chronic obstructive pulmonary disease)   . Shortness of breath dyspnea     occ  . Anxiety   . GERD (gastroesophageal reflux disease)   . Seizures     one episode 09/2012, suspected related to ETOH withdrawal  . Anxiety disorder 03/12/2015    Past Surgical History  Procedure Laterality Date  . Knee surgery Left     kneecap -screws  . Appendectomy    . Tracheostomy tube placement N/A 02/22/2015    Procedure: TRACHEOSTOMY;   Surgeon: [name], MD;  Location: [facility];  Service: ENT;  Laterality: N/A;  awake tracheostomy  . Direct laryngoscopy N/A 02/22/2015    Procedure: DIRECT LARYNGOSCOPY;  Surgeon: [name], MD;  Location: [facility];  Service: ENT;  Laterality: N/A;  direct laryngoscopy with biopsy  . Multiple extractions with alveoloplasty N/A 03/05/2015    Procedure: Extraction of tooth #'s 1,2,8,9,15,16,17,18,31,32 with alveoloplasty, mandibular right lateral exostoses, and gross debridement of remaining teeth;  Surgeon: Lenn Cal, DDS;  Location: [facility];  Service: Oral Surgery;  Laterality: N/A;      Medication List       This list is accurate as of: 03/12/15  8:54 PM.  Always use your most recent med list.               folic acid 1 MG tablet  Commonly known as:  FOLVITE  Take 1 tablet (1 mg total) by mouth daily.     HYDROcodone-acetaminophen 7.5-325 mg/15 ml solution  Commonly known as:  HYCET  Take 15 mLs by mouth 4 (four) times daily as needed for moderate pain.     ibuprofen 200 MG tablet  Commonly known as:  ADVIL,MOTRIN  Take 1-2 tablets (200-400 mg total) by mouth every 4 (four) hours as needed for fever or moderate pain.     multivitamin with minerals Tabs tablet  Take 1 tablet by mouth daily.     nicotine 21 mg/24hr patch  Commonly known as:  NICODERM CQ -  dosed in mg/24 hours  Place 1 patch (21 mg total) onto the skin daily.     thiamine 100 MG tablet  Take 1 tablet (100 mg total) by mouth daily.        No orders of the defined types were placed in this encounter.    Immunization History  Administered Date(s) Administered  . Pneumococcal Polysaccharide-23 02/25/2015    History  Substance Use Topics  . Smoking status: Current Every Day Smoker -- 1.00 packs/day for 28 years    Types: Cigarettes  . Smokeless tobacco: Never Used  . Alcohol Use: Yes     Comment: 6 -40-oz. beers daily    Family history is + CA    Review of Systems  DATA OBTAINED: from  patient GENERAL:  no fevers, fatigue, appetite changes SKIN: No itching, rash or wounds EYES: No eye pain, redness, discharge EARS: No earache, tinnitus, change in hearing NOSE: No congestion, drainage or bleeding  MOUTH/THROAT: No mouth or tooth pain, No sore throat RESPIRATORY: No cough, wheezing, SOB CARDIAC: No chest pain, palpitations, lower extremity edema  GI: No abdominal pain, No N/V/D or constipation, No heartburn or reflux  GU: No dysuria, frequency or urgency, or incontinence  MUSCULOSKELETAL: No unrelieved bone/joint pain NEUROLOGIC: No headache, dizziness or focal weakness PSYCHIATRIC:  c/o anxiety    Filed Vitals:   03/12/15 1201  BP: 130/76  Pulse: 71  Temp: 98.1 F (36.7 C)  Resp: 19    SpO2 Readings from Last 1 Encounters:  03/12/15 97%        Physical Exam  GENERAL APPEARANCE: Alert, conversant,  No acute distress.  SKIN: No diaphoresis rash HEAD: Normocephalic, atraumatic  EYES: Conjunctiva/lids clear. Pupils round, reactive. EOMs intact.  EARS: External exam WNL, canals clear. Hearing grossly normal.  NOSE: No deformity or discharge.  MOUTH/THROAT: Lips w/o lesions  RESPIRATORY: Breathing is even, unlabored. Lung sounds are upper resp noise; trach CARDIOVASCULAR: Heart RRR no murmurs, rubs or gallops. No peripheral edema.   GASTROINTESTINAL: Abdomen is soft, non-tender, not distended w/ normal bowel sounds. GENITOURINARY: Bladder non tender, not distended  MUSCULOSKELETAL: No abnormal joints or musculature NEUROLOGIC:  Cranial nerves 2-12 grossly intact. Moves all extremities  PSYCHIATRIC: Mood and affect appropriate to situation, no behavioral issues  Patient Active Problem List   Diagnosis Date Noted  . GERD (gastroesophageal reflux disease) 03/12/2015  . Anxiety disorder 03/12/2015  . Alcohol abuse 03/12/2015  . Tobacco abuse 03/12/2015  . 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 Protein-calorie malnutrition, severe 02/27/2015  . Dysphagia    . Malnutrition   . 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 Transaminitis 04/19/2012  . Alcohol dependence 04/17/2012    CBC    Component Value Date/Time   WBC 10.7* 03/06/2015 1030   RBC 3.14* 03/06/2015 1030   HGB 10.5* 03/06/2015 1030   HCT 30.6* 03/06/2015 1030   PLT 518* 03/06/2015 1030   MCV 97.5 03/06/2015 1030   LYMPHSABS 0.8 03/06/2015 1030   MONOABS 1.2* 03/06/2015 1030   EOSABS 0.1 03/06/2015 1030   BASOSABS 0.0 03/06/2015 1030    CMP     Component Value Date/Time   NA 135 02/14/2015 1439   K 4.2 02/14/2015 1439   CL 100* 02/14/2015 1439   CO2 24 02/14/2015 1439   GLUCOSE 81 02/14/2015 1439   BUN <5* 02/14/2015 1439   CREATININE 0.65 02/22/2015 1731   CALCIUM 8.5* 02/14/2015 1439   PROT 7.7 02/14/2015 1439   ALBUMIN 3.3* 02/14/2015 1439   AST  56* 02/14/2015 1439   ALT 27 02/14/2015 1439   ALKPHOS 113 02/14/2015 1439   BILITOT 0.4 02/14/2015 1439   GFRNONAA >60 02/22/2015 1731   GFRAA >60 02/22/2015 1731    No results found for: HGBA1C   Nm Pet Image Initial (pi) Skull Base To Thigh  03/09/2015   CLINICAL DATA:  Initial treatment strategy for staging of supraglottic carcinoma. Ethanol abuse. COPD. Alcohol dependence. Gastroesophageal reflux disease.  EXAM: NUCLEAR MEDICINE PET SKULL BASE TO THIGH  TECHNIQUE: 7.0 mCi F-18 FDG was injected intravenously. Full-ring PET imaging was performed from the skull base to thigh after the radiotracer. CT data was obtained and used for attenuation correction and anatomic localization.  FASTING BLOOD GLUCOSE:  Value: 109 mg/dl  COMPARISON:  Abdominal CT of 02/27/2015. Chest radiograph 03/06/2015. Neck CT 01/22/2015.  FINDINGS: NECK  Hypermetabolic mass centered at the right side of the vallecula and piriform sinuses. This measures a S.U.V. max of 13.7 on image 34 of series 4.  Right level 2 node measures 11 mm and a S.U.V. max of 11.6 on image 29.  No left-sided hypermetabolic cervical nodes.  CHEST  Hypermetabolism along the  tract of the tracheostomy which is likely reactive. This measures a S.U.V. max of 8.3.  Mediastinal and right hilar nodal hypermetabolism. A right paratracheal node measures 6 mm and a S.U.V. max of 3.8 on image 62.  Subcarinal node measures 11 mm and a S.U.V. max of 5.3 on image 76.  Left worse than right lower lobe patchy airspace disease with left lower lobe dense consolidation. This is hypermetabolic. No suspicious pulmonary nodule or mass.  ABDOMEN/PELVIS  Hypermetabolism along the gastrostomy tube tract which is likely reactive. No abdominal pelvic nodal hypermetabolism.  SKELETON  No abnormal marrow activity.  CT IMAGES PERFORMED FOR ATTENUATION CORRECTION  Mucosal thickening and fluid in the right maxillary sinus. Normal heart size, without pericardial effusion. Moderate centrilobular emphysema. Minimal extraluminal air within the anterior abdomen may be secondary to gastrostomy tube. Example image 131.  IMPRESSION: 1. Right piriform sinus mass with right level 2 nodal metastasis. 2. Worsened left greater than right lower lobe airspace disease, highly suspicious for aspiration or pneumonia. 3. Hypermetabolic thoracic nodes which are at least partially felt to be reactive. Nodal metastasis cannot be excluded and follow-up imaging is warranted after appropriate antibiotic therapy. 4. No evidence of subdiaphragmatic metastatic disease. 5. Interval placement of a gastrostomy tube since 02/27/2015. Minimal extraluminal air within the upper abdomen is favored to be secondary.   Electronically Signed   By: Abigail Miyamoto M.D.   On: 03/09/2015 10:11    Not all labs, radiology exams or other studies done during hospitalization come through on my EPIC note; however they are reviewed by me.    Assessment and Plan  Carcinoma of supraglottis Dx with bx;pt was trached, teeth pulled and PEG placed in preparation for XRT and chemo; SNF plan - hycet and supportive care; CBC to folow for neutropenia and anemia  GERD  (gastroesophageal reflux disease) SNF plan - omeprazole 20 mg daily started;pt at [facility] for aspiration (PEG)  Chronic periodontitis All teeth were pulled in prep for chemo with neutropenia;SNF plan - supp care  Protein-calorie malnutrition, severe SNF plan - PEG feeding  Anxiety disorder SNF plan - ativan started today 1mg  TID for 7  Days then 0.5 mg TID  Alcohol abuse SNF plan - thiamine 100 mg and folate 1 ; BMP 1 week, expect electrolyte problems  Tobacco abuse SNF plan -  nicoderm patch    Hennie Duos, MD

## 2015-03-12 NOTE — Assessment & Plan Note (Signed)
SNF plan - omeprazole 20 mg daily started;pt at [facility] for aspiration (PEG)

## 2015-03-12 NOTE — Progress Notes (Signed)
[name]  Clinical Social Work was referred by head and neck navigator for assessment of psychosocial needs.  Clinical Social Worker met with patient in medical oncology exam room to offer support and assess for needs.  [name] is currently living at [facility] and anticipates he will remain there a few more weeks.  He plans to transition home to live with his brother and his aunt lives nearby can transport him to cancer center appointments.  [name] has applied for Medicaid and social security disability with the help of his sister.  CSW encouraged patient to provide his sister with CSW contact information in case she needs any assistance or has questions.  [name] shared he has no concerns at this time and feels that he is coping well with his current situation.  CSW encouraged patient to call as needs arise.  Polo Riley, MSW, LCSW, OSW-C Clinical Social Worker [facility] [phone]

## 2015-03-12 NOTE — Telephone Encounter (Signed)
  Oncology Nurse Navigator Documentation   Navigator Encounter Type: Telephone (03/12/15 0947)      Spoke with Nelda Severe, Neuro Rehab, to adjust patient appt from 1445 to 1215 today to minimize patient's wait time with other appts.     [name], RN, BSN, [place] at [place] [phone]

## 2015-03-13 ENCOUNTER — Ambulatory Visit: Payer: Federal, State, Local not specified - Other | Admitting: Nutrition

## 2015-03-13 ENCOUNTER — Other Ambulatory Visit: Payer: Federal, State, Local not specified - Other

## 2015-03-13 ENCOUNTER — Telehealth: Payer: Self-pay | Admitting: *Deleted

## 2015-03-13 ENCOUNTER — Encounter: Payer: Self-pay | Admitting: Hematology and Oncology

## 2015-03-13 ENCOUNTER — Encounter: Payer: Self-pay | Admitting: *Deleted

## 2015-03-13 ENCOUNTER — Other Ambulatory Visit (HOSPITAL_BASED_OUTPATIENT_CLINIC_OR_DEPARTMENT_OTHER): Payer: Medicaid Other

## 2015-03-13 ENCOUNTER — Other Ambulatory Visit: Payer: Self-pay | Admitting: *Deleted

## 2015-03-13 ENCOUNTER — Other Ambulatory Visit: Payer: Self-pay | Admitting: Hematology and Oncology

## 2015-03-13 DIAGNOSIS — C321 Malignant neoplasm of supraglottis: Secondary | ICD-10-CM

## 2015-03-13 DIAGNOSIS — Z931 Gastrostomy status: Secondary | ICD-10-CM | POA: Insufficient documentation

## 2015-03-13 LAB — CBC WITH DIFFERENTIAL/PLATELET
BASO%: 0.6 % (ref 0.0–2.0)
Basophils Absolute: 0.1 10*3/uL (ref 0.0–0.1)
EOS ABS: 0.2 10*3/uL (ref 0.0–0.5)
EOS%: 1.8 % (ref 0.0–7.0)
HEMATOCRIT: 34.1 % — AB (ref 38.4–49.9)
HGB: 11.3 g/dL — ABNORMAL LOW (ref 13.0–17.1)
LYMPH#: 1.6 10*3/uL (ref 0.9–3.3)
LYMPH%: 19.2 % (ref 14.0–49.0)
MCH: 32.2 pg (ref 27.2–33.4)
MCHC: 33.1 g/dL (ref 32.0–36.0)
MCV: 97.2 fL (ref 79.3–98.0)
MONO#: 1 10*3/uL — ABNORMAL HIGH (ref 0.1–0.9)
MONO%: 11.3 % (ref 0.0–14.0)
NEUT%: 67.1 % (ref 39.0–75.0)
NEUTROS ABS: 5.7 10*3/uL (ref 1.5–6.5)
Platelets: 672 10*3/uL — ABNORMAL HIGH (ref 140–400)
RBC: 3.51 10*6/uL — ABNORMAL LOW (ref 4.20–5.82)
RDW: 13.2 % (ref 11.0–14.6)
WBC: 8.5 10*3/uL (ref 4.0–10.3)

## 2015-03-13 LAB — COMPREHENSIVE METABOLIC PANEL (CC13)
ALBUMIN: 2.6 g/dL — AB (ref 3.5–5.0)
ALT: 60 U/L — AB (ref 0–55)
AST: 51 U/L — AB (ref 5–34)
Alkaline Phosphatase: 111 U/L (ref 40–150)
Anion Gap: 9 mEq/L (ref 3–11)
BUN: 9.6 mg/dL (ref 7.0–26.0)
CHLORIDE: 103 meq/L (ref 98–109)
CO2: 25 mEq/L (ref 22–29)
CREATININE: 0.6 mg/dL — AB (ref 0.7–1.3)
Calcium: 9.5 mg/dL (ref 8.4–10.4)
EGFR: >90 mL/min/{1.73_m2} (ref >90)
Glucose: 98 mg/dl (ref 70–140)
Potassium: 4.2 mEq/L (ref 3.5–5.1)
Sodium: 136 mEq/L (ref 136–145)
Total Bilirubin: 0.46 mg/dL (ref 0.20–1.20)
Total Protein: 7.2 g/dL (ref 6.4–8.3)

## 2015-03-13 NOTE — Telephone Encounter (Signed)
  Oncology Nurse Navigator Documentation   Navigator Encounter Type: Telephone (03/13/15 1449)       [facility], informed Hilda Blades of 7/29 0930 appt at [facility] Radiology for [facility] placement.  Spoke with RN Arville Go, informed pt is to be NPO after midnight, may take am meds with small sips of water.  She confirmed pt is not taking blood thinners.   [name], RN, BSN, [place] at [place] [phone]

## 2015-03-13 NOTE — Assessment & Plan Note (Signed)
We discussed the role of chemotherapy. The intent is for cure.  We discussed some of the risks, benefits, side-effects of cisplatin. Some of the short term side-effects included, though not limited to, including weight loss, life threatening infections, risk of allergic reactions, need for transfusions of blood products, nausea, vomiting, change in bowel habits, loss of hair, admission to hospital for various reasons, and risks of death.   Long term side-effects are also discussed including risks of infertility, permanent damage to nerve function, hearing loss, chronic fatigue, kidney damage with possibility needing hemodialysis, and rare secondary malignancy including bone marrow disorders.  The patient is aware that the response rates discussed earlier is not guaranteed.  After a long discussion, patient made an informed decision to proceed with the prescribed plan of care and went ahead to sign the consent form today.   Patient education material was dispensed. I will order port placement and chemotherapy education class. We will plan to start treatment next week.

## 2015-03-13 NOTE — Assessment & Plan Note (Signed)
He has lost a lot of weight due to dysphagia. We will get him follow with speech and language therapist and follow up with nutritionist. He is currently dependent on tube feeds long-term but is able to have some pureed diet

## 2015-03-13 NOTE — Progress Notes (Signed)
  Oncology Nurse Navigator Documentation   Navigator Encounter Type: Other (03/12/15 1130)     To provide support and facilitate appts, met patient in Radiation Oncology as being brought from [place] by [place].  I provided wheelchair and placed him on 2L [place]. I escorted him to [facility] appt and then to f/u appt with Dr. Alvy Bimler. Per previous arrangement, I notified Polo Riley, LCSW, of his availability to see her.  She met him in [facility]'s exam room. I later facilitated his return to Radiation where he was picked up by [facility] for return to [place].   [name], RN, BSN, [place] at [place] [phone]

## 2015-03-13 NOTE — Assessment & Plan Note (Signed)
The patient have history of anxiety disorder without depression. Recommend starting him on Celexa for anxiety and as a mood stabilizer

## 2015-03-13 NOTE — Telephone Encounter (Signed)
  Oncology Nurse Navigator Documentation   Navigator Encounter Type: Telephone (03/13/15 0810)       Confirmed with [facility] that [facility] has been arranged for patient's transport to [facility] for today's appts starting with 0900 labs.   [name], RN, BSN, [place] at [place] [phone]

## 2015-03-13 NOTE — Assessment & Plan Note (Signed)
His feeding tube site looks clean. He is dependent on feeding tube for nutritional needs. I am concerned about high risk of dehydration while on cisplatin. I will see him on a weekly basis but recommend we start IV fluids the week after start date to prevent risk of dehydration.

## 2015-03-13 NOTE — Progress Notes (Signed)
  Oncology Nurse Navigator Documentation   Navigator Encounter Type: Other (03/13/15 1500)     Met patient in [facility] lobby where he was registering for labs and chemo ed.  He was accompanied by his aunt, [name].  He was in wheelchair on 2L [place]. Following labs, I facilitated his arrival to conference room for chemo ed. Following chemo ed, I facilitated his arrival to Nutrition appt with Dory Peru. Following nutrition appt, I facilitated his arrival to Radiation Waiting where he waited with his aunt for return to [facility] via [place]. Provided aunt Authorization For Use/Disclosure of Protected Health Information form to facilitate patient's application for disability. Provided aunt my contact information, encouraged her to call with questions/concerns.   [name], RN, BSN, [place] at [place] [phone]

## 2015-03-13 NOTE — Progress Notes (Signed)
[place] OFFICE PROGRESS NOTE  Patient Care Team: No Pcp Per Patient as PCP - General (General Practice) [name], MD as Consulting Physician (Otolaryngology) [name], MD as Attending Physician (Radiation Oncology) [name], MD as Consulting Physician (Hematology and Oncology) Leota Sauers, RN as Oncology Nurse Navigator  SUMMARY OF ONCOLOGIC HISTORY: Oncology History   Carcinoma of supraglottis   Staging form: Larynx - Supraglottis, AJCC 7th Edition     Clinical stage from 03/13/2015: Stage IVA (T3, N2b, M0) - Signed by [name], MD on 03/13/2015       Carcinoma of supraglottis   01/22/2015 Imaging CT scan showed enhancing infiltrative mass centered at the right piriform sinus with bilateral neck lymphadenopathy   02/22/2015 - 03/08/2015 Hospital Admission He was admitted to the hospital for management of advanced supraglottic cancer status post tracheostomy, biopsy, dental extraction, placement of feeding tube and subsequent discharge to skilled nursing facility   02/22/2015 Pathology Results Accession: [id number] 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   03/09/2015 PET scan 1)  Right piriform sinus mass with right level 2 nodal metastasis.  2)  Hypermetabolic thoracic nodes which are at least partially felt to be reactive.  3)  No evidence of subdiaphragmatic metastatic disease.     INTERVAL HISTORY: Please see below for problem oriented charting. He returns for further follow-up. He is doing well at the skilled nursing facility and getting a bit stronger. He is a long some pured diet but continues to have dysphagia. He denies pain. He complained of significant anxiety. He denies depression  REVIEW OF SYSTEMS:   Constitutional: Denies fevers, chills or abnormal weight loss Eyes: Denies blurriness of vision Ears, nose, mouth,  throat, and face: Denies mucositis or sore throat Respiratory: Denies cough, dyspnea or wheezes Cardiovascular: Denies palpitation, chest discomfort or lower extremity swelling Gastrointestinal:  Denies nausea, heartburn or change in bowel habits Skin: Denies abnormal skin rashes Lymphatics: Denies new lymphadenopathy or easy bruising Neurological:Denies numbness, tingling or new weaknesses Behavioral/Psych: Mood is stable, no new changes  All other systems were reviewed with the patient and are negative.  I have reviewed the past medical history, past surgical history, social history and family history with the patient and they are unchanged from previous note.  ALLERGIES:  has No Known Allergies.  MEDICATIONS:  Current Outpatient Prescriptions  Medication Sig Dispense Refill  . folic acid (FOLVITE) 1 MG tablet Take 1 tablet (1 mg total) by mouth daily. 30 tablet 0  . HYDROcodone-acetaminophen (HYCET) 7.5-325 mg/15 ml solution Take 15 mLs by mouth 4 (four) times daily as needed for moderate pain. 120 mL 0  . ibuprofen (ADVIL,MOTRIN) 200 MG tablet Take 1-2 tablets (200-400 mg total) by mouth every 4 (four) hours as needed for fever or moderate pain. 30 tablet 0  . Multiple Vitamin (MULTIVITAMIN WITH MINERALS) TABS tablet Take 1 tablet by mouth daily. 30 tablet 0  . nicotine (NICODERM CQ - DOSED IN MG/24 HOURS) 21 mg/24hr patch Place 1 patch (21 mg total) onto the skin daily. 28 patch 0  . thiamine 100 MG tablet Take 1 tablet (100 mg total) by mouth daily. 30 tablet 0   No current facility-administered medications for this visit.    PHYSICAL EXAMINATION: ECOG PERFORMANCE STATUS: 1 - Symptomatic but completely ambulatory  Filed Vitals:   03/12/15 1215  BP: 95/63  Pulse: 101  Temp: 98 F (  36.7 C)  Resp: 18   Filed Weights   03/12/15 1215  Weight: 132 lb 14.4 oz (60.283 kg)    GENERAL:alert, no distress and comfortable SKIN: skin color, texture, turgor are normal, no rashes or  significant lesions EYES: normal, Conjunctiva are pink and non-injected, sclera clear OROPHARYNX:no exudate, no erythema and lips, buccal mucosa, and tongue normal  NECK: tracheostomy site looks clean.  LYMPH:  Bilateral palpable lymphadenopathy in the cervical region LUNGS: clear to auscultation and percussion with normal breathing effort HEART: regular rate & rhythm and no murmurs and no lower extremity edema ABDOMEN:abdomen soft, non-tender and normal bowel sounds. Feeding tube site looks okay Musculoskeletal:no cyanosis of digits and no clubbing  NEURO: alert & oriented x 3 with fluent speech, no focal motor/sensory deficits  LABORATORY DATA:  I have reviewed the data as listed    Component Value Date/Time   NA 136 03/13/2015 0852   NA 135 02/14/2015 1439   K 4.2 03/13/2015 0852   K 4.2 02/14/2015 1439   CL 100* 02/14/2015 1439   CO2 25 03/13/2015 0852   CO2 24 02/14/2015 1439   GLUCOSE 98 03/13/2015 0852   GLUCOSE 81 02/14/2015 1439   BUN 9.6 03/13/2015 0852   BUN <5* 02/14/2015 1439   CREATININE 0.6* 03/13/2015 0852   CREATININE 0.65 02/22/2015 1731   CALCIUM 9.5 03/13/2015 0852   CALCIUM 8.5* 02/14/2015 1439   PROT 7.2 03/13/2015 0852   PROT 7.7 02/14/2015 1439   ALBUMIN 2.6* 03/13/2015 0852   ALBUMIN 3.3* 02/14/2015 1439   AST 51* 03/13/2015 0852   AST 56* 02/14/2015 1439   ALT 60* 03/13/2015 0852   ALT 27 02/14/2015 1439   ALKPHOS 111 03/13/2015 0852   ALKPHOS 113 02/14/2015 1439   BILITOT 0.46 03/13/2015 0852   BILITOT 0.4 02/14/2015 1439   GFRNONAA >60 02/22/2015 1731   GFRAA >60 02/22/2015 1731    No results found for: SPEP, UPEP  Lab Results  Component Value Date   WBC 8.5 03/13/2015   NEUTROABS 5.7 03/13/2015   HGB 11.3* 03/13/2015   HCT 34.1* 03/13/2015   MCV 97.2 03/13/2015   PLT 672* 03/13/2015      Chemistry      Component Value Date/Time   NA 136 03/13/2015 0852   NA 135 02/14/2015 1439   K 4.2 03/13/2015 0852   K 4.2 02/14/2015 1439    CL 100* 02/14/2015 1439   CO2 25 03/13/2015 0852   CO2 24 02/14/2015 1439   BUN 9.6 03/13/2015 0852   BUN <5* 02/14/2015 1439   CREATININE 0.6* 03/13/2015 0852   CREATININE 0.65 02/22/2015 1731      Component Value Date/Time   CALCIUM 9.5 03/13/2015 0852   CALCIUM 8.5* 02/14/2015 1439   ALKPHOS 111 03/13/2015 0852   ALKPHOS 113 02/14/2015 1439   AST 51* 03/13/2015 0852   AST 56* 02/14/2015 1439   ALT 60* 03/13/2015 0852   ALT 27 02/14/2015 1439   BILITOT 0.46 03/13/2015 0852   BILITOT 0.4 02/14/2015 1439       RADIOGRAPHIC STUDIES: I reviewed the PET CT scan with him I have personally reviewed the radiological images as listed and agreed with the findings in the report.   ASSESSMENT & PLAN:  Carcinoma of supraglottis We discussed the role of chemotherapy. The intent is for cure.  We discussed some of the risks, benefits, side-effects of cisplatin. Some of the short term side-effects included, though not limited to, including weight loss, life threatening  infections, risk of allergic reactions, need for transfusions of blood products, nausea, vomiting, change in bowel habits, loss of hair, admission to hospital for various reasons, and risks of death.   Long term side-effects are also discussed including risks of infertility, permanent damage to nerve function, hearing loss, chronic fatigue, kidney damage with possibility needing hemodialysis, and rare secondary malignancy including bone marrow disorders.  The patient is aware that the response rates discussed earlier is not guaranteed.  After a long discussion, patient made an informed decision to proceed with the prescribed plan of care and went ahead to sign the consent form today.   Patient education material was dispensed. I will order port placement and chemotherapy education class. We will plan to start treatment next week.  Anxiety disorder The patient have history of anxiety disorder without  depression. Recommend starting him on Celexa for anxiety and as a mood stabilizer  Protein-calorie malnutrition, severe He has lost a lot of weight due to dysphagia. We will get him follow with speech and language therapist and follow up with nutritionist. He is currently dependent on tube feeds long-term but is able to have some pureed diet  S/P gastrostomy His feeding tube site looks clean. He is dependent on feeding tube for nutritional needs. I am concerned about high risk of dehydration while on cisplatin. I will see him on a weekly basis but recommend we start IV fluids the week after start date to prevent risk of dehydration.    Orders Placed This Encounter  Procedures  . Comprehensive metabolic panel (Cmet) - [facility]    Standing Status: Standing     Number of Occurrences: 22     Standing Expiration Date: 03/11/2016  . CBC with Differential    Standing Status: Standing     Number of Occurrences: 20     Standing Expiration Date: 03/12/2016   All questions were answered. The patient knows to call the clinic with any problems, questions or concerns. No barriers to learning was detected. I spent 40 minutes counseling the patient face to face. The total time spent in the appointment was 60 minutes and more than 50% was on counseling and review of test results     [facility], [place], MD 03/13/2015 4:39 PM

## 2015-03-13 NOTE — Progress Notes (Signed)
39 year old male diagnosed with cancer of the larynx.  He is a patient of Dr. Isidore Moos. Patient currently resides at [facility].  Past medical history includes alcohol abuse, COPD, anxiety, GERD, seizures, and four-month history of dysphasia, multiple tooth extractions.  Medications multivitamin, Folvite, and thiamine.  Labs include albumin 2.6 on July 26.  Height: 6 feet 1 inch. Weight: 132.9 pounds July 25.  Patient weighed 141 pounds 02/14/2015. Usual body weight: 150 pounds. BMI: 17.54. (Underweight)  Patient reports he consumes a pured, pudding thick liquid diet at [place]. Patient also receives Jevity 1.2 via PEG 3 times a day.  Patient denies any problems with tube feeding. Patient states his appetite is good.  Estimated nutrition needs: 1800-2100 calories, 90-102 g protein, 2.2 L fluid.  Nutrition diagnosis: Unintended weight loss related to inadequate oral intake as evidenced by 11% weight loss from usual body weight.  Intervention:  Educated patient to continue dysphasia I, pudding thick liquid diet as tolerated. Educated patient on high-protein foods sources. Encouraged patient to bring appropriate thicken liquids and foods with him during chemotherapy. Recommended patient continue Jevity 1.2 - 3 times a day providing an additional 855 cal, and 40 g protein. Provided contact information.  Questions were answered.  Teach back information was used.  Monitoring, evaluation, goals: Patient will tolerate oral diet plus bolus tube feedings to promote weight gain, and repletion of lean body mass.  Next visit: Wednesday, August 3, during infusion.  **Disclaimer: This note was dictated with voice recognition software. Similar sounding words can inadvertently be transcribed and this note may contain transcription errors which may not have been corrected upon publication of note.**

## 2015-03-15 ENCOUNTER — Other Ambulatory Visit: Payer: Self-pay | Admitting: Physician Assistant

## 2015-03-15 LAB — CBC AND DIFFERENTIAL
HEMATOCRIT: 33 % — AB (ref 41–53)
HEMOGLOBIN: 11 g/dL — AB (ref 13.5–17.5)
PLATELETS: 557 10*3/uL — AB (ref 150–399)
WBC: 5.9 10*3/mL

## 2015-03-15 LAB — BASIC METABOLIC PANEL
BUN: 9 mg/dL (ref 4–21)
Creatinine: 0.5 mg/dL — AB (ref 0.6–1.3)
Glucose: 86 mg/dL
Potassium: 4.4 mmol/L (ref 3.4–5.3)
Sodium: 137 mmol/L (ref 137–147)

## 2015-03-16 ENCOUNTER — Other Ambulatory Visit: Payer: Self-pay | Admitting: Hematology and Oncology

## 2015-03-16 ENCOUNTER — Ambulatory Visit (HOSPITAL_COMMUNITY)
Admission: RE | Admit: 2015-03-16 | Discharge: 2015-03-16 | Disposition: A | Discharge status: Home / Self Care | Payer: Medicaid Other | Source: Ambulatory Visit

## 2015-03-16 ENCOUNTER — Encounter (HOSPITAL_COMMUNITY): Payer: Self-pay

## 2015-03-16 ENCOUNTER — Ambulatory Visit (HOSPITAL_COMMUNITY)
Admission: RE | Admit: 2015-03-16 | Discharge: 2015-03-16 | Disposition: A | Discharge status: Home / Self Care | Payer: Medicaid Other | Source: Ambulatory Visit | Attending: Hematology and Oncology | Admitting: Hematology and Oncology

## 2015-03-16 ENCOUNTER — Telehealth: Payer: Self-pay | Admitting: *Deleted

## 2015-03-16 DIAGNOSIS — C321 Malignant neoplasm of supraglottis: Secondary | ICD-10-CM

## 2015-03-16 DIAGNOSIS — C760 Malignant neoplasm of head, face and neck: Secondary | ICD-10-CM | POA: Diagnosis present

## 2015-03-16 LAB — APTT: APTT: 31 s (ref 24–37)

## 2015-03-16 LAB — PROTIME-INR
INR: 1.18 (ref 0.00–1.49)
Prothrombin Time: 15.2 seconds (ref 11.6–15.2)

## 2015-03-16 MED ORDER — CEFAZOLIN SODIUM-DEXTROSE 2-3 GM-% IV SOLR
2.0000 g | INTRAVENOUS | Status: AC
  Administered 2015-03-16: 2 g via INTRAVENOUS

## 2015-03-16 MED ORDER — HEPARIN SOD (PORK) LOCK FLUSH 100 UNIT/ML IV SOLN
INTRAVENOUS | Status: AC | PRN
  Administered 2015-03-16: 500 [IU]

## 2015-03-16 MED ORDER — FENTANYL CITRATE (PF) 100 MCG/2ML IJ SOLN
INTRAMUSCULAR | Status: AC | PRN
  Administered 2015-03-16 (×3): 25 ug via INTRAVENOUS

## 2015-03-16 MED ORDER — LIDOCAINE-EPINEPHRINE 2 %-1:100000 IJ SOLN
INTRAMUSCULAR | Status: AC
  Filled 2015-03-16: qty 1

## 2015-03-16 MED ORDER — FENTANYL CITRATE (PF) 100 MCG/2ML IJ SOLN
INTRAMUSCULAR | Status: AC
  Filled 2015-03-16: qty 2

## 2015-03-16 MED ORDER — SODIUM CHLORIDE 0.9 % IV SOLN
INTRAVENOUS | Status: DC
  Administered 2015-03-16: 11:00:00 via INTRAVENOUS

## 2015-03-16 MED ORDER — CEFAZOLIN SODIUM-DEXTROSE 2-3 GM-% IV SOLR
INTRAVENOUS | Status: AC
  Filled 2015-03-16: qty 50

## 2015-03-16 MED ORDER — MIDAZOLAM HCL 2 MG/2ML IJ SOLN
INTRAMUSCULAR | Status: AC | PRN
  Administered 2015-03-16 (×3): 0.5 mg via INTRAVENOUS
  Administered 2015-03-16: 1 mg via INTRAVENOUS

## 2015-03-16 MED ORDER — MIDAZOLAM HCL 2 MG/2ML IJ SOLN
INTRAMUSCULAR | Status: AC
  Filled 2015-03-16: qty 4

## 2015-03-16 MED ORDER — HEPARIN SOD (PORK) LOCK FLUSH 100 UNIT/ML IV SOLN
INTRAVENOUS | Status: AC
  Filled 2015-03-16: qty 5

## 2015-03-16 NOTE — Telephone Encounter (Signed)
Oncology Nurse Navigator Documentation   Navigator Encounter Type: Telephone (03/16/15 1145)     [facility] Short Stay, spoke with patient's RN Manuela Schwartz, provided phone # for Debra at [facility] when patient ready to be transported back to [facility] by [place].  She verbalized understanding.  [name], RN, BSN, [place] at [place] [phone]

## 2015-03-16 NOTE — Discharge Instructions (Signed)
Implanted Port Insertion, Care After °Refer to this sheet in the next few weeks. These instructions provide you with information on caring for yourself after your procedure. Your health care provider may also give you more specific instructions. Your treatment has been planned according to current medical practices, but problems sometimes occur. Call your health care provider if you have any problems or questions after your procedure. °WHAT TO EXPECT AFTER THE PROCEDURE °After your procedure, it is typical to have the following:  °· Discomfort at the port insertion site. Ice packs to the area will help. °· Bruising on the skin over the port. This will subside in 3-4 days. °HOME CARE INSTRUCTIONS °· After your port is placed, you will get a manufacturer's information card. The card has information about your port. Keep this card with you at all times.   °· Know what kind of port you have. There are many types of ports available.   °· Wear a medical alert bracelet in case of an emergency. This can help alert health care workers that you have a port.   °· The port can stay in for as long as your health care provider believes it is necessary.   °· A home health care nurse may give medicines and take care of the port.   °· You or a family member can get special training and directions for giving medicine and taking care of the port at home.   °SEEK MEDICAL CARE IF:  °· Your port does not flush or you are unable to get a blood return.   °· You have a fever or chills. °SEEK IMMEDIATE MEDICAL CARE IF: °· You have new fluid or pus coming from your incision.   °· You notice a bad smell coming from your incision site.   °· You have swelling, pain, or more redness at the incision or port site.   °· You have chest pain or shortness of breath. °Document Released: 05/25/2013 Document Revised: 08/09/2013 Document Reviewed: 05/25/2013 °ExitCare® Patient Information ©2015 [employer]. This information is not intended to replace  advice given to you by your health care provider. Make sure you discuss any questions you have with your health care provider. °Implanted Port Home Guide °An implanted port is a type of central line that is placed under the skin. Central lines are used to provide IV access when treatment or nutrition needs to be given through a person's veins. Implanted ports are used for long-term IV access. An implanted port may be placed because:  °· You need IV medicine that would be irritating to the small veins in your hands or arms.   °· You need long-term IV medicines, such as antibiotics.   °· You need IV nutrition for a long period.   °· You need frequent blood draws for lab tests.   °· You need dialysis.   °Implanted ports are usually placed in the chest area, but they can also be placed in the upper arm, the abdomen, or the leg. An implanted port has two main parts:  °· Reservoir. The reservoir is round and will appear as a small, raised area under your skin. The reservoir is the part where a needle is inserted to give medicines or draw blood.   °· Catheter. The catheter is a thin, flexible tube that extends from the reservoir. The catheter is placed into a large vein. Medicine that is inserted into the reservoir goes into the catheter and then into the vein.   °HOW WILL I CARE FOR MY INCISION SITE? °Do not get the incision site wet. Bathe or   shower as directed by your health care provider.  °HOW IS MY PORT ACCESSED? °Special steps must be taken to access the port:  °· Before the port is accessed, a numbing cream can be placed on the skin. This helps numb the skin over the port site.   °· Your health care provider uses a sterile technique to access the port. °· Your health care provider must put on a mask and sterile gloves. °· The skin over your port is cleaned carefully with an antiseptic and allowed to dry. °· The port is gently pinched between sterile gloves, and a needle is inserted into the port. °· Only  "non-coring" port needles should be used to access the port. Once the port is accessed, a blood return should be checked. This helps ensure that the port is in the vein and is not clogged.   °· If your port needs to remain accessed for a constant infusion, a clear (transparent) bandage will be placed over the needle site. The bandage and needle will need to be changed every week, or as directed by your health care provider.   °· Keep the bandage covering the needle clean and dry. Do not get it wet. Follow your health care provider's instructions on how to take a shower or bath while the port is accessed.   °· If your port does not need to stay accessed, no bandage is needed over the port.   °WHAT IS FLUSHING? °Flushing helps keep the port from getting clogged. Follow your health care provider's instructions on how and when to flush the port. Ports are usually flushed with saline solution or a medicine called heparin. The need for flushing will depend on how the port is used.  °· If the port is used for intermittent medicines or blood draws, the port will need to be flushed:   °· After medicines have been given.   °· After blood has been drawn.   °· As part of routine maintenance.   °· If a constant infusion is running, the port may not need to be flushed.   °HOW LONG WILL MY PORT STAY IMPLANTED? °The port can stay in for as long as your health care provider thinks it is needed. When it is time for the port to come out, surgery will be done to remove it. The procedure is similar to the one performed when the port was put in.  °WHEN SHOULD I SEEK IMMEDIATE MEDICAL CARE? °When you have an implanted port, you should seek immediate medical care if:  °· You notice a bad smell coming from the incision site.   °· You have swelling, redness, or drainage at the incision site.   °· You have more swelling or pain at the port site or the surrounding area.   °· You have a fever that is not controlled with medicine. °Document  Released: 08/04/2005 Document Revised: 05/25/2013 Document Reviewed: 04/11/2013 °ExitCare® Patient Information ©2015 [employer]. This information is not intended to replace advice given to you by your health care provider. Make sure you discuss any questions you have with your health care provider.Conscious Sedation, Adult, Care After °Refer to this sheet in the next few weeks. These instructions provide you with information on caring for yourself after your procedure. Your health care provider may also give you more specific instructions. Your treatment has been planned according to current medical practices, but problems sometimes occur. Call your health care provider if you have any problems or questions after your procedure. °WHAT TO EXPECT AFTER THE PROCEDURE  °After your procedure: °·   You may feel sleepy, clumsy, and have poor balance for several hours. °· Vomiting may occur if you eat too soon after the procedure. °HOME CARE INSTRUCTIONS °· Do not participate in any activities where you could become injured for at least 24 hours. Do not: °¨ Drive. °¨ Swim. °¨ Ride a bicycle. °¨ Operate heavy machinery. °¨ Cook. °¨ Use power tools. °¨ Climb ladders. °¨ Work from a high place. °· Do not make important decisions or sign legal documents until you are improved. °· If you vomit, drink water, juice, or soup when you can drink without vomiting. Make sure you have little or no nausea before eating solid foods. °· Only take over-the-counter or prescription medicines for pain, discomfort, or fever as directed by your health care provider. °· Make sure you and your family fully understand everything about the medicines given to you, including what side effects may occur. °· You should not drink alcohol, take sleeping pills, or take medicines that cause drowsiness for at least 24 hours. °· If you smoke, do not smoke without supervision. °· If you are feeling better, you may resume normal activities 24 hours after you  were sedated. °· Keep all appointments with your health care provider. °SEEK MEDICAL CARE IF: °· Your skin is pale or bluish in color. °· You continue to feel nauseous or vomit. °· Your pain is getting worse and is not helped by medicine. °· You have bleeding or swelling. °· You are still sleepy or feeling clumsy after 24 hours. °SEEK IMMEDIATE MEDICAL CARE IF: °· You develop a rash. °· You have difficulty breathing. °· You develop any type of allergic problem. °· You have a fever. °MAKE SURE YOU: °· Understand these instructions. °· Will watch your condition. °· Will get help right away if you are not doing well or get worse. °Document Released: 05/25/2013 Document Reviewed: 05/25/2013 °ExitCare® Patient Information ©2015 [employer]. This information is not intended to replace advice given to you by your health care provider. Make sure you discuss any questions you have with your health care provider. ° °

## 2015-03-16 NOTE — Progress Notes (Signed)
Attempted to call [facility] facility x 4 was unsuccessful.  I was put on hold each time by operator then no one would ever pick up.  I have sent D/C(written instructions) papers with Port a cath placement information with pt.  [facility] arrived at 1430 and transported pt back to [place].

## 2015-03-16 NOTE — Progress Notes (Signed)
Patient ID: Marvin [name], male   DOB: 1976/02/27, 39 y.o.   MRN: [id number]    Referring Physician(s): Gorsuch,Ni  Chief Complaint: Supraglottic cancer   Subjective: Pt familiar to IR service from recent gastrostomy tube placement 02/28/15. He has hx of supraglottic carcinoma and presents today for port a cath placement for chemotherapy. Pt is currently asymptomatic.    Allergies: Review of patient's allergies indicates no known allergies.  Medications: Prior to Admission medications   Medication Sig Start Date End Date Taking? Authorizing Provider  amoxicillin-clavulanate (AUGMENTIN) 875-125 MG per tablet Take 1 tablet by mouth 2 (two) times daily. STARTED 10 DAY SUPPLY ON 03/09/2015   Yes Historical Provider, MD  bisacodyl (DULCOLAX) 10 MG suppository Place 10 mg rectally as needed for moderate constipation.   Yes Historical Provider, MD  citalopram (CELEXA) 20 MG tablet 20 mg by PEG Tube route daily.   Yes Historical Provider, MD  feeding supplement, ENSURE, (ENSURE) PUDG Take 1 Container by mouth 3 (three) times daily between meals.   Yes Historical Provider, MD  folic acid (FOLVITE) 1 MG tablet Take 1 tablet (1 mg total) by mouth daily. 03/08/15  Yes [name], MD  HYDROcodone-acetaminophen (HYCET) 7.5-325 mg/15 ml solution Take 15 mLs by mouth 4 (four) times daily as needed for moderate pain. 03/08/15  Yes [name], MD  ibuprofen (ADVIL,MOTRIN) 100 MG/5ML suspension Take 200 mg by mouth every 4 (four) hours as needed for moderate pain.   Yes Historical Provider, MD  ibuprofen (ADVIL,MOTRIN) 200 MG tablet Take 1-2 tablets (200-400 mg total) by mouth every 4 (four) hours as needed for fever or moderate pain. 03/08/15  Yes [name], MD  lidocaine-prilocaine (EMLA) cream Apply 1 application topically as needed (FOR PORT ACCESS).   Yes Historical Provider, MD  LORazepam (ATIVAN) 1 MG tablet Take 0.5-1 mg by mouth 3 (three) times daily. TAKE 1 MG FOR 7 DAYS AND WILL CHANGE TO  1/2 TABLET (0.5MG ) ON 03/20/2015   Yes Historical Provider, MD  magnesium hydroxide (MILK OF MAGNESIA) 400 MG/5ML suspension Take 30 mLs by mouth daily as needed for mild constipation.   Yes Historical Provider, MD  Multiple Vitamin (MULTIVITAMIN WITH MINERALS) TABS tablet Take 1 tablet by mouth daily. 03/08/15  Yes [name], MD  nicotine (NICODERM CQ - DOSED IN MG/24 HOURS) 21 mg/24hr patch Place 1 patch (21 mg total) onto the skin daily. 03/08/15  Yes [name], MD  Nutritional Supplements (FEEDING SUPPLEMENT, JEVITY 1.5 CAL,) LIQD Place 237 mLs into feeding tube 3 (three) times daily.   Yes Historical Provider, MD  omeprazole (PRILOSEC) 20 MG capsule 20 mg by PEG Tube route daily.   Yes Historical Provider, MD  ondansetron (ZOFRAN) 8 MG tablet 8 mg by PEG Tube route every 8 (eight) hours as needed for nausea or vomiting.   Yes Historical Provider, MD  pravastatin (PRAVACHOL) 20 MG tablet 20 mg by PEG Tube route daily.   Yes Historical Provider, MD  prochlorperazine (COMPAZINE) 10 MG tablet 10 mg by PEG Tube route every 6 (six) hours as needed for nausea or vomiting.   Yes Historical Provider, MD  thiamine 100 MG tablet Take 1 tablet (100 mg total) by mouth daily. 03/08/15  Yes [name], MD     Vital Signs: BP 104/54  HR 106  R 16  TEMP 98.7  O2 SATS 95%  Physical Exam  Constitutional: He is oriented to person, place, and time.  Thin WM in NAD  Neck:  Trach in  place, site clean  Cardiovascular: Regular rhythm.   Sl tachy but regular  Pulmonary/Chest: Effort normal and breath sounds normal.  Abdominal: Soft. Bowel sounds are normal.  Clean, intact G tube  Musculoskeletal: Normal range of motion. He exhibits no edema.  Neurological: He is alert and oriented to person, place, and time.    Imaging: No results found.  Labs:  CBC:  Recent Labs  02/28/15 0333 03/05/15 0408 03/06/15 1030 03/13/15 0852  WBC 5.2 10.7* 10.7* 8.5  HGB 11.1* 10.4* 10.5* 11.3*  HCT 31.8* 30.8*  30.6* 34.1*  PLT 181 496* 518* 672*    COAGS:  Recent Labs  02/14/15 1439 02/28/15 0333  INR 1.06 1.15  APTT 35 35    BMP:  Recent Labs  01/21/15 2033 02/14/15 1439 02/22/15 1731 03/13/15 0852  NA 133* 135  --  136  K 3.6 4.2  --  4.2  CL 94* 100*  --   --   CO2  --  24  --  25  GLUCOSE 90 81  --  98  BUN <3* <5*  --  9.6  CALCIUM  --  8.5*  --  9.5  CREATININE 1.20 0.46* 0.65 0.6*  GFRNONAA  --  >60 >60  --   GFRAA  --  >60 >60  --     LIVER FUNCTION TESTS:  Recent Labs  02/14/15 1439 03/13/15 0852  BILITOT 0.4 0.46  AST 56* 51*  ALT 27 60*  ALKPHOS 113 111  PROT 7.7 7.2  ALBUMIN 3.3* 2.6*    Assessment and Plan: Pt familiar to IR service from recent gastrostomy tube placement 02/28/15. He has hx of supraglottic carcinoma and presents today for port a cath placement for chemotherapy. Pt is currently asymptomatic. Risks and benefits discussed with the patient including, but not limited to bleeding, infection, pneumothorax, or fibrin sheath development and need for additional procedures.All of the patient's questions were answered, patient is agreeable to proceed. Consent signed and in chart.     Signed: D. Rowe Robert 03/16/2015, 10:27 AM   I spent a total of 15 minutes in face to face in clinical consultation/evaluation, greater than 50% of which was counseling/coordinating care for port a cath placement

## 2015-03-16 NOTE — Progress Notes (Signed)
Patient received via [facility] transportation from [facility]. In short stay pre port placement in IR today. Called respiratory therapy and they came and set up humidified O2 to patient's trach (28% at 5L). Respiratory therapist suctioned patient. Patient resting comfortably at present. Call bell in reach. A+O x3.

## 2015-03-16 NOTE — Procedures (Signed)
Successful placement of right IJ approach port-a-cath with tip at the superior caval atrial junction. The catheter is ready for immediate use. No immediate post procedural complications.  Jay Soila Printup, MD Pager #: [phone]   

## 2015-03-20 ENCOUNTER — Other Ambulatory Visit: Payer: Self-pay | Admitting: *Deleted

## 2015-03-20 ENCOUNTER — Other Ambulatory Visit (HOSPITAL_BASED_OUTPATIENT_CLINIC_OR_DEPARTMENT_OTHER): Payer: Medicaid Other

## 2015-03-20 ENCOUNTER — Encounter: Payer: Self-pay | Admitting: Medical Oncology

## 2015-03-20 ENCOUNTER — Encounter (HOSPITAL_COMMUNITY): Payer: Self-pay | Admitting: Dentistry

## 2015-03-20 ENCOUNTER — Ambulatory Visit (HOSPITAL_COMMUNITY): Payer: Self-pay | Admitting: Dentistry

## 2015-03-20 ENCOUNTER — Telehealth: Payer: Self-pay | Admitting: Medical Oncology

## 2015-03-20 VITALS — BP 85/58 | HR 91 | Temp 98.6°F

## 2015-03-20 DIAGNOSIS — C321 Malignant neoplasm of supraglottis: Secondary | ICD-10-CM

## 2015-03-20 DIAGNOSIS — K036 Deposits [accretions] on teeth: Secondary | ICD-10-CM

## 2015-03-20 DIAGNOSIS — Z01818 Encounter for other preprocedural examination: Secondary | ICD-10-CM

## 2015-03-20 DIAGNOSIS — K08409 Partial loss of teeth, unspecified cause, unspecified class: Secondary | ICD-10-CM

## 2015-03-20 DIAGNOSIS — K0530 Chronic periodontitis, unspecified: Secondary | ICD-10-CM

## 2015-03-20 DIAGNOSIS — Z510 Encounter for antineoplastic radiation therapy: Secondary | ICD-10-CM | POA: Diagnosis not present

## 2015-03-20 DIAGNOSIS — K08109 Complete loss of teeth, unspecified cause, unspecified class: Secondary | ICD-10-CM

## 2015-03-20 DIAGNOSIS — T148XXD Other injury of unspecified body region, subsequent encounter: Secondary | ICD-10-CM

## 2015-03-20 LAB — CBC WITH DIFFERENTIAL/PLATELET
BASO%: 0.7 % (ref 0.0–2.0)
BASOS ABS: 0.1 10*3/uL (ref 0.0–0.1)
EOS%: 1.7 % (ref 0.0–7.0)
Eosinophils Absolute: 0.2 10*3/uL (ref 0.0–0.5)
HEMATOCRIT: 36 % — AB (ref 38.4–49.9)
HGB: 12.1 g/dL — ABNORMAL LOW (ref 13.0–17.1)
LYMPH%: 20.4 % (ref 14.0–49.0)
MCH: 32.6 pg (ref 27.2–33.4)
MCHC: 33.6 g/dL (ref 32.0–36.0)
MCV: 97 fL (ref 79.3–98.0)
MONO#: 0.9 10*3/uL (ref 0.1–0.9)
MONO%: 10.4 % (ref 0.0–14.0)
NEUT#: 5.9 10*3/uL (ref 1.5–6.5)
NEUT%: 66.8 % (ref 39.0–75.0)
PLATELETS: 377 10*3/uL (ref 140–400)
RBC: 3.72 10*6/uL — ABNORMAL LOW (ref 4.20–5.82)
RDW: 13.7 % (ref 11.0–14.6)
WBC: 8.8 10*3/uL (ref 4.0–10.3)
lymph#: 1.8 10*3/uL (ref 0.9–3.3)

## 2015-03-20 LAB — COMPREHENSIVE METABOLIC PANEL (CC13)
ALBUMIN: 3 g/dL — AB (ref 3.5–5.0)
ALT: 25 U/L (ref 0–55)
ANION GAP: 7 meq/L (ref 3–11)
AST: 23 U/L (ref 5–34)
Alkaline Phosphatase: 100 U/L (ref 40–150)
BUN: 9.5 mg/dL (ref 7.0–26.0)
CHLORIDE: 105 meq/L (ref 98–109)
CO2: 26 meq/L (ref 22–29)
Calcium: 9.5 mg/dL (ref 8.4–10.4)
Creatinine: 0.6 mg/dL — ABNORMAL LOW (ref 0.7–1.3)
EGFR: >90 mL/min/{1.73_m2} (ref >90)
Glucose: 95 mg/dl (ref 70–140)
POTASSIUM: 4.1 meq/L (ref 3.5–5.1)
Sodium: 138 mEq/L (ref 136–145)
Total Bilirubin: 0.5 mg/dL (ref 0.20–1.20)
Total Protein: 7.1 g/dL (ref 6.4–8.3)

## 2015-03-20 NOTE — Progress Notes (Signed)
Oncology Nurse Navigator Documentation  Oncology Nurse Navigator Flowsheets 03/13/2015 03/16/2015 03/20/2015  Referral date to RadOnc/MedOnc - - -  Navigator Encounter Type Other Telephone Other- Dental Clinic/Lab I met Mr. Coia this morning in the dental clinic and introduced myself to him. I informed him that Liliane Channel is out of the office this week and I will be following him. I gave him my card with my phone number and I encouraged him to call me with any questions or concerns. He voiced understanding. He also had labs drawn today in preparation for chemotherapy tomorrow. I asked him if he has Emla cream to apply to his port. He states he has a cream but not sure what it is for. I reviewed his medication list and Emla is listed.. I discussed with him how to apply the cream and when. He states that he is getting stronger and now able to walk and is eating much better. He is not sure why he continues to be transported by [employer]. The attendants expressed that Mr. Taite is embarrassed now that he is feeling better. I will call Helene Kelp today to see if patient could possibly be brought by their Lucianne Lei. He states he would appreciate this.   Patient Visit Type - - Dental Clinic/Lab  Treatment Phase - - Other  Barriers/Navigation Needs - - Transportation  Interventions Coordination of Care Coordination of Care Coordination of Care  Referrals - - -  Time Spent with Patient [phone]

## 2015-03-20 NOTE — Progress Notes (Signed)
POST OPERATIVE NOTE:  03/20/2015   REI MEDLEN [id number]  VITALS: BP 85/58 mmHg  Pulse 91  Temp(Src) 98.6 F (37 C) (Oral)   LABS:  Lab Results  Component Value Date   WBC 8.5 03/13/2015   HGB 11.3* 03/13/2015   HCT 34.1* 03/13/2015   MCV 97.2 03/13/2015   PLT 672* 03/13/2015   BMET    Component Value Date/Time   NA 136 03/13/2015 0852   NA 135 02/14/2015 1439   K 4.2 03/13/2015 0852   K 4.2 02/14/2015 1439   CL 100* 02/14/2015 1439   CO2 25 03/13/2015 0852   CO2 24 02/14/2015 1439   GLUCOSE 98 03/13/2015 0852   GLUCOSE 81 02/14/2015 1439   BUN 9.6 03/13/2015 0852   BUN <5* 02/14/2015 1439   CREATININE 0.6* 03/13/2015 0852   CREATININE 0.65 02/22/2015 1731   CALCIUM 9.5 03/13/2015 0852   CALCIUM 8.5* 02/14/2015 1439   GFRNONAA >60 02/22/2015 1731   GFRAA >60 02/22/2015 1731    Lab Results  Component Value Date   INR 1.18 03/16/2015   INR 1.15 02/28/2015   INR 1.06 02/14/2015   No results found for: PTT   [name] is status post multiple extractions with alveoloplasty, pre-prosthetic surgery, and gross debridement of remaining dentition in the operating room with general anesthesia on 03/05/2015. Patient now presents for reevaluation of healing and suture removal.  SUBJECTIVE: Patient denies having any problems with dental extraction sites. Patient denies having any oral dental pain.  EXAM: There is no sign of heme or ooze. Sutures are loosely intact. There is some delayed healing involving the mandibular right lingual aspect in the area of #32. Area is healing in by secondary intention with generalized soft tissue covering. This area will be followed for continued healing in 2-3 weeks.  PROCEDURE: The patient was given a chlorhexidine gluconate rinse for 30 seconds. Sutures were then removed without complication. Patient tolerated the procedure well.  ASSESSMENT: Post operative course is consistent with dental procedures performed in  the OR. Delayed healing involving the mandibular right lingual aspect.  PLAN: 1. Continue salt water rinses as needed to aid healing. 2. Brush teeth after meals and at bedtime. Floss at bedtime. 3. Return to clinic as scheduled for oral examination during radiation therapy and for reevaluation of healing of mandibular right lingual aspect.   Lenn Cal, DDS

## 2015-03-20 NOTE — Patient Instructions (Addendum)
PLAN: 1. Continue salt water rinses as needed to aid healing. 2. Brush teeth after meals and at bedtime. Floss at bedtime. 3. Return to clinic as scheduled for oral examination during radiation therapy and for reevaluation of healing of mandibular right lingual aspect.   Lenn Cal, DDS    RADIATION THERAPY AND DECISIONS REGARDING YOUR TEETH  Xerostomia (dry mouth) Your salivary glands may be in the filed of radiation.  Radiation may include all or part of your saliva glands.  This will cause your saliva to dry up and you will have a dry mouth.  The dry mouth will be for the rest of your life unless your radiation oncologist tells you otherwise.  Your saliva has many functions:  Saliva wets your tongue for speaking.  It coats your teeth and the inside of your mouth for easier movement.  It helps with chewing and swallowing food.  It helps clean away harmful acid and toxic products made by the germs in your mouth, therefore it helps prevent cavities.  It kills some germs in your mouth and helps to prevent gum disease.  It helps to carry flavor to your taste buds.  Once you have lost your saliva you will be at higher risk for tooth decay and gum disease.  What can be done to help improve your mouth when there's not enough saliva:  1.  Your dentist may give a prescription for Salagen.  It will not bring back all of your saliva but may bring back some of it.  Also your saliva may be thick and ropy or white and foamy. It will not feel like it use to feel.  2.  You will need to swish with water every time your mouth feels dry.  YOU CANNOT suck on any cough drops, mints, lemon drops, candy, vitamin C or any other products.  You cannot use anything other than water to make your mouth feel less dry.  If you want to drink anything else you have to drink it all at once and brush afterwards.  Be sure to discuss the details of your diet habits with your dentist or hygienist.  Radiation  caries: This is decay that happens very quickly once your mouth is very dry due to radiation therapy.  Normally cavities take six months to two years to become a problem.  When you have dry mouth cavities may take as little as eight weeks to cause you a problem.  This is why dental check ups every two months are necessary as long as you have a dry mouth. Radiation caries typically, but not always, start at your gum line where it is hard to see the cavity.  It is therefore also hard to fill these cavities adequately.  This high rate of cavities happens because your mouth no longer has saliva and therefore the acid made by the germs starts the decay process.  Whenever you eat anything the germs in your mouth change the food into acid.  The acid then burns a small hole in your tooth.  This small hole is the beginning of a cavity.  If this is not treated then it will grow bigger and become a cavity.  The way to avoid this hole getting bigger is to use fluoride every evening as prescribed by your dentist.  You have to make sure that your teeth are very clean before you use the fluoride.  This fluoride in turn will strengthen your teeth and prepare them for another  day of fighting acid.  If you develop radiation caries many times the damage is so large that you will have to have all your teeth removed.  This could be a big problem if some of these teeth are in the field of radiation.  Further details of why this could be a big problem will follow.  (See Osteoradionecrosis).  Loss of taste (dysgeusia) This happens to varying degrees once you've had radiation therapy to your jaw region.  Many times taste is not completely lost but becomes limited.  The loss of taste is mostly due to radiation affecting your taste buds.  However if you have no saliva in your mouth to carry the flavor to your taste buds it would be difficult for your taste buds to taste anything.  That is why using water or a prescription for Salagen  prior to meals and during meals may help with some of the taste.  Keep in mind that taste generally returns very slowly over the course of several months or several years after radiation therapy.  Don't give up hope.  Trismus According to your Radiation Oncologist your TMJ or jaw joints are going to be partially or fully in the field of radiation.  This means that over time the muscles that help you open and close your mouth may get stiff.  This will potentially result in your not being able to open your mouth wide enough or as wide as you can open it now.  Le me give you an example of how slowly this happens and how unaware people are of it.  A gentlemen that had radiation therapy two years ago came back to me complaining that bananas are just too large for him to be able to fit them in between his teeth.  He was not able to open wide enough to bite into a banana.  This happens slowly and over a period of time.  What do we do to try and prevent this?  Your dentist will probably give you a stack of sticks called a trismus exercise device .  This stack will help your remind your muscles and your jaw joint to open up to the same distance every day.  Use these sticks every morning when you wake up according to the instructions given by the dentist.   You must use these sticks for at least one to two years after radiation therapy.  The reason for that is because it happens so slowly and keeps going on for about two years after radiation therapy.  Your hospital dentist will help you monitor your mouth opening and make sure that it's not getting smaller.  Osteoradionecrosis (ORN) This is a condition where your jaw bone after having had radiation therapy becomes very dry.  It has very little blood supply to keep it alive.  If you develop a cavity that turns into an abscess or an infection then the jaw bone does not have enough blood supply to help fight the infection.  At this point it is very likely that the  infection could cause the death of your jaw bone.  When you have dead bone it has to be removed.  Therefore you might end up having to have surgery to remove part of your jaw bone, the part of the jaw bone that has been affected.   Healing is also a problem if you are to have surgery in the areas where the bone has had radiation therapy.  The same reasons apply.  If you have surgery you need more blood supply which is not available.  When blood supply and oxygen are not available again, there is a chance for the bone to die.  Occasionally ORN happens on its own with no obvious reason.  This is quite rare.  We believe that patients who continue to smoke and/or drink alcohol have a higher chance of having this bone problem.  Therefore once your jaw bone has had radiation therapy if there are any teeth in that area, you should never have them pulled.  You should also never have any surgery on your teeth or gums in that area unless the oral surgeon or Periodontist is aware of your history of radiation. There is some expensive management techniques that might be used to limit your risks.  The risks for ORN either from infection or spontaneous ( or on it's own) are life long.

## 2015-03-20 NOTE — Telephone Encounter (Signed)
Oncology Nurse Navigator Documentation  Oncology Nurse Navigator Flowsheets 03/16/2015 03/20/2015 03/20/2015  Referral date to RadOnc/MedOnc - - -  Navigator Encounter Type Telephone Other Telephone- I called Marvin [name] at [facility] to discuss pt's transportation. I explained to Marvin [name] that Marvin [name] no longer has oxygen and he  is able to ambulate on his own. He states he is feeling much stronger everyday and feels he is able to ride the [place]. I discussed that he feels embarrassed coming by [facility] now that he is stronger. She states she can arrange for him to ride the [place] and he should need [facility] in the future she can arrange. Marvin [name] states she speak with Marvin [name] and inform him she is arranging his transportation via [place]. I asked her to call me if there are any concerns or questions.   Patient Visit Type - Medonc -  Treatment Phase - Other -  Barriers/Navigation Needs - Transportation Transportation  Interventions Coordination of Care Coordination of Care Coordination of Care  Referrals - - -  Time Spent with Patient 15 30 15

## 2015-03-21 ENCOUNTER — Ambulatory Visit: Payer: Self-pay | Admitting: Radiation Oncology

## 2015-03-21 ENCOUNTER — Ambulatory Visit
Admission: RE | Admit: 2015-03-21 | Discharge: 2015-03-21 | Disposition: A | Discharge status: Home / Self Care | Payer: Medicaid Other | Source: Ambulatory Visit | Attending: Radiation Oncology | Admitting: Radiation Oncology

## 2015-03-21 ENCOUNTER — Ambulatory Visit
Admission: RE | Admit: 2015-03-21 | Discharge: 2015-03-21 | Disposition: A | Discharge status: Home / Self Care | Payer: Self-pay | Source: Ambulatory Visit | Attending: Radiation Oncology | Admitting: Radiation Oncology

## 2015-03-21 ENCOUNTER — Encounter: Payer: Self-pay | Admitting: Medical Oncology

## 2015-03-21 ENCOUNTER — Telehealth: Payer: Self-pay | Admitting: Medical Oncology

## 2015-03-21 ENCOUNTER — Encounter: Payer: Self-pay | Admitting: Radiation Oncology

## 2015-03-21 ENCOUNTER — Ambulatory Visit (HOSPITAL_BASED_OUTPATIENT_CLINIC_OR_DEPARTMENT_OTHER): Payer: Medicaid Other

## 2015-03-21 ENCOUNTER — Ambulatory Visit: Payer: Self-pay | Admitting: Nutrition

## 2015-03-21 ENCOUNTER — Other Ambulatory Visit: Payer: Self-pay | Admitting: Hematology and Oncology

## 2015-03-21 VITALS — BP 95/62 | HR 90 | Temp 97.0°F | Resp 16

## 2015-03-21 VITALS — BP 107/62 | HR 99 | Temp 98.5°F | Resp 16 | Ht 73.0 in | Wt 139.6 lb

## 2015-03-21 DIAGNOSIS — Z5111 Encounter for antineoplastic chemotherapy: Secondary | ICD-10-CM | POA: Diagnosis not present

## 2015-03-21 DIAGNOSIS — C321 Malignant neoplasm of supraglottis: Secondary | ICD-10-CM

## 2015-03-21 DIAGNOSIS — Z510 Encounter for antineoplastic radiation therapy: Secondary | ICD-10-CM | POA: Diagnosis not present

## 2015-03-21 MED ORDER — SODIUM CHLORIDE 0.9 % IV SOLN
Freq: Once | INTRAVENOUS | Status: AC
Start: 2015-03-21 — End: 2015-03-21
  Administered 2015-03-21: 08:00:00 via INTRAVENOUS

## 2015-03-21 MED ORDER — SODIUM CHLORIDE 0.9 % IV SOLN
100.0000 mg/m2 | Freq: Once | INTRAVENOUS | Status: AC
  Administered 2015-03-21: 176 mg via INTRAVENOUS
  Filled 2015-03-21: qty 176

## 2015-03-21 MED ORDER — LORAZEPAM 1 MG PO TABS
1.0000 mg | ORAL_TABLET | Freq: Once | ORAL | Status: AC
  Administered 2015-03-21: 1 mg via ORAL

## 2015-03-21 MED ORDER — PALONOSETRON HCL INJECTION 0.25 MG/5ML
INTRAVENOUS | Status: AC
  Filled 2015-03-21: qty 5

## 2015-03-21 MED ORDER — PALONOSETRON HCL INJECTION 0.25 MG/5ML
0.2500 mg | Freq: Once | INTRAVENOUS | Status: AC
  Administered 2015-03-21: 0.25 mg via INTRAVENOUS

## 2015-03-21 MED ORDER — POTASSIUM CHLORIDE 2 MEQ/ML IV SOLN
Freq: Once | INTRAVENOUS | Status: AC
  Administered 2015-03-21: 08:00:00 via INTRAVENOUS
  Filled 2015-03-21: qty 10

## 2015-03-21 MED ORDER — LORAZEPAM 1 MG PO TABS
ORAL_TABLET | ORAL | Status: AC
  Filled 2015-03-21: qty 1

## 2015-03-21 MED ORDER — FOSAPREPITANT DIMEGLUMINE INJECTION 150 MG
Freq: Once | INTRAVENOUS | Status: AC
  Administered 2015-03-21: 11:00:00 via INTRAVENOUS
  Filled 2015-03-21: qty 5

## 2015-03-21 NOTE — Progress Notes (Signed)
Marvin [name] has completed 1 fraction.  He denies pain.  His tracheostomy is draining a yellowish fluid.  He is taking in 3 cans of Jevity per day plus water flushes.  He had his first chemotherapy treatment today.  He reports the mask was uncomfortable over the port a cath.  He has gained 6 lbs since 03/12/15.  He is eating a pureed diet.  He reports spitting up clear phlegm.  BP 107/62 mmHg  Pulse 99  Temp(Src) 98.5 F (36.9 C) (Oral)  Resp 16  Ht 6\' 1"  (1.854 m)  Wt 139 lb 9.6 oz (63.322 kg)  BMI 18.42 kg/m2  SpO2 97%   Wt Readings from Last 3 Encounters:  03/21/15 139 lb 9.6 oz (63.322 kg)  03/12/15 132 lb 14.4 oz (60.283 kg)  03/08/15 143 lb 1.6 oz (64.91 kg)

## 2015-03-21 NOTE — Progress Notes (Signed)
Weekly Management Note:  Outpatient    ICD-9-CM ICD-10-CM   1. 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  CBCT/MVCT images/Port film x-rays were reviewed.  The chart was checked. Marvin [name] has completed 1 fraction. He denies pain. His tracheostomy is draining a yellowish fluid. He is taking in 3 cans of Jevity per day plus water flushes. He had his first chemotherapy treatment today. He reports the mask was uncomfortable over the port a cath. He has gained 6 lbs since 03/12/15. He is eating a pureed diet. He reports spitting up clear phlegm.  Physical Findings:  Wt Readings from Last 3 Encounters:  03/21/15 139 lb 9.6 oz (63.322 kg)  03/12/15 132 lb 14.4 oz (60.283 kg)  03/08/15 143 lb 1.6 oz (64.91 kg)    height is 6\' 1"  (1.854 m) and weight is 139 lb 9.6 oz (63.322 kg). His oral temperature is 98.5 F (36.9 C). His blood pressure is 107/62 and his pulse is 99. His respiration is 16 and oxygen saturation is 97%.  Pt presents to the clinic in a wheelchair. In no acute distress. Trach intact.  CBC    Component Value Date/Time   WBC 8.8 03/20/2015 0853   WBC 10.7* 03/06/2015 1030   RBC 3.72* 03/20/2015 0853   RBC 3.14* 03/06/2015 1030   HGB 12.1* 03/20/2015 0853   HGB 10.5* 03/06/2015 1030   HCT 36.0* 03/20/2015 0853   HCT 30.6* 03/06/2015 1030   PLT 377 03/20/2015 0853   PLT 518* 03/06/2015 1030   MCV 97.0 03/20/2015 0853   MCV 97.5 03/06/2015 1030   MCH 32.6 03/20/2015 0853   MCH 33.4 03/06/2015 1030   MCHC 33.6 03/20/2015 0853   MCHC 34.3 03/06/2015 1030   RDW 13.7 03/20/2015 0853   RDW 12.9 03/06/2015 1030   LYMPHSABS 1.8 03/20/2015 0853   LYMPHSABS 0.8 03/06/2015 1030   MONOABS 0.9 03/20/2015 0853   MONOABS 1.2* 03/06/2015 1030   EOSABS 0.2 03/20/2015 0853   EOSABS 0.1 03/06/2015 1030   BASOSABS 0.1 03/20/2015 0853   BASOSABS 0.0 03/06/2015  1030     CMP     Component Value Date/Time   NA 138 03/20/2015 0853   NA 135 02/14/2015 1439   K 4.1 03/20/2015 0853   K 4.2 02/14/2015 1439   CL 100* 02/14/2015 1439   CO2 26 03/20/2015 0853   CO2 24 02/14/2015 1439   GLUCOSE 95 03/20/2015 0853   GLUCOSE 81 02/14/2015 1439   BUN 9.5 03/20/2015 0853   BUN <5* 02/14/2015 1439   CREATININE 0.6* 03/20/2015 0853   CREATININE 0.65 02/22/2015 1731   CALCIUM 9.5 03/20/2015 0853   CALCIUM 8.5* 02/14/2015 1439   PROT 7.1 03/20/2015 0853   PROT 7.7 02/14/2015 1439   ALBUMIN 3.0* 03/20/2015 0853   ALBUMIN 3.3* 02/14/2015 1439   AST 23 03/20/2015 0853   AST 56* 02/14/2015 1439   ALT 25 03/20/2015 0853   ALT 27 02/14/2015 1439   ALKPHOS 100 03/20/2015 0853   ALKPHOS 113 02/14/2015 1439   BILITOT 0.50 03/20/2015 0853   BILITOT 0.4 02/14/2015 1439   GFRNONAA >60 02/22/2015 1731   GFRAA >60 02/22/2015 1731     Impression:  The patient is tolerating radiotherapy.   Plan:  Continue radiotherapy as planned. Will cut out hole in mask for PAC.  This document serves as  a record of services personally performed by [name], MD. It was created on her behalf by Darcus Austin, a trained medical scribe. The creation of this record is based on the scribe's personal observations and the provider's statements to them. This document has been checked and approved by the attending provider.     -----------------------------------  [name], MD

## 2015-03-21 NOTE — Telephone Encounter (Signed)
Oncology Nurse Navigator Documentation  Oncology Nurse Navigator Flowsheets 03/20/2015 03/21/2015 03/21/2015  Referral date to RadOnc/MedOnc - - -  Navigator Encounter Type Telephone - Telephone- I spoke with Nettie Elm at [facility] to confirm patient's appointment dates and times  thru Monday 8/08. I informed her that patient was pleased to ride the [place] this morning and he had not problems. She voiced understanding and was pleased.   Patient Visit Type - - -  Treatment Phase - First Chemo Tx -  Barriers/Navigation Needs Transportation - -  Interventions Coordination of Care - Coordination of Care  Referrals - - -  Time Spent with Patient 15 15 15

## 2015-03-21 NOTE — Progress Notes (Signed)
IMRT Device Note    ICD-9-CM ICD-10-CM   1. 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 The code is [phone].  -----------------------------------  [name], MD

## 2015-03-21 NOTE — Progress Notes (Signed)
Oncology Nurse Navigator Documentation  Oncology Nurse Navigator Flowsheets 03/20/2015 03/20/2015 03/21/2015  Referral date to RadOnc/MedOnc - - -  Navigator Encounter Type Other Telephone -  Patient Visit Type Medonc - -  Treatment Phase Other - First Chemo Tx- I visited with Mr. Conde in the infusion room. He states that he was able to ride the [place] from [place] this morning. He was pleased. Due to his appointment being so early, he did not get the emla cream on his port. He states they placed ice on his port and he did not have any pain. He left without his syringes and Jevity for feedings. Dory [place] was able to supply. He states that his appetite is improving. He will start his radiation this afternoon. I discussed with him where he will register in the morning and the take the elevator to radiation. I will go down with him this afternoon. All questions and concerns were addressed. He has my business card with my telephone number. I encouraged him to call me with any questions and or concerns. He voiced understanding.  Barriers/Navigation Charity fundraiser -  Interventions Coordination of Care Coordination of Care -  Referrals - - -  Time Spent with Patient [phone]

## 2015-03-21 NOTE — Patient Instructions (Addendum)
[facility] Discharge Instructions for Patients Receiving Chemotherapy  Today you received the following chemotherapy agents:  Cisplatin  To help prevent nausea and vomiting after your treatment, we encourage you to take your nausea medication as ordered per MD.   If you develop nausea and vomiting that is not controlled by your nausea medication, call the clinic.   BELOW ARE SYMPTOMS THAT SHOULD BE REPORTED IMMEDIATELY:  *FEVER GREATER THAN 100.5 F  *CHILLS WITH OR WITHOUT FEVER  NAUSEA AND VOMITING THAT IS NOT CONTROLLED WITH YOUR NAUSEA MEDICATION  *UNUSUAL SHORTNESS OF BREATH  *UNUSUAL BRUISING OR BLEEDING  TENDERNESS IN MOUTH AND THROAT WITH OR WITHOUT PRESENCE OF ULCERS  *URINARY PROBLEMS  *BOWEL PROBLEMS  UNUSUAL RASH Items with * indicate a potential emergency and should be followed up as soon as possible.  Feel free to call the clinic you have any questions or concerns. The clinic phone number is [phone].  Please show the [facility] at check-in to the Emergency Department and triage nurse.  Cisplatin injection What is this medicine? CISPLATIN (SIS pla tin) is a chemotherapy drug. It targets fast dividing cells, like cancer cells, and causes these cells to die. This medicine is used to treat many types of cancer like bladder, ovarian, and testicular cancers. This medicine may be used for other purposes; ask your health care provider or pharmacist if you have questions. COMMON BRAND NAME(S): Platinol, Platinol -AQ What should I tell my health care provider before I take this medicine? They need to know if you have any of these conditions: -blood disorders -hearing problems -kidney disease -recent or ongoing radiation therapy -an unusual or allergic reaction to cisplatin, carboplatin, other chemotherapy, other medicines, foods, dyes, or preservatives -pregnant or trying to get pregnant -breast-feeding How should I use this  medicine? This drug is given as an infusion into a vein. It is administered in a hospital or clinic by a specially trained health care professional. Talk to your pediatrician regarding the use of this medicine in children. Special care may be needed. Overdosage: If you think you have taken too much of this medicine contact a poison control center or emergency room at once. NOTE: This medicine is only for you. Do not share this medicine with others. What if I miss a dose? It is important not to miss a dose. Call your doctor or health care professional if you are unable to keep an appointment. What may interact with this medicine? -dofetilide -foscarnet -medicines for seizures -medicines to increase blood counts like filgrastim, pegfilgrastim, sargramostim -probenecid -pyridoxine used with altretamine -rituximab -some antibiotics like amikacin, gentamicin, neomycin, polymyxin B, streptomycin, tobramycin -sulfinpyrazone -vaccines -zalcitabine Talk to your doctor or health care professional before taking any of these medicines: -acetaminophen -aspirin -ibuprofen -ketoprofen -naproxen This list may not describe all possible interactions. Give your health care provider a list of all the medicines, herbs, non-prescription drugs, or dietary supplements you use. Also tell them if you smoke, drink alcohol, or use illegal drugs. Some items may interact with your medicine. What should I watch for while using this medicine? Your condition will be monitored carefully while you are receiving this medicine. You will need important blood work done while you are taking this medicine. This drug may make you feel generally unwell. This is not uncommon, as chemotherapy can affect healthy cells as well as cancer cells. Report any side effects. Continue your course of treatment even though you feel ill unless your doctor tells you  to stop. In some cases, you may be given additional medicines to help with side  effects. Follow all directions for their use. Call your doctor or health care professional for advice if you get a fever, chills or sore throat, or other symptoms of a cold or flu. Do not treat yourself. This drug decreases your body's ability to fight infections. Try to avoid being around people who are sick. This medicine may increase your risk to bruise or bleed. Call your doctor or health care professional if you notice any unusual bleeding. Be careful brushing and flossing your teeth or using a toothpick because you may get an infection or bleed more easily. If you have any dental work done, tell your dentist you are receiving this medicine. Avoid taking products that contain aspirin, acetaminophen, ibuprofen, naproxen, or ketoprofen unless instructed by your doctor. These medicines may hide a fever. Do not become pregnant while taking this medicine. Women should inform their doctor if they wish to become pregnant or think they might be pregnant. There is a potential for serious side effects to an unborn child. Talk to your health care professional or pharmacist for more information. Do not breast-feed an infant while taking this medicine. Drink fluids as directed while you are taking this medicine. This will help protect your kidneys. Call your doctor or health care professional if you get diarrhea. Do not treat yourself. What side effects may I notice from receiving this medicine? Side effects that you should report to your doctor or health care professional as soon as possible: -allergic reactions like skin rash, itching or hives, swelling of the face, lips, or tongue -signs of infection - fever or chills, cough, sore throat, pain or difficulty passing urine -signs of decreased platelets or bleeding - bruising, pinpoint red spots on the skin, black, tarry stools, nosebleeds -signs of decreased red blood cells - unusually weak or tired, fainting spells, lightheadedness -breathing  problems -changes in hearing -gout pain -low blood counts - This drug may decrease the number of white blood cells, red blood cells and platelets. You may be at increased risk for infections and bleeding. -nausea and vomiting -pain, swelling, redness or irritation at the injection site -pain, tingling, numbness in the hands or feet -problems with balance, movement -trouble passing urine or change in the amount of urine Side effects that usually do not require medical attention (report to your doctor or health care professional if they continue or are bothersome): -changes in vision -loss of appetite -metallic taste in the mouth or changes in taste This list may not describe all possible side effects. Call your doctor for medical advice about side effects. You may report side effects to FDA at 1-800-FDA-1088. Where should I keep my medicine? This drug is given in a hospital or clinic and will not be stored at home. NOTE: This sheet is a summary. It may not cover all possible information. If you have questions about this medicine, talk to your doctor, pharmacist, or health care provider.  2015, Elsevier/Gold Standard. (2007-11-09 14:40:54)

## 2015-03-21 NOTE — Progress Notes (Signed)
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  Patient reports he feels about the same. No new weight with last weight documented as 132 pounds July 25. Patient continues to reside at [place]. Continues to consume a dysphagia 1, pudding thick liquid diet. He has been receiving Jevity 1.2 via PEG 3 times a day. Appetite remains good.  Patient denies nausea and problems with bowels today.  Estimated nutrition needs: 1800-2100 calories, 90-102 grams protein, 2.2 L fluid.  Nutrition diagnosis: Unintended weight loss cannot be evaluated.  Intervention:  Recommended patient continue diet modification as prescribed along with Jevity 1.2 - 3 times a day to meet estimated nutrition needs. Provided patient with appropriate diet tray today for breakfast and lunch, during chemotherapy. Also provided patient with samples for tube feeding, that could be administered today while he is here in the treatment room. RN is aware and agrees to provide bolus feeding.  Monitoring, evaluation, goals:  Patient will continue to tolerate dysphagia 1, pudding thick liquid diet with Jevity 1.2 - 3 times a day via PEG to promote healing and weight gain.  Next visit: Tuesday, August 9, during infusion.  **Disclaimer: This note was dictated with voice recognition software. Similar sounding words can inadvertently be transcribed and this note may contain transcription errors which may not have been corrected upon publication of note.**

## 2015-03-21 NOTE — Progress Notes (Signed)
Oncology Nurse Navigator Documentation  Oncology Nurse Navigator Flowsheets 03/21/2015 03/21/2015 03/21/2015  Referral date to RadOnc/MedOnc - - -  Navigator Encounter Type - Telephone -  Patient Visit Type - - Radonc- Dr. Isidore Moos  Treatment Phase First Chemo Tx - First Radiation Tx- I accompanied Marvin [name] and his [name] to radiation registration and to his first radiation treatment. He just completed his first chemotherapy treatment and he states it went well. He tolerated his first treatment well except the mask pressed on his port site that caused discomfort. The therapist marked the mask and will make an opening for the port so hopefully he will not experience discomfort going forward. He followed up with Dr. Isidore Moos after this treatment. She was pleased with his progress and especially his weight gain. All questions and concerns were addressed.  I asked patient and [name] to call me with any questions or concerns. They voiced understanding.  Barriers/Navigation Needs - - No barriers at this time  Interventions - Coordination of Care -  Referrals - - -  Support Groups/Services - - Friends and Family  Time Spent with Patient [phone]

## 2015-03-22 ENCOUNTER — Ambulatory Visit
Admission: RE | Admit: 2015-03-22 | Discharge: 2015-03-22 | Disposition: A | Discharge status: Home / Self Care | Payer: Medicaid Other | Source: Ambulatory Visit | Attending: Radiation Oncology | Admitting: Radiation Oncology

## 2015-03-22 ENCOUNTER — Ambulatory Visit
Admission: RE | Admit: 2015-03-22 | Discharge: 2015-03-22 | Disposition: A | Discharge status: Home / Self Care | Payer: Federal, State, Local not specified - Other | Source: Ambulatory Visit | Attending: Radiation Oncology | Admitting: Radiation Oncology

## 2015-03-22 ENCOUNTER — Encounter: Payer: Self-pay | Admitting: Medical Oncology

## 2015-03-22 DIAGNOSIS — C321 Malignant neoplasm of supraglottis: Secondary | ICD-10-CM

## 2015-03-22 DIAGNOSIS — Z510 Encounter for antineoplastic radiation therapy: Secondary | ICD-10-CM | POA: Diagnosis not present

## 2015-03-22 MED ORDER — BIAFINE EX EMUL
Freq: Once | CUTANEOUS | Status: AC
  Administered 2015-03-22: 10:00:00 via TOPICAL

## 2015-03-22 NOTE — Addendum Note (Signed)
Encounter addended by: Jacqulyn Liner, RN on: 03/22/2015  3:27 PM<BR>     Documentation filed: Inpatient Patient Education

## 2015-03-22 NOTE — Progress Notes (Signed)
Oncology Nurse Navigator Documentation  Oncology Nurse Navigator Flowsheets 03/21/2015 03/21/2015 03/22/2015  Referral date to RadOnc/MedOnc - - -  Navigator Encounter Type Telephone - -  Patient Visit Type - Radonc Radonc- I saw Marvin [name] this morning in radiation. He looks well and states he had a great night. He had a long day with chemo and radiation yesterday. He states he got back to [place], ate dinner and went to bed. No nausea or other side effects from his treatments.   Treatment Phase - First Radiation Tx Treatment  Barriers/Navigation Needs - No barriers at this time -  Interventions Coordination of Care - -  Referrals - - -  Support Groups/Services - Friends and Family -  Time Spent with Patient [phone]

## 2015-03-22 NOTE — Progress Notes (Signed)
Pt here for patient teaching.  Pt given Radiation and You booklet, Managing Acute Radiation Side Effects for Head and Neck Cancer handout, skin care instructions and Biafine. Pt reports they have watched the Radiation Therapy Education video on March 22, 2015.  Reviewed areas of pertinence such as fatigue, hair loss, mouth changes, skin changes, throat changes and taste changes . Pt able to give teach back of to pat skin, use unscented/gentle soap and drink plenty of water,apply Biafine bid, avoid applying anything to skin within 4 hours of treatment and to use an electric razor if they must shave. Pt demonstrated understanding and verbalizes understanding of information given and will contact nursing with any questions or concerns.    Pt states they have not watched the Radiation Therapy Education.  On March 22, 2015 video presented and watched.

## 2015-03-22 NOTE — Progress Notes (Signed)
Managing Acute Radiation Side Effects for Head and Neck Cancer  Skin irritation:  . Biafine  Topical Emulsion: First-line topical cream to help soothe skin irritation.  Apply to skin in radiation fields at least 4 hours before radiotherapy, or any time after treatments during the rest of the day.  . Triple Antibiotic Ointment (Neosporin): Apply to areas of skin with moist breakdown to prevent infection.  . 1% hydrocortisone cream: Apply to areas of skin that are itching, up to three times a day.  . Silvadene (Silver Sulfadiazine): Used in select cases if large patches of skin develop moist breakdown (let physician or nurse know if you have a "sulfa" drug allergy)  Soreness in mouth or throat: . Baking Soda Rinse: a home remedy to soothe/cleanse mouth and loosen thick saliva.  Mix 1/2 teaspoon salt, 1/2 teaspoon baking soda, 1 pint water (16 oz or two cups).  Swish, gargle and spit as needed to soothe/cleanse mouth. Use as often as you want.  . Sucralfate (Carafate): coats throat to soothe it before meals or any time of day. Crush 1 tablet in 10 mL H20 and swallow up to four times a day.  . 2% viscous Lidocaine (Magic Mouth Wash): Soothes mouth and/or throat by numbing your mucous membranes. Mix 1 part 2% viscous lidocaine (Magic Mouth Wash), 1 part H20. Swish and/or swallow 10mL of this mixture, 30min before meals and at bedtime, up to four times a day. Alternate with Sucralfate (Carafate).  . Narcotics: Various short acting and long acting narcotics can be prescribed.  Often, medical oncology will prescribe these if you are receiving chemotherapy concurrently. Narcotics may cause constipation. It may be helpful to take a stool softener (Docusate Sodium) or gentle laxative (ie Senna or Polyethylene Glycol) to prevent constipation.  Having food in your stomach before ingesting a narcotic may reduce risk of stomach upset.  Thick Saliva: . Baking Soda Rinse: a home remedy to soothe/cleanse mouth  and loosen thick saliva.  Mix 1/2 teaspoon salt, 1/2 teaspoon baking soda, 1 pint water (16 oz or two cups).  Swish, gargle, and spit as needed to soothe/cleanse mouth. Use as often as you want.  . Some patients find Diet Ginger Ale or Papaya Juice to be helpful.  . In extreme cases, your physician may consider prescribing a Scopolamine transdermal patch which dries up your saliva.     Poor taste, or lack of taste:   . There are no well-established medications to combat taste bud changes from radiotherapy.  It often takes weeks to months to regain taste function.  Eating bland foods and drinking nutritional shakes  may help you maintain your weight when food is not enjoyable.  Some patients supplement their oral intake with a feeding tube.  Fatigue and weakness: . There is not a well-established safe and effective medication to combat radiation-induced fatigue.  However, if you are able to perform light exercise (such as a daily walk, yoga, recumbent stationary bicycling), this may combat fatigue and help you maintain muscle mass during treatment.  . Maintaining hydration and nutrition are also important.  If you have not been referred to a nutritionist and would like a referral, please let your nurse or physician know.  . Try to get at least 8 hours of sleep each night. You may need a daily nap, but try not to nap so late that it interferes with your nightly sleep schedule. 

## 2015-03-23 ENCOUNTER — Ambulatory Visit
Admission: RE | Admit: 2015-03-23 | Discharge: 2015-03-23 | Disposition: A | Discharge status: Home / Self Care | Payer: Medicaid Other | Source: Ambulatory Visit | Attending: Radiation Oncology | Admitting: Radiation Oncology

## 2015-03-23 DIAGNOSIS — Z510 Encounter for antineoplastic radiation therapy: Secondary | ICD-10-CM | POA: Diagnosis not present

## 2015-03-26 ENCOUNTER — Ambulatory Visit: Payer: Self-pay

## 2015-03-26 ENCOUNTER — Other Ambulatory Visit: Payer: Self-pay | Admitting: Hematology and Oncology

## 2015-03-26 ENCOUNTER — Ambulatory Visit (HOSPITAL_BASED_OUTPATIENT_CLINIC_OR_DEPARTMENT_OTHER): Payer: Medicaid Other | Admitting: Hematology and Oncology

## 2015-03-26 ENCOUNTER — Ambulatory Visit
Admission: RE | Admit: 2015-03-26 | Discharge: 2015-03-26 | Disposition: A | Discharge status: Home / Self Care | Payer: Medicaid Other | Source: Ambulatory Visit | Attending: Radiation Oncology | Admitting: Radiation Oncology

## 2015-03-26 ENCOUNTER — Ambulatory Visit (HOSPITAL_BASED_OUTPATIENT_CLINIC_OR_DEPARTMENT_OTHER): Payer: Medicaid Other

## 2015-03-26 ENCOUNTER — Encounter: Payer: Self-pay | Admitting: *Deleted

## 2015-03-26 ENCOUNTER — Telehealth: Payer: Self-pay | Admitting: Hematology and Oncology

## 2015-03-26 ENCOUNTER — Ambulatory Visit
Admission: RE | Admit: 2015-03-26 | Discharge: 2015-03-26 | Disposition: A | Discharge status: Home / Self Care | Payer: Self-pay | Source: Ambulatory Visit | Attending: Radiation Oncology | Admitting: Radiation Oncology

## 2015-03-26 ENCOUNTER — Encounter: Payer: Self-pay | Admitting: Radiation Oncology

## 2015-03-26 ENCOUNTER — Encounter: Payer: Self-pay | Admitting: Hematology and Oncology

## 2015-03-26 VITALS — BP 91/50 | HR 68 | Temp 98.3°F | Resp 18

## 2015-03-26 VITALS — BP 85/54 | HR 80

## 2015-03-26 VITALS — BP 60/40 | HR 82 | Temp 97.7°F | Ht 73.0 in | Wt 141.5 lb

## 2015-03-26 DIAGNOSIS — C321 Malignant neoplasm of supraglottis: Secondary | ICD-10-CM

## 2015-03-26 DIAGNOSIS — R110 Nausea: Secondary | ICD-10-CM | POA: Diagnosis not present

## 2015-03-26 DIAGNOSIS — Z510 Encounter for antineoplastic radiation therapy: Secondary | ICD-10-CM | POA: Diagnosis not present

## 2015-03-26 DIAGNOSIS — E43 Unspecified severe protein-calorie malnutrition: Secondary | ICD-10-CM

## 2015-03-26 DIAGNOSIS — T451X5A Adverse effect of antineoplastic and immunosuppressive drugs, initial encounter: Secondary | ICD-10-CM

## 2015-03-26 DIAGNOSIS — K1231 Oral mucositis (ulcerative) due to antineoplastic therapy: Secondary | ICD-10-CM

## 2015-03-26 DIAGNOSIS — Z931 Gastrostomy status: Secondary | ICD-10-CM | POA: Diagnosis not present

## 2015-03-26 MED ORDER — ALTEPLASE 2 MG IJ SOLR
2.0000 mg | Freq: Once | INTRAMUSCULAR | Status: DC | PRN
  Filled 2015-03-26: qty 2

## 2015-03-26 MED ORDER — FENTANYL 25 MCG/HR TD PT72
25.0000 ug | MEDICATED_PATCH | TRANSDERMAL | Status: DC
Start: 2015-03-26 — End: 2015-04-25

## 2015-03-26 MED ORDER — HYDROMORPHONE HCL 1 MG/ML IJ SOLN
2.0000 mg | INTRAMUSCULAR | Status: DC | PRN
Start: 2015-03-26 — End: 2015-03-26
  Administered 2015-03-26: 2 mg via INTRAVENOUS
  Filled 2015-03-26: qty 2

## 2015-03-26 MED ORDER — SODIUM CHLORIDE 0.9 % IJ SOLN
10.0000 mL | INTRAMUSCULAR | Status: DC | PRN
  Administered 2015-03-26: 10 mL
  Filled 2015-03-26: qty 10

## 2015-03-26 MED ORDER — SODIUM CHLORIDE 0.9 % IV SOLN
Freq: Once | INTRAVENOUS | Status: AC
  Administered 2015-03-26: 09:00:00 via INTRAVENOUS
  Filled 2015-03-26: qty 4

## 2015-03-26 MED ORDER — SODIUM CHLORIDE 0.9 % IV SOLN
Freq: Once | INTRAVENOUS | Status: AC
  Administered 2015-03-26: 12:00:00 via INTRAVENOUS

## 2015-03-26 MED ORDER — HYDROMORPHONE HCL 4 MG/ML IJ SOLN
INTRAMUSCULAR | Status: AC
  Filled 2015-03-26: qty 1

## 2015-03-26 MED ORDER — HEPARIN SOD (PORK) LOCK FLUSH 100 UNIT/ML IV SOLN
500.0000 [IU] | Freq: Once | INTRAVENOUS | Status: AC | PRN
  Administered 2015-03-26: 500 [IU]
  Filled 2015-03-26: qty 5

## 2015-03-26 MED ORDER — SODIUM CHLORIDE 0.9 % IV SOLN
Freq: Once | INTRAVENOUS | Status: AC
  Administered 2015-03-26: 09:00:00 via INTRAVENOUS

## 2015-03-26 MED ORDER — MORPHINE SULFATE (CONCENTRATE) 20 MG/ML PO SOLN: 20.0000 mg | ORAL | Status: DC | PRN

## 2015-03-26 MED ORDER — HEPARIN SOD (PORK) LOCK FLUSH 100 UNIT/ML IV SOLN
250.0000 [IU] | Freq: Once | INTRAVENOUS | Status: DC | PRN
  Filled 2015-03-26: qty 5

## 2015-03-26 NOTE — Patient Instructions (Signed)

## 2015-03-26 NOTE — Telephone Encounter (Signed)
Faxed pt medcial records to [facility] [phone]

## 2015-03-26 NOTE — Assessment & Plan Note (Signed)
He is started to experience side effects of treatment with mucositis, pain and nausea. We will continue aggressive supportive care with IV fluids, IV anti-emetics and IV pain medicine as needed. I will start him on fentanyl patch for pain along with Roxanol and I plan to reassess his pain control and nausea in 3 days.

## 2015-03-26 NOTE — Progress Notes (Signed)
Dr. Alvy Bimler made aware of BP values sitting and standing. Pt denies dizziness when changing positions and states he feels better than when he arrived to infusion. Per Dr. Alvy Bimler, pt may be discharged and will return tomorrow for IVFs. Discharged via wheelchair to outside to wait for [place] transportation.

## 2015-03-26 NOTE — Addendum Note (Signed)
Encounter addended by: Benn Moulder, RN on: 03/26/2015  5:07 PM<BR>     Documentation filed: Medications

## 2015-03-26 NOTE — Patient Instructions (Signed)
Dehydration, Adult Dehydration is when you lose more fluids from the body than you take in. Vital organs like the kidneys, brain, and heart cannot function without a proper amount of fluids and salt. Any loss of fluids from the body can cause dehydration.  CAUSES   Vomiting.  Diarrhea.  Excessive sweating.  Excessive urine output.  Fever. SYMPTOMS  Mild dehydration  Thirst.  Dry lips.  Slightly dry mouth. Moderate dehydration  Very dry mouth.  Sunken eyes.  Skin does not bounce back quickly when lightly pinched and released.  Dark urine and decreased urine production.  Decreased tear production.  Headache. Severe dehydration  Very dry mouth.  Extreme thirst.  Rapid, weak pulse (more than 100 beats per minute at rest).  Cold hands and feet.  Not able to sweat in spite of heat and temperature.  Rapid breathing.  Blue lips.  Confusion and lethargy.  Difficulty being awakened.  Minimal urine production.  No tears. DIAGNOSIS  Your caregiver will diagnose dehydration based on your symptoms and your exam. Blood and urine tests will help confirm the diagnosis. The diagnostic evaluation should also identify the cause of dehydration. TREATMENT  Treatment of mild or moderate dehydration can often be done at home by increasing the amount of fluids that you drink. It is best to drink small amounts of fluid more often. Drinking too much at one time can make vomiting worse. Refer to the home care instructions below. Severe dehydration needs to be treated at the hospital where you will probably be given intravenous (IV) fluids that contain water and electrolytes. HOME CARE INSTRUCTIONS   Ask your caregiver about specific rehydration instructions.  Drink enough fluids to keep your urine clear or pale yellow.  Drink small amounts frequently if you have nausea and vomiting.  Eat as you normally do.  Avoid:  Foods or drinks high in sugar.  Carbonated  drinks.  Juice.  Extremely hot or cold fluids.  Drinks with caffeine.  Fatty, greasy foods.  Alcohol.  Tobacco.  Overeating.  Gelatin desserts.  Wash your hands well to avoid spreading bacteria and viruses.  Only take over-the-counter or prescription medicines for pain, discomfort, or fever as directed by your caregiver.  Ask your caregiver if you should continue all prescribed and over-the-counter medicines.  Keep all follow-up appointments with your caregiver. SEEK MEDICAL CARE IF:  You have abdominal pain and it increases or stays in one area (localizes).  You have a rash, stiff neck, or severe headache.  You are irritable, sleepy, or difficult to awaken.  You are weak, dizzy, or extremely thirsty. SEEK IMMEDIATE MEDICAL CARE IF:   You are unable to keep fluids down or you get worse despite treatment.  You have frequent episodes of vomiting or diarrhea.  You have blood or green matter (bile) in your vomit.  You have blood in your stool or your stool looks black and tarry.  You have not urinated in 6 to 8 hours, or you have only urinated a small amount of very dark urine.  You have a fever.  You faint. MAKE SURE YOU:   Understand these instructions.  Will watch your condition.  Will get help right away if you are not doing well or get worse. Document Released: 08/04/2005 Document Revised: 10/27/2011 Document Reviewed: 03/24/2011 ExitCare Patient Information 2015 [employer]. This information is not intended to replace advice given to you by your health care provider. Make sure you discuss any questions you have with your health care   provider.  

## 2015-03-26 NOTE — Assessment & Plan Note (Signed)
He follows closely with speech and language therapist and follow up with nutritionist. He is currently dependent on tube feeds long-term but is able to have some pureed diet He is doing well and is gaining weight.

## 2015-03-26 NOTE — Progress Notes (Addendum)
Marvin [name] has received 4 treatments.  He reports that he has a sore throat at a level 2/10 at this time. Hypotension noted today. Sitting BP: 84/54, P-70 Sitting, BP 60/40, Pulse 82 standing   Trach intact.  Jevity via PEG 3 times daily.   Called [facility] to obtain medication list and informed his nurse, Princess, of his vitals.

## 2015-03-26 NOTE — Progress Notes (Signed)
[place] OFFICE PROGRESS NOTE  Patient Care Team: No Pcp Per Patient as PCP - General (General Practice) [name], MD as Consulting Physician (Otolaryngology) [name], MD as Attending Physician (Radiation Oncology) [name], MD as Consulting Physician (Hematology and Oncology) Leota Sauers, RN as Oncology Nurse Navigator  SUMMARY OF ONCOLOGIC HISTORY: Oncology History   Carcinoma of supraglottis   Staging form: Larynx - Supraglottis, AJCC 7th Edition     Clinical stage from 03/13/2015: Stage IVA (T3, N2b, M0) - Signed by [name], MD on 03/13/2015       Carcinoma of supraglottis   01/22/2015 Imaging CT scan showed enhancing infiltrative mass centered at the right piriform sinus with bilateral neck lymphadenopathy   02/22/2015 - 03/08/2015 Hospital Admission He was admitted to the hospital for management of advanced supraglottic cancer status post tracheostomy, biopsy, dental extraction, placement of feeding tube and subsequent discharge to skilled nursing facility   02/22/2015 Pathology Results Accession: [id number] 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   03/09/2015 PET scan 1)  Right piriform sinus mass with right level 2 nodal metastasis.  2)  Hypermetabolic thoracic nodes which are at least partially felt to be reactive.  3)  No evidence of subdiaphragmatic metastatic disease.    03/16/2015 Procedure He has placement of port   03/21/2015 -  Chemotherapy He received high dose cisplatin   03/21/2015 -  Radiation Therapy He received radiation treatment with chemotherapy    INTERVAL HISTORY: Please see below for problem oriented charting. He is seen urgently at infusion center today due to nausea and uncontrolled pain. He was started on chemotherapy and radiation therapy last week. From the nutrition standpoint, he is doing well and is gaining  weight. He had some nausea after treatment but no vomiting. He complained of severe esophageal pain, rated his pain at 7 out of 10, not relieved by Hycet that was prescribed by his ENT surgeon. He is doing physical therapy and is active at the rehabilitation center.  REVIEW OF SYSTEMS:   Constitutional: Denies fevers, chills or abnormal weight loss Eyes: Denies blurriness of vision Respiratory: Denies cough, dyspnea or wheezes Cardiovascular: Denies palpitation, chest discomfort or lower extremity swelling Skin: Denies abnormal skin rashes Lymphatics: Denies new lymphadenopathy or easy bruising Neurological:Denies numbness, tingling or new weaknesses Behavioral/Psych: Mood is stable, no new changes  All other systems were reviewed with the patient and are negative.  I have reviewed the past medical history, past surgical history, social history and family history with the patient and they are unchanged from previous note.  ALLERGIES:  has No Known Allergies.  MEDICATIONS:  Current Outpatient Prescriptions  Medication Sig Dispense Refill  . amoxicillin-clavulanate (AUGMENTIN) 875-125 MG per tablet Take 1 tablet by mouth 2 (two) times daily. STARTED 10 DAY SUPPLY ON 03/09/2015    . bisacodyl (DULCOLAX) 10 MG suppository Place 10 mg rectally as needed for moderate constipation.    . citalopram (CELEXA) 20 MG tablet 20 mg by PEG Tube route daily.    Marland Kitchen emollient (BIAFINE) cream Apply topically 2 (two) times daily.    . feeding supplement, ENSURE, (ENSURE) PUDG Take 1 Container by mouth 3 (three) times daily between meals.    . fentaNYL (DURAGESIC - DOSED MCG/HR) 25 MCG/HR patch Place 1 patch (25 mcg total) onto the skin every 3 (three) days. 5 patch 0  . folic acid (FOLVITE) 1  MG tablet Take 1 tablet (1 mg total) by mouth daily. 30 tablet 0  . HYDROcodone-acetaminophen (HYCET) 7.5-325 mg/15 ml solution Take 15 mLs by mouth 4 (four) times daily as needed for moderate pain. 120 mL 0  .  ibuprofen (ADVIL,MOTRIN) 100 MG/5ML suspension Take 200 mg by mouth every 4 (four) hours as needed for moderate pain.    Marland Kitchen ibuprofen (ADVIL,MOTRIN) 200 MG tablet Take 1-2 tablets (200-400 mg total) by mouth every 4 (four) hours as needed for fever or moderate pain. 30 tablet 0  . lidocaine-prilocaine (EMLA) cream Apply 1 application topically as needed (FOR PORT ACCESS).    . LORazepam (ATIVAN) 1 MG tablet Take 0.5-1 mg by mouth 3 (three) times daily. TAKE 1 MG FOR 7 DAYS AND WILL CHANGE TO 1/2 TABLET (0.5MG ) ON 03/20/2015    . magnesium hydroxide (MILK OF MAGNESIA) 400 MG/5ML suspension Take 30 mLs by mouth daily as needed for mild constipation.    Marland Kitchen morphine (ROXANOL) 20 MG/ML concentrated solution Take 1 mL (20 mg total) by mouth every 2 (two) hours as needed for moderate pain or severe pain. 240 mL 0  . Multiple Vitamin (MULTIVITAMIN WITH MINERALS) TABS tablet Take 1 tablet by mouth daily. 30 tablet 0  . nicotine (NICODERM CQ - DOSED IN MG/24 HOURS) 21 mg/24hr patch Place 1 patch (21 mg total) onto the skin daily. 28 patch 0  . Nutritional Supplements (FEEDING SUPPLEMENT, JEVITY 1.5 CAL,) LIQD Place 237 mLs into feeding tube 3 (three) times daily.    Marland Kitchen omeprazole (PRILOSEC) 20 MG capsule 20 mg by PEG Tube route daily.    . ondansetron (ZOFRAN) 8 MG tablet 8 mg by PEG Tube route every 8 (eight) hours as needed for nausea or vomiting.    . pravastatin (PRAVACHOL) 20 MG tablet 20 mg by PEG Tube route daily.    . prochlorperazine (COMPAZINE) 10 MG tablet 10 mg by PEG Tube route every 6 (six) hours as needed for nausea or vomiting.    . thiamine 100 MG tablet Take 1 tablet (100 mg total) by mouth daily. 30 tablet 0   No current facility-administered medications for this visit.   Facility-Administered Medications Ordered in Other Visits  Medication Dose Route Frequency Vessie Olmsted Last Rate Last Dose  . alteplase (CATHFLO ACTIVASE) injection 2 mg  2 mg Intracatheter Once PRN [name], MD      .  heparin lock flush 100 unit/mL  500 Units Intracatheter Once PRN [name], MD      . heparin lock flush 100 unit/mL  250 Units Intracatheter Once PRN [name], MD      . HYDROmorphone (DILAUDID) injection 2 mg  2 mg Intravenous Q2H PRN [name], MD   2 mg at 03/26/15 0848  . sodium chloride 0.9 % injection 10 mL  10 mL Intracatheter PRN [name], MD        PHYSICAL EXAMINATION: ECOG PERFORMANCE STATUS: 1 - Symptomatic but completely ambulatory GENERAL:alert, no distress and comfortable SKIN: skin color, texture, turgor are normal, no rashes or significant lesions EYES: normal, Conjunctiva are pink and non-injected, sclera clear OROPHARYNX: Tracheostomy in situ. No mucositis or thrush visible on oral exam  NECK: supple, thyroid normal size, non-tender, without nodularity LYMPH:  Persistent palpable lymphadenopathy in the neck.  LUNGS: clear to auscultation and percussion with normal breathing effort HEART: regular rate & rhythm and no murmurs and no lower extremity edema ABDOMEN:abdomen soft, non-tender and normal bowel sounds. Feeding tube site looks okay Musculoskeletal:no  cyanosis of digits and no clubbing  NEURO: alert & oriented x 3 with fluent speech, no focal motor/sensory deficits  LABORATORY DATA:  I have reviewed the data as listed    Component Value Date/Time   NA 138 03/20/2015 0853   NA 135 02/14/2015 1439   K 4.1 03/20/2015 0853   K 4.2 02/14/2015 1439   CL 100* 02/14/2015 1439   CO2 26 03/20/2015 0853   CO2 24 02/14/2015 1439   GLUCOSE 95 03/20/2015 0853   GLUCOSE 81 02/14/2015 1439   BUN 9.5 03/20/2015 0853   BUN <5* 02/14/2015 1439   CREATININE 0.6* 03/20/2015 0853   CREATININE 0.65 02/22/2015 1731   CALCIUM 9.5 03/20/2015 0853   CALCIUM 8.5* 02/14/2015 1439   PROT 7.1 03/20/2015 0853   PROT 7.7 02/14/2015 1439   ALBUMIN 3.0* 03/20/2015 0853   ALBUMIN 3.3* 02/14/2015 1439   AST 23 03/20/2015 0853   AST 56* 02/14/2015 1439   ALT 25 03/20/2015 0853    ALT 27 02/14/2015 1439   ALKPHOS 100 03/20/2015 0853   ALKPHOS 113 02/14/2015 1439   BILITOT 0.50 03/20/2015 0853   BILITOT 0.4 02/14/2015 1439   GFRNONAA >60 02/22/2015 1731   GFRAA >60 02/22/2015 1731    No results found for: SPEP, UPEP  Lab Results  Component Value Date   WBC 8.8 03/20/2015   NEUTROABS 5.9 03/20/2015   HGB 12.1* 03/20/2015   HCT 36.0* 03/20/2015   MCV 97.0 03/20/2015   PLT 377 03/20/2015      Chemistry      Component Value Date/Time   NA 138 03/20/2015 0853   NA 135 02/14/2015 1439   K 4.1 03/20/2015 0853   K 4.2 02/14/2015 1439   CL 100* 02/14/2015 1439   CO2 26 03/20/2015 0853   CO2 24 02/14/2015 1439   BUN 9.5 03/20/2015 0853   BUN <5* 02/14/2015 1439   CREATININE 0.6* 03/20/2015 0853   CREATININE 0.65 02/22/2015 1731      Component Value Date/Time   CALCIUM 9.5 03/20/2015 0853   CALCIUM 8.5* 02/14/2015 1439   ALKPHOS 100 03/20/2015 0853   ALKPHOS 113 02/14/2015 1439   AST 23 03/20/2015 0853   AST 56* 02/14/2015 1439   ALT 25 03/20/2015 0853   ALT 27 02/14/2015 1439   BILITOT 0.50 03/20/2015 0853   BILITOT 0.4 02/14/2015 1439     ASSESSMENT & PLAN:  Carcinoma of supraglottis He is started to experience side effects of treatment with mucositis, pain and nausea. We will continue aggressive supportive care with IV fluids, IV anti-emetics and IV pain medicine as needed. I will start him on fentanyl patch for pain along with Roxanol and I plan to reassess his pain control and nausea in 3 days.  Protein-calorie malnutrition, severe He follows closely with speech and language therapist and follow up with nutritionist. He is currently dependent on tube feeds long-term but is able to have some pureed diet He is doing well and is gaining weight.    Mucositis due to chemotherapy His pain is poorly controlled. I will start him on fentanyl patch and Roxanol for pain control. I warned him about risk of constipation and I plan to see him  back in 3 days to reassess pain control.  Chemotherapy-induced nausea He has started to experience side effects with nausea. As mentioned above, I will start him on IV fluids daily along with IV anti-emetics.  S/P gastrostomy His feeding tube site looks clean. He is dependent on  feeding tube for nutritional needs. I adjusted his feeding tube so that it is more snug against the abdominal wall today.   No orders of the defined types were placed in this encounter.   All questions were answered. The patient knows to call the clinic with any problems, questions or concerns. No barriers to learning was detected. I spent 25 minutes counseling the patient face to face. The total time spent in the appointment was 30 minutes and more than 50% was on counseling and review of test results     [facility], NI, MD 03/26/2015 9:30 AM

## 2015-03-26 NOTE — Assessment & Plan Note (Signed)
His feeding tube site looks clean. He is dependent on feeding tube for nutritional needs. I adjusted his feeding tube so that it is more snug against the abdominal wall today.

## 2015-03-26 NOTE — Assessment & Plan Note (Signed)
He has started to experience side effects with nausea. As mentioned above, I will start him on IV fluids daily along with IV anti-emetics.

## 2015-03-26 NOTE — Progress Notes (Signed)
Weekly Management Note:  Outpatient    ICD-9-CM ICD-10-CM   1. Carcinoma of supraglottis 161.1 C32.1     Current Dose: 8Gy  Projected Dose: 70 Gy   Narrative:  The patient presents for routine under treatment assessment.  CBCT/MVCT images/Port film x-rays were reviewed.  The chart was checked. Marvin [name] has received 4 treatments.  He reports that he has a sore throat at a level 2/10 at this time. Hypotension noted today. Sitting BP: 84/54, P-70 Sitting; BP 60/40, Pulse 82 standing   --- some dizziness w/ standing.Trach intact.  Jevity via PEG 3 times daily.   Malachy Mood, RN Called [facility] to obtain medication list and informed his nurse, Marvin [name], of his vitals; medications to be faxed here.    Physical Findings:  Wt Readings from Last 3 Encounters:  03/26/15 141 lb 8 oz (64.184 kg)  03/21/15 139 lb 9.6 oz (63.322 kg)  03/12/15 132 lb 14.4 oz (60.283 kg)    height is 6\' 1"  (1.854 m) and weight is 141 lb 8 oz (64.184 kg). His temperature is 97.7 F (36.5 C). His blood pressure is 60/40 and his pulse is 82.  Pt presents to the clinic in a wheelchair. In no acute distress. Trach intact. Skin without irritation. oropharynx moist.  CBC    Component Value Date/Time   WBC 8.8 03/20/2015 0853   WBC 10.7* 03/06/2015 1030   RBC 3.72* 03/20/2015 0853   RBC 3.14* 03/06/2015 1030   HGB 12.1* 03/20/2015 0853   HGB 10.5* 03/06/2015 1030   HCT 36.0* 03/20/2015 0853   HCT 30.6* 03/06/2015 1030   PLT 377 03/20/2015 0853   PLT 518* 03/06/2015 1030   MCV 97.0 03/20/2015 0853   MCV 97.5 03/06/2015 1030   MCH 32.6 03/20/2015 0853   MCH 33.4 03/06/2015 1030   MCHC 33.6 03/20/2015 0853   MCHC 34.3 03/06/2015 1030   RDW 13.7 03/20/2015 0853   RDW 12.9 03/06/2015 1030   LYMPHSABS 1.8 03/20/2015 0853   LYMPHSABS 0.8 03/06/2015 1030   MONOABS 0.9 03/20/2015 0853   MONOABS 1.2* 03/06/2015 1030   EOSABS 0.2 03/20/2015 0853   EOSABS 0.1 03/06/2015 1030   BASOSABS 0.1 03/20/2015 0853   BASOSABS 0.0 03/06/2015 1030     CMP     Component Value Date/Time   NA 138 03/20/2015 0853   NA 135 02/14/2015 1439   K 4.1 03/20/2015 0853   K 4.2 02/14/2015 1439   CL 100* 02/14/2015 1439   CO2 26 03/20/2015 0853   CO2 24 02/14/2015 1439   GLUCOSE 95 03/20/2015 0853   GLUCOSE 81 02/14/2015 1439   BUN 9.5 03/20/2015 0853   BUN <5* 02/14/2015 1439   CREATININE 0.6* 03/20/2015 0853   CREATININE 0.65 02/22/2015 1731   CALCIUM 9.5 03/20/2015 0853   CALCIUM 8.5* 02/14/2015 1439   PROT 7.1 03/20/2015 0853   PROT 7.7 02/14/2015 1439   ALBUMIN 3.0* 03/20/2015 0853   ALBUMIN 3.3* 02/14/2015 1439   AST 23 03/20/2015 0853   AST 56* 02/14/2015 1439   ALT 25 03/20/2015 0853   ALT 27 02/14/2015 1439   ALKPHOS 100 03/20/2015 0853   ALKPHOS 113 02/14/2015 1439   BILITOT 0.50 03/20/2015 0853   BILITOT 0.4 02/14/2015 1439   GFRNONAA >60 02/22/2015 1731   GFRAA >60 02/22/2015 1731     Impression:  The patient is tolerating radiotherapy with hypotension .   Plan:  Continue radiotherapy as planned. I spoke with Dr. Alvy Bimler who will consider further  IVF this AM. Pt will be sent upstairs to med/onc.    -----------------------------------  [name], MD

## 2015-03-26 NOTE — Assessment & Plan Note (Signed)
His pain is poorly controlled. I will start him on fentanyl patch and Roxanol for pain control. I warned him about risk of constipation and I plan to see him back in 3 days to reassess pain control.

## 2015-03-27 ENCOUNTER — Telehealth: Payer: Self-pay | Admitting: *Deleted

## 2015-03-27 ENCOUNTER — Ambulatory Visit (HOSPITAL_BASED_OUTPATIENT_CLINIC_OR_DEPARTMENT_OTHER): Payer: Medicaid Other | Admitting: Nurse Practitioner

## 2015-03-27 ENCOUNTER — Ambulatory Visit: Payer: Self-pay | Admitting: Hematology and Oncology

## 2015-03-27 ENCOUNTER — Ambulatory Visit (HOSPITAL_BASED_OUTPATIENT_CLINIC_OR_DEPARTMENT_OTHER): Payer: Medicaid Other

## 2015-03-27 ENCOUNTER — Other Ambulatory Visit (HOSPITAL_BASED_OUTPATIENT_CLINIC_OR_DEPARTMENT_OTHER): Payer: Medicaid Other

## 2015-03-27 ENCOUNTER — Ambulatory Visit
Admission: RE | Admit: 2015-03-27 | Discharge: 2015-03-27 | Disposition: A | Discharge status: Home / Self Care | Payer: Medicaid Other | Source: Ambulatory Visit | Attending: Radiation Oncology | Admitting: Radiation Oncology

## 2015-03-27 ENCOUNTER — Encounter: Payer: Self-pay | Admitting: *Deleted

## 2015-03-27 ENCOUNTER — Ambulatory Visit: Payer: Self-pay | Admitting: Nutrition

## 2015-03-27 ENCOUNTER — Other Ambulatory Visit: Payer: Self-pay | Admitting: *Deleted

## 2015-03-27 ENCOUNTER — Other Ambulatory Visit: Payer: Self-pay | Admitting: Hematology and Oncology

## 2015-03-27 ENCOUNTER — Encounter: Payer: Self-pay | Admitting: Hematology and Oncology

## 2015-03-27 VITALS — BP 97/68 | HR 94 | Temp 98.3°F | Resp 18

## 2015-03-27 DIAGNOSIS — E43 Unspecified severe protein-calorie malnutrition: Secondary | ICD-10-CM

## 2015-03-27 DIAGNOSIS — E860 Dehydration: Secondary | ICD-10-CM

## 2015-03-27 DIAGNOSIS — C321 Malignant neoplasm of supraglottis: Secondary | ICD-10-CM

## 2015-03-27 DIAGNOSIS — E46 Unspecified protein-calorie malnutrition: Secondary | ICD-10-CM

## 2015-03-27 DIAGNOSIS — Z510 Encounter for antineoplastic radiation therapy: Secondary | ICD-10-CM | POA: Diagnosis not present

## 2015-03-27 DIAGNOSIS — I951 Orthostatic hypotension: Secondary | ICD-10-CM | POA: Diagnosis not present

## 2015-03-27 LAB — COMPREHENSIVE METABOLIC PANEL (CC13)
ALK PHOS: 105 U/L (ref 40–150)
ALT: 34 U/L (ref 0–55)
AST: 30 U/L (ref 5–34)
Albumin: 2.8 g/dL — ABNORMAL LOW (ref 3.5–5.0)
Anion Gap: 7 mEq/L (ref 3–11)
BUN: 11.7 mg/dL (ref 7.0–26.0)
CHLORIDE: 100 meq/L (ref 98–109)
CO2: 31 meq/L — AB (ref 22–29)
CREATININE: 0.6 mg/dL — AB (ref 0.7–1.3)
Calcium: 8.9 mg/dL (ref 8.4–10.4)
GLUCOSE: 86 mg/dL (ref 70–140)
Potassium: 4 mEq/L (ref 3.5–5.1)
Sodium: 137 mEq/L (ref 136–145)
Total Bilirubin: 0.36 mg/dL (ref 0.20–1.20)
Total Protein: 6.4 g/dL (ref 6.4–8.3)

## 2015-03-27 LAB — CBC WITH DIFFERENTIAL/PLATELET
BASO%: 0.1 % (ref 0.0–2.0)
BASOS ABS: 0 10*3/uL (ref 0.0–0.1)
EOS%: 1.6 % (ref 0.0–7.0)
Eosinophils Absolute: 0.1 10*3/uL (ref 0.0–0.5)
HCT: 32.6 % — ABNORMAL LOW (ref 38.4–49.9)
HGB: 10.8 g/dL — ABNORMAL LOW (ref 13.0–17.1)
LYMPH%: 12.4 % — AB (ref 14.0–49.0)
MCH: 32 pg (ref 27.2–33.4)
MCHC: 33.1 g/dL (ref 32.0–36.0)
MCV: 96.7 fL (ref 79.3–98.0)
MONO#: 0.8 10*3/uL (ref 0.1–0.9)
MONO%: 10.5 % (ref 0.0–14.0)
NEUT#: 5.8 10*3/uL (ref 1.5–6.5)
NEUT%: 75.4 % — ABNORMAL HIGH (ref 39.0–75.0)
Platelets: 237 10*3/uL (ref 140–400)
RBC: 3.37 10*6/uL — AB (ref 4.20–5.82)
RDW: 13.1 % (ref 11.0–14.6)
WBC: 7.7 10*3/uL (ref 4.0–10.3)
lymph#: 1 10*3/uL (ref 0.9–3.3)

## 2015-03-27 MED ORDER — JEVITY 1.2 CAL PO LIQD: ORAL | Status: DC

## 2015-03-27 MED ORDER — SODIUM CHLORIDE 0.9 % IJ SOLN
10.0000 mL | INTRAMUSCULAR | Status: DC | PRN
  Administered 2015-03-27: 10 mL
  Filled 2015-03-27: qty 10

## 2015-03-27 MED ORDER — HEPARIN SOD (PORK) LOCK FLUSH 100 UNIT/ML IV SOLN
500.0000 [IU] | Freq: Once | INTRAVENOUS | Status: AC | PRN
  Administered 2015-03-27: 500 [IU]
  Filled 2015-03-27: qty 5

## 2015-03-27 MED ORDER — SODIUM CHLORIDE 0.9 % IV SOLN
1000.0000 mL | Freq: Once | INTRAVENOUS | Status: AC
Start: 2015-03-27 — End: 2015-03-27
  Administered 2015-03-27: 1000 mL via INTRAVENOUS

## 2015-03-27 MED ORDER — SODIUM CHLORIDE 0.9 % IV SOLN
Freq: Once | INTRAVENOUS | Status: AC
  Administered 2015-03-27: 12:00:00 via INTRAVENOUS

## 2015-03-27 NOTE — Progress Notes (Signed)
Entry error

## 2015-03-27 NOTE — Progress Notes (Signed)
Nutrition follow-up completed with patient during IV fluids for cancer of the larynx.   Patient reports he continues to eat 3 meals daily.  He follows a dysphagia 1, pudding thick liquid diet. He continues to reside at [facility]. Patient has been receiving Jevity 1.2 via PEG 3 times a day. Weight has improved and was documented as 141.5 pounds on August 8 increased from 132 pounds July 25. Labs include: Sodium 137, potassium 4.0, BUN 11.7, creatinine 0.6, glucose 86, albumin 2.8. Patient complains of daily nausea which has improved after nausea medications.  He denies vomiting. Patient is having 1-2 formed stools daily. Staff is concerned with dehydration.  Nutrition diagnosis: Unintended weight loss improved.  Estimated nutrition needs: 1800-2100 calories, 90-102 g protein, 2.2 L fluid.  Intervention: Patient should continue oral diet as prescribed. Patient will continue Jevity 1.2 - 3 times a day via PEG. Will increase free water flushes to 125 cc before and after bolus tube feeding +250 cc 3 times a day with meals.  This provides an additional 1500 cc free water. Free water orders were written and provided to [facility]. Teach back method was used.  Monitoring, evaluation, goals:  Patient will tolerate oral diet plus tube feeding and free water flushes to meet greater than 90% Estimated nutrition needs. Will increase free water flushes as needed.    Next visit: To be scheduled.  **Disclaimer: This note was dictated with voice recognition software. Similar sounding words can inadvertently be transcribed and this note may contain transcription errors which may not have been corrected upon publication of note.**

## 2015-03-27 NOTE — Telephone Encounter (Signed)
S/w Nurse, [place], at [facility].   Informed her of new Rx to add Free water 250 cc TID w/ meals and 125 ml before and after each meal (1500 cc total daily).   She verbalize understanding and states they currently have Rx from MD at [facility] for 300 cc free water QID (1200 cc daily).   Faxed new Rx to fax [phone].

## 2015-03-27 NOTE — Patient Instructions (Signed)
Dehydration, Adult Dehydration is when you lose more fluids from the body than you take in. Vital organs like the kidneys, brain, and heart cannot function without a proper amount of fluids and salt. Any loss of fluids from the body can cause dehydration.  CAUSES   Vomiting.  Diarrhea.  Excessive sweating.  Excessive urine output.  Fever. SYMPTOMS  Mild dehydration  Thirst.  Dry lips.  Slightly dry mouth. Moderate dehydration  Very dry mouth.  Sunken eyes.  Skin does not bounce back quickly when lightly pinched and released.  Dark urine and decreased urine production.  Decreased tear production.  Headache. Severe dehydration  Very dry mouth.  Extreme thirst.  Rapid, weak pulse (more than 100 beats per minute at rest).  Cold hands and feet.  Not able to sweat in spite of heat and temperature.  Rapid breathing.  Blue lips.  Confusion and lethargy.  Difficulty being awakened.  Minimal urine production.  No tears. DIAGNOSIS  Your caregiver will diagnose dehydration based on your symptoms and your exam. Blood and urine tests will help confirm the diagnosis. The diagnostic evaluation should also identify the cause of dehydration. TREATMENT  Treatment of mild or moderate dehydration can often be done at home by increasing the amount of fluids that you drink. It is best to drink small amounts of fluid more often. Drinking too much at one time can make vomiting worse. Refer to the home care instructions below. Severe dehydration needs to be treated at the hospital where you will probably be given intravenous (IV) fluids that contain water and electrolytes. HOME CARE INSTRUCTIONS   Ask your caregiver about specific rehydration instructions.  Drink enough fluids to keep your urine clear or pale yellow.  Drink small amounts frequently if you have nausea and vomiting.  Eat as you normally do.  Avoid:  Foods or drinks high in sugar.  Carbonated  drinks.  Juice.  Extremely hot or cold fluids.  Drinks with caffeine.  Fatty, greasy foods.  Alcohol.  Tobacco.  Overeating.  Gelatin desserts.  Wash your hands well to avoid spreading bacteria and viruses.  Only take over-the-counter or prescription medicines for pain, discomfort, or fever as directed by your caregiver.  Ask your caregiver if you should continue all prescribed and over-the-counter medicines.  Keep all follow-up appointments with your caregiver. SEEK MEDICAL CARE IF:  You have abdominal pain and it increases or stays in one area (localizes).  You have a rash, stiff neck, or severe headache.  You are irritable, sleepy, or difficult to awaken.  You are weak, dizzy, or extremely thirsty. SEEK IMMEDIATE MEDICAL CARE IF:   You are unable to keep fluids down or you get worse despite treatment.  You have frequent episodes of vomiting or diarrhea.  You have blood or green matter (bile) in your vomit.  You have blood in your stool or your stool looks black and tarry.  You have not urinated in 6 to 8 hours, or you have only urinated a small amount of very dark urine.  You have a fever.  You faint. MAKE SURE YOU:   Understand these instructions.  Will watch your condition.  Will get help right away if you are not doing well or get worse. Document Released: 08/04/2005 Document Revised: 10/27/2011 Document Reviewed: 03/24/2011 ExitCare Patient Information 2015 [employer]. This information is not intended to replace advice given to you by your health care provider. Make sure you discuss any questions you have with your health care   provider.  

## 2015-03-27 NOTE — Progress Notes (Signed)
Orthostatic performed before IV fluids started, patient complains of dizziness when standing.  Orthostatic vitals taken before IV fluids to be finished. Patient still complains of dizziness when standing. Selena Lesser, NP notified.

## 2015-03-27 NOTE — Progress Notes (Signed)
Per prev notes, the patient has applied for [facility]

## 2015-03-27 NOTE — Progress Notes (Signed)
  Oncology Nurse Navigator Documentation   Navigator Encounter Type: Treatment (03/26/15 0945)     Barriers/Navigation Needs: Education (03/26/15 0945)     Met patient in Infusion where receiving IVF, acknowledged his progress since last week. He stated:  Marvin [name] transport from [place] more satisfying than [facility] since he no longer needs O2 during travel.  Uses O2 at [facility], especially at HS when lying down for sleep. He inquired about increasing water administration via PEG in addition to flushes before/after supplements, statin he knows he is not getting enough by mouth using thickner.  We discussed the importance of hydration during tmts.  I encouraged a goal of 64 oz fluid daily, suggested instillation every 2 hrs.  He verbalized understanding.  I provided him an EPIC calendar of appts. He understands he can contact me with needs/concerns.  [name], RN, BSN, [place] at [place] [phone]

## 2015-03-28 ENCOUNTER — Ambulatory Visit (HOSPITAL_BASED_OUTPATIENT_CLINIC_OR_DEPARTMENT_OTHER): Payer: Medicaid Other

## 2015-03-28 ENCOUNTER — Other Ambulatory Visit: Payer: Self-pay | Admitting: *Deleted

## 2015-03-28 ENCOUNTER — Encounter: Payer: Self-pay | Admitting: Nurse Practitioner

## 2015-03-28 ENCOUNTER — Telehealth: Payer: Self-pay | Admitting: Hematology and Oncology

## 2015-03-28 ENCOUNTER — Ambulatory Visit
Admission: RE | Admit: 2015-03-28 | Discharge: 2015-03-28 | Disposition: A | Discharge status: Home / Self Care | Payer: Medicaid Other | Source: Ambulatory Visit | Attending: Radiation Oncology | Admitting: Radiation Oncology

## 2015-03-28 VITALS — BP 101/62 | HR 63 | Temp 97.9°F | Resp 18

## 2015-03-28 DIAGNOSIS — C321 Malignant neoplasm of supraglottis: Secondary | ICD-10-CM

## 2015-03-28 DIAGNOSIS — E860 Dehydration: Secondary | ICD-10-CM | POA: Diagnosis not present

## 2015-03-28 DIAGNOSIS — I951 Orthostatic hypotension: Secondary | ICD-10-CM

## 2015-03-28 DIAGNOSIS — E43 Unspecified severe protein-calorie malnutrition: Secondary | ICD-10-CM

## 2015-03-28 DIAGNOSIS — Z510 Encounter for antineoplastic radiation therapy: Secondary | ICD-10-CM | POA: Diagnosis not present

## 2015-03-28 MED ORDER — HEPARIN SOD (PORK) LOCK FLUSH 100 UNIT/ML IV SOLN
500.0000 [IU] | Freq: Once | INTRAVENOUS | Status: AC | PRN
  Administered 2015-03-28: 500 [IU]
  Filled 2015-03-28: qty 5

## 2015-03-28 MED ORDER — SODIUM CHLORIDE 0.9 % IJ SOLN
10.0000 mL | INTRAMUSCULAR | Status: DC | PRN
  Administered 2015-03-28: 10 mL
  Filled 2015-03-28: qty 10

## 2015-03-28 MED ORDER — SODIUM CHLORIDE 0.9 % IV SOLN
Freq: Once | INTRAVENOUS | Status: AC
  Administered 2015-03-28 (×2): via INTRAVENOUS

## 2015-03-28 NOTE — Patient Instructions (Signed)
Dehydration, Adult Dehydration is when you lose more fluids from the body than you take in. Vital organs like the kidneys, brain, and heart cannot function without a proper amount of fluids and salt. Any loss of fluids from the body can cause dehydration.  CAUSES   Vomiting.  Diarrhea.  Excessive sweating.  Excessive urine output.  Fever. SYMPTOMS  Mild dehydration  Thirst.  Dry lips.  Slightly dry mouth. Moderate dehydration  Very dry mouth.  Sunken eyes.  Skin does not bounce back quickly when lightly pinched and released.  Dark urine and decreased urine production.  Decreased tear production.  Headache. Severe dehydration  Very dry mouth.  Extreme thirst.  Rapid, weak pulse (more than 100 beats per minute at rest).  Cold hands and feet.  Not able to sweat in spite of heat and temperature.  Rapid breathing.  Blue lips.  Confusion and lethargy.  Difficulty being awakened.  Minimal urine production.  No tears. DIAGNOSIS  Your caregiver will diagnose dehydration based on your symptoms and your exam. Blood and urine tests will help confirm the diagnosis. The diagnostic evaluation should also identify the cause of dehydration. TREATMENT  Treatment of mild or moderate dehydration can often be done at home by increasing the amount of fluids that you drink. It is best to drink small amounts of fluid more often. Drinking too much at one time can make vomiting worse. Refer to the home care instructions below. Severe dehydration needs to be treated at the hospital where you will probably be given intravenous (IV) fluids that contain water and electrolytes. HOME CARE INSTRUCTIONS   Ask your caregiver about specific rehydration instructions.  Drink enough fluids to keep your urine clear or pale yellow.  Drink small amounts frequently if you have nausea and vomiting.  Eat as you normally do.  Avoid:  Foods or drinks high in sugar.  Carbonated  drinks.  Juice.  Extremely hot or cold fluids.  Drinks with caffeine.  Fatty, greasy foods.  Alcohol.  Tobacco.  Overeating.  Gelatin desserts.  Wash your hands well to avoid spreading bacteria and viruses.  Only take over-the-counter or prescription medicines for pain, discomfort, or fever as directed by your caregiver.  Ask your caregiver if you should continue all prescribed and over-the-counter medicines.  Keep all follow-up appointments with your caregiver. SEEK MEDICAL CARE IF:  You have abdominal pain and it increases or stays in one area (localizes).  You have a rash, stiff neck, or severe headache.  You are irritable, sleepy, or difficult to awaken.  You are weak, dizzy, or extremely thirsty. SEEK IMMEDIATE MEDICAL CARE IF:   You are unable to keep fluids down or you get worse despite treatment.  You have frequent episodes of vomiting or diarrhea.  You have blood or green matter (bile) in your vomit.  You have blood in your stool or your stool looks black and tarry.  You have not urinated in 6 to 8 hours, or you have only urinated a small amount of very dark urine.  You have a fever.  You faint. MAKE SURE YOU:   Understand these instructions.  Will watch your condition.  Will get help right away if you are not doing well or get worse. Document Released: 08/04/2005 Document Revised: 10/27/2011 Document Reviewed: 03/24/2011 ExitCare Patient Information 2015 [employer]. This information is not intended to replace advice given to you by your health care provider. Make sure you discuss any questions you have with your health care   provider.  

## 2015-03-28 NOTE — Progress Notes (Addendum)
Pt reported episode of dizziness this morning prior to coming to [facility].  Orthostatic BPs obtained prior to initiation of IVF and are as follows: Sitting: 78/50 Standing: 62/47 Lying: 94/57.  Pt states he does not need antiemetics today.  0850: 1L NS complete; Orthostatics obtained again and are as follows: Sitting: 96/69 Standing: 94/58 Lying: 96/60. Pt reports feeling better after fluids and eating breakfast tray.    1003: 2nd L NS complete; Orthostatics obtained and are as follows: Sitting: 117/71 Standing: 100/61 Lying: 101/62.  Pt reports fluids helped today and he would call with any changes or concerns upon discharge.  Pt left via wheelchair accompanied by staff to Rad Onc for treatment.

## 2015-03-28 NOTE — Assessment & Plan Note (Signed)
Due to patient's cancer diagnosis-patient has a trach and a G-tube for nutrition.  Patient lives in an assisted living facility at the present time.  Patient reports that he has been receiving 3 cans of tube feeds per day; along with a regular thickened liquid diet.  Meal 3 times per day while in the assisted living facility.  However, he reports that he continues to feel dehydrated.  On exam.-Patient does appear fatigued, weak, frail, and chronically ill.  It is also noted that the patient does appear to be experiencing some orthostatic hypotension.  Initial check of orthostatic vitals revealed a lying blood pressure of 89/53, a sitting blood pressure of 80/56, and a standing blood pressure of 70/41.  Confirm the patient does not take any blood pressure medications.   Patient has been scheduled to receive IV fluid rehydration every day this week to assist in hydration.  Also, cancer Center nutritionist has met once again with the patient today; and wanting to increase the free water per patient's G-tube.  Most likely, patient's continued, chronic orthostatic hypotension is secondary to dehydration; since patient has no other new symptoms whatsoever and vital signs remained stable otherwise.

## 2015-03-28 NOTE — Assessment & Plan Note (Signed)
Due to patient's cancer diagnosis-patient has a trach and a G-tube for nutrition.  Patient lives in an assisted living facility at the present time.  Patient reports that he has been receiving 3 cans of tube feeds per day; along with a regular thickened liquid diet.  Meal 3 times per day while in the assisted living facility.  However, he reports that he continues to feel dehydrated.  On exam.-Patient does appear fatigued, weak, frail, and chronically ill.  It is also noted that the patient does appear to be experiencing some orthostatic hypotension.  Initial check of orthostatic vitals revealed a lying blood pressure of 89/53, a sitting blood pressure of 80/56, and a standing blood pressure of 70/41.  Patient has been scheduled to receive IV fluid rehydration every day this week to assist in hydration.  Also, cancer Center nutritionist has met once again with the patient today; and wanting to increase the free water per patient's G-tube.

## 2015-03-28 NOTE — Assessment & Plan Note (Signed)
Patient received his first cycle of cisplatin chemotherapy on the 03/21/2015.  Patient is also undergoing daily radiation treatments.  Patient is scheduled to return on a daily basis to receive IV fluid rehydration and his radiation treatments.

## 2015-03-28 NOTE — Telephone Encounter (Signed)
Appointments made and patient will get a new avs today

## 2015-03-28 NOTE — Progress Notes (Signed)
SYMPTOM MANAGEMENT CLINIC   HPI: Marvin [name] 39 y.o. male diagnosed with carcinoma of the supra glottis.  Currently undergoing cisplatin chemotherapy and radiation treatments.  Due to patient's cancer diagnosis-patient has a trach and a G-tube for nutrition.  Patient lives in an assisted living facility at the present time.  Patient reports that he has been receiving 3 cans of tube feeds per day; along with a regular thickened liquid diet.  Meal 3 times per day while in the assisted living facility.  However, he reports that he continues to feel dehydrated.  On exam.-Patient does appear fatigued, weak, frail, and chronically ill.  It is also noted that the patient does appear to be experiencing some orthostatic hypotension.  Initial check of orthostatic vitals revealed a lying blood pressure of 89/53, a sitting blood pressure of 80/56, and a standing blood pressure of 70/41.  HPI  ROS  Past Medical History  Diagnosis Date  . ETOH abuse   . Alcohol dependence 04/17/2012  . COPD (chronic obstructive pulmonary disease)   . Shortness of breath dyspnea     occ  . Anxiety   . GERD (gastroesophageal reflux disease)   . Seizures     one episode 09/2012, suspected related to ETOH withdrawal  . Anxiety disorder 03/12/2015    Past Surgical History  Procedure Laterality Date  . Knee surgery Left     kneecap -screws  . Appendectomy    . Tracheostomy tube placement N/A 02/22/2015    Procedure: TRACHEOSTOMY;  Surgeon: [name], MD;  Location: [facility];  Service: ENT;  Laterality: N/A;  awake tracheostomy  . Direct laryngoscopy N/A 02/22/2015    Procedure: DIRECT LARYNGOSCOPY;  Surgeon: [name], MD;  Location: [place];  Service: ENT;  Laterality: N/A;  direct laryngoscopy with biopsy  . Multiple extractions with alveoloplasty N/A 03/05/2015    Procedure: Extraction of tooth #'s 1,2,8,9,15,16,17,18,31,32 with alveoloplasty, mandibular right lateral exostoses, and gross debridement of  remaining teeth;  Surgeon: Lenn Cal, DDS;  Location: [place];  Service: Oral Surgery;  Laterality: N/A;    has Alcohol dependence; Transaminitis; Carcinoma of supraglottis; Protein-calorie malnutrition, severe; Dysphagia; Malnutrition; Chronic periodontitis; GERD (gastroesophageal reflux disease); Anxiety disorder; Alcohol abuse; Tobacco abuse; S/P gastrostomy; Mucositis due to chemotherapy; Chemotherapy-induced nausea; Dehydration; and Orthostatic hypotension on his problem list.    has No Known Allergies.    Medication List       This list is accurate as of: 03/27/15 11:59 PM.  Always use your most recent med list.               bisacodyl 10 MG suppository  Commonly known as:  DULCOLAX  Place 10 mg rectally as needed for moderate constipation.     citalopram 20 MG tablet  Commonly known as:  CELEXA  20 mg by PEG Tube route daily.     emollient cream  Commonly known as:  BIAFINE  Apply topically 2 (two) times daily.     feeding supplement (JEVITY 1.2 CAL) Liqd  Give 1 can Jevity 1.2 via PEG TID between meals with 125 ml free water before and after bolus feeding.  In addition, give 250 cc free water TID with meals.     fentaNYL 25 MCG/HR patch  Commonly known as:  DURAGESIC - dosed mcg/hr  Place 1 patch (25 mcg total) onto the skin every 3 (three) days.     folic acid 1 MG tablet  Commonly known as:  FOLVITE  Take  1 tablet (1 mg total) by mouth daily.     HYDROcodone-acetaminophen 7.5-325 mg/15 ml solution  Commonly known as:  HYCET  Take 15 mLs by mouth 4 (four) times daily as needed for moderate pain.     ibuprofen 100 MG/5ML suspension  Commonly known as:  ADVIL,MOTRIN  Take 200 mg by mouth every 4 (four) hours as needed for mild pain.     ibuprofen 100 MG/5ML suspension  Commonly known as:  ADVIL,MOTRIN  Take 400 mg by mouth every 4 (four) hours as needed for moderate pain (given 76m (4015m by G- tube q 4 hours as needed for Moderate pain).      lidocaine-prilocaine cream  Commonly known as:  EMLA  Apply 1 application topically as needed (FOR PORT ACCESS).     LORazepam 1 MG tablet  Commonly known as:  ATIVAN  Take 0.5-1 mg by mouth 3 (three) times daily. TAKE 1 MG FOR 7 DAYS AND WILL CHANGE TO 1/2 TABLET (0.5MG) ON 03/20/2015     magnesium hydroxide 400 MG/5ML suspension  Commonly known as:  MILK OF MAGNESIA  Take 30 mLs by mouth daily as needed for mild constipation.     morphine 20 MG/ML concentrated solution  Commonly known as:  ROXANOL  Take 1 mL (20 mg total) by mouth every 2 (two) hours as needed for moderate pain or severe pain.     multivitamin with minerals Tabs tablet  Take 1 tablet by mouth daily.     nicotine 21 mg/24hr patch  Commonly known as:  NICODERM CQ - dosed in mg/24 hours  Place 1 patch (21 mg total) onto the skin daily.     omeprazole 20 MG capsule  Commonly known as:  PRILOSEC  20 mg by PEG Tube route daily.     ondansetron 8 MG tablet  Commonly known as:  ZOFRAN  8 mg by PEG Tube route every 8 (eight) hours as needed for nausea or vomiting.     pravastatin 20 MG tablet  Commonly known as:  PRAVACHOL  20 mg by PEG Tube route daily.     prochlorperazine 10 MG tablet  Commonly known as:  COMPAZINE  10 mg by PEG Tube route every 6 (six) hours as needed for nausea or vomiting.     thiamine 100 MG tablet  Take 1 tablet (100 mg total) by mouth daily.         PHYSICAL EXAMINATION  Oncology Vitals 03/28/2015 03/28/2015 03/28/2015 03/28/2015 03/28/2015 03/28/2015 03/27/2015  Height - - - - - - -  Weight - - - - - - -  Weight (lbs) - - - - - - -  BMI (kg/m2) - - - - - - -  Temp - - - - - 97.9 -  Pulse 86 79 71 61 73 80 94  Resp - - - - - 17 -  SpO2 - - - - - 95 100  BSA (m2) - - - - - - -  Some encounter information is confidential and restricted. Go to Review Flowsheets activity to see all data.   BP Readings from Last 3 Encounters:  03/28/15 96/60  03/27/15 97/68  03/26/15 85/54     Physical Exam  Constitutional: He is oriented to person, place, and time. He appears malnourished and dehydrated. He appears unhealthy. He appears cachectic.  HENT:  Head: Normocephalic and atraumatic.  Mouth/Throat: Oropharynx is clear and moist.  Eyes: Conjunctivae and EOM are normal. Pupils are equal, round, and reactive  to light. Right eye exhibits no discharge. Left eye exhibits no discharge. No scleral icterus.  Neck: Normal range of motion.  Pulmonary/Chest: Effort normal. No respiratory distress.  Musculoskeletal: Normal range of motion.  Neurological: He is alert and oriented to person, place, and time.  Skin: Skin is warm and dry. There is pallor.  Psychiatric: Affect normal.  Nursing note and vitals reviewed.   LABORATORY DATA:. Appointment on 03/27/2015  Component Date Value Ref Range Status  . Sodium 03/27/2015 137  136 - 145 mEq/L Final  . Potassium 03/27/2015 4.0  3.5 - 5.1 mEq/L Final  . Chloride 03/27/2015 100  98 - 109 mEq/L Final  . CO2 03/27/2015 31* 22 - 29 mEq/L Final  . Glucose 03/27/2015 86  70 - 140 mg/dl Final  . BUN 03/27/2015 11.7  7.0 - 26.0 mg/dL Final  . Creatinine 03/27/2015 0.6* 0.7 - 1.3 mg/dL Final  . Total Bilirubin 03/27/2015 0.36  0.20 - 1.20 mg/dL Final  . Alkaline Phosphatase 03/27/2015 105  40 - 150 U/L Final  . AST 03/27/2015 30  5 - 34 U/L Final  . ALT 03/27/2015 34  0 - 55 U/L Final  . Total Protein 03/27/2015 6.4  6.4 - 8.3 g/dL Final  . Albumin 03/27/2015 2.8* 3.5 - 5.0 g/dL Final  . Calcium 03/27/2015 8.9  8.4 - 10.4 mg/dL Final  . Anion Gap 03/27/2015 7  3 - 11 mEq/L Final  . EGFR 03/27/2015 >90  >90 ml/min/1.73 m2 Final   eGFR is calculated using the CKD-EPI Creatinine Equation (2009)  . WBC 03/27/2015 7.7  4.0 - 10.3 10e3/uL Final  . NEUT# 03/27/2015 5.8  1.5 - 6.5 10e3/uL Final  . HGB 03/27/2015 10.8* 13.0 - 17.1 g/dL Final  . HCT 03/27/2015 32.6* 38.4 - 49.9 % Final  . Platelets 03/27/2015 237  140 - 400 10e3/uL Final   . MCV 03/27/2015 96.7  79.3 - 98.0 fL Final  . MCH 03/27/2015 32.0  27.2 - 33.4 pg Final  . MCHC 03/27/2015 33.1  32.0 - 36.0 g/dL Final  . RBC 03/27/2015 3.37* 4.20 - 5.82 10e6/uL Final  . RDW 03/27/2015 13.1  11.0 - 14.6 % Final  . lymph# 03/27/2015 1.0  0.9 - 3.3 10e3/uL Final  . MONO# 03/27/2015 0.8  0.1 - 0.9 10e3/uL Final  . Eosinophils Absolute 03/27/2015 0.1  0.0 - 0.5 10e3/uL Final  . Basophils Absolute 03/27/2015 0.0  0.0 - 0.1 10e3/uL Final  . NEUT% 03/27/2015 75.4* 39.0 - 75.0 % Final  . LYMPH% 03/27/2015 12.4* 14.0 - 49.0 % Final  . MONO% 03/27/2015 10.5  0.0 - 14.0 % Final  . EOS% 03/27/2015 1.6  0.0 - 7.0 % Final  . BASO% 03/27/2015 0.1  0.0 - 2.0 % Final     RADIOGRAPHIC STUDIES: No results found.  ASSESSMENT/PLAN:    Carcinoma of supraglottis Patient received his first cycle of cisplatin chemotherapy on the 03/21/2015.  Patient is also undergoing daily radiation treatments.  Patient is scheduled to return on a daily basis to receive IV fluid rehydration and his radiation treatments.  Dehydration Due to patient's cancer diagnosis-patient has a trach and a G-tube for nutrition.  Patient lives in an assisted living facility at the present time.  Patient reports that he has been receiving 3 cans of tube feeds per day; along with a regular thickened liquid diet.  Meal 3 times per day while in the assisted living facility.  However, he reports that he continues to feel dehydrated.  On exam.-Patient does appear fatigued, weak, frail, and chronically ill.  It is also noted that the patient does appear to be experiencing some orthostatic hypotension.  Initial check of orthostatic vitals revealed a lying blood pressure of 89/53, a sitting blood pressure of 80/56, and a standing blood pressure of 70/41.  Patient has been scheduled to receive IV fluid rehydration every day this week to assist in hydration.  Also, cancer Center nutritionist has met once again with the patient  today; and wanting to increase the free water per patient's G-tube.  Orthostatic hypotension Due to patient's cancer diagnosis-patient has a trach and a G-tube for nutrition.  Patient lives in an assisted living facility at the present time.  Patient reports that he has been receiving 3 cans of tube feeds per day; along with a regular thickened liquid diet.  Meal 3 times per day while in the assisted living facility.  However, he reports that he continues to feel dehydrated.  On exam.-Patient does appear fatigued, weak, frail, and chronically ill.  It is also noted that the patient does appear to be experiencing some orthostatic hypotension.  Initial check of orthostatic vitals revealed a lying blood pressure of 89/53, a sitting blood pressure of 80/56, and a standing blood pressure of 70/41.  Confirm the patient does not take any blood pressure medications.   Patient has been scheduled to receive IV fluid rehydration every day this week to assist in hydration.  Also, cancer Center nutritionist has met once again with the patient today; and wanting to increase the free water per patient's G-tube.  Most likely, patient's continued, chronic orthostatic hypotension is secondary to dehydration; since patient has no other new symptoms whatsoever and vital signs remained stable otherwise.  Patient stated understanding of all instructions; and was in agreement with this plan of care. The patient knows to call the clinic with any problems, questions or concerns.   Review/collaboration with Dr. Alvy Bimler regarding all aspects of patient's visit today.   Total time spent with patient was 40 minutes;  with greater than 75 percent of that time spent in face to face counseling regarding patient's symptoms,  and coordination of care and follow up.  Disclaimer: This note was dictated with voice recognition software. Similar sounding words can inadvertently be transcribed and may not be corrected upon review.    Drue Second, NP 03/28/2015

## 2015-03-29 ENCOUNTER — Ambulatory Visit
Admission: RE | Admit: 2015-03-29 | Discharge: 2015-03-29 | Disposition: A | Discharge status: Home / Self Care | Payer: Medicaid Other | Source: Ambulatory Visit | Attending: Radiation Oncology | Admitting: Radiation Oncology

## 2015-03-29 ENCOUNTER — Ambulatory Visit (HOSPITAL_BASED_OUTPATIENT_CLINIC_OR_DEPARTMENT_OTHER): Payer: Medicaid Other

## 2015-03-29 VITALS — BP 104/70 | HR 64 | Temp 98.0°F | Resp 20

## 2015-03-29 DIAGNOSIS — C321 Malignant neoplasm of supraglottis: Secondary | ICD-10-CM

## 2015-03-29 DIAGNOSIS — E43 Unspecified severe protein-calorie malnutrition: Secondary | ICD-10-CM

## 2015-03-29 DIAGNOSIS — Z510 Encounter for antineoplastic radiation therapy: Secondary | ICD-10-CM | POA: Diagnosis not present

## 2015-03-29 MED ORDER — SODIUM CHLORIDE 0.9 % IJ SOLN
10.0000 mL | INTRAMUSCULAR | Status: DC | PRN
  Administered 2015-03-29: 10 mL
  Filled 2015-03-29: qty 10

## 2015-03-29 MED ORDER — HEPARIN SOD (PORK) LOCK FLUSH 100 UNIT/ML IV SOLN
500.0000 [IU] | Freq: Once | INTRAVENOUS | Status: AC | PRN
  Administered 2015-03-29: 500 [IU]
  Filled 2015-03-29: qty 5

## 2015-03-29 MED ORDER — SODIUM CHLORIDE 0.9 % IV SOLN
Freq: Once | INTRAVENOUS | Status: AC
  Administered 2015-03-29: 08:00:00 via INTRAVENOUS

## 2015-03-29 MED ORDER — SODIUM CHLORIDE 0.9 % IV SOLN
Freq: Once | INTRAVENOUS | Status: AC
  Administered 2015-03-29: 08:00:00 via INTRAVENOUS
  Filled 2015-03-29: qty 4

## 2015-03-29 NOTE — Patient Instructions (Signed)
Dehydration, Adult Dehydration is when you lose more fluids from the body than you take in. Vital organs like the kidneys, brain, and heart cannot function without a proper amount of fluids and salt. Any loss of fluids from the body can cause dehydration.  CAUSES   Vomiting.  Diarrhea.  Excessive sweating.  Excessive urine output.  Fever. SYMPTOMS  Mild dehydration  Thirst.  Dry lips.  Slightly dry mouth. Moderate dehydration  Very dry mouth.  Sunken eyes.  Skin does not bounce back quickly when lightly pinched and released.  Dark urine and decreased urine production.  Decreased tear production.  Headache. Severe dehydration  Very dry mouth.  Extreme thirst.  Rapid, weak pulse (more than 100 beats per minute at rest).  Cold hands and feet.  Not able to sweat in spite of heat and temperature.  Rapid breathing.  Blue lips.  Confusion and lethargy.  Difficulty being awakened.  Minimal urine production.  No tears. DIAGNOSIS  Your caregiver will diagnose dehydration based on your symptoms and your exam. Blood and urine tests will help confirm the diagnosis. The diagnostic evaluation should also identify the cause of dehydration. TREATMENT  Treatment of mild or moderate dehydration can often be done at home by increasing the amount of fluids that you drink. It is best to drink small amounts of fluid more often. Drinking too much at one time can make vomiting worse. Refer to the home care instructions below. Severe dehydration needs to be treated at the hospital where you will probably be given intravenous (IV) fluids that contain water and electrolytes. HOME CARE INSTRUCTIONS   Ask your caregiver about specific rehydration instructions.  Drink enough fluids to keep your urine clear or pale yellow.  Drink small amounts frequently if you have nausea and vomiting.  Eat as you normally do.  Avoid:  Foods or drinks high in sugar.  Carbonated  drinks.  Juice.  Extremely hot or cold fluids.  Drinks with caffeine.  Fatty, greasy foods.  Alcohol.  Tobacco.  Overeating.  Gelatin desserts.  Wash your hands well to avoid spreading bacteria and viruses.  Only take over-the-counter or prescription medicines for pain, discomfort, or fever as directed by your caregiver.  Ask your caregiver if you should continue all prescribed and over-the-counter medicines.  Keep all follow-up appointments with your caregiver. SEEK MEDICAL CARE IF:  You have abdominal pain and it increases or stays in one area (localizes).  You have a rash, stiff neck, or severe headache.  You are irritable, sleepy, or difficult to awaken.  You are weak, dizzy, or extremely thirsty. SEEK IMMEDIATE MEDICAL CARE IF:   You are unable to keep fluids down or you get worse despite treatment.  You have frequent episodes of vomiting or diarrhea.  You have blood or green matter (bile) in your vomit.  You have blood in your stool or your stool looks black and tarry.  You have not urinated in 6 to 8 hours, or you have only urinated a small amount of very dark urine.  You have a fever.  You faint. MAKE SURE YOU:   Understand these instructions.  Will watch your condition.  Will get help right away if you are not doing well or get worse. Document Released: 08/04/2005 Document Revised: 10/27/2011 Document Reviewed: 03/24/2011 ExitCare Patient Information 2015 [employer]. This information is not intended to replace advice given to you by your health care provider. Make sure you discuss any questions you have with your health care   provider.  

## 2015-03-30 ENCOUNTER — Other Ambulatory Visit: Payer: Self-pay | Admitting: *Deleted

## 2015-03-30 ENCOUNTER — Ambulatory Visit
Admission: RE | Admit: 2015-03-30 | Discharge: 2015-03-30 | Disposition: A | Discharge status: Home / Self Care | Payer: Medicaid Other | Source: Ambulatory Visit | Attending: Radiation Oncology | Admitting: Radiation Oncology

## 2015-03-30 ENCOUNTER — Ambulatory Visit (HOSPITAL_BASED_OUTPATIENT_CLINIC_OR_DEPARTMENT_OTHER): Payer: Medicaid Other

## 2015-03-30 VITALS — BP 120/80 | HR 57 | Temp 97.5°F

## 2015-03-30 DIAGNOSIS — C321 Malignant neoplasm of supraglottis: Secondary | ICD-10-CM

## 2015-03-30 DIAGNOSIS — G4489 Other headache syndrome: Secondary | ICD-10-CM

## 2015-03-30 DIAGNOSIS — R110 Nausea: Secondary | ICD-10-CM

## 2015-03-30 DIAGNOSIS — K1231 Oral mucositis (ulcerative) due to antineoplastic therapy: Secondary | ICD-10-CM

## 2015-03-30 DIAGNOSIS — E43 Unspecified severe protein-calorie malnutrition: Secondary | ICD-10-CM

## 2015-03-30 DIAGNOSIS — Z510 Encounter for antineoplastic radiation therapy: Secondary | ICD-10-CM | POA: Diagnosis not present

## 2015-03-30 MED ORDER — SODIUM CHLORIDE 0.9 % IJ SOLN
10.0000 mL | INTRAMUSCULAR | Status: DC | PRN
  Administered 2015-03-30: 10 mL
  Filled 2015-03-30: qty 10

## 2015-03-30 MED ORDER — HEPARIN SOD (PORK) LOCK FLUSH 100 UNIT/ML IV SOLN
500.0000 [IU] | Freq: Once | INTRAVENOUS | Status: AC | PRN
Start: 2015-03-30 — End: 2015-03-30
  Administered 2015-03-30: 500 [IU]
  Filled 2015-03-30: qty 5

## 2015-03-30 MED ORDER — ACETAMINOPHEN 325 MG PO TABS
650.0000 mg | ORAL_TABLET | ORAL | Status: AC
Start: 2015-03-30 — End: 2015-03-30
  Administered 2015-03-30: 650 mg via ORAL

## 2015-03-30 MED ORDER — SODIUM CHLORIDE 0.9 % IV SOLN
Freq: Once | INTRAVENOUS | Status: AC
  Administered 2015-03-30: 08:00:00 via INTRAVENOUS

## 2015-03-30 MED ORDER — ACETAMINOPHEN 325 MG PO TABS
ORAL_TABLET | ORAL | Status: AC
  Filled 2015-03-30: qty 2

## 2015-03-30 NOTE — Patient Instructions (Signed)
Dehydration, Adult Dehydration is when you lose more fluids from the body than you take in. Vital organs like the kidneys, brain, and heart cannot function without a proper amount of fluids and salt. Any loss of fluids from the body can cause dehydration.  CAUSES   Vomiting.  Diarrhea.  Excessive sweating.  Excessive urine output.  Fever. SYMPTOMS  Mild dehydration  Thirst.  Dry lips.  Slightly dry mouth. Moderate dehydration  Very dry mouth.  Sunken eyes.  Skin does not bounce back quickly when lightly pinched and released.  Dark urine and decreased urine production.  Decreased tear production.  Headache. Severe dehydration  Very dry mouth.  Extreme thirst.  Rapid, weak pulse (more than 100 beats per minute at rest).  Cold hands and feet.  Not able to sweat in spite of heat and temperature.  Rapid breathing.  Blue lips.  Confusion and lethargy.  Difficulty being awakened.  Minimal urine production.  No tears. DIAGNOSIS  Your caregiver will diagnose dehydration based on your symptoms and your exam. Blood and urine tests will help confirm the diagnosis. The diagnostic evaluation should also identify the cause of dehydration. TREATMENT  Treatment of mild or moderate dehydration can often be done at home by increasing the amount of fluids that you drink. It is best to drink small amounts of fluid more often. Drinking too much at one time can make vomiting worse. Refer to the home care instructions below. Severe dehydration needs to be treated at the hospital where you will probably be given intravenous (IV) fluids that contain water and electrolytes. HOME CARE INSTRUCTIONS   Ask your caregiver about specific rehydration instructions.  Drink enough fluids to keep your urine clear or pale yellow.  Drink small amounts frequently if you have nausea and vomiting.  Eat as you normally do.  Avoid:  Foods or drinks high in sugar.  Carbonated  drinks.  Juice.  Extremely hot or cold fluids.  Drinks with caffeine.  Fatty, greasy foods.  Alcohol.  Tobacco.  Overeating.  Gelatin desserts.  Wash your hands well to avoid spreading bacteria and viruses.  Only take over-the-counter or prescription medicines for pain, discomfort, or fever as directed by your caregiver.  Ask your caregiver if you should continue all prescribed and over-the-counter medicines.  Keep all follow-up appointments with your caregiver. SEEK MEDICAL CARE IF:  You have abdominal pain and it increases or stays in one area (localizes).  You have a rash, stiff neck, or severe headache.  You are irritable, sleepy, or difficult to awaken.  You are weak, dizzy, or extremely thirsty. SEEK IMMEDIATE MEDICAL CARE IF:   You are unable to keep fluids down or you get worse despite treatment.  You have frequent episodes of vomiting or diarrhea.  You have blood or green matter (bile) in your vomit.  You have blood in your stool or your stool looks black and tarry.  You have not urinated in 6 to 8 hours, or you have only urinated a small amount of very dark urine.  You have a fever.  You faint. MAKE SURE YOU:   Understand these instructions.  Will watch your condition.  Will get help right away if you are not doing well or get worse. Document Released: 08/04/2005 Document Revised: 10/27/2011 Document Reviewed: 03/24/2011 ExitCare Patient Information 2015 [employer]. This information is not intended to replace advice given to you by your health care provider. Make sure you discuss any questions you have with your health care   provider.  

## 2015-03-31 ENCOUNTER — Ambulatory Visit (HOSPITAL_BASED_OUTPATIENT_CLINIC_OR_DEPARTMENT_OTHER): Payer: Medicaid Other

## 2015-03-31 VITALS — BP 123/83 | HR 70 | Temp 98.5°F | Resp 20

## 2015-03-31 DIAGNOSIS — E43 Unspecified severe protein-calorie malnutrition: Secondary | ICD-10-CM | POA: Diagnosis not present

## 2015-03-31 DIAGNOSIS — C321 Malignant neoplasm of supraglottis: Secondary | ICD-10-CM

## 2015-03-31 MED ORDER — HEPARIN SOD (PORK) LOCK FLUSH 100 UNIT/ML IV SOLN
250.0000 [IU] | Freq: Once | INTRAVENOUS | Status: DC | PRN
  Filled 2015-03-31: qty 5

## 2015-03-31 MED ORDER — SODIUM CHLORIDE 0.9 % IV SOLN
Freq: Once | INTRAVENOUS | Status: DC
  Filled 2015-03-31 (×2): qty 4

## 2015-03-31 MED ORDER — SODIUM CHLORIDE 0.9 % IV SOLN
Freq: Once | INTRAVENOUS | Status: AC
  Administered 2015-03-31: 10:00:00 via INTRAVENOUS

## 2015-03-31 MED ORDER — HEPARIN SOD (PORK) LOCK FLUSH 100 UNIT/ML IV SOLN
500.0000 [IU] | Freq: Once | INTRAVENOUS | Status: AC | PRN
  Administered 2015-03-31: 500 [IU]
  Filled 2015-03-31: qty 5

## 2015-03-31 MED ORDER — ALTEPLASE 2 MG IJ SOLR
2.0000 mg | Freq: Once | INTRAMUSCULAR | Status: DC | PRN
Start: 2015-03-31 — End: 2015-03-31
  Filled 2015-03-31: qty 2

## 2015-03-31 MED ORDER — SODIUM CHLORIDE 0.9 % IV SOLN
Freq: Once | INTRAVENOUS | Status: AC
  Administered 2015-03-31: 09:00:00 via INTRAVENOUS

## 2015-03-31 MED ORDER — SODIUM CHLORIDE 0.9 % IJ SOLN
10.0000 mL | INTRAMUSCULAR | Status: DC | PRN
  Administered 2015-03-31: 10 mL
  Filled 2015-03-31: qty 10

## 2015-03-31 NOTE — Patient Instructions (Signed)
Dehydration, Adult Dehydration is when you lose more fluids from the body than you take in. Vital organs like the kidneys, brain, and heart cannot function without a proper amount of fluids and salt. Any loss of fluids from the body can cause dehydration.  CAUSES   Vomiting.  Diarrhea.  Excessive sweating.  Excessive urine output.  Fever. SYMPTOMS  Mild dehydration  Thirst.  Dry lips.  Slightly dry mouth. Moderate dehydration  Very dry mouth.  Sunken eyes.  Skin does not bounce back quickly when lightly pinched and released.  Dark urine and decreased urine production.  Decreased tear production.  Headache. Severe dehydration  Very dry mouth.  Extreme thirst.  Rapid, weak pulse (more than 100 beats per minute at rest).  Cold hands and feet.  Not able to sweat in spite of heat and temperature.  Rapid breathing.  Blue lips.  Confusion and lethargy.  Difficulty being awakened.  Minimal urine production.  No tears. DIAGNOSIS  Your caregiver will diagnose dehydration based on your symptoms and your exam. Blood and urine tests will help confirm the diagnosis. The diagnostic evaluation should also identify the cause of dehydration. TREATMENT  Treatment of mild or moderate dehydration can often be done at home by increasing the amount of fluids that you drink. It is best to drink small amounts of fluid more often. Drinking too much at one time can make vomiting worse. Refer to the home care instructions below. Severe dehydration needs to be treated at the hospital where you will probably be given intravenous (IV) fluids that contain water and electrolytes. HOME CARE INSTRUCTIONS   Ask your caregiver about specific rehydration instructions.  Drink enough fluids to keep your urine clear or pale yellow.  Drink small amounts frequently if you have nausea and vomiting.  Eat as you normally do.  Avoid:  Foods or drinks high in sugar.  Carbonated  drinks.  Juice.  Extremely hot or cold fluids.  Drinks with caffeine.  Fatty, greasy foods.  Alcohol.  Tobacco.  Overeating.  Gelatin desserts.  Wash your hands well to avoid spreading bacteria and viruses.  Only take over-the-counter or prescription medicines for pain, discomfort, or fever as directed by your caregiver.  Ask your caregiver if you should continue all prescribed and over-the-counter medicines.  Keep all follow-up appointments with your caregiver. SEEK MEDICAL CARE IF:  You have abdominal pain and it increases or stays in one area (localizes).  You have a rash, stiff neck, or severe headache.  You are irritable, sleepy, or difficult to awaken.  You are weak, dizzy, or extremely thirsty. SEEK IMMEDIATE MEDICAL CARE IF:   You are unable to keep fluids down or you get worse despite treatment.  You have frequent episodes of vomiting or diarrhea.  You have blood or green matter (bile) in your vomit.  You have blood in your stool or your stool looks black and tarry.  You have not urinated in 6 to 8 hours, or you have only urinated a small amount of very dark urine.  You have a fever.  You faint. MAKE SURE YOU:   Understand these instructions.  Will watch your condition.  Will get help right away if you are not doing well or get worse. Document Released: 08/04/2005 Document Revised: 10/27/2011 Document Reviewed: 03/24/2011 ExitCare Patient Information 2015 [employer]. This information is not intended to replace advice given to you by your health care provider. Make sure you discuss any questions you have with your health care   provider.  

## 2015-04-02 ENCOUNTER — Ambulatory Visit (HOSPITAL_BASED_OUTPATIENT_CLINIC_OR_DEPARTMENT_OTHER): Payer: Medicaid Other

## 2015-04-02 ENCOUNTER — Encounter: Payer: Self-pay | Admitting: *Deleted

## 2015-04-02 ENCOUNTER — Ambulatory Visit
Admission: RE | Admit: 2015-04-02 | Discharge: 2015-04-02 | Disposition: A | Discharge status: Home / Self Care | Payer: Self-pay | Source: Ambulatory Visit | Attending: Radiation Oncology | Admitting: Radiation Oncology

## 2015-04-02 ENCOUNTER — Ambulatory Visit
Admission: RE | Admit: 2015-04-02 | Discharge: 2015-04-02 | Disposition: A | Discharge status: Home / Self Care | Payer: Medicaid Other | Source: Ambulatory Visit | Attending: Radiation Oncology | Admitting: Radiation Oncology

## 2015-04-02 VITALS — BP 75/58 | HR 88 | Temp 97.8°F | Resp 12 | Wt 137.9 lb

## 2015-04-02 VITALS — BP 112/87 | HR 78 | Temp 97.8°F

## 2015-04-02 DIAGNOSIS — C321 Malignant neoplasm of supraglottis: Secondary | ICD-10-CM

## 2015-04-02 DIAGNOSIS — Z510 Encounter for antineoplastic radiation therapy: Secondary | ICD-10-CM | POA: Diagnosis not present

## 2015-04-02 DIAGNOSIS — R110 Nausea: Secondary | ICD-10-CM

## 2015-04-02 DIAGNOSIS — E43 Unspecified severe protein-calorie malnutrition: Secondary | ICD-10-CM

## 2015-04-02 DIAGNOSIS — K1231 Oral mucositis (ulcerative) due to antineoplastic therapy: Secondary | ICD-10-CM

## 2015-04-02 MED ORDER — HYDROMORPHONE HCL 4 MG/ML IJ SOLN
INTRAMUSCULAR | Status: AC
  Filled 2015-04-02: qty 1

## 2015-04-02 MED ORDER — SODIUM CHLORIDE 0.9 % IJ SOLN
10.0000 mL | INTRAMUSCULAR | Status: DC | PRN
  Administered 2015-04-02: 10 mL
  Filled 2015-04-02: qty 10

## 2015-04-02 MED ORDER — HYDROMORPHONE HCL 1 MG/ML IJ SOLN
2.0000 mg | INTRAMUSCULAR | Status: DC | PRN
  Administered 2015-04-02: 2 mg via INTRAVENOUS
  Filled 2015-04-02: qty 2

## 2015-04-02 MED ORDER — HEPARIN SOD (PORK) LOCK FLUSH 100 UNIT/ML IV SOLN
500.0000 [IU] | Freq: Once | INTRAVENOUS | Status: AC | PRN
  Administered 2015-04-02: 500 [IU]
  Filled 2015-04-02: qty 5

## 2015-04-02 MED ORDER — SUCRALFATE 1 G PO TABS: ORAL_TABLET | ORAL | Status: DC

## 2015-04-02 MED ORDER — SODIUM CHLORIDE 0.9 % IV SOLN
Freq: Once | INTRAVENOUS | Status: AC
  Administered 2015-04-02: 12:00:00 via INTRAVENOUS
  Filled 2015-04-02: qty 4

## 2015-04-02 MED ORDER — SODIUM CHLORIDE 0.9 % IV SOLN
Freq: Once | INTRAVENOUS | Status: AC
  Administered 2015-04-02: 12:00:00 via INTRAVENOUS

## 2015-04-02 MED ORDER — LIDOCAINE VISCOUS 2 % MT SOLN: OROMUCOSAL | Status: DC

## 2015-04-02 NOTE — Progress Notes (Signed)
  Oncology Nurse Navigator Documentation   Navigator Encounter Type: Clinic/MDC (04/02/15 3009)     To provide support and encouragement, care continuity and to assess for needs, met with patient prior to daily Tomo and during WUT with Dr. Isidore Moos.  Patient in wheelchair for safety. We discussed daily regime for increasing daily free water, i.e. Addition of large cup of water between instillations of FW with thrice daily nutritional supplement. Provided Epic calendar of appts, took him to [facility] Infusion for scheduled IVF.     [name], RN, BSN, [facility] [phone]

## 2015-04-02 NOTE — Progress Notes (Signed)
Weekly Management Note:  Outpatient    ICD-9-CM ICD-10-CM   1. Carcinoma of supraglottis 161.1 C32.1     Current Dose: 18 Gy  Projected Dose: 70 Gy   Narrative:  The patient presents for routine under treatment assessment.  CBCT/MVCT images/Port film x-rays were reviewed.  The chart was checked.  PAIN: He rates his pain as a 4 on a scale of 0-10. intermittent and sharp over Throat  SWALLOWING/DIET: Pt denies dysphagia-pudding thick liquid diet. Pt reports a GTube formula-Jevity, 3 Times a day with flush.  Peg tube site appearance-dried yellow crusting with slight erythema. Oral exam reveals mucous membranes moist with clear sputum.  BOWEL: Pt reports Nausea and Constipation- everyday BM firm. SKIN:  Pt continues to apply Biafine as directed. OTHER: Pt complains of fatigue and weakness. Reports feeling dizzy when standing, esp in morning. He reports complying with Barb Neff's nutritional / hydrational recommendations per PEG.  WEIGHT/VS: Wt Readings from Last 3 Encounters:  04/02/15 137 lb 14.4 oz (62.551 kg)  03/26/15 141 lb 8 oz (64.184 kg)  03/21/15 139 lb 9.6 oz (63.322 kg)         Physical Findings:    weight is 137 lb 14.4 oz (62.551 kg). His oral temperature is 97.8 F (36.6 C). His blood pressure is 75/58 and his pulse is 88. His respiration is 12 and oxygen saturation is 100%.   STANDING: 65/44 pulse 102. Pt presents to the clinic in a wheelchair. In no acute distress. Trach intact. Skin without irritation. Level II palpable mass, right. Oropharynx moist / no lesions.  CBC    Component Value Date/Time   WBC 7.7 03/27/2015 0950   WBC 10.7* 03/06/2015 1030   RBC 3.37* 03/27/2015 0950   RBC 3.14* 03/06/2015 1030   HGB 10.8* 03/27/2015 0950   HGB 10.5* 03/06/2015 1030   HCT 32.6* 03/27/2015 0950   HCT 30.6* 03/06/2015 1030   PLT 237 03/27/2015 0950   PLT 518* 03/06/2015 1030   MCV 96.7 03/27/2015 0950   MCV 97.5 03/06/2015 1030   MCH 32.0 03/27/2015 0950   MCH 33.4 03/06/2015 1030   MCHC 33.1 03/27/2015 0950   MCHC 34.3 03/06/2015 1030   RDW 13.1 03/27/2015 0950   RDW 12.9 03/06/2015 1030   LYMPHSABS 1.0 03/27/2015 0950   LYMPHSABS 0.8 03/06/2015 1030   MONOABS 0.8 03/27/2015 0950   MONOABS 1.2* 03/06/2015 1030   EOSABS 0.1 03/27/2015 0950   EOSABS 0.1 03/06/2015 1030   BASOSABS 0.0 03/27/2015 0950   BASOSABS 0.0 03/06/2015 1030     CMP     Component Value Date/Time   NA 137 03/27/2015 0950   NA 135 02/14/2015 1439   K 4.0 03/27/2015 0950   K 4.2 02/14/2015 1439   CL 100* 02/14/2015 1439   CO2 31* 03/27/2015 0950   CO2 24 02/14/2015 1439   GLUCOSE 86 03/27/2015 0950   GLUCOSE 81 02/14/2015 1439   BUN 11.7 03/27/2015 0950   BUN <5* 02/14/2015 1439   CREATININE 0.6* 03/27/2015 0950   CREATININE 0.65 02/22/2015 1731   CALCIUM 8.9 03/27/2015 0950   CALCIUM 8.5* 02/14/2015 1439   PROT 6.4 03/27/2015 0950   PROT 7.7 02/14/2015 1439   ALBUMIN 2.8* 03/27/2015 0950   ALBUMIN 3.3* 02/14/2015 1439   AST 30 03/27/2015 0950   AST 56* 02/14/2015 1439   ALT 34 03/27/2015 0950   ALT 27 02/14/2015 1439   ALKPHOS 105 03/27/2015 0950   ALKPHOS 113 02/14/2015 1439  BILITOT 0.36 03/27/2015 0950   BILITOT 0.4 02/14/2015 1439   GFRNONAA >60 02/22/2015 1731   GFRAA >60 02/22/2015 1731     Impression:  The patient is tolerating radiotherapy with cont'd hypotension .   Plan:  Continue radiotherapy as planned.REceiving daily IV fluids.  Labs tomorrow.  Unclear if his hypotension is completely due to dehydration.  Labs pending tomorrow will examine renal function.  Med/onc will continue to monitor hydrational needs.   Rx for carafate and lidocaine given for esophagitis -----------------------------------  [name], MD

## 2015-04-02 NOTE — Progress Notes (Signed)
Patient discharged with no complaints.  Ambulated well.  Post vital signs stable.

## 2015-04-02 NOTE — Progress Notes (Signed)
PAIN: He rates his pain as a 4 on a scale of 0-10. intermittent and sharp over Throat  SWALLOWING/DIET: Pt denies dysphagia-pudding thick liquid diet. Pt reports a GTube formula-Jevity, 3 Times a day with flush.  Peg tube site appearance-dried yellow crusting with slight erythema. Oral exam reveals mucous membranes moist with clear sputum.  BOWEL: Pt reports Nausea and Constipation- everyday BM firm. SKIN: Skin exam reveals warm dry and intact. Pt continues to apply Biafine as directed. OTHER: Pt complains of fatigue and weakness. Reports feeling dizzy when standing, esp in morning.  WEIGHT/VS: Wt Readings from Last 3 Encounters:  04/02/15 137 lb 14.4 oz (62.551 kg)  03/26/15 141 lb 8 oz (64.184 kg)  03/21/15 139 lb 9.6 oz (63.322 kg)   BP 75/58 mmHg  Pulse 88  Temp(Src) 97.8 F (36.6 C) (Oral)  Resp 12  Wt 137 lb 14.4 oz (62.551 kg)  SpO2 100% Orthostatic Standing Vital Signs: BP:65/44 P:102 Pox:100

## 2015-04-02 NOTE — Patient Instructions (Signed)

## 2015-04-03 ENCOUNTER — Ambulatory Visit: Payer: Self-pay | Admitting: Nutrition

## 2015-04-03 ENCOUNTER — Other Ambulatory Visit (HOSPITAL_BASED_OUTPATIENT_CLINIC_OR_DEPARTMENT_OTHER): Payer: Medicaid Other

## 2015-04-03 ENCOUNTER — Ambulatory Visit (HOSPITAL_COMMUNITY): Payer: Self-pay | Admitting: Dentistry

## 2015-04-03 ENCOUNTER — Ambulatory Visit
Admission: RE | Admit: 2015-04-03 | Discharge: 2015-04-03 | Disposition: A | Discharge status: Home / Self Care | Payer: Medicaid Other | Source: Ambulatory Visit | Attending: Radiation Oncology | Admitting: Radiation Oncology

## 2015-04-03 ENCOUNTER — Ambulatory Visit: Payer: Self-pay | Admitting: Hematology and Oncology

## 2015-04-03 ENCOUNTER — Ambulatory Visit (HOSPITAL_BASED_OUTPATIENT_CLINIC_OR_DEPARTMENT_OTHER): Payer: Medicaid Other

## 2015-04-03 ENCOUNTER — Encounter (HOSPITAL_COMMUNITY): Payer: Self-pay | Admitting: Dentistry

## 2015-04-03 VITALS — BP 91/60 | HR 58 | Temp 98.2°F | Resp 18

## 2015-04-03 VITALS — BP 102/64 | HR 81 | Temp 98.5°F | Wt 143.0 lb

## 2015-04-03 DIAGNOSIS — Z0189 Encounter for other specified special examinations: Secondary | ICD-10-CM

## 2015-04-03 DIAGNOSIS — C321 Malignant neoplasm of supraglottis: Secondary | ICD-10-CM

## 2015-04-03 DIAGNOSIS — E43 Unspecified severe protein-calorie malnutrition: Secondary | ICD-10-CM

## 2015-04-03 DIAGNOSIS — K1231 Oral mucositis (ulcerative) due to antineoplastic therapy: Secondary | ICD-10-CM

## 2015-04-03 DIAGNOSIS — K117 Disturbances of salivary secretion: Secondary | ICD-10-CM

## 2015-04-03 DIAGNOSIS — T148XXD Other injury of unspecified body region, subsequent encounter: Secondary | ICD-10-CM

## 2015-04-03 DIAGNOSIS — R682 Dry mouth, unspecified: Secondary | ICD-10-CM

## 2015-04-03 DIAGNOSIS — Z510 Encounter for antineoplastic radiation therapy: Secondary | ICD-10-CM | POA: Diagnosis not present

## 2015-04-03 DIAGNOSIS — R110 Nausea: Secondary | ICD-10-CM

## 2015-04-03 DIAGNOSIS — R1310 Dysphagia, unspecified: Secondary | ICD-10-CM

## 2015-04-03 LAB — CBC WITH DIFFERENTIAL/PLATELET
BASO%: 0.4 % (ref 0.0–2.0)
Basophils Absolute: 0 10*3/uL (ref 0.0–0.1)
EOS ABS: 0.1 10*3/uL (ref 0.0–0.5)
EOS%: 2.1 % (ref 0.0–7.0)
HEMATOCRIT: 30 % — AB (ref 38.4–49.9)
HGB: 10.2 g/dL — ABNORMAL LOW (ref 13.0–17.1)
LYMPH#: 1 10*3/uL (ref 0.9–3.3)
LYMPH%: 19.3 % (ref 14.0–49.0)
MCH: 32.7 pg (ref 27.2–33.4)
MCHC: 34 g/dL (ref 32.0–36.0)
MCV: 96.2 fL (ref 79.3–98.0)
MONO#: 0.6 10*3/uL (ref 0.1–0.9)
MONO%: 10.6 % (ref 0.0–14.0)
NEUT%: 67.6 % (ref 39.0–75.0)
NEUTROS ABS: 3.5 10*3/uL (ref 1.5–6.5)
PLATELETS: 142 10*3/uL (ref 140–400)
RBC: 3.12 10*6/uL — AB (ref 4.20–5.82)
RDW: 13.7 % (ref 11.0–14.6)
WBC: 5.2 10*3/uL (ref 4.0–10.3)

## 2015-04-03 LAB — COMPREHENSIVE METABOLIC PANEL (CC13)
ALK PHOS: 113 U/L (ref 40–150)
ALT: 21 U/L (ref 0–55)
AST: 19 U/L (ref 5–34)
Albumin: 3 g/dL — ABNORMAL LOW (ref 3.5–5.0)
Anion Gap: 8 mEq/L (ref 3–11)
BUN: 10.1 mg/dL (ref 7.0–26.0)
CHLORIDE: 101 meq/L (ref 98–109)
CO2: 28 meq/L (ref 22–29)
Calcium: 8.9 mg/dL (ref 8.4–10.4)
Creatinine: 0.6 mg/dL — ABNORMAL LOW (ref 0.7–1.3)
GLUCOSE: 106 mg/dL (ref 70–140)
POTASSIUM: 4 meq/L (ref 3.5–5.1)
SODIUM: 137 meq/L (ref 136–145)
Total Bilirubin: 0.35 mg/dL (ref 0.20–1.20)
Total Protein: 6.4 g/dL (ref 6.4–8.3)

## 2015-04-03 MED ORDER — SODIUM CHLORIDE 0.9 % IV SOLN
Freq: Once | INTRAVENOUS | Status: AC
  Administered 2015-04-03: 11:00:00 via INTRAVENOUS
  Filled 2015-04-03: qty 4

## 2015-04-03 MED ORDER — SODIUM FLUORIDE 1.1 % DT CREA: TOPICAL_CREAM | DENTAL | Status: DC

## 2015-04-03 MED ORDER — HEPARIN SOD (PORK) LOCK FLUSH 100 UNIT/ML IV SOLN
500.0000 [IU] | Freq: Once | INTRAVENOUS | Status: AC | PRN
  Administered 2015-04-03: 500 [IU]
  Filled 2015-04-03: qty 5

## 2015-04-03 MED ORDER — SODIUM CHLORIDE 0.9 % IJ SOLN
10.0000 mL | INTRAMUSCULAR | Status: DC | PRN
  Administered 2015-04-03: 10 mL
  Filled 2015-04-03: qty 10

## 2015-04-03 MED ORDER — SODIUM CHLORIDE 0.9 % IV SOLN
Freq: Once | INTRAVENOUS | Status: AC
  Administered 2015-04-03: 11:00:00 via INTRAVENOUS

## 2015-04-03 NOTE — Progress Notes (Signed)
Nutrition follow-up completed with patient during infusion.  Patient is receiving IV fluids and is receiving treatment for cancer of the larynx. Patient continues to follow a dysphagia 1, pudding thick liquid diet and consumes 3 large meals daily. He also receives Jevity 1.2 via PEG 3 times a day. Patient reports free water flushes increased to 125 cc before and after bolus feedings +250 cc 3 times a day with meals providing an additional 1500 cc free water. Patient reports occasional nausea which is improved with nausea medication. Bowels are normal.  He is having a formed stool daily. Patient continues to experience hypotension and is receiving additional IV fluids regularly. Weight is increased was documented as 143 pounds on August 16 increased from 141.5 pounds August 8.  Nutrition diagnosis: Unintended weight loss improved.  Estimated nutrition needs: 1800-2100 calories, 90-102 g protein, 2.2 L fluid. Patient is receiving 2073 cc free water from tube feeding and free water flushes. Patient also receives free water from liquids on meal trays.  Intervention: I encouraged patient to continue diet as ordered with additional fluids as tolerated. Patient has achieved weight gain so we'll continue enteral feedings as ordered. Teach back method used.  Monitoring, evaluation, goals: Patient will tolerate diet plus enteral feeding and free water flushes to meet estimated nutrition needs.  Next visit: Will continue to follow as needed.  **Disclaimer: This note was dictated with voice recognition software. Similar sounding words can inadvertently be transcribed and this note may contain transcription errors which may not have been corrected upon publication of note.**

## 2015-04-03 NOTE — Progress Notes (Signed)
04/03/2015  Patient Name:   Marvin [name] Date of Birth:   September 13, 1975 Medical Record Number: [id number]  BP 102/64 mmHg  Pulse 81  Temp(Src) 98.5 F (36.9 C) (Oral)  Wt 143 lb (64.864 kg)  [name] presents for oral examination during radiation therapy. Patient has completed 9/35 radiation treatments. Patient has completed 1 cycle of chemotherapy.  REVIEW OF CHIEF COMPLAINTS:  DRY MOUTH: Yes HARD TO SWALLOW: Yes  HURT TO SWALLOW: Yes TASTE CHANGES: No problems with taste SORES IN MOUTH: No sores in his mouth TRISMUS: No problems with trismus WEIGHT: 143 pounds  HOME OH REGIMEN:  BRUSHING: 3 times a day FLOSSING: Once a day RINSING: Using salt water baking soda rinses and Biotene rinses FLUORIDE: Patient was prescribed PreviDent 5000 today. Patient is to apply thin ribbon of gel to the toothbrush at brush at bedtime. Patient is to spit out excess --do not swallow. Repeat nightly TRISMUS EXERCISES:  Maximum interincisal opening: 35 mm   DENTAL EXAM:  Oral Hygiene:(PLAQUE): Good oral hygiene LOCATION OF MUCOSITIS: None noted DESCRIPTION OF SALIVA: Incipient xerostomia ANY EXPOSED BONE: History of exposed bone of mandibular right lingual alveolar ridge area #32. A 3 mm x 9 mm spicule of bone was removed today without complications. Soft tissue coverage over the area was noted. OTHER WATCHED AREAS: Previous extraction sites DX: Xerostomia, Dysphagia, Odynophagia and History of exposed bone-resolved  RECOMMENDATIONS: 1. Brush after meals and at bedtime.  Use fluoride at bedtime. 2. Use trismus exercises as directed. 3. Use Biotene Rinse or salt water/baking soda rinses. 4. Multiple sips of water as needed. 5. Return to clinic in two months for oral exam after radiation therapy. Call if problems before then.  Lenn Cal, DDS

## 2015-04-03 NOTE — Patient Instructions (Addendum)
RECOMMENDATIONS: 1. Brush after meals and at bedtime.  Use fluoride at bedtime. 2. Use trismus exercises as directed. 3. Use Biotene Rinse or salt water/baking soda rinses. 4. Multiple sips of water as needed. 5. Return to clinic in two months for oral exam after radiation therapy. Call if problems before then.  Marvin [name], DDS     RADIATION THERAPY AND DECISIONS REGARDING YOUR TEETH  Xerostomia (dry mouth) Your salivary glands may be in the filed of radiation.  Radiation may include all or part of your saliva glands.  This will cause your saliva to dry up and you will have a dry mouth.  The dry mouth will be for the rest of your life unless your radiation oncologist tells you otherwise.  Your saliva has many functions:  Saliva wets your tongue for speaking.  It coats your teeth and the inside of your mouth for easier movement.  It helps with chewing and swallowing food.  It helps clean away harmful acid and toxic products made by the germs in your mouth, therefore it helps prevent cavities.  It kills some germs in your mouth and helps to prevent gum disease.  It helps to carry flavor to your taste buds.  Once you have lost your saliva you will be at higher risk for tooth decay and gum disease.  What can be done to help improve your mouth when there's not enough saliva:  1.  Your dentist may give a prescription for Salagen.  It will not bring back all of your saliva but may bring back some of it.  Also your saliva may be thick and ropy or white and foamy. It will not feel like it use to feel.  2.  You will need to swish with water every time your mouth feels dry.  YOU CANNOT suck on any cough drops, mints, lemon drops, candy, vitamin C or any other products.  You cannot use anything other than water to make your mouth feel less dry.  If you want to drink anything else you have to drink it all at once and brush afterwards.  Be sure to discuss the details of your diet habits  with your dentist or hygienist.  Radiation caries: This is decay that happens very quickly once your mouth is very dry due to radiation therapy.  Normally cavities take six months to two years to become a problem.  When you have dry mouth cavities may take as little as eight weeks to cause you a problem.  This is why dental check ups every two months are necessary as long as you have a dry mouth. Radiation caries typically, but not always, start at your gum line where it is hard to see the cavity.  It is therefore also hard to fill these cavities adequately.  This high rate of cavities happens because your mouth no longer has saliva and therefore the acid made by the germs starts the decay process.  Whenever you eat anything the germs in your mouth change the food into acid.  The acid then burns a small hole in your tooth.  This small hole is the beginning of a cavity.  If this is not treated then it will grow bigger and become a cavity.  The way to avoid this hole getting bigger is to use fluoride every evening as prescribed by your dentist.  You have to make sure that your teeth are very clean before you use the fluoride.  This fluoride in turn  will strengthen your teeth and prepare them for another day of fighting acid.  If you develop radiation caries many times the damage is so large that you will have to have all your teeth removed.  This could be a big problem if some of these teeth are in the field of radiation.  Further details of why this could be a big problem will follow.  (See Osteoradionecrosis).  Loss of taste (dysgeusia) This happens to varying degrees once you've had radiation therapy to your jaw region.  Many times taste is not completely lost but becomes limited.  The loss of taste is mostly due to radiation affecting your taste buds.  However if you have no saliva in your mouth to carry the flavor to your taste buds it would be difficult for your taste buds to taste anything.  That is why  using water or a prescription for Salagen prior to meals and during meals may help with some of the taste.  Keep in mind that taste generally returns very slowly over the course of several months or several years after radiation therapy.  Don't give up hope.  Trismus According to your Radiation Oncologist your TMJ or jaw joints are going to be partially or fully in the field of radiation.  This means that over time the muscles that help you open and close your mouth may get stiff.  This will potentially result in your not being able to open your mouth wide enough or as wide as you can open it now.  Le me give you an example of how slowly this happens and how unaware people are of it.  A gentlemen that had radiation therapy two years ago came back to me complaining that bananas are just too large for him to be able to fit them in between his teeth.  He was not able to open wide enough to bite into a banana.  This happens slowly and over a period of time.  What do we do to try and prevent this?  Your dentist will probably give you a stack of sticks called a trismus exercise device .  This stack will help your remind your muscles and your jaw joint to open up to the same distance every day.  Use these sticks every morning when you wake up according to the instructions given by the dentist.   You must use these sticks for at least one to two years after radiation therapy.  The reason for that is because it happens so slowly and keeps going on for about two years after radiation therapy.  Your hospital dentist will help you monitor your mouth opening and make sure that it's not getting smaller.  Osteoradionecrosis (ORN) This is a condition where your jaw bone after having had radiation therapy becomes very dry.  It has very little blood supply to keep it alive.  If you develop a cavity that turns into an abscess or an infection then the jaw bone does not have enough blood supply to help fight the infection.  At  this point it is very likely that the infection could cause the death of your jaw bone.  When you have dead bone it has to be removed.  Therefore you might end up having to have surgery to remove part of your jaw bone, the part of the jaw bone that has been affected.   Healing is also a problem if you are to have surgery in the areas where the bone  has had radiation therapy.  The same reasons apply.  If you have surgery you need more blood supply which is not available.  When blood supply and oxygen are not available again, there is a chance for the bone to die.  Occasionally ORN happens on its own with no obvious reason.  This is quite rare.  We believe that patients who continue to smoke and/or drink alcohol have a higher chance of having this bone problem.  Therefore once your jaw bone has had radiation therapy if there are any teeth in that area, you should never have them pulled.  You should also never have any surgery on your teeth or gums in that area unless the oral surgeon or Periodontist is aware of your history of radiation. There is some expensive management techniques that might be used to limit your risks.  The risks for ORN either from infection or spontaneous ( or on it's own) are life long.    TRISMUS  Trismus is a condition where the jaw does not allow the mouth to open as wide as it usually does.  This can happen almost suddenly, or in other cases the process is so slow, it is hard to notice it-until it is too far along.  When the jaw joints and/or muscles have been exposed to radiation treatments, the onset of Trismus is very slow.  This is because the muscles are losing their stretching ability over a long period of time, as long as 2 YEARS after the end of radiation.  It is therefore important to exercise these muscles and joints.  TRISMUS EXERCISES   Stack of tongue depressors measuring the same or a little less than the last documented MIO (Maximum Interincisal Opening).  Secure  them with a rubber band on both ends.  Place the stack in the patient's mouth, supporting the other end.  Allow 30 seconds for muscle stretching.  Rest for a few seconds.  Repeat 3-5 times  For all radiation patients, this exercise is recommended in the mornings and evenings unless otherwise instructed.  The exercise should be done for a period of 2 YEARS after the end of radiation.  MIO should be checked routinely on recall dental visits by the general dentist or the hospital dentist.  The patient is advised to report any changes, soreness, or difficulties encountered when doing the exercises.

## 2015-04-03 NOTE — Patient Instructions (Addendum)
Dehydration, Adult Dehydration is when you lose more fluids from the body than you take in. Vital organs like the kidneys, brain, and heart cannot function without a proper amount of fluids and salt. Any loss of fluids from the body can cause dehydration.  CAUSES   Vomiting.  Diarrhea.  Excessive sweating.  Excessive urine output.  Fever. SYMPTOMS  Mild dehydration  Thirst.  Dry lips.  Slightly dry mouth. Moderate dehydration  Very dry mouth.  Sunken eyes.  Skin does not bounce back quickly when lightly pinched and released.  Dark urine and decreased urine production.  Decreased tear production.  Headache. Severe dehydration  Very dry mouth.  Extreme thirst.  Rapid, weak pulse (more than 100 beats per minute at rest).  Cold hands and feet.  Not able to sweat in spite of heat and temperature.  Rapid breathing.  Blue lips.  Confusion and lethargy.  Difficulty being awakened.  Minimal urine production.  No tears. DIAGNOSIS  Your caregiver will diagnose dehydration based on your symptoms and your exam. Blood and urine tests will help confirm the diagnosis. The diagnostic evaluation should also identify the cause of dehydration. TREATMENT  Treatment of mild or moderate dehydration can often be done at home by increasing the amount of fluids that you drink. It is best to drink small amounts of fluid more often. Drinking too much at one time can make vomiting worse. Refer to the home care instructions below. Severe dehydration needs to be treated at the hospital where you will probably be given intravenous (IV) fluids that contain water and electrolytes. HOME CARE INSTRUCTIONS   Ask your caregiver about specific rehydration instructions.  Drink enough fluids to keep your urine clear or pale yellow.  Drink small amounts frequently if you have nausea and vomiting.  Eat as you normally do.  Avoid:  Foods or drinks high in sugar.  Carbonated  drinks.  Juice.  Extremely hot or cold fluids.  Drinks with caffeine.  Fatty, greasy foods.  Alcohol.  Tobacco.  Overeating.  Gelatin desserts.  Wash your hands well to avoid spreading bacteria and viruses.  Only take over-the-counter or prescription medicines for pain, discomfort, or fever as directed by your caregiver.  Ask your caregiver if you should continue all prescribed and over-the-counter medicines.  Keep all follow-up appointments with your caregiver. SEEK MEDICAL CARE IF:  You have abdominal pain and it increases or stays in one area (localizes).  You have a rash, stiff neck, or severe headache.  You are irritable, sleepy, or difficult to awaken.  You are weak, dizzy, or extremely thirsty. SEEK IMMEDIATE MEDICAL CARE IF:   You are unable to keep fluids down or you get worse despite treatment.  You have frequent episodes of vomiting or diarrhea.  You have blood or green matter (bile) in your vomit.  You have blood in your stool or your stool looks black and tarry.  You have not urinated in 6 to 8 hours, or you have only urinated a small amount of very dark urine.  You have a fever.  You faint. MAKE SURE YOU:   Understand these instructions.  Will watch your condition.  Will get help right away if you are not doing well or get worse. Document Released: 08/04/2005 Document Revised: 10/27/2011 Document Reviewed: 03/24/2011 Exit[employer] Patient Information 2015 [employer]. This information is not intended to replace advice given to you by your health care provider. Make sure you discuss any questions you have with your health care   provider.  

## 2015-04-04 ENCOUNTER — Ambulatory Visit
Admission: RE | Admit: 2015-04-04 | Discharge: 2015-04-04 | Disposition: A | Discharge status: Home / Self Care | Payer: Medicaid Other | Source: Ambulatory Visit | Attending: Radiation Oncology | Admitting: Radiation Oncology

## 2015-04-04 ENCOUNTER — Ambulatory Visit (HOSPITAL_BASED_OUTPATIENT_CLINIC_OR_DEPARTMENT_OTHER): Payer: Medicaid Other

## 2015-04-04 ENCOUNTER — Other Ambulatory Visit: Payer: Self-pay | Admitting: *Deleted

## 2015-04-04 VITALS — BP 119/82 | HR 66 | Temp 97.3°F | Resp 18

## 2015-04-04 DIAGNOSIS — E860 Dehydration: Secondary | ICD-10-CM | POA: Diagnosis present

## 2015-04-04 DIAGNOSIS — Z510 Encounter for antineoplastic radiation therapy: Secondary | ICD-10-CM | POA: Diagnosis not present

## 2015-04-04 DIAGNOSIS — E43 Unspecified severe protein-calorie malnutrition: Secondary | ICD-10-CM

## 2015-04-04 DIAGNOSIS — I951 Orthostatic hypotension: Secondary | ICD-10-CM

## 2015-04-04 DIAGNOSIS — C321 Malignant neoplasm of supraglottis: Secondary | ICD-10-CM | POA: Diagnosis not present

## 2015-04-04 MED ORDER — HEPARIN SOD (PORK) LOCK FLUSH 100 UNIT/ML IV SOLN
500.0000 [IU] | Freq: Once | INTRAVENOUS | Status: AC | PRN
  Administered 2015-04-04: 500 [IU]
  Filled 2015-04-04: qty 5

## 2015-04-04 MED ORDER — HYDROMORPHONE HCL 4 MG/ML IJ SOLN
INTRAMUSCULAR | Status: AC
  Filled 2015-04-04: qty 1

## 2015-04-04 MED ORDER — HYDROMORPHONE HCL 1 MG/ML IJ SOLN
2.0000 mg | INTRAMUSCULAR | Status: DC | PRN
  Administered 2015-04-04: 2 mg via INTRAVENOUS
  Filled 2015-04-04: qty 2

## 2015-04-04 MED ORDER — SODIUM CHLORIDE 0.9 % IJ SOLN
10.0000 mL | INTRAMUSCULAR | Status: DC | PRN
  Administered 2015-04-04: 10 mL
  Filled 2015-04-04: qty 10

## 2015-04-04 MED ORDER — SODIUM CHLORIDE 0.9 % IV SOLN
Freq: Once | INTRAVENOUS | Status: AC
  Administered 2015-04-04: 08:00:00 via INTRAVENOUS

## 2015-04-04 MED ORDER — SODIUM CHLORIDE 0.9 % IV SOLN
Freq: Once | INTRAVENOUS | Status: AC
  Administered 2015-04-04: 09:00:00 via INTRAVENOUS
  Filled 2015-04-04: qty 4

## 2015-04-04 NOTE — Patient Instructions (Signed)
Dehydration, Adult Dehydration is when you lose more fluids from the body than you take in. Vital organs like the kidneys, brain, and heart cannot function without a proper amount of fluids and salt. Any loss of fluids from the body can cause dehydration.  CAUSES   Vomiting.  Diarrhea.  Excessive sweating.  Excessive urine output.  Fever. SYMPTOMS  Mild dehydration  Thirst.  Dry lips.  Slightly dry mouth. Moderate dehydration  Very dry mouth.  Sunken eyes.  Skin does not bounce back quickly when lightly pinched and released.  Dark urine and decreased urine production.  Decreased tear production.  Headache. Severe dehydration  Very dry mouth.  Extreme thirst.  Rapid, weak pulse (more than 100 beats per minute at rest).  Cold hands and feet.  Not able to sweat in spite of heat and temperature.  Rapid breathing.  Blue lips.  Confusion and lethargy.  Difficulty being awakened.  Minimal urine production.  No tears. DIAGNOSIS  Your caregiver will diagnose dehydration based on your symptoms and your exam. Blood and urine tests will help confirm the diagnosis. The diagnostic evaluation should also identify the cause of dehydration. TREATMENT  Treatment of mild or moderate dehydration can often be done at home by increasing the amount of fluids that you drink. It is best to drink small amounts of fluid more often. Drinking too much at one time can make vomiting worse. Refer to the home care instructions below. Severe dehydration needs to be treated at the hospital where you will probably be given intravenous (IV) fluids that contain water and electrolytes. HOME CARE INSTRUCTIONS   Ask your caregiver about specific rehydration instructions.  Drink enough fluids to keep your urine clear or pale yellow.  Drink small amounts frequently if you have nausea and vomiting.  Eat as you normally do.  Avoid:  Foods or drinks high in sugar.  Carbonated  drinks.  Juice.  Extremely hot or cold fluids.  Drinks with caffeine.  Fatty, greasy foods.  Alcohol.  Tobacco.  Overeating.  Gelatin desserts.  Wash your hands well to avoid spreading bacteria and viruses.  Only take over-the-counter or prescription medicines for pain, discomfort, or fever as directed by your caregiver.  Ask your caregiver if you should continue all prescribed and over-the-counter medicines.  Keep all follow-up appointments with your caregiver. SEEK MEDICAL CARE IF:  You have abdominal pain and it increases or stays in one area (localizes).  You have a rash, stiff neck, or severe headache.  You are irritable, sleepy, or difficult to awaken.  You are weak, dizzy, or extremely thirsty. SEEK IMMEDIATE MEDICAL CARE IF:   You are unable to keep fluids down or you get worse despite treatment.  You have frequent episodes of vomiting or diarrhea.  You have blood or green matter (bile) in your vomit.  You have blood in your stool or your stool looks black and tarry.  You have not urinated in 6 to 8 hours, or you have only urinated a small amount of very dark urine.  You have a fever.  You faint. MAKE SURE YOU:   Understand these instructions.  Will watch your condition.  Will get help right away if you are not doing well or get worse. Document Released: 08/04/2005 Document Revised: 10/27/2011 Document Reviewed: 03/24/2011 ExitCare Patient Information 2015 [employer]. This information is not intended to replace advice given to you by your health care provider. Make sure you discuss any questions you have with your health care   provider.  

## 2015-04-05 ENCOUNTER — Ambulatory Visit
Admission: RE | Admit: 2015-04-05 | Discharge: 2015-04-05 | Disposition: A | Discharge status: Home / Self Care | Payer: Medicaid Other | Source: Ambulatory Visit | Attending: Radiation Oncology | Admitting: Radiation Oncology

## 2015-04-05 ENCOUNTER — Encounter: Payer: Self-pay | Admitting: *Deleted

## 2015-04-05 ENCOUNTER — Telehealth: Payer: Self-pay | Admitting: *Deleted

## 2015-04-05 ENCOUNTER — Ambulatory Visit (HOSPITAL_BASED_OUTPATIENT_CLINIC_OR_DEPARTMENT_OTHER): Payer: Medicaid Other

## 2015-04-05 ENCOUNTER — Telehealth: Payer: Self-pay | Admitting: Hematology and Oncology

## 2015-04-05 VITALS — BP 134/85 | HR 61 | Temp 98.2°F | Resp 20

## 2015-04-05 DIAGNOSIS — K1231 Oral mucositis (ulcerative) due to antineoplastic therapy: Secondary | ICD-10-CM | POA: Diagnosis not present

## 2015-04-05 DIAGNOSIS — R110 Nausea: Secondary | ICD-10-CM

## 2015-04-05 DIAGNOSIS — C321 Malignant neoplasm of supraglottis: Secondary | ICD-10-CM | POA: Diagnosis not present

## 2015-04-05 DIAGNOSIS — Z510 Encounter for antineoplastic radiation therapy: Secondary | ICD-10-CM | POA: Diagnosis not present

## 2015-04-05 DIAGNOSIS — E43 Unspecified severe protein-calorie malnutrition: Secondary | ICD-10-CM | POA: Diagnosis not present

## 2015-04-05 MED ORDER — SODIUM CHLORIDE 0.9 % IJ SOLN
10.0000 mL | INTRAMUSCULAR | Status: DC | PRN
Start: 2015-04-05 — End: 2015-04-05
  Administered 2015-04-05: 10 mL
  Filled 2015-04-05: qty 10

## 2015-04-05 MED ORDER — SODIUM CHLORIDE 0.9 % IV SOLN
Freq: Once | INTRAVENOUS | Status: AC
  Administered 2015-04-05: 11:00:00 via INTRAVENOUS
  Filled 2015-04-05: qty 4

## 2015-04-05 MED ORDER — HYDROMORPHONE HCL 1 MG/ML IJ SOLN
2.0000 mg | INTRAMUSCULAR | Status: DC | PRN
  Filled 2015-04-05: qty 2

## 2015-04-05 MED ORDER — SODIUM CHLORIDE 0.9 % IV SOLN
Freq: Once | INTRAVENOUS | Status: AC
  Administered 2015-04-05: 11:00:00 via INTRAVENOUS

## 2015-04-05 MED ORDER — HYDROMORPHONE HCL 4 MG/ML IJ SOLN
INTRAMUSCULAR | Status: AC
  Filled 2015-04-05: qty 1

## 2015-04-05 MED ORDER — HEPARIN SOD (PORK) LOCK FLUSH 100 UNIT/ML IV SOLN
500.0000 [IU] | Freq: Once | INTRAVENOUS | Status: AC | PRN
  Administered 2015-04-05: 500 [IU]
  Filled 2015-04-05: qty 5

## 2015-04-05 NOTE — Patient Instructions (Signed)
Dehydration, Adult Dehydration is when you lose more fluids from the body than you take in. Vital organs like the kidneys, brain, and heart cannot function without a proper amount of fluids and salt. Any loss of fluids from the body can cause dehydration.  CAUSES   Vomiting.  Diarrhea.  Excessive sweating.  Excessive urine output.  Fever. SYMPTOMS  Mild dehydration  Thirst.  Dry lips.  Slightly dry mouth. Moderate dehydration  Very dry mouth.  Sunken eyes.  Skin does not bounce back quickly when lightly pinched and released.  Dark urine and decreased urine production.  Decreased tear production.  Headache. Severe dehydration  Very dry mouth.  Extreme thirst.  Rapid, weak pulse (more than 100 beats per minute at rest).  Cold hands and feet.  Not able to sweat in spite of heat and temperature.  Rapid breathing.  Blue lips.  Confusion and lethargy.  Difficulty being awakened.  Minimal urine production.  No tears. DIAGNOSIS  Your caregiver will diagnose dehydration based on your symptoms and your exam. Blood and urine tests will help confirm the diagnosis. The diagnostic evaluation should also identify the cause of dehydration. TREATMENT  Treatment of mild or moderate dehydration can often be done at home by increasing the amount of fluids that you drink. It is best to drink small amounts of fluid more often. Drinking too much at one time can make vomiting worse. Refer to the home care instructions below. Severe dehydration needs to be treated at the hospital where you will probably be given intravenous (IV) fluids that contain water and electrolytes. HOME CARE INSTRUCTIONS   Ask your caregiver about specific rehydration instructions.  Drink enough fluids to keep your urine clear or pale yellow.  Drink small amounts frequently if you have nausea and vomiting.  Eat as you normally do.  Avoid:  Foods or drinks high in sugar.  Carbonated  drinks.  Juice.  Extremely hot or cold fluids.  Drinks with caffeine.  Fatty, greasy foods.  Alcohol.  Tobacco.  Overeating.  Gelatin desserts.  Wash your hands well to avoid spreading bacteria and viruses.  Only take over-the-counter or prescription medicines for pain, discomfort, or fever as directed by your caregiver.  Ask your caregiver if you should continue all prescribed and over-the-counter medicines.  Keep all follow-up appointments with your caregiver. SEEK MEDICAL CARE IF:  You have abdominal pain and it increases or stays in one area (localizes).  You have a rash, stiff neck, or severe headache.  You are irritable, sleepy, or difficult to awaken.  You are weak, dizzy, or extremely thirsty. SEEK IMMEDIATE MEDICAL CARE IF:   You are unable to keep fluids down or you get worse despite treatment.  You have frequent episodes of vomiting or diarrhea.  You have blood or green matter (bile) in your vomit.  You have blood in your stool or your stool looks black and tarry.  You have not urinated in 6 to 8 hours, or you have only urinated a small amount of very dark urine.  You have a fever.  You faint. MAKE SURE YOU:   Understand these instructions.  Will watch your condition.  Will get help right away if you are not doing well or get worse. Document Released: 08/04/2005 Document Revised: 10/27/2011 Document Reviewed: 03/24/2011 ExitCare Patient Information 2015 [employer]. This information is not intended to replace advice given to you by your health care provider. Make sure you discuss any questions you have with your health care   provider.  

## 2015-04-05 NOTE — Telephone Encounter (Signed)
Sent msg to add IVF M-S all next week in close proximity to scheduled radiation treatments. Will contact pt once they are in.... KJ

## 2015-04-05 NOTE — Telephone Encounter (Signed)
Per staff message and POF I have scheduled appts. Advised scheduler of appts. JMW  

## 2015-04-06 ENCOUNTER — Ambulatory Visit
Admission: RE | Admit: 2015-04-06 | Discharge: 2015-04-06 | Disposition: A | Discharge status: Home / Self Care | Payer: Medicaid Other | Source: Ambulatory Visit | Attending: Radiation Oncology | Admitting: Radiation Oncology

## 2015-04-06 ENCOUNTER — Ambulatory Visit (HOSPITAL_BASED_OUTPATIENT_CLINIC_OR_DEPARTMENT_OTHER): Payer: Medicaid Other

## 2015-04-06 VITALS — BP 123/73 | HR 63 | Temp 97.1°F | Resp 18

## 2015-04-06 DIAGNOSIS — C321 Malignant neoplasm of supraglottis: Secondary | ICD-10-CM | POA: Diagnosis not present

## 2015-04-06 DIAGNOSIS — E43 Unspecified severe protein-calorie malnutrition: Secondary | ICD-10-CM

## 2015-04-06 DIAGNOSIS — Z510 Encounter for antineoplastic radiation therapy: Secondary | ICD-10-CM | POA: Diagnosis not present

## 2015-04-06 DIAGNOSIS — E860 Dehydration: Secondary | ICD-10-CM | POA: Diagnosis present

## 2015-04-06 DIAGNOSIS — I951 Orthostatic hypotension: Secondary | ICD-10-CM

## 2015-04-06 MED ORDER — SODIUM CHLORIDE 0.9 % IV SOLN
Freq: Once | INTRAVENOUS | Status: AC
  Administered 2015-04-06: 11:00:00 via INTRAVENOUS

## 2015-04-06 MED ORDER — HYDROMORPHONE HCL 4 MG/ML IJ SOLN
2.0000 mg | INTRAMUSCULAR | Status: DC | PRN
Start: 2015-04-06 — End: 2015-04-06
  Administered 2015-04-06: 2 mg via INTRAVENOUS

## 2015-04-06 MED ORDER — SODIUM CHLORIDE 0.9 % IJ SOLN
10.0000 mL | INTRAMUSCULAR | Status: DC | PRN
  Administered 2015-04-06: 10 mL
  Filled 2015-04-06: qty 10

## 2015-04-06 MED ORDER — HEPARIN SOD (PORK) LOCK FLUSH 100 UNIT/ML IV SOLN
500.0000 [IU] | Freq: Once | INTRAVENOUS | Status: AC | PRN
  Administered 2015-04-06: 500 [IU]
  Filled 2015-04-06: qty 5

## 2015-04-06 MED ORDER — HYDROMORPHONE HCL 4 MG/ML IJ SOLN
INTRAMUSCULAR | Status: AC
  Filled 2015-04-06: qty 1

## 2015-04-06 MED ORDER — SODIUM CHLORIDE 0.9 % IV SOLN
Freq: Once | INTRAVENOUS | Status: AC
  Administered 2015-04-06: 11:00:00 via INTRAVENOUS
  Filled 2015-04-06: qty 4

## 2015-04-06 MED ORDER — HYDROMORPHONE HCL 1 MG/ML IJ SOLN
2.0000 mg | INTRAMUSCULAR | Status: DC | PRN
  Filled 2015-04-06: qty 2

## 2015-04-06 NOTE — Patient Instructions (Signed)
Dehydration, Adult Dehydration is when you lose more fluids from the body than you take in. Vital organs like the kidneys, brain, and heart cannot function without a proper amount of fluids and salt. Any loss of fluids from the body can cause dehydration.  CAUSES   Vomiting.  Diarrhea.  Excessive sweating.  Excessive urine output.  Fever. SYMPTOMS  Mild dehydration  Thirst.  Dry lips.  Slightly dry mouth. Moderate dehydration  Very dry mouth.  Sunken eyes.  Skin does not bounce back quickly when lightly pinched and released.  Dark urine and decreased urine production.  Decreased tear production.  Headache. Severe dehydration  Very dry mouth.  Extreme thirst.  Rapid, weak pulse (more than 100 beats per minute at rest).  Cold hands and feet.  Not able to sweat in spite of heat and temperature.  Rapid breathing.  Blue lips.  Confusion and lethargy.  Difficulty being awakened.  Minimal urine production.  No tears. DIAGNOSIS  Your caregiver will diagnose dehydration based on your symptoms and your exam. Blood and urine tests will help confirm the diagnosis. The diagnostic evaluation should also identify the cause of dehydration. TREATMENT  Treatment of mild or moderate dehydration can often be done at home by increasing the amount of fluids that you drink. It is best to drink small amounts of fluid more often. Drinking too much at one time can make vomiting worse. Refer to the home care instructions below. Severe dehydration needs to be treated at the hospital where you will probably be given intravenous (IV) fluids that contain water and electrolytes. HOME CARE INSTRUCTIONS   Ask your caregiver about specific rehydration instructions.  Drink enough fluids to keep your urine clear or pale yellow.  Drink small amounts frequently if you have nausea and vomiting.  Eat as you normally do.  Avoid:  Foods or drinks high in sugar.  Carbonated  drinks.  Juice.  Extremely hot or cold fluids.  Drinks with caffeine.  Fatty, greasy foods.  Alcohol.  Tobacco.  Overeating.  Gelatin desserts.  Wash your hands well to avoid spreading bacteria and viruses.  Only take over-the-counter or prescription medicines for pain, discomfort, or fever as directed by your caregiver.  Ask your caregiver if you should continue all prescribed and over-the-counter medicines.  Keep all follow-up appointments with your caregiver. SEEK MEDICAL CARE IF:  You have abdominal pain and it increases or stays in one area (localizes).  You have a rash, stiff neck, or severe headache.  You are irritable, sleepy, or difficult to awaken.  You are weak, dizzy, or extremely thirsty. SEEK IMMEDIATE MEDICAL CARE IF:   You are unable to keep fluids down or you get worse despite treatment.  You have frequent episodes of vomiting or diarrhea.  You have blood or green matter (bile) in your vomit.  You have blood in your stool or your stool looks black and tarry.  You have not urinated in 6 to 8 hours, or you have only urinated a small amount of very dark urine.  You have a fever.  You faint. MAKE SURE YOU:   Understand these instructions.  Will watch your condition.  Will get help right away if you are not doing well or get worse. Document Released: 08/04/2005 Document Revised: 10/27/2011 Document Reviewed: 03/24/2011 ExitCare Patient Information 2015 [employer]. This information is not intended to replace advice given to you by your health care provider. Make sure you discuss any questions you have with your health care   provider.  

## 2015-04-06 NOTE — Telephone Encounter (Signed)
Lft msg for pt confirming chemo for 08/20 at 9 and to request an updated schedule when he comes in... Marvin [name]

## 2015-04-07 ENCOUNTER — Ambulatory Visit (HOSPITAL_BASED_OUTPATIENT_CLINIC_OR_DEPARTMENT_OTHER): Payer: Medicaid Other

## 2015-04-07 VITALS — BP 129/78 | HR 71 | Temp 97.8°F | Resp 16

## 2015-04-07 DIAGNOSIS — E43 Unspecified severe protein-calorie malnutrition: Secondary | ICD-10-CM | POA: Diagnosis not present

## 2015-04-07 DIAGNOSIS — C321 Malignant neoplasm of supraglottis: Secondary | ICD-10-CM | POA: Diagnosis not present

## 2015-04-07 MED ORDER — ALTEPLASE 2 MG IJ SOLR
2.0000 mg | Freq: Once | INTRAMUSCULAR | Status: DC | PRN
  Filled 2015-04-07: qty 2

## 2015-04-07 MED ORDER — SODIUM CHLORIDE 0.9 % IJ SOLN
10.0000 mL | INTRAMUSCULAR | Status: DC | PRN
  Administered 2015-04-07: 10 mL
  Filled 2015-04-07: qty 10

## 2015-04-07 MED ORDER — SODIUM CHLORIDE 0.9 % IV SOLN
Freq: Once | INTRAVENOUS | Status: AC
  Administered 2015-04-07: 09:00:00 via INTRAVENOUS

## 2015-04-07 MED ORDER — HEPARIN SOD (PORK) LOCK FLUSH 100 UNIT/ML IV SOLN
500.0000 [IU] | Freq: Once | INTRAVENOUS | Status: AC | PRN
  Administered 2015-04-07: 500 [IU]
  Filled 2015-04-07: qty 5

## 2015-04-07 MED ORDER — HEPARIN SOD (PORK) LOCK FLUSH 100 UNIT/ML IV SOLN
250.0000 [IU] | Freq: Once | INTRAVENOUS | Status: DC | PRN
  Filled 2015-04-07: qty 5

## 2015-04-07 NOTE — Patient Instructions (Signed)
Dehydration, Adult Dehydration is when you lose more fluids from the body than you take in. Vital organs like the kidneys, brain, and heart cannot function without a proper amount of fluids and salt. Any loss of fluids from the body can cause dehydration.  CAUSES   Vomiting.  Diarrhea.  Excessive sweating.  Excessive urine output.  Fever. SYMPTOMS  Mild dehydration  Thirst.  Dry lips.  Slightly dry mouth. Moderate dehydration  Very dry mouth.  Sunken eyes.  Skin does not bounce back quickly when lightly pinched and released.  Dark urine and decreased urine production.  Decreased tear production.  Headache. Severe dehydration  Very dry mouth.  Extreme thirst.  Rapid, weak pulse (more than 100 beats per minute at rest).  Cold hands and feet.  Not able to sweat in spite of heat and temperature.  Rapid breathing.  Blue lips.  Confusion and lethargy.  Difficulty being awakened.  Minimal urine production.  No tears. DIAGNOSIS  Your caregiver will diagnose dehydration based on your symptoms and your exam. Blood and urine tests will help confirm the diagnosis. The diagnostic evaluation should also identify the cause of dehydration. TREATMENT  Treatment of mild or moderate dehydration can often be done at home by increasing the amount of fluids that you drink. It is best to drink small amounts of fluid more often. Drinking too much at one time can make vomiting worse. Refer to the home care instructions below. Severe dehydration needs to be treated at the hospital where you will probably be given intravenous (IV) fluids that contain water and electrolytes. HOME CARE INSTRUCTIONS   Ask your caregiver about specific rehydration instructions.  Drink enough fluids to keep your urine clear or pale yellow.  Drink small amounts frequently if you have nausea and vomiting.  Eat as you normally do.  Avoid:  Foods or drinks high in sugar.  Carbonated  drinks.  Juice.  Extremely hot or cold fluids.  Drinks with caffeine.  Fatty, greasy foods.  Alcohol.  Tobacco.  Overeating.  Gelatin desserts.  Wash your hands well to avoid spreading bacteria and viruses.  Only take over-the-counter or prescription medicines for pain, discomfort, or fever as directed by your caregiver.  Ask your caregiver if you should continue all prescribed and over-the-counter medicines.  Keep all follow-up appointments with your caregiver. SEEK MEDICAL CARE IF:  You have abdominal pain and it increases or stays in one area (localizes).  You have a rash, stiff neck, or severe headache.  You are irritable, sleepy, or difficult to awaken.  You are weak, dizzy, or extremely thirsty. SEEK IMMEDIATE MEDICAL CARE IF:   You are unable to keep fluids down or you get worse despite treatment.  You have frequent episodes of vomiting or diarrhea.  You have blood or green matter (bile) in your vomit.  You have blood in your stool or your stool looks black and tarry.  You have not urinated in 6 to 8 hours, or you have only urinated a small amount of very dark urine.  You have a fever.  You faint. MAKE SURE YOU:   Understand these instructions.  Will watch your condition.  Will get help right away if you are not doing well or get worse. Document Released: 08/04/2005 Document Revised: 10/27/2011 Document Reviewed: 03/24/2011 ExitCare Patient Information 2015 [employer]. This information is not intended to replace advice given to you by your health care provider. Make sure you discuss any questions you have with your health care   provider.  

## 2015-04-07 NOTE — Progress Notes (Signed)
Driver returned to pick Marvin [name] up.  Patient ambulatory in no distress.

## 2015-04-09 ENCOUNTER — Encounter: Payer: Self-pay | Admitting: *Deleted

## 2015-04-09 ENCOUNTER — Ambulatory Visit
Admission: RE | Admit: 2015-04-09 | Discharge: 2015-04-09 | Disposition: A | Discharge status: Home / Self Care | Payer: Medicaid Other | Source: Ambulatory Visit | Attending: Radiation Oncology | Admitting: Radiation Oncology

## 2015-04-09 ENCOUNTER — Ambulatory Visit
Admission: RE | Admit: 2015-04-09 | Discharge: 2015-04-09 | Disposition: A | Discharge status: Home / Self Care | Payer: Self-pay | Source: Ambulatory Visit | Attending: Radiation Oncology | Admitting: Radiation Oncology

## 2015-04-09 ENCOUNTER — Ambulatory Visit (HOSPITAL_BASED_OUTPATIENT_CLINIC_OR_DEPARTMENT_OTHER): Payer: Medicaid Other

## 2015-04-09 VITALS — BP 79/55 | HR 84 | Temp 98.0°F | Resp 12 | Wt 139.5 lb

## 2015-04-09 VITALS — BP 134/90 | HR 61 | Temp 98.1°F | Resp 16

## 2015-04-09 DIAGNOSIS — E860 Dehydration: Secondary | ICD-10-CM

## 2015-04-09 DIAGNOSIS — I951 Orthostatic hypotension: Secondary | ICD-10-CM

## 2015-04-09 DIAGNOSIS — Z510 Encounter for antineoplastic radiation therapy: Secondary | ICD-10-CM | POA: Diagnosis not present

## 2015-04-09 DIAGNOSIS — C321 Malignant neoplasm of supraglottis: Secondary | ICD-10-CM

## 2015-04-09 DIAGNOSIS — E43 Unspecified severe protein-calorie malnutrition: Secondary | ICD-10-CM

## 2015-04-09 MED ORDER — SODIUM CHLORIDE 0.9 % IJ SOLN
10.0000 mL | INTRAMUSCULAR | Status: DC | PRN
  Administered 2015-04-09: 10 mL
  Filled 2015-04-09: qty 10

## 2015-04-09 MED ORDER — SODIUM CHLORIDE 0.9 % IV SOLN
Freq: Once | INTRAVENOUS | Status: AC
  Administered 2015-04-09: 12:00:00 via INTRAVENOUS
  Filled 2015-04-09: qty 4

## 2015-04-09 MED ORDER — SODIUM CHLORIDE 0.9 % IV SOLN
Freq: Once | INTRAVENOUS | Status: AC
  Administered 2015-04-09: 12:00:00 via INTRAVENOUS

## 2015-04-09 MED ORDER — HEPARIN SOD (PORK) LOCK FLUSH 100 UNIT/ML IV SOLN
500.0000 [IU] | Freq: Once | INTRAVENOUS | Status: AC | PRN
  Administered 2015-04-09: 500 [IU]
  Filled 2015-04-09: qty 5

## 2015-04-09 NOTE — Patient Instructions (Addendum)
Dehydration, Adult Dehydration is when you lose more fluids from the body than you take in. Vital organs like the kidneys, brain, and heart cannot function without a proper amount of fluids and salt. Any loss of fluids from the body can cause dehydration.  CAUSES   Vomiting.  Diarrhea.  Excessive sweating.  Excessive urine output.  Fever. SYMPTOMS  Mild dehydration  Thirst.  Dry lips.  Slightly dry mouth. Moderate dehydration  Very dry mouth.  Sunken eyes.  Skin does not bounce back quickly when lightly pinched and released.  Dark urine and decreased urine production.  Decreased tear production.  Headache. Severe dehydration  Very dry mouth.  Extreme thirst.  Rapid, weak pulse (more than 100 beats per minute at rest).  Cold hands and feet.  Not able to sweat in spite of heat and temperature.  Rapid breathing.  Blue lips.  Confusion and lethargy.  Difficulty being awakened.  Minimal urine production.  No tears. DIAGNOSIS  Your caregiver will diagnose dehydration based on your symptoms and your exam. Blood and urine tests will help confirm the diagnosis. The diagnostic evaluation should also identify the cause of dehydration. TREATMENT  Treatment of mild or moderate dehydration can often be done at home by increasing the amount of fluids that you drink. It is best to drink small amounts of fluid more often. Drinking too much at one time can make vomiting worse. Refer to the home care instructions below. Severe dehydration needs to be treated at the hospital where you will probably be given intravenous (IV) fluids that contain water and electrolytes. HOME CARE INSTRUCTIONS   Ask your caregiver about specific rehydration instructions.  Drink enough fluids to keep your urine clear or pale yellow.  Drink small amounts frequently if you have nausea and vomiting.  Eat as you normally do.  Avoid:  Foods or drinks high in sugar.  Carbonated  drinks.  Juice.  Extremely hot or cold fluids.  Drinks with caffeine.  Fatty, greasy foods.  Alcohol.  Tobacco.  Overeating.  Gelatin desserts.  Wash your hands well to avoid spreading bacteria and viruses.  Only take over-the-counter or prescription medicines for pain, discomfort, or fever as directed by your caregiver.  Ask your caregiver if you should continue all prescribed and over-the-counter medicines.  Keep all follow-up appointments with your caregiver. SEEK MEDICAL CARE IF:  You have abdominal pain and it increases or stays in one area (localizes).  You have a rash, stiff neck, or severe headache.  You are irritable, sleepy, or difficult to awaken.  You are weak, dizzy, or extremely thirsty. SEEK IMMEDIATE MEDICAL CARE IF:   You are unable to keep fluids down or you get worse despite treatment.  You have frequent episodes of vomiting or diarrhea.  You have blood or green matter (bile) in your vomit.  You have blood in your stool or your stool looks black and tarry.  You have not urinated in 6 to 8 hours, or you have only urinated a small amount of very dark urine.  You have a fever.  You faint. MAKE SURE YOU:   Understand these instructions.  Will watch your condition.  Will get help right away if you are not doing well or get worse. Document Released: 08/04/2005 Document Revised: 10/27/2011 Document Reviewed: 03/24/2011 Exit[employer] Patient Information 2015 [employer]. This information is not intended to replace advice given to you by your health care provider. Make sure you discuss any questions you have with your health care   provider.  

## 2015-04-09 NOTE — Progress Notes (Signed)
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  CBCT/MVCT images/Port film x-rays were reviewed.  The chart was checked.    Pain minimal - most w/ swallowing.  Eating and using PEG. IV fluids today.  Orthostatic today. Dizzy w/ standing. No chest pain.  WEIGHT/VS: Wt Readings from Last 3 Encounters:  04/09/15 139 lb 8 oz (63.277 kg)  04/03/15 143 lb (64.864 kg)  04/02/15 137 lb 14.4 oz (62.551 kg)       Physical Findings:    weight is 139 lb 8 oz (63.277 kg). His oral temperature is 98 F (36.7 C). His blood pressure is 79/55 and his pulse is 84. His respiration is 12 and oxygen saturation is 100%.  Orthostatic Standing Vital Signs: BP:66/42 P:165 Pox: 100% In a wheelchair. In no acute distress. Trach intact. Skin without irritation.  Oropharynx moist / no lesions. Pulse taken manually while sitting - 66 BPM; standing -- weak pulse ~100 BPM.  CBC    Component Value Date/Time   WBC 5.2 04/03/2015 0957   WBC 10.7* 03/06/2015 1030   RBC 3.12* 04/03/2015 0957   RBC 3.14* 03/06/2015 1030   HGB 10.2* 04/03/2015 0957   HGB 10.5* 03/06/2015 1030   HCT 30.0* 04/03/2015 0957   HCT 30.6* 03/06/2015 1030   PLT 142 04/03/2015 0957   PLT 518* 03/06/2015 1030   MCV 96.2 04/03/2015 0957   MCV 97.5 03/06/2015 1030   MCH 32.7 04/03/2015 0957   MCH 33.4 03/06/2015 1030   MCHC 34.0 04/03/2015 0957   MCHC 34.3 03/06/2015 1030   RDW 13.7 04/03/2015 0957   RDW 12.9 03/06/2015 1030   LYMPHSABS 1.0 04/03/2015 0957   LYMPHSABS 0.8 03/06/2015 1030   MONOABS 0.6 04/03/2015 0957   MONOABS 1.2* 03/06/2015 1030   EOSABS 0.1 04/03/2015 0957   EOSABS 0.1 03/06/2015 1030   BASOSABS 0.0 04/03/2015 0957   BASOSABS 0.0 03/06/2015 1030     CMP     Component Value Date/Time   NA 137 04/03/2015 0957   NA 135  02/14/2015 1439   K 4.0 04/03/2015 0957   K 4.2 02/14/2015 1439   CL 100* 02/14/2015 1439   CO2 28 04/03/2015 0957   CO2 24 02/14/2015 1439   GLUCOSE 106 04/03/2015 0957   GLUCOSE 81 02/14/2015 1439   BUN 10.1 04/03/2015 0957   BUN <5* 02/14/2015 1439   CREATININE 0.6* 04/03/2015 0957   CREATININE 0.65 02/22/2015 1731   CALCIUM 8.9 04/03/2015 0957   CALCIUM 8.5* 02/14/2015 1439   PROT 6.4 04/03/2015 0957   PROT 7.7 02/14/2015 1439   ALBUMIN 3.0* 04/03/2015 0957   ALBUMIN 3.3* 02/14/2015 1439   AST 19 04/03/2015 0957   AST 56* 02/14/2015 1439   ALT 21 04/03/2015 0957   ALT 27 02/14/2015 1439   ALKPHOS 113 04/03/2015 0957   ALKPHOS 113 02/14/2015 1439   BILITOT 0.35 04/03/2015 0957   BILITOT 0.4 02/14/2015 1439   GFRNONAA >60 02/22/2015 1731   GFRAA >60 02/22/2015 1731     Impression:  The patient is tolerating radiotherapy with  orthostatic hypotension .   Plan:  Continue radiotherapy as planned. Receiving daily IV fluids  Med/onc will continue to monitor hydrational needs.   Rx for carafate and lidocaine given for esophagitis - reiterated use of  these prn. -----------------------------------  [name], MD

## 2015-04-09 NOTE — Progress Notes (Signed)
PAIN: He rates his pain as a 3 on a scale of 0-10. intermittent and burning over throat when swallowing  SWALLOWING/DIET: Pt denies dysphagia. Pt reports a eating food PO three times a day, with GT feedings- formula-Jevity, 3 cans per day, with H20 flushes. Peg tube site appearance-yellow crusting noted at site. Cleansed with NS and DD applied. Oral exam reveals mucous membranes moist with yellow and tenacious sputum. Trach site intact. BOWEL: Pt reports Nausea and Vomiting, Constipation-last bowel movement yesterday. SKIN: Skin exam reveals Dryness. Pt continues to apply Biafine as directed.  WEIGHT/VS: Wt Readings from Last 3 Encounters:  04/09/15 139 lb 8 oz (63.277 kg)  04/03/15 143 lb (64.864 kg)  04/02/15 137 lb 14.4 oz (62.551 kg)   BP 79/55 mmHg  Pulse 84  Temp(Src) 98 F (36.7 C) (Oral)  Resp 12  Wt 139 lb 8 oz (63.277 kg)  SpO2 100% Orthostatic Standing Vital Signs: BP:66/42 P:165 Pox: 100%

## 2015-04-10 ENCOUNTER — Other Ambulatory Visit: Payer: Self-pay | Admitting: Hematology and Oncology

## 2015-04-10 ENCOUNTER — Telehealth: Payer: Self-pay | Admitting: *Deleted

## 2015-04-10 ENCOUNTER — Encounter: Payer: Self-pay | Admitting: Hematology and Oncology

## 2015-04-10 ENCOUNTER — Ambulatory Visit (HOSPITAL_BASED_OUTPATIENT_CLINIC_OR_DEPARTMENT_OTHER): Payer: Medicaid Other | Admitting: Hematology and Oncology

## 2015-04-10 ENCOUNTER — Ambulatory Visit (HOSPITAL_BASED_OUTPATIENT_CLINIC_OR_DEPARTMENT_OTHER): Payer: Medicaid Other

## 2015-04-10 ENCOUNTER — Other Ambulatory Visit (HOSPITAL_BASED_OUTPATIENT_CLINIC_OR_DEPARTMENT_OTHER): Payer: Medicaid Other

## 2015-04-10 ENCOUNTER — Ambulatory Visit
Admission: RE | Admit: 2015-04-10 | Discharge: 2015-04-10 | Disposition: A | Discharge status: Home / Self Care | Payer: Medicaid Other | Source: Ambulatory Visit | Attending: Radiation Oncology | Admitting: Radiation Oncology

## 2015-04-10 VITALS — BP 102/70 | HR 86 | Temp 98.5°F | Resp 18 | Ht 73.0 in | Wt 141.5 lb

## 2015-04-10 DIAGNOSIS — Z510 Encounter for antineoplastic radiation therapy: Secondary | ICD-10-CM | POA: Diagnosis not present

## 2015-04-10 DIAGNOSIS — I951 Orthostatic hypotension: Secondary | ICD-10-CM | POA: Insufficient documentation

## 2015-04-10 DIAGNOSIS — C321 Malignant neoplasm of supraglottis: Secondary | ICD-10-CM

## 2015-04-10 DIAGNOSIS — R110 Nausea: Secondary | ICD-10-CM | POA: Diagnosis not present

## 2015-04-10 DIAGNOSIS — E43 Unspecified severe protein-calorie malnutrition: Secondary | ICD-10-CM

## 2015-04-10 DIAGNOSIS — K1231 Oral mucositis (ulcerative) due to antineoplastic therapy: Secondary | ICD-10-CM

## 2015-04-10 DIAGNOSIS — I959 Hypotension, unspecified: Secondary | ICD-10-CM

## 2015-04-10 DIAGNOSIS — Z720 Tobacco use: Secondary | ICD-10-CM

## 2015-04-10 DIAGNOSIS — Z931 Gastrostomy status: Secondary | ICD-10-CM

## 2015-04-10 DIAGNOSIS — T451X5A Adverse effect of antineoplastic and immunosuppressive drugs, initial encounter: Secondary | ICD-10-CM

## 2015-04-10 LAB — CBC WITH DIFFERENTIAL/PLATELET
BASO%: 0.5 % (ref 0.0–2.0)
Basophils Absolute: 0 10*3/uL (ref 0.0–0.1)
EOS%: 4.2 % (ref 0.0–7.0)
Eosinophils Absolute: 0.1 10*3/uL (ref 0.0–0.5)
HEMATOCRIT: 30.9 % — AB (ref 38.4–49.9)
HEMOGLOBIN: 10.3 g/dL — AB (ref 13.0–17.1)
LYMPH#: 0.7 10*3/uL — AB (ref 0.9–3.3)
LYMPH%: 22 % (ref 14.0–49.0)
MCH: 32 pg (ref 27.2–33.4)
MCHC: 33.5 g/dL (ref 32.0–36.0)
MCV: 95.7 fL (ref 79.3–98.0)
MONO#: 0.8 10*3/uL (ref 0.1–0.9)
MONO%: 25.9 % — ABNORMAL HIGH (ref 0.0–14.0)
NEUT%: 47.4 % (ref 39.0–75.0)
NEUTROS ABS: 1.5 10*3/uL (ref 1.5–6.5)
PLATELETS: 229 10*3/uL (ref 140–400)
RBC: 3.22 10*6/uL — ABNORMAL LOW (ref 4.20–5.82)
RDW: 14.6 % (ref 11.0–14.6)
WBC: 3.1 10*3/uL — AB (ref 4.0–10.3)

## 2015-04-10 LAB — COMPREHENSIVE METABOLIC PANEL (CC13)
ALBUMIN: 3.1 g/dL — AB (ref 3.5–5.0)
ALK PHOS: 114 U/L (ref 40–150)
ALT: 12 U/L (ref 0–55)
ANION GAP: 8 meq/L (ref 3–11)
AST: 17 U/L (ref 5–34)
BUN: 9.3 mg/dL (ref 7.0–26.0)
CO2: 29 mEq/L (ref 22–29)
Calcium: 9.4 mg/dL (ref 8.4–10.4)
Chloride: 103 mEq/L (ref 98–109)
Creatinine: 0.7 mg/dL (ref 0.7–1.3)
GLUCOSE: 80 mg/dL (ref 70–140)
POTASSIUM: 4.8 meq/L (ref 3.5–5.1)
SODIUM: 140 meq/L (ref 136–145)
Total Bilirubin: 0.29 mg/dL (ref 0.20–1.20)
Total Protein: 6.5 g/dL (ref 6.4–8.3)

## 2015-04-10 MED ORDER — HYDROMORPHONE HCL 4 MG/ML IJ SOLN
INTRAMUSCULAR | Status: AC
  Filled 2015-04-10: qty 1

## 2015-04-10 MED ORDER — SODIUM CHLORIDE 0.9 % IJ SOLN
10.0000 mL | INTRAMUSCULAR | Status: DC | PRN
  Administered 2015-04-10: 10 mL
  Filled 2015-04-10: qty 10

## 2015-04-10 MED ORDER — HEPARIN SOD (PORK) LOCK FLUSH 100 UNIT/ML IV SOLN
500.0000 [IU] | Freq: Once | INTRAVENOUS | Status: AC | PRN
  Administered 2015-04-10: 500 [IU]
  Filled 2015-04-10: qty 5

## 2015-04-10 MED ORDER — HYDROMORPHONE HCL 1 MG/ML IJ SOLN
2.0000 mg | INTRAMUSCULAR | Status: DC | PRN
  Administered 2015-04-10: 2 mg via INTRAVENOUS
  Filled 2015-04-10: qty 2

## 2015-04-10 MED ORDER — SODIUM CHLORIDE 0.9 % IV SOLN
Freq: Once | INTRAVENOUS | Status: AC
  Administered 2015-04-10: 11:00:00 via INTRAVENOUS

## 2015-04-10 MED ORDER — ONDANSETRON HCL 40 MG/20ML IJ SOLN
Freq: Once | INTRAMUSCULAR | Status: AC
  Administered 2015-04-10: 12:00:00 via INTRAVENOUS
  Filled 2015-04-10: qty 4

## 2015-04-10 NOTE — Progress Notes (Signed)
[facility] OFFICE PROGRESS NOTE  Patient Care Team: No Pcp Per Patient as PCP - General (General Practice) [name], MD as Consulting Physician (Otolaryngology) [name], MD as Attending Physician (Radiation Oncology) [name], MD as Consulting Physician (Hematology and Oncology) Leota Sauers, RN as Oncology Nurse Navigator  SUMMARY OF ONCOLOGIC HISTORY: Oncology History   Carcinoma of supraglottis   Staging form: Larynx - Supraglottis, AJCC 7th Edition     Clinical stage from 03/13/2015: Stage IVA (T3, N2b, M0) - Signed by [name], MD on 03/13/2015       Carcinoma of supraglottis   01/22/2015 Imaging CT scan showed enhancing infiltrative mass centered at the right piriform sinus with bilateral neck lymphadenopathy   02/22/2015 - 03/08/2015 Hospital Admission He was admitted to the hospital for management of advanced supraglottic cancer status post tracheostomy, biopsy, dental extraction, placement of feeding tube and subsequent discharge to skilled nursing facility   02/22/2015 Pathology Results Accession: [id number] 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   03/09/2015 PET scan 1)  Right piriform sinus mass with right level 2 nodal metastasis.  2)  Hypermetabolic thoracic nodes which are at least partially felt to be reactive.  3)  No evidence of subdiaphragmatic metastatic disease.    03/16/2015 Procedure He has placement of port   03/21/2015 -  Chemotherapy He received high dose cisplatin   03/21/2015 -  Radiation Therapy He received radiation treatment with chemotherapy    INTERVAL HISTORY: Please see below for problem oriented charting. He is seen prior to cycle 2 of chemotherapy. The mucositis pain is well controlled with current pain medication regimen. He had some nausea but no further vomiting. He finds the IV fluid supportive care very  helpful. He denies any dizziness or fainting episode. His blood pressure usually runs low at the beginning of the week, improves at the end of each week. He denies hearing loss of peripheral neuropathy. He is able to swallow some soft diet.  REVIEW OF SYSTEMS:   Constitutional: Denies fevers, chills  Eyes: Denies blurriness of vision Respiratory: Denies cough, dyspnea or wheezes Cardiovascular: Denies palpitation, chest discomfort or lower extremity swelling Skin: Denies abnormal skin rashes Lymphatics: Denies new lymphadenopathy or easy bruising Neurological:Denies numbness, tingling or new weaknesses Behavioral/Psych: Mood is stable, no new changes  All other systems were reviewed with the patient and are negative.  I have reviewed the past medical history, past surgical history, social history and family history with the patient and they are unchanged from previous note.  ALLERGIES:  has No Known Allergies.  MEDICATIONS:  Current Outpatient Prescriptions  Medication Sig Dispense Refill  . bisacodyl (DULCOLAX) 10 MG suppository Place 10 mg rectally as needed for moderate constipation.    . citalopram (CELEXA) 20 MG tablet 20 mg by PEG Tube route daily.    Marland Kitchen emollient (BIAFINE) cream Apply topically 2 (two) times daily.    . fentaNYL (DURAGESIC - DOSED MCG/HR) 25 MCG/HR patch Place 1 patch (25 mcg total) onto the skin every 3 (three) days. 5 patch 0  . folic acid (FOLVITE) 1 MG tablet Take 1 tablet (1 mg total) by mouth daily. 30 tablet 0  . HYDROcodone-acetaminophen (HYCET) 7.5-325 mg/15 ml solution Take 15 mLs by mouth 4 (four) times daily as needed for moderate pain. 120 mL 0  . ibuprofen (ADVIL,MOTRIN) 100 MG/5ML suspension Take 400 mg by mouth every  4 (four) hours as needed for moderate pain (given 80ml (400mg ) by G- tube q 4 hours as needed for Moderate pain).    Marland Kitchen lidocaine (XYLOCAINE) 2 % solution Mix 1 part 2%viscous lidocaine,1part H2O.Swish and/or swallow 93ml of this  mixture,79min before meals and at bedtime, up to QID 100 mL 5  . lidocaine-prilocaine (EMLA) cream Apply 1 application topically as needed (FOR PORT ACCESS).    . LORazepam (ATIVAN) 1 MG tablet Take 0.5-1 mg by mouth 3 (three) times daily. TAKE 1 MG FOR 7 DAYS AND WILL CHANGE TO 1/2 TABLET (0.5MG ) ON 03/20/2015    . magnesium hydroxide (MILK OF MAGNESIA) 400 MG/5ML suspension Take 30 mLs by mouth daily as needed for mild constipation.    Marland Kitchen morphine (ROXANOL) 20 MG/ML concentrated solution Take 1 mL (20 mg total) by mouth every 2 (two) hours as needed for moderate pain or severe pain. 240 mL 0  . Multiple Vitamin (MULTIVITAMIN WITH MINERALS) TABS tablet Take 1 tablet by mouth daily. 30 tablet 0  . nicotine (NICODERM CQ - DOSED IN MG/24 HOURS) 21 mg/24hr patch Place 1 patch (21 mg total) onto the skin daily. 28 patch 0  . Nutritional Supplements (FEEDING SUPPLEMENT, JEVITY 1.2 CAL,) LIQD Give 1 can Jevity 1.2 via PEG TID between meals with 125 ml free water before and after bolus feeding.  In addition, give 250 cc free water TID with meals. 711 mL 0  . omeprazole (PRILOSEC) 20 MG capsule 20 mg by PEG Tube route daily.    . ondansetron (ZOFRAN) 8 MG tablet 8 mg by PEG Tube route every 8 (eight) hours as needed for nausea or vomiting.    . pravastatin (PRAVACHOL) 20 MG tablet 20 mg by PEG Tube route daily.    . prochlorperazine (COMPAZINE) 10 MG tablet 10 mg by PEG Tube route every 6 (six) hours as needed for nausea or vomiting.    . sodium fluoride (PREVIDENT 5000 PLUS) 1.1 % CREA dental cream Apply thin ribbon of cream to tooth brush. Brush teeth for 2 minutes. Spit out excess-DO NOT swallow. DO NOT rinse afterwards. Repeat nightly. 1 Tube prn  . sucralfate (CARAFATE) 1 G tablet Dissolve 1 tablet in 60ml H2O and swallow up to QID,PRN sore throat. 60 tablet 5  . thiamine 100 MG tablet Take 1 tablet (100 mg total) by mouth daily. 30 tablet 0  . ibuprofen (ADVIL,MOTRIN) 100 MG/5ML suspension Take 200 mg by  mouth every 4 (four) hours as needed for mild pain.      No current facility-administered medications for this visit.   Facility-Administered Medications Ordered in Other Visits  Medication Dose Route Frequency Provider Last Rate Last Dose  . ondansetron (ZOFRAN) 8 mg in sodium chloride 0.9 % 50 mL IVPB   Intravenous Once [name], MD        PHYSICAL EXAMINATION: ECOG PERFORMANCE STATUS: 1 - Symptomatic but completely ambulatory  Filed Vitals:   04/10/15 1027  BP: 102/70  Pulse: 86  Temp: 98.5 F (36.9 C)  Resp: 18   Filed Weights   04/10/15 1027  Weight: 141 lb 8 oz (64.184 kg)    GENERAL:alert, no distress and comfortable. He is sitting on the wheelchair SKIN: skin color, texture, turgor are normal, no rashes or significant lesions EYES: normal, Conjunctiva are pink and non-injected, sclera clear OROPHARYNX:no exudate, no erythema and lips, buccal mucosa, and tongue normal . No thrush NECK: supple, tracheostomy site looks clean LYMPH:  no palpable lymphadenopathy in the cervical,  axillary or inguinal. Prior palpable lymphadenopathy has resolved LUNGS: clear to auscultation and percussion with normal breathing effort HEART: regular rate & rhythm and no murmurs and no lower extremity edema ABDOMEN:abdomen soft, non-tender and normal bowel sounds. Feeding tube site looks okay Musculoskeletal:no cyanosis of digits and no clubbing  NEURO: alert & oriented x 3 with fluent speech, no focal motor/sensory deficits  LABORATORY DATA:  I have reviewed the data as listed    Component Value Date/Time   NA 140 04/10/2015 1012   NA 135 02/14/2015 1439   K 4.8 04/10/2015 1012   K 4.2 02/14/2015 1439   CL 100* 02/14/2015 1439   CO2 29 04/10/2015 1012   CO2 24 02/14/2015 1439   GLUCOSE 80 04/10/2015 1012   GLUCOSE 81 02/14/2015 1439   BUN 9.3 04/10/2015 1012   BUN <5* 02/14/2015 1439   CREATININE 0.7 04/10/2015 1012   CREATININE 0.65 02/22/2015 1731   CALCIUM 9.4 04/10/2015  1012   CALCIUM 8.5* 02/14/2015 1439   PROT 6.5 04/10/2015 1012   PROT 7.7 02/14/2015 1439   ALBUMIN 3.1* 04/10/2015 1012   ALBUMIN 3.3* 02/14/2015 1439   AST 17 04/10/2015 1012   AST 56* 02/14/2015 1439   ALT 12 04/10/2015 1012   ALT 27 02/14/2015 1439   ALKPHOS 114 04/10/2015 1012   ALKPHOS 113 02/14/2015 1439   BILITOT 0.29 04/10/2015 1012   BILITOT 0.4 02/14/2015 1439   GFRNONAA >60 02/22/2015 1731   GFRAA >60 02/22/2015 1731    No results found for: SPEP, UPEP  Lab Results  Component Value Date   WBC 3.1* 04/10/2015   NEUTROABS 1.5 04/10/2015   HGB 10.3* 04/10/2015   HCT 30.9* 04/10/2015   MCV 95.7 04/10/2015   PLT 229 04/10/2015      Chemistry      Component Value Date/Time   NA 140 04/10/2015 1012   NA 135 02/14/2015 1439   K 4.8 04/10/2015 1012   K 4.2 02/14/2015 1439   CL 100* 02/14/2015 1439   CO2 29 04/10/2015 1012   CO2 24 02/14/2015 1439   BUN 9.3 04/10/2015 1012   BUN <5* 02/14/2015 1439   CREATININE 0.7 04/10/2015 1012   CREATININE 0.65 02/22/2015 1731      Component Value Date/Time   CALCIUM 9.4 04/10/2015 1012   CALCIUM 8.5* 02/14/2015 1439   ALKPHOS 114 04/10/2015 1012   ALKPHOS 113 02/14/2015 1439   AST 17 04/10/2015 1012   AST 56* 02/14/2015 1439   ALT 12 04/10/2015 1012   ALT 27 02/14/2015 1439   BILITOT 0.29 04/10/2015 1012   BILITOT 0.4 02/14/2015 1439     ASSESSMENT & PLAN:  Carcinoma of supraglottis He tolerated treatment very well with objective response to treatment, with reduction of the lymphadenopathy. I will continue low-dose chemotherapy tomorrow for cycle 2 of therapy and will continue aggressive IV fluid support for this patient  Protein-calorie malnutrition, severe He has lost a lot of weight due to dysphagia. We will get him follow with speech and language therapist and follow up with nutritionist. He is currently dependent on tube feeds long-term but is able to tolerate some oral diet. He will continue close  follow-up with dietitian    Hypotension, postural He has profound low blood pressure every week at the beginning of the week. With aggressive IV fluid support, his blood pressure improved at the end of the week. Clinically, he is not symptomatic. I encouraged patient to increase oral fluid as tolerated  Chemotherapy-induced  nausea He has started to experience side effects with nausea. As mentioned above, I will start him on IV fluids daily along with IV anti-emetics.    Mucositis due to chemotherapy His pain is well controlled. He will continue on fentanyl patch and Roxanol for pain control. He is on regular laxatives regimen to prevent constipation.  S/P gastrostomy His feeding tube site looks clean. He is dependent on feeding tube for nutritional needs.  Tobacco abuse He denies recent smoking. I have given him a letter to give to the nursing home to start weaning him off his nicotine patch   No orders of the defined types were placed in this encounter.   All questions were answered. The patient knows to call the clinic with any problems, questions or concerns. No barriers to learning was detected. I spent 30 minutes counseling the patient face to face. The total time spent in the appointment was 40 minutes and more than 50% was on counseling and review of test results     [facility], [place], MD 04/10/2015 10:51 AM

## 2015-04-10 NOTE — Assessment & Plan Note (Signed)
He has started to experience side effects with nausea. As mentioned above, I will start him on IV fluids daily along with IV anti-emetics.

## 2015-04-10 NOTE — Patient Instructions (Signed)
Dehydration, Adult Dehydration is when you lose more fluids from the body than you take in. Vital organs like the kidneys, brain, and heart cannot function without a proper amount of fluids and salt. Any loss of fluids from the body can cause dehydration.  CAUSES   Vomiting.  Diarrhea.  Excessive sweating.  Excessive urine output.  Fever. SYMPTOMS  Mild dehydration  Thirst.  Dry lips.  Slightly dry mouth. Moderate dehydration  Very dry mouth.  Sunken eyes.  Skin does not bounce back quickly when lightly pinched and released.  Dark urine and decreased urine production.  Decreased tear production.  Headache. Severe dehydration  Very dry mouth.  Extreme thirst.  Rapid, weak pulse (more than 100 beats per minute at rest).  Cold hands and feet.  Not able to sweat in spite of heat and temperature.  Rapid breathing.  Blue lips.  Confusion and lethargy.  Difficulty being awakened.  Minimal urine production.  No tears. DIAGNOSIS  Your caregiver will diagnose dehydration based on your symptoms and your exam. Blood and urine tests will help confirm the diagnosis. The diagnostic evaluation should also identify the cause of dehydration. TREATMENT  Treatment of mild or moderate dehydration can often be done at home by increasing the amount of fluids that you drink. It is best to drink small amounts of fluid more often. Drinking too much at one time can make vomiting worse. Refer to the home care instructions below. Severe dehydration needs to be treated at the hospital where you will probably be given intravenous (IV) fluids that contain water and electrolytes. HOME CARE INSTRUCTIONS   Ask your caregiver about specific rehydration instructions.  Drink enough fluids to keep your urine clear or pale yellow.  Drink small amounts frequently if you have nausea and vomiting.  Eat as you normally do.  Avoid:  Foods or drinks high in sugar.  Carbonated  drinks.  Juice.  Extremely hot or cold fluids.  Drinks with caffeine.  Fatty, greasy foods.  Alcohol.  Tobacco.  Overeating.  Gelatin desserts.  Wash your hands well to avoid spreading bacteria and viruses.  Only take over-the-counter or prescription medicines for pain, discomfort, or fever as directed by your caregiver.  Ask your caregiver if you should continue all prescribed and over-the-counter medicines.  Keep all follow-up appointments with your caregiver. SEEK MEDICAL CARE IF:  You have abdominal pain and it increases or stays in one area (localizes).  You have a rash, stiff neck, or severe headache.  You are irritable, sleepy, or difficult to awaken.  You are weak, dizzy, or extremely thirsty. SEEK IMMEDIATE MEDICAL CARE IF:   You are unable to keep fluids down or you get worse despite treatment.  You have frequent episodes of vomiting or diarrhea.  You have blood or green matter (bile) in your vomit.  You have blood in your stool or your stool looks black and tarry.  You have not urinated in 6 to 8 hours, or you have only urinated a small amount of very dark urine.  You have a fever.  You faint. MAKE SURE YOU:   Understand these instructions.  Will watch your condition.  Will get help right away if you are not doing well or get worse. Document Released: 08/04/2005 Document Revised: 10/27/2011 Document Reviewed: 03/24/2011 ExitCare Patient Information 2015 [employer]. This information is not intended to replace advice given to you by your health care provider. Make sure you discuss any questions you have with your health care   provider.  

## 2015-04-10 NOTE — Assessment & Plan Note (Signed)
His pain is well controlled. He will continue on fentanyl patch and Roxanol for pain control. He is on regular laxatives regimen to prevent constipation.

## 2015-04-10 NOTE — Assessment & Plan Note (Signed)
His feeding tube site looks clean. He is dependent on feeding tube for nutritional needs.

## 2015-04-10 NOTE — Progress Notes (Signed)
Patient requests that [facility] remain accessed for tomorrow's IVF.  Dressing changed and antimicrobial disc added.

## 2015-04-10 NOTE — Assessment & Plan Note (Signed)
He has lost a lot of weight due to dysphagia. We will get him follow with speech and language therapist and follow up with nutritionist. He is currently dependent on tube feeds long-term but is able to tolerate some oral diet. He will continue close follow-up with dietitian

## 2015-04-10 NOTE — Telephone Encounter (Signed)
Per staff message and POF I have scheduled appts. Advised scheduler of appts and to try [facility] for 9/6 and 9/8  JMW

## 2015-04-10 NOTE — Assessment & Plan Note (Signed)
He denies recent smoking. I have given him a letter to give to the nursing home to start weaning him off his nicotine patch

## 2015-04-10 NOTE — Assessment & Plan Note (Signed)
He tolerated treatment very well with objective response to treatment, with reduction of the lymphadenopathy. I will continue low-dose chemotherapy tomorrow for cycle 2 of therapy and will continue aggressive IV fluid support for this patient

## 2015-04-10 NOTE — Assessment & Plan Note (Signed)
He has profound low blood pressure every week at the beginning of the week. With aggressive IV fluid support, his blood pressure improved at the end of the week. Clinically, he is not symptomatic. I encouraged patient to increase oral fluid as tolerated

## 2015-04-11 ENCOUNTER — Ambulatory Visit (HOSPITAL_BASED_OUTPATIENT_CLINIC_OR_DEPARTMENT_OTHER): Payer: Medicaid Other

## 2015-04-11 ENCOUNTER — Telehealth: Payer: Self-pay | Admitting: *Deleted

## 2015-04-11 ENCOUNTER — Encounter: Payer: Self-pay | Admitting: *Deleted

## 2015-04-11 ENCOUNTER — Ambulatory Visit
Admission: RE | Admit: 2015-04-11 | Discharge: 2015-04-11 | Disposition: A | Discharge status: Home / Self Care | Payer: Medicaid Other | Source: Ambulatory Visit | Attending: Radiation Oncology | Admitting: Radiation Oncology

## 2015-04-11 VITALS — BP 122/77 | HR 67 | Temp 98.7°F | Resp 18

## 2015-04-11 DIAGNOSIS — C321 Malignant neoplasm of supraglottis: Secondary | ICD-10-CM | POA: Diagnosis not present

## 2015-04-11 DIAGNOSIS — E43 Unspecified severe protein-calorie malnutrition: Secondary | ICD-10-CM

## 2015-04-11 DIAGNOSIS — I959 Hypotension, unspecified: Secondary | ICD-10-CM | POA: Diagnosis not present

## 2015-04-11 DIAGNOSIS — R110 Nausea: Secondary | ICD-10-CM | POA: Diagnosis not present

## 2015-04-11 DIAGNOSIS — Z510 Encounter for antineoplastic radiation therapy: Secondary | ICD-10-CM | POA: Diagnosis not present

## 2015-04-11 MED ORDER — HYDROMORPHONE HCL 1 MG/ML IJ SOLN
2.0000 mg | INTRAMUSCULAR | Status: DC | PRN
  Administered 2015-04-11: 2 mg via INTRAVENOUS
  Filled 2015-04-11: qty 2

## 2015-04-11 MED ORDER — SODIUM CHLORIDE 0.9 % IJ SOLN
10.0000 mL | INTRAMUSCULAR | Status: DC | PRN
  Administered 2015-04-11: 10 mL
  Filled 2015-04-11: qty 10

## 2015-04-11 MED ORDER — SODIUM CHLORIDE 0.9 % IV SOLN
Freq: Once | INTRAVENOUS | Status: AC
  Administered 2015-04-11: 08:00:00 via INTRAVENOUS

## 2015-04-11 MED ORDER — HEPARIN SOD (PORK) LOCK FLUSH 100 UNIT/ML IV SOLN
500.0000 [IU] | Freq: Once | INTRAVENOUS | Status: AC | PRN
  Administered 2015-04-11: 500 [IU]
  Filled 2015-04-11: qty 5

## 2015-04-11 MED ORDER — SODIUM CHLORIDE 0.9 % IV SOLN
Freq: Once | INTRAVENOUS | Status: AC
  Administered 2015-04-11: 08:00:00 via INTRAVENOUS
  Filled 2015-04-11: qty 4

## 2015-04-11 MED ORDER — HYDROMORPHONE HCL 4 MG/ML IJ SOLN
INTRAMUSCULAR | Status: AC
  Filled 2015-04-11: qty 1

## 2015-04-11 NOTE — Progress Notes (Signed)
Lm on patient's VM for ivf appt on 9/6 and 9/8 will be done at [facility] @ 11:00 am each day.

## 2015-04-11 NOTE — Patient Instructions (Signed)

## 2015-04-11 NOTE — Telephone Encounter (Signed)
Entry error

## 2015-04-12 ENCOUNTER — Ambulatory Visit
Admission: RE | Admit: 2015-04-12 | Discharge: 2015-04-12 | Disposition: A | Discharge status: Home / Self Care | Payer: Medicaid Other | Source: Ambulatory Visit | Attending: Radiation Oncology | Admitting: Radiation Oncology

## 2015-04-12 ENCOUNTER — Ambulatory Visit (HOSPITAL_BASED_OUTPATIENT_CLINIC_OR_DEPARTMENT_OTHER): Payer: Medicaid Other

## 2015-04-12 VITALS — BP 118/77 | HR 60 | Temp 99.2°F | Resp 18

## 2015-04-12 DIAGNOSIS — C321 Malignant neoplasm of supraglottis: Secondary | ICD-10-CM | POA: Diagnosis not present

## 2015-04-12 DIAGNOSIS — E43 Unspecified severe protein-calorie malnutrition: Secondary | ICD-10-CM

## 2015-04-12 DIAGNOSIS — Z510 Encounter for antineoplastic radiation therapy: Secondary | ICD-10-CM | POA: Diagnosis not present

## 2015-04-12 MED ORDER — SODIUM CHLORIDE 0.9 % IJ SOLN
10.0000 mL | INTRAMUSCULAR | Status: DC | PRN
  Administered 2015-04-12: 10 mL
  Filled 2015-04-12: qty 10

## 2015-04-12 MED ORDER — HEPARIN SOD (PORK) LOCK FLUSH 100 UNIT/ML IV SOLN
500.0000 [IU] | Freq: Once | INTRAVENOUS | Status: AC | PRN
  Administered 2015-04-12: 500 [IU]
  Filled 2015-04-12: qty 5

## 2015-04-12 MED ORDER — SODIUM CHLORIDE 0.9 % IV SOLN
Freq: Once | INTRAVENOUS | Status: AC
  Administered 2015-04-12: 10:00:00 via INTRAVENOUS
  Filled 2015-04-12: qty 4

## 2015-04-12 MED ORDER — SODIUM CHLORIDE 0.9 % IV SOLN
Freq: Once | INTRAVENOUS | Status: AC
  Administered 2015-04-12: 10:00:00 via INTRAVENOUS

## 2015-04-12 NOTE — Patient Instructions (Signed)
Dehydration, Adult Dehydration is when you lose more fluids from the body than you take in. Vital organs like the kidneys, brain, and heart cannot function without a proper amount of fluids and salt. Any loss of fluids from the body can cause dehydration.  CAUSES   Vomiting.  Diarrhea.  Excessive sweating.  Excessive urine output.  Fever. SYMPTOMS  Mild dehydration  Thirst.  Dry lips.  Slightly dry mouth. Moderate dehydration  Very dry mouth.  Sunken eyes.  Skin does not bounce back quickly when lightly pinched and released.  Dark urine and decreased urine production.  Decreased tear production.  Headache. Severe dehydration  Very dry mouth.  Extreme thirst.  Rapid, weak pulse (more than 100 beats per minute at rest).  Cold hands and feet.  Not able to sweat in spite of heat and temperature.  Rapid breathing.  Blue lips.  Confusion and lethargy.  Difficulty being awakened.  Minimal urine production.  No tears. DIAGNOSIS  Your caregiver will diagnose dehydration based on your symptoms and your exam. Blood and urine tests will help confirm the diagnosis. The diagnostic evaluation should also identify the cause of dehydration. TREATMENT  Treatment of mild or moderate dehydration can often be done at home by increasing the amount of fluids that you drink. It is best to drink small amounts of fluid more often. Drinking too much at one time can make vomiting worse. Refer to the home care instructions below. Severe dehydration needs to be treated at the hospital where you will probably be given intravenous (IV) fluids that contain water and electrolytes. HOME CARE INSTRUCTIONS   Ask your caregiver about specific rehydration instructions.  Drink enough fluids to keep your urine clear or pale yellow.  Drink small amounts frequently if you have nausea and vomiting.  Eat as you normally do.  Avoid:  Foods or drinks high in sugar.  Carbonated  drinks.  Juice.  Extremely hot or cold fluids.  Drinks with caffeine.  Fatty, greasy foods.  Alcohol.  Tobacco.  Overeating.  Gelatin desserts.  Wash your hands well to avoid spreading bacteria and viruses.  Only take over-the-counter or prescription medicines for pain, discomfort, or fever as directed by your caregiver.  Ask your caregiver if you should continue all prescribed and over-the-counter medicines.  Keep all follow-up appointments with your caregiver. SEEK MEDICAL CARE IF:  You have abdominal pain and it increases or stays in one area (localizes).  You have a rash, stiff neck, or severe headache.  You are irritable, sleepy, or difficult to awaken.  You are weak, dizzy, or extremely thirsty. SEEK IMMEDIATE MEDICAL CARE IF:   You are unable to keep fluids down or you get worse despite treatment.  You have frequent episodes of vomiting or diarrhea.  You have blood or green matter (bile) in your vomit.  You have blood in your stool or your stool looks black and tarry.  You have not urinated in 6 to 8 hours, or you have only urinated a small amount of very dark urine.  You have a fever.  You faint. MAKE SURE YOU:   Understand these instructions.  Will watch your condition.  Will get help right away if you are not doing well or get worse. Document Released: 08/04/2005 Document Revised: 10/27/2011 Document Reviewed: 03/24/2011 ExitCare Patient Information 2015 [employer]. This information is not intended to replace advice given to you by your health care provider. Make sure you discuss any questions you have with your health care   provider.  

## 2015-04-13 ENCOUNTER — Ambulatory Visit
Admission: RE | Admit: 2015-04-13 | Discharge: 2015-04-13 | Disposition: A | Discharge status: Home / Self Care | Payer: Medicaid Other | Source: Ambulatory Visit | Attending: Radiation Oncology | Admitting: Radiation Oncology

## 2015-04-13 ENCOUNTER — Encounter: Payer: Self-pay | Admitting: Pharmacist

## 2015-04-13 ENCOUNTER — Ambulatory Visit (HOSPITAL_BASED_OUTPATIENT_CLINIC_OR_DEPARTMENT_OTHER): Payer: Medicaid Other

## 2015-04-13 VITALS — BP 98/56 | HR 74 | Temp 97.0°F | Resp 18

## 2015-04-13 DIAGNOSIS — Z5111 Encounter for antineoplastic chemotherapy: Secondary | ICD-10-CM

## 2015-04-13 DIAGNOSIS — E43 Unspecified severe protein-calorie malnutrition: Secondary | ICD-10-CM

## 2015-04-13 DIAGNOSIS — C321 Malignant neoplasm of supraglottis: Secondary | ICD-10-CM | POA: Diagnosis not present

## 2015-04-13 DIAGNOSIS — Z510 Encounter for antineoplastic radiation therapy: Secondary | ICD-10-CM | POA: Diagnosis not present

## 2015-04-13 MED ORDER — SODIUM CHLORIDE 0.9 % IV SOLN
Freq: Once | INTRAVENOUS | Status: AC
  Administered 2015-04-13: 11:00:00 via INTRAVENOUS
  Filled 2015-04-13: qty 5

## 2015-04-13 MED ORDER — HYDROCODONE-ACETAMINOPHEN 7.5-325 MG/15ML PO SOLN
15.0000 mL | Freq: Once | ORAL | Status: AC
  Administered 2015-04-13: 15 mL via ORAL

## 2015-04-13 MED ORDER — PALONOSETRON HCL INJECTION 0.25 MG/5ML
0.2500 mg | Freq: Once | INTRAVENOUS | Status: AC
  Administered 2015-04-13: 0.25 mg via INTRAVENOUS

## 2015-04-13 MED ORDER — CISPLATIN CHEMO INJECTION 100MG/100ML
100.0000 mg/m2 | Freq: Once | INTRAVENOUS | Status: AC
  Administered 2015-04-13: 176 mg via INTRAVENOUS
  Filled 2015-04-13: qty 176

## 2015-04-13 MED ORDER — POTASSIUM CHLORIDE 2 MEQ/ML IV SOLN
Freq: Once | INTRAVENOUS | Status: AC
  Administered 2015-04-13: 10:00:00 via INTRAVENOUS
  Filled 2015-04-13: qty 10

## 2015-04-13 MED ORDER — SODIUM CHLORIDE 0.9 % IJ SOLN
10.0000 mL | INTRAMUSCULAR | Status: DC | PRN
  Administered 2015-04-13: 10 mL
  Filled 2015-04-13: qty 10

## 2015-04-13 MED ORDER — HEPARIN SOD (PORK) LOCK FLUSH 100 UNIT/ML IV SOLN
500.0000 [IU] | Freq: Once | INTRAVENOUS | Status: AC | PRN
  Administered 2015-04-13: 500 [IU]
  Filled 2015-04-13: qty 5

## 2015-04-13 MED ORDER — PALONOSETRON HCL INJECTION 0.25 MG/5ML
INTRAVENOUS | Status: AC
  Filled 2015-04-13: qty 5

## 2015-04-13 MED ORDER — HYDROCODONE-ACETAMINOPHEN 7.5-325 MG/15ML PO SOLN
ORAL | Status: AC
  Filled 2015-04-13: qty 15

## 2015-04-13 MED ORDER — SODIUM CHLORIDE 0.9 % IV SOLN
Freq: Once | INTRAVENOUS | Status: AC
  Administered 2015-04-13: 10:00:00 via INTRAVENOUS

## 2015-04-13 NOTE — Progress Notes (Signed)
  Oncology Nurse Navigator Documentation   Navigator Encounter Type: Clinic/MDC (04/09/15 1340) Patient Visit Type: [facility] (04/09/15 1340)     To provide support and encouragement, care continuity and to assess for needs, met with patient during weekly UT with Dr. Isidore Moos.  He was in [facility] for safety. He reported pain with swallowing, he was re-educated on use of Lidocaine and Carafate for which he has Rx but has not used. I encouraged him to increase FW through PEG with each tube feeding to address ongoing orthostatics. He did not express any needs or concerns at this time, I encouraged him to contact me if that changes, he verbalized understanding.   [name], RN, BSN, [facility] [phone]                     Time Spent with Patient: 30 (04/09/15 1340)

## 2015-04-13 NOTE — Progress Notes (Signed)
Opened in Error.

## 2015-04-13 NOTE — Progress Notes (Signed)
Patient complains of nausea this morning, stating he was fed through his PEG tube to early at the facility he lives.   Patient to receive 1 liter of normal saline today instead of 2 liters due to patient receiving chemotherapy per Dr. Alvy Bimler.

## 2015-04-13 NOTE — Patient Instructions (Signed)
[facility] Discharge Instructions for Patients Receiving Chemotherapy  Today you received the following chemotherapy agents Cisplatin  To help prevent nausea and vomiting after your treatment, we encourage you to take your nausea medication as prescribed.  If you develop nausea and vomiting that is not controlled by your nausea medication, call the clinic.   BELOW ARE SYMPTOMS THAT SHOULD BE REPORTED IMMEDIATELY:  *FEVER GREATER THAN 100.5 F  *CHILLS WITH OR WITHOUT FEVER  NAUSEA AND VOMITING THAT IS NOT CONTROLLED WITH YOUR NAUSEA MEDICATION  *UNUSUAL SHORTNESS OF BREATH  *UNUSUAL BRUISING OR BLEEDING  TENDERNESS IN MOUTH AND THROAT WITH OR WITHOUT PRESENCE OF ULCERS  *URINARY PROBLEMS  *BOWEL PROBLEMS  UNUSUAL RASH Items with * indicate a potential emergency and should be followed up as soon as possible.  Feel free to call the clinic you have any questions or concerns. The clinic phone number is [phone].  Please show the [facility] at check-in to the Emergency Department and triage nurse.   Dehydration, Adult Dehydration is when you lose more fluids from the body than you take in. Vital organs like the kidneys, brain, and heart cannot function without a proper amount of fluids and salt. Any loss of fluids from the body can cause dehydration.  CAUSES   Vomiting.  Diarrhea.  Excessive sweating.  Excessive urine output.  Fever. SYMPTOMS  Mild dehydration  Thirst.  Dry lips.  Slightly dry mouth. Moderate dehydration  Very dry mouth.  Sunken eyes.  Skin does not bounce back quickly when lightly pinched and released.  Dark urine and decreased urine production.  Decreased tear production.  Headache. Severe dehydration  Very dry mouth.  Extreme thirst.  Rapid, weak pulse (more than 100 beats per minute at rest).  Cold hands and feet.  Not able to sweat in spite of heat and temperature.  Rapid breathing.  Blue  lips.  Confusion and lethargy.  Difficulty being awakened.  Minimal urine production.  No tears. DIAGNOSIS  Your caregiver will diagnose dehydration based on your symptoms and your exam. Blood and urine tests will help confirm the diagnosis. The diagnostic evaluation should also identify the cause of dehydration. TREATMENT  Treatment of mild or moderate dehydration can often be done at home by increasing the amount of fluids that you drink. It is best to drink small amounts of fluid more often. Drinking too much at one time can make vomiting worse. Refer to the home care instructions below. Severe dehydration needs to be treated at the hospital where you will probably be given intravenous (IV) fluids that contain water and electrolytes. HOME CARE INSTRUCTIONS   Ask your caregiver about specific rehydration instructions.  Drink enough fluids to keep your urine clear or pale yellow.  Drink small amounts frequently if you have nausea and vomiting.  Eat as you normally do.  Avoid:  Foods or drinks high in sugar.  Carbonated drinks.  Juice.  Extremely hot or cold fluids.  Drinks with caffeine.  Fatty, greasy foods.  Alcohol.  Tobacco.  Overeating.  Gelatin desserts.  Wash your hands well to avoid spreading bacteria and viruses.  Only take over-the-counter or prescription medicines for pain, discomfort, or fever as directed by your caregiver.  Ask your caregiver if you should continue all prescribed and over-the-counter medicines.  Keep all follow-up appointments with your caregiver. SEEK MEDICAL CARE IF:  You have abdominal pain and it increases or stays in one area (localizes).  You have a rash, stiff  neck, or severe headache.  You are irritable, sleepy, or difficult to awaken.  You are weak, dizzy, or extremely thirsty. SEEK IMMEDIATE MEDICAL CARE IF:   You are unable to keep fluids down or you get worse despite treatment.  You have frequent episodes of  vomiting or diarrhea.  You have blood or green matter (bile) in your vomit.  You have blood in your stool or your stool looks black and tarry.  You have not urinated in 6 to 8 hours, or you have only urinated a small amount of very dark urine.  You have a fever.  You faint. MAKE SURE YOU:   Understand these instructions.  Will watch your condition.  Will get help right away if you are not doing well or get worse. Document Released: 08/04/2005 Document Revised: 10/27/2011 Document Reviewed: 03/24/2011 [facility] Patient Information 2015 [place], [place]. This information is not intended to replace advice given to you by your health care provider. Make sure you discuss any questions you have with your health care provider.

## 2015-04-14 ENCOUNTER — Encounter (HOSPITAL_COMMUNITY): Payer: Self-pay

## 2015-04-14 ENCOUNTER — Ambulatory Visit (HOSPITAL_BASED_OUTPATIENT_CLINIC_OR_DEPARTMENT_OTHER): Payer: Medicaid Other

## 2015-04-14 ENCOUNTER — Emergency Department (HOSPITAL_COMMUNITY)
Admission: EM | Admit: 2015-04-14 | Discharge: 2015-04-14 | Disposition: A | Discharge status: Home / Self Care | Payer: Medicaid Other | Attending: Emergency Medicine | Admitting: Emergency Medicine

## 2015-04-14 VITALS — BP 127/83 | HR 77 | Temp 97.6°F | Resp 18

## 2015-04-14 DIAGNOSIS — Z87891 Personal history of nicotine dependence: Secondary | ICD-10-CM | POA: Diagnosis not present

## 2015-04-14 DIAGNOSIS — F419 Anxiety disorder, unspecified: Secondary | ICD-10-CM | POA: Diagnosis not present

## 2015-04-14 DIAGNOSIS — C321 Malignant neoplasm of supraglottis: Secondary | ICD-10-CM | POA: Diagnosis not present

## 2015-04-14 DIAGNOSIS — J9509 Other tracheostomy complication: Secondary | ICD-10-CM | POA: Diagnosis present

## 2015-04-14 DIAGNOSIS — Z79899 Other long term (current) drug therapy: Secondary | ICD-10-CM | POA: Diagnosis not present

## 2015-04-14 DIAGNOSIS — J449 Chronic obstructive pulmonary disease, unspecified: Secondary | ICD-10-CM | POA: Diagnosis not present

## 2015-04-14 DIAGNOSIS — K219 Gastro-esophageal reflux disease without esophagitis: Secondary | ICD-10-CM | POA: Diagnosis not present

## 2015-04-14 DIAGNOSIS — E43 Unspecified severe protein-calorie malnutrition: Secondary | ICD-10-CM

## 2015-04-14 MED ORDER — HEPARIN SOD (PORK) LOCK FLUSH 100 UNIT/ML IV SOLN
500.0000 [IU] | Freq: Once | INTRAVENOUS | Status: AC | PRN
  Administered 2015-04-14: 500 [IU]
  Filled 2015-04-14: qty 5

## 2015-04-14 MED ORDER — HYDROMORPHONE HCL 4 MG/ML IJ SOLN
INTRAMUSCULAR | Status: AC
  Filled 2015-04-14: qty 1

## 2015-04-14 MED ORDER — HYDROMORPHONE HCL 1 MG/ML IJ SOLN
2.0000 mg | INTRAMUSCULAR | Status: DC | PRN
  Administered 2015-04-14: 2 mg via INTRAVENOUS

## 2015-04-14 MED ORDER — SODIUM CHLORIDE 0.9 % IJ SOLN
10.0000 mL | INTRAMUSCULAR | Status: DC | PRN
  Administered 2015-04-14: 10 mL
  Filled 2015-04-14: qty 10

## 2015-04-14 MED ORDER — SODIUM CHLORIDE 0.9 % IV BOLUS (SEPSIS): 1000.0000 mL | Freq: Once | INTRAVENOUS | Status: DC

## 2015-04-14 MED ORDER — SODIUM CHLORIDE 0.9 % IV SOLN
Freq: Once | INTRAVENOUS | Status: AC
  Administered 2015-04-14: 10:00:00 via INTRAVENOUS

## 2015-04-14 MED ORDER — ONDANSETRON HCL 40 MG/20ML IJ SOLN
Freq: Once | INTRAMUSCULAR | Status: AC
  Administered 2015-04-14: 10:00:00 via INTRAVENOUS

## 2015-04-14 NOTE — ED Notes (Signed)
EDPA BEN at bedside.

## 2015-04-14 NOTE — ED Provider Notes (Signed)
CSN: [id number]     Arrival date & time 04/14/15  0756 History   First MD Initiated Contact with Patient 04/14/15 [phone]     Chief Complaint  Patient presents with  . Tracheostomy Tube Change     (Consider location/radiation/quality/duration/timing/severity/associated sxs/prior Treatment) HPI Marvin [name] is a 39 y.o. male with a history of supraglottic carcinoma and subsequent tracheostomy, comes in for evaluation of tracheostomy tube change. Patient states he typically comes to the [facility] Monday through Saturday to receive IV fluids. He reports he was on his way over here this morning at 7:30 AM when he coughed and his tracheostomy tube fell out. Patient brought tube with him. Denies any symptoms at this time. No chest pain, shortness of breath, throat pain, respiratory difficulty. No other aggravating or modifying factors.  Past Medical History  Diagnosis Date  . ETOH abuse   . Alcohol dependence 04/17/2012  . COPD (chronic obstructive pulmonary disease)   . Shortness of breath dyspnea     occ  . Anxiety   . GERD (gastroesophageal reflux disease)   . Seizures     one episode 09/2012, suspected related to ETOH withdrawal  . Anxiety disorder 03/12/2015   Past Surgical History  Procedure Laterality Date  . Knee surgery Left     kneecap -screws  . Appendectomy    . Tracheostomy tube placement N/A 02/22/2015    Procedure: TRACHEOSTOMY;  Surgeon: [name], MD;  Location: [facility];  Service: ENT;  Laterality: N/A;  awake tracheostomy  . Direct laryngoscopy N/A 02/22/2015    Procedure: DIRECT LARYNGOSCOPY;  Surgeon: [name], MD;  Location: [facility];  Service: ENT;  Laterality: N/A;  direct laryngoscopy with biopsy  . Multiple extractions with alveoloplasty N/A 03/05/2015    Procedure: Extraction of tooth #'s 1,2,8,9,15,16,17,18,31,32 with alveoloplasty, mandibular right lateral exostoses, and gross debridement of remaining teeth;  Surgeon: Lenn Cal, DDS;  Location: [facility];   Service: Oral Surgery;  Laterality: N/A;   Family History  Problem Relation Age of Onset  . Cancer Mother    Social History  Substance Use Topics  . Smoking status: Former Smoker -- 1.00 packs/day for 28 years    Types: Cigarettes    Quit date: 01/17/2015  . Smokeless tobacco: Never Used  . Alcohol Use: 0.0 oz/week    0 Standard drinks or equivalent per week     Comment: 6 -40-oz. beers daily    Review of Systems A 10 point review of systems was completed and was negative except for pertinent positives and negatives as mentioned in the history of present illness     Allergies  Review of patient's allergies indicates no known allergies.  Home Medications   Prior to Admission medications   Medication Sig Start Date End Date Taking? Authorizing Provider  bisacodyl (DULCOLAX) 10 MG suppository Place 10 mg rectally as needed for moderate constipation.   Yes Historical Provider, MD  fentaNYL (DURAGESIC - DOSED MCG/HR) 25 MCG/HR patch Place 1 patch (25 mcg total) onto the skin every 3 (three) days. 03/26/15  Yes [name], MD  folic acid (FOLVITE) 1 MG tablet Take 1 tablet (1 mg total) by mouth daily. Patient taking differently: 1 mg by PEG Tube route daily.  03/08/15  Yes [name], MD  HYDROcodone-acetaminophen (HYCET) 7.5-325 mg/15 ml solution Take 15 mLs by mouth 4 (four) times daily as needed for moderate pain. 03/08/15  Yes [name], MD  ibuprofen (ADVIL,MOTRIN) 100 MG/5ML suspension Take 200-400 mg  by mouth every 4 (four) hours as needed for mild pain.    Yes Historical Provider, MD  lidocaine (XYLOCAINE) 2 % solution Mix 1 part 2%viscous lidocaine,1part H2O.Swish and/or swallow 19ml of this mixture,32min before meals and at bedtime, up to QID 04/02/15  Yes [name], MD  lidocaine-prilocaine (EMLA) cream Apply 1 application topically as needed (FOR PORT ACCESS).   Yes Historical Provider, MD  LORazepam (ATIVAN) 1 MG tablet Take 0.5-1 mg by mouth 3 (three) times daily.  TAKE 1 MG FOR 7 DAYS AND WILL CHANGE TO 1/2 TABLET (0.5MG ) ON 03/20/2015   Yes Historical Provider, MD  magnesium hydroxide (MILK OF MAGNESIA) 400 MG/5ML suspension Take 30 mLs by mouth daily as needed for mild constipation.   Yes Historical Provider, MD  morphine (ROXANOL) 20 MG/ML concentrated solution Take 1 mL (20 mg total) by mouth every 2 (two) hours as needed for moderate pain or severe pain. 03/26/15  Yes [name], MD  Multiple Vitamin (MULTIVITAMIN WITH MINERALS) TABS tablet Take 1 tablet by mouth daily. Patient taking differently: 1 tablet by PEG Tube route daily.  03/08/15  Yes [name], MD  nicotine (NICODERM CQ - DOSED IN MG/24 HOURS) 21 mg/24hr patch Place 1 patch (21 mg total) onto the skin daily. 03/08/15  Yes [name], MD  Nutritional Supplements (FEEDING SUPPLEMENT, JEVITY 1.2 CAL,) LIQD Give 1 can Jevity 1.2 via PEG TID between meals with 125 ml free water before and after bolus feeding.  In addition, give 250 cc free water TID with meals. 03/27/15  Yes [name], MD  omeprazole (PRILOSEC) 20 MG capsule 20 mg by PEG Tube route daily.   Yes Historical Provider, MD  ondansetron (ZOFRAN) 8 MG tablet 8 mg by PEG Tube route every 8 (eight) hours as needed for nausea or vomiting.   Yes Historical Provider, MD  pravastatin (PRAVACHOL) 20 MG tablet 20 mg by PEG Tube route daily.   Yes Historical Provider, MD  [place]   Yes Historical Provider, MD  prochlorperazine (COMPAZINE) 10 MG tablet 10 mg by PEG Tube route every 6 (six) hours as needed for nausea or vomiting.   Yes Historical Provider, MD  sodium fluoride (PREVIDENT 5000 PLUS) 1.1 % CREA dental cream Apply thin ribbon of cream to tooth brush. Brush teeth for 2 minutes. Spit out excess-DO NOT swallow. DO NOT rinse afterwards. Repeat nightly. 04/03/15  Yes Lenn Cal, DDS  sucralfate (CARAFATE) 1 G tablet Dissolve 1 tablet in 28ml H2O and swallow up to QID,PRN sore throat. 04/02/15  Yes [name],  MD  thiamine 100 MG tablet Take 1 tablet (100 mg total) by mouth daily. 03/08/15  Yes [name], MD  citalopram (CELEXA) 20 MG tablet 20 mg by PEG Tube route daily.    Historical Provider, MD  emollient (BIAFINE) cream Apply topically 2 (two) times daily.    Historical Provider, MD   BP 108/74 mmHg  Pulse 78  Temp(Src) 98 F (36.7 C) (Oral)  Resp 16  Ht 6\' 1"  (1.854 m)  Wt 141 lb (63.957 kg)  BMI 18.61 kg/m2  SpO2 100% Physical Exam  Constitutional: He is oriented to person, place, and time. He appears well-developed and well-nourished.  HENT:  Head: Normocephalic and atraumatic.  Mouth/Throat: Oropharynx is clear and moist.  Eyes: Conjunctivae are normal. Pupils are equal, round, and reactive to light. Right eye exhibits no discharge. Left eye exhibits no discharge. No scleral icterus.  Neck: Neck supple.  Tracheostomy site appears  normal without infection or obstruction.  Cardiovascular: Normal rate, regular rhythm and normal heart sounds.   Pulmonary/Chest: Effort normal and breath sounds normal. No respiratory distress. He has no wheezes. He has no rales.  Abdominal: Soft. There is no tenderness.  Musculoskeletal: He exhibits no tenderness.  Neurological: He is alert and oriented to person, place, and time.  Cranial Nerves II-XII grossly intact  Skin: Skin is warm and dry. No rash noted.  Psychiatric: He has a normal mood and affect.  Nursing note and vitals reviewed.   ED Course  Procedures (including critical care time) Labs Review Labs Reviewed - No data to display  Imaging Review No results found. I have personally reviewed and evaluated these images and lab results as part of my medical decision-making.   EKG Interpretation None      Patient's tracheostomy is a size 6 Shiley uncuffed MDM  Vitals stable - WNL -afebrile Pt resting comfortably in ED. Patient's trach replaced by respiratory bedside without difficulty. States he feels much better. Patient  stable for discharge. Will DC so patient may follow-up for regularly scheduled appointment for IV fluids.  I discussed all relevant lab findings and imaging results with pt and they verbalized understanding. Discussed f/u with PCP within 48 hrs and return precautions, pt very amenable to plan. Prior to patient discharge, I discussed and reviewed this case with Dr.Steinl   Final diagnoses:  Other tracheostomy complication        [name], PA-C 04/14/15 [place], MD 04/15/15 [phone]

## 2015-04-14 NOTE — Progress Notes (Signed)
RT assessed PT. PT dislodged #6 Shiley Cuffless trach. PT currently on RA (sp02 96%, HR 72, RR 16, BBS clear-diminished- no respiratory distress at this time). PT refuses ATC at this time- states he uses at night but not needed. RT has all needed equipment at bedside- minus #6 Shiley Cuffless trach- RT asked secretary to order and notify RT as soon as it arrives- RN aware.

## 2015-04-14 NOTE — ED Notes (Signed)
TRACH PLACED BY RRT DEE. PT TOLERATED

## 2015-04-14 NOTE — Progress Notes (Signed)
RT and RN at bedside. All safety equipment at bedside per policy. RT inserted #6 Shiley Cuffless trach into stoma- uneventful. Sp02 remained stable (95-96% on RA), site within normal limits, no bleeding at this time (pre or post insertion, positive end tidal, clear-diminished breath sounds, trach tie and drain sponge applied. PT is able to speak and states he feels fine.

## 2015-04-14 NOTE — ED Notes (Signed)
Pt states in transfer Lucianne Lei here to get IV fluids. Last chemo yesterday. During transfer pt coughed and trach came out. Pt in no acute distress.

## 2015-04-14 NOTE — Discharge Instructions (Signed)
Your tracheostomy was replaced in the ED today. He will need to follow-up with your doctor for reevaluation within the week. Return to ED for new or worsening symptoms.

## 2015-04-16 ENCOUNTER — Ambulatory Visit
Admission: RE | Admit: 2015-04-16 | Discharge: 2015-04-16 | Disposition: A | Discharge status: Home / Self Care | Payer: Medicaid Other | Source: Ambulatory Visit | Attending: Radiation Oncology | Admitting: Radiation Oncology

## 2015-04-16 ENCOUNTER — Ambulatory Visit (HOSPITAL_BASED_OUTPATIENT_CLINIC_OR_DEPARTMENT_OTHER): Payer: Medicaid Other

## 2015-04-16 ENCOUNTER — Encounter: Payer: Self-pay | Admitting: Radiation Oncology

## 2015-04-16 ENCOUNTER — Ambulatory Visit: Payer: Medicaid Other | Admitting: Nutrition

## 2015-04-16 VITALS — BP 122/81 | HR 59 | Temp 98.6°F | Resp 16 | Wt 142.1 lb

## 2015-04-16 VITALS — BP 120/78 | HR 74 | Temp 98.2°F | Resp 18

## 2015-04-16 DIAGNOSIS — C321 Malignant neoplasm of supraglottis: Secondary | ICD-10-CM

## 2015-04-16 DIAGNOSIS — E43 Unspecified severe protein-calorie malnutrition: Secondary | ICD-10-CM

## 2015-04-16 DIAGNOSIS — R110 Nausea: Secondary | ICD-10-CM | POA: Diagnosis present

## 2015-04-16 DIAGNOSIS — Z510 Encounter for antineoplastic radiation therapy: Secondary | ICD-10-CM | POA: Diagnosis not present

## 2015-04-16 MED ORDER — SODIUM CHLORIDE 0.9 % IV SOLN
Freq: Once | INTRAVENOUS | Status: AC
  Administered 2015-04-16 (×2): via INTRAVENOUS

## 2015-04-16 MED ORDER — HEPARIN SOD (PORK) LOCK FLUSH 100 UNIT/ML IV SOLN
500.0000 [IU] | Freq: Once | INTRAVENOUS | Status: AC | PRN
  Administered 2015-04-16: 500 [IU]
  Filled 2015-04-16: qty 5

## 2015-04-16 MED ORDER — SODIUM CHLORIDE 0.9 % IV SOLN
Freq: Once | INTRAVENOUS | Status: AC
  Administered 2015-04-16: 09:00:00 via INTRAVENOUS
  Filled 2015-04-16: qty 4

## 2015-04-16 MED ORDER — SODIUM CHLORIDE 0.9 % IJ SOLN
10.0000 mL | INTRAMUSCULAR | Status: DC | PRN
  Administered 2015-04-16: 10 mL
  Filled 2015-04-16: qty 10

## 2015-04-16 NOTE — Progress Notes (Addendum)
Vitals stable. BP elevated now with hydration. Weight stable. Reports eating softer foods by mouth and thickened liquids. Reports pain associated with swallowing. Educated patient reference use of hycet and carafate. Denies pain at this time. Reports his trach was expelled over the weekend and he had to go to the ED to have a new one placed. Cleansed area around trach site with normal saline and applied new trach dressing. Pea size area of skin breakdown noted right side of trach. No hyperpigmentation or desquamation noted within treatment field. Reports using biafine cream as directed. Denies dry mouth. Reports instilling three cans of Jevity per day via PEG tube in addition to eating by mouth.   BP 122/81 mmHg  Pulse 59  Temp(Src) 98.6 F (37 C) (Oral)  Resp 16  Wt 142 lb 1.6 oz (64.456 kg)  SpO2 100% Wt Readings from Last 3 Encounters:  04/16/15 142 lb 1.6 oz (64.456 kg)  04/14/15 141 lb (63.957 kg)  04/10/15 141 lb 8 oz (64.184 kg)

## 2015-04-16 NOTE — Patient Instructions (Signed)
Dehydration, Adult Dehydration is when you lose more fluids from the body than you take in. Vital organs like the kidneys, brain, and heart cannot function without a proper amount of fluids and salt. Any loss of fluids from the body can cause dehydration.  CAUSES   Vomiting.  Diarrhea.  Excessive sweating.  Excessive urine output.  Fever. SYMPTOMS  Mild dehydration  Thirst.  Dry lips.  Slightly dry mouth. Moderate dehydration  Very dry mouth.  Sunken eyes.  Skin does not bounce back quickly when lightly pinched and released.  Dark urine and decreased urine production.  Decreased tear production.  Headache. Severe dehydration  Very dry mouth.  Extreme thirst.  Rapid, weak pulse (more than 100 beats per minute at rest).  Cold hands and feet.  Not able to sweat in spite of heat and temperature.  Rapid breathing.  Blue lips.  Confusion and lethargy.  Difficulty being awakened.  Minimal urine production.  No tears. DIAGNOSIS  Your caregiver will diagnose dehydration based on your symptoms and your exam. Blood and urine tests will help confirm the diagnosis. The diagnostic evaluation should also identify the cause of dehydration. TREATMENT  Treatment of mild or moderate dehydration can often be done at home by increasing the amount of fluids that you drink. It is best to drink small amounts of fluid more often. Drinking too much at one time can make vomiting worse. Refer to the home care instructions below. Severe dehydration needs to be treated at the hospital where you will probably be given intravenous (IV) fluids that contain water and electrolytes. HOME CARE INSTRUCTIONS   Ask your caregiver about specific rehydration instructions.  Drink enough fluids to keep your urine clear or pale yellow.  Drink small amounts frequently if you have nausea and vomiting.  Eat as you normally do.  Avoid:  Foods or drinks high in sugar.  Carbonated  drinks.  Juice.  Extremely hot or cold fluids.  Drinks with caffeine.  Fatty, greasy foods.  Alcohol.  Tobacco.  Overeating.  Gelatin desserts.  Wash your hands well to avoid spreading bacteria and viruses.  Only take over-the-counter or prescription medicines for pain, discomfort, or fever as directed by your caregiver.  Ask your caregiver if you should continue all prescribed and over-the-counter medicines.  Keep all follow-up appointments with your caregiver. SEEK MEDICAL CARE IF:  You have abdominal pain and it increases or stays in one area (localizes).  You have a rash, stiff neck, or severe headache.  You are irritable, sleepy, or difficult to awaken.  You are weak, dizzy, or extremely thirsty. SEEK IMMEDIATE MEDICAL CARE IF:   You are unable to keep fluids down or you get worse despite treatment.  You have frequent episodes of vomiting or diarrhea.  You have blood or green matter (bile) in your vomit.  You have blood in your stool or your stool looks black and tarry.  You have not urinated in 6 to 8 hours, or you have only urinated a small amount of very dark urine.  You have a fever.  You faint. MAKE SURE YOU:   Understand these instructions.  Will watch your condition.  Will get help right away if you are not doing well or get worse. Document Released: 08/04/2005 Document Revised: 10/27/2011 Document Reviewed: 03/24/2011 ExitCare Patient Information 2015 [employer]. This information is not intended to replace advice given to you by your health care provider. Make sure you discuss any questions you have with your health care   provider.  

## 2015-04-16 NOTE — Progress Notes (Signed)
Nutrition follow-up completed with patient during IV fluids. Patient states he has had nothing to eat or drink today.  He also did not receive his morning tube feeding prior to coming to the [facility]. Patient continues to follow a dysphagia 1 pudding thick liquid diet and is consuming 3 meals daily unless he is coming for treatments. Patient is scheduled to receive Jevity 1.2 via PEG 3 times a day. Patient has orders written for free water flushes 125 cc water before and after bolus feedings +250 cc of free water 3 times a day with meals providing an additional 1500 cc of free water. Weight improved and documented as 142 pounds.  Nutrition diagnosis: Unintended weight loss improved.  Estimated nutrition needs: 1800-2100 calories, 90-102 grams protein, 2.2 L fluid. Patient is receiving 2073 cc free water from tube feeding and free water flushes.  In addition patient receives free water from liquids on meal trays.  Intervention: Patient educated to continue increased meals following diet recommendations of the dysphasia 1 pudding thick liquid diet. Encouraged patient to continue Jevity 1.2 - 3 times a day with recommended free water flushes as tolerated. Encouraged patient to provide extra free water via PEG when able. Teach back method used.  Monitoring, evaluation, goals: Patient will continue to tolerate enteral feedings plus oral diet to promote weight gain and provide adequate hydration.  Next visit: Wednesday, September 7 in the infusion room.  **Disclaimer: This note was dictated with voice recognition software. Similar sounding words can inadvertently be transcribed and this note may contain transcription errors which may not have been corrected upon publication of note.**

## 2015-04-16 NOTE — Progress Notes (Signed)
   Weekly Management Note:  Outpatient    ICD-9-CM ICD-10-CM   1. 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  CBCT/MVCT images/Port film x-rays were reviewed.  The chart was checked.    Pain still minimal - most w/ swallowing.  Eating and using PEG. IV fluids today. Trach replaced at ED this weekend after being expelled    WEIGHT/VS: Wt Readings from Last 3 Encounters:  04/16/15 142 lb 1.6 oz (64.456 kg)  04/14/15 141 lb (63.957 kg)  04/10/15 141 lb 8 oz (64.184 kg)       Physical Findings:    weight is 142 lb 1.6 oz (64.456 kg). His oral temperature is 98.6 F (37 C). His blood pressure is 122/81 and his pulse is 59. His respiration is 16 and oxygen saturation is 100%.   In a wheelchair. In no acute distress. Trach intact. Skin intact, hyperpigmented .  Oropharynx moist / no lesions.    CBC    Component Value Date/Time   WBC 3.1* 04/10/2015 1012   WBC 10.7* 03/06/2015 1030   RBC 3.22* 04/10/2015 1012   RBC 3.14* 03/06/2015 1030   HGB 10.3* 04/10/2015 1012   HGB 10.5* 03/06/2015 1030   HCT 30.9* 04/10/2015 1012   HCT 30.6* 03/06/2015 1030   PLT 229 04/10/2015 1012   PLT 518* 03/06/2015 1030   MCV 95.7 04/10/2015 1012   MCV 97.5 03/06/2015 1030   MCH 32.0 04/10/2015 1012   MCH 33.4 03/06/2015 1030   MCHC 33.5 04/10/2015 1012   MCHC 34.3 03/06/2015 1030   RDW 14.6 04/10/2015 1012   RDW 12.9 03/06/2015 1030   LYMPHSABS 0.7* 04/10/2015 1012   LYMPHSABS 0.8 03/06/2015 1030   MONOABS 0.8 04/10/2015 1012   MONOABS 1.2* 03/06/2015 1030   EOSABS 0.1 04/10/2015 1012   EOSABS 0.1 03/06/2015 1030   BASOSABS 0.0 04/10/2015 1012   BASOSABS 0.0 03/06/2015 1030     CMP     Component Value Date/Time   NA 140 04/10/2015 1012   NA 135 02/14/2015 1439   K 4.8 04/10/2015 1012   K 4.2 02/14/2015 1439   CL 100* 02/14/2015 1439   CO2 29 04/10/2015 1012   CO2  24 02/14/2015 1439   GLUCOSE 80 04/10/2015 1012   GLUCOSE 81 02/14/2015 1439   BUN 9.3 04/10/2015 1012   BUN <5* 02/14/2015 1439   CREATININE 0.7 04/10/2015 1012   CREATININE 0.65 02/22/2015 1731   CALCIUM 9.4 04/10/2015 1012   CALCIUM 8.5* 02/14/2015 1439   PROT 6.5 04/10/2015 1012   PROT 7.7 02/14/2015 1439   ALBUMIN 3.1* 04/10/2015 1012   ALBUMIN 3.3* 02/14/2015 1439   AST 17 04/10/2015 1012   AST 56* 02/14/2015 1439   ALT 12 04/10/2015 1012   ALT 27 02/14/2015 1439   ALKPHOS 114 04/10/2015 1012   ALKPHOS 113 02/14/2015 1439   BILITOT 0.29 04/10/2015 1012   BILITOT 0.4 02/14/2015 1439   GFRNONAA >60 02/22/2015 1731   GFRAA >60 02/22/2015 1731     Impression:  The patient is tolerating radiotherapy   .   Plan:  Continue radiotherapy as planned. Receiving daily IV fluids  Med/onc will continue to monitor hydrational needs.   Re iterated role of carafate and lidocaine given for esophagitis  Applauded on weight gain  -----------------------------------  [name], MD

## 2015-04-17 ENCOUNTER — Ambulatory Visit
Admission: RE | Admit: 2015-04-17 | Discharge: 2015-04-17 | Disposition: A | Discharge status: Home / Self Care | Payer: Medicaid Other | Source: Ambulatory Visit | Attending: Radiation Oncology | Admitting: Radiation Oncology

## 2015-04-17 ENCOUNTER — Ambulatory Visit (HOSPITAL_BASED_OUTPATIENT_CLINIC_OR_DEPARTMENT_OTHER): Payer: Medicaid Other | Admitting: Hematology and Oncology

## 2015-04-17 ENCOUNTER — Telehealth: Payer: Self-pay | Admitting: Hematology and Oncology

## 2015-04-17 ENCOUNTER — Encounter: Payer: Self-pay | Admitting: Hematology and Oncology

## 2015-04-17 ENCOUNTER — Ambulatory Visit (HOSPITAL_BASED_OUTPATIENT_CLINIC_OR_DEPARTMENT_OTHER): Payer: Medicaid Other

## 2015-04-17 ENCOUNTER — Other Ambulatory Visit (HOSPITAL_BASED_OUTPATIENT_CLINIC_OR_DEPARTMENT_OTHER): Payer: Medicaid Other

## 2015-04-17 VITALS — BP 127/67

## 2015-04-17 VITALS — BP 92/65 | HR 100 | Temp 98.6°F | Resp 19 | Ht 73.0 in | Wt 140.6 lb

## 2015-04-17 DIAGNOSIS — E860 Dehydration: Secondary | ICD-10-CM

## 2015-04-17 DIAGNOSIS — K1231 Oral mucositis (ulcerative) due to antineoplastic therapy: Secondary | ICD-10-CM | POA: Diagnosis not present

## 2015-04-17 DIAGNOSIS — C321 Malignant neoplasm of supraglottis: Secondary | ICD-10-CM | POA: Diagnosis present

## 2015-04-17 DIAGNOSIS — Z931 Gastrostomy status: Secondary | ICD-10-CM

## 2015-04-17 DIAGNOSIS — T451X5A Adverse effect of antineoplastic and immunosuppressive drugs, initial encounter: Secondary | ICD-10-CM

## 2015-04-17 DIAGNOSIS — E46 Unspecified protein-calorie malnutrition: Secondary | ICD-10-CM

## 2015-04-17 DIAGNOSIS — R110 Nausea: Secondary | ICD-10-CM | POA: Diagnosis not present

## 2015-04-17 DIAGNOSIS — E43 Unspecified severe protein-calorie malnutrition: Secondary | ICD-10-CM

## 2015-04-17 DIAGNOSIS — Z510 Encounter for antineoplastic radiation therapy: Secondary | ICD-10-CM | POA: Diagnosis not present

## 2015-04-17 LAB — CBC WITH DIFFERENTIAL/PLATELET
BASO%: 0.3 % (ref 0.0–2.0)
Basophils Absolute: 0 10*3/uL (ref 0.0–0.1)
EOS%: 2.4 % (ref 0.0–7.0)
Eosinophils Absolute: 0.1 10*3/uL (ref 0.0–0.5)
HCT: 32.6 % — ABNORMAL LOW (ref 38.4–49.9)
HGB: 11 g/dL — ABNORMAL LOW (ref 13.0–17.1)
LYMPH%: 10.5 % — AB (ref 14.0–49.0)
MCH: 31.9 pg (ref 27.2–33.4)
MCHC: 33.6 g/dL (ref 32.0–36.0)
MCV: 94.8 fL (ref 79.3–98.0)
MONO#: 0.8 10*3/uL (ref 0.1–0.9)
MONO%: 16.2 % — AB (ref 0.0–14.0)
NEUT%: 70.6 % (ref 39.0–75.0)
NEUTROS ABS: 3.6 10*3/uL (ref 1.5–6.5)
Platelets: 255 10*3/uL (ref 140–400)
RBC: 3.44 10*6/uL — AB (ref 4.20–5.82)
RDW: 14.2 % (ref 11.0–14.6)
WBC: 5.1 10*3/uL (ref 4.0–10.3)
lymph#: 0.5 10*3/uL — ABNORMAL LOW (ref 0.9–3.3)

## 2015-04-17 LAB — COMPREHENSIVE METABOLIC PANEL (CC13)
ALT: 25 U/L (ref 0–55)
ANION GAP: 8 meq/L (ref 3–11)
AST: 27 U/L (ref 5–34)
Albumin: 3.2 g/dL — ABNORMAL LOW (ref 3.5–5.0)
Alkaline Phosphatase: 114 U/L (ref 40–150)
BUN: 16.8 mg/dL (ref 7.0–26.0)
CALCIUM: 9.2 mg/dL (ref 8.4–10.4)
CHLORIDE: 95 meq/L — AB (ref 98–109)
CO2: 33 meq/L — AB (ref 22–29)
CREATININE: 0.7 mg/dL (ref 0.7–1.3)
Glucose: 122 mg/dl (ref 70–140)
POTASSIUM: 3.8 meq/L (ref 3.5–5.1)
Sodium: 135 mEq/L — ABNORMAL LOW (ref 136–145)
Total Bilirubin: 0.48 mg/dL (ref 0.20–1.20)
Total Protein: 6.7 g/dL (ref 6.4–8.3)

## 2015-04-17 MED ORDER — HYDROMORPHONE HCL 4 MG/ML IJ SOLN
INTRAMUSCULAR | Status: AC
  Filled 2015-04-17: qty 1

## 2015-04-17 MED ORDER — HEPARIN SOD (PORK) LOCK FLUSH 100 UNIT/ML IV SOLN
500.0000 [IU] | Freq: Once | INTRAVENOUS | Status: AC
  Administered 2015-04-17: 500 [IU] via INTRAVENOUS
  Filled 2015-04-17: qty 5

## 2015-04-17 MED ORDER — SODIUM CHLORIDE 0.9 % IV SOLN
Freq: Once | INTRAVENOUS | Status: AC
  Administered 2015-04-17: 11:00:00 via INTRAVENOUS
  Filled 2015-04-17: qty 4

## 2015-04-17 MED ORDER — HYDROMORPHONE HCL 1 MG/ML IJ SOLN
2.0000 mg | INTRAMUSCULAR | Status: DC | PRN
  Administered 2015-04-17: 2 mg via INTRAVENOUS
  Filled 2015-04-17: qty 2

## 2015-04-17 MED ORDER — SODIUM CHLORIDE 0.9 % IV SOLN
Freq: Once | INTRAVENOUS | Status: AC
  Administered 2015-04-17: 11:00:00 via INTRAVENOUS

## 2015-04-17 MED ORDER — SODIUM CHLORIDE 0.9 % IJ SOLN
10.0000 mL | INTRAMUSCULAR | Status: DC | PRN
  Administered 2015-04-17: 10 mL via INTRAVENOUS
  Filled 2015-04-17: qty 10

## 2015-04-17 NOTE — Assessment & Plan Note (Signed)
He has lost a lot of weight due to dysphagia. We will get him follow with speech and language therapist and follow up with nutritionist. He is currently dependent on tube feeds long-term but is able to tolerate some oral diet. He will continue close follow-up with dietitian   

## 2015-04-17 NOTE — Telephone Encounter (Signed)
per pof to sch pt appt-Melissa moved appt per pof-Shameeka moved infusion-pt to get updated avs b4 leavin

## 2015-04-17 NOTE — Assessment & Plan Note (Signed)
He has started to experience side effects with nausea. As mentioned above, I will start him on IV fluids daily along with IV anti-emetics. 

## 2015-04-17 NOTE — Assessment & Plan Note (Signed)
I adjusted the feeding tube so that it is more sung against the abdomen wall. There is no signs of infection

## 2015-04-17 NOTE — Assessment & Plan Note (Signed)
His pain is well controlled. He will continue on fentanyl patch and Roxanol for pain control. He is on regular laxatives regimen to prevent constipation. 

## 2015-04-17 NOTE — Assessment & Plan Note (Signed)
He has clinical signs of dehydration with hypotension and tachycardia. We will continue aggressive IV fluid support.

## 2015-04-17 NOTE — Patient Instructions (Signed)

## 2015-04-17 NOTE — Assessment & Plan Note (Signed)
He tolerated treatment very well with objective response to treatment, with resolution of the lymphadenopathy. I will continue aggressive IV fluid support for this patient

## 2015-04-17 NOTE — Progress Notes (Signed)
[facility] OFFICE PROGRESS NOTE  Patient Care Team: No Pcp Per Patient as PCP - General (General Practice) [name], MD as Consulting Physician (Otolaryngology) [name], MD as Attending Physician (Radiation Oncology) [name], MD as Consulting Physician (Hematology and Oncology) Leota Sauers, RN as Oncology Nurse Navigator  SUMMARY OF ONCOLOGIC HISTORY: Oncology History   Carcinoma of supraglottis   Staging form: Larynx - Supraglottis, AJCC 7th Edition     Clinical stage from 03/13/2015: Stage IVA (T3, N2b, M0) - Signed by [name], MD on 03/13/2015       Carcinoma of supraglottis   01/22/2015 Imaging CT scan showed enhancing infiltrative mass centered at the right piriform sinus with bilateral neck lymphadenopathy   02/22/2015 - 03/08/2015 Hospital Admission He was admitted to the hospital for management of advanced supraglottic cancer status post tracheostomy, biopsy, dental extraction, placement of feeding tube and subsequent discharge to skilled nursing facility   02/22/2015 Pathology Results Accession: [id number] 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   03/09/2015 PET scan 1)  Right piriform sinus mass with right level 2 nodal metastasis.  2)  Hypermetabolic thoracic nodes which are at least partially felt to be reactive.  3)  No evidence of subdiaphragmatic metastatic disease.    03/16/2015 Procedure He has placement of port   03/21/2015 -  Chemotherapy He received high dose cisplatin   03/21/2015 -  Radiation Therapy He received radiation treatment with chemotherapy    INTERVAL HISTORY: Please see below for problem oriented charting. He is seen for weekly supportive care. He went to the emergency department last week because  tracheostomy tube fell out and has to be repositioned His nausea is better controlled recently. No vomiting. He  is tolerating oral diet along with 3 nutritional supplement per day. His pain is under control.  REVIEW OF SYSTEMS:   Constitutional: Denies fevers, chills or abnormal weight loss Eyes: Denies blurriness of vision Respiratory: Denies cough, dyspnea or wheezes Cardiovascular: Denies palpitation, chest discomfort or lower extremity swelling Skin: Denies abnormal skin rashes Lymphatics: Denies new lymphadenopathy or easy bruising Neurological:Denies numbness, tingling or new weaknesses Behavioral/Psych: Mood is stable, no new changes  All other systems were reviewed with the patient and are negative.  I have reviewed the past medical history, past surgical history, social history and family history with the patient and they are unchanged from previous note.  ALLERGIES:  has No Known Allergies.  MEDICATIONS:  Current Outpatient Prescriptions  Medication Sig Dispense Refill  . bisacodyl (DULCOLAX) 10 MG suppository Place 10 mg rectally as needed for moderate constipation.    . citalopram (CELEXA) 20 MG tablet 20 mg by PEG Tube route daily.    Marland Kitchen emollient (BIAFINE) cream Apply topically 2 (two) times daily.    . fentaNYL (DURAGESIC - DOSED MCG/HR) 25 MCG/HR patch Place 1 patch (25 mcg total) onto the skin every 3 (three) days. 5 patch 0  . folic acid (FOLVITE) 1 MG tablet Take 1 tablet (1 mg total) by mouth daily. (Patient taking differently: 1 mg by PEG Tube route daily. ) 30 tablet 0  . HYDROcodone-acetaminophen (HYCET) 7.5-325 mg/15 ml solution Take 15 mLs by mouth 4 (four) times daily as needed for moderate pain. 120 mL 0  . ibuprofen (ADVIL,MOTRIN) 100 MG/5ML suspension Take 200-400 mg by mouth every 4 (four) hours as needed for mild pain.     Marland Kitchen  lidocaine (XYLOCAINE) 2 % solution Mix 1 part 2%viscous lidocaine,1part H2O.Swish and/or swallow 31ml of this mixture,40min before meals and at bedtime, up to QID 100 mL 5  . lidocaine-prilocaine (EMLA) cream Apply 1 application topically as  needed (FOR PORT ACCESS).    . LORazepam (ATIVAN) 1 MG tablet Take 0.5-1 mg by mouth 3 (three) times daily. TAKE 1 MG FOR 7 DAYS AND WILL CHANGE TO 1/2 TABLET (0.5MG ) ON 03/20/2015    . magnesium hydroxide (MILK OF MAGNESIA) 400 MG/5ML suspension Take 30 mLs by mouth daily as needed for mild constipation.    Marland Kitchen morphine (ROXANOL) 20 MG/ML concentrated solution Take 1 mL (20 mg total) by mouth every 2 (two) hours as needed for moderate pain or severe pain. 240 mL 0  . Multiple Vitamin (MULTIVITAMIN WITH MINERALS) TABS tablet Take 1 tablet by mouth daily. (Patient taking differently: 1 tablet by PEG Tube route daily. ) 30 tablet 0  . nicotine (NICODERM CQ - DOSED IN MG/24 HOURS) 21 mg/24hr patch Place 1 patch (21 mg total) onto the skin daily. 28 patch 0  . Nutritional Supplements (FEEDING SUPPLEMENT, JEVITY 1.2 CAL,) LIQD Give 1 can Jevity 1.2 via PEG TID between meals with 125 ml free water before and after bolus feeding.  In addition, give 250 cc free water TID with meals. 711 mL 0  . omeprazole (PRILOSEC) 20 MG capsule 20 mg by PEG Tube route daily.    . ondansetron (ZOFRAN) 8 MG tablet 8 mg by PEG Tube route every 8 (eight) hours as needed for nausea or vomiting.    . pravastatin (PRAVACHOL) 20 MG tablet 20 mg by PEG Tube route daily.    Marland Kitchen PRESCRIPTION MEDICATION CHEMO [facility]    . prochlorperazine (COMPAZINE) 10 MG tablet 10 mg by PEG Tube route every 6 (six) hours as needed for nausea or vomiting.    . sodium fluoride (PREVIDENT 5000 PLUS) 1.1 % CREA dental cream Apply thin ribbon of cream to tooth brush. Brush teeth for 2 minutes. Spit out excess-DO NOT swallow. DO NOT rinse afterwards. Repeat nightly. 1 Tube prn  . sucralfate (CARAFATE) 1 G tablet Dissolve 1 tablet in 68ml H2O and swallow up to QID,PRN sore throat. 60 tablet 5  . thiamine 100 MG tablet Take 1 tablet (100 mg total) by mouth daily. 30 tablet 0   No current facility-administered medications for this visit.   Facility-Administered  Medications Ordered in Other Visits  Medication Dose Route Frequency Provider Last Rate Last Dose  . ondansetron (ZOFRAN) 8 mg in sodium chloride 0.9 % 50 mL IVPB   Intravenous Once [name], MD        PHYSICAL EXAMINATION: ECOG PERFORMANCE STATUS: 2 - Symptomatic, <50% confined to bed  Filed Vitals:   04/17/15 1024  BP: 92/65  Pulse: 100  Temp: 98.6 F (37 C)  Resp: 19   Filed Weights   04/17/15 1024  Weight: 140 lb 9.6 oz (63.776 kg)    GENERAL:alert, no distress and comfortable SKIN: skin color, texture, turgor are normal, no rashes or significant lesions EYES: normal, Conjunctiva are pink and non-injected, sclera clear OROPHARYNX:no exudate, no erythema and lips, buccal mucosa, and tongue normal . No mucositis or thrush NECK: tracheostomy in situ with some redness around the trach site. LYMPH:  no palpable lymphadenopathy in the cervical, axillary or inguinal LUNGS: clear to auscultation and percussion with normal breathing effort HEART: regular rate & rhythm and no murmurs and no lower extremity edema ABDOMEN:abdomen soft, non-tender  and normal bowel sounds. Feeding tube site looks okay. Musculoskeletal:no cyanosis of digits and no clubbing  NEURO: alert & oriented x 3 with fluent speech, no focal motor/sensory deficits  LABORATORY DATA:  I have reviewed the data as listed    Component Value Date/Time   NA 140 04/10/2015 1012   NA 135 02/14/2015 1439   K 4.8 04/10/2015 1012   K 4.2 02/14/2015 1439   CL 100* 02/14/2015 1439   CO2 29 04/10/2015 1012   CO2 24 02/14/2015 1439   GLUCOSE 80 04/10/2015 1012   GLUCOSE 81 02/14/2015 1439   BUN 9.3 04/10/2015 1012   BUN <5* 02/14/2015 1439   CREATININE 0.7 04/10/2015 1012   CREATININE 0.65 02/22/2015 1731   CALCIUM 9.4 04/10/2015 1012   CALCIUM 8.5* 02/14/2015 1439   PROT 6.5 04/10/2015 1012   PROT 7.7 02/14/2015 1439   ALBUMIN 3.1* 04/10/2015 1012   ALBUMIN 3.3* 02/14/2015 1439   AST 17 04/10/2015 1012   AST 56*  02/14/2015 1439   ALT 12 04/10/2015 1012   ALT 27 02/14/2015 1439   ALKPHOS 114 04/10/2015 1012   ALKPHOS 113 02/14/2015 1439   BILITOT 0.29 04/10/2015 1012   BILITOT 0.4 02/14/2015 1439   GFRNONAA >60 02/22/2015 1731   GFRAA >60 02/22/2015 1731    No results found for: SPEP, UPEP  Lab Results  Component Value Date   WBC 5.1 04/17/2015   NEUTROABS 3.6 04/17/2015   HGB 11.0* 04/17/2015   HCT 32.6* 04/17/2015   MCV 94.8 04/17/2015   PLT 255 04/17/2015      Chemistry      Component Value Date/Time   NA 140 04/10/2015 1012   NA 135 02/14/2015 1439   K 4.8 04/10/2015 1012   K 4.2 02/14/2015 1439   CL 100* 02/14/2015 1439   CO2 29 04/10/2015 1012   CO2 24 02/14/2015 1439   BUN 9.3 04/10/2015 1012   BUN <5* 02/14/2015 1439   CREATININE 0.7 04/10/2015 1012   CREATININE 0.65 02/22/2015 1731      Component Value Date/Time   CALCIUM 9.4 04/10/2015 1012   CALCIUM 8.5* 02/14/2015 1439   ALKPHOS 114 04/10/2015 1012   ALKPHOS 113 02/14/2015 1439   AST 17 04/10/2015 1012   AST 56* 02/14/2015 1439   ALT 12 04/10/2015 1012   ALT 27 02/14/2015 1439   BILITOT 0.29 04/10/2015 1012   BILITOT 0.4 02/14/2015 1439     ASSESSMENT & PLAN:  Carcinoma of supraglottis He tolerated treatment very well with objective response to treatment, with resolution of the lymphadenopathy. I will continue aggressive IV fluid support for this patient    Chemotherapy-induced nausea He has started to experience side effects with nausea. As mentioned above, I will start him on IV fluids daily along with IV anti-emetics.      Mucositis due to chemotherapy His pain is well controlled. He will continue on fentanyl patch and Roxanol for pain control. He is on regular laxatives regimen to prevent constipation.    Protein-calorie malnutrition, severe He has lost a lot of weight due to dysphagia. We will get him follow with speech and language therapist and follow up with nutritionist. He is  currently dependent on tube feeds long-term but is able to tolerate some oral diet. He will continue close follow-up with dietitian    S/P gastrostomy I adjusted the feeding tube so that it is more sung against the abdomen wall. There is no signs of infection  Dehydration He has  clinical signs of dehydration with hypotension and tachycardia. We will continue aggressive IV fluid support.   No orders of the defined types were placed in this encounter.   All questions were answered. The patient knows to call the clinic with any problems, questions or concerns. No barriers to learning was detected. I spent 25 minutes counseling the patient face to face. The total time spent in the appointment was 30 minutes and more than 50% was on counseling and review of test results     [facility], [place], MD 04/17/2015 10:42 AM

## 2015-04-18 ENCOUNTER — Ambulatory Visit
Admission: RE | Admit: 2015-04-18 | Discharge: 2015-04-18 | Disposition: A | Discharge status: Home / Self Care | Payer: Medicaid Other | Source: Ambulatory Visit | Attending: Radiation Oncology | Admitting: Radiation Oncology

## 2015-04-18 ENCOUNTER — Ambulatory Visit (HOSPITAL_BASED_OUTPATIENT_CLINIC_OR_DEPARTMENT_OTHER): Payer: Medicaid Other

## 2015-04-18 VITALS — BP 94/67 | HR 90 | Temp 98.9°F | Resp 18

## 2015-04-18 DIAGNOSIS — Z510 Encounter for antineoplastic radiation therapy: Secondary | ICD-10-CM | POA: Diagnosis not present

## 2015-04-18 DIAGNOSIS — C321 Malignant neoplasm of supraglottis: Secondary | ICD-10-CM | POA: Diagnosis not present

## 2015-04-18 DIAGNOSIS — R110 Nausea: Secondary | ICD-10-CM

## 2015-04-18 DIAGNOSIS — E860 Dehydration: Secondary | ICD-10-CM | POA: Diagnosis not present

## 2015-04-18 DIAGNOSIS — E43 Unspecified severe protein-calorie malnutrition: Secondary | ICD-10-CM

## 2015-04-18 DIAGNOSIS — E46 Unspecified protein-calorie malnutrition: Secondary | ICD-10-CM | POA: Diagnosis not present

## 2015-04-18 MED ORDER — HYDROMORPHONE HCL PF 4 MG/ML IJ SOLN
2.0000 mg | INTRAMUSCULAR | Status: DC | PRN
  Administered 2015-04-18: 2 mg via INTRAVENOUS
  Filled 2015-04-18: qty 0.5

## 2015-04-18 MED ORDER — HYDROMORPHONE HCL 4 MG/ML IJ SOLN
INTRAMUSCULAR | Status: AC
  Filled 2015-04-18: qty 1

## 2015-04-18 MED ORDER — ONDANSETRON HCL 40 MG/20ML IJ SOLN
Freq: Once | INTRAMUSCULAR | Status: AC
  Administered 2015-04-18: 08:00:00 via INTRAVENOUS
  Filled 2015-04-18: qty 4

## 2015-04-18 MED ORDER — ALTEPLASE 2 MG IJ SOLR
2.0000 mg | Freq: Once | INTRAMUSCULAR | Status: DC | PRN
  Filled 2015-04-18: qty 2

## 2015-04-18 MED ORDER — HEPARIN SOD (PORK) LOCK FLUSH 100 UNIT/ML IV SOLN
250.0000 [IU] | Freq: Once | INTRAVENOUS | Status: DC | PRN
  Filled 2015-04-18: qty 5

## 2015-04-18 MED ORDER — SODIUM CHLORIDE 0.9 % IV SOLN
Freq: Once | INTRAVENOUS | Status: AC
  Administered 2015-04-18: 08:00:00 via INTRAVENOUS

## 2015-04-18 MED ORDER — HEPARIN SOD (PORK) LOCK FLUSH 100 UNIT/ML IV SOLN
500.0000 [IU] | Freq: Once | INTRAVENOUS | Status: AC | PRN
  Administered 2015-04-18: 500 [IU]
  Filled 2015-04-18: qty 5

## 2015-04-18 MED ORDER — SODIUM CHLORIDE 0.9 % IJ SOLN
10.0000 mL | INTRAMUSCULAR | Status: DC | PRN
  Administered 2015-04-18: 10 mL
  Filled 2015-04-18: qty 10

## 2015-04-18 NOTE — Patient Instructions (Signed)
Dehydration, Adult Dehydration is when you lose more fluids from the body than you take in. Vital organs like the kidneys, brain, and heart cannot function without a proper amount of fluids and salt. Any loss of fluids from the body can cause dehydration.  CAUSES   Vomiting.  Diarrhea.  Excessive sweating.  Excessive urine output.  Fever. SYMPTOMS  Mild dehydration  Thirst.  Dry lips.  Slightly dry mouth. Moderate dehydration  Very dry mouth.  Sunken eyes.  Skin does not bounce back quickly when lightly pinched and released.  Dark urine and decreased urine production.  Decreased tear production.  Headache. Severe dehydration  Very dry mouth.  Extreme thirst.  Rapid, weak pulse (more than 100 beats per minute at rest).  Cold hands and feet.  Not able to sweat in spite of heat and temperature.  Rapid breathing.  Blue lips.  Confusion and lethargy.  Difficulty being awakened.  Minimal urine production.  No tears. DIAGNOSIS  Your caregiver will diagnose dehydration based on your symptoms and your exam. Blood and urine tests will help confirm the diagnosis. The diagnostic evaluation should also identify the cause of dehydration. TREATMENT  Treatment of mild or moderate dehydration can often be done at home by increasing the amount of fluids that you drink. It is best to drink small amounts of fluid more often. Drinking too much at one time can make vomiting worse. Refer to the home care instructions below. Severe dehydration needs to be treated at the hospital where you will probably be given intravenous (IV) fluids that contain water and electrolytes. HOME CARE INSTRUCTIONS   Ask your caregiver about specific rehydration instructions.  Drink enough fluids to keep your urine clear or pale yellow.  Drink small amounts frequently if you have nausea and vomiting.  Eat as you normally do.  Avoid:  Foods or drinks high in sugar.  Carbonated  drinks.  Juice.  Extremely hot or cold fluids.  Drinks with caffeine.  Fatty, greasy foods.  Alcohol.  Tobacco.  Overeating.  Gelatin desserts.  Wash your hands well to avoid spreading bacteria and viruses.  Only take over-the-counter or prescription medicines for pain, discomfort, or fever as directed by your caregiver.  Ask your caregiver if you should continue all prescribed and over-the-counter medicines.  Keep all follow-up appointments with your caregiver. SEEK MEDICAL CARE IF:  You have abdominal pain and it increases or stays in one area (localizes).  You have a rash, stiff neck, or severe headache.  You are irritable, sleepy, or difficult to awaken.  You are weak, dizzy, or extremely thirsty. SEEK IMMEDIATE MEDICAL CARE IF:   You are unable to keep fluids down or you get worse despite treatment.  You have frequent episodes of vomiting or diarrhea.  You have blood or green matter (bile) in your vomit.  You have blood in your stool or your stool looks black and tarry.  You have not urinated in 6 to 8 hours, or you have only urinated a small amount of very dark urine.  You have a fever.  You faint. MAKE SURE YOU:   Understand these instructions.  Will watch your condition.  Will get help right away if you are not doing well or get worse. Document Released: 08/04/2005 Document Revised: 10/27/2011 Document Reviewed: 03/24/2011 ExitCare Patient Information 2015 [employer]. This information is not intended to replace advice given to you by your health care provider. Make sure you discuss any questions you have with your health care   provider.  

## 2015-04-19 ENCOUNTER — Ambulatory Visit
Admission: RE | Admit: 2015-04-19 | Discharge: 2015-04-19 | Disposition: A | Discharge status: Home / Self Care | Payer: Medicaid Other | Source: Ambulatory Visit | Attending: Radiation Oncology | Admitting: Radiation Oncology

## 2015-04-19 ENCOUNTER — Ambulatory Visit (HOSPITAL_BASED_OUTPATIENT_CLINIC_OR_DEPARTMENT_OTHER): Payer: Medicaid Other

## 2015-04-19 VITALS — BP 128/83 | HR 71 | Temp 98.8°F | Resp 18

## 2015-04-19 DIAGNOSIS — E46 Unspecified protein-calorie malnutrition: Secondary | ICD-10-CM | POA: Diagnosis not present

## 2015-04-19 DIAGNOSIS — Z510 Encounter for antineoplastic radiation therapy: Secondary | ICD-10-CM | POA: Diagnosis not present

## 2015-04-19 DIAGNOSIS — C321 Malignant neoplasm of supraglottis: Secondary | ICD-10-CM

## 2015-04-19 DIAGNOSIS — R110 Nausea: Secondary | ICD-10-CM

## 2015-04-19 DIAGNOSIS — E43 Unspecified severe protein-calorie malnutrition: Secondary | ICD-10-CM

## 2015-04-19 DIAGNOSIS — E860 Dehydration: Secondary | ICD-10-CM | POA: Diagnosis present

## 2015-04-19 MED ORDER — SODIUM CHLORIDE 0.9 % IJ SOLN
10.0000 mL | INTRAMUSCULAR | Status: DC | PRN
  Administered 2015-04-19: 10 mL
  Filled 2015-04-19: qty 10

## 2015-04-19 MED ORDER — HYDROMORPHONE HCL 1 MG/ML IJ SOLN
2.0000 mg | INTRAMUSCULAR | Status: DC | PRN
  Administered 2015-04-19: 2 mg via INTRAVENOUS
  Filled 2015-04-19: qty 2

## 2015-04-19 MED ORDER — SODIUM CHLORIDE 0.9 % IV SOLN
Freq: Once | INTRAVENOUS | Status: AC
  Administered 2015-04-19: 08:00:00 via INTRAVENOUS
  Filled 2015-04-19: qty 4

## 2015-04-19 MED ORDER — SODIUM CHLORIDE 0.9 % IV SOLN
Freq: Once | INTRAVENOUS | Status: AC
  Administered 2015-04-19: 08:00:00 via INTRAVENOUS

## 2015-04-19 MED ORDER — HYDROMORPHONE HCL 4 MG/ML IJ SOLN
INTRAMUSCULAR | Status: AC
  Filled 2015-04-19: qty 1

## 2015-04-19 MED ORDER — HEPARIN SOD (PORK) LOCK FLUSH 100 UNIT/ML IV SOLN
500.0000 [IU] | Freq: Once | INTRAVENOUS | Status: AC | PRN
  Administered 2015-04-19: 500 [IU]
  Filled 2015-04-19: qty 5

## 2015-04-19 NOTE — Patient Instructions (Signed)
Dehydration, Adult Dehydration is when you lose more fluids from the body than you take in. Vital organs like the kidneys, brain, and heart cannot function without a proper amount of fluids and salt. Any loss of fluids from the body can cause dehydration.  CAUSES   Vomiting.  Diarrhea.  Excessive sweating.  Excessive urine output.  Fever. SYMPTOMS  Mild dehydration  Thirst.  Dry lips.  Slightly dry mouth. Moderate dehydration  Very dry mouth.  Sunken eyes.  Skin does not bounce back quickly when lightly pinched and released.  Dark urine and decreased urine production.  Decreased tear production.  Headache. Severe dehydration  Very dry mouth.  Extreme thirst.  Rapid, weak pulse (more than 100 beats per minute at rest).  Cold hands and feet.  Not able to sweat in spite of heat and temperature.  Rapid breathing.  Blue lips.  Confusion and lethargy.  Difficulty being awakened.  Minimal urine production.  No tears. DIAGNOSIS  Your caregiver will diagnose dehydration based on your symptoms and your exam. Blood and urine tests will help confirm the diagnosis. The diagnostic evaluation should also identify the cause of dehydration. TREATMENT  Treatment of mild or moderate dehydration can often be done at home by increasing the amount of fluids that you drink. It is best to drink small amounts of fluid more often. Drinking too much at one time can make vomiting worse. Refer to the home care instructions below. Severe dehydration needs to be treated at the hospital where you will probably be given intravenous (IV) fluids that contain water and electrolytes. HOME CARE INSTRUCTIONS   Ask your caregiver about specific rehydration instructions.  Drink enough fluids to keep your urine clear or pale yellow.  Drink small amounts frequently if you have nausea and vomiting.  Eat as you normally do.  Avoid:  Foods or drinks high in sugar.  Carbonated  drinks.  Juice.  Extremely hot or cold fluids.  Drinks with caffeine.  Fatty, greasy foods.  Alcohol.  Tobacco.  Overeating.  Gelatin desserts.  Wash your hands well to avoid spreading bacteria and viruses.  Only take over-the-counter or prescription medicines for pain, discomfort, or fever as directed by your caregiver.  Ask your caregiver if you should continue all prescribed and over-the-counter medicines.  Keep all follow-up appointments with your caregiver. SEEK MEDICAL CARE IF:  You have abdominal pain and it increases or stays in one area (localizes).  You have a rash, stiff neck, or severe headache.  You are irritable, sleepy, or difficult to awaken.  You are weak, dizzy, or extremely thirsty. SEEK IMMEDIATE MEDICAL CARE IF:   You are unable to keep fluids down or you get worse despite treatment.  You have frequent episodes of vomiting or diarrhea.  You have blood or green matter (bile) in your vomit.  You have blood in your stool or your stool looks black and tarry.  You have not urinated in 6 to 8 hours, or you have only urinated a small amount of very dark urine.  You have a fever.  You faint. MAKE SURE YOU:   Understand these instructions.  Will watch your condition.  Will get help right away if you are not doing well or get worse. Document Released: 08/04/2005 Document Revised: 10/27/2011 Document Reviewed: 03/24/2011 ExitCare Patient Information 2015 [employer]. This information is not intended to replace advice given to you by your health care provider. Make sure you discuss any questions you have with your health care   provider.  

## 2015-04-20 ENCOUNTER — Ambulatory Visit (HOSPITAL_BASED_OUTPATIENT_CLINIC_OR_DEPARTMENT_OTHER): Payer: Medicaid Other

## 2015-04-20 ENCOUNTER — Encounter: Payer: Self-pay | Admitting: *Deleted

## 2015-04-20 ENCOUNTER — Ambulatory Visit
Admission: RE | Admit: 2015-04-20 | Discharge: 2015-04-20 | Disposition: A | Discharge status: Home / Self Care | Payer: Medicaid Other | Source: Ambulatory Visit | Attending: Radiation Oncology | Admitting: Radiation Oncology

## 2015-04-20 VITALS — BP 117/82 | HR 77 | Temp 98.4°F | Resp 20

## 2015-04-20 DIAGNOSIS — R110 Nausea: Secondary | ICD-10-CM | POA: Diagnosis not present

## 2015-04-20 DIAGNOSIS — E43 Unspecified severe protein-calorie malnutrition: Secondary | ICD-10-CM

## 2015-04-20 DIAGNOSIS — Z510 Encounter for antineoplastic radiation therapy: Secondary | ICD-10-CM | POA: Diagnosis not present

## 2015-04-20 DIAGNOSIS — C321 Malignant neoplasm of supraglottis: Secondary | ICD-10-CM

## 2015-04-20 DIAGNOSIS — Z23 Encounter for immunization: Secondary | ICD-10-CM | POA: Diagnosis not present

## 2015-04-20 DIAGNOSIS — K1231 Oral mucositis (ulcerative) due to antineoplastic therapy: Secondary | ICD-10-CM | POA: Diagnosis not present

## 2015-04-20 MED ORDER — INFLUENZA VAC SPLIT QUAD 0.5 ML IM SUSY
0.5000 mL | PREFILLED_SYRINGE | Freq: Once | INTRAMUSCULAR | Status: AC
  Administered 2015-04-20: 0.5 mL via INTRAMUSCULAR
  Filled 2015-04-20: qty 0.5

## 2015-04-20 MED ORDER — SODIUM CHLORIDE 0.9 % IJ SOLN
10.0000 mL | INTRAMUSCULAR | Status: DC | PRN
  Administered 2015-04-20: 10 mL
  Filled 2015-04-20: qty 10

## 2015-04-20 MED ORDER — SODIUM CHLORIDE 0.9 % IV SOLN
Freq: Once | INTRAVENOUS | Status: AC
  Administered 2015-04-20: 11:00:00 via INTRAVENOUS

## 2015-04-20 MED ORDER — HYDROMORPHONE HCL 4 MG/ML IJ SOLN
INTRAMUSCULAR | Status: AC
  Filled 2015-04-20: qty 1

## 2015-04-20 MED ORDER — HEPARIN SOD (PORK) LOCK FLUSH 100 UNIT/ML IV SOLN
500.0000 [IU] | Freq: Once | INTRAVENOUS | Status: AC | PRN
  Administered 2015-04-20: 500 [IU]
  Filled 2015-04-20: qty 5

## 2015-04-20 MED ORDER — ONDANSETRON HCL 40 MG/20ML IJ SOLN
Freq: Once | INTRAMUSCULAR | Status: AC
  Administered 2015-04-20: 11:00:00 via INTRAVENOUS
  Filled 2015-04-20: qty 4

## 2015-04-20 MED ORDER — HYDROMORPHONE HCL 1 MG/ML IJ SOLN
2.0000 mg | INTRAMUSCULAR | Status: DC | PRN
  Administered 2015-04-20: 2 mg via INTRAVENOUS
  Filled 2015-04-20: qty 2

## 2015-04-20 NOTE — Patient Instructions (Signed)

## 2015-04-21 ENCOUNTER — Ambulatory Visit (HOSPITAL_BASED_OUTPATIENT_CLINIC_OR_DEPARTMENT_OTHER): Payer: Medicaid Other

## 2015-04-21 VITALS — BP 106/77 | HR 79 | Temp 98.5°F | Resp 20

## 2015-04-21 DIAGNOSIS — C321 Malignant neoplasm of supraglottis: Secondary | ICD-10-CM | POA: Diagnosis not present

## 2015-04-21 DIAGNOSIS — E43 Unspecified severe protein-calorie malnutrition: Secondary | ICD-10-CM

## 2015-04-21 MED ORDER — DIPHENHYDRAMINE HCL 25 MG PO CAPS
ORAL_CAPSULE | ORAL | Status: AC
  Filled 2015-04-21: qty 1

## 2015-04-21 MED ORDER — HEPARIN SOD (PORK) LOCK FLUSH 100 UNIT/ML IV SOLN
500.0000 [IU] | Freq: Once | INTRAVENOUS | Status: AC | PRN
  Administered 2015-04-21: 500 [IU]
  Filled 2015-04-21: qty 5

## 2015-04-21 MED ORDER — SODIUM CHLORIDE 0.9 % IJ SOLN
10.0000 mL | INTRAMUSCULAR | Status: DC | PRN
  Administered 2015-04-21: 10 mL
  Filled 2015-04-21: qty 10

## 2015-04-21 MED ORDER — HEPARIN SOD (PORK) LOCK FLUSH 100 UNIT/ML IV SOLN
250.0000 [IU] | Freq: Once | INTRAVENOUS | Status: DC | PRN
  Filled 2015-04-21: qty 5

## 2015-04-21 MED ORDER — HYDROMORPHONE HCL 4 MG/ML IJ SOLN
INTRAMUSCULAR | Status: AC
  Filled 2015-04-21: qty 1

## 2015-04-21 MED ORDER — SODIUM CHLORIDE 0.9 % IV SOLN
Freq: Once | INTRAVENOUS | Status: AC
  Administered 2015-04-21 (×2): via INTRAVENOUS

## 2015-04-21 MED ORDER — SODIUM CHLORIDE 0.9 % IV SOLN
Freq: Once | INTRAVENOUS | Status: DC
  Administered 2015-04-21: 10:00:00 via INTRAVENOUS

## 2015-04-21 MED ORDER — SODIUM CHLORIDE 0.9 % IV SOLN: Freq: Once | INTRAVENOUS | Status: DC

## 2015-04-21 MED ORDER — ACETAMINOPHEN 325 MG PO TABS
ORAL_TABLET | ORAL | Status: AC
  Filled 2015-04-21: qty 2

## 2015-04-21 MED ORDER — HYDROMORPHONE HCL 1 MG/ML IJ SOLN
2.0000 mg | INTRAMUSCULAR | Status: DC | PRN
  Administered 2015-04-21: 2 mg via INTRAVENOUS

## 2015-04-21 NOTE — Patient Instructions (Signed)
Dehydration, Adult Dehydration is when you lose more fluids from the body than you take in. Vital organs like the kidneys, brain, and heart cannot function without a proper amount of fluids and salt. Any loss of fluids from the body can cause dehydration.  CAUSES   Vomiting.  Diarrhea.  Excessive sweating.  Excessive urine output.  Fever. SYMPTOMS  Mild dehydration  Thirst.  Dry lips.  Slightly dry mouth. Moderate dehydration  Very dry mouth.  Sunken eyes.  Skin does not bounce back quickly when lightly pinched and released.  Dark urine and decreased urine production.  Decreased tear production.  Headache. Severe dehydration  Very dry mouth.  Extreme thirst.  Rapid, weak pulse (more than 100 beats per minute at rest).  Cold hands and feet.  Not able to sweat in spite of heat and temperature.  Rapid breathing.  Blue lips.  Confusion and lethargy.  Difficulty being awakened.  Minimal urine production.  No tears. DIAGNOSIS  Your caregiver will diagnose dehydration based on your symptoms and your exam. Blood and urine tests will help confirm the diagnosis. The diagnostic evaluation should also identify the cause of dehydration. TREATMENT  Treatment of mild or moderate dehydration can often be done at home by increasing the amount of fluids that you drink. It is best to drink small amounts of fluid more often. Drinking too much at one time can make vomiting worse. Refer to the home care instructions below. Severe dehydration needs to be treated at the hospital where you will probably be given intravenous (IV) fluids that contain water and electrolytes. HOME CARE INSTRUCTIONS   Ask your caregiver about specific rehydration instructions.  Drink enough fluids to keep your urine clear or pale yellow.  Drink small amounts frequently if you have nausea and vomiting.  Eat as you normally do.  Avoid:  Foods or drinks high in sugar.  Carbonated  drinks.  Juice.  Extremely hot or cold fluids.  Drinks with caffeine.  Fatty, greasy foods.  Alcohol.  Tobacco.  Overeating.  Gelatin desserts.  Wash your hands well to avoid spreading bacteria and viruses.  Only take over-the-counter or prescription medicines for pain, discomfort, or fever as directed by your caregiver.  Ask your caregiver if you should continue all prescribed and over-the-counter medicines.  Keep all follow-up appointments with your caregiver. SEEK MEDICAL CARE IF:  You have abdominal pain and it increases or stays in one area (localizes).  You have a rash, stiff neck, or severe headache.  You are irritable, sleepy, or difficult to awaken.  You are weak, dizzy, or extremely thirsty. SEEK IMMEDIATE MEDICAL CARE IF:   You are unable to keep fluids down or you get worse despite treatment.  You have frequent episodes of vomiting or diarrhea.  You have blood or green matter (bile) in your vomit.  You have blood in your stool or your stool looks black and tarry.  You have not urinated in 6 to 8 hours, or you have only urinated a small amount of very dark urine.  You have a fever.  You faint. MAKE SURE YOU:   Understand these instructions.  Will watch your condition.  Will get help right away if you are not doing well or get worse. Document Released: 08/04/2005 Document Revised: 10/27/2011 Document Reviewed: 03/24/2011 ExitCare Patient Information 2015 [employer]. This information is not intended to replace advice given to you by your health care provider. Make sure you discuss any questions you have with your health care   provider.  

## 2015-04-24 ENCOUNTER — Encounter: Payer: Self-pay | Admitting: *Deleted

## 2015-04-24 ENCOUNTER — Other Ambulatory Visit: Payer: Self-pay | Admitting: Hematology and Oncology

## 2015-04-24 ENCOUNTER — Ambulatory Visit
Admission: RE | Admit: 2015-04-24 | Discharge: 2015-04-24 | Disposition: A | Discharge status: Home / Self Care | Payer: Medicaid Other | Source: Ambulatory Visit | Attending: Radiation Oncology | Admitting: Radiation Oncology

## 2015-04-24 ENCOUNTER — Ambulatory Visit (HOSPITAL_COMMUNITY)
Admission: RE | Admit: 2015-04-24 | Discharge: 2015-04-24 | Disposition: A | Discharge status: Home / Self Care | Payer: Medicaid Other | Source: Ambulatory Visit | Attending: Hematology and Oncology | Admitting: Hematology and Oncology

## 2015-04-24 ENCOUNTER — Other Ambulatory Visit: Payer: Self-pay | Admitting: *Deleted

## 2015-04-24 DIAGNOSIS — C321 Malignant neoplasm of supraglottis: Secondary | ICD-10-CM

## 2015-04-24 DIAGNOSIS — Z510 Encounter for antineoplastic radiation therapy: Secondary | ICD-10-CM | POA: Diagnosis not present

## 2015-04-24 MED ORDER — HYDROMORPHONE HCL 2 MG/ML IJ SOLN
2.0000 mg | Freq: Once | INTRAMUSCULAR | Status: AC | PRN
  Administered 2015-04-24: 2 mg via INTRAVENOUS
  Filled 2015-04-24: qty 1

## 2015-04-24 MED ORDER — SODIUM CHLORIDE 0.9 % IJ SOLN
10.0000 mL | INTRAMUSCULAR | Status: AC | PRN
  Administered 2015-04-24: 10 mL

## 2015-04-24 MED ORDER — SODIUM CHLORIDE 0.9 % IV SOLN
INTRAVENOUS | Status: AC
  Administered 2015-04-24: 1000 mL/h via INTRAVENOUS
  Administered 2015-04-24: 12:00:00 via INTRAVENOUS

## 2015-04-24 MED ORDER — SODIUM CHLORIDE 0.9 % IV SOLN: INTRAVENOUS | Status: DC

## 2015-04-24 MED ORDER — SODIUM CHLORIDE 0.9 % IV SOLN
Freq: Once | INTRAVENOUS | Status: AC
  Administered 2015-04-24: 11:00:00 via INTRAVENOUS
  Filled 2015-04-24: qty 4

## 2015-04-24 MED ORDER — HEPARIN SOD (PORK) LOCK FLUSH 100 UNIT/ML IV SOLN
500.0000 [IU] | INTRAVENOUS | Status: AC | PRN
  Administered 2015-04-24: 500 [IU]
  Filled 2015-04-24: qty 5

## 2015-04-24 NOTE — Progress Notes (Signed)
Diagnosis: Carcinoma of Supra glottis  MD: Dr. Alvy Bimler  Procedure: Infused 2 liters of Normal Saline and Zofran via porta cath  Condition during procedure: Pt tolerated fluids well. C/o throat pain, medicated with Dilaudid.  Condition post procedure: Porta cath flushed and de accessed per protocol. Pt alert, oriented and ambulatory. Transported back to cancer center via w/c.

## 2015-04-24 NOTE — Progress Notes (Signed)
  Oncology Nurse Navigator Documentation   Navigator Encounter Type: Clinic/MDC (04/20/15 1115) Patient Visit Type: Medonc (04/20/15 1115) Treatment Phase: Treatment (04/20/15 1115)     To provide support and encouragement, care continuity and to assess for needs, met with patient in Infusion where he was receiving IVF. I provided him a current Epic appt calendar, noted that he will receive IVF at [place] next Tues and Thurs.    I explained someone will transport him to the clinic.  He verbalized understanding. He did not express any needs or concerns at this time, I encouraged him to contact me if that changes, he verbalized understanding.  [name], RN, BSN, [place] at [place] [phone]                  Time Spent with Patient: 15 (04/20/15 1115)

## 2015-04-24 NOTE — Progress Notes (Signed)
  Oncology Nurse Navigator Documentation   Navigator Encounter Type: Treatment (04/24/15 0940) Patient Visit Type: Radonc (04/24/15 0940)     Met with patient after RT, transported him by wheelchair to [facility] for his hydration appt. He verbalized understanding he will receive fluids at this clinic on Thursday as well. Receptionist stated he will be transported back to [facility] after tmt.  [name], RN, BSN, [place] at [place] [phone]                   Time Spent with Patient: 41 (04/24/15 0940)

## 2015-04-25 ENCOUNTER — Ambulatory Visit: Payer: Medicaid Other | Admitting: Nutrition

## 2015-04-25 ENCOUNTER — Ambulatory Visit
Admission: RE | Admit: 2015-04-25 | Discharge: 2015-04-25 | Disposition: A | Discharge status: Home / Self Care | Payer: Medicaid Other | Source: Ambulatory Visit | Attending: Radiation Oncology | Admitting: Radiation Oncology

## 2015-04-25 ENCOUNTER — Ambulatory Visit (HOSPITAL_BASED_OUTPATIENT_CLINIC_OR_DEPARTMENT_OTHER): Payer: Medicaid Other | Admitting: Hematology and Oncology

## 2015-04-25 ENCOUNTER — Telehealth: Payer: Self-pay | Admitting: *Deleted

## 2015-04-25 ENCOUNTER — Ambulatory Visit (HOSPITAL_BASED_OUTPATIENT_CLINIC_OR_DEPARTMENT_OTHER): Payer: Medicaid Other

## 2015-04-25 ENCOUNTER — Encounter: Payer: Self-pay | Admitting: Hematology and Oncology

## 2015-04-25 VITALS — BP 122/85 | HR 87 | Temp 98.4°F

## 2015-04-25 VITALS — BP 95/55 | HR 84 | Temp 98.8°F | Resp 16 | Wt 136.0 lb

## 2015-04-25 DIAGNOSIS — K1231 Oral mucositis (ulcerative) due to antineoplastic therapy: Secondary | ICD-10-CM | POA: Diagnosis not present

## 2015-04-25 DIAGNOSIS — E860 Dehydration: Secondary | ICD-10-CM

## 2015-04-25 DIAGNOSIS — Z510 Encounter for antineoplastic radiation therapy: Secondary | ICD-10-CM | POA: Diagnosis not present

## 2015-04-25 DIAGNOSIS — C321 Malignant neoplasm of supraglottis: Secondary | ICD-10-CM

## 2015-04-25 DIAGNOSIS — R110 Nausea: Secondary | ICD-10-CM

## 2015-04-25 DIAGNOSIS — E43 Unspecified severe protein-calorie malnutrition: Secondary | ICD-10-CM | POA: Diagnosis not present

## 2015-04-25 DIAGNOSIS — Z931 Gastrostomy status: Secondary | ICD-10-CM

## 2015-04-25 DIAGNOSIS — T451X5A Adverse effect of antineoplastic and immunosuppressive drugs, initial encounter: Secondary | ICD-10-CM

## 2015-04-25 MED ORDER — FENTANYL 50 MCG/HR TD PT72: 50.0000 ug | MEDICATED_PATCH | TRANSDERMAL | Status: DC

## 2015-04-25 MED ORDER — SODIUM CHLORIDE 0.9 % IV SOLN
Freq: Once | INTRAVENOUS | Status: AC
  Administered 2015-04-25: 12:00:00 via INTRAVENOUS
  Filled 2015-04-25: qty 4

## 2015-04-25 MED ORDER — HYDROMORPHONE HCL 4 MG/ML IJ SOLN
INTRAMUSCULAR | Status: AC
  Filled 2015-04-25: qty 1

## 2015-04-25 MED ORDER — HEPARIN SOD (PORK) LOCK FLUSH 100 UNIT/ML IV SOLN
500.0000 [IU] | Freq: Once | INTRAVENOUS | Status: AC | PRN
  Administered 2015-04-25: 500 [IU]
  Filled 2015-04-25: qty 5

## 2015-04-25 MED ORDER — SODIUM CHLORIDE 0.9 % IJ SOLN
10.0000 mL | INTRAMUSCULAR | Status: DC | PRN
  Administered 2015-04-25: 10 mL
  Filled 2015-04-25: qty 10

## 2015-04-25 MED ORDER — HYDROMORPHONE HCL 1 MG/ML IJ SOLN
2.0000 mg | INTRAMUSCULAR | Status: DC | PRN
  Administered 2015-04-25: 2 mg via INTRAVENOUS
  Filled 2015-04-25: qty 2

## 2015-04-25 MED ORDER — SODIUM CHLORIDE 0.9 % IV SOLN
Freq: Once | INTRAVENOUS | Status: AC
  Administered 2015-04-25: 11:00:00 via INTRAVENOUS

## 2015-04-25 NOTE — Progress Notes (Signed)
Patient reports that he is eating more often and continues to tolerate Jevity 1.2 via PEG 3 times a day. Patient states he receives 1000 cc free water daily. Weight has decreased and was documented as 136 pounds. Patient states weight decrease is because he was missing meals.  This is now resolved. Patient denies nutrition impact symptoms. Patient states he continues to have hypotension but feels this has improved with increased fluids.  Nutrition diagnosis: Unintended weight loss continues.  Estimated nutrition needs: 1800-2100 calories, 90-102 grams protein, 2.2 L fluid.  Diet order: Dysphagia 1 pudding thick liquids.  Intervention:  atient educated to continue dysphagia 1, pudding thick liquid diet as tolerated. Patient should continue Jevity 1.2 3 times a day with free water flushes. Encouraged patient to provide extra free water via PEG when needed. Teach back method used.  Questions were answered.  Monitoring, evaluation, goals: Patient will work to continue increased oral intake plus enteral feedings to minimize further weight loss and provide adequate hydration.  Next visit: Friday, September 16 in the infusion room.  **Disclaimer: This note was dictated with voice recognition software. Similar sounding words can inadvertently be transcribed and this note may contain transcription errors which may not have been corrected upon publication of note.**

## 2015-04-25 NOTE — Assessment & Plan Note (Signed)
His feeding tube looked loose. We will adjust the feeding so that is more snug against the abdominal wall. It looks clean without signs of infection.

## 2015-04-25 NOTE — Telephone Encounter (Signed)
Per staff message and POF I have scheduled appts. Advised scheduler of appts. JMW  

## 2015-04-25 NOTE — Assessment & Plan Note (Signed)
He has clinical signs of dehydration with hypotension and tachycardia. We will continue aggressive IV fluid support.

## 2015-04-25 NOTE — Assessment & Plan Note (Signed)
His pain is not well controlled. He will continue on fentanyl patch and Roxanol for pain control. He is on regular laxatives regimen to prevent constipation.  I will increase the dose of fentanyl patch today due to poorly controlled pain from mucositis.

## 2015-04-25 NOTE — Assessment & Plan Note (Signed)
He has lost a lot of weight due to dysphagia. We will get him follow with speech and language therapist and follow up with nutritionist. He is currently dependent on tube feeds long-term but is able to tolerate some oral diet. He will continue close follow-up with dietitian   

## 2015-04-25 NOTE — Patient Instructions (Signed)
Dehydration, Adult Dehydration is when you lose more fluids from the body than you take in. Vital organs like the kidneys, brain, and heart cannot function without a proper amount of fluids and salt. Any loss of fluids from the body can cause dehydration.  CAUSES   Vomiting.  Diarrhea.  Excessive sweating.  Excessive urine output.  Fever. SYMPTOMS  Mild dehydration  Thirst.  Dry lips.  Slightly dry mouth. Moderate dehydration  Very dry mouth.  Sunken eyes.  Skin does not bounce back quickly when lightly pinched and released.  Dark urine and decreased urine production.  Decreased tear production.  Headache. Severe dehydration  Very dry mouth.  Extreme thirst.  Rapid, weak pulse (more than 100 beats per minute at rest).  Cold hands and feet.  Not able to sweat in spite of heat and temperature.  Rapid breathing.  Blue lips.  Confusion and lethargy.  Difficulty being awakened.  Minimal urine production.  No tears. DIAGNOSIS  Your caregiver will diagnose dehydration based on your symptoms and your exam. Blood and urine tests will help confirm the diagnosis. The diagnostic evaluation should also identify the cause of dehydration. TREATMENT  Treatment of mild or moderate dehydration can often be done at home by increasing the amount of fluids that you drink. It is best to drink small amounts of fluid more often. Drinking too much at one time can make vomiting worse. Refer to the home care instructions below. Severe dehydration needs to be treated at the hospital where you will probably be given intravenous (IV) fluids that contain water and electrolytes. HOME CARE INSTRUCTIONS   Ask your caregiver about specific rehydration instructions.  Drink enough fluids to keep your urine clear or pale yellow.  Drink small amounts frequently if you have nausea and vomiting.  Eat as you normally do.  Avoid:  Foods or drinks high in sugar.  Carbonated  drinks.  Juice.  Extremely hot or cold fluids.  Drinks with caffeine.  Fatty, greasy foods.  Alcohol.  Tobacco.  Overeating.  Gelatin desserts.  Wash your hands well to avoid spreading bacteria and viruses.  Only take over-the-counter or prescription medicines for pain, discomfort, or fever as directed by your caregiver.  Ask your caregiver if you should continue all prescribed and over-the-counter medicines.  Keep all follow-up appointments with your caregiver. SEEK MEDICAL CARE IF:  You have abdominal pain and it increases or stays in one area (localizes).  You have a rash, stiff neck, or severe headache.  You are irritable, sleepy, or difficult to awaken.  You are weak, dizzy, or extremely thirsty. SEEK IMMEDIATE MEDICAL CARE IF:   You are unable to keep fluids down or you get worse despite treatment.  You have frequent episodes of vomiting or diarrhea.  You have blood or green matter (bile) in your vomit.  You have blood in your stool or your stool looks black and tarry.  You have not urinated in 6 to 8 hours, or you have only urinated a small amount of very dark urine.  You have a fever.  You faint. MAKE SURE YOU:   Understand these instructions.  Will watch your condition.  Will get help right away if you are not doing well or get worse. Document Released: 08/04/2005 Document Revised: 10/27/2011 Document Reviewed: 03/24/2011 ExitCare Patient Information 2015 [employer]. This information is not intended to replace advice given to you by your health care provider. Make sure you discuss any questions you have with your health care   provider.  

## 2015-04-25 NOTE — Progress Notes (Signed)
PAIN: He rates his pain as a 3 on a scale of 0-10. intermittent, sharp and burning over when swallowing/throat  SWALLOWING/DIET: Pt reports dysphagia for solids-he states he needs to take his time when eating. Pt reports a regular unmodified diet orally- soft. Tube- formula-Jevity, 3 cans per day, 1000 ml H20 flush. Peg tube site appearance-clean and dry, no drainage. Oral exam reveals mucous membranes moist with clear and tenacious sputum. Trach tube in place-noted yellow crusting to trach site.  Instructed pt to clean trach site bid, he hasn't been doing this.  Offered to cleanse trach today, but pt states he will do it himself later today.  BOWEL: Pt reports Nausea, a soft bowel movement every day. SKIN: Skin exam reveals Dryness, Hyperpigmentation, dry desquamation and erythema. Pt continues to apply Biafine as directed. OTHER: Pt complains of fatigue, weakness and poor appetite.   WEIGHT/VS: Wt Readings from Last 3 Encounters:  04/25/15 136 lb (61.689 kg)  04/17/15 140 lb 9.6 oz (63.776 kg)  04/16/15 142 lb 1.6 oz (64.456 kg)   BP 95/55 mmHg  Pulse 84  Temp(Src) 98.8 F (37.1 C) (Oral)  Resp 16  Wt 136 lb (61.689 kg)  SpO2 100% Orthostatic Standing Vital Signs: BP:71/44 P:101 Pox:100%

## 2015-04-25 NOTE — Assessment & Plan Note (Signed)
He tolerated treatment very well with objective response to treatment, with resolution of the lymphadenopathy. I will continue aggressive IV fluid support for this patient

## 2015-04-25 NOTE — Progress Notes (Signed)
   Weekly Management Note:  Outpatient    ICD-9-CM ICD-10-CM   1. Carcinoma of supraglottis 161.1 C32.1     Current Dose:  50 Gy  Projected Dose: 70 Gy   Narrative:  The patient presents for routine under treatment assessment.  CBCT/MVCT images/Port film x-rays were reviewed.  The chart was checked.  No new complaints but reported 3/10 soreness in throat, and poor appetite and fatigue.    Physical Findings:  Wt Readings from Last 3 Encounters:  04/25/15 136 lb (61.689 kg)  04/17/15 140 lb 9.6 oz (63.776 kg)  04/16/15 142 lb 1.6 oz (64.456 kg)    weight is 136 lb (61.689 kg). His oral temperature is 98.8 F (37.1 C). His blood pressure is 95/55 and his pulse is 84. His respiration is 16 and oxygen saturation is 100%.  moist oral mucosa, no thrush or lesions; erythematous neck, dry desquamation, crusting around trach  CBC    Component Value Date/Time   WBC 5.1 04/17/2015 1008   WBC 10.7* 03/06/2015 1030   RBC 3.44* 04/17/2015 1008   RBC 3.14* 03/06/2015 1030   HGB 11.0* 04/17/2015 1008   HGB 10.5* 03/06/2015 1030   HCT 32.6* 04/17/2015 1008   HCT 30.6* 03/06/2015 1030   PLT 255 04/17/2015 1008   PLT 518* 03/06/2015 1030   MCV 94.8 04/17/2015 1008   MCV 97.5 03/06/2015 1030   MCH 31.9 04/17/2015 1008   MCH 33.4 03/06/2015 1030   MCHC 33.6 04/17/2015 1008   MCHC 34.3 03/06/2015 1030   RDW 14.2 04/17/2015 1008   RDW 12.9 03/06/2015 1030   LYMPHSABS 0.5* 04/17/2015 1008   LYMPHSABS 0.8 03/06/2015 1030   MONOABS 0.8 04/17/2015 1008   MONOABS 1.2* 03/06/2015 1030   EOSABS 0.1 04/17/2015 1008   EOSABS 0.1 03/06/2015 1030   BASOSABS 0.0 04/17/2015 1008   BASOSABS 0.0 03/06/2015 1030     CMP     Component Value Date/Time   NA 135* 04/17/2015 1009   NA 135 02/14/2015 1439   K 3.8 04/17/2015 1009   K 4.2 02/14/2015 1439   CL 100* 02/14/2015 1439   CO2 33* 04/17/2015 1009   CO2 24 02/14/2015 1439   GLUCOSE 122 04/17/2015 1009   GLUCOSE 81 02/14/2015 1439   BUN 16.8  04/17/2015 1009   BUN <5* 02/14/2015 1439   CREATININE 0.7 04/17/2015 1009   CREATININE 0.65 02/22/2015 1731   CALCIUM 9.2 04/17/2015 1009   CALCIUM 8.5* 02/14/2015 1439   PROT 6.7 04/17/2015 1009   PROT 7.7 02/14/2015 1439   ALBUMIN 3.2* 04/17/2015 1009   ALBUMIN 3.3* 02/14/2015 1439   AST 27 04/17/2015 1009   AST 56* 02/14/2015 1439   ALT 25 04/17/2015 1009   ALT 27 02/14/2015 1439   ALKPHOS 114 04/17/2015 1009   ALKPHOS 113 02/14/2015 1439   BILITOT 0.48 04/17/2015 1009   BILITOT 0.4 02/14/2015 1439   GFRNONAA >60 02/22/2015 1731   GFRAA >60 02/22/2015 1731     Impression:  The patient is tolerating radiotherapy.   Plan:  Continue radiotherapy as planned. Pt now amenable to nursing cleaning trach Dorothea Ogle RN to do so.  Encourage taking in more cans of Jevity.  IVF per med/onc. -----------------------------------  [name], MD

## 2015-04-25 NOTE — Assessment & Plan Note (Signed)
He has started to experience side effects with nausea. As mentioned above, I will start him on IV fluids daily along with IV anti-emetics. 

## 2015-04-25 NOTE — Progress Notes (Signed)
[place] OFFICE PROGRESS NOTE  Patient Care Team: No Pcp Per Patient as PCP - General (General Practice) [name], MD as Consulting Physician (Otolaryngology) [name], MD as Attending Physician (Radiation Oncology) [name], MD as Consulting Physician (Hematology and Oncology) Leota Sauers, RN as Oncology Nurse Navigator  SUMMARY OF ONCOLOGIC HISTORY: Oncology History   Carcinoma of supraglottis   Staging form: Larynx - Supraglottis, AJCC 7th Edition     Clinical stage from 03/13/2015: Stage IVA (T3, N2b, M0) - Signed by [name], MD on 03/13/2015       Carcinoma of supraglottis   01/22/2015 Imaging CT scan showed enhancing infiltrative mass centered at the right piriform sinus with bilateral neck lymphadenopathy   02/22/2015 - 03/08/2015 Hospital Admission He was admitted to the hospital for management of advanced supraglottic cancer status post tracheostomy, biopsy, dental extraction, placement of feeding tube and subsequent discharge to skilled nursing facility   02/22/2015 Pathology Results Accession: [id number] 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   03/09/2015 PET scan 1)  Right piriform sinus mass with right level 2 nodal metastasis.  2)  Hypermetabolic thoracic nodes which are at least partially felt to be reactive.  3)  No evidence of subdiaphragmatic metastatic disease.    03/16/2015 Procedure He has placement of port   03/21/2015 -  Chemotherapy He received high dose cisplatin   03/21/2015 -  Radiation Therapy He received radiation treatment with chemotherapy    INTERVAL HISTORY: Please see below for problem oriented charting. He is seen in infusion therapy today. He complain of nausea. No recent vomiting or constipation. His pain is poorly controlled and he barely could eat anything by mouth. He is only tolerating 3 cans of  nutritional supplement per day. He is not getting help in managing his feeding tube.  REVIEW OF SYSTEMS:   Constitutional: Denies fevers, chills Eyes: Denies blurriness of vision Respiratory: Denies cough, dyspnea or wheezes Cardiovascular: Denies palpitation, chest discomfort or lower extremity swelling Skin: Denies abnormal skin rashes Lymphatics: Denies new lymphadenopathy or easy bruising Neurological:Denies numbness, tingling or new weaknesses Behavioral/Psych: Mood is stable, no new changes  All other systems were reviewed with the patient and are negative.  I have reviewed the past medical history, past surgical history, social history and family history with the patient and they are unchanged from previous note.  ALLERGIES:  has No Known Allergies.  MEDICATIONS:  Current Outpatient Prescriptions  Medication Sig Dispense Refill  . bisacodyl (DULCOLAX) 10 MG suppository Place 10 mg rectally as needed for moderate constipation.    . citalopram (CELEXA) 20 MG tablet 20 mg by PEG Tube route daily.    Marland Kitchen emollient (BIAFINE) cream Apply topically 2 (two) times daily.    . fentaNYL (DURAGESIC - DOSED MCG/HR) 50 MCG/HR Place 1 patch (50 mcg total) onto the skin every 3 (three) days. 5 patch 0  . folic acid (FOLVITE) 1 MG tablet Take 1 tablet (1 mg total) by mouth daily. (Patient taking differently: 1 mg by PEG Tube route daily. ) 30 tablet 0  . HYDROcodone-acetaminophen (HYCET) 7.5-325 mg/15 ml solution Take 15 mLs by mouth 4 (four) times daily as needed for moderate pain. 120 mL 0  . ibuprofen (ADVIL,MOTRIN) 100 MG/5ML suspension Take 200-400 mg by mouth every 4 (four) hours as needed for mild pain.     Marland Kitchen lidocaine (XYLOCAINE) 2 % solution Mix  1 part 2%viscous lidocaine,1part H2O.Swish and/or swallow 86ml of this mixture,34min before meals and at bedtime, up to QID 100 mL 5  . lidocaine-prilocaine (EMLA) cream Apply 1 application topically as needed (FOR PORT ACCESS).    . LORazepam  (ATIVAN) 1 MG tablet Take 0.5-1 mg by mouth 3 (three) times daily. TAKE 1 MG FOR 7 DAYS AND WILL CHANGE TO 1/2 TABLET (0.5MG ) ON 03/20/2015    . magnesium hydroxide (MILK OF MAGNESIA) 400 MG/5ML suspension Take 30 mLs by mouth daily as needed for mild constipation.    Marland Kitchen morphine (ROXANOL) 20 MG/ML concentrated solution Take 1 mL (20 mg total) by mouth every 2 (two) hours as needed for moderate pain or severe pain. 240 mL 0  . Multiple Vitamin (MULTIVITAMIN WITH MINERALS) TABS tablet Take 1 tablet by mouth daily. (Patient taking differently: 1 tablet by PEG Tube route daily. ) 30 tablet 0  . nicotine (NICODERM CQ - DOSED IN MG/24 HOURS) 21 mg/24hr patch Place 1 patch (21 mg total) onto the skin daily. 28 patch 0  . Nutritional Supplements (FEEDING SUPPLEMENT, JEVITY 1.2 CAL,) LIQD Give 1 can Jevity 1.2 via PEG TID between meals with 125 ml free water before and after bolus feeding.  In addition, give 250 cc free water TID with meals. 711 mL 0  . omeprazole (PRILOSEC) 20 MG capsule 20 mg by PEG Tube route daily.    . ondansetron (ZOFRAN) 8 MG tablet 8 mg by PEG Tube route every 8 (eight) hours as needed for nausea or vomiting.    . pravastatin (PRAVACHOL) 20 MG tablet 20 mg by PEG Tube route daily.    Marland Kitchen PRESCRIPTION MEDICATION CHEMO [facility]    . prochlorperazine (COMPAZINE) 10 MG tablet 10 mg by PEG Tube route every 6 (six) hours as needed for nausea or vomiting.    . sodium fluoride (PREVIDENT 5000 PLUS) 1.1 % CREA dental cream Apply thin ribbon of cream to tooth brush. Brush teeth for 2 minutes. Spit out excess-DO NOT swallow. DO NOT rinse afterwards. Repeat nightly. 1 Tube prn  . sucralfate (CARAFATE) 1 G tablet Dissolve 1 tablet in 68ml H2O and swallow up to QID,PRN sore throat. 60 tablet 5  . thiamine 100 MG tablet Take 1 tablet (100 mg total) by mouth daily. 30 tablet 0   No current facility-administered medications for this visit.   Facility-Administered Medications Ordered in Other Visits   Medication Dose Route Frequency Provider Last Rate Last Dose  . HYDROmorphone (DILAUDID) injection 2 mg  2 mg Intravenous Q2H PRN [name], MD   2 mg at 04/25/15 1119  . ondansetron (ZOFRAN) 8 mg in sodium chloride 0.9 % 50 mL IVPB   Intravenous Once [name], MD      . ondansetron (ZOFRAN) 8 mg in sodium chloride 0.9 % 50 mL IVPB   Intravenous Once [name], MD      . sodium chloride 0.9 % injection 10 mL  10 mL Intracatheter PRN Esmay Amspacher, MD   10 mL at 04/25/15 1340    PHYSICAL EXAMINATION: ECOG PERFORMANCE STATUS: 2 - Symptomatic, <50% confined to bed HR: 84 BP 95/55 RR 16 T 98.8 F  GENERAL:alert, no distress and comfortable. She looks thin and cachectic SKIN: skin color, texture, turgor are normal, no rashes or significant lesions EYES: normal, Conjunctiva are pink and non-injected, sclera clear OROPHARYNX:no exudate, no erythema and lips, buccal mucosa, and tongue normal  NECK: Tracheostomy site looks clean LYMPH:  no palpable lymphadenopathy in the  cervical, axillary or inguinal LUNGS: clear to auscultation and percussion with normal breathing effort HEART: regular rate & rhythm and no murmurs and no lower extremity edema ABDOMEN:abdomen soft, non-tender and normal bowel sounds. Feeding tube site looks okay without signs of infection. It looks loose against the abdominal wall. Musculoskeletal:no cyanosis of digits and no clubbing  NEURO: alert & oriented x 3 with fluent speech, no focal motor/sensory deficits  LABORATORY DATA:  I have reviewed the data as listed    Component Value Date/Time   NA 135* 04/17/2015 1009   NA 135 02/14/2015 1439   K 3.8 04/17/2015 1009   K 4.2 02/14/2015 1439   CL 100* 02/14/2015 1439   CO2 33* 04/17/2015 1009   CO2 24 02/14/2015 1439   GLUCOSE 122 04/17/2015 1009   GLUCOSE 81 02/14/2015 1439   BUN 16.8 04/17/2015 1009   BUN <5* 02/14/2015 1439   CREATININE 0.7 04/17/2015 1009   CREATININE 0.65 02/22/2015 1731   CALCIUM 9.2  04/17/2015 1009   CALCIUM 8.5* 02/14/2015 1439   PROT 6.7 04/17/2015 1009   PROT 7.7 02/14/2015 1439   ALBUMIN 3.2* 04/17/2015 1009   ALBUMIN 3.3* 02/14/2015 1439   AST 27 04/17/2015 1009   AST 56* 02/14/2015 1439   ALT 25 04/17/2015 1009   ALT 27 02/14/2015 1439   ALKPHOS 114 04/17/2015 1009   ALKPHOS 113 02/14/2015 1439   BILITOT 0.48 04/17/2015 1009   BILITOT 0.4 02/14/2015 1439   GFRNONAA >60 02/22/2015 1731   GFRAA >60 02/22/2015 1731    No results found for: SPEP, UPEP  Lab Results  Component Value Date   WBC 5.1 04/17/2015   NEUTROABS 3.6 04/17/2015   HGB 11.0* 04/17/2015   HCT 32.6* 04/17/2015   MCV 94.8 04/17/2015   PLT 255 04/17/2015      Chemistry      Component Value Date/Time   NA 135* 04/17/2015 1009   NA 135 02/14/2015 1439   K 3.8 04/17/2015 1009   K 4.2 02/14/2015 1439   CL 100* 02/14/2015 1439   CO2 33* 04/17/2015 1009   CO2 24 02/14/2015 1439   BUN 16.8 04/17/2015 1009   BUN <5* 02/14/2015 1439   CREATININE 0.7 04/17/2015 1009   CREATININE 0.65 02/22/2015 1731      Component Value Date/Time   CALCIUM 9.2 04/17/2015 1009   CALCIUM 8.5* 02/14/2015 1439   ALKPHOS 114 04/17/2015 1009   ALKPHOS 113 02/14/2015 1439   AST 27 04/17/2015 1009   AST 56* 02/14/2015 1439   ALT 25 04/17/2015 1009   ALT 27 02/14/2015 1439   BILITOT 0.48 04/17/2015 1009   BILITOT 0.4 02/14/2015 1439      ASSESSMENT & PLAN:  Carcinoma of supraglottis He tolerated treatment very well with objective response to treatment, with resolution of the lymphadenopathy. I will continue aggressive IV fluid support for this patient    Chemotherapy-induced nausea He has started to experience side effects with nausea. As mentioned above, I will start him on IV fluids daily along with IV anti-emetics.      Protein-calorie malnutrition, severe He has lost a lot of weight due to dysphagia. We will get him follow with speech and language therapist and follow up with  nutritionist. He is currently dependent on tube feeds long-term but is able to tolerate some oral diet. He will continue close follow-up with dietitian   S/P gastrostomy  His feeding tube looked loose. We will adjust the feeding so that is more snug  against the abdominal wall. It looks clean without signs of infection.  Mucositis due to chemotherapy His pain is not well controlled. He will continue on fentanyl patch and Roxanol for pain control. He is on regular laxatives regimen to prevent constipation.  I will increase the dose of fentanyl patch today due to poorly controlled pain from mucositis.    Dehydration He has clinical signs of dehydration with hypotension and tachycardia. We will continue aggressive IV fluid support.     No orders of the defined types were placed in this encounter.   All questions were answered. The patient knows to call the clinic with any problems, questions or concerns. No barriers to learning was detected. I spent 30 minutes counseling the patient face to face. The total time spent in the appointment was 40 minutes and more than 50% was on counseling and review of test results     [facility], [place], MD 04/25/2015 2:57 PM

## 2015-04-26 ENCOUNTER — Ambulatory Visit
Admission: RE | Admit: 2015-04-26 | Discharge: 2015-04-26 | Disposition: A | Discharge status: Home / Self Care | Payer: Medicaid Other | Source: Ambulatory Visit | Attending: Radiation Oncology | Admitting: Radiation Oncology

## 2015-04-26 ENCOUNTER — Other Ambulatory Visit: Payer: Self-pay | Admitting: *Deleted

## 2015-04-26 ENCOUNTER — Ambulatory Visit (HOSPITAL_COMMUNITY)
Admission: RE | Admit: 2015-04-26 | Discharge: 2015-04-26 | Disposition: A | Discharge status: Home / Self Care | Payer: Medicaid Other | Source: Ambulatory Visit | Attending: Hematology and Oncology | Admitting: Hematology and Oncology

## 2015-04-26 DIAGNOSIS — C321 Malignant neoplasm of supraglottis: Secondary | ICD-10-CM | POA: Diagnosis not present

## 2015-04-26 DIAGNOSIS — Z510 Encounter for antineoplastic radiation therapy: Secondary | ICD-10-CM | POA: Diagnosis not present

## 2015-04-26 MED ORDER — SODIUM CHLORIDE 0.9 % IV SOLN
8.0000 mg | Freq: Every day | INTRAVENOUS | Status: DC | PRN
  Administered 2015-04-26: 8 mg via INTRAVENOUS
  Filled 2015-04-26 (×3): qty 4

## 2015-04-26 MED ORDER — SODIUM CHLORIDE 0.9 % IJ SOLN
10.0000 mL | Freq: Once | INTRAMUSCULAR | Status: AC
  Administered 2015-04-26: 10 mL

## 2015-04-26 MED ORDER — HEPARIN SOD (PORK) LOCK FLUSH 100 UNIT/ML IV SOLN
500.0000 [IU] | Freq: Once | INTRAVENOUS | Status: AC
  Administered 2015-04-26: 500 [IU]
  Filled 2015-04-26: qty 5

## 2015-04-26 MED ORDER — HYDROMORPHONE HCL 4 MG/ML IJ SOLN
2.0000 mg | INTRAMUSCULAR | Status: DC | PRN
  Administered 2015-04-26: 2 mg via INTRAVENOUS
  Filled 2015-04-26: qty 1

## 2015-04-26 MED ORDER — SODIUM CHLORIDE 0.9 % IV SOLN
1000.0000 mL | Freq: Once | INTRAVENOUS | Status: AC
  Administered 2015-04-26: 1000 mL via INTRAVENOUS

## 2015-04-26 NOTE — Procedures (Signed)
[facility]  Procedure Note  Marvin [name] CXF:[id number] DOB: 1976/03/27 DOA: 04/26/2015   PCP: No PCP Per Patient   Associated Diagnosis: 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161.1)  Procedure Note:  IV infusion of normal saline and zofran   Condition During Procedure: Pt tolerated well  Condition at Discharge:  No complications noted   Nigel Sloop, [facility]

## 2015-04-27 ENCOUNTER — Ambulatory Visit
Admission: RE | Admit: 2015-04-27 | Discharge: 2015-04-27 | Disposition: A | Discharge status: Home / Self Care | Payer: Medicaid Other | Source: Ambulatory Visit | Attending: Radiation Oncology | Admitting: Radiation Oncology

## 2015-04-27 ENCOUNTER — Ambulatory Visit (HOSPITAL_BASED_OUTPATIENT_CLINIC_OR_DEPARTMENT_OTHER): Payer: Medicaid Other

## 2015-04-27 VITALS — BP 125/80 | HR 70 | Temp 98.4°F | Resp 20

## 2015-04-27 DIAGNOSIS — E43 Unspecified severe protein-calorie malnutrition: Secondary | ICD-10-CM

## 2015-04-27 DIAGNOSIS — R110 Nausea: Secondary | ICD-10-CM | POA: Diagnosis not present

## 2015-04-27 DIAGNOSIS — C321 Malignant neoplasm of supraglottis: Secondary | ICD-10-CM

## 2015-04-27 DIAGNOSIS — E860 Dehydration: Secondary | ICD-10-CM | POA: Diagnosis present

## 2015-04-27 DIAGNOSIS — Z510 Encounter for antineoplastic radiation therapy: Secondary | ICD-10-CM | POA: Diagnosis not present

## 2015-04-27 MED ORDER — SODIUM CHLORIDE 0.9 % IJ SOLN
10.0000 mL | INTRAMUSCULAR | Status: DC | PRN
  Administered 2015-04-27: 10 mL
  Filled 2015-04-27: qty 10

## 2015-04-27 MED ORDER — HYDROMORPHONE HCL 1 MG/ML IJ SOLN
2.0000 mg | INTRAMUSCULAR | Status: DC | PRN
  Administered 2015-04-27: 2 mg via INTRAVENOUS
  Filled 2015-04-27: qty 2

## 2015-04-27 MED ORDER — HYDROMORPHONE HCL 4 MG/ML IJ SOLN
INTRAMUSCULAR | Status: AC
  Filled 2015-04-27: qty 1

## 2015-04-27 MED ORDER — HEPARIN SOD (PORK) LOCK FLUSH 100 UNIT/ML IV SOLN
500.0000 [IU] | Freq: Once | INTRAVENOUS | Status: AC | PRN
  Administered 2015-04-27: 500 [IU]
  Filled 2015-04-27: qty 5

## 2015-04-27 MED ORDER — SODIUM CHLORIDE 0.9 % IV SOLN
Freq: Once | INTRAVENOUS | Status: AC
  Administered 2015-04-27: 10:00:00 via INTRAVENOUS

## 2015-04-27 MED ORDER — ONDANSETRON HCL 40 MG/20ML IJ SOLN
Freq: Once | INTRAMUSCULAR | Status: AC
  Administered 2015-04-27: 11:00:00 via INTRAVENOUS
  Filled 2015-04-27: qty 4

## 2015-04-27 NOTE — Patient Instructions (Signed)
Dehydration, Adult Dehydration is when you lose more fluids from the body than you take in. Vital organs like the kidneys, brain, and heart cannot function without a proper amount of fluids and salt. Any loss of fluids from the body can cause dehydration.  CAUSES   Vomiting.  Diarrhea.  Excessive sweating.  Excessive urine output.  Fever. SYMPTOMS  Mild dehydration  Thirst.  Dry lips.  Slightly dry mouth. Moderate dehydration  Very dry mouth.  Sunken eyes.  Skin does not bounce back quickly when lightly pinched and released.  Dark urine and decreased urine production.  Decreased tear production.  Headache. Severe dehydration  Very dry mouth.  Extreme thirst.  Rapid, weak pulse (more than 100 beats per minute at rest).  Cold hands and feet.  Not able to sweat in spite of heat and temperature.  Rapid breathing.  Blue lips.  Confusion and lethargy.  Difficulty being awakened.  Minimal urine production.  No tears. DIAGNOSIS  Your caregiver will diagnose dehydration based on your symptoms and your exam. Blood and urine tests will help confirm the diagnosis. The diagnostic evaluation should also identify the cause of dehydration. TREATMENT  Treatment of mild or moderate dehydration can often be done at home by increasing the amount of fluids that you drink. It is best to drink small amounts of fluid more often. Drinking too much at one time can make vomiting worse. Refer to the home care instructions below. Severe dehydration needs to be treated at the hospital where you will probably be given intravenous (IV) fluids that contain water and electrolytes. HOME CARE INSTRUCTIONS   Ask your caregiver about specific rehydration instructions.  Drink enough fluids to keep your urine clear or pale yellow.  Drink small amounts frequently if you have nausea and vomiting.  Eat as you normally do.  Avoid:  Foods or drinks high in sugar.  Carbonated  drinks.  Juice.  Extremely hot or cold fluids.  Drinks with caffeine.  Fatty, greasy foods.  Alcohol.  Tobacco.  Overeating.  Gelatin desserts.  Wash your hands well to avoid spreading bacteria and viruses.  Only take over-the-counter or prescription medicines for pain, discomfort, or fever as directed by your caregiver.  Ask your caregiver if you should continue all prescribed and over-the-counter medicines.  Keep all follow-up appointments with your caregiver. SEEK MEDICAL CARE IF:  You have abdominal pain and it increases or stays in one area (localizes).  You have a rash, stiff neck, or severe headache.  You are irritable, sleepy, or difficult to awaken.  You are weak, dizzy, or extremely thirsty. SEEK IMMEDIATE MEDICAL CARE IF:   You are unable to keep fluids down or you get worse despite treatment.  You have frequent episodes of vomiting or diarrhea.  You have blood or green matter (bile) in your vomit.  You have blood in your stool or your stool looks black and tarry.  You have not urinated in 6 to 8 hours, or you have only urinated a small amount of very dark urine.  You have a fever.  You faint. MAKE SURE YOU:   Understand these instructions.  Will watch your condition.  Will get help right away if you are not doing well or get worse. Document Released: 08/04/2005 Document Revised: 10/27/2011 Document Reviewed: 03/24/2011 ExitCare Patient Information 2015 [employer]. This information is not intended to replace advice given to you by your health care provider. Make sure you discuss any questions you have with your health care   provider.  

## 2015-04-28 ENCOUNTER — Ambulatory Visit (HOSPITAL_BASED_OUTPATIENT_CLINIC_OR_DEPARTMENT_OTHER): Payer: Medicaid Other

## 2015-04-28 VITALS — BP 112/79 | HR 79 | Temp 98.9°F | Resp 20

## 2015-04-28 DIAGNOSIS — C321 Malignant neoplasm of supraglottis: Secondary | ICD-10-CM

## 2015-04-28 DIAGNOSIS — E860 Dehydration: Secondary | ICD-10-CM | POA: Diagnosis not present

## 2015-04-28 DIAGNOSIS — Z95828 Presence of other vascular implants and grafts: Secondary | ICD-10-CM

## 2015-04-28 DIAGNOSIS — E43 Unspecified severe protein-calorie malnutrition: Secondary | ICD-10-CM

## 2015-04-28 MED ORDER — HEPARIN SOD (PORK) LOCK FLUSH 100 UNIT/ML IV SOLN
500.0000 [IU] | Freq: Once | INTRAVENOUS | Status: AC
  Administered 2015-04-28: 500 [IU] via INTRAVENOUS
  Filled 2015-04-28: qty 5

## 2015-04-28 MED ORDER — SODIUM CHLORIDE 0.9 % IJ SOLN
10.0000 mL | INTRAMUSCULAR | Status: DC | PRN
  Filled 2015-04-28: qty 10

## 2015-04-28 MED ORDER — SODIUM CHLORIDE 0.9 % IV SOLN
Freq: Once | INTRAVENOUS | Status: AC
  Administered 2015-04-28: 09:00:00 via INTRAVENOUS

## 2015-04-28 MED ORDER — HYDROMORPHONE HCL 4 MG/ML IJ SOLN
INTRAMUSCULAR | Status: AC
  Filled 2015-04-28: qty 1

## 2015-04-28 MED ORDER — HEPARIN SOD (PORK) LOCK FLUSH 100 UNIT/ML IV SOLN
500.0000 [IU] | Freq: Once | INTRAVENOUS | Status: DC | PRN
  Filled 2015-04-28: qty 5

## 2015-04-28 MED ORDER — SODIUM CHLORIDE 0.9 % IJ SOLN
10.0000 mL | INTRAMUSCULAR | Status: DC | PRN
  Administered 2015-04-28: 10 mL via INTRAVENOUS
  Filled 2015-04-28: qty 10

## 2015-04-28 MED ORDER — HYDROMORPHONE HCL 1 MG/ML IJ SOLN
2.0000 mg | INTRAMUSCULAR | Status: DC | PRN
  Administered 2015-04-28: 2 mg via INTRAVENOUS

## 2015-04-28 NOTE — Patient Instructions (Signed)

## 2015-04-30 ENCOUNTER — Ambulatory Visit
Admission: RE | Admit: 2015-04-30 | Discharge: 2015-04-30 | Disposition: A | Discharge status: Home / Self Care | Payer: Medicaid Other | Source: Ambulatory Visit | Attending: Radiation Oncology | Admitting: Radiation Oncology

## 2015-04-30 ENCOUNTER — Encounter: Payer: Self-pay | Admitting: Radiation Oncology

## 2015-04-30 ENCOUNTER — Ambulatory Visit (HOSPITAL_BASED_OUTPATIENT_CLINIC_OR_DEPARTMENT_OTHER): Payer: Medicaid Other

## 2015-04-30 VITALS — BP 99/64 | HR 91 | Temp 99.6°F | Resp 20

## 2015-04-30 VITALS — BP 82/57 | HR 155 | Temp 98.4°F | Resp 20 | Wt 131.7 lb

## 2015-04-30 DIAGNOSIS — C321 Malignant neoplasm of supraglottis: Secondary | ICD-10-CM | POA: Diagnosis not present

## 2015-04-30 DIAGNOSIS — Z510 Encounter for antineoplastic radiation therapy: Secondary | ICD-10-CM | POA: Diagnosis not present

## 2015-04-30 DIAGNOSIS — E43 Unspecified severe protein-calorie malnutrition: Secondary | ICD-10-CM

## 2015-04-30 DIAGNOSIS — E860 Dehydration: Secondary | ICD-10-CM

## 2015-04-30 MED ORDER — SODIUM CHLORIDE 0.9 % IV SOLN
Freq: Once | INTRAVENOUS | Status: AC
  Administered 2015-04-30: 11:00:00 via INTRAVENOUS

## 2015-04-30 MED ORDER — HEPARIN SOD (PORK) LOCK FLUSH 100 UNIT/ML IV SOLN
500.0000 [IU] | Freq: Once | INTRAVENOUS | Status: DC | PRN
  Filled 2015-04-30: qty 5

## 2015-04-30 MED ORDER — SODIUM CHLORIDE 0.9 % IV SOLN
Freq: Once | INTRAVENOUS | Status: AC
  Administered 2015-04-30: 12:00:00 via INTRAVENOUS
  Filled 2015-04-30: qty 4

## 2015-04-30 MED ORDER — HYDROMORPHONE HCL 4 MG/ML IJ SOLN
INTRAMUSCULAR | Status: AC
  Filled 2015-04-30: qty 1

## 2015-04-30 MED ORDER — SODIUM CHLORIDE 0.9 % IJ SOLN
10.0000 mL | INTRAMUSCULAR | Status: DC | PRN
  Filled 2015-04-30: qty 10

## 2015-04-30 MED ORDER — HYDROMORPHONE HCL 1 MG/ML IJ SOLN
2.0000 mg | INTRAMUSCULAR | Status: DC | PRN
  Administered 2015-04-30: 2 mg via INTRAVENOUS
  Filled 2015-04-30: qty 2

## 2015-04-30 MED ORDER — HEPARIN SOD (PORK) LOCK FLUSH 100 UNIT/ML IV SOLN
500.0000 [IU] | INTRAVENOUS | Status: AC | PRN
  Administered 2015-04-30: 500 [IU]
  Filled 2015-04-30: qty 5

## 2015-04-30 MED ORDER — SODIUM CHLORIDE 0.9 % IJ SOLN
10.0000 mL | INTRAMUSCULAR | Status: AC | PRN
  Administered 2015-04-30: 10 mL
  Filled 2015-04-30: qty 10

## 2015-04-30 NOTE — Patient Instructions (Signed)
Dehydration, Adult Dehydration is when you lose more fluids from the body than you take in. Vital organs like the kidneys, brain, and heart cannot function without a proper amount of fluids and salt. Any loss of fluids from the body can cause dehydration.  CAUSES   Vomiting.  Diarrhea.  Excessive sweating.  Excessive urine output.  Fever. SYMPTOMS  Mild dehydration  Thirst.  Dry lips.  Slightly dry mouth. Moderate dehydration  Very dry mouth.  Sunken eyes.  Skin does not bounce back quickly when lightly pinched and released.  Dark urine and decreased urine production.  Decreased tear production.  Headache. Severe dehydration  Very dry mouth.  Extreme thirst.  Rapid, weak pulse (more than 100 beats per minute at rest).  Cold hands and feet.  Not able to sweat in spite of heat and temperature.  Rapid breathing.  Blue lips.  Confusion and lethargy.  Difficulty being awakened.  Minimal urine production.  No tears. DIAGNOSIS  Your caregiver will diagnose dehydration based on your symptoms and your exam. Blood and urine tests will help confirm the diagnosis. The diagnostic evaluation should also identify the cause of dehydration. TREATMENT  Treatment of mild or moderate dehydration can often be done at home by increasing the amount of fluids that you drink. It is best to drink small amounts of fluid more often. Drinking too much at one time can make vomiting worse. Refer to the home care instructions below. Severe dehydration needs to be treated at the hospital where you will probably be given intravenous (IV) fluids that contain water and electrolytes. HOME CARE INSTRUCTIONS   Ask your caregiver about specific rehydration instructions.  Drink enough fluids to keep your urine clear or pale yellow.  Drink small amounts frequently if you have nausea and vomiting.  Eat as you normally do.  Avoid:  Foods or drinks high in sugar.  Carbonated  drinks.  Juice.  Extremely hot or cold fluids.  Drinks with caffeine.  Fatty, greasy foods.  Alcohol.  Tobacco.  Overeating.  Gelatin desserts.  Wash your hands well to avoid spreading bacteria and viruses.  Only take over-the-counter or prescription medicines for pain, discomfort, or fever as directed by your caregiver.  Ask your caregiver if you should continue all prescribed and over-the-counter medicines.  Keep all follow-up appointments with your caregiver. SEEK MEDICAL CARE IF:  You have abdominal pain and it increases or stays in one area (localizes).  You have a rash, stiff neck, or severe headache.  You are irritable, sleepy, or difficult to awaken.  You are weak, dizzy, or extremely thirsty. SEEK IMMEDIATE MEDICAL CARE IF:   You are unable to keep fluids down or you get worse despite treatment.  You have frequent episodes of vomiting or diarrhea.  You have blood or green matter (bile) in your vomit.  You have blood in your stool or your stool looks black and tarry.  You have not urinated in 6 to 8 hours, or you have only urinated a small amount of very dark urine.  You have a fever.  You faint. MAKE SURE YOU:   Understand these instructions.  Will watch your condition.  Will get help right away if you are not doing well or get worse. Document Released: 08/04/2005 Document Revised: 10/27/2011 Document Reviewed: 03/24/2011 ExitCare Patient Information 2015 [employer]. This information is not intended to replace advice given to you by your health care provider. Make sure you discuss any questions you have with your health care   provider.  

## 2015-04-30 NOTE — Progress Notes (Signed)
Weekly rad txs  supraglotiic 28/35  Completed, neck erythema, dry desquamation, usi9ng biafine bid, thick saliva ,uses lidocaine swish and swallow and carafate, throat pain, 4=5/10 scale, eats soft foods, jevity 1.2 TID  Via peg tube trying to increase, free water 164ml before and after feedings, took ortho static vitals, getting IVF'S after rad MON-Saturdays, will go upstairs after visit with MD ,  Sitting b/p=84/55,P=127,RR20,T=98.4 Standing b/p=82/55,P=155, light headed, fatigued 10:02 AMstanding b/p BP 82/57 mmHg  Pulse 155  Temp(Src) 98.4 F (36.9 C) (Oral)  Resp 20  Wt 131 lb 11.2 oz (59.739 kg)  Wt Readings from Last 3 Encounters:  04/30/15 131 lb 11.2 oz (59.739 kg)  04/25/15 136 lb (61.689 kg)  04/17/15 140 lb 9.6 oz (63.776 kg)

## 2015-04-30 NOTE — Progress Notes (Signed)
   Weekly Management Note:  Outpatient    ICD-9-CM ICD-10-CM   1. Carcinoma of supraglottis 161.1 C32.1     Current Dose:  56 Gy  Projected Dose: 70 Gy   Narrative:  The patient presents for routine under treatment assessment.  CBCT/MVCT images/Port film x-rays were reviewed.  The chart was checked.  Main issue is suboptimal PEG intake and weight loss  Physical Findings:  Wt Readings from Last 3 Encounters:  04/30/15 131 lb 11.2 oz (59.739 kg)  04/25/15 136 lb (61.689 kg)  04/17/15 140 lb 9.6 oz (63.776 kg)    weight is 131 lb 11.2 oz (59.739 kg). His oral temperature is 98.4 F (36.9 C). His blood pressure is 82/57 and his pulse is 155. His respiration is 20.  moist oral mucosa, no thrush or lesions; erythematous neck, dry desquamation, patches of moist desquamation  CBC    Component Value Date/Time   WBC 5.1 04/17/2015 1008   WBC 10.7* 03/06/2015 1030   RBC 3.44* 04/17/2015 1008   RBC 3.14* 03/06/2015 1030   HGB 11.0* 04/17/2015 1008   HGB 10.5* 03/06/2015 1030   HCT 32.6* 04/17/2015 1008   HCT 30.6* 03/06/2015 1030   PLT 255 04/17/2015 1008   PLT 518* 03/06/2015 1030   MCV 94.8 04/17/2015 1008   MCV 97.5 03/06/2015 1030   MCH 31.9 04/17/2015 1008   MCH 33.4 03/06/2015 1030   MCHC 33.6 04/17/2015 1008   MCHC 34.3 03/06/2015 1030   RDW 14.2 04/17/2015 1008   RDW 12.9 03/06/2015 1030   LYMPHSABS 0.5* 04/17/2015 1008   LYMPHSABS 0.8 03/06/2015 1030   MONOABS 0.8 04/17/2015 1008   MONOABS 1.2* 03/06/2015 1030   EOSABS 0.1 04/17/2015 1008   EOSABS 0.1 03/06/2015 1030   BASOSABS 0.0 04/17/2015 1008   BASOSABS 0.0 03/06/2015 1030     CMP     Component Value Date/Time   NA 135* 04/17/2015 1009   NA 135 02/14/2015 1439   K 3.8 04/17/2015 1009   K 4.2 02/14/2015 1439   CL 100* 02/14/2015 1439   CO2 33* 04/17/2015 1009   CO2 24 02/14/2015 1439   GLUCOSE 122 04/17/2015 1009   GLUCOSE 81 02/14/2015 1439   BUN 16.8 04/17/2015 1009   BUN <5* 02/14/2015 1439   CREATININE 0.7 04/17/2015 1009   CREATININE 0.65 02/22/2015 1731   CALCIUM 9.2 04/17/2015 1009   CALCIUM 8.5* 02/14/2015 1439   PROT 6.7 04/17/2015 1009   PROT 7.7 02/14/2015 1439   ALBUMIN 3.2* 04/17/2015 1009   ALBUMIN 3.3* 02/14/2015 1439   AST 27 04/17/2015 1009   AST 56* 02/14/2015 1439   ALT 25 04/17/2015 1009   ALT 27 02/14/2015 1439   ALKPHOS 114 04/17/2015 1009   ALKPHOS 113 02/14/2015 1439   BILITOT 0.48 04/17/2015 1009   BILITOT 0.4 02/14/2015 1439   GFRNONAA >60 02/22/2015 1731   GFRAA >60 02/22/2015 1731     Impression:  The patient is tolerating radiotherapy.   Plan:  Continue radiotherapy as planned. Calling in nursing for cleaning trach today. Triple antibiotic ointment given for skin. Encourage taking in more cans of Jevity. LEtter written to group home to provide this.  IVF per med/onc. -----------------------------------  [name], MD

## 2015-05-01 ENCOUNTER — Ambulatory Visit
Admission: RE | Admit: 2015-05-01 | Discharge: 2015-05-01 | Disposition: A | Discharge status: Home / Self Care | Payer: Medicaid Other | Source: Ambulatory Visit | Attending: Radiation Oncology | Admitting: Radiation Oncology

## 2015-05-01 ENCOUNTER — Ambulatory Visit (HOSPITAL_BASED_OUTPATIENT_CLINIC_OR_DEPARTMENT_OTHER): Payer: Medicaid Other

## 2015-05-01 VITALS — BP 128/81 | HR 75 | Temp 98.5°F | Resp 18

## 2015-05-01 DIAGNOSIS — C321 Malignant neoplasm of supraglottis: Secondary | ICD-10-CM

## 2015-05-01 DIAGNOSIS — E860 Dehydration: Secondary | ICD-10-CM | POA: Diagnosis present

## 2015-05-01 DIAGNOSIS — Z510 Encounter for antineoplastic radiation therapy: Secondary | ICD-10-CM | POA: Diagnosis not present

## 2015-05-01 DIAGNOSIS — E43 Unspecified severe protein-calorie malnutrition: Secondary | ICD-10-CM

## 2015-05-01 MED ORDER — HYDROMORPHONE HCL 4 MG/ML IJ SOLN
INTRAMUSCULAR | Status: AC
  Filled 2015-05-01: qty 1

## 2015-05-01 MED ORDER — HYDROMORPHONE HCL 4 MG/ML IJ SOLN
2.0000 mg | INTRAMUSCULAR | Status: DC | PRN
  Administered 2015-05-01: 2 mg via INTRAVENOUS

## 2015-05-01 MED ORDER — SODIUM CHLORIDE 0.9 % IV SOLN
Freq: Once | INTRAVENOUS | Status: AC
  Administered 2015-05-01: 11:00:00 via INTRAVENOUS
  Filled 2015-05-01: qty 4

## 2015-05-01 MED ORDER — HYDROMORPHONE HCL 1 MG/ML IJ SOLN: 2.0000 mg | INTRAMUSCULAR | Status: DC | PRN

## 2015-05-01 MED ORDER — HEPARIN SOD (PORK) LOCK FLUSH 100 UNIT/ML IV SOLN
500.0000 [IU] | Freq: Once | INTRAVENOUS | Status: AC | PRN
  Administered 2015-05-01: 500 [IU]
  Filled 2015-05-01: qty 5

## 2015-05-01 MED ORDER — SODIUM CHLORIDE 0.9 % IV SOLN
Freq: Once | INTRAVENOUS | Status: AC
  Administered 2015-05-01: 11:00:00 via INTRAVENOUS

## 2015-05-01 MED ORDER — SODIUM CHLORIDE 0.9 % IJ SOLN
10.0000 mL | INTRAMUSCULAR | Status: DC | PRN
  Administered 2015-05-01: 10 mL
  Filled 2015-05-01: qty 10

## 2015-05-01 NOTE — Patient Instructions (Signed)
Dehydration, Adult Dehydration is when you lose more fluids from the body than you take in. Vital organs like the kidneys, brain, and heart cannot function without a proper amount of fluids and salt. Any loss of fluids from the body can cause dehydration.  CAUSES   Vomiting.  Diarrhea.  Excessive sweating.  Excessive urine output.  Fever. SYMPTOMS  Mild dehydration  Thirst.  Dry lips.  Slightly dry mouth. Moderate dehydration  Very dry mouth.  Sunken eyes.  Skin does not bounce back quickly when lightly pinched and released.  Dark urine and decreased urine production.  Decreased tear production.  Headache. Severe dehydration  Very dry mouth.  Extreme thirst.  Rapid, weak pulse (more than 100 beats per minute at rest).  Cold hands and feet.  Not able to sweat in spite of heat and temperature.  Rapid breathing.  Blue lips.  Confusion and lethargy.  Difficulty being awakened.  Minimal urine production.  No tears. DIAGNOSIS  Your caregiver will diagnose dehydration based on your symptoms and your exam. Blood and urine tests will help confirm the diagnosis. The diagnostic evaluation should also identify the cause of dehydration. TREATMENT  Treatment of mild or moderate dehydration can often be done at home by increasing the amount of fluids that you drink. It is best to drink small amounts of fluid more often. Drinking too much at one time can make vomiting worse. Refer to the home care instructions below. Severe dehydration needs to be treated at the hospital where you will probably be given intravenous (IV) fluids that contain water and electrolytes. HOME CARE INSTRUCTIONS   Ask your caregiver about specific rehydration instructions.  Drink enough fluids to keep your urine clear or pale yellow.  Drink small amounts frequently if you have nausea and vomiting.  Eat as you normally do.  Avoid:  Foods or drinks high in sugar.  Carbonated  drinks.  Juice.  Extremely hot or cold fluids.  Drinks with caffeine.  Fatty, greasy foods.  Alcohol.  Tobacco.  Overeating.  Gelatin desserts.  Wash your hands well to avoid spreading bacteria and viruses.  Only take over-the-counter or prescription medicines for pain, discomfort, or fever as directed by your caregiver.  Ask your caregiver if you should continue all prescribed and over-the-counter medicines.  Keep all follow-up appointments with your caregiver. SEEK MEDICAL CARE IF:  You have abdominal pain and it increases or stays in one area (localizes).  You have a rash, stiff neck, or severe headache.  You are irritable, sleepy, or difficult to awaken.  You are weak, dizzy, or extremely thirsty. SEEK IMMEDIATE MEDICAL CARE IF:   You are unable to keep fluids down or you get worse despite treatment.  You have frequent episodes of vomiting or diarrhea.  You have blood or green matter (bile) in your vomit.  You have blood in your stool or your stool looks black and tarry.  You have not urinated in 6 to 8 hours, or you have only urinated a small amount of very dark urine.  You have a fever.  You faint. MAKE SURE YOU:   Understand these instructions.  Will watch your condition.  Will get help right away if you are not doing well or get worse. Document Released: 08/04/2005 Document Revised: 10/27/2011 Document Reviewed: 03/24/2011 ExitCare Patient Information 2015 [employer]. This information is not intended to replace advice given to you by your health care provider. Make sure you discuss any questions you have with your health care   provider.  

## 2015-05-02 ENCOUNTER — Ambulatory Visit (HOSPITAL_BASED_OUTPATIENT_CLINIC_OR_DEPARTMENT_OTHER): Payer: Medicaid Other | Admitting: Hematology and Oncology

## 2015-05-02 ENCOUNTER — Telehealth: Payer: Self-pay | Admitting: Hematology and Oncology

## 2015-05-02 ENCOUNTER — Encounter: Payer: Self-pay | Admitting: Hematology and Oncology

## 2015-05-02 ENCOUNTER — Other Ambulatory Visit: Payer: Self-pay | Admitting: Hematology and Oncology

## 2015-05-02 ENCOUNTER — Ambulatory Visit
Admission: RE | Admit: 2015-05-02 | Discharge: 2015-05-02 | Disposition: A | Discharge status: Home / Self Care | Payer: Medicaid Other | Source: Ambulatory Visit | Attending: Radiation Oncology | Admitting: Radiation Oncology

## 2015-05-02 ENCOUNTER — Encounter: Payer: Self-pay | Admitting: *Deleted

## 2015-05-02 ENCOUNTER — Telehealth: Payer: Self-pay | Admitting: *Deleted

## 2015-05-02 ENCOUNTER — Ambulatory Visit: Payer: Self-pay

## 2015-05-02 ENCOUNTER — Ambulatory Visit (HOSPITAL_BASED_OUTPATIENT_CLINIC_OR_DEPARTMENT_OTHER): Payer: Medicaid Other

## 2015-05-02 VITALS — BP 85/58 | HR 122 | Temp 98.6°F | Resp 17 | Ht 73.0 in | Wt 134.9 lb

## 2015-05-02 DIAGNOSIS — E43 Unspecified severe protein-calorie malnutrition: Secondary | ICD-10-CM

## 2015-05-02 DIAGNOSIS — R110 Nausea: Secondary | ICD-10-CM | POA: Diagnosis not present

## 2015-05-02 DIAGNOSIS — C321 Malignant neoplasm of supraglottis: Secondary | ICD-10-CM

## 2015-05-02 DIAGNOSIS — Z510 Encounter for antineoplastic radiation therapy: Secondary | ICD-10-CM | POA: Diagnosis not present

## 2015-05-02 DIAGNOSIS — I9589 Other hypotension: Secondary | ICD-10-CM

## 2015-05-02 DIAGNOSIS — R000 Tachycardia, unspecified: Secondary | ICD-10-CM

## 2015-05-02 DIAGNOSIS — Z931 Gastrostomy status: Secondary | ICD-10-CM | POA: Diagnosis not present

## 2015-05-02 DIAGNOSIS — K1231 Oral mucositis (ulcerative) due to antineoplastic therapy: Secondary | ICD-10-CM

## 2015-05-02 DIAGNOSIS — T451X5A Adverse effect of antineoplastic and immunosuppressive drugs, initial encounter: Secondary | ICD-10-CM

## 2015-05-02 DIAGNOSIS — I951 Orthostatic hypotension: Secondary | ICD-10-CM

## 2015-05-02 MED ORDER — HYDROMORPHONE HCL 1 MG/ML IJ SOLN
2.0000 mg | INTRAMUSCULAR | Status: DC | PRN
  Administered 2015-05-02: 2 mg via INTRAVENOUS
  Filled 2015-05-02: qty 2

## 2015-05-02 MED ORDER — HEPARIN SOD (PORK) LOCK FLUSH 100 UNIT/ML IV SOLN
500.0000 [IU] | Freq: Once | INTRAVENOUS | Status: AC | PRN
  Administered 2015-05-02: 500 [IU]
  Filled 2015-05-02: qty 5

## 2015-05-02 MED ORDER — SODIUM CHLORIDE 0.9 % IV SOLN
Freq: Once | INTRAVENOUS | Status: AC
  Administered 2015-05-02: 11:00:00 via INTRAVENOUS

## 2015-05-02 MED ORDER — ONDANSETRON HCL 40 MG/20ML IJ SOLN
Freq: Once | INTRAMUSCULAR | Status: AC
  Administered 2015-05-02: 11:00:00 via INTRAVENOUS
  Filled 2015-05-02: qty 4

## 2015-05-02 MED ORDER — SODIUM CHLORIDE 0.9 % IJ SOLN
10.0000 mL | INTRAMUSCULAR | Status: DC | PRN
  Administered 2015-05-02: 10 mL
  Filled 2015-05-02: qty 10

## 2015-05-02 MED ORDER — HYDROMORPHONE HCL 4 MG/ML IJ SOLN
INTRAMUSCULAR | Status: AC
  Filled 2015-05-02: qty 1

## 2015-05-02 NOTE — Assessment & Plan Note (Signed)
His pain is reasonably controlled. He will continue on fentanyl patch and Roxanol for pain control. He is on regular laxatives regimen to prevent constipation.

## 2015-05-02 NOTE — Patient Instructions (Signed)
Dehydration, Adult Dehydration is when you lose more fluids from the body than you take in. Vital organs like the kidneys, brain, and heart cannot function without a proper amount of fluids and salt. Any loss of fluids from the body can cause dehydration.  CAUSES   Vomiting.  Diarrhea.  Excessive sweating.  Excessive urine output.  Fever. SYMPTOMS  Mild dehydration  Thirst.  Dry lips.  Slightly dry mouth. Moderate dehydration  Very dry mouth.  Sunken eyes.  Skin does not bounce back quickly when lightly pinched and released.  Dark urine and decreased urine production.  Decreased tear production.  Headache. Severe dehydration  Very dry mouth.  Extreme thirst.  Rapid, weak pulse (more than 100 beats per minute at rest).  Cold hands and feet.  Not able to sweat in spite of heat and temperature.  Rapid breathing.  Blue lips.  Confusion and lethargy.  Difficulty being awakened.  Minimal urine production.  No tears. DIAGNOSIS  Your caregiver will diagnose dehydration based on your symptoms and your exam. Blood and urine tests will help confirm the diagnosis. The diagnostic evaluation should also identify the cause of dehydration. TREATMENT  Treatment of mild or moderate dehydration can often be done at home by increasing the amount of fluids that you drink. It is best to drink small amounts of fluid more often. Drinking too much at one time can make vomiting worse. Refer to the home care instructions below. Severe dehydration needs to be treated at the hospital where you will probably be given intravenous (IV) fluids that contain water and electrolytes. HOME CARE INSTRUCTIONS   Ask your caregiver about specific rehydration instructions.  Drink enough fluids to keep your urine clear or pale yellow.  Drink small amounts frequently if you have nausea and vomiting.  Eat as you normally do.  Avoid:  Foods or drinks high in sugar.  Carbonated  drinks.  Juice.  Extremely hot or cold fluids.  Drinks with caffeine.  Fatty, greasy foods.  Alcohol.  Tobacco.  Overeating.  Gelatin desserts.  Wash your hands well to avoid spreading bacteria and viruses.  Only take over-the-counter or prescription medicines for pain, discomfort, or fever as directed by your caregiver.  Ask your caregiver if you should continue all prescribed and over-the-counter medicines.  Keep all follow-up appointments with your caregiver. SEEK MEDICAL CARE IF:  You have abdominal pain and it increases or stays in one area (localizes).  You have a rash, stiff neck, or severe headache.  You are irritable, sleepy, or difficult to awaken.  You are weak, dizzy, or extremely thirsty. SEEK IMMEDIATE MEDICAL CARE IF:   You are unable to keep fluids down or you get worse despite treatment.  You have frequent episodes of vomiting or diarrhea.  You have blood or green matter (bile) in your vomit.  You have blood in your stool or your stool looks black and tarry.  You have not urinated in 6 to 8 hours, or you have only urinated a small amount of very dark urine.  You have a fever.  You faint. MAKE SURE YOU:   Understand these instructions.  Will watch your condition.  Will get help right away if you are not doing well or get worse. Document Released: 08/04/2005 Document Revised: 10/27/2011 Document Reviewed: 03/24/2011 ExitCare Patient Information 2015 [employer]. This information is not intended to replace advice given to you by your health care provider. Make sure you discuss any questions you have with your health care   provider.  

## 2015-05-02 NOTE — Telephone Encounter (Signed)
Pt confirmed labs/ov per 09/14 POF, gave pt AVS and Calendar.... KJ, sent msg to add IVF .Marland KitchenMarland KitchenMarland Kitchen

## 2015-05-02 NOTE — Telephone Encounter (Signed)
Per staff message and POF I have scheduled appts. Advised scheduler of appts. JMW  

## 2015-05-02 NOTE — Assessment & Plan Note (Signed)
He tolerated treatment very well with objective response to treatment, with resolution of the lymphadenopathy. I will continue aggressive IV fluid support for this patient He will be due for last dose of treatment at the end of the week.

## 2015-05-02 NOTE — Assessment & Plan Note (Signed)
He has lost a lot of weight due to dysphagia. We will get him follow with speech and language therapist and follow up with nutritionist. He is currently dependent on tube feeds long-term but is able to tolerate some oral diet. He will continue close follow-up with dietitian   

## 2015-05-02 NOTE — Assessment & Plan Note (Signed)
He has started to experience side effects with nausea. As mentioned above, I will start him on IV fluids daily along with IV anti-emetics. I had recently modified his antiemetics. Next week, I will add steroids therapy for 2 days after treatment for daily nausea next week.

## 2015-05-02 NOTE — Assessment & Plan Note (Signed)
His feeding tube looked loose. We will adjust the feeding so that is more snug against the abdominal wall. It looks clean without signs of infection.

## 2015-05-02 NOTE — Progress Notes (Signed)
[place] OFFICE PROGRESS NOTE  Patient Care Team: No Pcp Per Patient as PCP - General (General Practice) [name], MD as Consulting Physician (Otolaryngology) [name], MD as Attending Physician (Radiation Oncology) [name], MD as Consulting Physician (Hematology and Oncology) Leota Sauers, RN as Oncology Nurse Navigator  SUMMARY OF ONCOLOGIC HISTORY: Oncology History   Carcinoma of supraglottis   Staging form: Larynx - Supraglottis, AJCC 7th Edition     Clinical stage from 03/13/2015: Stage IVA (T3, N2b, M0) - Signed by [name], MD on 03/13/2015       Carcinoma of supraglottis   01/22/2015 Imaging CT scan showed enhancing infiltrative mass centered at the right piriform sinus with bilateral neck lymphadenopathy   02/22/2015 - 03/08/2015 Hospital Admission He was admitted to the hospital for management of advanced supraglottic cancer status post tracheostomy, biopsy, dental extraction, placement of feeding tube and subsequent discharge to skilled nursing facility   02/22/2015 Pathology Results Accession: [id number] 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   03/09/2015 PET scan 1)  Right piriform sinus mass with right level 2 nodal metastasis.  2)  Hypermetabolic thoracic nodes which are at least partially felt to be reactive.  3)  No evidence of subdiaphragmatic metastatic disease.    03/16/2015 Procedure He has placement of port   03/21/2015 -  Chemotherapy He received high dose cisplatin   03/21/2015 -  Radiation Therapy He received radiation treatment with chemotherapy    INTERVAL HISTORY: Please see below for problem oriented charting. He returns for further follow-up, to be seen prior to cycle 3 of chemotherapy. He has persistent mucositis but stated his pain is under control. He is able to tolerate some oral diet. Despite low blood  pressure, he denies symptoms of dizziness. He has some nausea but no vomiting.   REVIEW OF SYSTEMS:   Constitutional: Denies fevers, chills or abnormal weight loss Eyes: Denies blurriness of vision Respiratory: Denies cough, dyspnea or wheezes Cardiovascular: Denies palpitation, chest discomfort or lower extremity swelling Skin: Denies abnormal skin rashes Lymphatics: Denies new lymphadenopathy or easy bruising Neurological:Denies numbness, tingling or new weaknesses Behavioral/Psych: Mood is stable, no new changes  All other systems were reviewed with the patient and are negative.  I have reviewed the past medical history, past surgical history, social history and family history with the patient and they are unchanged from previous note.  ALLERGIES:  has No Known Allergies.  MEDICATIONS:  Current Outpatient Prescriptions  Medication Sig Dispense Refill  . bisacodyl (DULCOLAX) 10 MG suppository Place 10 mg rectally as needed for moderate constipation.    . citalopram (CELEXA) 20 MG tablet 20 mg by PEG Tube route daily.    Marland Kitchen emollient (BIAFINE) cream Apply topically 2 (two) times daily.    . fentaNYL (DURAGESIC - DOSED MCG/HR) 50 MCG/HR Place 1 patch (50 mcg total) onto the skin every 3 (three) days. 5 patch 0  . folic acid (FOLVITE) 1 MG tablet Take 1 tablet (1 mg total) by mouth daily. (Patient taking differently: 1 mg by PEG Tube route daily. ) 30 tablet 0  . HYDROcodone-acetaminophen (HYCET) 7.5-325 mg/15 ml solution Take 15 mLs by mouth 4 (four) times daily as needed for moderate pain. 120 mL 0  . ibuprofen (ADVIL,MOTRIN) 100 MG/5ML suspension Take 200-400 mg by mouth every 4 (four) hours as needed for mild pain.     Marland Kitchen lidocaine (XYLOCAINE)  2 % solution Mix 1 part 2%viscous lidocaine,1part H2O.Swish and/or swallow 62ml of this mixture,32min before meals and at bedtime, up to QID 100 mL 5  . lidocaine-prilocaine (EMLA) cream Apply 1 application topically as needed (FOR PORT ACCESS).     . LORazepam (ATIVAN) 1 MG tablet Take 0.5-1 mg by mouth 3 (three) times daily. TAKE 1 MG FOR 7 DAYS AND WILL CHANGE TO 1/2 TABLET (0.5MG ) ON 03/20/2015    . magnesium hydroxide (MILK OF MAGNESIA) 400 MG/5ML suspension Take 30 mLs by mouth daily as needed for mild constipation.    Marland Kitchen morphine (ROXANOL) 20 MG/ML concentrated solution Take 1 mL (20 mg total) by mouth every 2 (two) hours as needed for moderate pain or severe pain. (Patient not taking: Reported on 04/30/2015) 240 mL 0  . Multiple Vitamin (MULTIVITAMIN WITH MINERALS) TABS tablet Take 1 tablet by mouth daily. (Patient taking differently: 1 tablet by PEG Tube route daily. ) 30 tablet 0  . nicotine (NICODERM CQ - DOSED IN MG/24 HOURS) 21 mg/24hr patch Place 1 patch (21 mg total) onto the skin daily. 28 patch 0  . Nutritional Supplements (FEEDING SUPPLEMENT, JEVITY 1.2 CAL,) LIQD Give 1 can Jevity 1.2 via PEG TID between meals with 125 ml free water before and after bolus feeding.  In addition, give 250 cc free water TID with meals. 711 mL 0  . omeprazole (PRILOSEC) 20 MG capsule 20 mg by PEG Tube route daily.    . ondansetron (ZOFRAN) 8 MG tablet 8 mg by PEG Tube route every 8 (eight) hours as needed for nausea or vomiting.    . pravastatin (PRAVACHOL) 20 MG tablet 20 mg by PEG Tube route daily.    Marland Kitchen PRESCRIPTION MEDICATION CHEMO [facility]    . prochlorperazine (COMPAZINE) 10 MG tablet 10 mg by PEG Tube route every 6 (six) hours as needed for nausea or vomiting.    . sodium fluoride (PREVIDENT 5000 PLUS) 1.1 % CREA dental cream Apply thin ribbon of cream to tooth brush. Brush teeth for 2 minutes. Spit out excess-DO NOT swallow. DO NOT rinse afterwards. Repeat nightly. 1 Tube prn  . sucralfate (CARAFATE) 1 G tablet Dissolve 1 tablet in 50ml H2O and swallow up to QID,PRN sore throat. 60 tablet 5  . thiamine 100 MG tablet Take 1 tablet (100 mg total) by mouth daily. 30 tablet 0   No current facility-administered medications for this visit.    Facility-Administered Medications Ordered in Other Visits  Medication Dose Route Frequency Provider Last Rate Last Dose  . heparin lock flush 100 unit/mL  500 Units Intracatheter Once PRN [name], MD      . HYDROmorphone (DILAUDID) injection 2 mg  2 mg Intravenous Q2H PRN Elliott Lasecki, MD      . ondansetron (ZOFRAN) 8 mg in sodium chloride 0.9 % 50 mL IVPB   Intravenous Once [name], MD      . ondansetron (ZOFRAN) 8 mg in sodium chloride 0.9 % 50 mL IVPB   Intravenous Once [name], MD      . ondansetron (ZOFRAN) 8 mg in sodium chloride 0.9 % 50 mL IVPB   Intravenous Once [name], MD      . sodium chloride 0.9 % injection 10 mL  10 mL Intracatheter PRN [name], MD        PHYSICAL EXAMINATION: ECOG PERFORMANCE STATUS: 1 - Symptomatic but completely ambulatory  Filed Vitals:   05/02/15 1026  BP: 85/58  Pulse: 122  Temp: 98.6 F (  37 C)  Resp: 17   Filed Weights   05/02/15 1026  Weight: 134 lb 14.4 oz (61.19 kg)    GENERAL:alert, no distress and comfortable. He looks thin SKIN: Noted mild radiation-induced skin changes around his neck. No ulceration. EYES: normal, Conjunctiva are pink and non-injected, sclera clear OROPHARYNX:no exudate, no erythema and lips, buccal mucosa, and tongue normal  NECK: tracehostomy site looks clean  LYMPH:  no palpable lymphadenopathy in the cervical, axillary or inguinal LUNGS: clear to auscultation and percussion with normal breathing effort HEART: regular rate & rhythm and no murmurs and no lower extremity edema ABDOMEN:abdomen soft, non-tender and normal bowel sounds. Feeding tube site looks loose but no signs of infection Musculoskeletal:no cyanosis of digits and no clubbing  NEURO: alert & oriented x 3 with fluent speech, no focal motor/sensory deficits  LABORATORY DATA:  I have reviewed the data as listed    Component Value Date/Time   NA 135* 04/17/2015 1009   NA 135 02/14/2015 1439   K 3.8 04/17/2015 1009   K 4.2  02/14/2015 1439   CL 100* 02/14/2015 1439   CO2 33* 04/17/2015 1009   CO2 24 02/14/2015 1439   GLUCOSE 122 04/17/2015 1009   GLUCOSE 81 02/14/2015 1439   BUN 16.8 04/17/2015 1009   BUN <5* 02/14/2015 1439   CREATININE 0.7 04/17/2015 1009   CREATININE 0.65 02/22/2015 1731   CALCIUM 9.2 04/17/2015 1009   CALCIUM 8.5* 02/14/2015 1439   PROT 6.7 04/17/2015 1009   PROT 7.7 02/14/2015 1439   ALBUMIN 3.2* 04/17/2015 1009   ALBUMIN 3.3* 02/14/2015 1439   AST 27 04/17/2015 1009   AST 56* 02/14/2015 1439   ALT 25 04/17/2015 1009   ALT 27 02/14/2015 1439   ALKPHOS 114 04/17/2015 1009   ALKPHOS 113 02/14/2015 1439   BILITOT 0.48 04/17/2015 1009   BILITOT 0.4 02/14/2015 1439   GFRNONAA >60 02/22/2015 1731   GFRAA >60 02/22/2015 1731    No results found for: SPEP, UPEP  Lab Results  Component Value Date   WBC 5.1 04/17/2015   NEUTROABS 3.6 04/17/2015   HGB 11.0* 04/17/2015   HCT 32.6* 04/17/2015   MCV 94.8 04/17/2015   PLT 255 04/17/2015      Chemistry      Component Value Date/Time   NA 135* 04/17/2015 1009   NA 135 02/14/2015 1439   K 3.8 04/17/2015 1009   K 4.2 02/14/2015 1439   CL 100* 02/14/2015 1439   CO2 33* 04/17/2015 1009   CO2 24 02/14/2015 1439   BUN 16.8 04/17/2015 1009   BUN <5* 02/14/2015 1439   CREATININE 0.7 04/17/2015 1009   CREATININE 0.65 02/22/2015 1731      Component Value Date/Time   CALCIUM 9.2 04/17/2015 1009   CALCIUM 8.5* 02/14/2015 1439   ALKPHOS 114 04/17/2015 1009   ALKPHOS 113 02/14/2015 1439   AST 27 04/17/2015 1009   AST 56* 02/14/2015 1439   ALT 25 04/17/2015 1009   ALT 27 02/14/2015 1439   BILITOT 0.48 04/17/2015 1009   BILITOT 0.4 02/14/2015 1439      ASSESSMENT & PLAN:  Carcinoma of supraglottis He tolerated treatment very well with objective response to treatment, with resolution of the lymphadenopathy. I will continue aggressive IV fluid support for this patient He will be due for last dose of treatment at the end of the  week.      Chemotherapy-induced nausea He has started to experience side effects with nausea. As mentioned above,  I will start him on IV fluids daily along with IV anti-emetics. I had recently modified his antiemetics. Next week, I will add steroids therapy for 2 days after treatment for daily nausea next week.   Mucositis due to chemotherapy His pain is reasonably controlled. He will continue on fentanyl patch and Roxanol for pain control. He is on regular laxatives regimen to prevent constipation.    Protein-calorie malnutrition, severe He has lost a lot of weight due to dysphagia. We will get him follow with speech and language therapist and follow up with nutritionist. He is currently dependent on tube feeds long-term but is able to tolerate some oral diet. He will continue close follow-up with dietitian   S/P gastrostomy  His feeding tube looked loose. We will adjust the feeding so that is more snug against the abdominal wall. It looks clean without signs of infection.  Hypotension, postural He has clinical signs of dehydration with hypotension and tachycardia. We will continue aggressive IV fluid support.   Orders Placed This Encounter  Procedures  . Magnesium    Standing Status: Future     Number of Occurrences:      Standing Expiration Date: 06/05/2016   All questions were answered. The patient knows to call the clinic with any problems, questions or concerns. No barriers to learning was detected. I spent 30 minutes counseling the patient face to face. The total time spent in the appointment was 40 minutes and more than 50% was on counseling and review of test results     [facility], [place], MD 05/02/2015 11:05 AM

## 2015-05-02 NOTE — Assessment & Plan Note (Signed)
He has clinical signs of dehydration with hypotension and tachycardia. We will continue aggressive IV fluid support.

## 2015-05-03 ENCOUNTER — Ambulatory Visit
Admission: RE | Admit: 2015-05-03 | Discharge: 2015-05-03 | Disposition: A | Discharge status: Home / Self Care | Payer: Medicaid Other | Source: Ambulatory Visit | Attending: Radiation Oncology | Admitting: Radiation Oncology

## 2015-05-03 ENCOUNTER — Encounter: Payer: Self-pay | Admitting: Hematology and Oncology

## 2015-05-03 ENCOUNTER — Other Ambulatory Visit (HOSPITAL_BASED_OUTPATIENT_CLINIC_OR_DEPARTMENT_OTHER): Payer: Medicaid Other

## 2015-05-03 ENCOUNTER — Other Ambulatory Visit: Payer: Self-pay | Admitting: Hematology and Oncology

## 2015-05-03 ENCOUNTER — Ambulatory Visit (HOSPITAL_BASED_OUTPATIENT_CLINIC_OR_DEPARTMENT_OTHER): Payer: Medicaid Other

## 2015-05-03 VITALS — BP 113/84 | HR 69 | Temp 98.8°F | Resp 12

## 2015-05-03 DIAGNOSIS — R000 Tachycardia, unspecified: Secondary | ICD-10-CM

## 2015-05-03 DIAGNOSIS — E43 Unspecified severe protein-calorie malnutrition: Secondary | ICD-10-CM | POA: Diagnosis not present

## 2015-05-03 DIAGNOSIS — C321 Malignant neoplasm of supraglottis: Secondary | ICD-10-CM | POA: Diagnosis not present

## 2015-05-03 DIAGNOSIS — Z510 Encounter for antineoplastic radiation therapy: Secondary | ICD-10-CM | POA: Diagnosis not present

## 2015-05-03 DIAGNOSIS — I9589 Other hypotension: Secondary | ICD-10-CM

## 2015-05-03 LAB — CBC WITH DIFFERENTIAL/PLATELET
BASO%: 0.4 % (ref 0.0–2.0)
BASOS ABS: 0 10*3/uL (ref 0.0–0.1)
EOS ABS: 0.1 10*3/uL (ref 0.0–0.5)
EOS%: 5.7 % (ref 0.0–7.0)
HCT: 28.9 % — ABNORMAL LOW (ref 38.4–49.9)
HGB: 9.7 g/dL — ABNORMAL LOW (ref 13.0–17.1)
LYMPH%: 19.2 % (ref 14.0–49.0)
MCH: 31.4 pg (ref 27.2–33.4)
MCHC: 33.8 g/dL (ref 32.0–36.0)
MCV: 93.1 fL (ref 79.3–98.0)
MONO#: 0.5 10*3/uL (ref 0.1–0.9)
MONO%: 27.8 % — ABNORMAL HIGH (ref 0.0–14.0)
NEUT#: 0.9 10*3/uL — ABNORMAL LOW (ref 1.5–6.5)
NEUT%: 46.9 % (ref 39.0–75.0)
PLATELETS: 234 10*3/uL (ref 140–400)
RBC: 3.1 10*6/uL — AB (ref 4.20–5.82)
RDW: 14.1 % (ref 11.0–14.6)
WBC: 1.9 10*3/uL — ABNORMAL LOW (ref 4.0–10.3)
lymph#: 0.4 10*3/uL — ABNORMAL LOW (ref 0.9–3.3)

## 2015-05-03 LAB — COMPREHENSIVE METABOLIC PANEL (CC13)
ALT: 7 U/L (ref 0–55)
AST: 16 U/L (ref 5–34)
Albumin: 3.1 g/dL — ABNORMAL LOW (ref 3.5–5.0)
Alkaline Phosphatase: 97 U/L (ref 40–150)
Anion Gap: 7 mEq/L (ref 3–11)
BUN: 7.5 mg/dL (ref 7.0–26.0)
CHLORIDE: 102 meq/L (ref 98–109)
CO2: 31 mEq/L — ABNORMAL HIGH (ref 22–29)
Calcium: 9.5 mg/dL (ref 8.4–10.4)
Creatinine: 0.6 mg/dL — ABNORMAL LOW (ref 0.7–1.3)
EGFR: >90 mL/min/{1.73_m2} (ref >90)
GLUCOSE: 101 mg/dL (ref 70–140)
POTASSIUM: 4 meq/L (ref 3.5–5.1)
SODIUM: 140 meq/L (ref 136–145)
Total Bilirubin: 0.38 mg/dL (ref 0.20–1.20)
Total Protein: 6.8 g/dL (ref 6.4–8.3)

## 2015-05-03 LAB — MAGNESIUM (CC13): Magnesium: 1.7 mg/dl (ref 1.5–2.5)

## 2015-05-03 MED ORDER — SODIUM CHLORIDE 0.9 % IV SOLN
Freq: Once | INTRAVENOUS | Status: AC
  Administered 2015-05-03: 11:00:00 via INTRAVENOUS
  Filled 2015-05-03: qty 4

## 2015-05-03 MED ORDER — HYDROMORPHONE HCL 1 MG/ML IJ SOLN
2.0000 mg | Freq: Once | INTRAMUSCULAR | Status: AC
  Administered 2015-05-03: 2 mg via INTRAVENOUS
  Filled 2015-05-03: qty 2

## 2015-05-03 MED ORDER — HYDROMORPHONE HCL 4 MG/ML IJ SOLN
INTRAMUSCULAR | Status: AC
  Filled 2015-05-03: qty 1

## 2015-05-03 MED ORDER — HEPARIN SOD (PORK) LOCK FLUSH 100 UNIT/ML IV SOLN
500.0000 [IU] | Freq: Once | INTRAVENOUS | Status: AC | PRN
  Administered 2015-05-03: 500 [IU]
  Filled 2015-05-03: qty 5

## 2015-05-03 MED ORDER — SODIUM CHLORIDE 0.9 % IV SOLN
Freq: Once | INTRAVENOUS | Status: AC
  Administered 2015-05-03: 11:00:00 via INTRAVENOUS

## 2015-05-03 MED ORDER — SODIUM CHLORIDE 0.9 % IJ SOLN
10.0000 mL | INTRAMUSCULAR | Status: DC | PRN
  Administered 2015-05-03: 10 mL
  Filled 2015-05-03: qty 10

## 2015-05-03 NOTE — Patient Instructions (Signed)
Dehydration, Adult Dehydration is when you lose more fluids from the body than you take in. Vital organs like the kidneys, brain, and heart cannot function without a proper amount of fluids and salt. Any loss of fluids from the body can cause dehydration.  CAUSES   Vomiting.  Diarrhea.  Excessive sweating.  Excessive urine output.  Fever. SYMPTOMS  Mild dehydration  Thirst.  Dry lips.  Slightly dry mouth. Moderate dehydration  Very dry mouth.  Sunken eyes.  Skin does not bounce back quickly when lightly pinched and released.  Dark urine and decreased urine production.  Decreased tear production.  Headache. Severe dehydration  Very dry mouth.  Extreme thirst.  Rapid, weak pulse (more than 100 beats per minute at rest).  Cold hands and feet.  Not able to sweat in spite of heat and temperature.  Rapid breathing.  Blue lips.  Confusion and lethargy.  Difficulty being awakened.  Minimal urine production.  No tears. DIAGNOSIS  Your caregiver will diagnose dehydration based on your symptoms and your exam. Blood and urine tests will help confirm the diagnosis. The diagnostic evaluation should also identify the cause of dehydration. TREATMENT  Treatment of mild or moderate dehydration can often be done at home by increasing the amount of fluids that you drink. It is best to drink small amounts of fluid more often. Drinking too much at one time can make vomiting worse. Refer to the home care instructions below. Severe dehydration needs to be treated at the hospital where you will probably be given intravenous (IV) fluids that contain water and electrolytes. HOME CARE INSTRUCTIONS   Ask your caregiver about specific rehydration instructions.  Drink enough fluids to keep your urine clear or pale yellow.  Drink small amounts frequently if you have nausea and vomiting.  Eat as you normally do.  Avoid:  Foods or drinks high in sugar.  Carbonated  drinks.  Juice.  Extremely hot or cold fluids.  Drinks with caffeine.  Fatty, greasy foods.  Alcohol.  Tobacco.  Overeating.  Gelatin desserts.  Wash your hands well to avoid spreading bacteria and viruses.  Only take over-the-counter or prescription medicines for pain, discomfort, or fever as directed by your caregiver.  Ask your caregiver if you should continue all prescribed and over-the-counter medicines.  Keep all follow-up appointments with your caregiver. SEEK MEDICAL CARE IF:  You have abdominal pain and it increases or stays in one area (localizes).  You have a rash, stiff neck, or severe headache.  You are irritable, sleepy, or difficult to awaken.  You are weak, dizzy, or extremely thirsty. SEEK IMMEDIATE MEDICAL CARE IF:   You are unable to keep fluids down or you get worse despite treatment.  You have frequent episodes of vomiting or diarrhea.  You have blood or green matter (bile) in your vomit.  You have blood in your stool or your stool looks black and tarry.  You have not urinated in 6 to 8 hours, or you have only urinated a small amount of very dark urine.  You have a fever.  You faint. MAKE SURE YOU:   Understand these instructions.  Will watch your condition.  Will get help right away if you are not doing well or get worse. Document Released: 08/04/2005 Document Revised: 10/27/2011 Document Reviewed: 03/24/2011 ExitCare Patient Information 2015 [employer]. This information is not intended to replace advice given to you by your health care provider. Make sure you discuss any questions you have with your health care   provider.  

## 2015-05-03 NOTE — Progress Notes (Signed)
Checked in pt for front desk. Pt signed AOB.

## 2015-05-04 ENCOUNTER — Encounter: Payer: Self-pay | Admitting: *Deleted

## 2015-05-04 ENCOUNTER — Ambulatory Visit: Payer: Self-pay

## 2015-05-04 ENCOUNTER — Encounter: Payer: Self-pay | Admitting: Nurse Practitioner

## 2015-05-04 ENCOUNTER — Ambulatory Visit (HOSPITAL_BASED_OUTPATIENT_CLINIC_OR_DEPARTMENT_OTHER): Payer: Medicaid Other

## 2015-05-04 ENCOUNTER — Ambulatory Visit
Admission: RE | Admit: 2015-05-04 | Discharge: 2015-05-04 | Disposition: A | Discharge status: Home / Self Care | Payer: Medicaid Other | Source: Ambulatory Visit | Attending: Radiation Oncology | Admitting: Radiation Oncology

## 2015-05-04 ENCOUNTER — Ambulatory Visit: Payer: Medicaid Other | Admitting: Nutrition

## 2015-05-04 VITALS — BP 132/86 | HR 75 | Temp 98.6°F | Resp 18

## 2015-05-04 DIAGNOSIS — E43 Unspecified severe protein-calorie malnutrition: Secondary | ICD-10-CM

## 2015-05-04 DIAGNOSIS — C321 Malignant neoplasm of supraglottis: Secondary | ICD-10-CM | POA: Diagnosis not present

## 2015-05-04 DIAGNOSIS — Z5111 Encounter for antineoplastic chemotherapy: Secondary | ICD-10-CM | POA: Diagnosis not present

## 2015-05-04 DIAGNOSIS — Z510 Encounter for antineoplastic radiation therapy: Secondary | ICD-10-CM | POA: Diagnosis not present

## 2015-05-04 MED ORDER — SODIUM CHLORIDE 0.9 % IV SOLN
Freq: Once | INTRAVENOUS | Status: AC
  Administered 2015-05-04: 09:00:00 via INTRAVENOUS

## 2015-05-04 MED ORDER — SODIUM CHLORIDE 0.9 % IJ SOLN
10.0000 mL | INTRAMUSCULAR | Status: DC | PRN
  Administered 2015-05-04: 10 mL
  Filled 2015-05-04: qty 10

## 2015-05-04 MED ORDER — SODIUM CHLORIDE 0.9 % IV SOLN
Freq: Once | INTRAVENOUS | Status: AC
  Administered 2015-05-04: 16:00:00 via INTRAVENOUS

## 2015-05-04 MED ORDER — HEPARIN SOD (PORK) LOCK FLUSH 100 UNIT/ML IV SOLN
500.0000 [IU] | Freq: Once | INTRAVENOUS | Status: AC | PRN
  Administered 2015-05-04: 500 [IU]
  Filled 2015-05-04: qty 5

## 2015-05-04 MED ORDER — PALONOSETRON HCL INJECTION 0.25 MG/5ML
0.2500 mg | Freq: Once | INTRAVENOUS | Status: AC
  Administered 2015-05-04: 0.25 mg via INTRAVENOUS

## 2015-05-04 MED ORDER — SODIUM CHLORIDE 0.9 % IV SOLN
Freq: Once | INTRAVENOUS | Status: AC
  Administered 2015-05-04: 14:00:00 via INTRAVENOUS
  Filled 2015-05-04: qty 5

## 2015-05-04 MED ORDER — PALONOSETRON HCL INJECTION 0.25 MG/5ML
INTRAVENOUS | Status: AC
  Filled 2015-05-04: qty 5

## 2015-05-04 MED ORDER — HYDROMORPHONE HCL 1 MG/ML IJ SOLN
2.0000 mg | INTRAMUSCULAR | Status: DC | PRN
  Administered 2015-05-04: 2 mg via INTRAVENOUS
  Filled 2015-05-04: qty 2

## 2015-05-04 MED ORDER — SODIUM CHLORIDE 0.9 % IV SOLN
100.0000 mg/m2 | Freq: Once | INTRAVENOUS | Status: AC
  Administered 2015-05-04: 176 mg via INTRAVENOUS
  Filled 2015-05-04: qty 176

## 2015-05-04 MED ORDER — HYDROMORPHONE HCL 4 MG/ML IJ SOLN
INTRAMUSCULAR | Status: AC
Start: 2015-05-04 — End: 2015-05-04
  Filled 2015-05-04: qty 1

## 2015-05-04 MED ORDER — POTASSIUM CHLORIDE 2 MEQ/ML IV SOLN
Freq: Once | INTRAVENOUS | Status: AC
  Administered 2015-05-04: 10:00:00 via INTRAVENOUS
  Filled 2015-05-04: qty 10

## 2015-05-04 NOTE — Progress Notes (Signed)
  Oncology Nurse Navigator Documentation   Navigator Encounter Type: Treatment (05/04/15 1130)   Treatment Phase: Other (05/04/15 1130)     Met patient in Infusion where he was receiving IVF.  Assessed PEG.  Insertion site clean, dry, without signs of erythema, discharge.  Adjusted retention ring.  Obtained bottle of Boost Plus from Nutrition for his consumption during infusion. He denied needs/concerns.  [name], RN, BSN, [place] at [place] [phone]                  Time Spent with Patient: 15 (05/04/15 1130)

## 2015-05-04 NOTE — Patient Instructions (Signed)
[facility] Discharge Instructions for Patients Receiving Chemotherapy  Today you received the following chemotherapy agents: Cisplatin   To help prevent nausea and vomiting after your treatment, we encourage you to take your nausea medication as directed.    If you develop nausea and vomiting that is not controlled by your nausea medication, call the clinic.   BELOW ARE SYMPTOMS THAT SHOULD BE REPORTED IMMEDIATELY:  *FEVER GREATER THAN 100.5 F  *CHILLS WITH OR WITHOUT FEVER  NAUSEA AND VOMITING THAT IS NOT CONTROLLED WITH YOUR NAUSEA MEDICATION  *UNUSUAL SHORTNESS OF BREATH  *UNUSUAL BRUISING OR BLEEDING  TENDERNESS IN MOUTH AND THROAT WITH OR WITHOUT PRESENCE OF ULCERS  *URINARY PROBLEMS  *BOWEL PROBLEMS  UNUSUAL RASH Items with * indicate a potential emergency and should be followed up as soon as possible.  Feel free to call the clinic you have any questions or concerns. The clinic phone number is [phone].  Please show the CHEMO ALERT CARD at check-in to the Emergency Department and triage nurse.   

## 2015-05-04 NOTE — Progress Notes (Deleted)
Nursing Home Location:  [facility]   Place of Service: SNF (31)  PCP: No PCP Per Patient  No Known Allergies  Chief Complaint  Patient presents with  . Medical Management of Chronic Issues    HPI:  Patient is a 39 y.o. male seen today at [facility] ***  Review of Systems:  Review of Systems  Past Medical History  Diagnosis Date  . ETOH abuse   . Alcohol dependence 04/17/2012  . COPD (chronic obstructive pulmonary disease)   . Shortness of breath dyspnea     occ  . Anxiety   . GERD (gastroesophageal reflux disease)   . Seizures     one episode 09/2012, suspected related to ETOH withdrawal  . Anxiety disorder 03/12/2015   Past Surgical History  Procedure Laterality Date  . Knee surgery Left     kneecap -screws  . Appendectomy    . Tracheostomy tube placement N/A 02/22/2015    Procedure: TRACHEOSTOMY;  Surgeon: [name], MD;  Location: [place];  Service: ENT;  Laterality: N/A;  awake tracheostomy  . Direct laryngoscopy N/A 02/22/2015    Procedure: DIRECT LARYNGOSCOPY;  Surgeon: [name], MD;  Location: [place];  Service: ENT;  Laterality: N/A;  direct laryngoscopy with biopsy  . Multiple extractions with alveoloplasty N/A 03/05/2015    Procedure: Extraction of tooth #'s 1,2,8,9,15,16,17,18,31,32 with alveoloplasty, mandibular right lateral exostoses, and gross debridement of remaining teeth;  Surgeon: Lenn Cal, DDS;  Location: [place];  Service: Oral Surgery;  Laterality: N/A;   Social History:   reports that he quit smoking about 3 months ago. His smoking use included Cigarettes. He has a 28 pack-year smoking history. He has never used smokeless tobacco. He reports that he drinks alcohol. He reports that he does not use illicit drugs.  Family History  Problem Relation Age of Onset  . Cancer Mother     Medications: Patient's Medications  New Prescriptions   No medications on file  Previous Medications   BISACODYL  (DULCOLAX) 10 MG SUPPOSITORY    Place 10 mg rectally as needed for moderate constipation.   CITALOPRAM (CELEXA) 20 MG TABLET    20 mg by PEG Tube route daily.   EMOLLIENT (BIAFINE) CREAM    Apply topically 2 (two) times daily.   FENTANYL (DURAGESIC - DOSED MCG/HR) 50 MCG/HR    Place 1 patch (50 mcg total) onto the skin every 3 (three) days.   FOLIC ACID (FOLVITE) 1 MG TABLET    Take 1 tablet (1 mg total) by mouth daily.   HYDROCODONE-ACETAMINOPHEN (HYCET) 7.5-325 MG/15 ML SOLUTION    Take 15 mLs by mouth 4 (four) times daily as needed for moderate pain.   IBUPROFEN (ADVIL,MOTRIN) 100 MG/5ML SUSPENSION    Take 200-400 mg by mouth every 4 (four) hours as needed for mild pain.    LIDOCAINE (XYLOCAINE) 2 % SOLUTION    Mix 1 part 2%viscous lidocaine,1part H2O.Swish and/or swallow 26ml of this mixture,13min before meals and at bedtime, up to QID   LIDOCAINE-PRILOCAINE (EMLA) CREAM    Apply 1 application topically as needed (FOR PORT ACCESS).   LORAZEPAM (ATIVAN) 1 MG TABLET    Take 0.5-1 mg by mouth 3 (three) times daily. TAKE 1 MG FOR 7 DAYS AND WILL CHANGE TO 1/2 TABLET (0.5MG ) ON 03/20/2015   MAGNESIUM HYDROXIDE (MILK OF MAGNESIA) 400 MG/5ML SUSPENSION    Take 30 mLs by mouth daily as needed for mild constipation.  MORPHINE (ROXANOL) 20 MG/ML CONCENTRATED SOLUTION    Take 1 mL (20 mg total) by mouth every 2 (two) hours as needed for moderate pain or severe pain.   MULTIPLE VITAMIN (MULTIVITAMIN WITH MINERALS) TABS TABLET    Take 1 tablet by mouth daily.   NICOTINE (NICODERM CQ - DOSED IN MG/24 HOURS) 21 MG/24HR PATCH    Place 1 patch (21 mg total) onto the skin daily.   NUTRITIONAL SUPPLEMENTS (FEEDING SUPPLEMENT, JEVITY 1.2 CAL,) LIQD    Give 1 can Jevity 1.2 via PEG TID between meals with 125 ml free water before and after bolus feeding.  In addition, give 250 cc free water TID with meals.   OMEPRAZOLE (PRILOSEC) 20 MG CAPSULE    20 mg by PEG Tube route daily.   ONDANSETRON (ZOFRAN) 8 MG TABLET    8  mg by PEG Tube route every 8 (eight) hours as needed for nausea or vomiting.   PRAVASTATIN (PRAVACHOL) 20 MG TABLET    20 mg by PEG Tube route daily.   PRESCRIPTION MEDICATION    CHEMO [facility]   PROCHLORPERAZINE (COMPAZINE) 10 MG TABLET    10 mg by PEG Tube route every 6 (six) hours as needed for nausea or vomiting.   SODIUM FLUORIDE (PREVIDENT 5000 PLUS) 1.1 % CREA DENTAL CREAM    Apply thin ribbon of cream to tooth brush. Brush teeth for 2 minutes. Spit out excess-DO NOT swallow. DO NOT rinse afterwards. Repeat nightly.   SUCRALFATE (CARAFATE) 1 G TABLET    Dissolve 1 tablet in 61ml H2O and swallow up to QID,PRN sore throat.   THIAMINE 100 MG TABLET    Take 1 tablet (100 mg total) by mouth daily.  Modified Medications   No medications on file  Discontinued Medications   No medications on file     Physical Exam: Filed Vitals:   05/04/15 1533  BP: 109/71  Pulse: 81  Temp: 98.6 F (37 C)  Resp: 18  Weight: 141 lb (63.957 kg)    Physical Exam  Labs reviewed: Basic Metabolic Panel:  Recent Labs  01/21/15 2033 02/14/15 1439  04/10/15 1012 04/17/15 1009 05/03/15 0846  NA 133* 135  < > 140 135* 140  K 3.6 4.2  < > 4.8 3.8 4.0  CL 94* 100*  --   --   --   --   CO2  --  24  < > 29 33* 31*  GLUCOSE 90 81  < > 80 122 101  BUN <3* <5*  < > 9.3 16.8 7.5  CREATININE 1.20 0.46*  < > 0.7 0.7 0.6*  CALCIUM  --  8.5*  < > 9.4 9.2 9.5  MG  --   --   --   --   --  1.7  < > = values in this interval not displayed. Liver Function Tests:  Recent Labs  04/10/15 1012 04/17/15 1009 05/03/15 0846  AST 17 27 16   ALT 12 25 7   ALKPHOS 114 114 97  BILITOT 0.29 0.48 0.38  PROT 6.5 6.7 6.8  ALBUMIN 3.1* 3.2* 3.1*   No results for input(s): LIPASE, AMYLASE in the last 8760 hours. No results for input(s): AMMONIA in the last 8760 hours. CBC:  Recent Labs  04/10/15 1012 04/17/15 1008 05/03/15 0847  WBC 3.1* 5.1 1.9*  NEUTROABS 1.5 3.6 0.9*  HGB 10.3* 11.0* 9.7*  HCT 30.9* 32.6*  28.9*  MCV 95.7 94.8 93.1  PLT 229 255 234   TSH:  Recent Labs  03/01/15 0341  TSH 0.617   A1C: No results found for: HGBA1C Lipid Panel: No results for input(s): CHOL, HDL, LDLCALC, TRIG, CHOLHDL, LDLDIRECT in the last 8760 hours.  Radiological Exams: ***  Assessment/Plan There are no diagnoses linked to this encounter.    Marvin [name]. Marvin [name]  [facility] [phone] 8 am - 5 pm) [phone] (after hours)

## 2015-05-04 NOTE — Progress Notes (Signed)
Pt reports he continues to eat well and receive Jevity 1.2 1 can TID. Wt 134.5# stable.   Nutrition Dx: Unintended wt loss stable, but remains at risk.   Estimated nutrition needs: 1800-2100 kcal/day 90-102 g Protein/day and 2.2L of fluid/day  Diet order: Dysphagia 1 Honey Thick Liquids  Intervention:  Pt educated to continue diet and supplement as ordered at [place].  Will contact RD at [facility] about possible nocturnal feedings to increase kcal and protein while allowing Pt to eat more during the day.  Teach back method used.  Question answered.   Monitoring, Evaluation and Goals: Pt will work to continue increased oral intake plus enteral feedings to minimize wt loss and provide adequate hydration.   Next visit: to be scheduled in the infusion room.

## 2015-05-04 NOTE — Progress Notes (Signed)
Pt urinated 150cc total over the one liter of cisplatin hydration fluids. Dr. Alvy Bimler aware. Okay to proceed with Treatment. Pt to receive one liter NS over one hour after Cisplatin infusion. Pt aware of plan and verbalizes understanding.

## 2015-05-04 NOTE — Progress Notes (Signed)
  Oncology Nurse Navigator Documentation   Navigator Encounter Type: Clinic/MDC (05/02/15 1035) Patient Visit Type: Medonc (05/02/15 1035)     To provide support and encouragement, care continuity and to assess for needs, met with patient during Est Pt appt with Dr. Alvy Bimler. Patient reported:  Some nausea wo/ emesis.  4-5 cans supplement daily via  PEG and orally.  Eating soft foods.  Nominal throat pain that is managed with Carafate before meals, other times with hydrocodone.  He continues with Fentanyl 50 mcg for baseline pain control. He understands to contact me with future needs/concerns.  [name], RN, BSN, [place] at [place] [phone]                     Time Spent with Patient: 15 (05/02/15 1035)

## 2015-05-05 ENCOUNTER — Ambulatory Visit (HOSPITAL_BASED_OUTPATIENT_CLINIC_OR_DEPARTMENT_OTHER): Payer: Medicaid Other

## 2015-05-05 VITALS — BP 123/74 | HR 67 | Temp 98.0°F | Resp 19

## 2015-05-05 DIAGNOSIS — E43 Unspecified severe protein-calorie malnutrition: Secondary | ICD-10-CM

## 2015-05-05 DIAGNOSIS — C321 Malignant neoplasm of supraglottis: Secondary | ICD-10-CM | POA: Diagnosis not present

## 2015-05-05 MED ORDER — SODIUM CHLORIDE 0.9 % IJ SOLN
10.0000 mL | INTRAMUSCULAR | Status: DC | PRN
  Administered 2015-05-05: 10 mL
  Filled 2015-05-05: qty 10

## 2015-05-05 MED ORDER — SODIUM CHLORIDE 0.9 % IV SOLN
Freq: Once | INTRAVENOUS | Status: AC
  Administered 2015-05-05: 08:00:00 via INTRAVENOUS

## 2015-05-05 MED ORDER — HYDROMORPHONE HCL 4 MG/ML IJ SOLN
INTRAMUSCULAR | Status: AC
  Filled 2015-05-05: qty 1

## 2015-05-05 MED ORDER — HYDROMORPHONE HCL 1 MG/ML IJ SOLN
2.0000 mg | INTRAMUSCULAR | Status: DC | PRN
  Administered 2015-05-05: 2 mg via INTRAVENOUS

## 2015-05-05 MED ORDER — HEPARIN SOD (PORK) LOCK FLUSH 100 UNIT/ML IV SOLN
500.0000 [IU] | Freq: Once | INTRAVENOUS | Status: AC | PRN
  Administered 2015-05-05: 500 [IU]
  Filled 2015-05-05: qty 5

## 2015-05-05 NOTE — Patient Instructions (Signed)

## 2015-05-07 ENCOUNTER — Ambulatory Visit
Admission: RE | Admit: 2015-05-07 | Discharge: 2015-05-07 | Disposition: A | Discharge status: Home / Self Care | Payer: Medicaid Other | Source: Ambulatory Visit | Attending: Radiation Oncology | Admitting: Radiation Oncology

## 2015-05-07 ENCOUNTER — Encounter: Payer: Self-pay | Admitting: Radiation Oncology

## 2015-05-07 ENCOUNTER — Inpatient Hospital Stay: Admission: RE | Admit: 2015-05-07 | Payer: Self-pay | Source: Ambulatory Visit | Admitting: Radiation Oncology

## 2015-05-07 ENCOUNTER — Ambulatory Visit (HOSPITAL_BASED_OUTPATIENT_CLINIC_OR_DEPARTMENT_OTHER): Payer: Medicaid Other

## 2015-05-07 VITALS — BP 62/42 | HR 101 | Temp 98.7°F | Ht 73.0 in | Wt 131.2 lb

## 2015-05-07 VITALS — BP 112/79 | HR 72 | Resp 20

## 2015-05-07 DIAGNOSIS — R000 Tachycardia, unspecified: Secondary | ICD-10-CM

## 2015-05-07 DIAGNOSIS — E43 Unspecified severe protein-calorie malnutrition: Secondary | ICD-10-CM

## 2015-05-07 DIAGNOSIS — C321 Malignant neoplasm of supraglottis: Secondary | ICD-10-CM | POA: Diagnosis not present

## 2015-05-07 DIAGNOSIS — I9589 Other hypotension: Secondary | ICD-10-CM

## 2015-05-07 DIAGNOSIS — Z510 Encounter for antineoplastic radiation therapy: Secondary | ICD-10-CM | POA: Diagnosis not present

## 2015-05-07 MED ORDER — BIAFINE EX EMUL
Freq: Once | CUTANEOUS | Status: AC
  Administered 2015-05-07: 17:00:00 via TOPICAL

## 2015-05-07 MED ORDER — SODIUM CHLORIDE 0.9 % IV SOLN
Freq: Once | INTRAVENOUS | Status: AC
  Administered 2015-05-07: 12:00:00 via INTRAVENOUS
  Filled 2015-05-07: qty 4

## 2015-05-07 MED ORDER — SODIUM CHLORIDE 0.9 % IV SOLN
Freq: Once | INTRAVENOUS | Status: AC
  Administered 2015-05-07: 12:00:00 via INTRAVENOUS

## 2015-05-07 MED ORDER — SODIUM CHLORIDE 0.9 % IJ SOLN
10.0000 mL | INTRAMUSCULAR | Status: DC | PRN
  Administered 2015-05-07: 10 mL
  Filled 2015-05-07: qty 10

## 2015-05-07 MED ORDER — HYDROMORPHONE HCL 4 MG/ML IJ SOLN
2.0000 mg | INTRAMUSCULAR | Status: DC | PRN
  Administered 2015-05-07: 2 mg via INTRAVENOUS

## 2015-05-07 MED ORDER — ALTEPLASE 2 MG IJ SOLR
2.0000 mg | Freq: Once | INTRAMUSCULAR | Status: DC | PRN
  Filled 2015-05-07: qty 2

## 2015-05-07 MED ORDER — HYDROMORPHONE HCL 4 MG/ML IJ SOLN
INTRAMUSCULAR | Status: AC
  Filled 2015-05-07: qty 1

## 2015-05-07 MED ORDER — HEPARIN SOD (PORK) LOCK FLUSH 100 UNIT/ML IV SOLN
500.0000 [IU] | Freq: Once | INTRAVENOUS | Status: AC | PRN
  Administered 2015-05-07: 500 [IU]
  Filled 2015-05-07: qty 5

## 2015-05-07 NOTE — Progress Notes (Signed)
Marvin [name] has completed 33 fractions to his supra glottis.  He reports pain due to a sore throat at a 7/10 today.  He does have a fentanyl patch on and is taking hycet, lidocaine and morphine as needed for the pain.  He reports eating a pureed diet and putting about 3 cans of Jevity through his peg tube per day.  Orthostatic vitals taken: bp sitting 82/65, hr 83, bp standing 62/42, hr 101.  He is getting fluids everyday this week.  He reports finishing chemo last week.  He denies constipation and had his last bowel movement last night.  His peg tube in intact.  The area around his trach is red with beige secretions present.  The skin on his neck has hyperpigmentation and redness.  He has been using biafine and needs a refill.  Another tube has been given.    BP 82/65 mmHg  Pulse 83  Temp(Src) 98.7 F (37.1 C) (Oral)  Ht 6\' 1"  (1.854 m)  Wt 131 lb 3.2 oz (59.512 kg)  BMI 17.31 kg/m2  SpO2 98%   Wt Readings from Last 3 Encounters:  05/07/15 131 lb 3.2 oz (59.512 kg)  05/04/15 141 lb (63.957 kg)  05/02/15 134 lb 14.4 oz (61.19 kg)

## 2015-05-07 NOTE — Patient Instructions (Signed)
Dehydration, Adult Dehydration is when you lose more fluids from the body than you take in. Vital organs like the kidneys, brain, and heart cannot function without a proper amount of fluids and salt. Any loss of fluids from the body can cause dehydration.  CAUSES   Vomiting.  Diarrhea.  Excessive sweating.  Excessive urine output.  Fever. SYMPTOMS  Mild dehydration  Thirst.  Dry lips.  Slightly dry mouth. Moderate dehydration  Very dry mouth.  Sunken eyes.  Skin does not bounce back quickly when lightly pinched and released.  Dark urine and decreased urine production.  Decreased tear production.  Headache. Severe dehydration  Very dry mouth.  Extreme thirst.  Rapid, weak pulse (more than 100 beats per minute at rest).  Cold hands and feet.  Not able to sweat in spite of heat and temperature.  Rapid breathing.  Blue lips.  Confusion and lethargy.  Difficulty being awakened.  Minimal urine production.  No tears. DIAGNOSIS  Your caregiver will diagnose dehydration based on your symptoms and your exam. Blood and urine tests will help confirm the diagnosis. The diagnostic evaluation should also identify the cause of dehydration. TREATMENT  Treatment of mild or moderate dehydration can often be done at home by increasing the amount of fluids that you drink. It is best to drink small amounts of fluid more often. Drinking too much at one time can make vomiting worse. Refer to the home care instructions below. Severe dehydration needs to be treated at the hospital where you will probably be given intravenous (IV) fluids that contain water and electrolytes. HOME CARE INSTRUCTIONS   Ask your caregiver about specific rehydration instructions.  Drink enough fluids to keep your urine clear or pale yellow.  Drink small amounts frequently if you have nausea and vomiting.  Eat as you normally do.  Avoid:  Foods or drinks high in sugar.  Carbonated  drinks.  Juice.  Extremely hot or cold fluids.  Drinks with caffeine.  Fatty, greasy foods.  Alcohol.  Tobacco.  Overeating.  Gelatin desserts.  Wash your hands well to avoid spreading bacteria and viruses.  Only take over-the-counter or prescription medicines for pain, discomfort, or fever as directed by your caregiver.  Ask your caregiver if you should continue all prescribed and over-the-counter medicines.  Keep all follow-up appointments with your caregiver. SEEK MEDICAL CARE IF:  You have abdominal pain and it increases or stays in one area (localizes).  You have a rash, stiff neck, or severe headache.  You are irritable, sleepy, or difficult to awaken.  You are weak, dizzy, or extremely thirsty. SEEK IMMEDIATE MEDICAL CARE IF:   You are unable to keep fluids down or you get worse despite treatment.  You have frequent episodes of vomiting or diarrhea.  You have blood or green matter (bile) in your vomit.  You have blood in your stool or your stool looks black and tarry.  You have not urinated in 6 to 8 hours, or you have only urinated a small amount of very dark urine.  You have a fever.  You faint. MAKE SURE YOU:   Understand these instructions.  Will watch your condition.  Will get help right away if you are not doing well or get worse. Document Released: 08/04/2005 Document Revised: 10/27/2011 Document Reviewed: 03/24/2011 ExitCare Patient Information 2015 [employer]. This information is not intended to replace advice given to you by your health care provider. Make sure you discuss any questions you have with your health care   provider.  Nausea and Vomiting Nausea is a sick feeling that often comes before throwing up (vomiting). Vomiting is a reflex where stomach contents come out of your mouth. Vomiting can cause severe loss of body fluids (dehydration). Children and elderly adults can become dehydrated quickly, especially if they also have  diarrhea. Nausea and vomiting are symptoms of a condition or disease. It is important to find the cause of your symptoms. CAUSES   Direct irritation of the stomach lining. This irritation can result from increased acid production (gastroesophageal reflux disease), infection, food poisoning, taking certain medicines (such as nonsteroidal anti-inflammatory drugs), alcohol use, or tobacco use.  Signals from the brain.These signals could be caused by a headache, heat exposure, an inner ear disturbance, increased pressure in the brain from injury, infection, a tumor, or a concussion, pain, emotional stimulus, or metabolic problems.  An obstruction in the gastrointestinal tract (bowel obstruction).  Illnesses such as diabetes, hepatitis, gallbladder problems, appendicitis, kidney problems, cancer, sepsis, atypical symptoms of a heart attack, or eating disorders.  Medical treatments such as chemotherapy and radiation.  Receiving medicine that makes you sleep (general anesthetic) during surgery. DIAGNOSIS Your caregiver may ask for tests to be done if the problems do not improve after a few days. Tests may also be done if symptoms are severe or if the reason for the nausea and vomiting is not clear. Tests may include:  Urine tests.  Blood tests.  Stool tests.  Cultures (to look for evidence of infection).  X-rays or other imaging studies. Test results can help your caregiver make decisions about treatment or the need for additional tests. TREATMENT You need to stay well hydrated. Drink frequently but in small amounts.You may wish to drink water, sports drinks, clear broth, or eat frozen ice pops or gelatin dessert to help stay hydrated.When you eat, eating slowly may help prevent nausea.There are also some antinausea medicines that may help prevent nausea. HOME CARE INSTRUCTIONS   Take all medicine as directed by your caregiver.  If you do not have an appetite, do not force yourself to  eat. However, you must continue to drink fluids.  If you have an appetite, eat a normal diet unless your caregiver tells you differently.  Eat a variety of complex carbohydrates (rice, wheat, potatoes, bread), lean meats, yogurt, fruits, and vegetables.  Avoid high-fat foods because they are more difficult to digest.  Drink enough water and fluids to keep your urine clear or pale yellow.  If you are dehydrated, ask your caregiver for specific rehydration instructions. Signs of dehydration may include:  Severe thirst.  Dry lips and mouth.  Dizziness.  Dark urine.  Decreasing urine frequency and amount.  Confusion.  Rapid breathing or pulse. SEEK IMMEDIATE MEDICAL CARE IF:   You have blood or brown flecks (like coffee grounds) in your vomit.  You have black or bloody stools.  You have a severe headache or stiff neck.  You are confused.  You have severe abdominal pain.  You have chest pain or trouble breathing.  You do not urinate at least once every 8 hours.  You develop cold or clammy skin.  You continue to vomit for longer than 24 to 48 hours.  You have a fever. MAKE SURE YOU:   Understand these instructions.  Will watch your condition.  Will get help right away if you are not doing well or get worse. Document Released: 08/04/2005 Document Revised: 10/27/2011 Document Reviewed: 01/01/2011 ExitCare Patient Information 2015 [employer]. This information   is not intended to replace advice given to you by your health care provider. Make sure you discuss any questions you have with your health care provider.  

## 2015-05-07 NOTE — Progress Notes (Signed)
   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  CBCT/MVCT images/Port film x-rays were reviewed.  The chart was checked.No new complaints, but is still requiring IVF daily and has notable continued weight loss.   Physical Findings:  Wt Readings from Last 3 Encounters:  05/07/15 131 lb 3.2 oz (59.512 kg)  05/04/15 141 lb (63.957 kg)  05/02/15 134 lb 14.4 oz (61.19 kg)    height is 6\' 1"  (1.854 m) and weight is 131 lb 3.2 oz (59.512 kg). His oral temperature is 98.7 F (37.1 C). His blood pressure is 62/42 and his pulse is 101. His oxygen saturation is 98%.  moist erythematous oral mucosa, no thrush ;  Neck with dry desquamation, patches of moist desquamation  CBC    Component Value Date/Time   WBC 1.9* 05/03/2015 0847   WBC 10.7* 03/06/2015 1030   RBC 3.10* 05/03/2015 0847   RBC 3.14* 03/06/2015 1030   HGB 9.7* 05/03/2015 0847   HGB 10.5* 03/06/2015 1030   HCT 28.9* 05/03/2015 0847   HCT 30.6* 03/06/2015 1030   PLT 234 05/03/2015 0847   PLT 518* 03/06/2015 1030   MCV 93.1 05/03/2015 0847   MCV 97.5 03/06/2015 1030   MCH 31.4 05/03/2015 0847   MCH 33.4 03/06/2015 1030   MCHC 33.8 05/03/2015 0847   MCHC 34.3 03/06/2015 1030   RDW 14.1 05/03/2015 0847   RDW 12.9 03/06/2015 1030   LYMPHSABS 0.4* 05/03/2015 0847   LYMPHSABS 0.8 03/06/2015 1030   MONOABS 0.5 05/03/2015 0847   MONOABS 1.2* 03/06/2015 1030   EOSABS 0.1 05/03/2015 0847   EOSABS 0.1 03/06/2015 1030   BASOSABS 0.0 05/03/2015 0847   BASOSABS 0.0 03/06/2015 1030     CMP     Component Value Date/Time   NA 140 05/03/2015 0846   NA 135 02/14/2015 1439   K 4.0 05/03/2015 0846   K 4.2 02/14/2015 1439   CL 100* 02/14/2015 1439   CO2 31* 05/03/2015 0846   CO2 24 02/14/2015 1439   GLUCOSE 101  05/03/2015 0846   GLUCOSE 81 02/14/2015 1439   BUN 7.5 05/03/2015 0846   BUN <5* 02/14/2015 1439   CREATININE 0.6* 05/03/2015 0846   CREATININE 0.65 02/22/2015 1731   CALCIUM 9.5 05/03/2015 0846   CALCIUM 8.5* 02/14/2015 1439   PROT 6.8 05/03/2015 0846   PROT 7.7 02/14/2015 1439   ALBUMIN 3.1* 05/03/2015 0846   ALBUMIN 3.3* 02/14/2015 1439   AST 16 05/03/2015 0846   AST 56* 02/14/2015 1439   ALT 7 05/03/2015 0846   ALT 27 02/14/2015 1439   ALKPHOS 97 05/03/2015 0846   ALKPHOS 113 02/14/2015 1439   BILITOT 0.38 05/03/2015 0846   BILITOT 0.4 02/14/2015 1439   GFRNONAA >60 02/22/2015 1731   GFRAA >60 02/22/2015 1731     Impression:  The patient is tolerating radiotherapy.   Plan:  Continue radiotherapy as planned.  Nursing for cleaning trach today. Triple antibiotic ointment   for skin. Encouraged taking in more cans of Jevity.    IVF per med/onc. Notify nutritionist of weight loss. Followup in 2 weeks. -----------------------------------  [name], MD

## 2015-05-08 ENCOUNTER — Ambulatory Visit
Admission: RE | Admit: 2015-05-08 | Discharge: 2015-05-08 | Disposition: A | Discharge status: Home / Self Care | Payer: Medicaid Other | Source: Ambulatory Visit | Attending: Radiation Oncology | Admitting: Radiation Oncology

## 2015-05-08 ENCOUNTER — Ambulatory Visit (HOSPITAL_BASED_OUTPATIENT_CLINIC_OR_DEPARTMENT_OTHER): Payer: Medicaid Other

## 2015-05-08 ENCOUNTER — Emergency Department (HOSPITAL_COMMUNITY)
Admission: EM | Admit: 2015-05-08 | Discharge: 2015-05-08 | Disposition: A | Discharge status: Other Acute Inpatient Hospital | Payer: Medicaid Other | Attending: Physician Assistant | Admitting: Physician Assistant

## 2015-05-08 VITALS — BP 131/91 | HR 76 | Temp 98.4°F | Resp 18

## 2015-05-08 DIAGNOSIS — Y9389 Activity, other specified: Secondary | ICD-10-CM | POA: Insufficient documentation

## 2015-05-08 DIAGNOSIS — F419 Anxiety disorder, unspecified: Secondary | ICD-10-CM | POA: Insufficient documentation

## 2015-05-08 DIAGNOSIS — E43 Unspecified severe protein-calorie malnutrition: Secondary | ICD-10-CM

## 2015-05-08 DIAGNOSIS — Z85818 Personal history of malignant neoplasm of other sites of lip, oral cavity, and pharynx: Secondary | ICD-10-CM | POA: Insufficient documentation

## 2015-05-08 DIAGNOSIS — Z23 Encounter for immunization: Secondary | ICD-10-CM | POA: Diagnosis not present

## 2015-05-08 DIAGNOSIS — S0993XA Unspecified injury of face, initial encounter: Secondary | ICD-10-CM | POA: Diagnosis present

## 2015-05-08 DIAGNOSIS — Z87891 Personal history of nicotine dependence: Secondary | ICD-10-CM | POA: Diagnosis not present

## 2015-05-08 DIAGNOSIS — K219 Gastro-esophageal reflux disease without esophagitis: Secondary | ICD-10-CM | POA: Insufficient documentation

## 2015-05-08 DIAGNOSIS — S0181XA Laceration without foreign body of other part of head, initial encounter: Secondary | ICD-10-CM | POA: Diagnosis not present

## 2015-05-08 DIAGNOSIS — Z79899 Other long term (current) drug therapy: Secondary | ICD-10-CM | POA: Diagnosis not present

## 2015-05-08 DIAGNOSIS — Z510 Encounter for antineoplastic radiation therapy: Secondary | ICD-10-CM | POA: Diagnosis not present

## 2015-05-08 DIAGNOSIS — C321 Malignant neoplasm of supraglottis: Secondary | ICD-10-CM | POA: Diagnosis not present

## 2015-05-08 DIAGNOSIS — Y92129 Unspecified place in nursing home as the place of occurrence of the external cause: Secondary | ICD-10-CM | POA: Diagnosis not present

## 2015-05-08 DIAGNOSIS — J449 Chronic obstructive pulmonary disease, unspecified: Secondary | ICD-10-CM | POA: Diagnosis not present

## 2015-05-08 DIAGNOSIS — Y998 Other external cause status: Secondary | ICD-10-CM | POA: Insufficient documentation

## 2015-05-08 DIAGNOSIS — W01198A Fall on same level from slipping, tripping and stumbling with subsequent striking against other object, initial encounter: Secondary | ICD-10-CM | POA: Insufficient documentation

## 2015-05-08 DIAGNOSIS — IMO0002 Reserved for concepts with insufficient information to code with codable children: Secondary | ICD-10-CM

## 2015-05-08 DIAGNOSIS — S01112A Laceration without foreign body of left eyelid and periocular area, initial encounter: Secondary | ICD-10-CM | POA: Insufficient documentation

## 2015-05-08 MED ORDER — HYDROMORPHONE HCL 4 MG/ML IJ SOLN
INTRAMUSCULAR | Status: AC
  Filled 2015-05-08: qty 1

## 2015-05-08 MED ORDER — SODIUM CHLORIDE 0.9 % IJ SOLN
10.0000 mL | INTRAMUSCULAR | Status: DC | PRN
  Administered 2015-05-08: 10 mL
  Filled 2015-05-08: qty 10

## 2015-05-08 MED ORDER — SODIUM CHLORIDE 0.9 % IV SOLN
Freq: Once | INTRAVENOUS | Status: AC
  Administered 2015-05-08: 11:00:00 via INTRAVENOUS
  Filled 2015-05-08: qty 4

## 2015-05-08 MED ORDER — TETANUS-DIPHTH-ACELL PERTUSSIS 5-2.5-18.5 LF-MCG/0.5 IM SUSP
0.5000 mL | Freq: Once | INTRAMUSCULAR | Status: AC
  Administered 2015-05-08: 0.5 mL via INTRAMUSCULAR
  Filled 2015-05-08: qty 0.5

## 2015-05-08 MED ORDER — LIDOCAINE HCL 1 % IJ SOLN
30.0000 mL | Freq: Once | INTRAMUSCULAR | Status: AC
  Administered 2015-05-08: 30 mL
  Filled 2015-05-08: qty 40

## 2015-05-08 MED ORDER — BACITRACIN ZINC 500 UNIT/GM EX OINT
TOPICAL_OINTMENT | Freq: Two times a day (BID) | CUTANEOUS | Status: DC
  Administered 2015-05-08: 1 via TOPICAL
  Filled 2015-05-08: qty 0.9

## 2015-05-08 MED ORDER — HEPARIN SOD (PORK) LOCK FLUSH 100 UNIT/ML IV SOLN
500.0000 [IU] | Freq: Once | INTRAVENOUS | Status: AC | PRN
  Administered 2015-05-08: 500 [IU]
  Filled 2015-05-08: qty 5

## 2015-05-08 MED ORDER — SODIUM CHLORIDE 0.9 % IV SOLN
Freq: Once | INTRAVENOUS | Status: AC
  Administered 2015-05-08: 11:00:00 via INTRAVENOUS

## 2015-05-08 MED ORDER — MORPHINE SULFATE (CONCENTRATE) 10 MG/0.5ML PO SOLN
20.0000 mg | Freq: Once | ORAL | Status: AC
  Administered 2015-05-08: 20 mg via ORAL
  Filled 2015-05-08: qty 1

## 2015-05-08 MED ORDER — HYDROMORPHONE HCL 1 MG/ML IJ SOLN
2.0000 mg | INTRAMUSCULAR | Status: DC | PRN
  Administered 2015-05-08: 2 mg via INTRAVENOUS
  Filled 2015-05-08: qty 2

## 2015-05-08 NOTE — ED Notes (Signed)
Bed: FT73 Expected date:  Expected time:  Means of arrival:  Comments: EMS 39 yo male slipped and fell and hit head against wall

## 2015-05-08 NOTE — ED Provider Notes (Signed)
CSN: [id number]     Arrival date & time 05/08/15  0729 History   First MD Initiated Contact with Patient 05/08/15 [phone]     Chief Complaint  Patient presents with  . Fall  . Facial Laceration     (Consider location/radiation/quality/duration/timing/severity/associated sxs/prior Treatment) HPI   Ptis a 39 year old with history of supraglottic carcinoma and subsequent trach presenting with mechanical fall today.  He tripped over his new slippers, and struck his head. No LOC, not on blood thinners, no headache.   Past Medical History  Diagnosis Date  . ETOH abuse   . Alcohol dependence 04/17/2012  . COPD (chronic obstructive pulmonary disease)   . Shortness of breath dyspnea     occ  . Anxiety   . GERD (gastroesophageal reflux disease)   . Seizures     one episode 09/2012, suspected related to ETOH withdrawal  . Anxiety disorder 03/12/2015   Past Surgical History  Procedure Laterality Date  . Knee surgery Left     kneecap -screws  . Appendectomy    . Tracheostomy tube placement N/A 02/22/2015    Procedure: TRACHEOSTOMY;  Surgeon: [name], MD;  Location: [facility];  Service: ENT;  Laterality: N/A;  awake tracheostomy  . Direct laryngoscopy N/A 02/22/2015    Procedure: DIRECT LARYNGOSCOPY;  Surgeon: [name], MD;  Location: [facility];  Service: ENT;  Laterality: N/A;  direct laryngoscopy with biopsy  . Multiple extractions with alveoloplasty N/A 03/05/2015    Procedure: Extraction of tooth #'s 1,2,8,9,15,16,17,18,31,32 with alveoloplasty, mandibular right lateral exostoses, and gross debridement of remaining teeth;  Surgeon: Lenn Cal, DDS;  Location: [facility];  Service: Oral Surgery;  Laterality: N/A;   Family History  Problem Relation Age of Onset  . Cancer Mother    Social History  Substance Use Topics  . Smoking status: Former Smoker -- 1.00 packs/day for 28 years    Types: Cigarettes    Quit date: 01/17/2015  . Smokeless tobacco: Never Used  . Alcohol Use: 0.0 oz/week     0 Standard drinks or equivalent per week     Comment: 6 -40-oz. beers daily    Review of Systems  Constitutional: Negative for activity change.  HENT: Negative for facial swelling.   Respiratory: Negative for shortness of breath.   Cardiovascular: Negative for chest pain.  Gastrointestinal: Negative for abdominal pain.      Allergies  Review of patient's allergies indicates no known allergies.  Home Medications   Prior to Admission medications   Medication Sig Start Date End Date Taking? Authorizing Provider  bisacodyl (DULCOLAX) 10 MG suppository Place 10 mg rectally as needed for moderate constipation.    Historical Provider, MD  citalopram (CELEXA) 20 MG tablet 20 mg by PEG Tube route daily.    Historical Provider, MD  emollient (BIAFINE) cream Apply topically 2 (two) times daily.    Historical Provider, MD  fentaNYL (DURAGESIC - DOSED MCG/HR) 50 MCG/HR Place 1 patch (50 mcg total) onto the skin every 3 (three) days. 04/25/15   [name], MD  folic acid (FOLVITE) 1 MG tablet Take 1 tablet (1 mg total) by mouth daily. Patient taking differently: 1 mg by PEG Tube route daily.  03/08/15   [name], MD  HYDROcodone-acetaminophen (HYCET) 7.5-325 mg/15 ml solution Take 15 mLs by mouth 4 (four) times daily as needed for moderate pain. 03/08/15   [name], MD  ibuprofen (ADVIL,MOTRIN) 100 MG/5ML suspension Take 200-400 mg by mouth every 4 (four) hours  as needed for mild pain.     Historical Provider, MD  lidocaine (XYLOCAINE) 2 % solution Mix 1 part 2%viscous lidocaine,1part H2O.Swish and/or swallow 60ml of this mixture,35min before meals and at bedtime, up to QID 04/02/15   [name], MD  lidocaine-prilocaine (EMLA) cream Apply 1 application topically as needed (FOR PORT ACCESS).    Historical Provider, MD  LORazepam (ATIVAN) 1 MG tablet Take 0.5-1 mg by mouth 3 (three) times daily. TAKE 1 MG FOR 7 DAYS AND WILL CHANGE TO 1/2 TABLET (0.5MG ) ON 03/20/2015    Historical Provider,  MD  magnesium hydroxide (MILK OF MAGNESIA) 400 MG/5ML suspension Take 30 mLs by mouth daily as needed for mild constipation.    Historical Provider, MD  morphine (ROXANOL) 20 MG/ML concentrated solution Take 1 mL (20 mg total) by mouth every 2 (two) hours as needed for moderate pain or severe pain. 03/26/15   [name], MD  Multiple Vitamin (MULTIVITAMIN WITH MINERALS) TABS tablet Take 1 tablet by mouth daily. Patient taking differently: 1 tablet by PEG Tube route daily.  03/08/15   [name], MD  nicotine (NICODERM CQ - DOSED IN MG/24 HOURS) 21 mg/24hr patch Place 1 patch (21 mg total) onto the skin daily. 03/08/15   [name], MD  Nutritional Supplements (FEEDING SUPPLEMENT, JEVITY 1.2 CAL,) LIQD Give 1 can Jevity 1.2 via PEG TID between meals with 125 ml free water before and after bolus feeding.  In addition, give 250 cc free water TID with meals. 03/27/15   [name], MD  omeprazole (PRILOSEC) 20 MG capsule 20 mg by PEG Tube route daily.    Historical Provider, MD  ondansetron (ZOFRAN) 8 MG tablet 8 mg by PEG Tube route every 8 (eight) hours as needed for nausea or vomiting.    Historical Provider, MD  pravastatin (PRAVACHOL) 20 MG tablet 20 mg by PEG Tube route daily.    Historical Provider, MD  [place]    Historical Provider, MD  prochlorperazine (COMPAZINE) 10 MG tablet 10 mg by PEG Tube route every 6 (six) hours as needed for nausea or vomiting.    Historical Provider, MD  sodium fluoride (PREVIDENT 5000 PLUS) 1.1 % CREA dental cream Apply thin ribbon of cream to tooth brush. Brush teeth for 2 minutes. Spit out excess-DO NOT swallow. DO NOT rinse afterwards. Repeat nightly. 04/03/15   Lenn Cal, DDS  sucralfate (CARAFATE) 1 G tablet Dissolve 1 tablet in 94ml H2O and swallow up to QID,PRN sore throat. 04/02/15   [name], MD  thiamine 100 MG tablet Take 1 tablet (100 mg total) by mouth daily. 03/08/15   [name], MD   SpO2 96% Physical Exam   Constitutional: He is oriented to person, place, and time. He appears well-nourished.  HENT:  Head: Normocephalic.  Mouth/Throat: Oropharynx is clear and moist.  5 cm deep laceration to above left eyebrow.  Trach present and functioning  Eyes: Conjunctivae are normal.  Neck: No tracheal deviation present.  Cardiovascular: Normal rate.   Pulmonary/Chest: Effort normal. No stridor. No respiratory distress.  Abdominal: Soft. There is no tenderness. There is no guarding.  Musculoskeletal: Normal range of motion. He exhibits no edema.  Bilateral normal ROM in LE and UE  Neurological: He is oriented to person, place, and time. No cranial nerve deficit.  Skin: Skin is warm and dry. No rash noted. He is not diaphoretic.  Laceration to left forehead, otherwise no external sign of trauma.  Psychiatric: He has a  normal mood and affect. His behavior is normal.  Nursing note and vitals reviewed.   ED Course  Procedures (including critical care time) Labs Review Labs Reviewed - No data to display  Imaging Review No results found. I have personally reviewed and evaluated these images and lab results as part of my medical decision-making.   EKG Interpretation None      MDM   Final diagnoses:  None   Patient is a 39 year old male with trach living in a nursing home presenting today after a mechanical fall. Patient struck his head. Did not loose consciousness. Not on blood thinners. No altered level consciousness. No vomiting. All these help me determine that we will not CT of his head at this time. Patient does have a  laceration to left eyebrow. We'll repair. We'll give Tdap and plan to discharge back.    LACERATION REPAIR Performed by: Gardiner Sleeper Authorized by: Gardiner Sleeper Consent: Verbal consent obtained. Risks and benefits: risks, benefits and alternatives were discussed Consent given by: patient Patient identity confirmed: provided demographic data Prepped and  Draped in normal sterile fashion Wound explored  Laceration Location: L eyebrow   Laceration Length:6 cm  No Foreign Bodies seen or palpated  Anesthesia: local infiltration  Local anesthetic: lidocaine 1% with epinephrine  Anesthetic total: 7 ml  Irrigation method: syringe Amount of cleaning: standard  Skin closure: 5-0 vicryl deep X 4 \ 5-0 nylon superficial X 6  Number of sutures: 10   COMPLICATED LACERATION REPAIR, multilevel, jagged.  Patient tolerance: Patient tolerated the procedure well with no immediate complications.  Courteney Julio Alm, MD 05/08/15 [phone]

## 2015-05-08 NOTE — ED Notes (Signed)
Pt from [facility].  Pt is new there at the facility d/t being on daily cancer treatments.  He states he slipped when going to the bathroom during the night hours but reported it this am to staff.  Ems states his story of why he fell changed a few times.  Pt c/o pain to L knee & L foot and has small lac above L eye.

## 2015-05-08 NOTE — Patient Instructions (Signed)
Dehydration, Adult Dehydration is when you lose more fluids from the body than you take in. Vital organs like the kidneys, brain, and heart cannot function without a proper amount of fluids and salt. Any loss of fluids from the body can cause dehydration.  CAUSES   Vomiting.  Diarrhea.  Excessive sweating.  Excessive urine output.  Fever. SYMPTOMS  Mild dehydration  Thirst.  Dry lips.  Slightly dry mouth. Moderate dehydration  Very dry mouth.  Sunken eyes.  Skin does not bounce back quickly when lightly pinched and released.  Dark urine and decreased urine production.  Decreased tear production.  Headache. Severe dehydration  Very dry mouth.  Extreme thirst.  Rapid, weak pulse (more than 100 beats per minute at rest).  Cold hands and feet.  Not able to sweat in spite of heat and temperature.  Rapid breathing.  Blue lips.  Confusion and lethargy.  Difficulty being awakened.  Minimal urine production.  No tears. DIAGNOSIS  Your caregiver will diagnose dehydration based on your symptoms and your exam. Blood and urine tests will help confirm the diagnosis. The diagnostic evaluation should also identify the cause of dehydration. TREATMENT  Treatment of mild or moderate dehydration can often be done at home by increasing the amount of fluids that you drink. It is best to drink small amounts of fluid more often. Drinking too much at one time can make vomiting worse. Refer to the home care instructions below. Severe dehydration needs to be treated at the hospital where you will probably be given intravenous (IV) fluids that contain water and electrolytes. HOME CARE INSTRUCTIONS   Ask your caregiver about specific rehydration instructions.  Drink enough fluids to keep your urine clear or pale yellow.  Drink small amounts frequently if you have nausea and vomiting.  Eat as you normally do.  Avoid:  Foods or drinks high in sugar.  Carbonated  drinks.  Juice.  Extremely hot or cold fluids.  Drinks with caffeine.  Fatty, greasy foods.  Alcohol.  Tobacco.  Overeating.  Gelatin desserts.  Wash your hands well to avoid spreading bacteria and viruses.  Only take over-the-counter or prescription medicines for pain, discomfort, or fever as directed by your caregiver.  Ask your caregiver if you should continue all prescribed and over-the-counter medicines.  Keep all follow-up appointments with your caregiver. SEEK MEDICAL CARE IF:  You have abdominal pain and it increases or stays in one area (localizes).  You have a rash, stiff neck, or severe headache.  You are irritable, sleepy, or difficult to awaken.  You are weak, dizzy, or extremely thirsty. SEEK IMMEDIATE MEDICAL CARE IF:   You are unable to keep fluids down or you get worse despite treatment.  You have frequent episodes of vomiting or diarrhea.  You have blood or green matter (bile) in your vomit.  You have blood in your stool or your stool looks black and tarry.  You have not urinated in 6 to 8 hours, or you have only urinated a small amount of very dark urine.  You have a fever.  You faint. MAKE SURE YOU:   Understand these instructions.  Will watch your condition.  Will get help right away if you are not doing well or get worse. Document Released: 08/04/2005 Document Revised: 10/27/2011 Document Reviewed: 03/24/2011 ExitCare Patient Information 2015 [employer]. This information is not intended to replace advice given to you by your health care provider. Make sure you discuss any questions you have with your health care   provider.  

## 2015-05-09 ENCOUNTER — Ambulatory Visit (HOSPITAL_BASED_OUTPATIENT_CLINIC_OR_DEPARTMENT_OTHER): Payer: Medicaid Other

## 2015-05-09 ENCOUNTER — Emergency Department (HOSPITAL_COMMUNITY): Payer: Medicaid Other

## 2015-05-09 ENCOUNTER — Emergency Department (HOSPITAL_COMMUNITY)
Admission: EM | Admit: 2015-05-09 | Discharge: 2015-05-09 | Disposition: A | Discharge status: Home / Self Care | Payer: Medicaid Other | Attending: Emergency Medicine | Admitting: Emergency Medicine

## 2015-05-09 ENCOUNTER — Encounter: Payer: Self-pay | Admitting: Hematology and Oncology

## 2015-05-09 ENCOUNTER — Ambulatory Visit (HOSPITAL_BASED_OUTPATIENT_CLINIC_OR_DEPARTMENT_OTHER): Payer: Medicaid Other | Admitting: Hematology and Oncology

## 2015-05-09 ENCOUNTER — Ambulatory Visit
Admission: RE | Admit: 2015-05-09 | Discharge: 2015-05-09 | Disposition: A | Discharge status: Home / Self Care | Payer: Medicaid Other | Source: Ambulatory Visit | Attending: Radiation Oncology | Admitting: Radiation Oncology

## 2015-05-09 ENCOUNTER — Encounter: Payer: Self-pay | Admitting: Radiation Oncology

## 2015-05-09 ENCOUNTER — Encounter (HOSPITAL_COMMUNITY): Payer: Self-pay | Admitting: Emergency Medicine

## 2015-05-09 VITALS — BP 114/68 | HR 79

## 2015-05-09 DIAGNOSIS — J449 Chronic obstructive pulmonary disease, unspecified: Secondary | ICD-10-CM | POA: Diagnosis not present

## 2015-05-09 DIAGNOSIS — Z87891 Personal history of nicotine dependence: Secondary | ICD-10-CM | POA: Diagnosis not present

## 2015-05-09 DIAGNOSIS — F419 Anxiety disorder, unspecified: Secondary | ICD-10-CM | POA: Insufficient documentation

## 2015-05-09 DIAGNOSIS — Z9889 Other specified postprocedural states: Secondary | ICD-10-CM | POA: Diagnosis not present

## 2015-05-09 DIAGNOSIS — K1231 Oral mucositis (ulcerative) due to antineoplastic therapy: Secondary | ICD-10-CM | POA: Diagnosis not present

## 2015-05-09 DIAGNOSIS — C321 Malignant neoplasm of supraglottis: Secondary | ICD-10-CM | POA: Diagnosis not present

## 2015-05-09 DIAGNOSIS — Z79899 Other long term (current) drug therapy: Secondary | ICD-10-CM | POA: Diagnosis not present

## 2015-05-09 DIAGNOSIS — R519 Headache, unspecified: Secondary | ICD-10-CM

## 2015-05-09 DIAGNOSIS — E43 Unspecified severe protein-calorie malnutrition: Secondary | ICD-10-CM | POA: Diagnosis not present

## 2015-05-09 DIAGNOSIS — R110 Nausea: Secondary | ICD-10-CM

## 2015-05-09 DIAGNOSIS — R51 Headache: Secondary | ICD-10-CM | POA: Diagnosis not present

## 2015-05-09 DIAGNOSIS — Z510 Encounter for antineoplastic radiation therapy: Secondary | ICD-10-CM | POA: Diagnosis not present

## 2015-05-09 DIAGNOSIS — T451X5A Adverse effect of antineoplastic and immunosuppressive drugs, initial encounter: Secondary | ICD-10-CM

## 2015-05-09 MED ORDER — SODIUM CHLORIDE 0.9 % IV SOLN
Freq: Once | INTRAVENOUS | Status: AC
  Administered 2015-05-09: 11:00:00 via INTRAVENOUS
  Filled 2015-05-09: qty 4

## 2015-05-09 MED ORDER — ONDANSETRON 4 MG PO TBDP
4.0000 mg | ORAL_TABLET | Freq: Once | ORAL | Status: AC
  Administered 2015-05-09: 4 mg via ORAL
  Filled 2015-05-09: qty 1

## 2015-05-09 MED ORDER — ACETAMINOPHEN 325 MG PO TABS
650.0000 mg | ORAL_TABLET | Freq: Once | ORAL | Status: AC
  Administered 2015-05-09: 650 mg via ORAL
  Filled 2015-05-09: qty 2

## 2015-05-09 MED ORDER — ALTEPLASE 2 MG IJ SOLR
2.0000 mg | Freq: Once | INTRAMUSCULAR | Status: DC | PRN
  Filled 2015-05-09: qty 2

## 2015-05-09 MED ORDER — HYDROMORPHONE HCL 1 MG/ML IJ SOLN
2.0000 mg | Freq: Once | INTRAMUSCULAR | Status: AC
  Administered 2015-05-09: 2 mg via INTRAVENOUS
  Filled 2015-05-09: qty 2

## 2015-05-09 MED ORDER — SODIUM CHLORIDE 0.9 % IJ SOLN
10.0000 mL | INTRAMUSCULAR | Status: DC | PRN
  Filled 2015-05-09: qty 10

## 2015-05-09 MED ORDER — SODIUM CHLORIDE 0.9 % IV SOLN
Freq: Once | INTRAVENOUS | Status: AC
  Administered 2015-05-09: 11:00:00 via INTRAVENOUS

## 2015-05-09 MED ORDER — HEPARIN SOD (PORK) LOCK FLUSH 100 UNIT/ML IV SOLN
250.0000 [IU] | Freq: Once | INTRAVENOUS | Status: DC | PRN
  Filled 2015-05-09: qty 5

## 2015-05-09 MED ORDER — HEPARIN SOD (PORK) LOCK FLUSH 100 UNIT/ML IV SOLN
500.0000 [IU] | Freq: Once | INTRAVENOUS | Status: DC | PRN
  Filled 2015-05-09: qty 5

## 2015-05-09 MED ORDER — HYDROMORPHONE HCL 4 MG/ML IJ SOLN
INTRAMUSCULAR | Status: AC
  Filled 2015-05-09: qty 1

## 2015-05-09 NOTE — Assessment & Plan Note (Signed)
He has lost a lot of weight due to dysphagia. We will get him follow with speech and language therapist and follow up with nutritionist. He is currently dependent on tube feeds long-term but is able to tolerate some oral diet. He will continue close follow-up with dietitian   

## 2015-05-09 NOTE — Assessment & Plan Note (Signed)
His pain is reasonably controlled. He will continue on fentanyl patch and Roxanol for pain control. He is on regular laxatives regimen to prevent constipation.

## 2015-05-09 NOTE — Assessment & Plan Note (Signed)
He has started to experience side effects with nausea. As mentioned above, I will start him on IV fluids daily along with IV anti-emetics. 

## 2015-05-09 NOTE — Progress Notes (Signed)
[place] OFFICE PROGRESS NOTE  Patient Care Team: No Pcp Per Patient as PCP - General (General Practice) [name], MD as Consulting Physician (Otolaryngology) [name], MD as Attending Physician (Radiation Oncology) [name], MD as Consulting Physician (Hematology and Oncology) Leota Sauers, RN as Oncology Nurse Navigator  SUMMARY OF ONCOLOGIC HISTORY: Oncology History   Carcinoma of supraglottis   Staging form: Larynx - Supraglottis, AJCC 7th Edition     Clinical stage from 03/13/2015: Stage IVA (T3, N2b, M0) - Signed by [name], MD on 03/13/2015       Carcinoma of supraglottis   01/22/2015 Imaging CT scan showed enhancing infiltrative mass centered at the right piriform sinus with bilateral neck lymphadenopathy   02/22/2015 - 03/08/2015 Hospital Admission He was admitted to the hospital for management of advanced supraglottic cancer status post tracheostomy, biopsy, dental extraction, placement of feeding tube and subsequent discharge to skilled nursing facility   02/22/2015 Pathology Results Accession: [id number] 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   03/09/2015 PET scan 1)  Right piriform sinus mass with right level 2 nodal metastasis.  2)  Hypermetabolic thoracic nodes which are at least partially felt to be reactive.  3)  No evidence of subdiaphragmatic metastatic disease.    03/16/2015 Procedure He has placement of port   03/21/2015 - 05/04/2015 Chemotherapy He received high dose cisplatin x 3 cycles   03/21/2015 - 05/09/2015 Radiation Therapy He received radiation treatment with chemotherapy    INTERVAL HISTORY: Please see below for problem oriented charting. The patient completed all his treatment today. He was at the emergency department due to accidental fall and laceration of the left for head. He denies further bleeding. Then  mucositis pain and nausea are persistent but they are slightly improved compared to last visit  REVIEW OF SYSTEMS:   Constitutional: Denies fevers, chills or abnormal weight loss Eyes: Denies blurriness of vision Respiratory: Denies cough, dyspnea or wheezes Cardiovascular: Denies palpitation, chest discomfort or lower extremity swelling Skin: Denies abnormal skin rashes Lymphatics: Denies new lymphadenopathy or easy bruising Neurological:Denies numbness, tingling or new weaknesses Behavioral/Psych: Mood is stable, no new changes  All other systems were reviewed with the patient and are negative.  I have reviewed the past medical history, past surgical history, social history and family history with the patient and they are unchanged from previous note.  ALLERGIES:  has No Known Allergies.  MEDICATIONS:  Current Outpatient Prescriptions  Medication Sig Dispense Refill  . bisacodyl (DULCOLAX) 10 MG suppository Place 10 mg rectally as needed for moderate constipation.    . citalopram (CELEXA) 20 MG tablet 20 mg by PEG Tube route daily.    . feeding supplement, ENSURE, (ENSURE) PUDG Take 1 Container by mouth 3 (three) times daily between meals.    . fentaNYL (DURAGESIC - DOSED MCG/HR) 50 MCG/HR Place 1 patch (50 mcg total) onto the skin every 3 (three) days. 5 patch 0  . folic acid (FOLVITE) 1 MG tablet Take 1 tablet (1 mg total) by mouth daily. (Patient taking differently: 1 mg by PEG Tube route daily. ) 30 tablet 0  . HYDROcodone-acetaminophen (HYCET) 7.5-325 mg/15 ml solution Take 15 mLs by mouth 4 (four) times daily as needed for moderate pain. 120 mL 0  . ibuprofen (ADVIL,MOTRIN) 100 MG/5ML suspension Take 200-400 mg by mouth every 4 (four) hours as needed for mild pain.     Marland Kitchen  lidocaine (XYLOCAINE) 2 % solution Mix 1 part 2%viscous lidocaine,1part H2O.Swish and/or swallow 13ml of this mixture,78min before meals and at bedtime, up to QID (Patient taking differently: Mix 1 part 2%viscous  lidocaine,1part H2O.Swish and/or swallow 50ml of this mixture,63min before meals and at bedtime, up to QID as needed for sore throat) 100 mL 5  . lidocaine-prilocaine (EMLA) cream Apply 1 application topically as needed (FOR PORT ACCESS).    . LORazepam (ATIVAN) 1 MG tablet Take 0.5 mg by mouth 3 (three) times daily.     . magnesium hydroxide (MILK OF MAGNESIA) 400 MG/5ML suspension Take 30 mLs by mouth daily as needed for mild constipation.    Marland Kitchen morphine (ROXANOL) 20 MG/ML concentrated solution Take 1 mL (20 mg total) by mouth every 2 (two) hours as needed for moderate pain or severe pain. 240 mL 0  . Multiple Vitamin (MULTIVITAMIN WITH MINERALS) TABS tablet Take 1 tablet by mouth daily. (Patient taking differently: 1 tablet by PEG Tube route daily. ) 30 tablet 0  . nicotine (NICODERM CQ - DOSED IN MG/24 HOURS) 21 mg/24hr patch Place 1 patch (21 mg total) onto the skin daily. (Patient not taking: Reported on 05/08/2015) 28 patch 0  . Nutritional Supplements (FEEDING SUPPLEMENT, JEVITY 1.2 CAL,) LIQD Give 1 can Jevity 1.2 via PEG TID between meals with 125 ml free water before and after bolus feeding.  In addition, give 250 cc free water TID with meals. 711 mL 0  . omeprazole (PRILOSEC) 20 MG capsule 20 mg by PEG Tube route daily.    . ondansetron (ZOFRAN) 8 MG tablet 8 mg by PEG Tube route every 8 (eight) hours as needed for nausea or vomiting.    . pravastatin (PRAVACHOL) 20 MG tablet 20 mg by PEG Tube route daily.    Marland Kitchen PRESCRIPTION MEDICATION CHEMO [facility]    . prochlorperazine (COMPAZINE) 10 MG tablet 10 mg by PEG Tube route every 6 (six) hours as needed for nausea or vomiting.    . sodium fluoride (PREVIDENT 5000 PLUS) 1.1 % CREA dental cream Apply thin ribbon of cream to tooth brush. Brush teeth for 2 minutes. Spit out excess-DO NOT swallow. DO NOT rinse afterwards. Repeat nightly. 1 Tube prn  . sodium phosphate (FLEET) enema Place 1 enema rectally once as needed (for constipation). follow package  directions    . sucralfate (CARAFATE) 1 G tablet Dissolve 1 tablet in 90ml H2O and swallow up to QID,PRN sore throat. 60 tablet 5  . thiamine 100 MG tablet Take 1 tablet (100 mg total) by mouth daily. (Patient taking differently: 100 mg by PEG Tube route daily. ) 30 tablet 0   No current facility-administered medications for this visit.   Facility-Administered Medications Ordered in Other Visits  Medication Dose Route Frequency Provider Last Rate Last Dose  . 0.9 %  sodium chloride infusion   Intravenous Once [name], MD 500 mL/hr at 05/09/15 1103    . alteplase (CATHFLO ACTIVASE) injection 2 mg  2 mg Intracatheter Once PRN [name], MD      . heparin lock flush 100 unit/mL  500 Units Intracatheter Once PRN [name], MD      . heparin lock flush 100 unit/mL  250 Units Intracatheter Once PRN [name], MD      . ondansetron (ZOFRAN) 8 mg in sodium chloride 0.9 % 50 mL IVPB   Intravenous Once [name], MD      . ondansetron (ZOFRAN) 8 mg in sodium chloride 0.9 % 50  mL IVPB   Intravenous Once [name], MD      . sodium chloride 0.9 % injection 10 mL  10 mL Intracatheter PRN [name], MD        PHYSICAL EXAMINATION: ECOG PERFORMANCE STATUS: 1 - Symptomatic but completely ambulatory GENERAL:alert, no distress and comfortable. He looks thin SKIN: skin color, texture, turgor are normal, no rashes or significant lesions. Noticed bandage on the left forehead EYES: normal, Conjunctiva are pink and non-injected, sclera clear OROPHARYNX:no exudate, no erythema and lips, buccal mucosa, and tongue normal  NECK: supple, thyroid normal size, non-tender, without nodularity LYMPH:  no palpable lymphadenopathy in the cervical, axillary or inguinal LUNGS: clear to auscultation and percussion with normal breathing effort HEART: regular rate & rhythm and no murmurs and no lower extremity edema ABDOMEN:abdomen soft, non-tender and normal bowel sounds. Feeding tube site looks  okay Musculoskeletal:no cyanosis of digits and no clubbing  NEURO: alert & oriented x 3 with fluent speech, no focal motor/sensory deficits  LABORATORY DATA:  I have reviewed the data as listed    Component Value Date/Time   NA 140 05/03/2015 0846   NA 135 02/14/2015 1439   K 4.0 05/03/2015 0846   K 4.2 02/14/2015 1439   CL 100* 02/14/2015 1439   CO2 31* 05/03/2015 0846   CO2 24 02/14/2015 1439   GLUCOSE 101 05/03/2015 0846   GLUCOSE 81 02/14/2015 1439   BUN 7.5 05/03/2015 0846   BUN <5* 02/14/2015 1439   CREATININE 0.6* 05/03/2015 0846   CREATININE 0.65 02/22/2015 1731   CALCIUM 9.5 05/03/2015 0846   CALCIUM 8.5* 02/14/2015 1439   PROT 6.8 05/03/2015 0846   PROT 7.7 02/14/2015 1439   ALBUMIN 3.1* 05/03/2015 0846   ALBUMIN 3.3* 02/14/2015 1439   AST 16 05/03/2015 0846   AST 56* 02/14/2015 1439   ALT 7 05/03/2015 0846   ALT 27 02/14/2015 1439   ALKPHOS 97 05/03/2015 0846   ALKPHOS 113 02/14/2015 1439   BILITOT 0.38 05/03/2015 0846   BILITOT 0.4 02/14/2015 1439   GFRNONAA >60 02/22/2015 1731   GFRAA >60 02/22/2015 1731    No results found for: SPEP, UPEP  Lab Results  Component Value Date   WBC 1.9* 05/03/2015   NEUTROABS 0.9* 05/03/2015   HGB 9.7* 05/03/2015   HCT 28.9* 05/03/2015   MCV 93.1 05/03/2015   PLT 234 05/03/2015      Chemistry      Component Value Date/Time   NA 140 05/03/2015 0846   NA 135 02/14/2015 1439   K 4.0 05/03/2015 0846   K 4.2 02/14/2015 1439   CL 100* 02/14/2015 1439   CO2 31* 05/03/2015 0846   CO2 24 02/14/2015 1439   BUN 7.5 05/03/2015 0846   BUN <5* 02/14/2015 1439   CREATININE 0.6* 05/03/2015 0846   CREATININE 0.65 02/22/2015 1731      Component Value Date/Time   CALCIUM 9.5 05/03/2015 0846   CALCIUM 8.5* 02/14/2015 1439   ALKPHOS 97 05/03/2015 0846   ALKPHOS 113 02/14/2015 1439   AST 16 05/03/2015 0846   AST 56* 02/14/2015 1439   ALT 7 05/03/2015 0846   ALT 27 02/14/2015 1439   BILITOT 0.38 05/03/2015 0846    BILITOT 0.4 02/14/2015 1439      ASSESSMENT & PLAN:  Carcinoma of supraglottis He tolerated treatment very well with objective response to treatment, with resolution of the lymphadenopathy. I will continue aggressive IV fluid support for him   Mucositis due to chemotherapy His  pain is reasonably controlled. He will continue on fentanyl patch and Roxanol for pain control. He is on regular laxatives regimen to prevent constipation.   Chemotherapy-induced nausea He has started to experience side effects with nausea. As mentioned above, I will start him on IV fluids daily along with IV anti-emetics.  Protein-calorie malnutrition, severe He has lost a lot of weight due to dysphagia. We will get him follow with speech and language therapist and follow up with nutritionist. He is currently dependent on tube feeds long-term but is able to tolerate some oral diet. He will continue close follow-up with dietitian     No orders of the defined types were placed in this encounter.   All questions were answered. The patient knows to call the clinic with any problems, questions or concerns. No barriers to learning was detected. I spent 15 minutes counseling the patient face to face. The total time spent in the appointment was 20 minutes and more than 50% was on counseling and review of test results     [facility], [place], MD 05/09/2015 12:03 PM

## 2015-05-09 NOTE — ED Notes (Signed)
Pt fell Monday night, hit head on metal plate on wall. Gash to forehead, pt was brought to and examined in the ER, was sent home. Today complaining of progressive nausea/vomiting/migraines that are worse today. States 4 episodes of vomiting since 3 pm, pt from [place] rehab facility for cancer.

## 2015-05-09 NOTE — ED Notes (Signed)
Report called to [facility].

## 2015-05-09 NOTE — ED Notes (Signed)
[facility] called for transport.  

## 2015-05-09 NOTE — ED Provider Notes (Signed)
CSN: [id number]     Arrival date & time 05/09/15  1825 History   First MD Initiated Contact with Patient 05/09/15 1843     Chief Complaint  Patient presents with  . Head Injury  . Migraine  . Nausea  . Cancer     (Consider location/radiation/quality/duration/timing/severity/associated sxs/prior Treatment) HPI Marvin [name] is a 39 y.o. male who suffered from a mechanical fall on Monday night, sustaining a left frontal laceration repaired in the ED area patient reports since that time he has had increasing headaches in this area as well as nausea and vomiting. Patient denies any fevers, chills, numbness or weakness, vision changes  Past Medical History  Diagnosis Date  . ETOH abuse   . Alcohol dependence 04/17/2012  . COPD (chronic obstructive pulmonary disease)   . Shortness of breath dyspnea     occ  . Anxiety   . GERD (gastroesophageal reflux disease)   . Seizures     one episode 09/2012, suspected related to ETOH withdrawal  . Anxiety disorder 03/12/2015   Past Surgical History  Procedure Laterality Date  . Knee surgery Left     kneecap -screws  . Appendectomy    . Tracheostomy tube placement N/A 02/22/2015    Procedure: TRACHEOSTOMY;  Surgeon: [name], MD;  Location: [place];  Service: ENT;  Laterality: N/A;  awake tracheostomy  . Direct laryngoscopy N/A 02/22/2015    Procedure: DIRECT LARYNGOSCOPY;  Surgeon: [name], MD;  Location: [place];  Service: ENT;  Laterality: N/A;  direct laryngoscopy with biopsy  . Multiple extractions with alveoloplasty N/A 03/05/2015    Procedure: Extraction of tooth #'s 1,2,8,9,15,16,17,18,31,32 with alveoloplasty, mandibular right lateral exostoses, and gross debridement of remaining teeth;  Surgeon: Lenn Cal, DDS;  Location: [place];  Service: Oral Surgery;  Laterality: N/A;   Family History  Problem Relation Age of Onset  . Cancer Mother    Social History  Substance Use Topics  . Smoking status: Former Smoker -- 1.00  packs/day for 28 years    Types: Cigarettes    Quit date: 01/17/2015  . Smokeless tobacco: Never Used  . Alcohol Use: 0.0 oz/week    0 Standard drinks or equivalent per week     Comment: 6 -40-oz. beers daily    Review of Systems A 10 point review of systems was completed and was negative except for pertinent positives and negatives as mentioned in the history of present illness     Allergies  Review of patient's allergies indicates no known allergies.  Home Medications   Prior to Admission medications   Medication Sig Start Date End Date Taking? Authorizing Provider  bisacodyl (DULCOLAX) 10 MG suppository Place 10 mg rectally as needed for moderate constipation.   Yes Historical Provider, MD  citalopram (CELEXA) 20 MG tablet 20 mg by PEG Tube route daily.   Yes Historical Provider, MD  feeding supplement, ENSURE, (ENSURE) PUDG Take 1 Container by mouth 3 (three) times daily between meals.   Yes Historical Provider, MD  fentaNYL (DURAGESIC - DOSED MCG/HR) 50 MCG/HR Place 1 patch (50 mcg total) onto the skin every 3 (three) days. 04/25/15  Yes [name], MD  folic acid (FOLVITE) 1 MG tablet Take 1 tablet (1 mg total) by mouth daily. Patient taking differently: 1 mg by PEG Tube route daily.  03/08/15  Yes [name], MD  HYDROcodone-acetaminophen (HYCET) 7.5-325 mg/15 ml solution Take 15 mLs by mouth 4 (four) times daily as needed for moderate pain. 03/08/15  Yes [name], MD  ibuprofen (ADVIL,MOTRIN) 100 MG/5ML suspension Take 200-400 mg by mouth every 4 (four) hours as needed for mild pain.    Yes Historical Provider, MD  lidocaine (XYLOCAINE) 2 % solution Mix 1 part 2%viscous lidocaine,1part H2O.Swish and/or swallow 4ml of this mixture,34min before meals and at bedtime, up to QID Patient taking differently: Mix 1 part 2%viscous lidocaine,1part H2O.Swish and/or swallow 57ml of this mixture,45min before meals and at bedtime, up to QID as needed for sore throat 04/02/15  Yes [name], MD  lidocaine-prilocaine (EMLA) cream Apply 1 application topically as needed (FOR PORT ACCESS).   Yes Historical Provider, MD  LORazepam (ATIVAN) 1 MG tablet Take 0.5 mg by mouth 3 (three) times daily.    Yes Historical Provider, MD  magnesium hydroxide (MILK OF MAGNESIA) 400 MG/5ML suspension Take 30 mLs by mouth daily as needed for mild constipation.   Yes Historical Provider, MD  morphine (ROXANOL) 20 MG/ML concentrated solution Take 1 mL (20 mg total) by mouth every 2 (two) hours as needed for moderate pain or severe pain. 03/26/15  Yes [name], MD  Multiple Vitamin (MULTIVITAMIN WITH MINERALS) TABS tablet Take 1 tablet by mouth daily. Patient taking differently: 1 tablet by PEG Tube route daily.  03/08/15  Yes [name], MD  Nutritional Supplements (FEEDING SUPPLEMENT, JEVITY 1.2 CAL,) LIQD Give 1 can Jevity 1.2 via PEG TID between meals with 125 ml free water before and after bolus feeding.  In addition, give 250 cc free water TID with meals. 03/27/15  Yes [name], MD  omeprazole (PRILOSEC) 20 MG capsule 20 mg by PEG Tube route daily.   Yes Historical Provider, MD  ondansetron (ZOFRAN) 8 MG tablet 8 mg by PEG Tube route every 8 (eight) hours as needed for nausea or vomiting.   Yes Historical Provider, MD  pravastatin (PRAVACHOL) 20 MG tablet 20 mg by PEG Tube route daily.   Yes Historical Provider, MD  [place]   Yes Historical Provider, MD  prochlorperazine (COMPAZINE) 10 MG tablet 10 mg by PEG Tube route every 6 (six) hours as needed for nausea or vomiting.   Yes Historical Provider, MD  sodium fluoride (PREVIDENT 5000 PLUS) 1.1 % CREA dental cream Apply thin ribbon of cream to tooth brush. Brush teeth for 2 minutes. Spit out excess-DO NOT swallow. DO NOT rinse afterwards. Repeat nightly. 04/03/15  Yes Lenn Cal, DDS  sodium phosphate (FLEET) enema Place 1 enema rectally once as needed (for constipation). follow package directions   Yes Historical  Provider, MD  sucralfate (CARAFATE) 1 G tablet Dissolve 1 tablet in 21ml H2O and swallow up to QID,PRN sore throat. 04/02/15  Yes [name], MD  thiamine 100 MG tablet Take 1 tablet (100 mg total) by mouth daily. Patient taking differently: 100 mg by PEG Tube route daily.  03/08/15  Yes [name], MD  nicotine (NICODERM CQ - DOSED IN MG/24 HOURS) 21 mg/24hr patch Place 1 patch (21 mg total) onto the skin daily. Patient not taking: Reported on 05/08/2015 03/08/15   [name], MD   BP 142/92 mmHg  Pulse 70  Temp(Src) 99.1 F (37.3 C) (Oral)  Resp 18  SpO2 96% Physical Exam  Constitutional: He is oriented to person, place, and time. He appears well-developed and well-nourished.  HENT:  Head: Normocephalic and atraumatic.  Mouth/Throat: Oropharynx is clear and moist.  Laceration to left frontal forehead, healing well  Eyes: Conjunctivae are normal. Pupils are equal, round, and  reactive to light. Right eye exhibits no discharge. Left eye exhibits no discharge. No scleral icterus.  Neck: Neck supple.  Trach in place. Functioning appropriately  Cardiovascular: Normal rate, regular rhythm and normal heart sounds.   Pulmonary/Chest: Effort normal and breath sounds normal. No respiratory distress. He has no wheezes. He has no rales.  Abdominal: Soft. There is no tenderness.  G-tube present without any evidence of infection. Also appears to be functioning appropriately  Musculoskeletal: He exhibits no tenderness.  Neurological: He is alert and oriented to person, place, and time.  Cranial Nerves II-XII grossly intact. Motor strength is 5/5 in all 4 extremities. Sensation intact to light touch. Gait is baseline.  Skin: Skin is warm and dry. No rash noted.  Psychiatric: He has a normal mood and affect.  Nursing note and vitals reviewed.   ED Course  Procedures (including critical care time) Labs Review Labs Reviewed - No data to display  Imaging Review Ct Head Wo Contrast  05/09/2015    CLINICAL DATA:  Fall, head trauma  EXAM: CT HEAD WITHOUT CONTRAST  TECHNIQUE: Contiguous axial images were obtained from the base of the skull through the vertex without intravenous contrast.  COMPARISON:  09/24/2012  FINDINGS: No acute hemorrhage, infarct, or mass lesion is identified. No midline shift. Ventricles are normal in size. Orbits are unremarkable. No skull fracture. Stable minimal asymmetric prominence of the right lateral ventricle as compared to the left. Evidence of chronic right maxillary sinusitis reidentified.  IMPRESSION: No acute intracranial finding.  Chronic right maxillary sinusitis.   Electronically Signed   By: Conchita Paris M.D.   On: 05/09/2015 20:03   I have personally reviewed and evaluated these images and lab results as part of my medical decision-making.   EKG Interpretation None     Meds given in ED:  Medications  acetaminophen (TYLENOL) tablet 650 mg (650 mg Oral Given 05/09/15 1951)  ondansetron (ZOFRAN-ODT) disintegrating tablet 4 mg (4 mg Oral Given 05/09/15 1952)    Discharge Medication List as of 05/09/2015  9:29 PM     Filed Vitals:   05/09/15 1826 05/09/15 1828 05/09/15 1935  BP: 119/86  142/92  Pulse: 89  70  Temp: 98.5 F (36.9 C)  99.1 F (37.3 C)  TempSrc: Oral  Oral  Resp: 20  18  SpO2: 96% 98% 96%    MDM  Vitals stable - WNL -afebrile Pt resting comfortably in ED. headache improved with Tylenol. PE-normal neuro exam. Repaired laceration appears to be healing well with no evidence of infection or cellulitis. Physical exam is otherwise noncontributory. Imaging-CT scan of head was negative for any acute intracranial abnormalities.  Discussed with patient likely experiencing symptoms related to mild concussion and will need mental rest. May alternate between Tylenol and Motrin at home for discomfort. No evidence of other acute or emergent pathology at this time.  I discussed all relevant lab findings and imaging results with pt and  they verbalized understanding. Discussed f/u with PCP within 48 hrs and return precautions, pt very amenable to plan. Patient stable, in good condition and released out of the ED without difficulty. Final diagnoses:  Nonintractable headache, unspecified chronicity pattern, unspecified headache type       [name], PA-C 05/10/15 [id number]  Evelina Bucy, MD 05/10/15 [id number]

## 2015-05-09 NOTE — Assessment & Plan Note (Signed)
He tolerated treatment very well with objective response to treatment, with resolution of the lymphadenopathy. I will continue aggressive IV fluid support for him

## 2015-05-09 NOTE — Discharge Instructions (Signed)
You were evaluated in the ED today and there does not appear to be an emergent cause your symptoms at this time. Your exam is very reassuring. CT scan of her head did not show any acute problems. Please continue to take Tylenol or Motrin as needed for your discomfort. Follow up with your doctors for reevaluation this week. Return to ED for worsening symptoms.  Headaches, Frequently Asked Questions MIGRAINE HEADACHES Q: What is migraine? What causes it? How can I treat it? A: Generally, migraine headaches begin as a dull ache. Then they develop into a constant, throbbing, and pulsating pain. You may experience pain at the temples. You may experience pain at the front or back of one or both sides of the head. The pain is usually accompanied by a combination of:  Nausea.  Vomiting.  Sensitivity to light and noise. Some people (about 15%) experience an aura (see below) before an attack. The cause of migraine is believed to be chemical reactions in the brain. Treatment for migraine may include over-the-counter or prescription medications. It may also include self-help techniques. These include relaxation training and biofeedback.  Q: What is an aura? A: About 15% of people with migraine get an "aura". This is a sign of neurological symptoms that occur before a migraine headache. You may see wavy or jagged lines, dots, or flashing lights. You might experience tunnel vision or blind spots in one or both eyes. The aura can include visual or auditory hallucinations (something imagined). It may include disruptions in smell (such as strange odors), taste or touch. Other symptoms include:  Numbness.  A "pins and needles" sensation.  Difficulty in recalling or speaking the correct word. These neurological events may last as long as 60 minutes. These symptoms will fade as the headache begins. Q: What is a trigger? A: Certain physical or environmental factors can lead to or "trigger" a migraine. These  include:  Foods.  Hormonal changes.  Weather.  Stress. It is important to remember that triggers are different for everyone. To help prevent migraine attacks, you need to figure out which triggers affect you. Keep a headache diary. This is a good way to track triggers. The diary will help you talk to your healthcare professional about your condition. Q: Does weather affect migraines? A: Bright sunshine, hot, humid conditions, and drastic changes in barometric pressure may lead to, or "trigger," a migraine attack in some people. But studies have shown that weather does not act as a trigger for everyone with migraines. Q: What is the link between migraine and hormones? A: Hormones start and regulate many of your body's functions. Hormones keep your body in balance within a constantly changing environment. The levels of hormones in your body are unbalanced at times. Examples are during menstruation, pregnancy, or menopause. That can lead to a migraine attack. In fact, about three quarters of all women with migraine report that their attacks are related to the menstrual cycle.  Q: Is there an increased risk of stroke for migraine sufferers? A: The likelihood of a migraine attack causing a stroke is very remote. That is not to say that migraine sufferers cannot have a stroke associated with their migraines. In persons under age 32, the most common associated factor for stroke is migraine headache. But over the course of a person's normal life span, the occurrence of migraine headache may actually be associated with a reduced risk of dying from cerebrovascular disease due to stroke.  Q: What are acute medications for migraine?  A: Acute medications are used to treat the pain of the headache after it has started. Examples over-the-counter medications, NSAIDs, ergots, and triptans.  Q: What are the triptans? A: Triptans are the newest class of abortive medications. They are specifically targeted to treat  migraine. Triptans are vasoconstrictors. They moderate some chemical reactions in the brain. The triptans work on receptors in your brain. Triptans help to restore the balance of a neurotransmitter called serotonin. Fluctuations in levels of serotonin are thought to be a main cause of migraine.  Q: Are over-the-counter medications for migraine effective? A: Over-the-counter, or "OTC," medications may be effective in relieving mild to moderate pain and associated symptoms of migraine. But you should see your caregiver before beginning any treatment regimen for migraine.  Q: What are preventive medications for migraine? A: Preventive medications for migraine are sometimes referred to as "prophylactic" treatments. They are used to reduce the frequency, severity, and length of migraine attacks. Examples of preventive medications include antiepileptic medications, antidepressants, beta-blockers, calcium channel blockers, and NSAIDs (nonsteroidal anti-inflammatory drugs). Q: Why are anticonvulsants used to treat migraine? A: During the past few years, there has been an increased interest in antiepileptic drugs for the prevention of migraine. They are sometimes referred to as "anticonvulsants". Both epilepsy and migraine may be caused by similar reactions in the brain.  Q: Why are antidepressants used to treat migraine? A: Antidepressants are typically used to treat people with depression. They may reduce migraine frequency by regulating chemical levels, such as serotonin, in the brain.  Q: What alternative therapies are used to treat migraine? A: The term "alternative therapies" is often used to describe treatments considered outside the scope of conventional Western medicine. Examples of alternative therapy include acupuncture, acupressure, and yoga. Another common alternative treatment is herbal therapy. Some herbs are believed to relieve headache pain. Always discuss alternative therapies with your caregiver  before proceeding. Some herbal products contain arsenic and other toxins. TENSION HEADACHES Q: What is a tension-type headache? What causes it? How can I treat it? A: Tension-type headaches occur randomly. They are often the result of temporary stress, anxiety, fatigue, or anger. Symptoms include soreness in your temples, a tightening band-like sensation around your head (a "vice-like" ache). Symptoms can also include a pulling feeling, pressure sensations, and contracting head and neck muscles. The headache begins in your forehead, temples, or the back of your head and neck. Treatment for tension-type headache may include over-the-counter or prescription medications. Treatment may also include self-help techniques such as relaxation training and biofeedback. CLUSTER HEADACHES Q: What is a cluster headache? What causes it? How can I treat it? A: Cluster headache gets its name because the attacks come in groups. The pain arrives with little, if any, warning. It is usually on one side of the head. A tearing or bloodshot eye and a runny nose on the same side of the headache may also accompany the pain. Cluster headaches are believed to be caused by chemical reactions in the brain. They have been described as the most severe and intense of any headache type. Treatment for cluster headache includes prescription medication and oxygen. SINUS HEADACHES Q: What is a sinus headache? What causes it? How can I treat it? A: When a cavity in the bones of the face and skull (a sinus) becomes inflamed, the inflammation will cause localized pain. This condition is usually the result of an allergic reaction, a tumor, or an infection. If your headache is caused by a sinus blockage, such as  an infection, you will probably have a fever. An x-ray will confirm a sinus blockage. Your caregiver's treatment might include antibiotics for the infection, as well as antihistamines or decongestants.  REBOUND HEADACHES Q: What is a  rebound headache? What causes it? How can I treat it? A: A pattern of taking acute headache medications too often can lead to a condition known as "rebound headache." A pattern of taking too much headache medication includes taking it more than 2 days per week or in excessive amounts. That means more than the label or a caregiver advises. With rebound headaches, your medications not only stop relieving pain, they actually begin to cause headaches. Doctors treat rebound headache by tapering the medication that is being overused. Sometimes your caregiver will gradually substitute a different type of treatment or medication. Stopping may be a challenge. Regularly overusing a medication increases the potential for serious side effects. Consult a caregiver if you regularly use headache medications more than 2 days per week or more than the label advises. ADDITIONAL QUESTIONS AND ANSWERS Q: What is biofeedback? A: Biofeedback is a self-help treatment. Biofeedback uses special equipment to monitor your body's involuntary physical responses. Biofeedback monitors:  Breathing.  Pulse.  Heart rate.  Temperature.  Muscle tension.  Brain activity. Biofeedback helps you refine and perfect your relaxation exercises. You learn to control the physical responses that are related to stress. Once the technique has been mastered, you do not need the equipment any more. Q: Are headaches hereditary? A: Four out of five (80%) of people that suffer report a family history of migraine. Scientists are not sure if this is genetic or a family predisposition. Despite the uncertainty, a child has a 50% chance of having migraine if one parent suffers. The child has a 75% chance if both parents suffer.  Q: Can children get headaches? A: By the time they reach high school, most young people have experienced some type of headache. Many safe and effective approaches or medications can prevent a headache from occurring or stop it  after it has begun.  Q: What type of doctor should I see to diagnose and treat my headache? A: Start with your primary caregiver. Discuss his or her experience and approach to headaches. Discuss methods of classification, diagnosis, and treatment. Your caregiver may decide to recommend you to a headache specialist, depending upon your symptoms or other physical conditions. Having diabetes, allergies, etc., may require a more comprehensive and inclusive approach to your headache. The [employer] will provide, upon request, a list of [facility] physician members in your state. Document Released: 10/25/2003 Document Revised: 10/27/2011 Document Reviewed: 04/03/2008 [facility] Patient Information 2015 [place], [place]. This information is not intended to replace advice given to you by your health care provider. Make sure you discuss any questions you have with your health care provider.

## 2015-05-10 ENCOUNTER — Encounter: Payer: Self-pay | Admitting: Hematology and Oncology

## 2015-05-10 ENCOUNTER — Ambulatory Visit (HOSPITAL_BASED_OUTPATIENT_CLINIC_OR_DEPARTMENT_OTHER): Payer: Medicaid Other | Admitting: Hematology and Oncology

## 2015-05-10 ENCOUNTER — Other Ambulatory Visit: Payer: Self-pay | Admitting: Hematology and Oncology

## 2015-05-10 ENCOUNTER — Ambulatory Visit (HOSPITAL_BASED_OUTPATIENT_CLINIC_OR_DEPARTMENT_OTHER): Payer: Medicaid Other

## 2015-05-10 ENCOUNTER — Ambulatory Visit (HOSPITAL_COMMUNITY)
Admission: RE | Admit: 2015-05-10 | Discharge: 2015-05-10 | Disposition: A | Discharge status: Home / Self Care | Payer: Medicaid Other | Source: Ambulatory Visit | Attending: Hematology and Oncology | Admitting: Hematology and Oncology

## 2015-05-10 ENCOUNTER — Telehealth: Payer: Self-pay | Admitting: *Deleted

## 2015-05-10 ENCOUNTER — Other Ambulatory Visit: Payer: Self-pay | Admitting: *Deleted

## 2015-05-10 ENCOUNTER — Ambulatory Visit: Payer: Self-pay | Admitting: Hematology and Oncology

## 2015-05-10 VITALS — BP 107/77 | HR 82 | Temp 98.9°F | Resp 22

## 2015-05-10 DIAGNOSIS — C321 Malignant neoplasm of supraglottis: Secondary | ICD-10-CM | POA: Insufficient documentation

## 2015-05-10 DIAGNOSIS — D6959 Other secondary thrombocytopenia: Secondary | ICD-10-CM

## 2015-05-10 DIAGNOSIS — G44011 Episodic cluster headache, intractable: Secondary | ICD-10-CM

## 2015-05-10 DIAGNOSIS — E876 Hypokalemia: Secondary | ICD-10-CM

## 2015-05-10 DIAGNOSIS — D6481 Anemia due to antineoplastic chemotherapy: Secondary | ICD-10-CM | POA: Diagnosis present

## 2015-05-10 DIAGNOSIS — D638 Anemia in other chronic diseases classified elsewhere: Secondary | ICD-10-CM | POA: Diagnosis present

## 2015-05-10 DIAGNOSIS — E43 Unspecified severe protein-calorie malnutrition: Secondary | ICD-10-CM

## 2015-05-10 DIAGNOSIS — T50905A Adverse effect of unspecified drugs, medicaments and biological substances, initial encounter: Secondary | ICD-10-CM

## 2015-05-10 DIAGNOSIS — T451X5A Adverse effect of antineoplastic and immunosuppressive drugs, initial encounter: Principal | ICD-10-CM

## 2015-05-10 DIAGNOSIS — R519 Headache, unspecified: Secondary | ICD-10-CM

## 2015-05-10 HISTORY — DX: Hypokalemia: E87.6

## 2015-05-10 HISTORY — DX: Headache, unspecified: R51.9

## 2015-05-10 LAB — COMPREHENSIVE METABOLIC PANEL (CC13)
ALBUMIN: 2.7 g/dL — AB (ref 3.5–5.0)
ALK PHOS: 85 U/L (ref 40–150)
ALT: 12 U/L (ref 0–55)
AST: 20 U/L (ref 5–34)
Anion Gap: 7 mEq/L (ref 3–11)
BUN: 13.4 mg/dL (ref 7.0–26.0)
CO2: 32 mEq/L — ABNORMAL HIGH (ref 22–29)
Chloride: 101 mEq/L (ref 98–109)
Creatinine: 0.7 mg/dL (ref 0.7–1.3)
Glucose: 96 mg/dl (ref 70–140)
Sodium: 140 mEq/L (ref 136–145)
Total Bilirubin: 0.51 mg/dL (ref 0.20–1.20)
Total Protein: 5.8 g/dL — ABNORMAL LOW (ref 6.4–8.3)

## 2015-05-10 LAB — CBC WITH DIFFERENTIAL/PLATELET
BASO%: 0.3 % (ref 0.0–2.0)
Basophils Absolute: 0 10*3/uL (ref 0.0–0.1)
EOS%: 1.1 % (ref 0.0–7.0)
Eosinophils Absolute: 0 10*3/uL (ref 0.0–0.5)
HEMATOCRIT: 23.3 % — AB (ref 38.4–49.9)
HGB: 7.9 g/dL — ABNORMAL LOW (ref 13.0–17.1)
LYMPH#: 0.2 10*3/uL — AB (ref 0.9–3.3)
LYMPH%: 5 % — AB (ref 14.0–49.0)
MCH: 30.8 pg (ref 27.2–33.4)
MCHC: 34.1 g/dL (ref 32.0–36.0)
MCV: 90.5 fL (ref 79.3–98.0)
MONO#: 0.3 10*3/uL (ref 0.1–0.9)
MONO%: 8 % (ref 0.0–14.0)
NEUT%: 85.6 % — ABNORMAL HIGH (ref 39.0–75.0)
NEUTROS ABS: 3.2 10*3/uL (ref 1.5–6.5)
PLATELETS: 126 10*3/uL — AB (ref 140–400)
RBC: 2.58 10*6/uL — AB (ref 4.20–5.82)
RDW: 13.9 % (ref 11.0–14.6)
WBC: 3.7 10*3/uL — AB (ref 4.0–10.3)

## 2015-05-10 LAB — PREPARE RBC (CROSSMATCH)

## 2015-05-10 LAB — ABO/RH: ABO/RH(D): O POS

## 2015-05-10 LAB — HOLD TUBE, BLOOD BANK

## 2015-05-10 MED ORDER — HYDROMORPHONE HCL 1 MG/ML IJ SOLN
2.0000 mg | Freq: Once | INTRAMUSCULAR | Status: AC
  Administered 2015-05-10: 2 mg via INTRAVENOUS
  Filled 2015-05-10: qty 2

## 2015-05-10 MED ORDER — SODIUM CHLORIDE 0.9 % IV SOLN
INTRAVENOUS | Status: DC
  Filled 2015-05-10: qty 500

## 2015-05-10 MED ORDER — HYDROMORPHONE HCL 4 MG/ML IJ SOLN
INTRAMUSCULAR | Status: AC
  Filled 2015-05-10: qty 1

## 2015-05-10 MED ORDER — SODIUM CHLORIDE 0.9 % IJ SOLN
10.0000 mL | INTRAMUSCULAR | Status: DC | PRN
  Filled 2015-05-10: qty 10

## 2015-05-10 MED ORDER — POTASSIUM CHLORIDE 10 MEQ/100ML IV SOLN
10.0000 meq | INTRAVENOUS | Status: DC
Start: 2015-05-10 — End: 2015-05-10

## 2015-05-10 MED ORDER — BUTALBITAL-APAP-CAFFEINE 50-325-40 MG PO TABS: 1.0000 | ORAL_TABLET | ORAL | Status: DC | PRN

## 2015-05-10 MED ORDER — SODIUM CHLORIDE 0.9 % IV SOLN
Freq: Once | INTRAVENOUS | Status: AC
  Administered 2015-05-10: 11:00:00 via INTRAVENOUS
  Filled 2015-05-10: qty 4

## 2015-05-10 MED ORDER — POTASSIUM CHLORIDE 2 MEQ/ML IV SOLN
INTRAVENOUS | Status: AC
Start: 2015-05-10 — End: 2015-05-10
  Administered 2015-05-10: 14:00:00 via INTRAVENOUS
  Filled 2015-05-10: qty 500

## 2015-05-10 MED ORDER — HYDROMORPHONE HCL 1 MG/ML IJ SOLN
2.0000 mg | INTRAMUSCULAR | Status: DC | PRN
  Administered 2015-05-10: 2 mg via INTRAVENOUS
  Filled 2015-05-10: qty 2

## 2015-05-10 MED ORDER — SODIUM CHLORIDE 0.9 % IV SOLN
Freq: Once | INTRAVENOUS | Status: AC
  Administered 2015-05-10: 11:00:00 via INTRAVENOUS

## 2015-05-10 MED ORDER — HEPARIN SOD (PORK) LOCK FLUSH 100 UNIT/ML IV SOLN
500.0000 [IU] | Freq: Once | INTRAVENOUS | Status: AC | PRN
  Filled 2015-05-10: qty 5

## 2015-05-10 MED ORDER — POTASSIUM CHLORIDE 10 MEQ/100ML IV SOLN: 10.0000 meq | INTRAVENOUS | Status: DC

## 2015-05-10 NOTE — Patient Instructions (Signed)
Dehydration, Adult Dehydration is when you lose more fluids from the body than you take in. Vital organs like the kidneys, brain, and heart cannot function without a proper amount of fluids and salt. Any loss of fluids from the body can cause dehydration.  CAUSES   Vomiting.  Diarrhea.  Excessive sweating.  Excessive urine output.  Fever. SYMPTOMS  Mild dehydration  Thirst.  Dry lips.  Slightly dry mouth. Moderate dehydration  Very dry mouth.  Sunken eyes.  Skin does not bounce back quickly when lightly pinched and released.  Dark urine and decreased urine production.  Decreased tear production.  Headache. Severe dehydration  Very dry mouth.  Extreme thirst.  Rapid, weak pulse (more than 100 beats per minute at rest).  Cold hands and feet.  Not able to sweat in spite of heat and temperature.  Rapid breathing.  Blue lips.  Confusion and lethargy.  Difficulty being awakened.  Minimal urine production.  No tears. DIAGNOSIS  Your caregiver will diagnose dehydration based on your symptoms and your exam. Blood and urine tests will help confirm the diagnosis. The diagnostic evaluation should also identify the cause of dehydration. TREATMENT  Treatment of mild or moderate dehydration can often be done at home by increasing the amount of fluids that you drink. It is best to drink small amounts of fluid more often. Drinking too much at one time can make vomiting worse. Refer to the home care instructions below. Severe dehydration needs to be treated at the hospital where you will probably be given intravenous (IV) fluids that contain water and electrolytes. HOME CARE INSTRUCTIONS   Ask your caregiver about specific rehydration instructions.  Drink enough fluids to keep your urine clear or pale yellow.  Drink small amounts frequently if you have nausea and vomiting.  Eat as you normally do.  Avoid:  Foods or drinks high in sugar.  Carbonated  drinks.  Juice.  Extremely hot or cold fluids.  Drinks with caffeine.  Fatty, greasy foods.  Alcohol.  Tobacco.  Overeating.  Gelatin desserts.  Wash your hands well to avoid spreading bacteria and viruses.  Only take over-the-counter or prescription medicines for pain, discomfort, or fever as directed by your caregiver.  Ask your caregiver if you should continue all prescribed and over-the-counter medicines.  Keep all follow-up appointments with your caregiver. SEEK MEDICAL CARE IF:  You have abdominal pain and it increases or stays in one area (localizes).  You have a rash, stiff neck, or severe headache.  You are irritable, sleepy, or difficult to awaken.  You are weak, dizzy, or extremely thirsty. SEEK IMMEDIATE MEDICAL CARE IF:   You are unable to keep fluids down or you get worse despite treatment.  You have frequent episodes of vomiting or diarrhea.  You have blood or green matter (bile) in your vomit.  You have blood in your stool or your stool looks black and tarry.  You have not urinated in 6 to 8 hours, or you have only urinated a small amount of very dark urine.  You have a fever.  You faint. MAKE SURE YOU:   Understand these instructions.  Will watch your condition.  Will get help right away if you are not doing well or get worse. Document Released: 08/04/2005 Document Revised: 10/27/2011 Document Reviewed: 03/24/2011 ExitCare Patient Information 2015 [employer]. This information is not intended to replace advice given to you by your health care provider. Make sure you discuss any questions you have with your health care   provider.  

## 2015-05-10 NOTE — Telephone Encounter (Signed)
Voicemail from pt's nurse Princess at [place] [phone]) calling on patient's behalf.  He wants to speak with Dr. Alvy Bimler or her nurse.  He doesn't feel well and says he needs to be hospitalized.  Please call." At 1025 observed he is arrived to Treatment room section D.  Will notify MD.

## 2015-05-10 NOTE — Telephone Encounter (Signed)
ok 

## 2015-05-11 ENCOUNTER — Encounter: Payer: Self-pay | Admitting: Hematology and Oncology

## 2015-05-11 ENCOUNTER — Ambulatory Visit (HOSPITAL_BASED_OUTPATIENT_CLINIC_OR_DEPARTMENT_OTHER): Payer: Medicaid Other

## 2015-05-11 VITALS — BP 114/73 | HR 69 | Temp 98.2°F | Resp 18

## 2015-05-11 DIAGNOSIS — C321 Malignant neoplasm of supraglottis: Secondary | ICD-10-CM

## 2015-05-11 DIAGNOSIS — D638 Anemia in other chronic diseases classified elsewhere: Secondary | ICD-10-CM | POA: Diagnosis not present

## 2015-05-11 DIAGNOSIS — E43 Unspecified severe protein-calorie malnutrition: Secondary | ICD-10-CM

## 2015-05-11 DIAGNOSIS — D6481 Anemia due to antineoplastic chemotherapy: Secondary | ICD-10-CM

## 2015-05-11 HISTORY — DX: Adverse effect of antineoplastic and immunosuppressive drugs, initial encounter: D64.81

## 2015-05-11 LAB — CORTISOL: CORTISOL PLASMA: 10.1 ug/dL

## 2015-05-11 MED ORDER — HEPARIN SOD (PORK) LOCK FLUSH 100 UNIT/ML IV SOLN
500.0000 [IU] | Freq: Every day | INTRAVENOUS | Status: AC | PRN
  Administered 2015-05-11: 500 [IU]
  Filled 2015-05-11: qty 5

## 2015-05-11 MED ORDER — SODIUM CHLORIDE 0.9 % IV SOLN
250.0000 mL | Freq: Once | INTRAVENOUS | Status: AC
  Administered 2015-05-11: 250 mL via INTRAVENOUS

## 2015-05-11 MED ORDER — HYDROMORPHONE HCL 4 MG/ML IJ SOLN
2.0000 mg | INTRAMUSCULAR | Status: DC | PRN
Start: 2015-05-11 — End: 2015-05-11

## 2015-05-11 MED ORDER — HYDROMORPHONE HCL 4 MG/ML IJ SOLN
2.0000 mg | INTRAMUSCULAR | Status: DC | PRN
  Administered 2015-05-11: 2 mg via INTRAVENOUS

## 2015-05-11 MED ORDER — SODIUM CHLORIDE 0.9 % IJ SOLN
10.0000 mL | INTRAMUSCULAR | Status: AC | PRN
  Administered 2015-05-11: 10 mL
  Filled 2015-05-11: qty 10

## 2015-05-11 MED ORDER — HYDROMORPHONE HCL 4 MG/ML IJ SOLN
INTRAMUSCULAR | Status: AC
  Filled 2015-05-11: qty 1

## 2015-05-11 NOTE — Assessment & Plan Note (Signed)
The most likely cause of the hypokalemia is related to recent chemotherapy. I will order IV replacement as he has unreliable oral intake. I plan to recheck it next week. We will add potassium to IV fluids when he come in for hydration

## 2015-05-11 NOTE — Assessment & Plan Note (Signed)
He tolerated treatment very well with objective response to treatment, with resolution of the lymphadenopathy. I will continue aggressive IV fluid support for him

## 2015-05-11 NOTE — Assessment & Plan Note (Signed)
This is most likely related to recent chemotherapy and has caused the weakness and recurrent falls/dizziness We discussed some of the risks, benefits, and alternatives of blood transfusions. The patient is symptomatic from anemia and the hemoglobin level is critically low.  Some of the side-effects to be expected including risks of transfusion reactions, chills, infection, syndrome of volume overload and risk of hospitalization from various reasons and the patient is willing to proceed and went ahead to sign consent today. I plan to give him 2 units of blood.

## 2015-05-11 NOTE — Assessment & Plan Note (Signed)
He had recurrent falls and had recent severe headaches. CT scan done in the ER was negative for bleed. He requested admission. I spoke with a Hospitalist and the physician felt that admission is not warranted despite recurrent falls, hypotension and failure to thrive Repeat blood work including cortisol, CBC and CMET revealed anemia & hypokalemia as potential cause of his symptoms and they will be managed as out-patient. I have given the patient Fioricet to try for headaches.

## 2015-05-11 NOTE — Patient Instructions (Signed)

## 2015-05-11 NOTE — Progress Notes (Signed)
[facility] OFFICE PROGRESS NOTE  Patient Care Team: No Pcp Per Patient as PCP - General (General Practice) [name], MD as Consulting Physician (Otolaryngology) [name], MD as Attending Physician (Radiation Oncology) [name], MD as Consulting Physician (Hematology and Oncology) Leota Sauers, RN as Oncology Nurse Navigator  SUMMARY OF ONCOLOGIC HISTORY: Oncology History   Carcinoma of supraglottis   Staging form: Larynx - Supraglottis, AJCC 7th Edition     Clinical stage from 03/13/2015: Stage IVA (T3, N2b, M0) - Signed by [name], MD on 03/13/2015       Carcinoma of supraglottis   01/22/2015 Imaging CT scan showed enhancing infiltrative mass centered at the right piriform sinus with bilateral neck lymphadenopathy   02/22/2015 - 03/08/2015 Hospital Admission He was admitted to the hospital for management of advanced supraglottic cancer status post tracheostomy, biopsy, dental extraction, placement of feeding tube and subsequent discharge to skilled nursing facility   02/22/2015 Pathology Results Accession: [id number] 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   03/09/2015 PET scan 1)  Right piriform sinus mass with right level 2 nodal metastasis.  2)  Hypermetabolic thoracic nodes which are at least partially felt to be reactive.  3)  No evidence of subdiaphragmatic metastatic disease.    03/16/2015 Procedure He has placement of port   03/21/2015 - 05/04/2015 Chemotherapy He received high dose cisplatin x 3 cycles   03/21/2015 - 05/09/2015 Radiation Therapy He received radiation treatment with chemotherapy    INTERVAL HISTORY: Please see below for problem oriented charting. He was seen urgently because of constellation of symptoms of dizziness, recurrent falls, new onset migraine headaches and weakness. The patient requested admission because he  feared he may suffered from concussion from falls. He denies new neurological weakness. No new signs or symptoms to suggest recent infection. He had another dizzy spell and hit his head, went to the ER and underwent CT scan but was discharged REVIEW OF SYSTEMS:   Constitutional: Denies fevers, chills or abnormal weight loss Eyes: Denies blurriness of vision Respiratory: Denies cough, dyspnea or wheezes Cardiovascular: Denies palpitation, chest discomfort or lower extremity swelling Gastrointestinal:  Denies nausea, heartburn or change in bowel habits Skin: Denies abnormal skin rashes Lymphatics: Denies new lymphadenopathy or easy bruising Behavioral/Psych: Mood is stable, no new changes  All other systems were reviewed with the patient and are negative.  I have reviewed the past medical history, past surgical history, social history and family history with the patient and they are unchanged from previous note.  ALLERGIES:  has No Known Allergies.  MEDICATIONS:  Current Outpatient Prescriptions  Medication Sig Dispense Refill  . bisacodyl (DULCOLAX) 10 MG suppository Place 10 mg rectally as needed for moderate constipation.    . butalbital-acetaminophen-caffeine (FIORICET) 50-325-40 MG per tablet Take 1 tablet by mouth every 4 (four) hours as needed for headache. 60 tablet 0  . citalopram (CELEXA) 20 MG tablet 20 mg by PEG Tube route daily.    . feeding supplement, ENSURE, (ENSURE) PUDG Take 1 Container by mouth 3 (three) times daily between meals.    . fentaNYL (DURAGESIC - DOSED MCG/HR) 50 MCG/HR Place 1 patch (50 mcg total) onto the skin every 3 (three) days. 5 patch 0  . folic acid (FOLVITE) 1 MG tablet Take 1 tablet (1 mg total) by mouth daily. (Patient taking differently: 1 mg by PEG Tube route daily. ) 30 tablet  0  . HYDROcodone-acetaminophen (HYCET) 7.5-325 mg/15 ml solution Take 15 mLs by mouth 4 (four) times daily as needed for moderate pain. 120 mL 0  . ibuprofen (ADVIL,MOTRIN)  100 MG/5ML suspension Take 200-400 mg by mouth every 4 (four) hours as needed for mild pain.     Marland Kitchen lidocaine (XYLOCAINE) 2 % solution Mix 1 part 2%viscous lidocaine,1part H2O.Swish and/or swallow 39ml of this mixture,64min before meals and at bedtime, up to QID (Patient taking differently: Mix 1 part 2%viscous lidocaine,1part H2O.Swish and/or swallow 11ml of this mixture,63min before meals and at bedtime, up to QID as needed for sore throat) 100 mL 5  . lidocaine-prilocaine (EMLA) cream Apply 1 application topically as needed (FOR PORT ACCESS).    . LORazepam (ATIVAN) 1 MG tablet Take 0.5 mg by mouth 3 (three) times daily.     . magnesium hydroxide (MILK OF MAGNESIA) 400 MG/5ML suspension Take 30 mLs by mouth daily as needed for mild constipation.    Marland Kitchen morphine (ROXANOL) 20 MG/ML concentrated solution Take 1 mL (20 mg total) by mouth every 2 (two) hours as needed for moderate pain or severe pain. 240 mL 0  . Multiple Vitamin (MULTIVITAMIN WITH MINERALS) TABS tablet Take 1 tablet by mouth daily. (Patient taking differently: 1 tablet by PEG Tube route daily. ) 30 tablet 0  . nicotine (NICODERM CQ - DOSED IN MG/24 HOURS) 21 mg/24hr patch Place 1 patch (21 mg total) onto the skin daily. (Patient not taking: Reported on 05/08/2015) 28 patch 0  . Nutritional Supplements (FEEDING SUPPLEMENT, JEVITY 1.2 CAL,) LIQD Give 1 can Jevity 1.2 via PEG TID between meals with 125 ml free water before and after bolus feeding.  In addition, give 250 cc free water TID with meals. 711 mL 0  . omeprazole (PRILOSEC) 20 MG capsule 20 mg by PEG Tube route daily.    . ondansetron (ZOFRAN) 8 MG tablet 8 mg by PEG Tube route every 8 (eight) hours as needed for nausea or vomiting.    . pravastatin (PRAVACHOL) 20 MG tablet 20 mg by PEG Tube route daily.    Marland Kitchen PRESCRIPTION MEDICATION CHEMO [facility]    . prochlorperazine (COMPAZINE) 10 MG tablet 10 mg by PEG Tube route every 6 (six) hours as needed for nausea or vomiting.    . sodium  fluoride (PREVIDENT 5000 PLUS) 1.1 % CREA dental cream Apply thin ribbon of cream to tooth brush. Brush teeth for 2 minutes. Spit out excess-DO NOT swallow. DO NOT rinse afterwards. Repeat nightly. 1 Tube prn  . sodium phosphate (FLEET) enema Place 1 enema rectally once as needed (for constipation). follow package directions    . sucralfate (CARAFATE) 1 G tablet Dissolve 1 tablet in 22ml H2O and swallow up to QID,PRN sore throat. 60 tablet 5  . thiamine 100 MG tablet Take 1 tablet (100 mg total) by mouth daily. (Patient taking differently: 100 mg by PEG Tube route daily. ) 30 tablet 0   No current facility-administered medications for this visit.   Facility-Administered Medications Ordered in Other Visits  Medication Dose Route Frequency Provider Last Rate Last Dose  . HYDROmorphone (DILAUDID) injection 2 mg  2 mg Intravenous Q2H PRN [name], MD   2 mg at 05/10/15 1043  . HYDROmorphone (DILAUDID) injection 2 mg  2 mg Intravenous Q2H PRN [name], MD      . HYDROmorphone (DILAUDID) injection 2 mg  2 mg Intravenous Q2H PRN [name], MD   2 mg at 05/11/15 0920  .  ondansetron (ZOFRAN) 8 mg in sodium chloride 0.9 % 50 mL IVPB   Intravenous Once [name], MD      . ondansetron (ZOFRAN) 8 mg in sodium chloride 0.9 % 50 mL IVPB   Intravenous Once [name], MD      . sodium chloride 0.9 % injection 10 mL  10 mL Intracatheter PRN [name], MD        PHYSICAL EXAMINATION: ECOG PERFORMANCE STATUS: 2 - Symptomatic, <50% confined to bed GENERAL:alert, no distress and comfortable SKIN: skin color, texture, turgor are normal, no rashes or significant lesions. Noted mild facial swelling over his left forehead EYES: normal, Conjunctiva are pink and non-injected, sclera clear OROPHARYNX:no exudate, no erythema and lips, buccal mucosa, and tongue normal  NECK: supple, thyroid normal size, non-tender, without nodularity LYMPH:  no palpable lymphadenopathy in the cervical, axillary or inguinal LUNGS:  clear to auscultation and percussion with normal breathing effort HEART: regular rate & rhythm and no murmurs and no lower extremity edema ABDOMEN:abdomen soft, non-tender and normal bowel sounds Musculoskeletal:no cyanosis of digits and no clubbing  NEURO: alert & oriented x 3 with fluent speech, no focal motor/sensory deficits  LABORATORY DATA:  I have reviewed the data as listed    Component Value Date/Time   NA 140 05/10/2015 1144   NA 135 02/14/2015 1439   K 2.8 Repeated and Verified* 05/10/2015 1144   K 4.2 02/14/2015 1439   CL 100* 02/14/2015 1439   CO2 32* 05/10/2015 1144   CO2 24 02/14/2015 1439   GLUCOSE 96 05/10/2015 1144   GLUCOSE 81 02/14/2015 1439   BUN 13.4 05/10/2015 1144   BUN <5* 02/14/2015 1439   CREATININE 0.7 05/10/2015 1144   CREATININE 0.65 02/22/2015 1731   CALCIUM 7.9 Repeated and Verified* 05/10/2015 1144   CALCIUM 8.5* 02/14/2015 1439   PROT 5.8* 05/10/2015 1144   PROT 7.7 02/14/2015 1439   ALBUMIN 2.7* 05/10/2015 1144   ALBUMIN 3.3* 02/14/2015 1439   AST 20 05/10/2015 1144   AST 56* 02/14/2015 1439   ALT 12 05/10/2015 1144   ALT 27 02/14/2015 1439   ALKPHOS 85 05/10/2015 1144   ALKPHOS 113 02/14/2015 1439   BILITOT 0.51 05/10/2015 1144   BILITOT 0.4 02/14/2015 1439   GFRNONAA >60 02/22/2015 1731   GFRAA >60 02/22/2015 1731    No results found for: SPEP, UPEP  Lab Results  Component Value Date   WBC 3.7* 05/10/2015   NEUTROABS 3.2 05/10/2015   HGB 7.9* 05/10/2015   HCT 23.3* 05/10/2015   MCV 90.5 05/10/2015   PLT 126* 05/10/2015      Chemistry      Component Value Date/Time   NA 140 05/10/2015 1144   NA 135 02/14/2015 1439   K 2.8 Repeated and Verified* 05/10/2015 1144   K 4.2 02/14/2015 1439   CL 100* 02/14/2015 1439   CO2 32* 05/10/2015 1144   CO2 24 02/14/2015 1439   BUN 13.4 05/10/2015 1144   BUN <5* 02/14/2015 1439   CREATININE 0.7 05/10/2015 1144   CREATININE 0.65 02/22/2015 1731      Component Value Date/Time    CALCIUM 7.9 Repeated and Verified* 05/10/2015 1144   CALCIUM 8.5* 02/14/2015 1439   ALKPHOS 85 05/10/2015 1144   ALKPHOS 113 02/14/2015 1439   AST 20 05/10/2015 1144   AST 56* 02/14/2015 1439   ALT 12 05/10/2015 1144   ALT 27 02/14/2015 1439   BILITOT 0.51 05/10/2015 1144   BILITOT 0.4 02/14/2015 1439  RADIOGRAPHIC STUDIES: I have personally reviewed the radiological images as listed and agreed with the findings in the report. Ct Head Wo Contrast  05/09/2015   CLINICAL DATA:  Fall, head trauma  EXAM: CT HEAD WITHOUT CONTRAST  TECHNIQUE: Contiguous axial images were obtained from the base of the skull through the vertex without intravenous contrast.  COMPARISON:  09/24/2012  FINDINGS: No acute hemorrhage, infarct, or mass lesion is identified. No midline shift. Ventricles are normal in size. Orbits are unremarkable. No skull fracture. Stable minimal asymmetric prominence of the right lateral ventricle as compared to the left. Evidence of chronic right maxillary sinusitis reidentified.  IMPRESSION: No acute intracranial finding.  Chronic right maxillary sinusitis.   Electronically Signed   By: Conchita Paris M.D.   On: 05/09/2015 20:03     ASSESSMENT & PLAN:  Anemia due to chemotherapy This is most likely related to recent chemotherapy and has caused the weakness and recurrent falls/dizziness We discussed some of the risks, benefits, and alternatives of blood transfusions. The patient is symptomatic from anemia and the hemoglobin level is critically low.  Some of the side-effects to be expected including risks of transfusion reactions, chills, infection, syndrome of volume overload and risk of hospitalization from various reasons and the patient is willing to proceed and went ahead to sign consent today. I plan to give him 2 units of blood.  Hypokalemia due to loss of potassium The most likely cause of the hypokalemia is related to recent chemotherapy. I will order IV replacement as  he has unreliable oral intake. I plan to recheck it next week. We will add potassium to IV fluids when he come in for hydration  Thrombocytopenia due to drugs This is likely due to recent treatment. The patient denies recent history of bleeding such as epistaxis, hematuria or hematochezia. He is asymptomatic from the low platelet count. I will observe for now.   Carcinoma of supraglottis He tolerated treatment very well with objective response to treatment, with resolution of the lymphadenopathy. I will continue aggressive IV fluid support for him     Headache He had recurrent falls and had recent severe headaches. CT scan done in the ER was negative for bleed. He requested admission. I spoke with a Hospitalist and the physician felt that admission is not warranted despite recurrent falls, hypotension and failure to thrive Repeat blood work including cortisol, CBC and CMET revealed anemia & hypokalemia as potential cause of his symptoms and they will be managed as out-patient. I have given the patient Fioricet to try for headaches.   No orders of the defined types were placed in this encounter.   All questions were answered. The patient knows to call the clinic with any problems, questions or concerns. No barriers to learning was detected. I spent 30 minutes counseling the patient face to face. The total time spent in the appointment was 40 minutes and more than 50% was on counseling and review of test results     [facility], [place], MD 05/11/2015 2:08 PM

## 2015-05-11 NOTE — Assessment & Plan Note (Signed)
This is likely due to recent treatment. The patient denies recent history of bleeding such as epistaxis, hematuria or hematochezia. He is asymptomatic from the low platelet count. I will observe for now.  

## 2015-05-11 NOTE — Progress Notes (Signed)
Patient to receive 2 units PRBC's only today. Patient is to receive fluids tomorrow per Foye Spurling, RN with Dr. Alvy Bimler.   Patient complains of posterior chronic throat pain rating the pain a 5 on the 0 to 10 pain scale. Patient describes the pain as burning. Patient given 2 mg hydromorphone per Dr. Alvy Bimler orders in supportive therapy.

## 2015-05-12 ENCOUNTER — Ambulatory Visit (HOSPITAL_BASED_OUTPATIENT_CLINIC_OR_DEPARTMENT_OTHER): Payer: Medicaid Other

## 2015-05-12 DIAGNOSIS — C321 Malignant neoplasm of supraglottis: Secondary | ICD-10-CM

## 2015-05-12 DIAGNOSIS — E43 Unspecified severe protein-calorie malnutrition: Secondary | ICD-10-CM

## 2015-05-12 MED ORDER — IBUPROFEN 200 MG PO TABS
400.0000 mg | ORAL_TABLET | Freq: Once | ORAL | Status: AC
  Administered 2015-05-12: 400 mg via ORAL

## 2015-05-12 MED ORDER — SODIUM CHLORIDE 0.9 % IV SOLN
Freq: Once | INTRAVENOUS | Status: AC
  Administered 2015-05-12: 09:00:00 via INTRAVENOUS

## 2015-05-12 MED ORDER — HYDROMORPHONE HCL 4 MG/ML IJ SOLN
INTRAMUSCULAR | Status: AC
  Filled 2015-05-12: qty 1

## 2015-05-12 MED ORDER — SODIUM CHLORIDE 0.9 % IJ SOLN
10.0000 mL | INTRAMUSCULAR | Status: DC | PRN
  Administered 2015-05-12: 10 mL
  Filled 2015-05-12: qty 10

## 2015-05-12 MED ORDER — SODIUM CHLORIDE 0.9 % IV SOLN
INTRAVENOUS | Status: DC
  Administered 2015-05-12: 09:00:00 via INTRAVENOUS

## 2015-05-12 MED ORDER — HEPARIN SOD (PORK) LOCK FLUSH 100 UNIT/ML IV SOLN
500.0000 [IU] | Freq: Once | INTRAVENOUS | Status: AC | PRN
  Administered 2015-05-12: 500 [IU]
  Filled 2015-05-12: qty 5

## 2015-05-12 MED ORDER — IBUPROFEN 200 MG PO TABS
ORAL_TABLET | ORAL | Status: AC
  Filled 2015-05-12: qty 2

## 2015-05-12 MED ORDER — HYDROMORPHONE HCL 1 MG/ML IJ SOLN
2.0000 mg | INTRAMUSCULAR | Status: DC | PRN
  Administered 2015-05-12: 2 mg via INTRAVENOUS

## 2015-05-13 LAB — TYPE AND SCREEN
ABO/RH(D): O POS
Antibody Screen: POSITIVE
DAT, IgG: NEGATIVE
Unit division: 0
Unit division: 0

## 2015-05-13 NOTE — Progress Notes (Signed)
This encounter was created in error - please disregard.

## 2015-05-14 ENCOUNTER — Ambulatory Visit: Payer: Medicaid Other | Admitting: Nutrition

## 2015-05-14 ENCOUNTER — Other Ambulatory Visit: Payer: Self-pay | Admitting: *Deleted

## 2015-05-14 ENCOUNTER — Ambulatory Visit (HOSPITAL_BASED_OUTPATIENT_CLINIC_OR_DEPARTMENT_OTHER): Payer: Medicaid Other

## 2015-05-14 ENCOUNTER — Other Ambulatory Visit: Payer: Self-pay | Admitting: Hematology and Oncology

## 2015-05-14 ENCOUNTER — Other Ambulatory Visit (HOSPITAL_BASED_OUTPATIENT_CLINIC_OR_DEPARTMENT_OTHER): Payer: Medicaid Other

## 2015-05-14 ENCOUNTER — Emergency Department (HOSPITAL_COMMUNITY)
Admission: EM | Admit: 2015-05-14 | Discharge: 2015-05-14 | Disposition: A | Discharge status: Home / Self Care | Payer: Medicaid Other | Attending: Emergency Medicine | Admitting: Emergency Medicine

## 2015-05-14 ENCOUNTER — Encounter (HOSPITAL_COMMUNITY): Payer: Self-pay | Admitting: Emergency Medicine

## 2015-05-14 VITALS — BP 129/90 | HR 57 | Temp 98.4°F | Resp 18

## 2015-05-14 DIAGNOSIS — Z87891 Personal history of nicotine dependence: Secondary | ICD-10-CM | POA: Insufficient documentation

## 2015-05-14 DIAGNOSIS — E876 Hypokalemia: Secondary | ICD-10-CM | POA: Diagnosis not present

## 2015-05-14 DIAGNOSIS — F419 Anxiety disorder, unspecified: Secondary | ICD-10-CM | POA: Insufficient documentation

## 2015-05-14 DIAGNOSIS — E43 Unspecified severe protein-calorie malnutrition: Secondary | ICD-10-CM

## 2015-05-14 DIAGNOSIS — K219 Gastro-esophageal reflux disease without esophagitis: Secondary | ICD-10-CM | POA: Insufficient documentation

## 2015-05-14 DIAGNOSIS — Z4802 Encounter for removal of sutures: Secondary | ICD-10-CM | POA: Diagnosis present

## 2015-05-14 DIAGNOSIS — Z79899 Other long term (current) drug therapy: Secondary | ICD-10-CM | POA: Insufficient documentation

## 2015-05-14 DIAGNOSIS — J449 Chronic obstructive pulmonary disease, unspecified: Secondary | ICD-10-CM | POA: Insufficient documentation

## 2015-05-14 DIAGNOSIS — C321 Malignant neoplasm of supraglottis: Secondary | ICD-10-CM

## 2015-05-14 DIAGNOSIS — D6481 Anemia due to antineoplastic chemotherapy: Secondary | ICD-10-CM | POA: Diagnosis not present

## 2015-05-14 DIAGNOSIS — T451X5A Adverse effect of antineoplastic and immunosuppressive drugs, initial encounter: Secondary | ICD-10-CM | POA: Diagnosis not present

## 2015-05-14 LAB — CBC WITH DIFFERENTIAL/PLATELET
BASO%: 0.6 % (ref 0.0–2.0)
BASOS ABS: 0 10*3/uL (ref 0.0–0.1)
EOS ABS: 0 10*3/uL (ref 0.0–0.5)
EOS%: 0.6 % (ref 0.0–7.0)
HCT: 31.2 % — ABNORMAL LOW (ref 38.4–49.9)
HEMOGLOBIN: 10.8 g/dL — AB (ref 13.0–17.1)
LYMPH%: 9.6 % — AB (ref 14.0–49.0)
MCH: 30.5 pg (ref 27.2–33.4)
MCHC: 34.6 g/dL (ref 32.0–36.0)
MCV: 88.3 fL (ref 79.3–98.0)
MONO#: 0.2 10*3/uL (ref 0.1–0.9)
MONO%: 8.9 % (ref 0.0–14.0)
NEUT%: 80.3 % — ABNORMAL HIGH (ref 39.0–75.0)
NEUTROS ABS: 2.1 10*3/uL (ref 1.5–6.5)
PLATELETS: 68 10*3/uL — AB (ref 140–400)
RBC: 3.53 10*6/uL — ABNORMAL LOW (ref 4.20–5.82)
RDW: 14.6 % (ref 11.0–14.6)
WBC: 2.6 10*3/uL — ABNORMAL LOW (ref 4.0–10.3)
lymph#: 0.3 10*3/uL — ABNORMAL LOW (ref 0.9–3.3)

## 2015-05-14 LAB — COMPREHENSIVE METABOLIC PANEL (CC13)
ALT: 12 U/L (ref 0–55)
AST: 21 U/L (ref 5–34)
Albumin: 3 g/dL — ABNORMAL LOW (ref 3.5–5.0)
Alkaline Phosphatase: 117 U/L (ref 40–150)
Anion Gap: 10 mEq/L (ref 3–11)
BUN: 13.4 mg/dL (ref 7.0–26.0)
CHLORIDE: 95 meq/L — AB (ref 98–109)
CO2: 34 meq/L — AB (ref 22–29)
CREATININE: 0.7 mg/dL (ref 0.7–1.3)
Calcium: 8.6 mg/dL (ref 8.4–10.4)
EGFR: >90 mL/min/{1.73_m2} (ref >90)
Glucose: 104 mg/dl (ref 70–140)
POTASSIUM: 2.6 meq/L — AB (ref 3.5–5.1)
SODIUM: 140 meq/L (ref 136–145)
Total Bilirubin: 0.49 mg/dL (ref 0.20–1.20)
Total Protein: 6.3 g/dL — ABNORMAL LOW (ref 6.4–8.3)

## 2015-05-14 MED ORDER — SODIUM CHLORIDE 0.9 % IJ SOLN
10.0000 mL | INTRAMUSCULAR | Status: DC | PRN
  Administered 2015-05-14: 10 mL
  Filled 2015-05-14: qty 10

## 2015-05-14 MED ORDER — POTASSIUM CHLORIDE ER 20 MEQ PO TBCR: 20.0000 meq | EXTENDED_RELEASE_TABLET | Freq: Three times a day (TID) | ORAL | Status: DC

## 2015-05-14 MED ORDER — POTASSIUM CHLORIDE 20 MEQ/15ML (10%) PO SOLN
20.0000 meq | Freq: Three times a day (TID) | ORAL | Status: DC
Start: 2015-05-14 — End: 2015-06-08

## 2015-05-14 MED ORDER — HEPARIN SOD (PORK) LOCK FLUSH 100 UNIT/ML IV SOLN
500.0000 [IU] | Freq: Once | INTRAVENOUS | Status: AC | PRN
  Administered 2015-05-14: 500 [IU]
  Filled 2015-05-14: qty 5

## 2015-05-14 MED ORDER — HYDROMORPHONE HCL 4 MG/ML IJ SOLN
INTRAMUSCULAR | Status: AC
  Filled 2015-05-14: qty 1

## 2015-05-14 MED ORDER — SODIUM CHLORIDE 0.9 % IV SOLN
Freq: Once | INTRAVENOUS | Status: AC
  Administered 2015-05-14: 10:00:00 via INTRAVENOUS
  Filled 2015-05-14: qty 1000

## 2015-05-14 MED ORDER — SODIUM CHLORIDE 0.9 % IV SOLN
Freq: Once | INTRAVENOUS | Status: AC
  Administered 2015-05-14: 09:00:00 via INTRAVENOUS
  Filled 2015-05-14: qty 4

## 2015-05-14 MED ORDER — HYDROMORPHONE HCL 1 MG/ML IJ SOLN
2.0000 mg | Freq: Once | INTRAMUSCULAR | Status: AC
  Administered 2015-05-14: 2 mg via INTRAVENOUS
  Filled 2015-05-14: qty 2

## 2015-05-14 MED ORDER — SODIUM CHLORIDE 0.9 % IV SOLN
2000.0000 mL | Freq: Once | INTRAVENOUS | Status: AC
  Administered 2015-05-14: 1000 mL via INTRAVENOUS

## 2015-05-14 NOTE — Progress Notes (Signed)
Pt K+ 2.6.  Per Alvy Bimler, pt to have 30 mEq added to 2nd L of NS to infuse over 3 hours today and each day this week.  Prescription also called in for pt to get K+ through PEG tube TID at facility.  Cameo, RN spoke with RN at facility to confirm this.  Pt notified of plan and no further questions at this time.

## 2015-05-14 NOTE — Telephone Encounter (Signed)
Faxed Rx for Potassium to [facility] fax [phone].  Called [facility] and notified Princess, Therapist, sports of new Rx 20 Meq TID x 10 days.  She says they got it.

## 2015-05-14 NOTE — Discharge Instructions (Signed)
°

## 2015-05-14 NOTE — Patient Instructions (Addendum)
Dehydration, Adult Dehydration is when you lose more fluids from the body than you take in. Vital organs like the kidneys, brain, and heart cannot function without a proper amount of fluids and salt. Any loss of fluids from the body can cause dehydration.  CAUSES   Vomiting.  Diarrhea.  Excessive sweating.  Excessive urine output.  Fever. SYMPTOMS  Mild dehydration  Thirst.  Dry lips.  Slightly dry mouth. Moderate dehydration  Very dry mouth.  Sunken eyes.  Skin does not bounce back quickly when lightly pinched and released.  Dark urine and decreased urine production.  Decreased tear production.  Headache. Severe dehydration  Very dry mouth.  Extreme thirst.  Rapid, weak pulse (more than 100 beats per minute at rest).  Cold hands and feet.  Not able to sweat in spite of heat and temperature.  Rapid breathing.  Blue lips.  Confusion and lethargy.  Difficulty being awakened.  Minimal urine production.  No tears. DIAGNOSIS  Your caregiver will diagnose dehydration based on your symptoms and your exam. Blood and urine tests will help confirm the diagnosis. The diagnostic evaluation should also identify the cause of dehydration. TREATMENT  Treatment of mild or moderate dehydration can often be done at home by increasing the amount of fluids that you drink. It is best to drink small amounts of fluid more often. Drinking too much at one time can make vomiting worse. Refer to the home care instructions below. Severe dehydration needs to be treated at the hospital where you will probably be given intravenous (IV) fluids that contain water and electrolytes. HOME CARE INSTRUCTIONS   Ask your caregiver about specific rehydration instructions.  Drink enough fluids to keep your urine clear or pale yellow.  Drink small amounts frequently if you have nausea and vomiting.  Eat as you normally do.  Avoid:  Foods or drinks high in sugar.  Carbonated  drinks.  Juice.  Extremely hot or cold fluids.  Drinks with caffeine.  Fatty, greasy foods.  Alcohol.  Tobacco.  Overeating.  Gelatin desserts.  Wash your hands well to avoid spreading bacteria and viruses.  Only take over-the-counter or prescription medicines for pain, discomfort, or fever as directed by your caregiver.  Ask your caregiver if you should continue all prescribed and over-the-counter medicines.  Keep all follow-up appointments with your caregiver. SEEK MEDICAL CARE IF:  You have abdominal pain and it increases or stays in one area (localizes).  You have a rash, stiff neck, or severe headache.  You are irritable, sleepy, or difficult to awaken.  You are weak, dizzy, or extremely thirsty. SEEK IMMEDIATE MEDICAL CARE IF:   You are unable to keep fluids down or you get worse despite treatment.  You have frequent episodes of vomiting or diarrhea.  You have blood or green matter (bile) in your vomit.  You have blood in your stool or your stool looks black and tarry.  You have not urinated in 6 to 8 hours, or you have only urinated a small amount of very dark urine.  You have a fever.  You faint. MAKE SURE YOU:   Understand these instructions.  Will watch your condition.  Will get help right away if you are not doing well or get worse. Document Released: 08/04/2005 Document Revised: 10/27/2011 Document Reviewed: 03/24/2011 ExitCare Patient Information 2015 [employer]. This information is not intended to replace advice given to you by your health care provider. Make sure you discuss any questions you have with your health care   provider. Hypokalemia Hypokalemia means that the amount of potassium in the blood is lower than normal.Potassium is a chemical, called an electrolyte, that helps regulate the amount of fluid in the body. It also stimulates muscle contraction and helps nerves function properly.Most of the body's potassium is inside of  cells, and only a very small amount is in the blood. Because the amount in the blood is so small, minor changes can be life-threatening. CAUSES  Antibiotics.  Diarrhea or vomiting.  Using laxatives too much, which can cause diarrhea.  Chronic kidney disease.  Water pills (diuretics).  Eating disorders (bulimia).  Low magnesium level.  Sweating a lot. SIGNS AND SYMPTOMS  Weakness.  Constipation.  Fatigue.  Muscle cramps.  Mental confusion.  Skipped heartbeats or irregular heartbeat (palpitations).  Tingling or numbness. DIAGNOSIS  Your health care provider can diagnose hypokalemia with blood tests. In addition to checking your potassium level, your health care provider may also check other lab tests. TREATMENT Hypokalemia can be treated with potassium supplements taken by mouth or adjustments in your current medicines. If your potassium level is very low, you may need to get potassium through a vein (IV) and be monitored in the hospital. A diet high in potassium is also helpful. Foods high in potassium are:  Nuts, such as peanuts and pistachios.  Seeds, such as sunflower seeds and pumpkin seeds.  Peas, lentils, and lima beans.  Whole grain and bran cereals and breads.  Fresh fruit and vegetables, such as apricots, avocado, bananas, cantaloupe, kiwi, oranges, tomatoes, asparagus, and potatoes.  Orange and tomato juices.  Red meats.  Fruit yogurt. HOME CARE INSTRUCTIONS  Take all medicines as prescribed by your health care provider.  Maintain a healthy diet by including nutritious food, such as fruits, vegetables, nuts, whole grains, and lean meats.  If you are taking a laxative, be sure to follow the directions on the label. SEEK MEDICAL CARE IF:  Your weakness gets worse.  You feel your heart pounding or racing.  You are vomiting or having diarrhea.  You are diabetic and having trouble keeping your blood glucose in the normal range. SEEK IMMEDIATE  MEDICAL CARE IF:  You have chest pain, shortness of breath, or dizziness.  You are vomiting or having diarrhea for more than 2 days.  You faint. MAKE SURE YOU:   Understand these instructions.  Will watch your condition.  Will get help right away if you are not doing well or get worse. Document Released: 08/04/2005 Document Revised: 05/25/2013 Document Reviewed: 02/04/2013 ExitCare Patient Information 2015 [employer]. This information is not intended to replace advice given to you by your health care provider. Make sure you discuss any questions you have with your health care provider.  

## 2015-05-14 NOTE — ED Provider Notes (Signed)
CSN: [id number]     Arrival date & time 05/14/15  1358 History  This chart was scribed for non-physician practitioner, [name], PA-C working with [name], MD by [name], ED scribe. This patient was seen in room WTR8/WTR8 and the patient's care was started at 2:35 PM.   Chief Complaint  Patient presents with  . Suture / Staple Removal    sutures over l/eyebrow   The history is provided by the patient. No language interpreter was used.    HPI Comments: Marvin [name] is a 39 y.o. male who presents to the Emergency Department for suture removal to his left upper eye 6 days s/p having sutures put in place at [facility]. Pt had 6 sutures put in place after falling and striking the affected area against a metal object in his restroom due to a syncopal episode that occurred. Pt denies any fevers, chills, redness, swelling or discharge from the wound.    Past Medical History  Diagnosis Date  . ETOH abuse   . Alcohol dependence 04/17/2012  . COPD (chronic obstructive pulmonary disease)   . Shortness of breath dyspnea     occ  . Anxiety   . GERD (gastroesophageal reflux disease)   . Seizures     one episode 09/2012, suspected related to ETOH withdrawal  . Anxiety disorder 03/12/2015  . Headache 05/10/2015  . Hypokalemia 05/10/2015  . 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 Knee surgery Left     kneecap -screws  . Appendectomy    . Tracheostomy tube placement N/A 02/22/2015    Procedure: TRACHEOSTOMY;  Surgeon: [name], MD;  Location: [facility];  Service: ENT;  Laterality: N/A;  awake tracheostomy  . Direct laryngoscopy N/A 02/22/2015    Procedure: DIRECT LARYNGOSCOPY;  Surgeon: [name], MD;  Location: [place];  Service: ENT;  Laterality: N/A;  direct laryngoscopy with biopsy  . Multiple extractions with alveoloplasty N/A 03/05/2015    Procedure: Extraction of tooth #'s 1,2,8,9,15,16,17,18,31,32 with alveoloplasty,  mandibular right lateral exostoses, and gross debridement of remaining teeth;  Surgeon: Lenn Cal, DDS;  Location: [place];  Service: Oral Surgery;  Laterality: N/A;   Family History  Problem Relation Age of Onset  . Cancer Mother    Social History  Substance Use Topics  . Smoking status: Former Smoker -- 1.00 packs/day for 28 years    Types: Cigarettes    Quit date: 01/17/2015  . Smokeless tobacco: Never Used  . Alcohol Use: 0.0 oz/week    0 Standard drinks or equivalent per week     Comment: 6 -40-oz. beers daily    Review of Systems  Constitutional: Negative for fever and chills.  Skin: Negative for color change and rash.  All other systems reviewed and are negative.  Allergies  Review of patient's allergies indicates no known allergies.  Home Medications   Prior to Admission medications   Medication Sig Start Date End Date Taking? Authorizing Provider  bisacodyl (DULCOLAX) 10 MG suppository Place 10 mg rectally as needed for moderate constipation.    Historical Provider, MD  butalbital-acetaminophen-caffeine (FIORICET) 50-325-40 MG per tablet Take 1 tablet by mouth every 4 (four) hours as needed for headache. 05/10/15 05/09/16  [name], MD  citalopram (CELEXA) 20 MG tablet 20 mg by PEG Tube route daily.    Historical Provider, MD  feeding supplement, ENSURE, (ENSURE) PUDG Take 1 Container by mouth 3 (three) times daily between  meals.    Historical Provider, MD  fentaNYL (DURAGESIC - DOSED MCG/HR) 50 MCG/HR Place 1 patch (50 mcg total) onto the skin every 3 (three) days. 04/25/15   [name], MD  folic acid (FOLVITE) 1 MG tablet Take 1 tablet (1 mg total) by mouth daily. Patient taking differently: 1 mg by PEG Tube route daily.  03/08/15   [name], MD  HYDROcodone-acetaminophen (HYCET) 7.5-325 mg/15 ml solution Take 15 mLs by mouth 4 (four) times daily as needed for moderate pain. 03/08/15   [name], MD  ibuprofen (ADVIL,MOTRIN) 100 MG/5ML suspension Take 200-400  mg by mouth every 4 (four) hours as needed for mild pain.     Historical Provider, MD  lidocaine (XYLOCAINE) 2 % solution Mix 1 part 2%viscous lidocaine,1part H2O.Swish and/or swallow 66ml of this mixture,37min before meals and at bedtime, up to QID Patient taking differently: Mix 1 part 2%viscous lidocaine,1part H2O.Swish and/or swallow 30ml of this mixture,36min before meals and at bedtime, up to QID as needed for sore throat 04/02/15   [name], MD  lidocaine-prilocaine (EMLA) cream Apply 1 application topically as needed (FOR PORT ACCESS).    Historical Provider, MD  LORazepam (ATIVAN) 1 MG tablet Take 0.5 mg by mouth 3 (three) times daily.     Historical Provider, MD  magnesium hydroxide (MILK OF MAGNESIA) 400 MG/5ML suspension Take 30 mLs by mouth daily as needed for mild constipation.    Historical Provider, MD  morphine (ROXANOL) 20 MG/ML concentrated solution Take 1 mL (20 mg total) by mouth every 2 (two) hours as needed for moderate pain or severe pain. 03/26/15   [name], MD  Multiple Vitamin (MULTIVITAMIN WITH MINERALS) TABS tablet Take 1 tablet by mouth daily. Patient taking differently: 1 tablet by PEG Tube route daily.  03/08/15   [name], MD  nicotine (NICODERM CQ - DOSED IN MG/24 HOURS) 21 mg/24hr patch Place 1 patch (21 mg total) onto the skin daily. Patient not taking: Reported on 05/08/2015 03/08/15   [name], MD  Nutritional Supplements (FEEDING SUPPLEMENT, JEVITY 1.2 CAL,) LIQD Give 1 can Jevity 1.2 via PEG TID between meals with 125 ml free water before and after bolus feeding.  In addition, give 250 cc free water TID with meals. 03/27/15   [name], MD  omeprazole (PRILOSEC) 20 MG capsule 20 mg by PEG Tube route daily.    Historical Provider, MD  ondansetron (ZOFRAN) 8 MG tablet 8 mg by PEG Tube route every 8 (eight) hours as needed for nausea or vomiting.    Historical Provider, MD  potassium chloride 20 MEQ/15ML (10%) SOLN Place 15 mLs (20 mEq total) into feeding  tube 3 (three) times daily. 05/14/15   [name], MD  pravastatin (PRAVACHOL) 20 MG tablet 20 mg by PEG Tube route daily.    Historical Provider, MD  [name]    Historical Provider, MD  prochlorperazine (COMPAZINE) 10 MG tablet 10 mg by PEG Tube route every 6 (six) hours as needed for nausea or vomiting.    Historical Provider, MD  sodium fluoride (PREVIDENT 5000 PLUS) 1.1 % CREA dental cream Apply thin ribbon of cream to tooth brush. Brush teeth for 2 minutes. Spit out excess-DO NOT swallow. DO NOT rinse afterwards. Repeat nightly. 04/03/15   Lenn Cal, DDS  sodium phosphate (FLEET) enema Place 1 enema rectally once as needed (for constipation). follow package directions    Historical Provider, MD  sucralfate (CARAFATE) 1 G tablet Dissolve 1 tablet in  89ml H2O and swallow up to QID,PRN sore throat. 04/02/15   [name], MD  thiamine 100 MG tablet Take 1 tablet (100 mg total) by mouth daily. Patient taking differently: 100 mg by PEG Tube route daily.  03/08/15   [name], MD   BP 120/87 mmHg  Pulse 68  Temp(Src) 98.8 F (37.1 C)  Resp 20  SpO2 99% Physical Exam  Constitutional: He is oriented to person, place, and time. He appears well-developed and well-nourished.  HENT:  Head: Normocephalic and atraumatic.  Mouth/Throat: No oropharyngeal exudate.  Eyes:  Well healing lac to L eye brow.  Neck: Normal range of motion. No tracheal deviation present.  Trach in place, functions appropriately.  Cardiovascular: Normal rate, regular rhythm and normal heart sounds.  Exam reveals no gallop and no friction rub.   No murmur heard. Pulmonary/Chest: Effort normal. No respiratory distress. He has no wheezes. He has no rales.  Lungs clear to auscultation bilaterally  Abdominal: Soft. There is no tenderness.  Musculoskeletal: Normal range of motion.  Neurological: He is alert and oriented to person, place, and time.  Skin: Skin is warm and dry. He is not  diaphoretic.  Psychiatric: He has a normal mood and affect. His behavior is normal.  Nursing note and vitals reviewed.   ED Course  Procedures  DIAGNOSTIC STUDIES: Oxygen Saturation is 100% on RA, normal by my interpretation.    COORDINATION OF CARE: 2:40 PM Discussed treatment plan with pt at bedside and pt agreed to plan.  SUTURE REMOVAL Performed by: [name], PA-C Authorized by: [name], MD Consent: Verbal consent obtained. Consent given by: patient Required items: required blood products, implants, devices, and special equipment available  Time out: Immediately prior to procedure a "time out" was called to verify the correct patient, procedure, equipment, support staff and site/side marked as required. Location: Left eyebrow Wound Appearance: clean  Sutures Removed: 6  Post-removal:  Patient tolerance: Patient tolerated the procedure well with no immediate complications.  Labs Review Labs Reviewed - No data to display  Imaging Review No results found. I have personally reviewed and evaluated these images and lab results as part of my medical decision-making.   EKG Interpretation None      MDM  Patient presents for suture removal. The wound is well healed without signs of infection.  The sutures are removed. Wound care and activity instructions given. Return prn. VSS, nontoxic  Final diagnoses:  Visit for suture removal   I personally performed the services described in this documentation, which was scribed in my presence. The recorded information has been reviewed and is accurate.   [name], PA-C 05/15/15 [place], MD 05/17/15 [phone]

## 2015-05-14 NOTE — Progress Notes (Signed)
Nutrition follow-up completed with patient during infusion. Patient has been treated for cancer of the larynx. Patient is on a dysphagia 1, pudding thick liquid diet with 3 cans of Isosource 1.5 TID bolus with 125 ml free water before and after. Nocturnal Tube feedings have been added utilizing Isosource 1.5 @ 60 ml/hr over 12 hours, 7a-7p.   Additional free water flushes of 250 cc QID. This will provide total of 2205 kcal, 99.2 g Protein and 2642 ml free H20,day.  Patient reports he is trying to increase his oral intake which has been diminished over the past 2 weeks. Weight documented as 131 pounds on September 20.  Estimated nutrition needs: 1800-2100 calories, 90-102 g protein, 2.2 L fluid.  Nutrition diagnosis: Unintended weight loss has continued.  Intervention:  Educated patient to increase oral intake as tolerated being sure to thicken liquids to pudding thick concentration. Encouraged patient continue nausea medications as needed for nausea. Teach back method used.  Monitoring, evaluation, goals: Patient will tolerate oral intake plus tube feedings to meet at least 90% of estimated needs to promote weight gain.  Next visit: Follow-up has not been scheduled.  Patient is being followed by [facility] registered dietitian.  **Disclaimer: This note was dictated with voice recognition software. Similar sounding words can inadvertently be transcribed and this note may contain transcription errors which may not have been corrected upon publication of note.**

## 2015-05-14 NOTE — ED Notes (Signed)
Pt reports that fell against metal object. Sutures noted above l/eyebrow.Suture line intact , no swelling or redness noted

## 2015-05-15 ENCOUNTER — Other Ambulatory Visit: Payer: Self-pay | Admitting: Hematology and Oncology

## 2015-05-15 ENCOUNTER — Ambulatory Visit (HOSPITAL_BASED_OUTPATIENT_CLINIC_OR_DEPARTMENT_OTHER): Payer: Medicaid Other

## 2015-05-15 VITALS — BP 125/83 | HR 64 | Temp 98.7°F | Resp 16

## 2015-05-15 DIAGNOSIS — C321 Malignant neoplasm of supraglottis: Secondary | ICD-10-CM | POA: Diagnosis not present

## 2015-05-15 DIAGNOSIS — E876 Hypokalemia: Secondary | ICD-10-CM

## 2015-05-15 DIAGNOSIS — E43 Unspecified severe protein-calorie malnutrition: Secondary | ICD-10-CM

## 2015-05-15 MED ORDER — ONDANSETRON HCL 40 MG/20ML IJ SOLN
Freq: Once | INTRAMUSCULAR | Status: AC
  Administered 2015-05-15: 13:00:00 via INTRAVENOUS
  Filled 2015-05-15: qty 4

## 2015-05-15 MED ORDER — POTASSIUM CHLORIDE 10 MEQ/100ML IV SOLN
10.0000 meq | INTRAVENOUS | Status: DC
  Filled 2015-05-15: qty 100

## 2015-05-15 MED ORDER — SODIUM CHLORIDE 0.9 % IV SOLN
Freq: Once | INTRAVENOUS | Status: AC
  Administered 2015-05-15: 13:00:00 via INTRAVENOUS

## 2015-05-15 MED ORDER — SODIUM CHLORIDE 0.9 % IV SOLN
INTRAVENOUS | Status: DC
  Administered 2015-05-15: 13:00:00 via INTRAVENOUS
  Filled 2015-05-15: qty 1000

## 2015-05-15 MED ORDER — HYDROMORPHONE HCL 4 MG/ML IJ SOLN
INTRAMUSCULAR | Status: AC
Start: 2015-05-15 — End: 2015-05-15
  Filled 2015-05-15: qty 1

## 2015-05-15 MED ORDER — HEPARIN SOD (PORK) LOCK FLUSH 100 UNIT/ML IV SOLN
500.0000 [IU] | Freq: Once | INTRAVENOUS | Status: AC | PRN
  Administered 2015-05-15: 500 [IU]
  Filled 2015-05-15: qty 5

## 2015-05-15 MED ORDER — HYDROMORPHONE HCL 1 MG/ML IJ SOLN
2.0000 mg | INTRAMUSCULAR | Status: DC | PRN
  Administered 2015-05-15: 2 mg via INTRAVENOUS

## 2015-05-15 MED ORDER — HYDROMORPHONE HCL 4 MG/ML IJ SOLN: 2.0000 mg | INTRAMUSCULAR | Status: DC | PRN

## 2015-05-15 MED ORDER — SODIUM CHLORIDE 0.9 % IJ SOLN
10.0000 mL | INTRAMUSCULAR | Status: DC | PRN
  Administered 2015-05-15: 10 mL
  Filled 2015-05-15: qty 10

## 2015-05-15 NOTE — Patient Instructions (Signed)
Dehydration, Adult Dehydration is when you lose more fluids from the body than you take in. Vital organs like the kidneys, brain, and heart cannot function without a proper amount of fluids and salt. Any loss of fluids from the body can cause dehydration.  CAUSES   Vomiting.  Diarrhea.  Excessive sweating.  Excessive urine output.  Fever. SYMPTOMS  Mild dehydration  Thirst.  Dry lips.  Slightly dry mouth. Moderate dehydration  Very dry mouth.  Sunken eyes.  Skin does not bounce back quickly when lightly pinched and released.  Dark urine and decreased urine production.  Decreased tear production.  Headache. Severe dehydration  Very dry mouth.  Extreme thirst.  Rapid, weak pulse (more than 100 beats per minute at rest).  Cold hands and feet.  Not able to sweat in spite of heat and temperature.  Rapid breathing.  Blue lips.  Confusion and lethargy.  Difficulty being awakened.  Minimal urine production.  No tears. DIAGNOSIS  Your caregiver will diagnose dehydration based on your symptoms and your exam. Blood and urine tests will help confirm the diagnosis. The diagnostic evaluation should also identify the cause of dehydration. TREATMENT  Treatment of mild or moderate dehydration can often be done at home by increasing the amount of fluids that you drink. It is best to drink small amounts of fluid more often. Drinking too much at one time can make vomiting worse. Refer to the home care instructions below. Severe dehydration needs to be treated at the hospital where you will probably be given intravenous (IV) fluids that contain water and electrolytes. HOME CARE INSTRUCTIONS   Ask your caregiver about specific rehydration instructions.  Drink enough fluids to keep your urine clear or pale yellow.  Drink small amounts frequently if you have nausea and vomiting.  Eat as you normally do.  Avoid:  Foods or drinks high in sugar.  Carbonated  drinks.  Juice.  Extremely hot or cold fluids.  Drinks with caffeine.  Fatty, greasy foods.  Alcohol.  Tobacco.  Overeating.  Gelatin desserts.  Wash your hands well to avoid spreading bacteria and viruses.  Only take over-the-counter or prescription medicines for pain, discomfort, or fever as directed by your caregiver.  Ask your caregiver if you should continue all prescribed and over-the-counter medicines.  Keep all follow-up appointments with your caregiver. SEEK MEDICAL CARE IF:  You have abdominal pain and it increases or stays in one area (localizes).  You have a rash, stiff neck, or severe headache.  You are irritable, sleepy, or difficult to awaken.  You are weak, dizzy, or extremely thirsty. SEEK IMMEDIATE MEDICAL CARE IF:   You are unable to keep fluids down or you get worse despite treatment.  You have frequent episodes of vomiting or diarrhea.  You have blood or green matter (bile) in your vomit.  You have blood in your stool or your stool looks black and tarry.  You have not urinated in 6 to 8 hours, or you have only urinated a small amount of very dark urine.  You have a fever.  You faint. MAKE SURE YOU:   Understand these instructions.  Will watch your condition.  Will get help right away if you are not doing well or get worse. Document Released: 08/04/2005 Document Revised: 10/27/2011 Document Reviewed: 03/24/2011 Exit[employer] Patient Information 2015 [employer]. This information is not intended to replace advice given to you by your health care provider. Make sure you discuss any questions you have with your health care   provider. Hypokalemia Hypokalemia means that the amount of potassium in the blood is lower than normal.Potassium is a chemical, called an electrolyte, that helps regulate the amount of fluid in the body. It also stimulates muscle contraction and helps nerves function properly.Most of the body's potassium is inside of  cells, and only a very small amount is in the blood. Because the amount in the blood is so small, minor changes can be life-threatening. CAUSES  Antibiotics.  Diarrhea or vomiting.  Using laxatives too much, which can cause diarrhea.  Chronic kidney disease.  Water pills (diuretics).  Eating disorders (bulimia).  Low magnesium level.  Sweating a lot. SIGNS AND SYMPTOMS  Weakness.  Constipation.  Fatigue.  Muscle cramps.  Mental confusion.  Skipped heartbeats or irregular heartbeat (palpitations).  Tingling or numbness. DIAGNOSIS  Your health care provider can diagnose hypokalemia with blood tests. In addition to checking your potassium level, your health care provider may also check other lab tests. TREATMENT Hypokalemia can be treated with potassium supplements taken by mouth or adjustments in your current medicines. If your potassium level is very low, you may need to get potassium through a vein (IV) and be monitored in the hospital. A diet high in potassium is also helpful. Foods high in potassium are:  Nuts, such as peanuts and pistachios.  Seeds, such as sunflower seeds and pumpkin seeds.  Peas, lentils, and lima beans.  Whole grain and bran cereals and breads.  Fresh fruit and vegetables, such as apricots, avocado, bananas, cantaloupe, kiwi, oranges, tomatoes, asparagus, and potatoes.  Orange and tomato juices.  Red meats.  Fruit yogurt. HOME CARE INSTRUCTIONS  Take all medicines as prescribed by your health care provider.  Maintain a healthy diet by including nutritious food, such as fruits, vegetables, nuts, whole grains, and lean meats.  If you are taking a laxative, be sure to follow the directions on the label. SEEK MEDICAL CARE IF:  Your weakness gets worse.  You feel your heart pounding or racing.  You are vomiting or having diarrhea.  You are diabetic and having trouble keeping your blood glucose in the normal range. SEEK IMMEDIATE  MEDICAL CARE IF:  You have chest pain, shortness of breath, or dizziness.  You are vomiting or having diarrhea for more than 2 days.  You faint. MAKE SURE YOU:   Understand these instructions.  Will watch your condition.  Will get help right away if you are not doing well or get worse. Document Released: 08/04/2005 Document Revised: 05/25/2013 Document Reviewed: 02/04/2013 ExitCare Patient Information 2015 [employer]. This information is not intended to replace advice given to you by your health care provider. Make sure you discuss any questions you have with your health care provider.  

## 2015-05-16 ENCOUNTER — Ambulatory Visit (HOSPITAL_BASED_OUTPATIENT_CLINIC_OR_DEPARTMENT_OTHER): Payer: Medicaid Other

## 2015-05-16 ENCOUNTER — Ambulatory Visit (HOSPITAL_BASED_OUTPATIENT_CLINIC_OR_DEPARTMENT_OTHER): Payer: Medicaid Other | Admitting: Hematology and Oncology

## 2015-05-16 ENCOUNTER — Telehealth: Payer: Self-pay | Admitting: *Deleted

## 2015-05-16 ENCOUNTER — Encounter: Payer: Self-pay | Admitting: Hematology and Oncology

## 2015-05-16 VITALS — BP 137/88 | HR 52 | Temp 98.7°F | Resp 18

## 2015-05-16 DIAGNOSIS — T451X5A Adverse effect of antineoplastic and immunosuppressive drugs, initial encounter: Secondary | ICD-10-CM

## 2015-05-16 DIAGNOSIS — Z931 Gastrostomy status: Secondary | ICD-10-CM | POA: Diagnosis not present

## 2015-05-16 DIAGNOSIS — E876 Hypokalemia: Secondary | ICD-10-CM | POA: Diagnosis not present

## 2015-05-16 DIAGNOSIS — E43 Unspecified severe protein-calorie malnutrition: Secondary | ICD-10-CM

## 2015-05-16 DIAGNOSIS — E860 Dehydration: Secondary | ICD-10-CM

## 2015-05-16 DIAGNOSIS — K1231 Oral mucositis (ulcerative) due to antineoplastic therapy: Secondary | ICD-10-CM

## 2015-05-16 DIAGNOSIS — C321 Malignant neoplasm of supraglottis: Secondary | ICD-10-CM

## 2015-05-16 DIAGNOSIS — D61810 Antineoplastic chemotherapy induced pancytopenia: Secondary | ICD-10-CM | POA: Diagnosis not present

## 2015-05-16 MED ORDER — HYDROMORPHONE HCL 4 MG/ML IJ SOLN
INTRAMUSCULAR | Status: AC
  Filled 2015-05-16: qty 1

## 2015-05-16 MED ORDER — HEPARIN SOD (PORK) LOCK FLUSH 100 UNIT/ML IV SOLN
500.0000 [IU] | Freq: Once | INTRAVENOUS | Status: AC | PRN
  Administered 2015-05-16: 500 [IU]
  Filled 2015-05-16: qty 5

## 2015-05-16 MED ORDER — SODIUM CHLORIDE 0.9 % IJ SOLN
10.0000 mL | INTRAMUSCULAR | Status: DC | PRN
  Administered 2015-05-16: 10 mL
  Filled 2015-05-16: qty 10

## 2015-05-16 MED ORDER — SODIUM CHLORIDE 0.9 % IV SOLN
30.0000 meq | Freq: Once | INTRAVENOUS | Status: AC
  Administered 2015-05-16: 30 meq via INTRAVENOUS
  Filled 2015-05-16: qty 15

## 2015-05-16 MED ORDER — SODIUM CHLORIDE 0.9 % IV SOLN
Freq: Once | INTRAVENOUS | Status: AC
  Administered 2015-05-16: 10:00:00 via INTRAVENOUS

## 2015-05-16 MED ORDER — HYDROMORPHONE HCL 1 MG/ML IJ SOLN
2.0000 mg | INTRAMUSCULAR | Status: DC | PRN
  Administered 2015-05-16: 2 mg via INTRAVENOUS
  Filled 2015-05-16: qty 2

## 2015-05-16 MED ORDER — HYDROMORPHONE HCL 4 MG PO TABS: ORAL_TABLET | ORAL | Status: DC

## 2015-05-16 MED ORDER — SODIUM CHLORIDE 0.9 % IV SOLN
Freq: Once | INTRAVENOUS | Status: AC
  Administered 2015-05-16: 10:00:00 via INTRAVENOUS
  Filled 2015-05-16: qty 4

## 2015-05-16 NOTE — Assessment & Plan Note (Signed)
He has significant pancytopenia related to recent chemotherapy. He does not require any transfusion right now.

## 2015-05-16 NOTE — Progress Notes (Signed)
[facility] OFFICE PROGRESS NOTE  Patient Care Team: No Pcp Per Patient as PCP - General (General Practice) [name], MD as Consulting Physician (Otolaryngology) [name], MD as Attending Physician (Radiation Oncology) [name], MD as Consulting Physician (Hematology and Oncology) Leota Sauers, RN as Oncology Nurse Navigator  SUMMARY OF ONCOLOGIC HISTORY: Oncology History   Carcinoma of supraglottis   Staging form: Larynx - Supraglottis, AJCC 7th Edition     Clinical stage from 03/13/2015: Stage IVA (T3, N2b, M0) - Signed by [name], MD on 03/13/2015       Carcinoma of supraglottis   01/22/2015 Imaging CT scan showed enhancing infiltrative mass centered at the right piriform sinus with bilateral neck lymphadenopathy   02/22/2015 - 03/08/2015 Hospital Admission He was admitted to the hospital for management of advanced supraglottic cancer status post tracheostomy, biopsy, dental extraction, placement of feeding tube and subsequent discharge to skilled nursing facility   02/22/2015 Pathology Results Accession: [id number] 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   03/09/2015 PET scan 1)  Right piriform sinus mass with right level 2 nodal metastasis.  2)  Hypermetabolic thoracic nodes which are at least partially felt to be reactive.  3)  No evidence of subdiaphragmatic metastatic disease.    03/16/2015 Procedure He has placement of port   03/21/2015 - 05/04/2015 Chemotherapy He received high dose cisplatin x 3 cycles   03/21/2015 - 05/09/2015 Radiation Therapy He received radiation treatment with chemotherapy   05/09/2015 Miscellaneous He went to the ED with fall    INTERVAL HISTORY: Please see below for problem oriented charting. He is doing better since last time I saw him. Headache has resolved. His sutures had been removed and he denies recent  fall. He is able to tolerate oral diet better but still complaining of severe sore throat. He felt that Dilaudid is helping him more than other form of pain medicine. He denies nausea or vomiting.  REVIEW OF SYSTEMS:   Constitutional: Denies fevers, chills or abnormal weight loss Eyes: Denies blurriness of vision Respiratory: Denies cough, dyspnea or wheezes Cardiovascular: Denies palpitation, chest discomfort or lower extremity swelling Gastrointestinal:  Denies nausea, heartburn or change in bowel habits Skin: Denies abnormal skin rashes Lymphatics: Denies new lymphadenopathy or easy bruising Neurological:Denies numbness, tingling or new weaknesses Behavioral/Psych: Mood is stable, no new changes  All other systems were reviewed with the patient and are negative.  I have reviewed the past medical history, past surgical history, social history and family history with the patient and they are unchanged from previous note.  ALLERGIES:  has No Known Allergies.  MEDICATIONS:  Current Outpatient Prescriptions  Medication Sig Dispense Refill  . bisacodyl (DULCOLAX) 10 MG suppository Place 10 mg rectally as needed for moderate constipation.    . butalbital-acetaminophen-caffeine (FIORICET) 50-325-40 MG per tablet Take 1 tablet by mouth every 4 (four) hours as needed for headache. 60 tablet 0  . citalopram (CELEXA) 20 MG tablet 20 mg by PEG Tube route daily.    . feeding supplement, ENSURE, (ENSURE) PUDG Take 1 Container by mouth 3 (three) times daily between meals.    . fentaNYL (DURAGESIC - DOSED MCG/HR) 50 MCG/HR Place 1 patch (50 mcg total) onto the skin every 3 (three) days. 5 patch 0  . folic acid (FOLVITE) 1 MG tablet Take 1 tablet (1 mg total) by mouth daily. (Patient taking differently: 1 mg  by PEG Tube route daily. ) 30 tablet 0  . HYDROcodone-acetaminophen (HYCET) 7.5-325 mg/15 ml solution Take 15 mLs by mouth 4 (four) times daily as needed for moderate pain. 120 mL 0  .  HYDROmorphone (DILAUDID) 4 MG tablet Take 1 tablets 30 minutes before meals, up to maximum 6 tablets per day if needed 60 tablet 0  . ibuprofen (ADVIL,MOTRIN) 100 MG/5ML suspension Take 200-400 mg by mouth every 4 (four) hours as needed for mild pain.     Marland Kitchen lidocaine (XYLOCAINE) 2 % solution Mix 1 part 2%viscous lidocaine,1part H2O.Swish and/or swallow 63ml of this mixture,39min before meals and at bedtime, up to QID (Patient taking differently: Mix 1 part 2%viscous lidocaine,1part H2O.Swish and/or swallow 9ml of this mixture,17min before meals and at bedtime, up to QID as needed for sore throat) 100 mL 5  . lidocaine-prilocaine (EMLA) cream Apply 1 application topically as needed (FOR PORT ACCESS).    . LORazepam (ATIVAN) 1 MG tablet Take 0.5 mg by mouth 3 (three) times daily.     . magnesium hydroxide (MILK OF MAGNESIA) 400 MG/5ML suspension Take 30 mLs by mouth daily as needed for mild constipation.    Marland Kitchen morphine (ROXANOL) 20 MG/ML concentrated solution Take 1 mL (20 mg total) by mouth every 2 (two) hours as needed for moderate pain or severe pain. 240 mL 0  . Multiple Vitamin (MULTIVITAMIN WITH MINERALS) TABS tablet Take 1 tablet by mouth daily. (Patient taking differently: 1 tablet by PEG Tube route daily. ) 30 tablet 0  . nicotine (NICODERM CQ - DOSED IN MG/24 HOURS) 21 mg/24hr patch Place 1 patch (21 mg total) onto the skin daily. (Patient not taking: Reported on 05/08/2015) 28 patch 0  . Nutritional Supplements (FEEDING SUPPLEMENT, JEVITY 1.2 CAL,) LIQD Give 1 can Jevity 1.2 via PEG TID between meals with 125 ml free water before and after bolus feeding.  In addition, give 250 cc free water TID with meals. 711 mL 0  . omeprazole (PRILOSEC) 20 MG capsule 20 mg by PEG Tube route daily.    . ondansetron (ZOFRAN) 8 MG tablet 8 mg by PEG Tube route every 8 (eight) hours as needed for nausea or vomiting.    . potassium chloride 20 MEQ/15ML (10%) SOLN Place 15 mLs (20 mEq total) into feeding tube 3  (three) times daily. 473 mL 0  . pravastatin (PRAVACHOL) 20 MG tablet 20 mg by PEG Tube route daily.    Marland Kitchen PRESCRIPTION MEDICATION CHEMO [facility]    . prochlorperazine (COMPAZINE) 10 MG tablet 10 mg by PEG Tube route every 6 (six) hours as needed for nausea or vomiting.    . sodium fluoride (PREVIDENT 5000 PLUS) 1.1 % CREA dental cream Apply thin ribbon of cream to tooth brush. Brush teeth for 2 minutes. Spit out excess-DO NOT swallow. DO NOT rinse afterwards. Repeat nightly. 1 Tube prn  . sodium phosphate (FLEET) enema Place 1 enema rectally once as needed (for constipation). follow package directions    . sucralfate (CARAFATE) 1 G tablet Dissolve 1 tablet in 73ml H2O and swallow up to QID,PRN sore throat. 60 tablet 5  . thiamine 100 MG tablet Take 1 tablet (100 mg total) by mouth daily. (Patient taking differently: 100 mg by PEG Tube route daily. ) 30 tablet 0   No current facility-administered medications for this visit.   Facility-Administered Medications Ordered in Other Visits  Medication Dose Route Frequency Provider Last Rate Last Dose  . heparin lock flush 100 unit/mL  500 Units  Intracatheter Once PRN [name], MD      . HYDROmorphone (DILAUDID) injection 2 mg  2 mg Intravenous Q2H PRN [name], MD   2 mg at 05/10/15 1043  . HYDROmorphone (DILAUDID) injection 2 mg  2 mg Intravenous Q2H PRN [name], MD   2 mg at 05/16/15 0950  . ondansetron (ZOFRAN) 8 mg in sodium chloride 0.9 % 50 mL IVPB   Intravenous Once [name], MD      . ondansetron (ZOFRAN) 8 mg in sodium chloride 0.9 % 50 mL IVPB   Intravenous Once [name], MD      . sodium chloride 0.9 % 1,000 mL with potassium chloride 30 mEq infusion - Peripheral Line  30 mEq Intravenous Once [name], MD      . sodium chloride 0.9 % injection 10 mL  10 mL Intracatheter PRN Laloni Rowton, MD      . sodium chloride 0.9 % injection 10 mL  10 mL Intracatheter PRN [name], MD        PHYSICAL EXAMINATION: ECOG PERFORMANCE STATUS: 2 -  Symptomatic, <50% confined to bed GENERAL:alert, no distress and comfortable SKIN: The dermatitis around his neck has improved.  EYES: normal, Conjunctiva are pink and non-injected, sclera clear OROPHARYNX:no exudate, no erythema and lips, buccal mucosa, and tongue normal  NECK: Trachea stem in situ without signs of infection.  LYMPH:  no palpable lymphadenopathy in the cervical, axillary or inguinal LUNGS: clear to auscultation and percussion with normal breathing effort HEART: regular rate & rhythm and no murmurs and no lower extremity edema ABDOMEN:abdomen soft, non-tender and normal bowel sounds. Feeding tube site looks okay Musculoskeletal:no cyanosis of digits and no clubbing  NEURO: alert & oriented x 3 with fluent speech, no focal motor/sensory deficits  LABORATORY DATA:  I have reviewed the data as listed    Component Value Date/Time   NA 140 05/14/2015 0801   NA 135 02/14/2015 1439   K 2.6* 05/14/2015 0801   K 4.2 02/14/2015 1439   CL 100* 02/14/2015 1439   CO2 34* 05/14/2015 0801   CO2 24 02/14/2015 1439   GLUCOSE 104 05/14/2015 0801   GLUCOSE 81 02/14/2015 1439   BUN 13.4 05/14/2015 0801   BUN <5* 02/14/2015 1439   CREATININE 0.7 05/14/2015 0801   CREATININE 0.65 02/22/2015 1731   CALCIUM 8.6 05/14/2015 0801   CALCIUM 8.5* 02/14/2015 1439   PROT 6.3* 05/14/2015 0801   PROT 7.7 02/14/2015 1439   ALBUMIN 3.0* 05/14/2015 0801   ALBUMIN 3.3* 02/14/2015 1439   AST 21 05/14/2015 0801   AST 56* 02/14/2015 1439   ALT 12 05/14/2015 0801   ALT 27 02/14/2015 1439   ALKPHOS 117 05/14/2015 0801   ALKPHOS 113 02/14/2015 1439   BILITOT 0.49 05/14/2015 0801   BILITOT 0.4 02/14/2015 1439   GFRNONAA >60 02/22/2015 1731   GFRAA >60 02/22/2015 1731    No results found for: SPEP, UPEP  Lab Results  Component Value Date   WBC 2.6* 05/14/2015   NEUTROABS 2.1 05/14/2015   HGB 10.8* 05/14/2015   HCT 31.2* 05/14/2015   MCV 88.3 05/14/2015   PLT 68* 05/14/2015       Chemistry      Component Value Date/Time   NA 140 05/14/2015 0801   NA 135 02/14/2015 1439   K 2.6* 05/14/2015 0801   K 4.2 02/14/2015 1439   CL 100* 02/14/2015 1439   CO2 34* 05/14/2015 0801   CO2 24 02/14/2015 1439  BUN 13.4 05/14/2015 0801   BUN <5* 02/14/2015 1439   CREATININE 0.7 05/14/2015 0801   CREATININE 0.65 02/22/2015 1731      Component Value Date/Time   CALCIUM 8.6 05/14/2015 0801   CALCIUM 8.5* 02/14/2015 1439   ALKPHOS 117 05/14/2015 0801   ALKPHOS 113 02/14/2015 1439   AST 21 05/14/2015 0801   AST 56* 02/14/2015 1439   ALT 12 05/14/2015 0801   ALT 27 02/14/2015 1439   BILITOT 0.49 05/14/2015 0801   BILITOT 0.4 02/14/2015 1439     ASSESSMENT & PLAN:  Carcinoma of supraglottis He tolerated treatment very well with objective response to treatment, with resolution of the lymphadenopathy. I will continue aggressive IV fluid support for him     Mucositis due to chemotherapy His pain is not well controlled. I recommend he continue on fentanyl patch. He felt that the Dilaudid is helpful. I gave him prescription Dilaudid to take before each meals and will reassess his pain visit.  Protein-calorie malnutrition, severe He has lost a lot of weight due to dysphagia. We will get him follow with speech and language therapist and follow up with nutritionist. He is currently dependent on tube feeds long-term but is able to tolerate some oral diet. He will continue close follow-up with dietitian Starting next week, I will reduce his IV fluid down to 1 L per day.      S/P gastrostomy  His feeding tube looked loose. We will adjust the feeding so that is more snug against the abdominal wall. It looks clean without signs of infection.    Hypokalemia He has severe hypokalemia related to recent chemotherapy. We will give him IV potassium replacement along with oral supplements. We will recheck his blood work tomorrow.  Pancytopenia due to antineoplastic  chemotherapy He has significant pancytopenia related to recent chemotherapy. He does not require any transfusion right now.     No orders of the defined types were placed in this encounter.   All questions were answered. The patient knows to call the clinic with any problems, questions or concerns. No barriers to learning was detected. I spent 25 minutes counseling the patient face to face. The total time spent in the appointment was 30 minutes and more than 50% was on counseling and review of test results     [facility], Dietrich, MD 05/16/2015 10:29 AM

## 2015-05-16 NOTE — Patient Instructions (Signed)
Dehydration, Adult Dehydration is when you lose more fluids from the body than you take in. Vital organs like the kidneys, brain, and heart cannot function without a proper amount of fluids and salt. Any loss of fluids from the body can cause dehydration.  CAUSES   Vomiting.  Diarrhea.  Excessive sweating.  Excessive urine output.  Fever. SYMPTOMS  Mild dehydration  Thirst.  Dry lips.  Slightly dry mouth. Moderate dehydration  Very dry mouth.  Sunken eyes.  Skin does not bounce back quickly when lightly pinched and released.  Dark urine and decreased urine production.  Decreased tear production.  Headache. Severe dehydration  Very dry mouth.  Extreme thirst.  Rapid, weak pulse (more than 100 beats per minute at rest).  Cold hands and feet.  Not able to sweat in spite of heat and temperature.  Rapid breathing.  Blue lips.  Confusion and lethargy.  Difficulty being awakened.  Minimal urine production.  No tears. DIAGNOSIS  Your caregiver will diagnose dehydration based on your symptoms and your exam. Blood and urine tests will help confirm the diagnosis. The diagnostic evaluation should also identify the cause of dehydration. TREATMENT  Treatment of mild or moderate dehydration can often be done at home by increasing the amount of fluids that you drink. It is best to drink small amounts of fluid more often. Drinking too much at one time can make vomiting worse. Refer to the home care instructions below. Severe dehydration needs to be treated at the hospital where you will probably be given intravenous (IV) fluids that contain water and electrolytes. HOME CARE INSTRUCTIONS   Ask your caregiver about specific rehydration instructions.  Drink enough fluids to keep your urine clear or pale yellow.  Drink small amounts frequently if you have nausea and vomiting.  Eat as you normally do.  Avoid:  Foods or drinks high in sugar.  Carbonated  drinks.  Juice.  Extremely hot or cold fluids.  Drinks with caffeine.  Fatty, greasy foods.  Alcohol.  Tobacco.  Overeating.  Gelatin desserts.  Wash your hands well to avoid spreading bacteria and viruses.  Only take over-the-counter or prescription medicines for pain, discomfort, or fever as directed by your caregiver.  Ask your caregiver if you should continue all prescribed and over-the-counter medicines.  Keep all follow-up appointments with your caregiver. SEEK MEDICAL CARE IF:  You have abdominal pain and it increases or stays in one area (localizes).  You have a rash, stiff neck, or severe headache.  You are irritable, sleepy, or difficult to awaken.  You are weak, dizzy, or extremely thirsty. SEEK IMMEDIATE MEDICAL CARE IF:   You are unable to keep fluids down or you get worse despite treatment.  You have frequent episodes of vomiting or diarrhea.  You have blood or green matter (bile) in your vomit.  You have blood in your stool or your stool looks black and tarry.  You have not urinated in 6 to 8 hours, or you have only urinated a small amount of very dark urine.  You have a fever.  You faint. MAKE SURE YOU:   Understand these instructions.  Will watch your condition.  Will get help right away if you are not doing well or get worse. Document Released: 08/04/2005 Document Revised: 10/27/2011 Document Reviewed: 03/24/2011 Exit[employer] Patient Information 2015 [employer]. This information is not intended to replace advice given to you by your health care provider. Make sure you discuss any questions you have with your health care   provider. Hypokalemia Hypokalemia means that the amount of potassium in the blood is lower than normal.Potassium is a chemical, called an electrolyte, that helps regulate the amount of fluid in the body. It also stimulates muscle contraction and helps nerves function properly.Most of the body's potassium is inside of  cells, and only a very small amount is in the blood. Because the amount in the blood is so small, minor changes can be life-threatening. CAUSES  Antibiotics.  Diarrhea or vomiting.  Using laxatives too much, which can cause diarrhea.  Chronic kidney disease.  Water pills (diuretics).  Eating disorders (bulimia).  Low magnesium level.  Sweating a lot. SIGNS AND SYMPTOMS  Weakness.  Constipation.  Fatigue.  Muscle cramps.  Mental confusion.  Skipped heartbeats or irregular heartbeat (palpitations).  Tingling or numbness. DIAGNOSIS  Your health care provider can diagnose hypokalemia with blood tests. In addition to checking your potassium level, your health care provider may also check other lab tests. TREATMENT Hypokalemia can be treated with potassium supplements taken by mouth or adjustments in your current medicines. If your potassium level is very low, you may need to get potassium through a vein (IV) and be monitored in the hospital. A diet high in potassium is also helpful. Foods high in potassium are:  Nuts, such as peanuts and pistachios.  Seeds, such as sunflower seeds and pumpkin seeds.  Peas, lentils, and lima beans.  Whole grain and bran cereals and breads.  Fresh fruit and vegetables, such as apricots, avocado, bananas, cantaloupe, kiwi, oranges, tomatoes, asparagus, and potatoes.  Orange and tomato juices.  Red meats.  Fruit yogurt. HOME CARE INSTRUCTIONS  Take all medicines as prescribed by your health care provider.  Maintain a healthy diet by including nutritious food, such as fruits, vegetables, nuts, whole grains, and lean meats.  If you are taking a laxative, be sure to follow the directions on the label. SEEK MEDICAL CARE IF:  Your weakness gets worse.  You feel your heart pounding or racing.  You are vomiting or having diarrhea.  You are diabetic and having trouble keeping your blood glucose in the normal range. SEEK IMMEDIATE  MEDICAL CARE IF:  You have chest pain, shortness of breath, or dizziness.  You are vomiting or having diarrhea for more than 2 days.  You faint. MAKE SURE YOU:   Understand these instructions.  Will watch your condition.  Will get help right away if you are not doing well or get worse. Document Released: 08/04/2005 Document Revised: 05/25/2013 Document Reviewed: 02/04/2013 ExitCare Patient Information 2015 [employer]. This information is not intended to replace advice given to you by your health care provider. Make sure you discuss any questions you have with your health care provider.  

## 2015-05-16 NOTE — Assessment & Plan Note (Signed)
He tolerated treatment very well with objective response to treatment, with resolution of the lymphadenopathy. I will continue aggressive IV fluid support for him

## 2015-05-16 NOTE — Telephone Encounter (Signed)
Faxed new Rx for Dilaudid and Letter from Dr. Alvy Bimler to give Dilaudid to [facility] at fax [phone].   S/w Nurse, Rollene Fare, at [facility] and informed of new orders.   Gave Rx and letter to pt to also hand carry to facility.  Instructed him to give to nurse.  He verbalized understanding.

## 2015-05-16 NOTE — Assessment & Plan Note (Signed)
He has severe hypokalemia related to recent chemotherapy. We will give him IV potassium replacement along with oral supplements. We will recheck his blood work tomorrow.

## 2015-05-16 NOTE — Assessment & Plan Note (Signed)
His pain is not well controlled. I recommend he continue on fentanyl patch. He felt that the Dilaudid is helpful. I gave him prescription Dilaudid to take before each meals and will reassess his pain visit.

## 2015-05-16 NOTE — Assessment & Plan Note (Signed)
He has lost a lot of weight due to dysphagia. We will get him follow with speech and language therapist and follow up with nutritionist. He is currently dependent on tube feeds long-term but is able to tolerate some oral diet. He will continue close follow-up with dietitian Starting next week, I will reduce his IV fluid down to 1 L per day.

## 2015-05-16 NOTE — Assessment & Plan Note (Signed)
His feeding tube looked loose. We will adjust the feeding so that is more snug against the abdominal wall. It looks clean without signs of infection.

## 2015-05-17 ENCOUNTER — Other Ambulatory Visit (HOSPITAL_BASED_OUTPATIENT_CLINIC_OR_DEPARTMENT_OTHER): Payer: Medicaid Other

## 2015-05-17 ENCOUNTER — Other Ambulatory Visit: Payer: Self-pay | Admitting: Hematology and Oncology

## 2015-05-17 ENCOUNTER — Ambulatory Visit (HOSPITAL_BASED_OUTPATIENT_CLINIC_OR_DEPARTMENT_OTHER): Payer: Medicaid Other

## 2015-05-17 VITALS — BP 117/76 | HR 65 | Temp 97.5°F | Resp 20

## 2015-05-17 DIAGNOSIS — K1231 Oral mucositis (ulcerative) due to antineoplastic therapy: Secondary | ICD-10-CM

## 2015-05-17 DIAGNOSIS — E43 Unspecified severe protein-calorie malnutrition: Secondary | ICD-10-CM

## 2015-05-17 DIAGNOSIS — D61818 Other pancytopenia: Secondary | ICD-10-CM

## 2015-05-17 DIAGNOSIS — D638 Anemia in other chronic diseases classified elsewhere: Secondary | ICD-10-CM

## 2015-05-17 DIAGNOSIS — C321 Malignant neoplasm of supraglottis: Secondary | ICD-10-CM

## 2015-05-17 DIAGNOSIS — E876 Hypokalemia: Secondary | ICD-10-CM

## 2015-05-17 LAB — COMPREHENSIVE METABOLIC PANEL (CC13)
ALT: 10 U/L (ref 0–55)
AST: 20 U/L (ref 5–34)
Albumin: 3 g/dL — ABNORMAL LOW (ref 3.5–5.0)
Alkaline Phosphatase: 120 U/L (ref 40–150)
Anion Gap: 7 mEq/L (ref 3–11)
BUN: 8.2 mg/dL (ref 7.0–26.0)
CALCIUM: 8.4 mg/dL (ref 8.4–10.4)
CHLORIDE: 100 meq/L (ref 98–109)
CO2: 31 mEq/L — ABNORMAL HIGH (ref 22–29)
Creatinine: 0.7 mg/dL (ref 0.7–1.3)
EGFR: >90 mL/min/{1.73_m2} (ref >90)
Glucose: 90 mg/dl (ref 70–140)
POTASSIUM: 3.1 meq/L — AB (ref 3.5–5.1)
Sodium: 138 mEq/L (ref 136–145)
Total Bilirubin: 0.49 mg/dL (ref 0.20–1.20)
Total Protein: 6.4 g/dL (ref 6.4–8.3)

## 2015-05-17 LAB — CBC WITH DIFFERENTIAL/PLATELET
BASO%: 0 % (ref 0.0–2.0)
Basophils Absolute: 0 10*3/uL (ref 0.0–0.1)
EOS ABS: 0 10*3/uL (ref 0.0–0.5)
EOS%: 2.5 % (ref 0.0–7.0)
HCT: 28.4 % — ABNORMAL LOW (ref 38.4–49.9)
HEMOGLOBIN: 9.8 g/dL — AB (ref 13.0–17.1)
LYMPH%: 28.6 % (ref 14.0–49.0)
MCH: 30.4 pg (ref 27.2–33.4)
MCHC: 34.5 g/dL (ref 32.0–36.0)
MCV: 88.2 fL (ref 79.3–98.0)
MONO#: 0.3 10*3/uL (ref 0.1–0.9)
MONO%: 25.2 % — ABNORMAL HIGH (ref 0.0–14.0)
NEUT%: 43.7 % (ref 39.0–75.0)
NEUTROS ABS: 0.5 10*3/uL — AB (ref 1.5–6.5)
PLATELETS: 54 10*3/uL — AB (ref 140–400)
RBC: 3.22 10*6/uL — ABNORMAL LOW (ref 4.20–5.82)
RDW: 13.9 % (ref 11.0–14.6)
WBC: 1.2 10*3/uL — AB (ref 4.0–10.3)
lymph#: 0.3 10*3/uL — ABNORMAL LOW (ref 0.9–3.3)

## 2015-05-17 LAB — MAGNESIUM (CC13): Magnesium: 1 mg/dl — CL (ref 1.5–2.5)

## 2015-05-17 MED ORDER — SODIUM CHLORIDE 0.9 % IV SOLN
2.0000 g | Freq: Once | INTRAVENOUS | Status: AC
  Administered 2015-05-17: 2 g via INTRAVENOUS
  Filled 2015-05-17: qty 4

## 2015-05-17 MED ORDER — TBO-FILGRASTIM 480 MCG/0.8ML ~~LOC~~ SOSY
480.0000 ug | PREFILLED_SYRINGE | Freq: Every day | SUBCUTANEOUS | Status: DC
  Administered 2015-05-17: 480 ug via SUBCUTANEOUS
  Filled 2015-05-17: qty 0.8

## 2015-05-17 MED ORDER — SODIUM CHLORIDE 0.9 % IV SOLN
Freq: Once | INTRAVENOUS | Status: AC
  Administered 2015-05-17: 12:00:00 via INTRAVENOUS

## 2015-05-17 MED ORDER — HYDROMORPHONE HCL 4 MG/ML IJ SOLN
INTRAMUSCULAR | Status: AC
  Filled 2015-05-17: qty 1

## 2015-05-17 MED ORDER — HYDROMORPHONE HCL 1 MG/ML IJ SOLN
2.0000 mg | Freq: Once | INTRAMUSCULAR | Status: AC
  Administered 2015-05-17: 2 mg via INTRAVENOUS
  Filled 2015-05-17: qty 2

## 2015-05-17 MED ORDER — SODIUM CHLORIDE 0.9 % IV SOLN
Freq: Once | INTRAVENOUS | Status: AC
  Administered 2015-05-17: 12:00:00 via INTRAVENOUS
  Filled 2015-05-17: qty 4

## 2015-05-17 NOTE — Patient Instructions (Signed)
Dehydration, Adult Dehydration is when you lose more fluids from the body than you take in. Vital organs like the kidneys, brain, and heart cannot function without a proper amount of fluids and salt. Any loss of fluids from the body can cause dehydration.  CAUSES   Vomiting.  Diarrhea.  Excessive sweating.  Excessive urine output.  Fever. SYMPTOMS  Mild dehydration  Thirst.  Dry lips.  Slightly dry mouth. Moderate dehydration  Very dry mouth.  Sunken eyes.  Skin does not bounce back quickly when lightly pinched and released.  Dark urine and decreased urine production.  Decreased tear production.  Headache. Severe dehydration  Very dry mouth.  Extreme thirst.  Rapid, weak pulse (more than 100 beats per minute at rest).  Cold hands and feet.  Not able to sweat in spite of heat and temperature.  Rapid breathing.  Blue lips.  Confusion and lethargy.  Difficulty being awakened.  Minimal urine production.  No tears. DIAGNOSIS  Your caregiver will diagnose dehydration based on your symptoms and your exam. Blood and urine tests will help confirm the diagnosis. The diagnostic evaluation should also identify the cause of dehydration. TREATMENT  Treatment of mild or moderate dehydration can often be done at home by increasing the amount of fluids that you drink. It is best to drink small amounts of fluid more often. Drinking too much at one time can make vomiting worse. Refer to the home care instructions below. Severe dehydration needs to be treated at the hospital where you will probably be given intravenous (IV) fluids that contain water and electrolytes. HOME CARE INSTRUCTIONS   Ask your caregiver about specific rehydration instructions.  Drink enough fluids to keep your urine clear or pale yellow.  Drink small amounts frequently if you have nausea and vomiting.  Eat as you normally do.  Avoid:  Foods or drinks high in sugar.  Carbonated  drinks.  Juice.  Extremely hot or cold fluids.  Drinks with caffeine.  Fatty, greasy foods.  Alcohol.  Tobacco.  Overeating.  Gelatin desserts.  Wash your hands well to avoid spreading bacteria and viruses.  Only take over-the-counter or prescription medicines for pain, discomfort, or fever as directed by your caregiver.  Ask your caregiver if you should continue all prescribed and over-the-counter medicines.  Keep all follow-up appointments with your caregiver. SEEK MEDICAL CARE IF:  You have abdominal pain and it increases or stays in one area (localizes).  You have a rash, stiff neck, or severe headache.  You are irritable, sleepy, or difficult to awaken.  You are weak, dizzy, or extremely thirsty. SEEK IMMEDIATE MEDICAL CARE IF:   You are unable to keep fluids down or you get worse despite treatment.  You have frequent episodes of vomiting or diarrhea.  You have blood or green matter (bile) in your vomit.  You have blood in your stool or your stool looks black and tarry.  You have not urinated in 6 to 8 hours, or you have only urinated a small amount of very dark urine.  You have a fever.  You faint. MAKE SURE YOU:   Understand these instructions.  Will watch your condition.  Will get help right away if you are not doing well or get worse. Document Released: 08/04/2005 Document Revised: 10/27/2011 Document Reviewed: 03/24/2011 Exit[employer] Patient Information 2015 [employer]. This information is not intended to replace advice given to you by your health care provider. Make sure you discuss any questions you have with your health care   provider. Hypokalemia Hypokalemia means that the amount of potassium in the blood is lower than normal.Potassium is a chemical, called an electrolyte, that helps regulate the amount of fluid in the body. It also stimulates muscle contraction and helps nerves function properly.Most of the body's potassium is inside of  cells, and only a very small amount is in the blood. Because the amount in the blood is so small, minor changes can be life-threatening. CAUSES  Antibiotics.  Diarrhea or vomiting.  Using laxatives too much, which can cause diarrhea.  Chronic kidney disease.  Water pills (diuretics).  Eating disorders (bulimia).  Low magnesium level.  Sweating a lot. SIGNS AND SYMPTOMS  Weakness.  Constipation.  Fatigue.  Muscle cramps.  Mental confusion.  Skipped heartbeats or irregular heartbeat (palpitations).  Tingling or numbness. DIAGNOSIS  Your health care provider can diagnose hypokalemia with blood tests. In addition to checking your potassium level, your health care provider may also check other lab tests. TREATMENT Hypokalemia can be treated with potassium supplements taken by mouth or adjustments in your current medicines. If your potassium level is very low, you may need to get potassium through a vein (IV) and be monitored in the hospital. A diet high in potassium is also helpful. Foods high in potassium are:  Nuts, such as peanuts and pistachios.  Seeds, such as sunflower seeds and pumpkin seeds.  Peas, lentils, and lima beans.  Whole grain and bran cereals and breads.  Fresh fruit and vegetables, such as apricots, avocado, bananas, cantaloupe, kiwi, oranges, tomatoes, asparagus, and potatoes.  Orange and tomato juices.  Red meats.  Fruit yogurt. HOME CARE INSTRUCTIONS  Take all medicines as prescribed by your health care provider.  Maintain a healthy diet by including nutritious food, such as fruits, vegetables, nuts, whole grains, and lean meats.  If you are taking a laxative, be sure to follow the directions on the label. SEEK MEDICAL CARE IF:  Your weakness gets worse.  You feel your heart pounding or racing.  You are vomiting or having diarrhea.  You are diabetic and having trouble keeping your blood glucose in the normal range. SEEK IMMEDIATE  MEDICAL CARE IF:  You have chest pain, shortness of breath, or dizziness.  You are vomiting or having diarrhea for more than 2 days.  You faint. MAKE SURE YOU:   Understand these instructions.  Will watch your condition.  Will get help right away if you are not doing well or get worse. Document Released: 08/04/2005 Document Revised: 05/25/2013 Document Reviewed: 02/04/2013 ExitCare Patient Information 2015 [employer]. This information is not intended to replace advice given to you by your health care provider. Make sure you discuss any questions you have with your health care provider.  

## 2015-05-18 ENCOUNTER — Ambulatory Visit (HOSPITAL_BASED_OUTPATIENT_CLINIC_OR_DEPARTMENT_OTHER): Payer: Medicaid Other

## 2015-05-18 VITALS — BP 101/64 | HR 67 | Temp 98.7°F | Resp 17 | Ht 73.0 in

## 2015-05-18 DIAGNOSIS — C321 Malignant neoplasm of supraglottis: Secondary | ICD-10-CM

## 2015-05-18 DIAGNOSIS — K1231 Oral mucositis (ulcerative) due to antineoplastic therapy: Secondary | ICD-10-CM | POA: Diagnosis not present

## 2015-05-18 DIAGNOSIS — E43 Unspecified severe protein-calorie malnutrition: Secondary | ICD-10-CM

## 2015-05-18 MED ORDER — HEPARIN SOD (PORK) LOCK FLUSH 100 UNIT/ML IV SOLN
500.0000 [IU] | Freq: Once | INTRAVENOUS | Status: AC | PRN
  Administered 2015-05-18: 500 [IU]
  Filled 2015-05-18: qty 5

## 2015-05-18 MED ORDER — SODIUM CHLORIDE 0.9 % IJ SOLN
10.0000 mL | INTRAMUSCULAR | Status: DC | PRN
  Administered 2015-05-18: 10 mL
  Filled 2015-05-18: qty 10

## 2015-05-18 MED ORDER — HYDROMORPHONE HCL 4 MG/ML IJ SOLN
2.0000 mg | INTRAMUSCULAR | Status: DC | PRN
  Administered 2015-05-18: 2 mg via INTRAVENOUS

## 2015-05-18 MED ORDER — SODIUM CHLORIDE 0.9 % IV SOLN
Freq: Once | INTRAVENOUS | Status: AC
  Administered 2015-05-18: 09:00:00 via INTRAVENOUS

## 2015-05-18 MED ORDER — ONDANSETRON HCL 40 MG/20ML IJ SOLN
Freq: Once | INTRAMUSCULAR | Status: AC
  Administered 2015-05-18: 09:00:00 via INTRAVENOUS
  Filled 2015-05-18: qty 4

## 2015-05-18 MED ORDER — HYDROMORPHONE HCL 4 MG/ML IJ SOLN
INTRAMUSCULAR | Status: AC
  Filled 2015-05-18: qty 1

## 2015-05-18 NOTE — Patient Instructions (Signed)

## 2015-05-20 NOTE — Progress Notes (Addendum)
  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   He required recurrent IV fluids from medical oncology.  Plan: The patient has completed radiation treatment. The patient will return to radiation oncology clinic for routine followup in one month. I advised the patient to call or return sooner if any questions or concerns arise that are related to recovery or treatment.  -----------------------------------  [name], MD

## 2015-05-21 ENCOUNTER — Other Ambulatory Visit: Payer: Self-pay | Admitting: Hematology and Oncology

## 2015-05-21 ENCOUNTER — Ambulatory Visit (HOSPITAL_BASED_OUTPATIENT_CLINIC_OR_DEPARTMENT_OTHER): Payer: Medicaid Other

## 2015-05-21 VITALS — BP 120/85 | HR 70 | Temp 98.1°F | Resp 18

## 2015-05-21 DIAGNOSIS — C321 Malignant neoplasm of supraglottis: Secondary | ICD-10-CM | POA: Diagnosis not present

## 2015-05-21 DIAGNOSIS — E43 Unspecified severe protein-calorie malnutrition: Secondary | ICD-10-CM | POA: Diagnosis present

## 2015-05-21 DIAGNOSIS — K1231 Oral mucositis (ulcerative) due to antineoplastic therapy: Secondary | ICD-10-CM | POA: Diagnosis not present

## 2015-05-21 MED ORDER — HYDROMORPHONE HCL 4 MG/ML IJ SOLN
2.0000 mg | INTRAMUSCULAR | Status: DC | PRN
  Administered 2015-05-21: 2 mg via INTRAVENOUS

## 2015-05-21 MED ORDER — HYDROMORPHONE HCL 4 MG/ML IJ SOLN
2.0000 mg | Freq: Once | INTRAMUSCULAR | Status: AC
  Administered 2015-05-21: 2 mg via INTRAVENOUS

## 2015-05-21 MED ORDER — HYDROMORPHONE HCL 4 MG/ML IJ SOLN
INTRAMUSCULAR | Status: AC
  Filled 2015-05-21: qty 1

## 2015-05-21 MED ORDER — SODIUM CHLORIDE 0.9 % IV SOLN
Freq: Once | INTRAVENOUS | Status: AC
  Administered 2015-05-21: 09:00:00 via INTRAVENOUS
  Filled 2015-05-21: qty 4

## 2015-05-21 MED ORDER — SODIUM CHLORIDE 0.9 % IV SOLN
Freq: Once | INTRAVENOUS | Status: AC
  Administered 2015-05-21: 08:00:00 via INTRAVENOUS

## 2015-05-21 MED ORDER — SODIUM CHLORIDE 0.9 % IJ SOLN
10.0000 mL | INTRAMUSCULAR | Status: DC | PRN
  Administered 2015-05-21: 10 mL
  Filled 2015-05-21: qty 10

## 2015-05-21 MED ORDER — HEPARIN SOD (PORK) LOCK FLUSH 100 UNIT/ML IV SOLN
500.0000 [IU] | Freq: Once | INTRAVENOUS | Status: AC | PRN
  Administered 2015-05-21: 500 [IU]
  Filled 2015-05-21: qty 5

## 2015-05-21 MED ORDER — SODIUM CHLORIDE 0.9 % IV SOLN
Freq: Once | INTRAVENOUS | Status: AC
  Administered 2015-05-21: 09:00:00 via INTRAVENOUS
  Filled 2015-05-21: qty 500

## 2015-05-21 NOTE — Patient Instructions (Signed)
Dehydration, Adult Dehydration is when you lose more fluids from the body than you take in. Vital organs like the kidneys, brain, and heart cannot function without a proper amount of fluids and salt. Any loss of fluids from the body can cause dehydration.  CAUSES   Vomiting.  Diarrhea.  Excessive sweating.  Excessive urine output.  Fever. SYMPTOMS  Mild dehydration  Thirst.  Dry lips.  Slightly dry mouth. Moderate dehydration  Very dry mouth.  Sunken eyes.  Skin does not bounce back quickly when lightly pinched and released.  Dark urine and decreased urine production.  Decreased tear production.  Headache. Severe dehydration  Very dry mouth.  Extreme thirst.  Rapid, weak pulse (more than 100 beats per minute at rest).  Cold hands and feet.  Not able to sweat in spite of heat and temperature.  Rapid breathing.  Blue lips.  Confusion and lethargy.  Difficulty being awakened.  Minimal urine production.  No tears. DIAGNOSIS  Your caregiver will diagnose dehydration based on your symptoms and your exam. Blood and urine tests will help confirm the diagnosis. The diagnostic evaluation should also identify the cause of dehydration. TREATMENT  Treatment of mild or moderate dehydration can often be done at home by increasing the amount of fluids that you drink. It is best to drink small amounts of fluid more often. Drinking too much at one time can make vomiting worse. Refer to the home care instructions below. Severe dehydration needs to be treated at the hospital where you will probably be given intravenous (IV) fluids that contain water and electrolytes. HOME CARE INSTRUCTIONS   Ask your caregiver about specific rehydration instructions.  Drink enough fluids to keep your urine clear or pale yellow.  Drink small amounts frequently if you have nausea and vomiting.  Eat as you normally do.  Avoid:  Foods or drinks high in sugar.  Carbonated  drinks.  Juice.  Extremely hot or cold fluids.  Drinks with caffeine.  Fatty, greasy foods.  Alcohol.  Tobacco.  Overeating.  Gelatin desserts.  Wash your hands well to avoid spreading bacteria and viruses.  Only take over-the-counter or prescription medicines for pain, discomfort, or fever as directed by your caregiver.  Ask your caregiver if you should continue all prescribed and over-the-counter medicines.  Keep all follow-up appointments with your caregiver. SEEK MEDICAL CARE IF:  You have abdominal pain and it increases or stays in one area (localizes).  You have a rash, stiff neck, or severe headache.  You are irritable, sleepy, or difficult to awaken.  You are weak, dizzy, or extremely thirsty. SEEK IMMEDIATE MEDICAL CARE IF:   You are unable to keep fluids down or you get worse despite treatment.  You have frequent episodes of vomiting or diarrhea.  You have blood or green matter (bile) in your vomit.  You have blood in your stool or your stool looks black and tarry.  You have not urinated in 6 to 8 hours, or you have only urinated a small amount of very dark urine.  You have a fever.  You faint. MAKE SURE YOU:   Understand these instructions.  Will watch your condition.  Will get help right away if you are not doing well or get worse. Document Released: 08/04/2005 Document Revised: 10/27/2011 Document Reviewed: 03/24/2011 ExitCare Patient Information 2015 [employer]. This information is not intended to replace advice given to you by your health care provider. Make sure you discuss any questions you have with your health care   provider.  

## 2015-05-22 ENCOUNTER — Other Ambulatory Visit (HOSPITAL_BASED_OUTPATIENT_CLINIC_OR_DEPARTMENT_OTHER): Payer: Medicaid Other

## 2015-05-22 ENCOUNTER — Ambulatory Visit (HOSPITAL_BASED_OUTPATIENT_CLINIC_OR_DEPARTMENT_OTHER): Payer: Medicaid Other

## 2015-05-22 ENCOUNTER — Other Ambulatory Visit: Payer: Self-pay | Admitting: Hematology and Oncology

## 2015-05-22 ENCOUNTER — Telehealth: Payer: Self-pay

## 2015-05-22 VITALS — BP 109/76 | HR 73 | Temp 98.7°F | Resp 17 | Ht 73.0 in

## 2015-05-22 DIAGNOSIS — D61810 Antineoplastic chemotherapy induced pancytopenia: Secondary | ICD-10-CM

## 2015-05-22 DIAGNOSIS — E46 Unspecified protein-calorie malnutrition: Secondary | ICD-10-CM

## 2015-05-22 DIAGNOSIS — C321 Malignant neoplasm of supraglottis: Secondary | ICD-10-CM

## 2015-05-22 DIAGNOSIS — K1231 Oral mucositis (ulcerative) due to antineoplastic therapy: Secondary | ICD-10-CM | POA: Diagnosis not present

## 2015-05-22 DIAGNOSIS — E43 Unspecified severe protein-calorie malnutrition: Secondary | ICD-10-CM | POA: Diagnosis present

## 2015-05-22 DIAGNOSIS — E876 Hypokalemia: Secondary | ICD-10-CM

## 2015-05-22 LAB — CBC WITH DIFFERENTIAL/PLATELET
BASO%: 0.3 % (ref 0.0–2.0)
BASOS ABS: 0 10*3/uL (ref 0.0–0.1)
EOS ABS: 0 10*3/uL (ref 0.0–0.5)
EOS%: 1.2 % (ref 0.0–7.0)
HCT: 27.9 % — ABNORMAL LOW (ref 38.4–49.9)
HEMOGLOBIN: 9.5 g/dL — AB (ref 13.0–17.1)
LYMPH%: 24.1 % (ref 14.0–49.0)
MCH: 30.5 pg (ref 27.2–33.4)
MCHC: 34.1 g/dL (ref 32.0–36.0)
MCV: 89.6 fL (ref 79.3–98.0)
MONO#: 0.7 10*3/uL (ref 0.1–0.9)
MONO%: 27.7 % — AB (ref 0.0–14.0)
NEUT#: 1.2 10*3/uL — ABNORMAL LOW (ref 1.5–6.5)
NEUT%: 46.7 % (ref 39.0–75.0)
Platelets: 181 10*3/uL (ref 140–400)
RBC: 3.12 10*6/uL — ABNORMAL LOW (ref 4.20–5.82)
RDW: 15 % — ABNORMAL HIGH (ref 11.0–14.6)
WBC: 2.6 10*3/uL — ABNORMAL LOW (ref 4.0–10.3)
lymph#: 0.6 10*3/uL — ABNORMAL LOW (ref 0.9–3.3)

## 2015-05-22 LAB — COMPREHENSIVE METABOLIC PANEL (CC13)
ALT: <9 U/L (ref 0–55)
ANION GAP: 9 meq/L (ref 3–11)
AST: 16 U/L (ref 5–34)
Albumin: 2.9 g/dL — ABNORMAL LOW (ref 3.5–5.0)
Alkaline Phosphatase: 135 U/L (ref 40–150)
BILIRUBIN TOTAL: 0.3 mg/dL (ref 0.20–1.20)
BUN: 6.2 mg/dL — ABNORMAL LOW (ref 7.0–26.0)
CHLORIDE: 101 meq/L (ref 98–109)
CO2: 30 meq/L — AB (ref 22–29)
Calcium: 8.7 mg/dL (ref 8.4–10.4)
Creatinine: 0.6 mg/dL — ABNORMAL LOW (ref 0.7–1.3)
GLUCOSE: 118 mg/dL (ref 70–140)
POTASSIUM: 2.7 meq/L — AB (ref 3.5–5.1)
SODIUM: 140 meq/L (ref 136–145)
TOTAL PROTEIN: 6.4 g/dL (ref 6.4–8.3)

## 2015-05-22 LAB — MAGNESIUM (CC13): MAGNESIUM: 1.3 mg/dL — AB (ref 1.5–2.5)

## 2015-05-22 MED ORDER — SODIUM CHLORIDE 0.9 % IV SOLN
1000.0000 mL | Freq: Once | INTRAVENOUS | Status: AC
  Administered 2015-05-22: 1000 mL via INTRAVENOUS

## 2015-05-22 MED ORDER — HYDROMORPHONE HCL 4 MG/ML IJ SOLN
INTRAMUSCULAR | Status: AC
  Filled 2015-05-22: qty 1

## 2015-05-22 MED ORDER — HEPARIN SOD (PORK) LOCK FLUSH 100 UNIT/ML IV SOLN
250.0000 [IU] | Freq: Once | INTRAVENOUS | Status: DC | PRN
  Filled 2015-05-22: qty 5

## 2015-05-22 MED ORDER — ONDANSETRON HCL 40 MG/20ML IJ SOLN
Freq: Once | INTRAMUSCULAR | Status: AC
  Administered 2015-05-22: 14:00:00 via INTRAVENOUS
  Filled 2015-05-22: qty 4

## 2015-05-22 MED ORDER — HEPARIN SOD (PORK) LOCK FLUSH 100 UNIT/ML IV SOLN
500.0000 [IU] | Freq: Once | INTRAVENOUS | Status: AC | PRN
  Administered 2015-05-22: 500 [IU]
  Filled 2015-05-22: qty 5

## 2015-05-22 MED ORDER — SODIUM CHLORIDE 0.9 % IV SOLN
Freq: Once | INTRAVENOUS | Status: AC
  Administered 2015-05-22: 15:00:00 via INTRAVENOUS
  Filled 2015-05-22: qty 1000

## 2015-05-22 MED ORDER — HYDROMORPHONE HCL 4 MG/ML IJ SOLN
2.0000 mg | INTRAMUSCULAR | Status: DC | PRN
  Administered 2015-05-22 (×2): 2 mg via INTRAVENOUS

## 2015-05-22 MED ORDER — ALTEPLASE 2 MG IJ SOLR
2.0000 mg | Freq: Once | INTRAMUSCULAR | Status: DC | PRN
  Filled 2015-05-22: qty 2

## 2015-05-22 MED ORDER — SODIUM CHLORIDE 0.9 % IJ SOLN
10.0000 mL | INTRAMUSCULAR | Status: DC | PRN
  Administered 2015-05-22: 10 mL
  Filled 2015-05-22: qty 10

## 2015-05-22 MED ORDER — HYDROMORPHONE HCL 4 MG/ML IJ SOLN: 2.0000 mg | INTRAMUSCULAR | Status: DC | PRN

## 2015-05-22 NOTE — Telephone Encounter (Signed)
Spoke with pt in chemo room after potassium level came back 2.7 to verify if patient is getting his potassium via peg tube per Dr Alvy Bimler.  Patient states he has not been receiving his medication.  [employer] nurse line and spoke with Abran Cantor who took a verbal order for potassium chloride 73meq/15mL TID via peg tub.  Nurse aware of Potassium level. Fax number for [facility] is [phone] but they do prefer verbal orders. Dr Alvy Bimler also added Magnesium level to labs today and lab was called and made aware.  Patient also aware an order was called in for potassium.

## 2015-05-23 ENCOUNTER — Ambulatory Visit (HOSPITAL_BASED_OUTPATIENT_CLINIC_OR_DEPARTMENT_OTHER): Payer: Medicaid Other

## 2015-05-23 ENCOUNTER — Other Ambulatory Visit: Payer: Self-pay

## 2015-05-23 VITALS — BP 118/82 | HR 66 | Temp 99.0°F | Resp 20

## 2015-05-23 DIAGNOSIS — E876 Hypokalemia: Secondary | ICD-10-CM

## 2015-05-23 DIAGNOSIS — E43 Unspecified severe protein-calorie malnutrition: Secondary | ICD-10-CM

## 2015-05-23 DIAGNOSIS — C321 Malignant neoplasm of supraglottis: Secondary | ICD-10-CM

## 2015-05-23 DIAGNOSIS — K1231 Oral mucositis (ulcerative) due to antineoplastic therapy: Secondary | ICD-10-CM

## 2015-05-23 MED ORDER — HEPARIN SOD (PORK) LOCK FLUSH 100 UNIT/ML IV SOLN
500.0000 [IU] | Freq: Once | INTRAVENOUS | Status: AC | PRN
  Administered 2015-05-23: 500 [IU]
  Filled 2015-05-23: qty 5

## 2015-05-23 MED ORDER — HYDROMORPHONE HCL 4 MG/ML IJ SOLN
INTRAMUSCULAR | Status: AC
  Filled 2015-05-23: qty 1

## 2015-05-23 MED ORDER — SODIUM CHLORIDE 0.9 % IJ SOLN
10.0000 mL | INTRAMUSCULAR | Status: DC | PRN
  Administered 2015-05-23: 10 mL
  Filled 2015-05-23: qty 10

## 2015-05-23 MED ORDER — SODIUM CHLORIDE 0.9 % IV SOLN
Freq: Once | INTRAVENOUS | Status: AC
  Administered 2015-05-23: 15:00:00 via INTRAVENOUS
  Filled 2015-05-23: qty 1000

## 2015-05-23 MED ORDER — HYDROMORPHONE HCL 4 MG/ML IJ SOLN
2.0000 mg | INTRAMUSCULAR | Status: DC | PRN
Start: 2015-05-23 — End: 2015-05-23
  Administered 2015-05-23 (×2): 2 mg via INTRAVENOUS

## 2015-05-23 MED ORDER — HYDROMORPHONE HCL 1 MG/ML IJ SOLN
2.0000 mg | Freq: Once | INTRAMUSCULAR | Status: AC
  Administered 2015-05-23: 2 mg via INTRAVENOUS
  Filled 2015-05-23: qty 2

## 2015-05-23 MED ORDER — SODIUM CHLORIDE 0.9 % IV SOLN
Freq: Once | INTRAVENOUS | Status: AC
  Administered 2015-05-23: 15:00:00 via INTRAVENOUS
  Filled 2015-05-23: qty 4

## 2015-05-23 NOTE — Patient Instructions (Signed)

## 2015-05-24 ENCOUNTER — Ambulatory Visit (HOSPITAL_BASED_OUTPATIENT_CLINIC_OR_DEPARTMENT_OTHER): Payer: Medicaid Other

## 2015-05-24 ENCOUNTER — Encounter: Payer: Self-pay | Admitting: *Deleted

## 2015-05-24 ENCOUNTER — Encounter: Payer: Self-pay | Admitting: Hematology and Oncology

## 2015-05-24 ENCOUNTER — Telehealth: Payer: Self-pay | Admitting: Hematology and Oncology

## 2015-05-24 ENCOUNTER — Ambulatory Visit (HOSPITAL_BASED_OUTPATIENT_CLINIC_OR_DEPARTMENT_OTHER): Payer: Medicaid Other | Admitting: Hematology and Oncology

## 2015-05-24 ENCOUNTER — Ambulatory Visit: Payer: Self-pay

## 2015-05-24 VITALS — BP 125/84 | HR 74 | Temp 98.2°F | Resp 19 | Ht 73.0 in | Wt 135.2 lb

## 2015-05-24 DIAGNOSIS — E43 Unspecified severe protein-calorie malnutrition: Secondary | ICD-10-CM

## 2015-05-24 DIAGNOSIS — C321 Malignant neoplasm of supraglottis: Secondary | ICD-10-CM

## 2015-05-24 DIAGNOSIS — Z931 Gastrostomy status: Secondary | ICD-10-CM

## 2015-05-24 DIAGNOSIS — T451X5A Adverse effect of antineoplastic and immunosuppressive drugs, initial encounter: Secondary | ICD-10-CM

## 2015-05-24 DIAGNOSIS — E876 Hypokalemia: Secondary | ICD-10-CM

## 2015-05-24 DIAGNOSIS — D61810 Antineoplastic chemotherapy induced pancytopenia: Secondary | ICD-10-CM | POA: Diagnosis not present

## 2015-05-24 MED ORDER — HYDROMORPHONE HCL 4 MG/ML IJ SOLN
INTRAMUSCULAR | Status: AC
  Filled 2015-05-24: qty 1

## 2015-05-24 MED ORDER — SODIUM CHLORIDE 0.9 % IJ SOLN
10.0000 mL | INTRAMUSCULAR | Status: DC | PRN
  Administered 2015-05-24: 10 mL
  Filled 2015-05-24: qty 10

## 2015-05-24 MED ORDER — SODIUM CHLORIDE 0.9 % IV SOLN
INTRAVENOUS | Status: DC
  Administered 2015-05-24: 13:00:00 via INTRAVENOUS

## 2015-05-24 MED ORDER — SODIUM CHLORIDE 0.9 % IV SOLN
Freq: Once | INTRAVENOUS | Status: AC
  Administered 2015-05-24: 14:00:00 via INTRAVENOUS
  Filled 2015-05-24: qty 1000

## 2015-05-24 MED ORDER — SODIUM CHLORIDE 0.9 % IV SOLN
Freq: Once | INTRAVENOUS | Status: AC
  Administered 2015-05-24: 14:00:00 via INTRAVENOUS
  Filled 2015-05-24: qty 4

## 2015-05-24 MED ORDER — HEPARIN SOD (PORK) LOCK FLUSH 100 UNIT/ML IV SOLN
500.0000 [IU] | Freq: Once | INTRAVENOUS | Status: AC | PRN
  Administered 2015-05-24: 500 [IU]
  Filled 2015-05-24: qty 5

## 2015-05-24 MED ORDER — HYDROMORPHONE HCL 4 MG/ML IJ SOLN
2.0000 mg | INTRAMUSCULAR | Status: DC | PRN
  Administered 2015-05-24: 2 mg via INTRAVENOUS

## 2015-05-24 NOTE — Telephone Encounter (Signed)
Gave patient avs report for October.

## 2015-05-24 NOTE — Patient Instructions (Signed)

## 2015-05-24 NOTE — Progress Notes (Addendum)
Pain issues, if any: He complains of pain in his throat and rates it a level 5. He has 28mcg fentanyl patch on currently and states the 4 mg po dilaudid really helps if he takes it 30 minutes before eating along with the lidocaine rinse Using a feeding tube?: yes, Jevity 3-4 cans a day Weight changes, if any:  Wt Readings from Last 3 Encounters:  05/25/15 139 lb 14.4 oz (63.458 kg)  05/24/15 135 lb 3 oz (61.321 kg)  05/08/15 131 lb (59.421 kg)   Swallowing issues, if any: Patient is able to eat soft foods for example potatoes, or "grinded down foods". Smoking or chewing tobacco? No Using fluoride trays daily? He states he uses the toothpaste every night. Last ENT visit was on: He states it was 2 months ago, while he was in the hospital having surgery. Other notable issues, if any: Patient does have a trach, he says he is managing it well. He does not need to suction, and is able to manage his secretions on his own. He also reports his energy level is "pretty good".    BP 109/72 mmHg  Pulse 100  Temp(Src) 97.8 F (36.6 C)  Ht 6\' 1"  (1.854 m)  Wt 139 lb 14.4 oz (63.458 kg)  BMI 18.46 kg/m2  SpO2 95%

## 2015-05-25 ENCOUNTER — Ambulatory Visit
Admission: RE | Admit: 2015-05-25 | Discharge: 2015-05-25 | Disposition: A | Discharge status: Home / Self Care | Payer: Medicaid Other | Source: Ambulatory Visit | Attending: Radiation Oncology | Admitting: Radiation Oncology

## 2015-05-25 ENCOUNTER — Encounter: Payer: Self-pay | Admitting: Radiation Oncology

## 2015-05-25 ENCOUNTER — Other Ambulatory Visit (HOSPITAL_BASED_OUTPATIENT_CLINIC_OR_DEPARTMENT_OTHER): Payer: Medicaid Other

## 2015-05-25 ENCOUNTER — Ambulatory Visit (HOSPITAL_BASED_OUTPATIENT_CLINIC_OR_DEPARTMENT_OTHER): Payer: Medicaid Other

## 2015-05-25 VITALS — BP 109/72 | HR 100 | Temp 97.8°F | Ht 73.0 in | Wt 139.9 lb

## 2015-05-25 VITALS — BP 106/75 | HR 86 | Temp 99.0°F | Resp 17

## 2015-05-25 DIAGNOSIS — E43 Unspecified severe protein-calorie malnutrition: Secondary | ICD-10-CM

## 2015-05-25 DIAGNOSIS — R634 Abnormal weight loss: Secondary | ICD-10-CM

## 2015-05-25 DIAGNOSIS — E876 Hypokalemia: Secondary | ICD-10-CM | POA: Diagnosis not present

## 2015-05-25 DIAGNOSIS — C321 Malignant neoplasm of supraglottis: Secondary | ICD-10-CM

## 2015-05-25 LAB — CBC WITH DIFFERENTIAL/PLATELET
BASO%: 0.3 % (ref 0.0–2.0)
BASOS ABS: 0 10*3/uL (ref 0.0–0.1)
EOS%: 1.2 % (ref 0.0–7.0)
Eosinophils Absolute: 0.1 10*3/uL (ref 0.0–0.5)
HCT: 29.3 % — ABNORMAL LOW (ref 38.4–49.9)
HGB: 10 g/dL — ABNORMAL LOW (ref 13.0–17.1)
LYMPH%: 13.8 % — AB (ref 14.0–49.0)
MCH: 30.9 pg (ref 27.2–33.4)
MCHC: 34.2 g/dL (ref 32.0–36.0)
MCV: 90.2 fL (ref 79.3–98.0)
MONO#: 0.7 10*3/uL (ref 0.1–0.9)
MONO%: 14.6 % — AB (ref 0.0–14.0)
NEUT#: 3.3 10*3/uL (ref 1.5–6.5)
NEUT%: 70.1 % (ref 39.0–75.0)
Platelets: 267 10*3/uL (ref 140–400)
RBC: 3.25 10*6/uL — ABNORMAL LOW (ref 4.20–5.82)
RDW: 15.2 % — ABNORMAL HIGH (ref 11.0–14.6)
WBC: 4.7 10*3/uL (ref 4.0–10.3)
lymph#: 0.7 10*3/uL — ABNORMAL LOW (ref 0.9–3.3)

## 2015-05-25 LAB — COMPREHENSIVE METABOLIC PANEL (CC13)
AST: 14 U/L (ref 5–34)
Albumin: 3 g/dL — ABNORMAL LOW (ref 3.5–5.0)
Alkaline Phosphatase: 143 U/L (ref 40–150)
Anion Gap: 8 mEq/L (ref 3–11)
BUN: 4.4 mg/dL — ABNORMAL LOW (ref 7.0–26.0)
CO2: 29 mEq/L (ref 22–29)
CREATININE: 0.6 mg/dL — AB (ref 0.7–1.3)
Calcium: 9.1 mg/dL (ref 8.4–10.4)
Chloride: 103 mEq/L (ref 98–109)
EGFR: >90 mL/min/{1.73_m2} (ref >90)
GLUCOSE: 90 mg/dL (ref 70–140)
Potassium: 4.4 mEq/L (ref 3.5–5.1)
SODIUM: 140 meq/L (ref 136–145)
TOTAL PROTEIN: 6.6 g/dL (ref 6.4–8.3)

## 2015-05-25 LAB — MAGNESIUM (CC13): MAGNESIUM: 1.5 mg/dL (ref 1.5–2.5)

## 2015-05-25 MED ORDER — HYDROMORPHONE HCL 4 MG/ML IJ SOLN
INTRAMUSCULAR | Status: AC
Start: 2015-05-25 — End: 2015-05-25
  Filled 2015-05-25: qty 1

## 2015-05-25 MED ORDER — SODIUM CHLORIDE 0.9 % IV SOLN
Freq: Once | INTRAVENOUS | Status: AC
  Administered 2015-05-25: 09:00:00 via INTRAVENOUS
  Filled 2015-05-25: qty 4

## 2015-05-25 MED ORDER — HEPARIN SOD (PORK) LOCK FLUSH 100 UNIT/ML IV SOLN
500.0000 [IU] | Freq: Once | INTRAVENOUS | Status: AC | PRN
  Administered 2015-05-25: 500 [IU]
  Filled 2015-05-25: qty 5

## 2015-05-25 MED ORDER — SODIUM CHLORIDE 0.9 % IV SOLN
Freq: Once | INTRAVENOUS | Status: AC
  Administered 2015-05-25: 10:00:00 via INTRAVENOUS
  Filled 2015-05-25: qty 1000

## 2015-05-25 MED ORDER — HYDROMORPHONE HCL 1 MG/ML IJ SOLN
2.0000 mg | INTRAMUSCULAR | Status: DC | PRN
  Administered 2015-05-25: 2 mg via INTRAVENOUS
  Filled 2015-05-25: qty 2

## 2015-05-25 MED ORDER — SODIUM CHLORIDE 0.9 % IJ SOLN
10.0000 mL | INTRAMUSCULAR | Status: DC | PRN
  Administered 2015-05-25: 10 mL
  Filled 2015-05-25: qty 10

## 2015-05-25 NOTE — Progress Notes (Signed)
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[place]) 161.1 C32.1    Indication for treatment: curative with chemotherapy Radiation treatment dates:    03/21/2015-05/09/2015 Site/dose:     Supraglottic tumor and bilateral neck / 70 Gy in 35 fractions to gross disease, 63 Gy in 35 fractions to high risk nodal echelons, and 56 Gy in 35 fractions to intermediate risk nodal echelons Beams/energy:   Helical IMRT / 6 MV photons  Narrative:  The patient returns today for routine follow-up.    Pain issues, if any: He complains of pain in his throat and rates it a level 5. He has 72mcg fentanyl patch on currently and states the 4 mg po dilaudid really helps if he takes it 30 minutes before eating along with the lidocaine rinse Using a feeding tube?: yes, Jevity 3-4 cans a day Swallowing issues, if any: Patient is able to eat soft foods for example potatoes, or "grinded down foods". Smoking or chewing tobacco? No Using fluoride trays daily? He states he uses the toothpaste every night. Last ENT visit was on: He states it was 2 months ago, while he was in the hospital having surgery. Other notable issues, if any: Patient does have a trach, he says he is managing it well. He does not need to suction, and is able to manage his secretions on his own. He also reports his energy level is "pretty good"  He is being followed closely by medical oncology and was treated with supplemental IV fluids today.                              ALLERGIES:  has No Known Allergies.  Meds: Current Outpatient Prescriptions  Medication Sig Dispense Refill  . bisacodyl (DULCOLAX) 10 MG suppository Place 10 mg rectally  as needed for moderate constipation.    . butalbital-acetaminophen-caffeine (FIORICET) 50-325-40 MG per tablet Take 1 tablet by mouth every 4 (four) hours as needed for headache. 60 tablet 0  . citalopram (CELEXA) 20 MG tablet 20 mg by PEG Tube route daily.    . fentaNYL (DURAGESIC - DOSED MCG/HR) 50 MCG/HR Place 1 patch (50 mcg total) onto the skin every 3 (three) days. 5 patch 0  . folic acid (FOLVITE) 1 MG tablet Take 1 tablet (1 mg total) by mouth daily. (Patient taking differently: 1 mg by PEG Tube route daily. ) 30 tablet 0  . HYDROcodone-acetaminophen (HYCET) 7.5-325 mg/15 ml solution Take 15 mLs by mouth 4 (four) times daily as needed for moderate pain. 120 mL 0  . HYDROmorphone (DILAUDID) 4 MG tablet Take 1 tablets 30 minutes before meals, up to maximum 6 tablets per day if needed 60 tablet 0  . ibuprofen (ADVIL,MOTRIN) 100 MG/5ML suspension Take 200-400 mg by mouth every 4 (four) hours as needed for mild pain.     Marland Kitchen lidocaine (XYLOCAINE) 2 % solution Mix 1 part 2%viscous lidocaine,1part H2O.Swish and/or swallow 17ml of this mixture,47min before meals and at bedtime, up to QID (Patient taking differently: Mix 1 part 2%viscous lidocaine,1part H2O.Swish and/or swallow 77ml of this mixture,52min before meals and at bedtime, up to  QID as needed for sore throat) 100 mL 5  . LORazepam (ATIVAN) 1 MG tablet Take 0.5 mg by mouth 3 (three) times daily.     . magnesium hydroxide (MILK OF MAGNESIA) 400 MG/5ML suspension Take 30 mLs by mouth daily as needed for mild constipation.    Marland Kitchen morphine (ROXANOL) 20 MG/ML concentrated solution Take 1 mL (20 mg total) by mouth every 2 (two) hours as needed for moderate pain or severe pain. 240 mL 0  . Multiple Vitamin (MULTIVITAMIN WITH MINERALS) TABS tablet Take 1 tablet by mouth daily. (Patient taking differently: 1 tablet by PEG Tube route daily. ) 30 tablet 0  . Nutritional Supplements (FEEDING SUPPLEMENT, JEVITY 1.2 CAL,) LIQD Give 1 can Jevity 1.2 via PEG TID  between meals with 125 ml free water before and after bolus feeding.  In addition, give 250 cc free water TID with meals. 711 mL 0  . omeprazole (PRILOSEC) 20 MG capsule 20 mg by PEG Tube route daily.    . ondansetron (ZOFRAN) 8 MG tablet 8 mg by PEG Tube route every 8 (eight) hours as needed for nausea or vomiting.    . potassium chloride 20 MEQ/15ML (10%) SOLN Place 15 mLs (20 mEq total) into feeding tube 3 (three) times daily. 473 mL 0  . pravastatin (PRAVACHOL) 20 MG tablet 20 mg by PEG Tube route daily.    . prochlorperazine (COMPAZINE) 10 MG tablet 10 mg by PEG Tube route every 6 (six) hours as needed for nausea or vomiting.    . sodium fluoride (PREVIDENT 5000 PLUS) 1.1 % CREA dental cream Apply thin ribbon of cream to tooth brush. Brush teeth for 2 minutes. Spit out excess-DO NOT swallow. DO NOT rinse afterwards. Repeat nightly. 1 Tube prn  . sodium phosphate (FLEET) enema Place 1 enema rectally once as needed (for constipation). follow package directions    . sucralfate (CARAFATE) 1 G tablet Dissolve 1 tablet in 69ml H2O and swallow up to QID,PRN sore throat. 60 tablet 5  . thiamine 100 MG tablet Take 1 tablet (100 mg total) by mouth daily. (Patient taking differently: 100 mg by PEG Tube route daily. ) 30 tablet 0  . feeding supplement, ENSURE, (ENSURE) PUDG Take 1 Container by mouth 3 (three) times daily between meals.    . lidocaine-prilocaine (EMLA) cream Apply 1 application topically as needed (FOR PORT ACCESS).    . nicotine (NICODERM CQ - DOSED IN MG/24 HOURS) 21 mg/24hr patch Place 1 patch (21 mg total) onto the skin daily. (Patient not taking: Reported on 05/25/2015) 28 patch 0   No current facility-administered medications for this encounter.   Facility-Administered Medications Ordered in Other Encounters  Medication Dose Route Frequency Provider Last Rate Last Dose  . HYDROmorphone (DILAUDID) injection 2 mg  2 mg Intravenous Q2H PRN [name], MD   2 mg at 05/10/15 1043  .  HYDROmorphone (DILAUDID) injection 2 mg  2 mg Intravenous Q2H PRN [name], MD   2 mg at 05/25/15 0852  . ondansetron (ZOFRAN) 8 mg in sodium chloride 0.9 % 50 mL IVPB   Intravenous Once [name], MD      . ondansetron (ZOFRAN) 8 mg in sodium chloride 0.9 % 50 mL IVPB   Intravenous Once [name], MD      . sodium chloride 0.9 % injection 10 mL  10 mL Intracatheter PRN Ni Gorsuch, MD      . sodium chloride 0.9 % injection 10 mL  10 mL Intracatheter PRN  [name], MD   10 mL at 05/25/15 1214    Physical Findings: The patient is in no acute distress. Patient is alert and oriented.  height is 6\' 1"  (1.854 m) and weight is 139 lb 14.4 oz (63.458 kg). His temperature is 97.8 F (36.6 C). His blood pressure is 109/72 and his pulse is 100. His oxygen saturation is 95%. Dahlia Byes mucosa is moist with no lesions, no thrush; skin over neck is dry.  Lab Findings: Lab Results  Component Value Date   WBC 4.7 05/25/2015   HGB 10.0* 05/25/2015   HCT 29.3* 05/25/2015   MCV 90.2 05/25/2015   PLT 267 05/25/2015      CMP     Component Value Date/Time   NA 140 05/25/2015 0825   NA 135 02/14/2015 1439   K 4.4 05/25/2015 0825   K 4.2 02/14/2015 1439   CL 100* 02/14/2015 1439   CO2 29 05/25/2015 0825   CO2 24 02/14/2015 1439   GLUCOSE 90 05/25/2015 0825   GLUCOSE 81 02/14/2015 1439   BUN 4.4* 05/25/2015 0825   BUN <5* 02/14/2015 1439   CREATININE 0.6* 05/25/2015 0825   CREATININE 0.65 02/22/2015 1731   CALCIUM 9.1 05/25/2015 0825   CALCIUM 8.5* 02/14/2015 1439   PROT 6.6 05/25/2015 0825   PROT 7.7 02/14/2015 1439   ALBUMIN 3.0* 05/25/2015 0825   ALBUMIN 3.3* 02/14/2015 1439   AST 14 05/25/2015 0825   AST 56* 02/14/2015 1439   ALT <9 05/25/2015 0825   ALT 27 02/14/2015 1439   ALKPHOS 143 05/25/2015 0825   ALKPHOS 113 02/14/2015 1439   BILITOT <0.30 05/25/2015 0825   BILITOT 0.4 02/14/2015 1439   GFRNONAA >60 02/22/2015 1731   GFRAA >60 02/22/2015 1731    Lab Results    Component Value Date   TSH 0.617 03/01/2015     Radiographic Findings: Ct Head Wo Contrast  05/09/2015   CLINICAL DATA:  Fall, head trauma  EXAM: CT HEAD WITHOUT CONTRAST  TECHNIQUE: Contiguous axial images were obtained from the base of the skull through the vertex without intravenous contrast.  COMPARISON:  09/24/2012  FINDINGS: No acute hemorrhage, infarct, or mass lesion is identified. No midline shift. Ventricles are normal in size. Orbits are unremarkable. No skull fracture. Stable minimal asymmetric prominence of the right lateral ventricle as compared to the left. Evidence of chronic right maxillary sinusitis reidentified.  IMPRESSION: No acute intracranial finding.  Chronic right maxillary sinusitis.   Electronically Signed   By: Conchita Paris M.D.   On: 05/09/2015 20:03    Impression/Plan: healing from RT. Apply Biafine to neck.  PET scan and TSH in mid-January, I will follow up then. Will ask [name], RN, our Head and Neck Oncology Navigator to schedule to be seen in head and neck [facility] clinic for survivorship wellness in a month or so.  _____________________________________   [name], MD  This document serves as a record of services personally performed by [name], MD. It was created on her behalf by Derek Mound, a trained medical scribe. The creation of this record is based on the scribe's personal observations and the provider's statements to them. This document has been checked and approved by the attending provider.

## 2015-05-25 NOTE — Patient Instructions (Signed)
Dehydration, Adult Dehydration is when you lose more fluids from the body than you take in. Vital organs like the kidneys, brain, and heart cannot function without a proper amount of fluids and salt. Any loss of fluids from the body can cause dehydration.  CAUSES   Vomiting.  Diarrhea.  Excessive sweating.  Excessive urine output.  Fever. SYMPTOMS  Mild dehydration  Thirst.  Dry lips.  Slightly dry mouth. Moderate dehydration  Very dry mouth.  Sunken eyes.  Skin does not bounce back quickly when lightly pinched and released.  Dark urine and decreased urine production.  Decreased tear production.  Headache. Severe dehydration  Very dry mouth.  Extreme thirst.  Rapid, weak pulse (more than 100 beats per minute at rest).  Cold hands and feet.  Not able to sweat in spite of heat and temperature.  Rapid breathing.  Blue lips.  Confusion and lethargy.  Difficulty being awakened.  Minimal urine production.  No tears. DIAGNOSIS  Your caregiver will diagnose dehydration based on your symptoms and your exam. Blood and urine tests will help confirm the diagnosis. The diagnostic evaluation should also identify the cause of dehydration. TREATMENT  Treatment of mild or moderate dehydration can often be done at home by increasing the amount of fluids that you drink. It is best to drink small amounts of fluid more often. Drinking too much at one time can make vomiting worse. Refer to the home care instructions below. Severe dehydration needs to be treated at the hospital where you will probably be given intravenous (IV) fluids that contain water and electrolytes. HOME CARE INSTRUCTIONS   Ask your caregiver about specific rehydration instructions.  Drink enough fluids to keep your urine clear or pale yellow.  Drink small amounts frequently if you have nausea and vomiting.  Eat as you normally do.  Avoid:  Foods or drinks high in sugar.  Carbonated  drinks.  Juice.  Extremely hot or cold fluids.  Drinks with caffeine.  Fatty, greasy foods.  Alcohol.  Tobacco.  Overeating.  Gelatin desserts.  Wash your hands well to avoid spreading bacteria and viruses.  Only take over-the-counter or prescription medicines for pain, discomfort, or fever as directed by your caregiver.  Ask your caregiver if you should continue all prescribed and over-the-counter medicines.  Keep all follow-up appointments with your caregiver. SEEK MEDICAL CARE IF:  You have abdominal pain and it increases or stays in one area (localizes).  You have a rash, stiff neck, or severe headache.  You are irritable, sleepy, or difficult to awaken.  You are weak, dizzy, or extremely thirsty. SEEK IMMEDIATE MEDICAL CARE IF:   You are unable to keep fluids down or you get worse despite treatment.  You have frequent episodes of vomiting or diarrhea.  You have blood or green matter (bile) in your vomit.  You have blood in your stool or your stool looks black and tarry.  You have not urinated in 6 to 8 hours, or you have only urinated a small amount of very dark urine.  You have a fever.  You faint. MAKE SURE YOU:   Understand these instructions.  Will watch your condition.  Will get help right away if you are not doing well or get worse. Document Released: 08/04/2005 Document Revised: 10/27/2011 Document Reviewed: 03/24/2011 ExitCare Patient Information 2015 [employer]. This information is not intended to replace advice given to you by your health care provider. Make sure you discuss any questions you have with your health care   provider.  

## 2015-05-25 NOTE — Assessment & Plan Note (Signed)
He had significant hypomagnesemia and hypokalemia related to recent chemotherapy. He is currently on IV potassium replacement as well as oral replacement therapy. We will recheck it and of the week

## 2015-05-25 NOTE — Progress Notes (Signed)
[facility] OFFICE PROGRESS NOTE  Patient Care Team: No Pcp Per Patient as PCP - General (General Practice) [name], MD as Consulting Physician (Otolaryngology) [name], MD as Attending Physician (Radiation Oncology) [name], MD as Consulting Physician (Hematology and Oncology) Leota Sauers, RN as Oncology Nurse Navigator  SUMMARY OF ONCOLOGIC HISTORY: Oncology History   Carcinoma of supraglottis   Staging form: Larynx - Supraglottis, AJCC 7th Edition     Clinical stage from 03/13/2015: Stage IVA (T3, N2b, M0) - Signed by [name], MD on 03/13/2015       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[place])   01/22/2015 Imaging CT scan showed enhancing infiltrative mass centered at the right piriform sinus with bilateral neck lymphadenopathy   02/22/2015 - 03/08/2015 Hospital Admission He was admitted to the hospital for management of advanced supraglottic cancer status post tracheostomy, biopsy, dental extraction, placement of feeding tube and subsequent discharge to skilled nursing facility   02/22/2015 Pathology Results Accession: [id number] 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   03/09/2015 PET scan 1)  Right piriform sinus mass with right level 2 nodal metastasis.  2)  Hypermetabolic thoracic nodes which are at least partially felt to be reactive.  3)  No evidence of subdiaphragmatic metastatic disease.    03/16/2015 Procedure He has placement of port   03/21/2015 - 05/04/2015 Chemotherapy He received high dose cisplatin x 3 cycles   03/21/2015 - 05/09/2015 Radiation Therapy He received radiation treatment with chemotherapy   05/09/2015 Miscellaneous He went to the ED with fall    INTERVAL HISTORY: Please see below for problem oriented charting. He returns for further follow-up. He is doing well. He is able to tolerate oral fluids and increase oral diet in  addition to tube feeds. He is compliant taking potassium replacement therapy as directed. Pain is resolving.  REVIEW OF SYSTEMS:   Constitutional: Denies fevers, chills or abnormal weight loss Eyes: Denies blurriness of vision Ears, nose, mouth, throat, and face: Denies mucositis or sore throat Respiratory: Denies cough, dyspnea or wheezes Cardiovascular: Denies palpitation, chest discomfort or lower extremity swelling Gastrointestinal:  Denies nausea, heartburn or change in bowel habits Skin: Denies abnormal skin rashes Lymphatics: Denies new lymphadenopathy or easy bruising Neurological:Denies numbness, tingling or new weaknesses Behavioral/Psych: Mood is stable, no new changes  All other systems were reviewed with the patient and are negative.  I have reviewed the past medical history, past surgical history, social history and family history with the patient and they are unchanged from previous note.  ALLERGIES:  has No Known Allergies.  MEDICATIONS:  Current Outpatient Prescriptions  Medication Sig Dispense Refill  . bisacodyl (DULCOLAX) 10 MG suppository Place 10 mg rectally as needed for moderate constipation.    . butalbital-acetaminophen-caffeine (FIORICET) 50-325-40 MG per tablet Take 1 tablet by mouth every 4 (four) hours as needed for headache. 60 tablet 0  . citalopram (CELEXA) 20 MG tablet 20 mg by PEG Tube route daily.    . feeding supplement, ENSURE, (ENSURE) PUDG Take 1 Container by mouth 3 (three) times daily between meals.    . fentaNYL (DURAGESIC - DOSED MCG/HR) 50 MCG/HR Place 1 patch (50 mcg total) onto the skin every 3 (three) days. 5 patch 0  . folic acid (FOLVITE) 1 MG tablet Take 1 tablet (1 mg total) by mouth daily. (Patient taking differently: 1 mg by PEG Tube route daily. ) 30 tablet  0  . HYDROcodone-acetaminophen (HYCET) 7.5-325 mg/15 ml solution Take 15 mLs by mouth 4 (four) times daily as needed for moderate pain. 120 mL 0  . HYDROmorphone (DILAUDID) 4 MG  tablet Take 1 tablets 30 minutes before meals, up to maximum 6 tablets per day if needed 60 tablet 0  . ibuprofen (ADVIL,MOTRIN) 100 MG/5ML suspension Take 200-400 mg by mouth every 4 (four) hours as needed for mild pain.     Marland Kitchen lidocaine (XYLOCAINE) 2 % solution Mix 1 part 2%viscous lidocaine,1part H2O.Swish and/or swallow 37ml of this mixture,47min before meals and at bedtime, up to QID (Patient taking differently: Mix 1 part 2%viscous lidocaine,1part H2O.Swish and/or swallow 62ml of this mixture,61min before meals and at bedtime, up to QID as needed for sore throat) 100 mL 5  . lidocaine-prilocaine (EMLA) cream Apply 1 application topically as needed (FOR PORT ACCESS).    . LORazepam (ATIVAN) 1 MG tablet Take 0.5 mg by mouth 3 (three) times daily.     . magnesium hydroxide (MILK OF MAGNESIA) 400 MG/5ML suspension Take 30 mLs by mouth daily as needed for mild constipation.    Marland Kitchen morphine (ROXANOL) 20 MG/ML concentrated solution Take 1 mL (20 mg total) by mouth every 2 (two) hours as needed for moderate pain or severe pain. 240 mL 0  . Multiple Vitamin (MULTIVITAMIN WITH MINERALS) TABS tablet Take 1 tablet by mouth daily. (Patient taking differently: 1 tablet by PEG Tube route daily. ) 30 tablet 0  . nicotine (NICODERM CQ - DOSED IN MG/24 HOURS) 21 mg/24hr patch Place 1 patch (21 mg total) onto the skin daily. 28 patch 0  . Nutritional Supplements (FEEDING SUPPLEMENT, JEVITY 1.2 CAL,) LIQD Give 1 can Jevity 1.2 via PEG TID between meals with 125 ml free water before and after bolus feeding.  In addition, give 250 cc free water TID with meals. 711 mL 0  . omeprazole (PRILOSEC) 20 MG capsule 20 mg by PEG Tube route daily.    . ondansetron (ZOFRAN) 8 MG tablet 8 mg by PEG Tube route every 8 (eight) hours as needed for nausea or vomiting.    . potassium chloride 20 MEQ/15ML (10%) SOLN Place 15 mLs (20 mEq total) into feeding tube 3 (three) times daily. 473 mL 0  . pravastatin (PRAVACHOL) 20 MG tablet 20 mg  by PEG Tube route daily.    Marland Kitchen PRESCRIPTION MEDICATION CHEMO [facility]    . prochlorperazine (COMPAZINE) 10 MG tablet 10 mg by PEG Tube route every 6 (six) hours as needed for nausea or vomiting.    . sodium fluoride (PREVIDENT 5000 PLUS) 1.1 % CREA dental cream Apply thin ribbon of cream to tooth brush. Brush teeth for 2 minutes. Spit out excess-DO NOT swallow. DO NOT rinse afterwards. Repeat nightly. 1 Tube prn  . sodium phosphate (FLEET) enema Place 1 enema rectally once as needed (for constipation). follow package directions    . sucralfate (CARAFATE) 1 G tablet Dissolve 1 tablet in 76ml H2O and swallow up to QID,PRN sore throat. 60 tablet 5  . thiamine 100 MG tablet Take 1 tablet (100 mg total) by mouth daily. (Patient taking differently: 100 mg by PEG Tube route daily. ) 30 tablet 0   No current facility-administered medications for this visit.   Facility-Administered Medications Ordered in Other Visits  Medication Dose Route Frequency Provider Last Rate Last Dose  . HYDROmorphone (DILAUDID) injection 2 mg  2 mg Intravenous Q2H PRN [name], MD   2 mg at 05/10/15 1043  .  ondansetron (ZOFRAN) 8 mg in sodium chloride 0.9 % 50 mL IVPB   Intravenous Once [name], MD      . ondansetron (ZOFRAN) 8 mg in sodium chloride 0.9 % 50 mL IVPB   Intravenous Once [name], MD      . sodium chloride 0.9 % injection 10 mL  10 mL Intracatheter PRN [name], MD        PHYSICAL EXAMINATION: ECOG PERFORMANCE STATUS: 0 - Asymptomatic  Filed Vitals:   05/24/15 1113  BP: 125/84  Pulse: 74  Temp: 98.2 F (36.8 C)  Resp: 19   Filed Weights   05/24/15 1113  Weight: 135 lb 3 oz (61.321 kg)    GENERAL:alert, no distress and comfortable SKIN: skin color, texture, turgor are normal, no rashes or significant lesions EYES: normal, Conjunctiva are pink and non-injected, sclera clear OROPHARYNX:no exudate, no erythema and lips, buccal mucosa, and tongue normal  NECK: tracheostomy site looks clean.   LYMPH:  no palpable lymphadenopathy in the cervical, axillary or inguinal LUNGS: clear to auscultation and percussion with normal breathing effort HEART: regular rate & rhythm and no murmurs and no lower extremity edema ABDOMEN:abdomen soft, non-tender and normal bowel sounds. Feeding tube site looks clear Musculoskeletal:no cyanosis of digits and no clubbing  NEURO: alert & oriented x 3 with fluent speech, no focal motor/sensory deficits  LABORATORY DATA:  I have reviewed the data as listed    Component Value Date/Time   NA 140 05/22/2015 1350   NA 135 02/14/2015 1439   K 2.7* 05/22/2015 1350   K 4.2 02/14/2015 1439   CL 100* 02/14/2015 1439   CO2 30* 05/22/2015 1350   CO2 24 02/14/2015 1439   GLUCOSE 118 05/22/2015 1350   GLUCOSE 81 02/14/2015 1439   BUN 6.2* 05/22/2015 1350   BUN <5* 02/14/2015 1439   CREATININE 0.6* 05/22/2015 1350   CREATININE 0.65 02/22/2015 1731   CALCIUM 8.7 05/22/2015 1350   CALCIUM 8.5* 02/14/2015 1439   PROT 6.4 05/22/2015 1350   PROT 7.7 02/14/2015 1439   ALBUMIN 2.9* 05/22/2015 1350   ALBUMIN 3.3* 02/14/2015 1439   AST 16 05/22/2015 1350   AST 56* 02/14/2015 1439   ALT <9 05/22/2015 1350   ALT 27 02/14/2015 1439   ALKPHOS 135 05/22/2015 1350   ALKPHOS 113 02/14/2015 1439   BILITOT 0.30 05/22/2015 1350   BILITOT 0.4 02/14/2015 1439   GFRNONAA >60 02/22/2015 1731   GFRAA >60 02/22/2015 1731    No results found for: SPEP, UPEP  Lab Results  Component Value Date   WBC 2.6* 05/22/2015   NEUTROABS 1.2* 05/22/2015   HGB 9.5* 05/22/2015   HCT 27.9* 05/22/2015   MCV 89.6 05/22/2015   PLT 181 05/22/2015      Chemistry      Component Value Date/Time   NA 140 05/22/2015 1350   NA 135 02/14/2015 1439   K 2.7* 05/22/2015 1350   K 4.2 02/14/2015 1439   CL 100* 02/14/2015 1439   CO2 30* 05/22/2015 1350   CO2 24 02/14/2015 1439   BUN 6.2* 05/22/2015 1350   BUN <5* 02/14/2015 1439   CREATININE 0.6* 05/22/2015 1350   CREATININE 0.65  02/22/2015 1731      Component Value Date/Time   CALCIUM 8.7 05/22/2015 1350   CALCIUM 8.5* 02/14/2015 1439   ALKPHOS 135 05/22/2015 1350   ALKPHOS 113 02/14/2015 1439   AST 16 05/22/2015 1350   AST 56* 02/14/2015 1439   ALT <9 05/22/2015  1350   ALT 27 02/14/2015 1439   BILITOT 0.30 05/22/2015 1350   BILITOT 0.4 02/14/2015 1439      ASSESSMENT & PLAN:  Carcinoma of supraglottis He tolerated treatment very well with objective response to treatment, with resolution of the lymphadenopathy. I will continue aggressive IV fluid support for him     Protein-calorie malnutrition, severe He has no further weight loss. He is able to tolerate some oral diet. Recommend advance diet as tolerated. His dehydration is resolving. I will reduce the frequency of IV fluids and encourage oral fluid intake  S/P gastrostomy  His feeding tube looked loose. We will adjust the feeding so that is more snug against the abdominal wall. It looks clean without signs of infection.    Hypokalemia He had significant hypomagnesemia and hypokalemia related to recent chemotherapy. He is currently on IV potassium replacement as well as oral replacement therapy. We will recheck it and of the week  Pancytopenia due to antineoplastic chemotherapy He has significant pancytopenia after last dose of treatment. He is not symptomatic. We will recheck blood test this week and transfuse as needed   No orders of the defined types were placed in this encounter.   All questions were answered. The patient knows to call the clinic with any problems, questions or concerns. No barriers to learning was detected. I spent 15 minutes counseling the patient face to face. The total time spent in the appointment was 20 minutes and more than 50% was on counseling and review of test results     [facility], [place], MD 05/25/2015 7:28 AM

## 2015-05-25 NOTE — Progress Notes (Signed)
  Oncology Nurse Navigator Documentation   Navigator Encounter Type: Clinic/MDC (05/24/15 1130) Patient Visit Type: Medonc (05/24/15 1130)      To provide support and encouragement, care continuity and to assess for needs, met with patient during est pt appt with Dr. Alvy Bimler. He reported:  Not using continuous feed at HS for supplement b/c "makes me sick".  He continues with bolus feeding during the day in addition to oral intake.  Gained 2 lbs since last visit.  Will continue to live at [facility] for the foreseeable future. He denied needs/concerns, understands he can contact me.    [name], RN, BSN, [facility] [place] [phone]                   Time Spent with Patient: 30 (05/24/15 1130)

## 2015-05-25 NOTE — Assessment & Plan Note (Signed)
He has significant pancytopenia after last dose of treatment. He is not symptomatic. We will recheck blood test this week and transfuse as needed

## 2015-05-25 NOTE — Assessment & Plan Note (Signed)
He tolerated treatment very well with objective response to treatment, with resolution of the lymphadenopathy. I will continue aggressive IV fluid support for him

## 2015-05-25 NOTE — Assessment & Plan Note (Signed)
He has no further weight loss. He is able to tolerate some oral diet. Recommend advance diet as tolerated. His dehydration is resolving. I will reduce the frequency of IV fluids and encourage oral fluid intake

## 2015-05-25 NOTE — Assessment & Plan Note (Signed)
His feeding tube looked loose. We will adjust the feeding so that is more snug against the abdominal wall. It looks clean without signs of infection.

## 2015-05-28 ENCOUNTER — Ambulatory Visit: Payer: Self-pay

## 2015-05-29 ENCOUNTER — Ambulatory Visit: Payer: Self-pay

## 2015-05-30 ENCOUNTER — Ambulatory Visit (HOSPITAL_BASED_OUTPATIENT_CLINIC_OR_DEPARTMENT_OTHER): Payer: Medicaid Other

## 2015-05-30 ENCOUNTER — Ambulatory Visit (HOSPITAL_BASED_OUTPATIENT_CLINIC_OR_DEPARTMENT_OTHER): Payer: Medicaid Other | Admitting: Hematology and Oncology

## 2015-05-30 ENCOUNTER — Other Ambulatory Visit (HOSPITAL_BASED_OUTPATIENT_CLINIC_OR_DEPARTMENT_OTHER): Payer: Medicaid Other

## 2015-05-30 VITALS — BP 100/68 | HR 78 | Temp 97.2°F | Resp 22

## 2015-05-30 VITALS — BP 87/59 | HR 90 | Temp 97.2°F | Resp 22

## 2015-05-30 DIAGNOSIS — C321 Malignant neoplasm of supraglottis: Secondary | ICD-10-CM

## 2015-05-30 DIAGNOSIS — E876 Hypokalemia: Secondary | ICD-10-CM

## 2015-05-30 DIAGNOSIS — D61810 Antineoplastic chemotherapy induced pancytopenia: Secondary | ICD-10-CM

## 2015-05-30 DIAGNOSIS — K1231 Oral mucositis (ulcerative) due to antineoplastic therapy: Secondary | ICD-10-CM

## 2015-05-30 DIAGNOSIS — E43 Unspecified severe protein-calorie malnutrition: Secondary | ICD-10-CM

## 2015-05-30 DIAGNOSIS — T451X5A Adverse effect of antineoplastic and immunosuppressive drugs, initial encounter: Secondary | ICD-10-CM

## 2015-05-30 DIAGNOSIS — R634 Abnormal weight loss: Secondary | ICD-10-CM

## 2015-05-30 LAB — COMPREHENSIVE METABOLIC PANEL (CC13)
ALT: <9 U/L (ref 0–55)
ANION GAP: 12 meq/L — AB (ref 3–11)
AST: 19 U/L (ref 5–34)
Albumin: 3.4 g/dL — ABNORMAL LOW (ref 3.5–5.0)
Alkaline Phosphatase: 145 U/L (ref 40–150)
BUN: 14.8 mg/dL (ref 7.0–26.0)
CALCIUM: 9.8 mg/dL (ref 8.4–10.4)
CHLORIDE: 101 meq/L (ref 98–109)
CO2: 24 mEq/L (ref 22–29)
CREATININE: 0.8 mg/dL (ref 0.7–1.3)
Glucose: 104 mg/dl (ref 70–140)
POTASSIUM: 4.5 meq/L (ref 3.5–5.1)
Sodium: 136 mEq/L (ref 136–145)
Total Bilirubin: 0.46 mg/dL (ref 0.20–1.20)
Total Protein: 7.4 g/dL (ref 6.4–8.3)

## 2015-05-30 LAB — CBC WITH DIFFERENTIAL/PLATELET
BASO%: 0.2 % (ref 0.0–2.0)
Basophils Absolute: 0 10*3/uL (ref 0.0–0.1)
EOS ABS: 0 10*3/uL (ref 0.0–0.5)
EOS%: 0.7 % (ref 0.0–7.0)
HEMATOCRIT: 32.7 % — AB (ref 38.4–49.9)
HEMOGLOBIN: 10.9 g/dL — AB (ref 13.0–17.1)
LYMPH#: 0.8 10*3/uL — AB (ref 0.9–3.3)
LYMPH%: 17.8 % (ref 14.0–49.0)
MCH: 30.5 pg (ref 27.2–33.4)
MCHC: 33.3 g/dL (ref 32.0–36.0)
MCV: 91.6 fL (ref 79.3–98.0)
MONO#: 0.5 10*3/uL (ref 0.1–0.9)
MONO%: 11.6 % (ref 0.0–14.0)
NEUT#: 2.9 10*3/uL (ref 1.5–6.5)
NEUT%: 69.7 % (ref 39.0–75.0)
PLATELETS: 306 10*3/uL (ref 140–400)
RBC: 3.57 10*6/uL — ABNORMAL LOW (ref 4.20–5.82)
RDW: 15.3 % — AB (ref 11.0–14.6)
WBC: 4.2 10*3/uL (ref 4.0–10.3)

## 2015-05-30 LAB — TSH CHCC: TSH: 1.319 m[IU]/L (ref 0.320–4.118)

## 2015-05-30 LAB — MAGNESIUM (CC13): Magnesium: 1.8 mg/dl (ref 1.5–2.5)

## 2015-05-30 MED ORDER — HEPARIN SOD (PORK) LOCK FLUSH 100 UNIT/ML IV SOLN
500.0000 [IU] | Freq: Once | INTRAVENOUS | Status: AC | PRN
  Administered 2015-05-30: 500 [IU]
  Filled 2015-05-30: qty 5

## 2015-05-30 MED ORDER — SODIUM CHLORIDE 0.9 % IV SOLN
Freq: Once | INTRAVENOUS | Status: AC
  Administered 2015-05-30: 09:00:00 via INTRAVENOUS
  Filled 2015-05-30: qty 1000

## 2015-05-30 MED ORDER — SODIUM CHLORIDE 0.9 % IJ SOLN
10.0000 mL | INTRAMUSCULAR | Status: DC | PRN
  Administered 2015-05-30: 10 mL
  Filled 2015-05-30: qty 10

## 2015-05-30 MED ORDER — HYDROMORPHONE HCL 4 MG/ML IJ SOLN
INTRAMUSCULAR | Status: AC
  Filled 2015-05-30: qty 1

## 2015-05-30 MED ORDER — HYDROMORPHONE HCL 4 MG/ML IJ SOLN
2.0000 mg | INTRAMUSCULAR | Status: DC | PRN
  Administered 2015-05-30 (×2): 2 mg via INTRAVENOUS

## 2015-05-30 MED ORDER — SODIUM CHLORIDE 0.9 % IV SOLN
Freq: Once | INTRAVENOUS | Status: AC
  Administered 2015-05-30: 09:00:00 via INTRAVENOUS
  Filled 2015-05-30: qty 4

## 2015-05-30 NOTE — Assessment & Plan Note (Signed)
His pain is well controlled. I recommend he continue on fentanyl patch. He felt that the Dilaudid is helpful. I gave him prescription Dilaudid to take before each meals and will reassess his pain visit.

## 2015-05-30 NOTE — Progress Notes (Signed)
[facility] OFFICE PROGRESS NOTE  Patient Care Team: No Pcp Per Patient as PCP - General (General Practice) [name], MD as Consulting Physician (Otolaryngology) [name], MD as Attending Physician (Radiation Oncology) [name], MD as Consulting Physician (Hematology and Oncology) Leota Sauers, RN as Oncology Nurse Navigator  SUMMARY OF ONCOLOGIC HISTORY: Oncology History   Carcinoma of supraglottis   Staging form: Larynx - Supraglottis, AJCC 7th Edition     Clinical stage from 03/13/2015: Stage IVA (T3, N2b, M0) - Signed by [name], MD on 03/13/2015       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[place])   01/22/2015 Imaging CT scan showed enhancing infiltrative mass centered at the right piriform sinus with bilateral neck lymphadenopathy   02/22/2015 - 03/08/2015 Hospital Admission He was admitted to the hospital for management of advanced supraglottic cancer status post tracheostomy, biopsy, dental extraction, placement of feeding tube and subsequent discharge to skilled nursing facility   02/22/2015 Pathology Results Accession: [id number] 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   03/09/2015 PET scan 1)  Right piriform sinus mass with right level 2 nodal metastasis.  2)  Hypermetabolic thoracic nodes which are at least partially felt to be reactive.  3)  No evidence of subdiaphragmatic metastatic disease.    03/16/2015 Procedure He has placement of port   03/21/2015 - 05/04/2015 Chemotherapy He received high dose cisplatin x 3 cycles   03/21/2015 - 05/09/2015 Radiation Therapy He received radiation treatment with chemotherapy   05/09/2015 Miscellaneous He went to the ED with fall    INTERVAL HISTORY: Please see below for problem oriented charting. He feels well. Denies recent nausea vomiting. He is eating food by mouth. He complained of occasional weakness and  mild dizziness. Denies recent choking. His pain is under good control.  REVIEW OF SYSTEMS:   Constitutional: Denies fevers, chills or abnormal weight loss Eyes: Denies blurriness of vision Respiratory: Denies cough, dyspnea or wheezes Cardiovascular: Denies palpitation, chest discomfort or lower extremity swelling Gastrointestinal:  Denies nausea, heartburn or change in bowel habits Skin: Denies abnormal skin rashes Lymphatics: Denies new lymphadenopathy or easy bruising Neurological:Denies numbness, tingling or new weaknesses Behavioral/Psych: Mood is stable, no new changes  All other systems were reviewed with the patient and are negative.  I have reviewed the past medical history, past surgical history, social history and family history with the patient and they are unchanged from previous note.  ALLERGIES:  has No Known Allergies.  MEDICATIONS:  Current Outpatient Prescriptions  Medication Sig Dispense Refill  . bisacodyl (DULCOLAX) 10 MG suppository Place 10 mg rectally as needed for moderate constipation.    . butalbital-acetaminophen-caffeine (FIORICET) 50-325-40 MG per tablet Take 1 tablet by mouth every 4 (four) hours as needed for headache. 60 tablet 0  . citalopram (CELEXA) 20 MG tablet 20 mg by PEG Tube route daily.    . feeding supplement, ENSURE, (ENSURE) PUDG Take 1 Container by mouth 3 (three) times daily between meals.    . fentaNYL (DURAGESIC - DOSED MCG/HR) 50 MCG/HR Place 1 patch (50 mcg total) onto the skin every 3 (three) days. 5 patch 0  . folic acid (FOLVITE) 1 MG tablet Take 1 tablet (1 mg total) by mouth daily. (Patient taking differently: 1 mg by PEG Tube route daily. ) 30 tablet 0  . HYDROcodone-acetaminophen (HYCET) 7.5-325 mg/15 ml solution Take 15 mLs by mouth 4 (four) times daily  as needed for moderate pain. 120 mL 0  . HYDROmorphone (DILAUDID) 4 MG tablet Take 1 tablets 30 minutes before meals, up to maximum 6 tablets per day if needed 60 tablet 0  .  ibuprofen (ADVIL,MOTRIN) 100 MG/5ML suspension Take 200-400 mg by mouth every 4 (four) hours as needed for mild pain.     Marland Kitchen lidocaine (XYLOCAINE) 2 % solution Mix 1 part 2%viscous lidocaine,1part H2O.Swish and/or swallow 45ml of this mixture,65min before meals and at bedtime, up to QID (Patient taking differently: Mix 1 part 2%viscous lidocaine,1part H2O.Swish and/or swallow 29ml of this mixture,52min before meals and at bedtime, up to QID as needed for sore throat) 100 mL 5  . lidocaine-prilocaine (EMLA) cream Apply 1 application topically as needed (FOR PORT ACCESS).    . LORazepam (ATIVAN) 1 MG tablet Take 0.5 mg by mouth 3 (three) times daily.     . magnesium hydroxide (MILK OF MAGNESIA) 400 MG/5ML suspension Take 30 mLs by mouth daily as needed for mild constipation.    Marland Kitchen morphine (ROXANOL) 20 MG/ML concentrated solution Take 1 mL (20 mg total) by mouth every 2 (two) hours as needed for moderate pain or severe pain. 240 mL 0  . Multiple Vitamin (MULTIVITAMIN WITH MINERALS) TABS tablet Take 1 tablet by mouth daily. (Patient taking differently: 1 tablet by PEG Tube route daily. ) 30 tablet 0  . nicotine (NICODERM CQ - DOSED IN MG/24 HOURS) 21 mg/24hr patch Place 1 patch (21 mg total) onto the skin daily. (Patient not taking: Reported on 05/25/2015) 28 patch 0  . Nutritional Supplements (FEEDING SUPPLEMENT, JEVITY 1.2 CAL,) LIQD Give 1 can Jevity 1.2 via PEG TID between meals with 125 ml free water before and after bolus feeding.  In addition, give 250 cc free water TID with meals. 711 mL 0  . omeprazole (PRILOSEC) 20 MG capsule 20 mg by PEG Tube route daily.    . ondansetron (ZOFRAN) 8 MG tablet 8 mg by PEG Tube route every 8 (eight) hours as needed for nausea or vomiting.    . potassium chloride 20 MEQ/15ML (10%) SOLN Place 15 mLs (20 mEq total) into feeding tube 3 (three) times daily. 473 mL 0  . pravastatin (PRAVACHOL) 20 MG tablet 20 mg by PEG Tube route daily.    . prochlorperazine (COMPAZINE) 10  MG tablet 10 mg by PEG Tube route every 6 (six) hours as needed for nausea or vomiting.    . sodium fluoride (PREVIDENT 5000 PLUS) 1.1 % CREA dental cream Apply thin ribbon of cream to tooth brush. Brush teeth for 2 minutes. Spit out excess-DO NOT swallow. DO NOT rinse afterwards. Repeat nightly. 1 Tube prn  . sodium phosphate (FLEET) enema Place 1 enema rectally once as needed (for constipation). follow package directions    . sucralfate (CARAFATE) 1 G tablet Dissolve 1 tablet in 37ml H2O and swallow up to QID,PRN sore throat. 60 tablet 5  . thiamine 100 MG tablet Take 1 tablet (100 mg total) by mouth daily. (Patient taking differently: 100 mg by PEG Tube route daily. ) 30 tablet 0   No current facility-administered medications for this visit.   Facility-Administered Medications Ordered in Other Visits  Medication Dose Route Frequency Provider Last Rate Last Dose  . heparin lock flush 100 unit/mL  500 Units Intracatheter Once PRN [name], MD      . HYDROmorphone (DILAUDID) injection 2 mg  2 mg Intravenous Q2H PRN [name], MD   2 mg at 05/10/15 1043  .  HYDROmorphone (DILAUDID) injection 2 mg  2 mg Intravenous Q2H PRN [name], MD   2 mg at 05/30/15 0853  . ondansetron (ZOFRAN) 8 mg in sodium chloride 0.9 % 50 mL IVPB   Intravenous Once [name], MD      . ondansetron (ZOFRAN) 8 mg in sodium chloride 0.9 % 50 mL IVPB   Intravenous Once [name], MD      . sodium chloride 0.9 % injection 10 mL  10 mL Intracatheter PRN Declyn Delsol, MD      . sodium chloride 0.9 % injection 10 mL  10 mL Intracatheter PRN [name], MD        PHYSICAL EXAMINATION: ECOG PERFORMANCE STATUS: 1 - Symptomatic but completely ambulatory  Filed Vitals:   05/30/15 0908  BP: 87/59  Pulse: 90  Temp: 97.2 F (36.2 C)  Resp: 22   There were no vitals filed for this visit.  GENERAL:alert, no distress and comfortable SKIN: skin color, texture, turgor are normal, no rashes or significant lesions EYES:  normal, Conjunctiva are pink and non-injected, sclera clear OROPHARYNX:no exudate, no erythema and lips, buccal mucosa, and tongue normal  NECK: Trachostomy in situ. No signs of infection.  LYMPH:  no palpable lymphadenopathy in the cervical, axillary or inguinal LUNGS: clear to auscultation and percussion with normal breathing effort HEART: regular rate & rhythm and no murmurs and no lower extremity edema ABDOMEN:abdomen soft, non-tender and normal bowel sounds. Feeding tube site looks okay Musculoskeletal:no cyanosis of digits and no clubbing  NEURO: alert & oriented x 3 with fluent speech, no focal motor/sensory deficits  LABORATORY DATA:  I have reviewed the data as listed    Component Value Date/Time   NA 136 05/30/2015 0804   NA 135 02/14/2015 1439   K 4.5 05/30/2015 0804   K 4.2 02/14/2015 1439   CL 100* 02/14/2015 1439   CO2 24 05/30/2015 0804   CO2 24 02/14/2015 1439   GLUCOSE 104 05/30/2015 0804   GLUCOSE 81 02/14/2015 1439   BUN 14.8 05/30/2015 0804   BUN <5* 02/14/2015 1439   CREATININE 0.8 05/30/2015 0804   CREATININE 0.65 02/22/2015 1731   CALCIUM 9.8 05/30/2015 0804   CALCIUM 8.5* 02/14/2015 1439   PROT 7.4 05/30/2015 0804   PROT 7.7 02/14/2015 1439   ALBUMIN 3.4* 05/30/2015 0804   ALBUMIN 3.3* 02/14/2015 1439   AST 19 05/30/2015 0804   AST 56* 02/14/2015 1439   ALT <9 05/30/2015 0804   ALT 27 02/14/2015 1439   ALKPHOS 145 05/30/2015 0804   ALKPHOS 113 02/14/2015 1439   BILITOT 0.46 05/30/2015 0804   BILITOT 0.4 02/14/2015 1439   GFRNONAA >60 02/22/2015 1731   GFRAA >60 02/22/2015 1731    No results found for: SPEP, UPEP  Lab Results  Component Value Date   WBC 4.2 05/30/2015   NEUTROABS 2.9 05/30/2015   HGB 10.9* 05/30/2015   HCT 32.7* 05/30/2015   MCV 91.6 05/30/2015   PLT 306 05/30/2015      Chemistry      Component Value Date/Time   NA 136 05/30/2015 0804   NA 135 02/14/2015 1439   K 4.5 05/30/2015 0804   K 4.2 02/14/2015 1439   CL  100* 02/14/2015 1439   CO2 24 05/30/2015 0804   CO2 24 02/14/2015 1439   BUN 14.8 05/30/2015 0804   BUN <5* 02/14/2015 1439   CREATININE 0.8 05/30/2015 0804   CREATININE 0.65 02/22/2015 1731      Component Value  Date/Time   CALCIUM 9.8 05/30/2015 0804   CALCIUM 8.5* 02/14/2015 1439   ALKPHOS 145 05/30/2015 0804   ALKPHOS 113 02/14/2015 1439   AST 19 05/30/2015 0804   AST 56* 02/14/2015 1439   ALT <9 05/30/2015 0804   ALT 27 02/14/2015 1439   BILITOT 0.46 05/30/2015 0804   BILITOT 0.4 02/14/2015 1439     ASSESSMENT & PLAN:  Carcinoma of supraglottis He tolerated treatment very well with objective response to treatment, with resolution of the lymphadenopathy. I will continue aggressive IV fluid support for him until the end of the week. After that, I would discontinue IV fluids and see him in the office in 2 weeks. I will enlist the help of the navigated to arrange a follow-up appointment with ENT in the new future.  Mucositis due to chemotherapy His pain is well controlled. I recommend he continue on fentanyl patch. He felt that the Dilaudid is helpful. I gave him prescription Dilaudid to take before each meals and will reassess his pain visit.  Protein-calorie malnutrition, severe This is improving and the patient is eating food by mouth. Hopefully, we can wean him off feeding tube soon. I would discontinue IV fluids after this week's treatment  Pancytopenia due to antineoplastic chemotherapy [facility]) This is likely due to recent treatment. The patient denies recent history of bleeding such as epistaxis, hematuria or hematochezia. He is asymptomatic from the anemia. I will observe for now.     No orders of the defined types were placed in this encounter.   All questions were answered. The patient knows to call the clinic with any problems, questions or concerns. No barriers to learning was detected. I spent 15 minutes counseling the patient face to face. The total time  spent in the appointment was 20 minutes and more than 50% was on counseling and review of test results     [facility], [place], MD 05/30/2015 9:30 AM

## 2015-05-30 NOTE — Patient Instructions (Signed)
Dehydration, Adult Dehydration is when you lose more fluids from the body than you take in. Vital organs like the kidneys, brain, and heart cannot function without a proper amount of fluids and salt. Any loss of fluids from the body can cause dehydration.  CAUSES   Vomiting.  Diarrhea.  Excessive sweating.  Excessive urine output.  Fever. SYMPTOMS  Mild dehydration  Thirst.  Dry lips.  Slightly dry mouth. Moderate dehydration  Very dry mouth.  Sunken eyes.  Skin does not bounce back quickly when lightly pinched and released.  Dark urine and decreased urine production.  Decreased tear production.  Headache. Severe dehydration  Very dry mouth.  Extreme thirst.  Rapid, weak pulse (more than 100 beats per minute at rest).  Cold hands and feet.  Not able to sweat in spite of heat and temperature.  Rapid breathing.  Blue lips.  Confusion and lethargy.  Difficulty being awakened.  Minimal urine production.  No tears. DIAGNOSIS  Your caregiver will diagnose dehydration based on your symptoms and your exam. Blood and urine tests will help confirm the diagnosis. The diagnostic evaluation should also identify the cause of dehydration. TREATMENT  Treatment of mild or moderate dehydration can often be done at home by increasing the amount of fluids that you drink. It is best to drink small amounts of fluid more often. Drinking too much at one time can make vomiting worse. Refer to the home care instructions below. Severe dehydration needs to be treated at the hospital where you will probably be given intravenous (IV) fluids that contain water and electrolytes. HOME CARE INSTRUCTIONS   Ask your caregiver about specific rehydration instructions.  Drink enough fluids to keep your urine clear or pale yellow.  Drink small amounts frequently if you have nausea and vomiting.  Eat as you normally do.  Avoid:  Foods or drinks high in sugar.  Carbonated  drinks.  Juice.  Extremely hot or cold fluids.  Drinks with caffeine.  Fatty, greasy foods.  Alcohol.  Tobacco.  Overeating.  Gelatin desserts.  Wash your hands well to avoid spreading bacteria and viruses.  Only take over-the-counter or prescription medicines for pain, discomfort, or fever as directed by your caregiver.  Ask your caregiver if you should continue all prescribed and over-the-counter medicines.  Keep all follow-up appointments with your caregiver. SEEK MEDICAL CARE IF:  You have abdominal pain and it increases or stays in one area (localizes).  You have a rash, stiff neck, or severe headache.  You are irritable, sleepy, or difficult to awaken.  You are weak, dizzy, or extremely thirsty. SEEK IMMEDIATE MEDICAL CARE IF:   You are unable to keep fluids down or you get worse despite treatment.  You have frequent episodes of vomiting or diarrhea.  You have blood or green matter (bile) in your vomit.  You have blood in your stool or your stool looks black and tarry.  You have not urinated in 6 to 8 hours, or you have only urinated a small amount of very dark urine.  You have a fever.  You faint. MAKE SURE YOU:   Understand these instructions.  Will watch your condition.  Will get help right away if you are not doing well or get worse. Document Released: 08/04/2005 Document Revised: 10/27/2011 Document Reviewed: 03/24/2011 ExitCare Patient Information 2015 [employer]. This information is not intended to replace advice given to you by your health care provider. Make sure you discuss any questions you have with your health care   provider.  

## 2015-05-30 NOTE — Assessment & Plan Note (Signed)
This is likely due to recent treatment. The patient denies recent history of bleeding such as epistaxis, hematuria or hematochezia. He is asymptomatic from the anemia. I will observe for now.    

## 2015-05-30 NOTE — Assessment & Plan Note (Addendum)
He tolerated treatment very well with objective response to treatment, with resolution of the lymphadenopathy. I will continue aggressive IV fluid support for him until the end of the week. After that, I would discontinue IV fluids and see him in the office in 2 weeks. I will enlist the help of the navigated to arrange a follow-up appointment with ENT in the new future.

## 2015-05-30 NOTE — Assessment & Plan Note (Signed)
This is improving and the patient is eating food by mouth. Hopefully, we can wean him off feeding tube soon. I would discontinue IV fluids after this week's treatment

## 2015-05-31 ENCOUNTER — Ambulatory Visit (HOSPITAL_BASED_OUTPATIENT_CLINIC_OR_DEPARTMENT_OTHER): Payer: Medicaid Other

## 2015-05-31 VITALS — BP 109/60 | HR 70 | Temp 97.2°F | Resp 20

## 2015-05-31 DIAGNOSIS — E43 Unspecified severe protein-calorie malnutrition: Secondary | ICD-10-CM

## 2015-05-31 DIAGNOSIS — C321 Malignant neoplasm of supraglottis: Secondary | ICD-10-CM | POA: Diagnosis not present

## 2015-05-31 DIAGNOSIS — K1231 Oral mucositis (ulcerative) due to antineoplastic therapy: Secondary | ICD-10-CM | POA: Diagnosis not present

## 2015-05-31 MED ORDER — HYDROMORPHONE HCL 4 MG/ML IJ SOLN
INTRAMUSCULAR | Status: AC
  Filled 2015-05-31: qty 1

## 2015-05-31 MED ORDER — HYDROMORPHONE HCL 4 MG/ML IJ SOLN
2.0000 mg | INTRAMUSCULAR | Status: DC | PRN
  Administered 2015-05-31: 2 mg via INTRAVENOUS

## 2015-05-31 MED ORDER — HEPARIN SOD (PORK) LOCK FLUSH 100 UNIT/ML IV SOLN
500.0000 [IU] | Freq: Once | INTRAVENOUS | Status: AC | PRN
  Administered 2015-05-31: 500 [IU]
  Filled 2015-05-31: qty 5

## 2015-05-31 MED ORDER — SODIUM CHLORIDE 0.9 % IV SOLN
Freq: Once | INTRAVENOUS | Status: AC
  Administered 2015-05-31: 13:00:00 via INTRAVENOUS
  Filled 2015-05-31: qty 4

## 2015-05-31 MED ORDER — SODIUM CHLORIDE 0.9 % IJ SOLN
10.0000 mL | INTRAMUSCULAR | Status: DC | PRN
  Administered 2015-05-31: 10 mL
  Filled 2015-05-31: qty 10

## 2015-05-31 MED ORDER — SODIUM CHLORIDE 0.9 % IV SOLN
Freq: Once | INTRAVENOUS | Status: AC
  Administered 2015-05-31: 12:00:00 via INTRAVENOUS

## 2015-05-31 NOTE — Patient Instructions (Signed)

## 2015-06-01 ENCOUNTER — Telehealth: Payer: Self-pay | Admitting: *Deleted

## 2015-06-01 ENCOUNTER — Ambulatory Visit (HOSPITAL_BASED_OUTPATIENT_CLINIC_OR_DEPARTMENT_OTHER): Payer: Medicaid Other

## 2015-06-01 ENCOUNTER — Ambulatory Visit: Payer: Medicaid Other | Admitting: Nutrition

## 2015-06-01 ENCOUNTER — Other Ambulatory Visit: Payer: Self-pay | Admitting: Hematology and Oncology

## 2015-06-01 ENCOUNTER — Telehealth: Payer: Self-pay | Admitting: Hematology and Oncology

## 2015-06-01 VITALS — BP 125/83 | HR 58 | Temp 98.4°F | Resp 18

## 2015-06-01 DIAGNOSIS — K1231 Oral mucositis (ulcerative) due to antineoplastic therapy: Secondary | ICD-10-CM | POA: Diagnosis not present

## 2015-06-01 DIAGNOSIS — E43 Unspecified severe protein-calorie malnutrition: Secondary | ICD-10-CM

## 2015-06-01 DIAGNOSIS — C321 Malignant neoplasm of supraglottis: Secondary | ICD-10-CM | POA: Diagnosis not present

## 2015-06-01 DIAGNOSIS — I951 Orthostatic hypotension: Secondary | ICD-10-CM

## 2015-06-01 MED ORDER — ALTEPLASE 2 MG IJ SOLR
2.0000 mg | Freq: Once | INTRAMUSCULAR | Status: DC | PRN
  Filled 2015-06-01: qty 2

## 2015-06-01 MED ORDER — HYDROMORPHONE HCL PF 4 MG/ML IJ SOLN
2.0000 mg | INTRAMUSCULAR | Status: DC | PRN
  Administered 2015-06-01: 2 mg via INTRAVENOUS
  Filled 2015-06-01: qty 0.5

## 2015-06-01 MED ORDER — SODIUM CHLORIDE 0.9 % IV SOLN
Freq: Once | INTRAVENOUS | Status: AC
  Administered 2015-06-01: 09:00:00 via INTRAVENOUS
  Filled 2015-06-01: qty 4

## 2015-06-01 MED ORDER — HEPARIN SOD (PORK) LOCK FLUSH 100 UNIT/ML IV SOLN
250.0000 [IU] | Freq: Once | INTRAVENOUS | Status: DC | PRN
  Filled 2015-06-01: qty 5

## 2015-06-01 MED ORDER — SODIUM CHLORIDE 0.9 % IJ SOLN
10.0000 mL | INTRAMUSCULAR | Status: DC | PRN
  Administered 2015-06-01: 10 mL
  Filled 2015-06-01: qty 10

## 2015-06-01 MED ORDER — HEPARIN SOD (PORK) LOCK FLUSH 100 UNIT/ML IV SOLN
500.0000 [IU] | Freq: Once | INTRAVENOUS | Status: AC | PRN
  Administered 2015-06-01: 500 [IU]
  Filled 2015-06-01: qty 5

## 2015-06-01 MED ORDER — HYDROMORPHONE HCL 4 MG/ML IJ SOLN
INTRAMUSCULAR | Status: AC
  Filled 2015-06-01: qty 1

## 2015-06-01 MED ORDER — SODIUM CHLORIDE 0.9 % IV SOLN
Freq: Once | INTRAVENOUS | Status: AC
  Administered 2015-06-01: 09:00:00 via INTRAVENOUS

## 2015-06-01 NOTE — Progress Notes (Signed)
10 minutes post administering diluadid- pt rates pain decreased to a 1-2 from a 7 . " feel much better ".  Note pain was associated with established trach.  Trach without any noted abnormalities- insertion site clean dry and intact, and pt able to talk and breath with difficulty.

## 2015-06-01 NOTE — Telephone Encounter (Signed)
Per staff message and POF I have scheduled appts. Advised scheduler of appts and no available on 10/19 and 1020 JMW

## 2015-06-01 NOTE — Progress Notes (Signed)
Nutrition follow-up completed with patient during IV fluids. Patient has completed treatment for cancer of the larynx. Diet remains a dysphagia 1 pudding thick liquid diet with 3 cans of Isosource 1.5 - 3 times a day bolus via feeding tube with 125 mL free water before and after bolus feeding.   Patient reports he no longer allows nocturnal tube feedings secondary to nausea. Last weight was documented as 139.9 pounds October 7. Patient denies nutrition impact symptoms.  Nutrition diagnosis: Unintended weight loss resolved.  It will be important for patient to continue increased oral intake as well as supplemental bolus feedings to maximize calories and protein. Patient continues to be followed by registered dietitian at [facility] where he resides. Encouraged patient to communicate with staff regarding nutrition impact symptoms.

## 2015-06-01 NOTE — Telephone Encounter (Signed)
lvm for pt regarding to 10.14-10.18...advised that i will call with the pt appt for 1.19 and 1.20 at [facility]

## 2015-06-02 ENCOUNTER — Ambulatory Visit (HOSPITAL_BASED_OUTPATIENT_CLINIC_OR_DEPARTMENT_OTHER): Payer: Medicaid Other

## 2015-06-02 VITALS — BP 100/74 | HR 67 | Temp 97.2°F

## 2015-06-02 DIAGNOSIS — C321 Malignant neoplasm of supraglottis: Secondary | ICD-10-CM | POA: Diagnosis not present

## 2015-06-02 DIAGNOSIS — E43 Unspecified severe protein-calorie malnutrition: Secondary | ICD-10-CM | POA: Diagnosis not present

## 2015-06-02 MED ORDER — SODIUM CHLORIDE 0.9 % IV SOLN
Freq: Once | INTRAVENOUS | Status: AC
  Administered 2015-06-02: 09:00:00 via INTRAVENOUS

## 2015-06-02 MED ORDER — SODIUM CHLORIDE 0.9 % IJ SOLN
10.0000 mL | INTRAMUSCULAR | Status: DC | PRN
  Administered 2015-06-02: 10 mL
  Filled 2015-06-02: qty 10

## 2015-06-02 MED ORDER — HEPARIN SOD (PORK) LOCK FLUSH 100 UNIT/ML IV SOLN
500.0000 [IU] | Freq: Once | INTRAVENOUS | Status: AC | PRN
  Administered 2015-06-02: 500 [IU]
  Filled 2015-06-02: qty 5

## 2015-06-02 NOTE — Patient Instructions (Signed)
Dehydration, Adult Dehydration is a condition in which you do not have enough fluid or water in your body. It happens when you take in less fluid than you lose. Vital organs such as the kidneys, brain, and heart cannot function without a proper amount of fluids. Any loss of fluids from the body can cause dehydration.  Dehydration can range from mild to severe. This condition should be treated right away to help prevent it from becoming severe. CAUSES  This condition may be caused by:  Vomiting.  Diarrhea.  Excessive sweating, such as when exercising in hot or humid weather.  Not drinking enough fluid during strenuous exercise or during an illness.  Excessive urine output.  Fever.  Certain medicines. RISK FACTORS This condition is more likely to develop in:  People who are taking certain medicines that cause the body to lose excess fluid (diuretics).   People who have a chronic illness, such as diabetes, that may increase urination.  Older adults.   People who live at high altitudes.   People who participate in endurance sports.  SYMPTOMS  Mild Dehydration  Thirst.  Dry lips.  Slightly dry mouth.  Dry, warm skin. Moderate Dehydration  Very dry mouth.   Muscle cramps.   Dark urine and decreased urine production.   Decreased tear production.   Headache.   Light-headedness, especially when you stand up from a sitting position.  Severe Dehydration  Changes in skin.   Cold and clammy skin.   Skin does not spring back quickly when lightly pinched and released.   Changes in body fluids.   Extreme thirst.   No tears.   Not able to sweat when body temperature is high, such as in hot weather.   Minimal urine production.   Changes in vital signs.   Rapid, weak pulse (more than 100 beats per minute when you are sitting still).   Rapid breathing.   Low blood pressure.   Other changes.   Sunken eyes.   Cold hands and feet.    Confusion.  Lethargy and difficulty being awakened.  Fainting (syncope).   Short-term weight loss.   Unconsciousness. DIAGNOSIS  This condition may be diagnosed based on your symptoms. You may also have tests to determine how severe your dehydration is. These tests may include:   Urine tests.   Blood tests.  TREATMENT  Treatment for this condition depends on the severity. Mild or moderate dehydration can often be treated at home. Treatment should be started right away. Do not wait until dehydration becomes severe. Severe dehydration needs to be treated at the hospital. Treatment for Mild Dehydration  Drinking plenty of water to replace the fluid you have lost.   Replacing minerals in your blood (electrolytes) that you may have lost.  Treatment for Moderate Dehydration  Consuming oral rehydration solution (ORS). Treatment for Severe Dehydration  Receiving fluid through an IV tube.   Receiving electrolyte solution through a feeding tube that is passed through your nose and into your stomach (nasogastric tube or NG tube).  Correcting any abnormalities in electrolytes. HOME CARE INSTRUCTIONS   Drink enough fluid to keep your urine clear or pale yellow.   Drink water or fluid slowly by taking small sips. You can also try sucking on ice cubes.  Have food or beverages that contain electrolytes. Examples include bananas and sports drinks.  Take over-the-counter and prescription medicines only as told by your health care provider.   Prepare ORS according to the manufacturer's instructions. Take sips   of ORS every 5 minutes until your urine returns to normal.  If you have vomiting or diarrhea, continue to try to drink water, ORS, or both.   If you have diarrhea, avoid:   Beverages that contain caffeine.   Fruit juice.   Milk.   Carbonated soft drinks.  Do not take salt tablets. This can lead to the condition of having too much sodium in your body  (hypernatremia).  SEEK MEDICAL CARE IF:  You cannot eat or drink without vomiting.  You have had moderate diarrhea during a period of more than 24 hours.  You have a fever. SEEK IMMEDIATE MEDICAL CARE IF:   You have extreme thirst.  You have severe diarrhea.  You have not urinated in 6-8 hours, or you have urinated only a small amount of very dark urine.  You have shriveled skin.  You are dizzy, confused, or both.   This information is not intended to replace advice given to you by your health care provider. Make sure you discuss any questions you have with your health care provider.   Document Released: 08/04/2005 Document Revised: 04/25/2015 Document Reviewed: 12/20/2014 Elsevier Interactive Patient Education 2016 [place]. Dehydration in Sports Dehydration is a condition in which you do not have enough fluid or water in your body. Your body needs a certain amount of water and other fluid to maintain its blood volume. During exercise, your body may not be able to maintain the fluid levels that are needed to function properly. Dehydration happens when you take in less fluid than you lose. Athletes lose fluid during exercise when they sweat and breathe. Additional fluid is lost during urination, vomiting, and diarrhea. To prevent dehydration, it is important for athletes to take in enough water and fluid to replace the fluid that they lose during exercise. CAUSES Common causes of dehydration among athletes include:  Diarrhea.  Vomiting.  Not drinking enough fluid during strenuous exercise or during an illness.  Not eating enough food during strenuous exercise or during an illness.  Not consuming enough fluid or food after strenuous exercise.  Exercising in hot or humid weather. RISK FACTORS This condition is more likely to develop in:  Athletes who are taking certain medicines that cause the body to lose excess fluid (diuretics).  Athletes who have a chronic  illness, such as diabetes.  Young children.  Older adults.  Athletes who live at high altitudes.  Endurance athletes. SYMPTOMS  Mild Dehydration  Thirst.  Dry lips.  Slightly dry mouth.  Dry, warm skin. Moderate Dehydration  Very dry mouth.  Muscle cramps.  Dark urine and decreased urine production.  Decreased tear production.  Headache.  Light-headedness, especially when you stand up from a sitting position. Severe Dehydration  Changes in skin.  Blue lips.  Skin does not spring back quickly when lightly pinched and released.  Changes in body fluids.  Extreme thirst.  No tears.  Not able to sweat when body temperature is high, such as in hot weather.  Minimal urine production.  Changes in vital signs.  Rapid, weak pulse (more than 100 beats per minute when you are sitting still).  Rapid breathing.  Low blood pressure.  Unconsciousness.  Other changes.  Sunken eyes.  Cold hands and feet.  Confusion and lethargy.  Difficulty being awakened.  Fainting (syncope).  Short-term weight loss. DIAGNOSIS This condition may be diagnosed based on your symptoms. You may also have tests to determine how severe your dehydration is. These tests may include:  Urine tests.  Blood tests. TREATMENT Dehydration should be treated right away. Do not wait until dehydration becomes severe. Treatment depends on the severity of the dehydration. Treatment for Mild Dehydration  Drinking plenty of water to replace the fluid you have lost.  Replacing minerals in your blood (electrolytes) that you may have lost. Treatment for Moderate Dehydration  Consuming oral rehydration solution (ORS). Treatment for Severe Dehydration  Receiving fluid through an IV tube.  Receiving electrolyte solution through a feeding tube that is passed through your nose and into your stomach (nasogastric tube or NG tube). HOME CARE INSTRUCTIONS  Drink enough fluid to keep your  urine clear or pale yellow.  Drink water or fluid slowly by taking small sips.  Have food or beverages that contain electrolytes. Examples include salt, bananas, and sports drinks.  Take over-the-counter and prescription medicines only as told by your health care provider.  Prepare ORS according to the manufacturer's instructions. Take sips of ORS every 5 minutes until your urine returns to normal.  If you have vomiting or diarrhea, continue to try to drink water, ORS, or both.  If you have diarrhea, avoid:  Beverages that contain caffeine.  Fruit juice.  Milk.  Carbonated soft drinks.  Do not take salt tablets. This can lead to the condition of having too much sodium in your body (hypernatremia). PREVENTION  Drink water before, during, and after physical activity, even if you do not feel thirsty. Drink small amounts of water frequently throughout sporting events. Drink more water if you are exercising in hot or humid weather or in high altitudes.  If you are exercising for more than an hour, consider drinking a sports drink.  If you are experiencing vomiting or diarrhea, avoid exercise.  Before and after exercise, eat plenty of foods that have a high water content. These include fruits and vegetables.  Avoid alcohol before, during, and after strenuous exercise. SEEK MEDICAL CARE IF:  You cannot eat or drink without vomiting.  You have severe diarrhea with vomiting or a fever.  You have severe diarrhea without vomiting or a fever.  You have had moderate diarrhea during a period of more than 24 hours. SEEK IMMEDIATE MEDICAL CARE IF:  You have extreme thirst.  You have not urinated in 6-8 hours, or you have urinated only a small amount of very dark urine.  You have shriveled skin.  You are dizzy, confused, or both.   This information is not intended to replace advice given to you by your health care provider. Make sure you discuss any questions you have with your  health care provider.   Document Released: 08/04/2005 Document Revised: 04/25/2015 Document Reviewed: 08/18/2014 Elsevier Interactive Patient Education [employer].

## 2015-06-04 ENCOUNTER — Ambulatory Visit (HOSPITAL_BASED_OUTPATIENT_CLINIC_OR_DEPARTMENT_OTHER): Payer: Medicaid Other

## 2015-06-04 ENCOUNTER — Other Ambulatory Visit (HOSPITAL_BASED_OUTPATIENT_CLINIC_OR_DEPARTMENT_OTHER): Payer: Medicaid Other

## 2015-06-04 VITALS — BP 108/67 | HR 72 | Temp 98.2°F | Resp 18

## 2015-06-04 DIAGNOSIS — E43 Unspecified severe protein-calorie malnutrition: Secondary | ICD-10-CM

## 2015-06-04 DIAGNOSIS — C321 Malignant neoplasm of supraglottis: Secondary | ICD-10-CM

## 2015-06-04 DIAGNOSIS — I951 Orthostatic hypotension: Secondary | ICD-10-CM

## 2015-06-04 DIAGNOSIS — E876 Hypokalemia: Secondary | ICD-10-CM

## 2015-06-04 DIAGNOSIS — K1231 Oral mucositis (ulcerative) due to antineoplastic therapy: Secondary | ICD-10-CM | POA: Diagnosis not present

## 2015-06-04 LAB — CBC WITH DIFFERENTIAL/PLATELET
BASO%: 0.3 % (ref 0.0–2.0)
Basophils Absolute: 0 10*3/uL (ref 0.0–0.1)
EOS%: 0.5 % (ref 0.0–7.0)
Eosinophils Absolute: 0 10*3/uL (ref 0.0–0.5)
HCT: 30.2 % — ABNORMAL LOW (ref 38.4–49.9)
HEMOGLOBIN: 10.4 g/dL — AB (ref 13.0–17.1)
LYMPH%: 23.8 % (ref 14.0–49.0)
MCH: 31.1 pg (ref 27.2–33.4)
MCHC: 34.4 g/dL (ref 32.0–36.0)
MCV: 90.4 fL (ref 79.3–98.0)
MONO#: 0.7 10*3/uL (ref 0.1–0.9)
MONO%: 18.2 % — AB (ref 0.0–14.0)
NEUT%: 57.2 % (ref 39.0–75.0)
NEUTROS ABS: 2.1 10*3/uL (ref 1.5–6.5)
Platelets: 276 10*3/uL (ref 140–400)
RBC: 3.34 10*6/uL — AB (ref 4.20–5.82)
RDW: 15.1 % — AB (ref 11.0–14.6)
WBC: 3.7 10*3/uL — AB (ref 4.0–10.3)
lymph#: 0.9 10*3/uL (ref 0.9–3.3)

## 2015-06-04 LAB — COMPREHENSIVE METABOLIC PANEL (CC13)
ALBUMIN: 3.4 g/dL — AB (ref 3.5–5.0)
ALK PHOS: 128 U/L (ref 40–150)
ALT: 9 U/L (ref 0–55)
ANION GAP: 8 meq/L (ref 3–11)
AST: 16 U/L (ref 5–34)
BUN: 15.5 mg/dL (ref 7.0–26.0)
CALCIUM: 9.6 mg/dL (ref 8.4–10.4)
CHLORIDE: 102 meq/L (ref 98–109)
CO2: 27 mEq/L (ref 22–29)
CREATININE: 0.8 mg/dL (ref 0.7–1.3)
EGFR: >90 mL/min/{1.73_m2} (ref >90)
Glucose: 109 mg/dl (ref 70–140)
Potassium: 4.2 mEq/L (ref 3.5–5.1)
Sodium: 137 mEq/L (ref 136–145)
Total Bilirubin: 0.33 mg/dL (ref 0.20–1.20)
Total Protein: 7.3 g/dL (ref 6.4–8.3)

## 2015-06-04 LAB — TSH CHCC: TSH: 0.529 m[IU]/L (ref 0.320–4.118)

## 2015-06-04 LAB — MAGNESIUM (CC13): MAGNESIUM: 1.7 mg/dL (ref 1.5–2.5)

## 2015-06-04 MED ORDER — SODIUM CHLORIDE 0.9 % IV SOLN
Freq: Once | INTRAVENOUS | Status: AC
  Administered 2015-06-04: 15:00:00 via INTRAVENOUS
  Filled 2015-06-04: qty 4

## 2015-06-04 MED ORDER — HEPARIN SOD (PORK) LOCK FLUSH 100 UNIT/ML IV SOLN
500.0000 [IU] | Freq: Once | INTRAVENOUS | Status: AC | PRN
  Administered 2015-06-04: 500 [IU]
  Filled 2015-06-04: qty 5

## 2015-06-04 MED ORDER — SODIUM CHLORIDE 0.9 % IJ SOLN
10.0000 mL | INTRAMUSCULAR | Status: DC | PRN
  Administered 2015-06-04: 10 mL
  Filled 2015-06-04: qty 10

## 2015-06-04 MED ORDER — SODIUM CHLORIDE 0.9 % IV SOLN
2000.0000 mL | Freq: Once | INTRAVENOUS | Status: AC
  Administered 2015-06-04: 2000 mL via INTRAVENOUS

## 2015-06-04 MED ORDER — HYDROMORPHONE HCL 4 MG/ML IJ SOLN
INTRAMUSCULAR | Status: AC
  Filled 2015-06-04: qty 1

## 2015-06-04 MED ORDER — HYDROMORPHONE HCL 4 MG/ML IJ SOLN
2.0000 mg | INTRAMUSCULAR | Status: DC | PRN
  Administered 2015-06-04: 2 mg via INTRAVENOUS

## 2015-06-04 NOTE — Patient Instructions (Signed)
Dehydration, Adult Dehydration is a condition in which you do not have enough fluid or water in your body. It happens when you take in less fluid than you lose. Vital organs such as the kidneys, brain, and heart cannot function without a proper amount of fluids. Any loss of fluids from the body can cause dehydration.  Dehydration can range from mild to severe. This condition should be treated right away to help prevent it from becoming severe. CAUSES  This condition may be caused by:  Vomiting.  Diarrhea.  Excessive sweating, such as when exercising in hot or humid weather.  Not drinking enough fluid during strenuous exercise or during an illness.  Excessive urine output.  Fever.  Certain medicines. RISK FACTORS This condition is more likely to develop in:  People who are taking certain medicines that cause the body to lose excess fluid (diuretics).   People who have a chronic illness, such as diabetes, that may increase urination.  Older adults.   People who live at high altitudes.   People who participate in endurance sports.  SYMPTOMS  Mild Dehydration  Thirst.  Dry lips.  Slightly dry mouth.  Dry, warm skin. Moderate Dehydration  Very dry mouth.   Muscle cramps.   Dark urine and decreased urine production.   Decreased tear production.   Headache.   Light-headedness, especially when you stand up from a sitting position.  Severe Dehydration  Changes in skin.   Cold and clammy skin.   Skin does not spring back quickly when lightly pinched and released.   Changes in body fluids.   Extreme thirst.   No tears.   Not able to sweat when body temperature is high, such as in hot weather.   Minimal urine production.   Changes in vital signs.   Rapid, weak pulse (more than 100 beats per minute when you are sitting still).   Rapid breathing.   Low blood pressure.   Other changes.   Sunken eyes.   Cold hands and feet.    Confusion.  Lethargy and difficulty being awakened.  Fainting (syncope).   Short-term weight loss.   Unconsciousness. DIAGNOSIS  This condition may be diagnosed based on your symptoms. You may also have tests to determine how severe your dehydration is. These tests may include:   Urine tests.   Blood tests.  TREATMENT  Treatment for this condition depends on the severity. Mild or moderate dehydration can often be treated at home. Treatment should be started right away. Do not wait until dehydration becomes severe. Severe dehydration needs to be treated at the hospital. Treatment for Mild Dehydration  Drinking plenty of water to replace the fluid you have lost.   Replacing minerals in your blood (electrolytes) that you may have lost.  Treatment for Moderate Dehydration  Consuming oral rehydration solution (ORS). Treatment for Severe Dehydration  Receiving fluid through an IV tube.   Receiving electrolyte solution through a feeding tube that is passed through your nose and into your stomach (nasogastric tube or NG tube).  Correcting any abnormalities in electrolytes. HOME CARE INSTRUCTIONS   Drink enough fluid to keep your urine clear or pale yellow.   Drink water or fluid slowly by taking small sips. You can also try sucking on ice cubes.  Have food or beverages that contain electrolytes. Examples include bananas and sports drinks.  Take over-the-counter and prescription medicines only as told by your health care provider.   Prepare ORS according to the manufacturer's instructions. Take sips   of ORS every 5 minutes until your urine returns to normal.  If you have vomiting or diarrhea, continue to try to drink water, ORS, or both.   If you have diarrhea, avoid:   Beverages that contain caffeine.   Fruit juice.   Milk.   Carbonated soft drinks.  Do not take salt tablets. This can lead to the condition of having too much sodium in your body  (hypernatremia).  SEEK MEDICAL CARE IF:  You cannot eat or drink without vomiting.  You have had moderate diarrhea during a period of more than 24 hours.  You have a fever. SEEK IMMEDIATE MEDICAL CARE IF:   You have extreme thirst.  You have severe diarrhea.  You have not urinated in 6-8 hours, or you have urinated only a small amount of very dark urine.  You have shriveled skin.  You are dizzy, confused, or both.   This information is not intended to replace advice given to you by your health care provider. Make sure you discuss any questions you have with your health care provider.   Document Released: 08/04/2005 Document Revised: 04/25/2015 Document Reviewed: 12/20/2014 Elsevier Interactive Patient Education 2016 [place]. Dehydration in Sports Dehydration is a condition in which you do not have enough fluid or water in your body. Your body needs a certain amount of water and other fluid to maintain its blood volume. During exercise, your body may not be able to maintain the fluid levels that are needed to function properly. Dehydration happens when you take in less fluid than you lose. Athletes lose fluid during exercise when they sweat and breathe. Additional fluid is lost during urination, vomiting, and diarrhea. To prevent dehydration, it is important for athletes to take in enough water and fluid to replace the fluid that they lose during exercise. CAUSES Common causes of dehydration among athletes include:  Diarrhea.  Vomiting.  Not drinking enough fluid during strenuous exercise or during an illness.  Not eating enough food during strenuous exercise or during an illness.  Not consuming enough fluid or food after strenuous exercise.  Exercising in hot or humid weather. RISK FACTORS This condition is more likely to develop in:  Athletes who are taking certain medicines that cause the body to lose excess fluid (diuretics).  Athletes who have a chronic  illness, such as diabetes.  Young children.  Older adults.  Athletes who live at high altitudes.  Endurance athletes. SYMPTOMS  Mild Dehydration  Thirst.  Dry lips.  Slightly dry mouth.  Dry, warm skin. Moderate Dehydration  Very dry mouth.  Muscle cramps.  Dark urine and decreased urine production.  Decreased tear production.  Headache.  Light-headedness, especially when you stand up from a sitting position. Severe Dehydration  Changes in skin.  Blue lips.  Skin does not spring back quickly when lightly pinched and released.  Changes in body fluids.  Extreme thirst.  No tears.  Not able to sweat when body temperature is high, such as in hot weather.  Minimal urine production.  Changes in vital signs.  Rapid, weak pulse (more than 100 beats per minute when you are sitting still).  Rapid breathing.  Low blood pressure.  Unconsciousness.  Other changes.  Sunken eyes.  Cold hands and feet.  Confusion and lethargy.  Difficulty being awakened.  Fainting (syncope).  Short-term weight loss. DIAGNOSIS This condition may be diagnosed based on your symptoms. You may also have tests to determine how severe your dehydration is. These tests may include:  Urine tests.  Blood tests. TREATMENT Dehydration should be treated right away. Do not wait until dehydration becomes severe. Treatment depends on the severity of the dehydration. Treatment for Mild Dehydration  Drinking plenty of water to replace the fluid you have lost.  Replacing minerals in your blood (electrolytes) that you may have lost. Treatment for Moderate Dehydration  Consuming oral rehydration solution (ORS). Treatment for Severe Dehydration  Receiving fluid through an IV tube.  Receiving electrolyte solution through a feeding tube that is passed through your nose and into your stomach (nasogastric tube or NG tube). HOME CARE INSTRUCTIONS  Drink enough fluid to keep your  urine clear or pale yellow.  Drink water or fluid slowly by taking small sips.  Have food or beverages that contain electrolytes. Examples include salt, bananas, and sports drinks.  Take over-the-counter and prescription medicines only as told by your health care provider.  Prepare ORS according to the manufacturer's instructions. Take sips of ORS every 5 minutes until your urine returns to normal.  If you have vomiting or diarrhea, continue to try to drink water, ORS, or both.  If you have diarrhea, avoid:  Beverages that contain caffeine.  Fruit juice.  Milk.  Carbonated soft drinks.  Do not take salt tablets. This can lead to the condition of having too much sodium in your body (hypernatremia). PREVENTION  Drink water before, during, and after physical activity, even if you do not feel thirsty. Drink small amounts of water frequently throughout sporting events. Drink more water if you are exercising in hot or humid weather or in high altitudes.  If you are exercising for more than an hour, consider drinking a sports drink.  If you are experiencing vomiting or diarrhea, avoid exercise.  Before and after exercise, eat plenty of foods that have a high water content. These include fruits and vegetables.  Avoid alcohol before, during, and after strenuous exercise. SEEK MEDICAL CARE IF:  You cannot eat or drink without vomiting.  You have severe diarrhea with vomiting or a fever.  You have severe diarrhea without vomiting or a fever.  You have had moderate diarrhea during a period of more than 24 hours. SEEK IMMEDIATE MEDICAL CARE IF:  You have extreme thirst.  You have not urinated in 6-8 hours, or you have urinated only a small amount of very dark urine.  You have shriveled skin.  You are dizzy, confused, or both.   This information is not intended to replace advice given to you by your health care provider. Make sure you discuss any questions you have with your  health care provider.   Document Released: 08/04/2005 Document Revised: 04/25/2015 Document Reviewed: 08/18/2014 Elsevier Interactive Patient Education [employer].

## 2015-06-05 ENCOUNTER — Other Ambulatory Visit: Payer: Self-pay

## 2015-06-05 ENCOUNTER — Telehealth: Payer: Self-pay | Admitting: Hematology and Oncology

## 2015-06-05 ENCOUNTER — Telehealth: Payer: Self-pay | Admitting: *Deleted

## 2015-06-05 ENCOUNTER — Ambulatory Visit (HOSPITAL_BASED_OUTPATIENT_CLINIC_OR_DEPARTMENT_OTHER): Payer: Medicaid Other

## 2015-06-05 VITALS — BP 92/64 | HR 86 | Temp 98.4°F | Resp 20

## 2015-06-05 DIAGNOSIS — C321 Malignant neoplasm of supraglottis: Secondary | ICD-10-CM | POA: Diagnosis not present

## 2015-06-05 DIAGNOSIS — E43 Unspecified severe protein-calorie malnutrition: Secondary | ICD-10-CM

## 2015-06-05 DIAGNOSIS — K1231 Oral mucositis (ulcerative) due to antineoplastic therapy: Secondary | ICD-10-CM | POA: Diagnosis not present

## 2015-06-05 LAB — CORTISOL: Cortisol, Plasma: 10.5 ug/dL

## 2015-06-05 MED ORDER — HYDROMORPHONE HCL 4 MG/ML IJ SOLN
INTRAMUSCULAR | Status: AC
  Filled 2015-06-05: qty 1

## 2015-06-05 MED ORDER — HEPARIN SOD (PORK) LOCK FLUSH 100 UNIT/ML IV SOLN
500.0000 [IU] | Freq: Once | INTRAVENOUS | Status: AC | PRN
  Administered 2015-06-05: 500 [IU]
  Filled 2015-06-05: qty 5

## 2015-06-05 MED ORDER — ALTEPLASE 2 MG IJ SOLR
2.0000 mg | Freq: Once | INTRAMUSCULAR | Status: DC | PRN
  Filled 2015-06-05: qty 2

## 2015-06-05 MED ORDER — HYDROMORPHONE HCL 4 MG/ML IJ SOLN
2.0000 mg | INTRAMUSCULAR | Status: DC | PRN
  Administered 2015-06-05 (×2): 2 mg via INTRAVENOUS

## 2015-06-05 MED ORDER — SODIUM CHLORIDE 0.9 % IV SOLN
Freq: Once | INTRAVENOUS | Status: AC
  Administered 2015-06-05: 14:00:00 via INTRAVENOUS
  Filled 2015-06-05: qty 4

## 2015-06-05 MED ORDER — SODIUM CHLORIDE 0.9 % IJ SOLN
10.0000 mL | INTRAMUSCULAR | Status: DC | PRN
  Administered 2015-06-05: 10 mL
  Filled 2015-06-05: qty 10

## 2015-06-05 MED ORDER — SODIUM CHLORIDE 0.9 % IV SOLN
Freq: Once | INTRAVENOUS | Status: AC
  Administered 2015-06-05: 14:00:00 via INTRAVENOUS

## 2015-06-05 NOTE — Patient Instructions (Signed)
Dehydration, Adult Dehydration is a condition in which you do not have enough fluid or water in your body. It happens when you take in less fluid than you lose. Vital organs such as the kidneys, brain, and heart cannot function without a proper amount of fluids. Any loss of fluids from the body can cause dehydration.  Dehydration can range from mild to severe. This condition should be treated right away to help prevent it from becoming severe. CAUSES  This condition may be caused by:  Vomiting.  Diarrhea.  Excessive sweating, such as when exercising in hot or humid weather.  Not drinking enough fluid during strenuous exercise or during an illness.  Excessive urine output.  Fever.  Certain medicines. RISK FACTORS This condition is more likely to develop in:  People who are taking certain medicines that cause the body to lose excess fluid (diuretics).   People who have a chronic illness, such as diabetes, that may increase urination.  Older adults.   People who live at high altitudes.   People who participate in endurance sports.  SYMPTOMS  Mild Dehydration  Thirst.  Dry lips.  Slightly dry mouth.  Dry, warm skin. Moderate Dehydration  Very dry mouth.   Muscle cramps.   Dark urine and decreased urine production.   Decreased tear production.   Headache.   Light-headedness, especially when you stand up from a sitting position.  Severe Dehydration  Changes in skin.   Cold and clammy skin.   Skin does not spring back quickly when lightly pinched and released.   Changes in body fluids.   Extreme thirst.   No tears.   Not able to sweat when body temperature is high, such as in hot weather.   Minimal urine production.   Changes in vital signs.   Rapid, weak pulse (more than 100 beats per minute when you are sitting still).   Rapid breathing.   Low blood pressure.   Other changes.   Sunken eyes.   Cold hands and feet.    Confusion.  Lethargy and difficulty being awakened.  Fainting (syncope).   Short-term weight loss.   Unconsciousness. DIAGNOSIS  This condition may be diagnosed based on your symptoms. You may also have tests to determine how severe your dehydration is. These tests may include:   Urine tests.   Blood tests.  TREATMENT  Treatment for this condition depends on the severity. Mild or moderate dehydration can often be treated at home. Treatment should be started right away. Do not wait until dehydration becomes severe. Severe dehydration needs to be treated at the hospital. Treatment for Mild Dehydration  Drinking plenty of water to replace the fluid you have lost.   Replacing minerals in your blood (electrolytes) that you may have lost.  Treatment for Moderate Dehydration  Consuming oral rehydration solution (ORS). Treatment for Severe Dehydration  Receiving fluid through an IV tube.   Receiving electrolyte solution through a feeding tube that is passed through your nose and into your stomach (nasogastric tube or NG tube).  Correcting any abnormalities in electrolytes. HOME CARE INSTRUCTIONS   Drink enough fluid to keep your urine clear or pale yellow.   Drink water or fluid slowly by taking small sips. You can also try sucking on ice cubes.  Have food or beverages that contain electrolytes. Examples include bananas and sports drinks.  Take over-the-counter and prescription medicines only as told by your health care provider.   Prepare ORS according to the manufacturer's instructions. Take sips   of ORS every 5 minutes until your urine returns to normal.  If you have vomiting or diarrhea, continue to try to drink water, ORS, or both.   If you have diarrhea, avoid:   Beverages that contain caffeine.   Fruit juice.   Milk.   Carbonated soft drinks.  Do not take salt tablets. This can lead to the condition of having too much sodium in your body  (hypernatremia).  SEEK MEDICAL CARE IF:  You cannot eat or drink without vomiting.  You have had moderate diarrhea during a period of more than 24 hours.  You have a fever. SEEK IMMEDIATE MEDICAL CARE IF:   You have extreme thirst.  You have severe diarrhea.  You have not urinated in 6-8 hours, or you have urinated only a small amount of very dark urine.  You have shriveled skin.  You are dizzy, confused, or both.   This information is not intended to replace advice given to you by your health care provider. Make sure you discuss any questions you have with your health care provider.   Document Released: 08/04/2005 Document Revised: 04/25/2015 Document Reviewed: 12/20/2014 Elsevier Interactive Patient Education 2016 [place]. Dehydration in Sports Dehydration is a condition in which you do not have enough fluid or water in your body. Your body needs a certain amount of water and other fluid to maintain its blood volume. During exercise, your body may not be able to maintain the fluid levels that are needed to function properly. Dehydration happens when you take in less fluid than you lose. Athletes lose fluid during exercise when they sweat and breathe. Additional fluid is lost during urination, vomiting, and diarrhea. To prevent dehydration, it is important for athletes to take in enough water and fluid to replace the fluid that they lose during exercise. CAUSES Common causes of dehydration among athletes include:  Diarrhea.  Vomiting.  Not drinking enough fluid during strenuous exercise or during an illness.  Not eating enough food during strenuous exercise or during an illness.  Not consuming enough fluid or food after strenuous exercise.  Exercising in hot or humid weather. RISK FACTORS This condition is more likely to develop in:  Athletes who are taking certain medicines that cause the body to lose excess fluid (diuretics).  Athletes who have a chronic  illness, such as diabetes.  Young children.  Older adults.  Athletes who live at high altitudes.  Endurance athletes. SYMPTOMS  Mild Dehydration  Thirst.  Dry lips.  Slightly dry mouth.  Dry, warm skin. Moderate Dehydration  Very dry mouth.  Muscle cramps.  Dark urine and decreased urine production.  Decreased tear production.  Headache.  Light-headedness, especially when you stand up from a sitting position. Severe Dehydration  Changes in skin.  Blue lips.  Skin does not spring back quickly when lightly pinched and released.  Changes in body fluids.  Extreme thirst.  No tears.  Not able to sweat when body temperature is high, such as in hot weather.  Minimal urine production.  Changes in vital signs.  Rapid, weak pulse (more than 100 beats per minute when you are sitting still).  Rapid breathing.  Low blood pressure.  Unconsciousness.  Other changes.  Sunken eyes.  Cold hands and feet.  Confusion and lethargy.  Difficulty being awakened.  Fainting (syncope).  Short-term weight loss. DIAGNOSIS This condition may be diagnosed based on your symptoms. You may also have tests to determine how severe your dehydration is. These tests may include:  Urine tests.  Blood tests. TREATMENT Dehydration should be treated right away. Do not wait until dehydration becomes severe. Treatment depends on the severity of the dehydration. Treatment for Mild Dehydration  Drinking plenty of water to replace the fluid you have lost.  Replacing minerals in your blood (electrolytes) that you may have lost. Treatment for Moderate Dehydration  Consuming oral rehydration solution (ORS). Treatment for Severe Dehydration  Receiving fluid through an IV tube.  Receiving electrolyte solution through a feeding tube that is passed through your nose and into your stomach (nasogastric tube or NG tube). HOME CARE INSTRUCTIONS  Drink enough fluid to keep your  urine clear or pale yellow.  Drink water or fluid slowly by taking small sips.  Have food or beverages that contain electrolytes. Examples include salt, bananas, and sports drinks.  Take over-the-counter and prescription medicines only as told by your health care provider.  Prepare ORS according to the manufacturer's instructions. Take sips of ORS every 5 minutes until your urine returns to normal.  If you have vomiting or diarrhea, continue to try to drink water, ORS, or both.  If you have diarrhea, avoid:  Beverages that contain caffeine.  Fruit juice.  Milk.  Carbonated soft drinks.  Do not take salt tablets. This can lead to the condition of having too much sodium in your body (hypernatremia). PREVENTION  Drink water before, during, and after physical activity, even if you do not feel thirsty. Drink small amounts of water frequently throughout sporting events. Drink more water if you are exercising in hot or humid weather or in high altitudes.  If you are exercising for more than an hour, consider drinking a sports drink.  If you are experiencing vomiting or diarrhea, avoid exercise.  Before and after exercise, eat plenty of foods that have a high water content. These include fruits and vegetables.  Avoid alcohol before, during, and after strenuous exercise. SEEK MEDICAL CARE IF:  You cannot eat or drink without vomiting.  You have severe diarrhea with vomiting or a fever.  You have severe diarrhea without vomiting or a fever.  You have had moderate diarrhea during a period of more than 24 hours. SEEK IMMEDIATE MEDICAL CARE IF:  You have extreme thirst.  You have not urinated in 6-8 hours, or you have urinated only a small amount of very dark urine.  You have shriveled skin.  You are dizzy, confused, or both.   This information is not intended to replace advice given to you by your health care provider. Make sure you discuss any questions you have with your  health care provider.   Document Released: 08/04/2005 Document Revised: 04/25/2015 Document Reviewed: 08/18/2014 Elsevier Interactive Patient Education [employer].

## 2015-06-05 NOTE — Telephone Encounter (Signed)
  Oncology Nurse Navigator Documentation   Navigator Encounter Type: Telephone (06/05/15 1021)   Treatment Phase: Other (06/05/15 1021)     Interventions: Coordination of Care (06/05/15 1021)     Spoke with [facility], re status of follow-up appt with Dr. Redmond Baseman. She indicated they have not been able to reach pt with multiple attempts. I provided [facility] phone # as alternate contact to patient's CP. I asked that she let me know when appt confirmed, she agreed.  [name], RN, BSN, [place] [facility] [place] [phone]           Time Spent with Patient: 15 (06/05/15 1021)

## 2015-06-05 NOTE — Telephone Encounter (Signed)
lvm for pt regarding to 10.19 appt at [facility]....advised pt to come to sched to get copy of appts...Marland KitchenMarland Kitchen

## 2015-06-06 ENCOUNTER — Telehealth: Payer: Self-pay | Admitting: *Deleted

## 2015-06-06 ENCOUNTER — Ambulatory Visit (HOSPITAL_COMMUNITY)
Admission: RE | Admit: 2015-06-06 | Discharge: 2015-06-06 | Disposition: A | Discharge status: Home / Self Care | Payer: Medicaid Other | Source: Ambulatory Visit | Attending: Hematology and Oncology | Admitting: Hematology and Oncology

## 2015-06-06 ENCOUNTER — Encounter: Payer: Self-pay | Admitting: *Deleted

## 2015-06-06 ENCOUNTER — Telehealth: Payer: Self-pay | Admitting: Hematology and Oncology

## 2015-06-06 DIAGNOSIS — C321 Malignant neoplasm of supraglottis: Secondary | ICD-10-CM | POA: Insufficient documentation

## 2015-06-06 DIAGNOSIS — E43 Unspecified severe protein-calorie malnutrition: Secondary | ICD-10-CM

## 2015-06-06 MED ORDER — HEPARIN SOD (PORK) LOCK FLUSH 100 UNIT/ML IV SOLN
500.0000 [IU] | Freq: Once | INTRAVENOUS | Status: AC | PRN
  Filled 2015-06-06: qty 5

## 2015-06-06 MED ORDER — SODIUM CHLORIDE 0.9 % IV SOLN
INTRAVENOUS | Status: AC
  Administered 2015-06-06: 1000 mL/h via INTRAVENOUS
  Administered 2015-06-06: 10:00:00 via INTRAVENOUS

## 2015-06-06 MED ORDER — SODIUM CHLORIDE 0.9 % IV SOLN
8.0000 mg | Freq: Once | INTRAVENOUS | Status: AC
  Administered 2015-06-06: 8 mg via INTRAVENOUS
  Filled 2015-06-06: qty 4

## 2015-06-06 MED ORDER — HEPARIN SOD (PORK) LOCK FLUSH 100 UNIT/ML IV SOLN
500.0000 [IU] | INTRAVENOUS | Status: AC | PRN
  Administered 2015-06-06: 500 [IU]
  Filled 2015-06-06: qty 5

## 2015-06-06 MED ORDER — SODIUM CHLORIDE 0.9 % IV SOLN: Freq: Once | INTRAVENOUS | Status: DC

## 2015-06-06 MED ORDER — ONDANSETRON HCL 40 MG/20ML IJ SOLN
Freq: Once | INTRAMUSCULAR | Status: DC
  Filled 2015-06-06: qty 4

## 2015-06-06 MED ORDER — ALTEPLASE 2 MG IJ SOLR
2.0000 mg | Freq: Once | INTRAMUSCULAR | Status: AC | PRN
  Filled 2015-06-06: qty 2

## 2015-06-06 MED ORDER — SODIUM CHLORIDE 0.9 % IJ SOLN
10.0000 mL | INTRAMUSCULAR | Status: AC | PRN
  Administered 2015-06-06: 10 mL

## 2015-06-06 MED ORDER — SODIUM CHLORIDE 0.9 % IJ SOLN
10.0000 mL | INTRAMUSCULAR | Status: DC | PRN
  Filled 2015-06-06: qty 10

## 2015-06-06 NOTE — Telephone Encounter (Signed)
  Oncology Nurse Navigator Documentation   Navigator Encounter Type: Telephone (06/06/15 1505)   Treatment Phase: Other (06/06/15 1505)     Interventions: Coordination of Care (06/06/15 1505)     Received call from [facility] indicating patient has follow-up appt to see Dr. Redmond Baseman 06/13/15 0920.  [name], RN, BSN, [place] at [place] [phone]           Time Spent with Patient: 15 (06/06/15 1505)

## 2015-06-06 NOTE — Progress Notes (Signed)
Diagnosis: Carcinoma of Supra glottis  MD: Dr. Alvy Bimler  Procedure: Infused 2 liters of Normal Saline and Zofran via porta cath  Condition during procedure: Pt tolerated fluids well.  Condition post procedure: Porta cath flushed and de accessed per protocol. Pt alert, oriented and ambulatory.

## 2015-06-06 NOTE — Telephone Encounter (Signed)
lvm for pt regarding to 10.20 appt

## 2015-06-07 ENCOUNTER — Ambulatory Visit: Payer: Self-pay

## 2015-06-08 ENCOUNTER — Ambulatory Visit (HOSPITAL_BASED_OUTPATIENT_CLINIC_OR_DEPARTMENT_OTHER): Payer: Medicaid Other

## 2015-06-08 ENCOUNTER — Other Ambulatory Visit (HOSPITAL_BASED_OUTPATIENT_CLINIC_OR_DEPARTMENT_OTHER): Payer: Medicaid Other

## 2015-06-08 ENCOUNTER — Non-Acute Institutional Stay (SKILLED_NURSING_FACILITY): Payer: Medicaid Other | Admitting: Nurse Practitioner

## 2015-06-08 ENCOUNTER — Encounter: Payer: Self-pay | Admitting: Nurse Practitioner

## 2015-06-08 VITALS — BP 113/85 | HR 109 | Temp 98.7°F | Resp 18

## 2015-06-08 DIAGNOSIS — Z720 Tobacco use: Secondary | ICD-10-CM | POA: Diagnosis not present

## 2015-06-08 DIAGNOSIS — E876 Hypokalemia: Secondary | ICD-10-CM | POA: Diagnosis not present

## 2015-06-08 DIAGNOSIS — K219 Gastro-esophageal reflux disease without esophagitis: Secondary | ICD-10-CM

## 2015-06-08 DIAGNOSIS — F419 Anxiety disorder, unspecified: Secondary | ICD-10-CM | POA: Diagnosis not present

## 2015-06-08 DIAGNOSIS — E43 Unspecified severe protein-calorie malnutrition: Secondary | ICD-10-CM

## 2015-06-08 DIAGNOSIS — C321 Malignant neoplasm of supraglottis: Secondary | ICD-10-CM

## 2015-06-08 LAB — MAGNESIUM (CC13): Magnesium: 1.6 mg/dl (ref 1.5–2.5)

## 2015-06-08 MED ORDER — SODIUM CHLORIDE 0.9 % IV SOLN
Freq: Once | INTRAVENOUS | Status: AC
  Administered 2015-06-08: 14:00:00 via INTRAVENOUS

## 2015-06-08 MED ORDER — HYDROMORPHONE HCL 4 MG/ML IJ SOLN
INTRAMUSCULAR | Status: AC
  Filled 2015-06-08: qty 1

## 2015-06-08 MED ORDER — HYDROMORPHONE HCL 1 MG/ML IJ SOLN
2.0000 mg | INTRAMUSCULAR | Status: DC | PRN
  Filled 2015-06-08: qty 2

## 2015-06-08 MED ORDER — HEPARIN SOD (PORK) LOCK FLUSH 100 UNIT/ML IV SOLN
500.0000 [IU] | Freq: Once | INTRAVENOUS | Status: AC | PRN
  Administered 2015-06-08: 500 [IU]
  Filled 2015-06-08: qty 5

## 2015-06-08 MED ORDER — MORPHINE SULFATE (PF) 4 MG/ML IV SOLN
INTRAVENOUS | Status: AC
  Filled 2015-06-08: qty 1

## 2015-06-08 MED ORDER — SODIUM CHLORIDE 0.9 % IV SOLN
Freq: Once | INTRAVENOUS | Status: AC
  Administered 2015-06-08: 14:00:00 via INTRAVENOUS
  Filled 2015-06-08: qty 4

## 2015-06-08 MED ORDER — HYDROMORPHONE HCL 4 MG/ML IJ SOLN
2.0000 mg | INTRAMUSCULAR | Status: DC | PRN
  Administered 2015-06-08: 2 mg via INTRAVENOUS

## 2015-06-08 MED ORDER — SODIUM CHLORIDE 0.9 % IJ SOLN
10.0000 mL | INTRAMUSCULAR | Status: DC | PRN
  Administered 2015-06-08: 10 mL
  Filled 2015-06-08: qty 10

## 2015-06-08 NOTE — Progress Notes (Signed)
Patient ID: Marvin [name], male   DOB: 1976/03/09, 40 y.o.   MRN: [id number]    Nursing Home Location:  [facility]   Code Status: Full Code   Place of Service: SNF (31)  PCP: No PCP Per Patient  No Known Allergies  Chief Complaint  Patient presents with  . Medical Management of Chronic Issues    Routine Visit     HPI:  Patient is a 39 y.o. male seen today at [facility] for routine follow up on chronic conditions. Pt with a pmh of hx throat pain, muffled voice and weight loss, ETOH and tobacco abuse. 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 Pt currently has trach and PEG. Pt reports strength and appetite slowly improving. Pt denies pain at this time. Reports he has quit smoking and not using patch. Pt has been followed closely by oncology and has completed radiation. Pt has no complaints today. Nursing without acute concerns.   Review of Systems:  Review of Systems  Constitutional: Positive for unexpected weight change (weight loss). Negative for activity change, appetite change and fatigue.  HENT: Negative for congestion and hearing loss.   Eyes: Negative.   Respiratory: Negative for cough and shortness of breath.   Cardiovascular: Negative for chest pain, palpitations and leg swelling.  Gastrointestinal: Negative for abdominal pain, diarrhea and constipation.  Genitourinary: Negative for dysuria and difficulty urinating.  Musculoskeletal: Negative for myalgias and arthralgias.  Skin: Negative for color change and wound.  Neurological: Positive for weakness (generalized, improving). Negative for dizziness.  Psychiatric/Behavioral: Negative for behavioral problems, confusion and agitation.    Past Medical History  Diagnosis Date  . ETOH abuse   . Alcohol dependence ([place]) 04/17/2012  . COPD (chronic obstructive pulmonary disease) ([place])   . Shortness of breath  dyspnea     occ  . Anxiety   . GERD (gastroesophageal reflux disease)   . Seizures ([place])     one episode 09/2012, suspected related to ETOH withdrawal  . Anxiety disorder 03/12/2015  . Headache 05/10/2015  . Hypokalemia 05/10/2015  . 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 Knee surgery Left     kneecap -screws  . Appendectomy    . Tracheostomy tube placement N/A 02/22/2015    Procedure: TRACHEOSTOMY;  Surgeon: [name], MD;  Location: [place];  Service: ENT;  Laterality: N/A;  awake tracheostomy  . Direct laryngoscopy N/A 02/22/2015    Procedure: DIRECT LARYNGOSCOPY;  Surgeon: [name], MD;  Location: [place];  Service: ENT;  Laterality: N/A;  direct laryngoscopy with biopsy  . Multiple extractions with alveoloplasty N/A 03/05/2015    Procedure: Extraction of tooth #'s 1,2,8,9,15,16,17,18,31,32 with alveoloplasty, mandibular right lateral exostoses, and gross debridement of remaining teeth;  Surgeon: Lenn Cal, DDS;  Location: [place];  Service: Oral Surgery;  Laterality: N/A;   Social History:   reports that he quit smoking about 4 months ago. His smoking use included Cigarettes. He has a 28 pack-year smoking history. He has never used smokeless tobacco. He reports that he drinks alcohol. He reports that he does not use illicit drugs.  Family History  Problem Relation Age of Onset  . Cancer Mother     Medications: Patient's Medications  New Prescriptions   No medications on file  Previous Medications   BISACODYL (DULCOLAX) 10 MG SUPPOSITORY    Place  10 mg rectally as needed for moderate constipation.   CITALOPRAM (CELEXA) 20 MG TABLET    20 mg by PEG Tube route daily.   FENTANYL (DURAGESIC - DOSED MCG/HR) 50 MCG/HR    Place 1 patch (50 mcg total) onto the skin every 3 (three) days.   FOLIC ACID (FOLVITE) 1 MG TABLET    Take 1 tablet (1 mg total) by mouth daily.   HYDROMORPHONE (DILAUDID) 4 MG TABLET    Take 1 tablets 30  minutes before meals, up to maximum 6 tablets per day if needed   IBUPROFEN (ADVIL,MOTRIN) 100 MG/5ML SUSPENSION    Take 200-400 mg by mouth every 4 (four) hours as needed for mild pain.    LIDOCAINE (XYLOCAINE) 2 % SOLUTION    Mix 1 part 2%viscous lidocaine,1part H2O.Swish and/or swallow 56ml of this mixture,65min before meals and at bedtime, up to QID   LIDOCAINE-PRILOCAINE (EMLA) CREAM    Apply 1 application topically as needed (FOR PORT ACCESS).   LORAZEPAM (ATIVAN) 1 MG TABLET    Take 0.5 mg by mouth 3 (three) times daily.    MAGNESIUM HYDROXIDE (MILK OF MAGNESIA) 400 MG/5ML SUSPENSION    Take 30 mLs by mouth daily as needed for mild constipation.   MORPHINE (ROXANOL) 20 MG/ML CONCENTRATED SOLUTION    Take 1 mL (20 mg total) by mouth every 2 (two) hours as needed for moderate pain or severe pain.   MULTIPLE VITAMIN (MULTIVITAMIN WITH MINERALS) TABS TABLET    Take 1 tablet by mouth daily.   OMEPRAZOLE (PRILOSEC) 20 MG CAPSULE    20 mg by PEG Tube route daily.   ONDANSETRON (ZOFRAN) 8 MG TABLET    8 mg by PEG Tube route every 8 (eight) hours as needed for nausea or vomiting.   PRAVASTATIN (PRAVACHOL) 20 MG TABLET    20 mg by PEG Tube route daily.   PROCHLORPERAZINE (COMPAZINE) 10 MG TABLET    10 mg by PEG Tube route every 6 (six) hours as needed for nausea or vomiting.   SODIUM FLUORIDE (PREVIDENT 5000 PLUS) 1.1 % CREA DENTAL CREAM    Apply thin ribbon of cream to tooth brush. Brush teeth for 2 minutes. Spit out excess-DO NOT swallow. DO NOT rinse afterwards. Repeat nightly.   SODIUM PHOSPHATE (FLEET) ENEMA    Place 1 enema rectally once as needed (for constipation). follow package directions   SUCRALFATE (CARAFATE) 1 G TABLET    Dissolve 1 tablet in 25ml H2O and swallow up to QID,PRN sore throat.   THIAMINE 100 MG TABLET    Take 1 tablet (100 mg total) by mouth daily.  Modified Medications   Modified Medication Previous Medication   HYDROCODONE-ACETAMINOPHEN (HYCET) 7.5-325 MG/15 ML SOLUTION  HYDROcodone-acetaminophen (HYCET) 7.5-325 mg/15 ml solution      Give 15 mL per g/t every 6 hours as needed for pain    Take 15 mLs by mouth 4 (four) times daily as needed for moderate pain.  Discontinued Medications   BUTALBITAL-ACETAMINOPHEN-CAFFEINE (FIORICET) 50-325-40 MG PER TABLET    Take 1 tablet by mouth every 4 (four) hours as needed for headache.   FEEDING SUPPLEMENT, ENSURE, (ENSURE) PUDG    Take 1 Container by mouth 3 (three) times daily between meals.   NICOTINE (NICODERM CQ - DOSED IN MG/24 HOURS) 21 MG/24HR PATCH    Place 1 patch (21 mg total) onto the skin daily.   NUTRITIONAL SUPPLEMENTS (FEEDING SUPPLEMENT, JEVITY 1.2 CAL,) LIQD    Give 1 can Jevity 1.2 via  PEG TID between meals with 125 ml free water before and after bolus feeding.  In addition, give 250 cc free water TID with meals.   POTASSIUM CHLORIDE 20 MEQ/15ML (10%) SOLN    Place 15 mLs (20 mEq total) into feeding tube 3 (three) times daily.     Physical Exam: Filed Vitals:   06/08/15 1451  BP: 131/79  Pulse: 74  Temp: 98.2 F (36.8 C)  TempSrc: Oral  Resp: 17  Height: 6\' 1"  (1.854 m)  Weight: 128 lb 6.4 oz (58.242 kg)    Physical Exam  Constitutional: He is oriented to person, place, and time. No distress.  Thin male, NAD  HENT:  Head: Normocephalic and atraumatic.  Mouth/Throat: Oropharynx is clear and moist. No oropharyngeal exudate.  Eyes: Conjunctivae and EOM are normal. Pupils are equal, round, and reactive to light.  Neck: Normal range of motion. Neck supple.  Cardiovascular: Normal rate, regular rhythm and normal heart sounds.   Pulmonary/Chest: Effort normal and breath sounds normal.  Trach  Abdominal: Soft. Bowel sounds are normal.  PEG  Musculoskeletal: He exhibits no edema or tenderness.  Neurological: He is alert and oriented to person, place, and time.  Skin: Skin is warm and dry. He is not diaphoretic.  Psychiatric: He has a normal mood and affect.    Labs reviewed: Basic Metabolic  Panel:  Recent Labs  01/21/15 2033 02/14/15 1439  05/25/15 0825 05/30/15 0804 06/04/15 1350 06/08/15 1313  NA 133* 135  < > 140 136 137  --   K 3.6 4.2  < > 4.4 4.5 4.2  --   CL 94* 100*  --   --   --   --   --   CO2  --  24  < > 29 24 27   --   GLUCOSE 90 81  < > 90 104 109  --   BUN <3* <5*  < > 4.4* 14.8 15.5  --   CREATININE 1.20 0.46*  < > 0.6* 0.8 0.8  --   CALCIUM  --  8.5*  < > 9.1 9.8 9.6  --   MG  --   --   < > 1.5 1.8 1.7 1.6  < > = values in this interval not displayed. Liver Function Tests:  Recent Labs  05/25/15 0825 05/30/15 0804 06/04/15 1350  AST 14 19 16   ALT <9 <9 9  ALKPHOS 143 145 128  BILITOT <0.30 0.46 0.33  PROT 6.6 7.4 7.3  ALBUMIN 3.0* 3.4* 3.4*   No results for input(s): LIPASE, AMYLASE in the last 8760 hours. No results for input(s): AMMONIA in the last 8760 hours. CBC:  Recent Labs  05/25/15 0825 05/30/15 0805 06/04/15 1350  WBC 4.7 4.2 3.7*  NEUTROABS 3.3 2.9 2.1  HGB 10.0* 10.9* 10.4*  HCT 29.3* 32.7* 30.2*  MCV 90.2 91.6 90.4  PLT 267 306 276   TSH:  Recent Labs  03/01/15 0341 05/30/15 0805 06/04/15 1350  TSH 0.617 1.319 0.529   A1C: No results found for: HGBA1C Lipid Panel: No results for input(s): CHOL, HDL, LDLCALC, TRIG, CHOLHDL, LDLDIRECT in the last 8760 hours.  Assessment/Plan 1. 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 Cont IV fluid per oncologist   2. Protein-calorie malnutrition, severe ([place]) -pt reports appetite as improved, increased oral intake, conts nocturnal feeing  3. Anxiety disorder, unspecified anxiety disorder type Well controlled on celexa and ativan TID   4. Tobacco abuse -off all nicotine patch, not currently  smoking  5. Gastroesophageal reflux disease without esophagitis -stable at this time, reports minimal GERD, conts on sucralfate as needed and omeprazole daily     Mitchell Epling K. Harle Battiest  [facility] [phone] 8 am - 5 pm) [phone] (after hours)

## 2015-06-08 NOTE — Patient Instructions (Signed)

## 2015-06-11 ENCOUNTER — Ambulatory Visit (HOSPITAL_COMMUNITY): Payer: Self-pay | Admitting: Dentistry

## 2015-06-11 ENCOUNTER — Telehealth: Payer: Self-pay | Admitting: *Deleted

## 2015-06-11 ENCOUNTER — Encounter: Payer: Self-pay | Admitting: Hematology and Oncology

## 2015-06-11 ENCOUNTER — Encounter: Payer: Self-pay | Admitting: *Deleted

## 2015-06-11 ENCOUNTER — Ambulatory Visit: Payer: Medicaid Other

## 2015-06-11 ENCOUNTER — Other Ambulatory Visit: Payer: Self-pay | Admitting: Hematology and Oncology

## 2015-06-11 ENCOUNTER — Ambulatory Visit (HOSPITAL_BASED_OUTPATIENT_CLINIC_OR_DEPARTMENT_OTHER): Payer: Medicaid Other | Admitting: Hematology and Oncology

## 2015-06-11 ENCOUNTER — Other Ambulatory Visit (HOSPITAL_BASED_OUTPATIENT_CLINIC_OR_DEPARTMENT_OTHER): Payer: Medicaid Other

## 2015-06-11 VITALS — BP 73/49 | HR 117 | Temp 98.2°F | Resp 16 | Ht 73.0 in

## 2015-06-11 DIAGNOSIS — E43 Unspecified severe protein-calorie malnutrition: Secondary | ICD-10-CM

## 2015-06-11 DIAGNOSIS — Z931 Gastrostomy status: Secondary | ICD-10-CM

## 2015-06-11 DIAGNOSIS — E876 Hypokalemia: Secondary | ICD-10-CM

## 2015-06-11 DIAGNOSIS — C321 Malignant neoplasm of supraglottis: Secondary | ICD-10-CM

## 2015-06-11 DIAGNOSIS — E860 Dehydration: Secondary | ICD-10-CM

## 2015-06-11 DIAGNOSIS — I951 Orthostatic hypotension: Secondary | ICD-10-CM

## 2015-06-11 LAB — MAGNESIUM (CC13): MAGNESIUM: 1.8 mg/dL (ref 1.5–2.5)

## 2015-06-11 LAB — CBC WITH DIFFERENTIAL/PLATELET
BASO%: 0.7 % (ref 0.0–2.0)
Basophils Absolute: 0 10*3/uL (ref 0.0–0.1)
EOS%: 1.1 % (ref 0.0–7.0)
Eosinophils Absolute: 0.1 10*3/uL (ref 0.0–0.5)
HEMATOCRIT: 32.3 % — AB (ref 38.4–49.9)
HEMOGLOBIN: 11 g/dL — AB (ref 13.0–17.1)
LYMPH#: 0.9 10*3/uL (ref 0.9–3.3)
LYMPH%: 14.6 % (ref 14.0–49.0)
MCH: 31.4 pg (ref 27.2–33.4)
MCHC: 34.1 g/dL (ref 32.0–36.0)
MCV: 92.2 fL (ref 79.3–98.0)
MONO#: 0.8 10*3/uL (ref 0.1–0.9)
MONO%: 13.1 % (ref 0.0–14.0)
NEUT#: 4.3 10*3/uL (ref 1.5–6.5)
NEUT%: 70.5 % (ref 39.0–75.0)
Platelets: 274 10*3/uL (ref 140–400)
RBC: 3.51 10*6/uL — ABNORMAL LOW (ref 4.20–5.82)
RDW: 16.4 % — AB (ref 11.0–14.6)
WBC: 6.1 10*3/uL (ref 4.0–10.3)

## 2015-06-11 LAB — COMPREHENSIVE METABOLIC PANEL (CC13)
ALBUMIN: 3.6 g/dL (ref 3.5–5.0)
ALK PHOS: 117 U/L (ref 40–150)
AST: 17 U/L (ref 5–34)
Anion Gap: 9 mEq/L (ref 3–11)
BILIRUBIN TOTAL: 0.36 mg/dL (ref 0.20–1.20)
BUN: 12.9 mg/dL (ref 7.0–26.0)
CALCIUM: 10 mg/dL (ref 8.4–10.4)
CHLORIDE: 102 meq/L (ref 98–109)
CO2: 27 mEq/L (ref 22–29)
CREATININE: 0.8 mg/dL (ref 0.7–1.3)
EGFR: >90 mL/min/{1.73_m2} (ref >90)
Glucose: 108 mg/dl (ref 70–140)
Potassium: 4.2 mEq/L (ref 3.5–5.1)
Sodium: 138 mEq/L (ref 136–145)
TOTAL PROTEIN: 7.4 g/dL (ref 6.4–8.3)

## 2015-06-11 MED ORDER — HYDROMORPHONE HCL 4 MG/ML IJ SOLN
INTRAMUSCULAR | Status: AC
  Filled 2015-06-11: qty 1

## 2015-06-11 MED ORDER — HYDROMORPHONE HCL 1 MG/ML IJ SOLN
2.0000 mg | INTRAMUSCULAR | Status: DC | PRN
  Administered 2015-06-11: 2 mg via INTRAVENOUS
  Filled 2015-06-11: qty 2

## 2015-06-11 MED ORDER — HEPARIN SOD (PORK) LOCK FLUSH 100 UNIT/ML IV SOLN
500.0000 [IU] | Freq: Once | INTRAVENOUS | Status: DC | PRN
  Filled 2015-06-11: qty 5

## 2015-06-11 MED ORDER — SODIUM CHLORIDE 0.9 % IJ SOLN
10.0000 mL | INTRAMUSCULAR | Status: DC | PRN
  Filled 2015-06-11: qty 10

## 2015-06-11 MED ORDER — ACETAMINOPHEN 325 MG PO TABS
650.0000 mg | ORAL_TABLET | Freq: Once | ORAL | Status: AC
  Administered 2015-06-11: 650 mg via ORAL

## 2015-06-11 MED ORDER — SODIUM CHLORIDE 0.9 % IV SOLN
Freq: Once | INTRAVENOUS | Status: AC
  Administered 2015-06-11: 11:00:00 via INTRAVENOUS

## 2015-06-11 MED ORDER — ACETAMINOPHEN 325 MG PO TABS
ORAL_TABLET | ORAL | Status: AC
  Filled 2015-06-11: qty 2

## 2015-06-11 MED ORDER — SODIUM CHLORIDE 0.9 % IV SOLN
Freq: Once | INTRAVENOUS | Status: AC
  Administered 2015-06-11: 11:00:00 via INTRAVENOUS
  Filled 2015-06-11: qty 4

## 2015-06-11 NOTE — Progress Notes (Signed)
Port flushed done by Chief Strategy Officer.

## 2015-06-11 NOTE — Assessment & Plan Note (Signed)
His feeding tube site looks okay without signs of infection.

## 2015-06-11 NOTE — Telephone Encounter (Signed)
Per staff message and POF I have scheduled appts. Advised scheduler of appts. JMW  

## 2015-06-11 NOTE — Assessment & Plan Note (Signed)
He tolerated treatment very well with objective response to treatment, with resolution of the lymphadenopathy. I will continue aggressive IV fluid support for him For the next 3 weeks. Recently, I stopped giving him IV fluids and he became very hypotensive. He has appointment to go back to ENT in the near future.

## 2015-06-11 NOTE — Assessment & Plan Note (Signed)
Clinically, he is symptomatic with signs of dehydration, hypotension and tachycardia. I will give him 2 L of IV fluids daily for 3 weeks.

## 2015-06-11 NOTE — Progress Notes (Signed)
  Oncology Nurse Navigator Documentation   Navigator Encounter Type: Clinic/MDC (06/11/15 1000) Patient Visit Type: Medonc (06/11/15 1000)     To provide support, care continuity and to assess for needs, met with patient at lab and appt with Dr. Alvy Bimler.  He was in [facility] d/t dizziness with standing.  He reported:  Feeling dizzy this morning when standing up  N&V before lab appt  Eating three meals daily, using PEG at HS for 1-2 cans supplement.  He commented he will start including supplement between meals during the day.  Drinking "lots of water". I assisted RN Cameo with setting up NS infusion in clinic pending space availability in Infusion. Patient verbalized understanding Dr. Alvy Bimler is scheduling him for 2 L IVF daily for next 2 weeks.  [name], RN, BSN, [place] at [place] [phone]                     Time Spent with Patient: 75 (06/11/15 1000)

## 2015-06-11 NOTE — Patient Instructions (Signed)

## 2015-06-11 NOTE — Assessment & Plan Note (Signed)
He is dependent on tube feeds for now. We will get him swallow/speech assessment in the near future after his ENT evaluation.

## 2015-06-11 NOTE — Progress Notes (Signed)
[place] OFFICE PROGRESS NOTE  Patient Care Team: No Pcp Per Patient as PCP - General (General Practice) [name], MD as Consulting Physician (Otolaryngology) [name], MD as Attending Physician (Radiation Oncology) [name], MD as Consulting Physician (Hematology and Oncology) Leota Sauers, RN as Oncology Nurse Navigator  SUMMARY OF ONCOLOGIC HISTORY: Oncology History   Carcinoma of supraglottis   Staging form: Larynx - Supraglottis, AJCC 7th Edition     Clinical stage from 03/13/2015: Stage IVA (T3, N2b, M0) - Signed by [name], MD on 03/13/2015       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[place])   01/22/2015 Imaging CT scan showed enhancing infiltrative mass centered at the right piriform sinus with bilateral neck lymphadenopathy   02/22/2015 - 03/08/2015 Hospital Admission He was admitted to the hospital for management of advanced supraglottic cancer status post tracheostomy, biopsy, dental extraction, placement of feeding tube and subsequent discharge to skilled nursing facility   02/22/2015 Pathology Results Accession: [id number] 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   03/09/2015 PET scan 1)  Right piriform sinus mass with right level 2 nodal metastasis.  2)  Hypermetabolic thoracic nodes which are at least partially felt to be reactive.  3)  No evidence of subdiaphragmatic metastatic disease.    03/16/2015 Procedure He has placement of port   03/21/2015 - 05/04/2015 Chemotherapy He received high dose cisplatin x 3 cycles   03/21/2015 - 05/09/2015 Radiation Therapy Tomotherapy to supraglottic tumor and bilateral neck:  70 Gy in 35 fractions to gross disease, 63 Gy in 35 fractions to high risk nodal echelons, and 56 Gy in 35 fractions to intermediate risk nodal echelons.   05/09/2015 Miscellaneous He went to the ED with fall    INTERVAL HISTORY: Please see  below for problem oriented charting. He is seen urgently today with clinical hypotension. He started to have significant dizziness today and had mild vomiting with that. His blood pressure at the end of last week was normal. Over the weekend, he stated he has been getting adequate nutritional supplement through his feeding tube and he does not feel dehydrated. Denies recent diarrhea. He is not on any medications that could cause low blood pressure.  REVIEW OF SYSTEMS:   Constitutional: Denies fevers, chills or abnormal weight loss Eyes: Denies blurriness of vision Ears, nose, mouth, throat, and face: Denies mucositis or sore throat Respiratory: Denies cough, dyspnea or wheezes Cardiovascular: Denies palpitation, chest discomfort or lower extremity swelling Skin: Denies abnormal skin rashes Lymphatics: Denies new lymphadenopathy or easy bruising Neurological:Denies numbness, tingling or new weaknesses Behavioral/Psych: Mood is stable, no new changes  All other systems were reviewed with the patient and are negative.  I have reviewed the past medical history, past surgical history, social history and family history with the patient and they are unchanged from previous note.  ALLERGIES:  has No Known Allergies.  MEDICATIONS:  Current Outpatient Prescriptions  Medication Sig Dispense Refill  . bisacodyl (DULCOLAX) 10 MG suppository Place 10 mg rectally as needed for moderate constipation.    . citalopram (CELEXA) 20 MG tablet 20 mg by PEG Tube route daily.    . fentaNYL (DURAGESIC - DOSED MCG/HR) 50 MCG/HR Place 1 patch (50 mcg total) onto the skin every 3 (three) days. 5 patch 0  . folic acid (FOLVITE) 1 MG tablet Take 1 tablet (1 mg total) by mouth daily. (Patient taking differently: 1  mg by PEG Tube route daily. ) 30 tablet 0  . HYDROcodone-acetaminophen (HYCET) 7.5-325 mg/15 ml solution Give 15 mL per g/t every 6 hours as needed for pain    . HYDROmorphone (DILAUDID) 4 MG tablet Take  1 tablets 30 minutes before meals, up to maximum 6 tablets per day if needed 60 tablet 0  . ibuprofen (ADVIL,MOTRIN) 100 MG/5ML suspension Take 200-400 mg by mouth every 4 (four) hours as needed for mild pain.     Marland Kitchen lidocaine (XYLOCAINE) 2 % solution Mix 1 part 2%viscous lidocaine,1part H2O.Swish and/or swallow 81ml of this mixture,54min before meals and at bedtime, up to QID (Patient taking differently: Mix 1 part 2%viscous lidocaine,1part H2O.Swish and/or swallow 90ml of this mixture,56min before meals and at bedtime, up to QID as needed for sore throat) 100 mL 5  . lidocaine-prilocaine (EMLA) cream Apply 1 application topically as needed (FOR PORT ACCESS).    . LORazepam (ATIVAN) 1 MG tablet Take 0.5 mg by mouth 3 (three) times daily.     . magnesium hydroxide (MILK OF MAGNESIA) 400 MG/5ML suspension Take 30 mLs by mouth daily as needed for mild constipation.    Marland Kitchen morphine (ROXANOL) 20 MG/ML concentrated solution Take 1 mL (20 mg total) by mouth every 2 (two) hours as needed for moderate pain or severe pain. 240 mL 0  . Multiple Vitamin (MULTIVITAMIN WITH MINERALS) TABS tablet Take 1 tablet by mouth daily. (Patient taking differently: 1 tablet by PEG Tube route daily. ) 30 tablet 0  . omeprazole (PRILOSEC) 20 MG capsule 20 mg by PEG Tube route daily.    . ondansetron (ZOFRAN) 8 MG tablet 8 mg by PEG Tube route every 8 (eight) hours as needed for nausea or vomiting.    . pravastatin (PRAVACHOL) 20 MG tablet 20 mg by PEG Tube route daily.    . prochlorperazine (COMPAZINE) 10 MG tablet 10 mg by PEG Tube route every 6 (six) hours as needed for nausea or vomiting.    . sodium fluoride (PREVIDENT 5000 PLUS) 1.1 % CREA dental cream Apply thin ribbon of cream to tooth brush. Brush teeth for 2 minutes. Spit out excess-DO NOT swallow. DO NOT rinse afterwards. Repeat nightly. 1 Tube prn  . sodium phosphate (FLEET) enema Place 1 enema rectally once as needed (for constipation). follow package directions    .  sucralfate (CARAFATE) 1 G tablet Dissolve 1 tablet in 14ml H2O and swallow up to QID,PRN sore throat. 60 tablet 5  . thiamine 100 MG tablet Take 1 tablet (100 mg total) by mouth daily. (Patient taking differently: 100 mg by PEG Tube route daily. ) 30 tablet 0   Current Facility-Administered Medications  Medication Dose Route Frequency Provider Last Rate Last Dose  . heparin lock flush 100 unit/mL  500 Units Intracatheter Once PRN [name], MD      . HYDROmorphone (DILAUDID) injection 2 mg  2 mg Intravenous Q2H PRN [name], MD   2 mg at 06/11/15 1043  . sodium chloride 0.9 % injection 10 mL  10 mL Intracatheter PRN [name], MD       Facility-Administered Medications Ordered in Other Visits  Medication Dose Route Frequency Provider Last Rate Last Dose  . 0.9 %  sodium chloride infusion   Intravenous Once [name], MD      . ondansetron (ZOFRAN) 8 mg in sodium chloride 0.9 % 50 mL IVPB   Intravenous Once [name], MD      . ondansetron (ZOFRAN) 8 mg  in sodium chloride 0.9 % 50 mL IVPB   Intravenous Once [name], MD      . ondansetron (ZOFRAN) 8 mg in sodium chloride 0.9 % 50 mL IVPB   Intravenous Once [name], MD      . sodium chloride 0.9 % injection 10 mL  10 mL Intracatheter PRN Amoni Morales, MD      . sodium chloride 0.9 % injection 10 mL  10 mL Intracatheter PRN [name], MD        PHYSICAL EXAMINATION: ECOG PERFORMANCE STATUS: 3 - Symptomatic, >50% confined to bed  Filed Vitals:   06/11/15 1019  BP: 73/49  Pulse: 117  Temp: 98.2 F (36.8 C)  Resp: 16   There were no vitals filed for this visit.  GENERAL:alert, no distress and comfortable. He looks thin SKIN: skin color, texture, turgor are normal, no rashes or significant lesions EYES: normal, Conjunctiva are pink and non-injected, sclera clear OROPHARYNX:no exudate, no erythema and lips, buccal mucosa, and tongue normal  NECK: supple, thyroid normal size, non-tender, without nodularity LYMPH:  no palpable  lymphadenopathy in the cervical, axillary or inguinal LUNGS: clear to auscultation and percussion with normal breathing effort HEART: regular rate & rhythm and no murmurs and no lower extremity edema ABDOMEN:abdomen soft, non-tender and normal bowel sounds. Feeding tube site looks okay Musculoskeletal:no cyanosis of digits and no clubbing  NEURO: alert & oriented x 3 with fluent speech, no focal motor/sensory deficits  LABORATORY DATA:  I have reviewed the data as listed    Component Value Date/Time   NA 138 06/11/2015 1005   NA 137 03/15/2015   NA 135 02/14/2015 1439   K 4.2 06/11/2015 1005   K 4.4 03/15/2015   CL 100* 02/14/2015 1439   CO2 27 06/11/2015 1005   CO2 24 02/14/2015 1439   GLUCOSE 108 06/11/2015 1005   GLUCOSE 81 02/14/2015 1439   BUN 12.9 06/11/2015 1005   BUN 9 03/15/2015   BUN <5* 02/14/2015 1439   CREATININE 0.8 06/11/2015 1005   CREATININE 0.5* 03/15/2015   CREATININE 0.65 02/22/2015 1731   CALCIUM 10.0 06/11/2015 1005   CALCIUM 8.5* 02/14/2015 1439   PROT 7.4 06/11/2015 1005   PROT 7.7 02/14/2015 1439   ALBUMIN 3.6 06/11/2015 1005   ALBUMIN 3.3* 02/14/2015 1439   AST 17 06/11/2015 1005   AST 56* 02/14/2015 1439   ALT <9 06/11/2015 1005   ALT 27 02/14/2015 1439   ALKPHOS 117 06/11/2015 1005   ALKPHOS 113 02/14/2015 1439   BILITOT 0.36 06/11/2015 1005   BILITOT 0.4 02/14/2015 1439   GFRNONAA >60 02/22/2015 1731   GFRAA >60 02/22/2015 1731    No results found for: SPEP, UPEP  Lab Results  Component Value Date   WBC 6.1 06/11/2015   NEUTROABS 4.3 06/11/2015   HGB 11.0* 06/11/2015   HCT 32.3* 06/11/2015   MCV 92.2 06/11/2015   PLT 274 06/11/2015      Chemistry      Component Value Date/Time   NA 138 06/11/2015 1005   NA 137 03/15/2015   NA 135 02/14/2015 1439   K 4.2 06/11/2015 1005   K 4.4 03/15/2015   CL 100* 02/14/2015 1439   CO2 27 06/11/2015 1005   CO2 24 02/14/2015 1439   BUN 12.9 06/11/2015 1005   BUN 9 03/15/2015   BUN <5*  02/14/2015 1439   CREATININE 0.8 06/11/2015 1005   CREATININE 0.5* 03/15/2015   CREATININE 0.65 02/22/2015 1731   GLU  86 03/15/2015      Component Value Date/Time   CALCIUM 10.0 06/11/2015 1005   CALCIUM 8.5* 02/14/2015 1439   ALKPHOS 117 06/11/2015 1005   ALKPHOS 113 02/14/2015 1439   AST 17 06/11/2015 1005   AST 56* 02/14/2015 1439   ALT <9 06/11/2015 1005   ALT 27 02/14/2015 1439   BILITOT 0.36 06/11/2015 1005   BILITOT 0.4 02/14/2015 1439     ASSESSMENT & PLAN:  Carcinoma of supraglottis He tolerated treatment very well with objective response to treatment, with resolution of the lymphadenopathy. I will continue aggressive IV fluid support for him For the next 3 weeks. Recently, I stopped giving him IV fluids and he became very hypotensive. He has appointment to go back to ENT in the near future.  Orthostatic hypotension The cause is unknown. He is not on any medication that could cause this. He usually responds well to fluid resuscitation. Clinically, he does not look septic. His blood counts including part of function tests and cortisol level were unremarkable. I will resume IV fluid support and refer him to cardiologist for evaluation of severe hypotension  Dehydration Clinically, he is symptomatic with signs of dehydration, hypotension and tachycardia. I will give him 2 L of IV fluids daily for 3 weeks.  Protein-calorie malnutrition, severe He is dependent on tube feeds for now. We will get him swallow/speech assessment in the near future after his ENT evaluation.  S/P gastrostomy  His feeding tube site looks okay without signs of infection.   Orders Placed This Encounter  Procedures  . Ambulatory referral to Cardiology    Referral Priority:  Routine    Referral Type:  Consultation    Referral Reason:  Specialty Services Required    Requested Specialty:  Cardiology    Number of Visits Requested:  1   All questions were answered. The patient knows to call  the clinic with any problems, questions or concerns. No barriers to learning was detected. I spent 25 minutes counseling the patient face to face. The total time spent in the appointment was 40 minutes and more than 50% was on counseling and review of test results     [facility], [place], MD 06/11/2015 1:15 PM

## 2015-06-11 NOTE — Telephone Encounter (Signed)
Pt missed appt w/ Dr. Enrique Sack today d/t having to stay here in clinic for IVFs and anti emetics.   I called office and left VM asking if they can please r/s pt's appt.  Pt will be here daily for next few weeks.

## 2015-06-11 NOTE — Assessment & Plan Note (Signed)
The cause is unknown. He is not on any medication that could cause this. He usually responds well to fluid resuscitation. Clinically, he does not look septic. His blood counts including part of function tests and cortisol level were unremarkable. I will resume IV fluid support and refer him to cardiologist for evaluation of severe hypotension

## 2015-06-12 ENCOUNTER — Ambulatory Visit (HOSPITAL_BASED_OUTPATIENT_CLINIC_OR_DEPARTMENT_OTHER): Payer: Medicaid Other

## 2015-06-12 VITALS — BP 121/82 | HR 111 | Temp 98.2°F | Resp 16

## 2015-06-12 DIAGNOSIS — E43 Unspecified severe protein-calorie malnutrition: Secondary | ICD-10-CM | POA: Diagnosis not present

## 2015-06-12 DIAGNOSIS — C321 Malignant neoplasm of supraglottis: Secondary | ICD-10-CM

## 2015-06-12 MED ORDER — HYDROMORPHONE HCL 4 MG/ML IJ SOLN
2.0000 mg | INTRAMUSCULAR | Status: DC | PRN
  Administered 2015-06-12: 2 mg via INTRAVENOUS

## 2015-06-12 MED ORDER — SODIUM CHLORIDE 0.9 % IJ SOLN
10.0000 mL | INTRAMUSCULAR | Status: DC | PRN
  Administered 2015-06-12: 10 mL
  Filled 2015-06-12: qty 10

## 2015-06-12 MED ORDER — HYDROMORPHONE HCL 4 MG/ML IJ SOLN
INTRAMUSCULAR | Status: AC
  Filled 2015-06-12: qty 1

## 2015-06-12 MED ORDER — HEPARIN SOD (PORK) LOCK FLUSH 100 UNIT/ML IV SOLN
500.0000 [IU] | Freq: Once | INTRAVENOUS | Status: AC | PRN
  Administered 2015-06-12: 500 [IU]
  Filled 2015-06-12: qty 5

## 2015-06-12 MED ORDER — SODIUM CHLORIDE 0.9 % IV SOLN
Freq: Once | INTRAVENOUS | Status: AC
  Administered 2015-06-12: 15:00:00 via INTRAVENOUS
  Filled 2015-06-12: qty 4

## 2015-06-12 MED ORDER — SODIUM CHLORIDE 0.9 % IV SOLN
Freq: Once | INTRAVENOUS | Status: AC
  Administered 2015-06-12: 15:00:00 via INTRAVENOUS

## 2015-06-12 NOTE — Patient Instructions (Signed)

## 2015-06-13 ENCOUNTER — Telehealth: Payer: Self-pay | Admitting: Hematology and Oncology

## 2015-06-13 ENCOUNTER — Ambulatory Visit (HOSPITAL_BASED_OUTPATIENT_CLINIC_OR_DEPARTMENT_OTHER): Payer: Medicaid Other

## 2015-06-13 ENCOUNTER — Telehealth (HOSPITAL_COMMUNITY): Payer: Self-pay

## 2015-06-13 VITALS — BP 128/85 | HR 80 | Temp 98.5°F

## 2015-06-13 DIAGNOSIS — C321 Malignant neoplasm of supraglottis: Secondary | ICD-10-CM | POA: Diagnosis not present

## 2015-06-13 DIAGNOSIS — K1231 Oral mucositis (ulcerative) due to antineoplastic therapy: Secondary | ICD-10-CM

## 2015-06-13 DIAGNOSIS — E43 Unspecified severe protein-calorie malnutrition: Secondary | ICD-10-CM | POA: Diagnosis not present

## 2015-06-13 MED ORDER — SODIUM CHLORIDE 0.9 % IJ SOLN
10.0000 mL | INTRAMUSCULAR | Status: DC | PRN
  Administered 2015-06-13: 10 mL
  Filled 2015-06-13: qty 10

## 2015-06-13 MED ORDER — HEPARIN SOD (PORK) LOCK FLUSH 100 UNIT/ML IV SOLN
500.0000 [IU] | Freq: Once | INTRAVENOUS | Status: AC | PRN
  Administered 2015-06-13: 500 [IU]
  Filled 2015-06-13: qty 5

## 2015-06-13 MED ORDER — HYDROMORPHONE HCL 4 MG/ML IJ SOLN
INTRAMUSCULAR | Status: AC
  Filled 2015-06-13: qty 1

## 2015-06-13 MED ORDER — SODIUM CHLORIDE 0.9 % IV SOLN
Freq: Once | INTRAVENOUS | Status: AC
  Administered 2015-06-13: 14:00:00 via INTRAVENOUS

## 2015-06-13 MED ORDER — HYDROMORPHONE HCL 1 MG/ML IJ SOLN
2.0000 mg | INTRAMUSCULAR | Status: DC | PRN
  Administered 2015-06-13: 2 mg via INTRAVENOUS
  Filled 2015-06-13: qty 2

## 2015-06-13 MED ORDER — SODIUM CHLORIDE 0.9 % IV SOLN
Freq: Once | INTRAVENOUS | Status: AC
  Administered 2015-06-13: 14:00:00 via INTRAVENOUS
  Filled 2015-06-13: qty 4

## 2015-06-13 NOTE — Telephone Encounter (Signed)
06/12/15           Called and left msg. for patient to call Dental Medicine to reschedule S/P XRT CK appt. w/Dr. Enrique Sack.  LRI

## 2015-06-13 NOTE — Patient Instructions (Signed)

## 2015-06-13 NOTE — Telephone Encounter (Signed)
lvm for pt regarding to todays appt and to get updated sched....pt is sched to see cardiologist on 11.16 @ 11:30am with Dr. Johnsie Cancel

## 2015-06-14 ENCOUNTER — Ambulatory Visit (HOSPITAL_BASED_OUTPATIENT_CLINIC_OR_DEPARTMENT_OTHER): Payer: Medicaid Other

## 2015-06-14 ENCOUNTER — Encounter: Payer: Self-pay | Admitting: *Deleted

## 2015-06-14 VITALS — BP 109/81 | HR 61 | Temp 98.2°F | Resp 18

## 2015-06-14 DIAGNOSIS — E43 Unspecified severe protein-calorie malnutrition: Secondary | ICD-10-CM | POA: Diagnosis not present

## 2015-06-14 DIAGNOSIS — K1231 Oral mucositis (ulcerative) due to antineoplastic therapy: Secondary | ICD-10-CM

## 2015-06-14 DIAGNOSIS — C321 Malignant neoplasm of supraglottis: Secondary | ICD-10-CM | POA: Diagnosis not present

## 2015-06-14 MED ORDER — SODIUM CHLORIDE 0.9 % IJ SOLN
10.0000 mL | INTRAMUSCULAR | Status: AC | PRN
  Administered 2015-06-14: 10 mL
  Filled 2015-06-14: qty 10

## 2015-06-14 MED ORDER — SODIUM CHLORIDE 0.9 % IV SOLN
Freq: Once | INTRAVENOUS | Status: AC
  Administered 2015-06-14: 1000 mL via INTRAVENOUS

## 2015-06-14 MED ORDER — SODIUM CHLORIDE 0.9 % IJ SOLN
10.0000 mL | INTRAMUSCULAR | Status: DC | PRN
  Filled 2015-06-14: qty 10

## 2015-06-14 MED ORDER — HEPARIN SOD (PORK) LOCK FLUSH 100 UNIT/ML IV SOLN
500.0000 [IU] | Freq: Once | INTRAVENOUS | Status: DC | PRN
  Filled 2015-06-14: qty 5

## 2015-06-14 MED ORDER — HEPARIN SOD (PORK) LOCK FLUSH 100 UNIT/ML IV SOLN
500.0000 [IU] | INTRAVENOUS | Status: AC | PRN
  Administered 2015-06-14: 500 [IU]
  Filled 2015-06-14: qty 5

## 2015-06-14 MED ORDER — SODIUM CHLORIDE 0.9 % IV SOLN
Freq: Once | INTRAVENOUS | Status: AC
  Administered 2015-06-14: 13:00:00 via INTRAVENOUS
  Filled 2015-06-14: qty 4

## 2015-06-14 MED ORDER — HYDROMORPHONE HCL 1 MG/ML IJ SOLN
2.0000 mg | INTRAMUSCULAR | Status: DC | PRN
  Administered 2015-06-14: 2 mg via INTRAVENOUS
  Filled 2015-06-14: qty 2

## 2015-06-14 MED ORDER — HYDROMORPHONE HCL 4 MG/ML IJ SOLN
INTRAMUSCULAR | Status: AC
  Filled 2015-06-14: qty 1

## 2015-06-14 NOTE — Patient Instructions (Signed)

## 2015-06-14 NOTE — Progress Notes (Signed)
  Oncology Nurse Navigator Documentation   Navigator Encounter Type: Treatment (06/14/15 1330) Patient Visit Type: Medonc (06/14/15 1330)       Interventions: Coordination of Care (06/14/15 1330)     Met with patient in Infusion where he was receiving scheduled IVF. 1. I followed up on his previously expressed interest in meeting with Mike Craze for Survivorship.  He indicated availability to attend next Wed afternoon's [place] to meet with Elzie Rings, I noted I will coordinate a time with his 1:45 appt for IVF and follow-up with him. 2. He informed he met with ENT Dr. Redmond Baseman yesterday.  His mother and sister accompanied him.  Dr. Redmond Baseman performed laryngoscopy, did not see any evidence of tumor.  Dr Redmond Baseman plans to fit him with closed trach valve next week for a couple day trial to see how tolerated.  If positive outcome, the plan is to have trach removed. 3. I learned patient has new cell phone #, updated information in Epic, notified Lisa with Dental Medicine who has been trying to reach him to reschedule follow-up with Dr. Enrique Sack.  [name], RN, BSN, [place] at [place] [phone]            Time Spent with Patient: 15 (06/14/15 1330)

## 2015-06-15 ENCOUNTER — Ambulatory Visit (HOSPITAL_BASED_OUTPATIENT_CLINIC_OR_DEPARTMENT_OTHER): Payer: Medicaid Other

## 2015-06-15 ENCOUNTER — Ambulatory Visit: Payer: Self-pay | Admitting: Radiation Oncology

## 2015-06-15 VITALS — BP 112/83 | HR 66 | Temp 97.7°F | Resp 18

## 2015-06-15 DIAGNOSIS — E43 Unspecified severe protein-calorie malnutrition: Secondary | ICD-10-CM

## 2015-06-15 DIAGNOSIS — C321 Malignant neoplasm of supraglottis: Secondary | ICD-10-CM

## 2015-06-15 MED ORDER — HEPARIN SOD (PORK) LOCK FLUSH 100 UNIT/ML IV SOLN
500.0000 [IU] | Freq: Once | INTRAVENOUS | Status: AC | PRN
  Administered 2015-06-15: 500 [IU]
  Filled 2015-06-15: qty 5

## 2015-06-15 MED ORDER — SODIUM CHLORIDE 0.9 % IV SOLN
Freq: Once | INTRAVENOUS | Status: AC
  Administered 2015-06-15: 13:00:00 via INTRAVENOUS

## 2015-06-15 MED ORDER — SODIUM CHLORIDE 0.9 % IJ SOLN
10.0000 mL | INTRAMUSCULAR | Status: DC | PRN
  Administered 2015-06-15: 10 mL
  Filled 2015-06-15: qty 10

## 2015-06-15 MED ORDER — HYDROMORPHONE HCL 4 MG/ML IJ SOLN
INTRAMUSCULAR | Status: AC
  Filled 2015-06-15: qty 1

## 2015-06-15 MED ORDER — HYDROMORPHONE HCL 1 MG/ML IJ SOLN
2.0000 mg | INTRAMUSCULAR | Status: DC | PRN
  Administered 2015-06-15: 2 mg via INTRAVENOUS
  Filled 2015-06-15: qty 2

## 2015-06-15 MED ORDER — SODIUM CHLORIDE 0.9 % IV SOLN
Freq: Once | INTRAVENOUS | Status: AC
  Administered 2015-06-15: 13:00:00 via INTRAVENOUS
  Filled 2015-06-15: qty 4

## 2015-06-15 NOTE — Patient Instructions (Signed)
Dehydration, Adult Dehydration is when you lose more fluids from the body than you take in. Vital organs like the kidneys, brain, and heart cannot function without a proper amount of fluids and salt. Any loss of fluids from the body can cause dehydration.  CAUSES   Vomiting.  Diarrhea.  Excessive sweating.  Excessive urine output.  Fever. SYMPTOMS  Mild dehydration  Thirst.  Dry lips.  Slightly dry mouth. Moderate dehydration  Very dry mouth.  Sunken eyes.  Skin does not bounce back quickly when lightly pinched and released.  Dark urine and decreased urine production.  Decreased tear production.  Headache. Severe dehydration  Very dry mouth.  Extreme thirst.  Rapid, weak pulse (more than 100 beats per minute at rest).  Cold hands and feet.  Not able to sweat in spite of heat and temperature.  Rapid breathing.  Blue lips.  Confusion and lethargy.  Difficulty being awakened.  Minimal urine production.  No tears. DIAGNOSIS  Your caregiver will diagnose dehydration based on your symptoms and your exam. Blood and urine tests will help confirm the diagnosis. The diagnostic evaluation should also identify the cause of dehydration. TREATMENT  Treatment of mild or moderate dehydration can often be done at home by increasing the amount of fluids that you drink. It is best to drink small amounts of fluid more often. Drinking too much at one time can make vomiting worse. Refer to the home care instructions below. Severe dehydration needs to be treated at the hospital where you will probably be given intravenous (IV) fluids that contain water and electrolytes. HOME CARE INSTRUCTIONS   Ask your caregiver about specific rehydration instructions.  Drink enough fluids to keep your urine clear or pale yellow.  Drink small amounts frequently if you have nausea and vomiting.  Eat as you normally do.  Avoid:  Foods or drinks high in sugar.  Carbonated  drinks.  Juice.  Extremely hot or cold fluids.  Drinks with caffeine.  Fatty, greasy foods.  Alcohol.  Tobacco.  Overeating.  Gelatin desserts.  Wash your hands well to avoid spreading bacteria and viruses.  Only take over-the-counter or prescription medicines for pain, discomfort, or fever as directed by your caregiver.  Ask your caregiver if you should continue all prescribed and over-the-counter medicines.  Keep all follow-up appointments with your caregiver. SEEK MEDICAL CARE IF:  You have abdominal pain and it increases or stays in one area (localizes).  You have a rash, stiff neck, or severe headache.  You are irritable, sleepy, or difficult to awaken.  You are weak, dizzy, or extremely thirsty. SEEK IMMEDIATE MEDICAL CARE IF:   You are unable to keep fluids down or you get worse despite treatment.  You have frequent episodes of vomiting or diarrhea.  You have blood or green matter (bile) in your vomit.  You have blood in your stool or your stool looks black and tarry.  You have not urinated in 6 to 8 hours, or you have only urinated a small amount of very dark urine.  You have a fever.  You faint. MAKE SURE YOU:   Understand these instructions.  Will watch your condition.  Will get help right away if you are not doing well or get worse. Document Released: 08/04/2005 Document Revised: 10/27/2011 Document Reviewed: 03/24/2011 ExitCare Patient Information 2015 [employer]. This information is not intended to replace advice given to you by your health care provider. Make sure you discuss any questions you have with your health care   provider.  Nausea and Vomiting Nausea is a sick feeling that often comes before throwing up (vomiting). Vomiting is a reflex where stomach contents come out of your mouth. Vomiting can cause severe loss of body fluids (dehydration). Children and elderly adults can become dehydrated quickly, especially if they also have  diarrhea. Nausea and vomiting are symptoms of a condition or disease. It is important to find the cause of your symptoms. CAUSES   Direct irritation of the stomach lining. This irritation can result from increased acid production (gastroesophageal reflux disease), infection, food poisoning, taking certain medicines (such as nonsteroidal anti-inflammatory drugs), alcohol use, or tobacco use.  Signals from the brain.These signals could be caused by a headache, heat exposure, an inner ear disturbance, increased pressure in the brain from injury, infection, a tumor, or a concussion, pain, emotional stimulus, or metabolic problems.  An obstruction in the gastrointestinal tract (bowel obstruction).  Illnesses such as diabetes, hepatitis, gallbladder problems, appendicitis, kidney problems, cancer, sepsis, atypical symptoms of a heart attack, or eating disorders.  Medical treatments such as chemotherapy and radiation.  Receiving medicine that makes you sleep (general anesthetic) during surgery. DIAGNOSIS Your caregiver may ask for tests to be done if the problems do not improve after a few days. Tests may also be done if symptoms are severe or if the reason for the nausea and vomiting is not clear. Tests may include:  Urine tests.  Blood tests.  Stool tests.  Cultures (to look for evidence of infection).  X-rays or other imaging studies. Test results can help your caregiver make decisions about treatment or the need for additional tests. TREATMENT You need to stay well hydrated. Drink frequently but in small amounts.You may wish to drink water, sports drinks, clear broth, or eat frozen ice pops or gelatin dessert to help stay hydrated.When you eat, eating slowly may help prevent nausea.There are also some antinausea medicines that may help prevent nausea. HOME CARE INSTRUCTIONS   Take all medicine as directed by your caregiver.  If you do not have an appetite, do not force yourself to  eat. However, you must continue to drink fluids.  If you have an appetite, eat a normal diet unless your caregiver tells you differently.  Eat a variety of complex carbohydrates (rice, wheat, potatoes, bread), lean meats, yogurt, fruits, and vegetables.  Avoid high-fat foods because they are more difficult to digest.  Drink enough water and fluids to keep your urine clear or pale yellow.  If you are dehydrated, ask your caregiver for specific rehydration instructions. Signs of dehydration may include:  Severe thirst.  Dry lips and mouth.  Dizziness.  Dark urine.  Decreasing urine frequency and amount.  Confusion.  Rapid breathing or pulse. SEEK IMMEDIATE MEDICAL CARE IF:   You have blood or brown flecks (like coffee grounds) in your vomit.  You have black or bloody stools.  You have a severe headache or stiff neck.  You are confused.  You have severe abdominal pain.  You have chest pain or trouble breathing.  You do not urinate at least once every 8 hours.  You develop cold or clammy skin.  You continue to vomit for longer than 24 to 48 hours.  You have a fever. MAKE SURE YOU:   Understand these instructions.  Will watch your condition.  Will get help right away if you are not doing well or get worse.   This information is not intended to replace advice given to you by your health care  provider. Make sure you discuss any questions you have with your health care provider.   Document Released: 08/04/2005 Document Revised: 10/27/2011 Document Reviewed: 01/01/2011 Elsevier Interactive Patient Education [employer].

## 2015-06-16 ENCOUNTER — Ambulatory Visit (HOSPITAL_BASED_OUTPATIENT_CLINIC_OR_DEPARTMENT_OTHER): Payer: Medicaid Other

## 2015-06-16 VITALS — BP 106/84 | HR 79 | Temp 98.3°F | Resp 16

## 2015-06-16 DIAGNOSIS — C321 Malignant neoplasm of supraglottis: Secondary | ICD-10-CM | POA: Diagnosis not present

## 2015-06-16 DIAGNOSIS — E860 Dehydration: Secondary | ICD-10-CM | POA: Diagnosis not present

## 2015-06-16 DIAGNOSIS — E43 Unspecified severe protein-calorie malnutrition: Secondary | ICD-10-CM

## 2015-06-16 MED ORDER — SODIUM CHLORIDE 0.9 % IV SOLN
Freq: Once | INTRAVENOUS | Status: AC
  Administered 2015-06-16: 08:00:00 via INTRAVENOUS

## 2015-06-16 MED ORDER — HYDROMORPHONE HCL 1 MG/ML IJ SOLN
2.0000 mg | INTRAMUSCULAR | Status: DC | PRN
  Administered 2015-06-16: 2 mg via INTRAVENOUS

## 2015-06-16 MED ORDER — HYDROMORPHONE HCL 4 MG/ML IJ SOLN
INTRAMUSCULAR | Status: AC
  Filled 2015-06-16: qty 1

## 2015-06-16 MED ORDER — HEPARIN SOD (PORK) LOCK FLUSH 100 UNIT/ML IV SOLN
500.0000 [IU] | Freq: Once | INTRAVENOUS | Status: AC | PRN
  Administered 2015-06-16: 500 [IU]
  Filled 2015-06-16: qty 5

## 2015-06-16 MED ORDER — SODIUM CHLORIDE 0.9 % IJ SOLN
10.0000 mL | INTRAMUSCULAR | Status: DC | PRN
  Administered 2015-06-16: 10 mL
  Filled 2015-06-16: qty 10

## 2015-06-16 NOTE — Progress Notes (Signed)
9:40am  Patient reports his pain is a 1/10- "if that".  His pain is almost completely gone.

## 2015-06-18 ENCOUNTER — Ambulatory Visit (HOSPITAL_BASED_OUTPATIENT_CLINIC_OR_DEPARTMENT_OTHER): Payer: Medicaid Other

## 2015-06-18 ENCOUNTER — Encounter: Payer: Self-pay | Admitting: Adult Health

## 2015-06-18 ENCOUNTER — Other Ambulatory Visit: Payer: Self-pay | Admitting: *Deleted

## 2015-06-18 ENCOUNTER — Encounter: Payer: Self-pay | Admitting: *Deleted

## 2015-06-18 VITALS — BP 124/77 | HR 59 | Temp 98.2°F | Resp 20

## 2015-06-18 DIAGNOSIS — C321 Malignant neoplasm of supraglottis: Secondary | ICD-10-CM

## 2015-06-18 DIAGNOSIS — E43 Unspecified severe protein-calorie malnutrition: Secondary | ICD-10-CM | POA: Diagnosis not present

## 2015-06-18 MED ORDER — SODIUM CHLORIDE 0.9 % IJ SOLN
10.0000 mL | INTRAMUSCULAR | Status: DC | PRN
  Administered 2015-06-18: 10 mL
  Filled 2015-06-18: qty 10

## 2015-06-18 MED ORDER — FENTANYL 50 MCG/HR TD PT72: MEDICATED_PATCH | TRANSDERMAL | Status: DC

## 2015-06-18 MED ORDER — SODIUM CHLORIDE 0.9 % IV SOLN
Freq: Once | INTRAVENOUS | Status: AC
  Administered 2015-06-18: 15:00:00 via INTRAVENOUS

## 2015-06-18 MED ORDER — SODIUM CHLORIDE 0.9 % IV SOLN
Freq: Once | INTRAVENOUS | Status: AC
  Administered 2015-06-18: 15:00:00 via INTRAVENOUS
  Filled 2015-06-18: qty 4

## 2015-06-18 MED ORDER — HYDROMORPHONE HCL 4 MG/ML IJ SOLN
INTRAMUSCULAR | Status: AC
  Filled 2015-06-18: qty 1

## 2015-06-18 MED ORDER — HEPARIN SOD (PORK) LOCK FLUSH 100 UNIT/ML IV SOLN
500.0000 [IU] | Freq: Once | INTRAVENOUS | Status: AC | PRN
  Administered 2015-06-18: 500 [IU]
  Filled 2015-06-18: qty 5

## 2015-06-18 MED ORDER — HYDROMORPHONE HCL 1 MG/ML IJ SOLN
2.0000 mg | INTRAMUSCULAR | Status: DC | PRN
  Administered 2015-06-18: 2 mg via INTRAVENOUS
  Filled 2015-06-18: qty 2

## 2015-06-18 NOTE — Patient Instructions (Signed)
Dehydration, Adult Dehydration is when you lose more fluids from the body than you take in. Vital organs like the kidneys, brain, and heart cannot function without a proper amount of fluids and salt. Any loss of fluids from the body can cause dehydration.  CAUSES   Vomiting.  Diarrhea.  Excessive sweating.  Excessive urine output.  Fever. SYMPTOMS  Mild dehydration  Thirst.  Dry lips.  Slightly dry mouth. Moderate dehydration  Very dry mouth.  Sunken eyes.  Skin does not bounce back quickly when lightly pinched and released.  Dark urine and decreased urine production.  Decreased tear production.  Headache. Severe dehydration  Very dry mouth.  Extreme thirst.  Rapid, weak pulse (more than 100 beats per minute at rest).  Cold hands and feet.  Not able to sweat in spite of heat and temperature.  Rapid breathing.  Blue lips.  Confusion and lethargy.  Difficulty being awakened.  Minimal urine production.  No tears. DIAGNOSIS  Your caregiver will diagnose dehydration based on your symptoms and your exam. Blood and urine tests will help confirm the diagnosis. The diagnostic evaluation should also identify the cause of dehydration. TREATMENT  Treatment of mild or moderate dehydration can often be done at home by increasing the amount of fluids that you drink. It is best to drink small amounts of fluid more often. Drinking too much at one time can make vomiting worse. Refer to the home care instructions below. Severe dehydration needs to be treated at the hospital where you will probably be given intravenous (IV) fluids that contain water and electrolytes. HOME CARE INSTRUCTIONS   Ask your caregiver about specific rehydration instructions.  Drink enough fluids to keep your urine clear or pale yellow.  Drink small amounts frequently if you have nausea and vomiting.  Eat as you normally do.  Avoid:  Foods or drinks high in sugar.  Carbonated  drinks.  Juice.  Extremely hot or cold fluids.  Drinks with caffeine.  Fatty, greasy foods.  Alcohol.  Tobacco.  Overeating.  Gelatin desserts.  Wash your hands well to avoid spreading bacteria and viruses.  Only take over-the-counter or prescription medicines for pain, discomfort, or fever as directed by your caregiver.  Ask your caregiver if you should continue all prescribed and over-the-counter medicines.  Keep all follow-up appointments with your caregiver. SEEK MEDICAL CARE IF:  You have abdominal pain and it increases or stays in one area (localizes).  You have a rash, stiff neck, or severe headache.  You are irritable, sleepy, or difficult to awaken.  You are weak, dizzy, or extremely thirsty. SEEK IMMEDIATE MEDICAL CARE IF:   You are unable to keep fluids down or you get worse despite treatment.  You have frequent episodes of vomiting or diarrhea.  You have blood or green matter (bile) in your vomit.  You have blood in your stool or your stool looks black and tarry.  You have not urinated in 6 to 8 hours, or you have only urinated a small amount of very dark urine.  You have a fever.  You faint. MAKE SURE YOU:   Understand these instructions.  Will watch your condition.  Will get help right away if you are not doing well or get worse. Document Released: 08/04/2005 Document Revised: 10/27/2011 Document Reviewed: 03/24/2011 ExitCare Patient Information 2015 [employer]. This information is not intended to replace advice given to you by your health care provider. Make sure you discuss any questions you have with your health care   provider.  Nausea and Vomiting Nausea is a sick feeling that often comes before throwing up (vomiting). Vomiting is a reflex where stomach contents come out of your mouth. Vomiting can cause severe loss of body fluids (dehydration). Children and elderly adults can become dehydrated quickly, especially if they also have  diarrhea. Nausea and vomiting are symptoms of a condition or disease. It is important to find the cause of your symptoms. CAUSES   Direct irritation of the stomach lining. This irritation can result from increased acid production (gastroesophageal reflux disease), infection, food poisoning, taking certain medicines (such as nonsteroidal anti-inflammatory drugs), alcohol use, or tobacco use.  Signals from the brain.These signals could be caused by a headache, heat exposure, an inner ear disturbance, increased pressure in the brain from injury, infection, a tumor, or a concussion, pain, emotional stimulus, or metabolic problems.  An obstruction in the gastrointestinal tract (bowel obstruction).  Illnesses such as diabetes, hepatitis, gallbladder problems, appendicitis, kidney problems, cancer, sepsis, atypical symptoms of a heart attack, or eating disorders.  Medical treatments such as chemotherapy and radiation.  Receiving medicine that makes you sleep (general anesthetic) during surgery. DIAGNOSIS Your caregiver may ask for tests to be done if the problems do not improve after a few days. Tests may also be done if symptoms are severe or if the reason for the nausea and vomiting is not clear. Tests may include:  Urine tests.  Blood tests.  Stool tests.  Cultures (to look for evidence of infection).  X-rays or other imaging studies. Test results can help your caregiver make decisions about treatment or the need for additional tests. TREATMENT You need to stay well hydrated. Drink frequently but in small amounts.You may wish to drink water, sports drinks, clear broth, or eat frozen ice pops or gelatin dessert to help stay hydrated.When you eat, eating slowly may help prevent nausea.There are also some antinausea medicines that may help prevent nausea. HOME CARE INSTRUCTIONS   Take all medicine as directed by your caregiver.  If you do not have an appetite, do not force yourself to  eat. However, you must continue to drink fluids.  If you have an appetite, eat a normal diet unless your caregiver tells you differently.  Eat a variety of complex carbohydrates (rice, wheat, potatoes, bread), lean meats, yogurt, fruits, and vegetables.  Avoid high-fat foods because they are more difficult to digest.  Drink enough water and fluids to keep your urine clear or pale yellow.  If you are dehydrated, ask your caregiver for specific rehydration instructions. Signs of dehydration may include:  Severe thirst.  Dry lips and mouth.  Dizziness.  Dark urine.  Decreasing urine frequency and amount.  Confusion.  Rapid breathing or pulse. SEEK IMMEDIATE MEDICAL CARE IF:   You have blood or brown flecks (like coffee grounds) in your vomit.  You have black or bloody stools.  You have a severe headache or stiff neck.  You are confused.  You have severe abdominal pain.  You have chest pain or trouble breathing.  You do not urinate at least once every 8 hours.  You develop cold or clammy skin.  You continue to vomit for longer than 24 to 48 hours.  You have a fever. MAKE SURE YOU:   Understand these instructions.  Will watch your condition.  Will get help right away if you are not doing well or get worse.   This information is not intended to replace advice given to you by your health care  provider. Make sure you discuss any questions you have with your health care provider.   Document Released: 08/04/2005 Document Revised: 10/27/2011 Document Reviewed: 01/01/2011 Elsevier Interactive Patient Education [employer].

## 2015-06-18 NOTE — Progress Notes (Signed)
  Oncology Nurse Navigator Documentation   Navigator Encounter Type: Treatment (06/18/15 5009) Patient Visit Type: Medonc (06/18/15 1555)       Interventions: Coordination of Care (06/18/15 1555)     Met with patient in Infusion following conversation in [facility] lobby during which he indicated he has appt with Dr. Redmond Baseman this Wed at 1:00 for evaluation of trach and potential removal.  To minimize transportation challenges on Wed, I arranged with Gretchen rescheduling of his Survivorship appt for 06/27/15, rescheduling of IVF with [facility] later in the afternoon. I relayed this information to patient in Infusion.  He verbalized understanding.  [name], RN, BSN, [place] at [place] [phone]             Time Spent with Patient: 15 (06/18/15 1555)

## 2015-06-18 NOTE — Telephone Encounter (Signed)
[facility] 

## 2015-06-18 NOTE — Progress Notes (Unsigned)
Survivorship Care Plan  Provided by Mike Craze, NP on 06/20/15     General Information  Patient name Marvin [name]   Patient ID [id number]   Date of birth Oct 04, 1975     Your Care Team  Patient Care Team: No Pcp Per Patient as PCP - General (General Practice) [name], MD as Consulting Physician (Otolaryngology) [name], MD as Attending Physician (Radiation Oncology) [name], MD as Consulting Physician (Hematology and Oncology) Leota Sauers, RN as Oncology Nurse Navigator Lenn Cal, DDS as Consulting Physician (Dentistry) Garald Balding, SLP - Speech Therapist Serafina Royals, PT - Physical Therapist Polo Riley, [place] Worker Dory Peru, [place] - Dietitian Mike Craze, NP - Survivorship Nurse Practitioner  *To reach your [facility] providers, call [phone].   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 Cancer Mother      Human Papilloma Virus (HPV) Negative  Tobacco use Previous smoker; 1 pack per day x 28 years; quit 2016  Alcohol use History of chronic alcohol abuse    Treatment Summary  Oncology History   Carcinoma of supraglottis   Staging form: Larynx - Supraglottis, AJCC 7th Edition     Clinical stage from 03/13/2015: Stage IVA (T3, N2b, M0) - Signed by [name], MD on 03/13/2015     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[place])   01/22/2015 Imaging CT scan showed enhancing infiltrative mass centered at the right piriform sinus with bilateral neck lymphadenopathy   02/22/2015 - 03/08/2015 Hospital Admission He was admitted to the hospital for management of advanced  supraglottic cancer status post tracheostomy, biopsy, dental extraction, placement of feeding tube and subsequent discharge to skilled nursing facility   02/22/2015 Pathology Results Accession: [id number] 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   03/09/2015 PET scan 1)  Right piriform sinus mass with right level 2 nodal metastasis.  2)  Hypermetabolic thoracic nodes which are at least partially felt to be reactive.  3)  No evidence of subdiaphragmatic metastatic disease.    03/16/2015 Procedure He has placement of port   03/21/2015 - 05/04/2015 Chemotherapy He received high dose cisplatin x 3 cycles   03/21/2015 - 05/09/2015 Radiation Therapy Tomotherapy to supraglottic tumor and bilateral neck:  70 Gy in 35 fractions to gross disease, 63 Gy in 35 fractions to high risk nodal echelons, and 56 Gy in 35 fractions to intermediate risk nodal echelons.   05/09/2015 Miscellaneous He went to the ED with fall         Treatment goal Curative    RADIATION THERAPY:  Location of Radiation Supraglottic tumor and bilateral neck  Radiation Period  Start Date: 03/21/15                     End Date: 05/09/15  Number of Radiation Treatments 35 treatments   Total Radiation Dose Supraglottic tumor: 70 Gy Neck lymph nodes (high risk): 63 Gy Neck lymph nodes (intermediate risk): 56 Gy   Potential Late & Long-Term Side Effects of Head & Neck Radiation Therapy  Fatigue  Dry mouth  Altered taste or loss of taste  Trouble swallowing  Jaw muscle spasms/pain  Skin changes in the area treated with radiation  Neck muscle spasms/pain  Lymphedema (swelling in the face and/or neck)  Hypothyroidism (sluggish thyroid gland)  ***Side effects from radiation therapy are dependent on the areas of the body in the treatment field.  These are the most common late/long-term effects for patients treated for  base of tongue cancer.     CHEMOTHERAPY: Chemotherapy Given With Radiation Therapy Yes   Drugs Received Date Started Date Completed # cycles (number of times drug was received) Comments  [x]   High-dose Cisplatin  03/21/15  05/04/15 3       Potential Late & Long-Term Side Effects of Chemotherapy   Fatigue  Neuropathy (numbness/tingling in hands or feet)  Hearing loss (Cisplatin only)  Infertility  Kidney damage/kidney failure  Liver problems  Rare secondary cancers     NUTRITIONAL SUPPORT: Placement of Feeding Tube Yes  Date of Placement 03/05/15  Expected Date of Removal When your weight is stable and you are able to tolerate adequate soft foods and liquids by mouth  Formula Type ***  Feeding Schedule Nocturnal (bedtime) feeding       Survivorship Care & Follow-up Schedule  *Becoming a cancer survivor is a time of great celebration, but also a time of many questions and concerns.  The following information and resources are for you, your caregivers, and your primary care doctor to better understand the needs of a cancer survivor.     How often will I see my cancer providers: Survivor Years 0-2  (Survivor Years defined by length of time since cancer diagnosis)   When?  Responsible Provider  Medical Oncology visits Every 1-3 months for first 6-9 months Dr. Alvy Bimler  Radiation Oncology visits Every 6 months Dr. Isidore Moos  Otolaryngologist (ENT) Every 3-6 months  Dr. Redmond Baseman  Dentist 1 month after treatment, then as needed Dr. Enrique Sack  Lab tests [facility]) Every 6-12 months Dr. Alvy Bimler or Dr. Isidore Moos  Scans/Imaging exams At 3-4 months after treatment Dr. Enid Derry. Isidore Moos  Nurse Navigator At 3 months, then 6 months after treatm [name], RN  Dietitian Monthly for first 6 months, then as needed Dory Peru, RD  Speech Therapist Every 2 months for 1st 6 months Garald Balding, SLP  Social Worker As needed Polo Riley, LCSW  Survivorship Nurse Practitioner (NP) 1-4 months after  treatment Mike Craze, NP         How often will I see my cancer providers: Survivor Years 3-5 (Survivor Years defined by length of time since cancer diagnosis)  When?  Responsible Provider  Radiation  Oncology visits 2 Years Dr. Isidore Moos  Dentist As directed Dr. Enrique Sack or your Primary Care Dentist   Survivorship Nurse Practitioner (NP) 2 Years, 6 Months Mike Craze, NP   3 Years Mike Craze, NP   4 Years Mike Craze, NP   5 Years: GRADUATION!  Mike Craze, NP  Lab tests (TSH) At 2 years, 2 years 6 months, 3 years, 4 years, & 5 years.  Dr. Ivonne Andrew, NP     Upcoming Appointments:   Appointment Date & Time Who am I seeing?   Medical Oncology  Dr. Alvy Bimler  Radiation  Oncology  Dr. Isidore Moos  ENT  Dr. Redmond Baseman  Dentist  Dr. Shaune Leeks & G-tube removal    Survivorship Nurse Practitioner (NP)  Mike Craze, NP  Dietitian  Dory Peru, RD  Speech Therapy  Garald Balding, SLP  Social Work  Polo Riley, LCSW  Physical Therapy  Serafina Royals, PT                 Common Complaints/Concerns of Cancer Survivors  *Patients often worry if what they are experiencing is normal, or to be expected, given their cancer history and treatment.  Below are common complaints & concerns reported by cancer survivors. If you are concerned about symptoms you are experiencing, please report all symptoms to your cancer care team!    Common Complaints/Concerns for Head & Neck Cancer Survivors   Fatigue  Memory problems and/or confusion  Depression  Anxiety  Dry mouth  Difficulty swallowing  Changes in your eating habits, your taste, or your smell  Weight changes  Hypothyroidism ("sluggish" thyroid)  Insomnia or trouble sleeping  Lymphedema (swelling in your neck or face)  Decreased range of motion (difficulty moving neck)  Intimacy and sexuality  Marital/partner/family relationships   Employment, health insurance concerns, and/or  finances  Concerns regarding spirituality  Exercise after cancer treatments                            Symptoms to Watch For and Report to Your Provider  *Often cancer survivors are fearful about their cancer coming back or being diagnosed with a new cancer.  Below are symptoms that you and your loved ones should watch for and report to your provider.   General Symptoms to Watch For and Report to Your Provider .  Return of the cancer symptoms you had before- such as a lump or new growth where your cancer first started . New or unusual pain that seems unrelated to an injury and does not go away, including back pain or bone pain . Weight loss without trying/intending . Unexplained bleeding . A rash or allergic reaction, such as swelling, severe itching, or wheezing . Chills or fevers . Persistent headaches . Shortness of breath or difficulty breathing . Bloody stools or blood in your urine . Lumps, bumps, swelling and/or nipple discharge . Nausea, vomiting, diarrhea, loss of appetite, or trouble swallowing . A cough that doesn't go away . Abdominal pain . Swelling in your arms or legs . Fractures . Any other signs mentioned by your doctor or nurse or any unusual symptoms                 that you just can't explain   NOTE: Just because you have certain symptoms, it doesn't mean the cancer has come back or you have a new cancer. Symptoms can be due to other problems that need to be addressed.  It is important to watch for these symptoms and report them to your provider so you can be medically evaluated for any of these concerns!      What Now?  Thriving & Surviving In Your Life After Cancer   Consider participating in "Finding Your New Normal" [facility]) survivorship series.   [employer] fitness program for cancer survivors.   Keep your follow-up appointments with all of your specialists (Speech Therapist, Dietician, Physical Therapist, Dentist,  etc.)  Consider FREE counseling at cancer center.  For more information, contact Ottis Stain at [phone].  Attend support group and other Patient & [place] class/program offerings.   Stop smoking or continue to abstain from smoking. If you need help with smoking cessation, talk to your healthcare team about different options to help you!  Limit alcohol  consumption or abstain from consuming alcohol all together.   Maintain adequate nutrition & fluid intake    Thank you so much for allowing [facility] to care for you during your cancer experience.  Our continued commitment is to you and your caregiver(s) health and happiness and again, congratulations on completing treatment & transitioning to survivorship!     Survivorship Nurse Practitioner contact:  Mike Craze, NP Survivorship Program [facility] [address] [place], [place] [address] [phone]

## 2015-06-19 ENCOUNTER — Ambulatory Visit (HOSPITAL_BASED_OUTPATIENT_CLINIC_OR_DEPARTMENT_OTHER): Payer: Medicaid Other

## 2015-06-19 ENCOUNTER — Telehealth (HOSPITAL_COMMUNITY): Payer: Self-pay

## 2015-06-19 VITALS — BP 119/81 | HR 70 | Temp 98.0°F | Resp 18

## 2015-06-19 DIAGNOSIS — E43 Unspecified severe protein-calorie malnutrition: Secondary | ICD-10-CM | POA: Diagnosis present

## 2015-06-19 DIAGNOSIS — C321 Malignant neoplasm of supraglottis: Secondary | ICD-10-CM

## 2015-06-19 MED ORDER — HEPARIN SOD (PORK) LOCK FLUSH 100 UNIT/ML IV SOLN
500.0000 [IU] | Freq: Once | INTRAVENOUS | Status: AC | PRN
  Administered 2015-06-19: 500 [IU]
  Filled 2015-06-19: qty 5

## 2015-06-19 MED ORDER — HYDROMORPHONE HCL 4 MG/ML IJ SOLN
2.0000 mg | INTRAMUSCULAR | Status: DC | PRN
  Administered 2015-06-19: 2 mg via INTRAVENOUS

## 2015-06-19 MED ORDER — SODIUM CHLORIDE 0.9 % IJ SOLN
10.0000 mL | INTRAMUSCULAR | Status: DC | PRN
  Administered 2015-06-19: 10 mL
  Filled 2015-06-19: qty 10

## 2015-06-19 MED ORDER — SODIUM CHLORIDE 0.9 % IV SOLN
Freq: Once | INTRAVENOUS | Status: AC
  Administered 2015-06-19: 13:00:00 via INTRAVENOUS

## 2015-06-19 MED ORDER — HYDROMORPHONE HCL 1 MG/ML IJ SOLN
2.0000 mg | INTRAMUSCULAR | Status: DC | PRN
  Administered 2015-06-19: 2 mg via INTRAVENOUS
  Filled 2015-06-19: qty 2

## 2015-06-19 MED ORDER — ONDANSETRON HCL 40 MG/20ML IJ SOLN
Freq: Once | INTRAMUSCULAR | Status: AC
  Administered 2015-06-19: 14:00:00 via INTRAVENOUS
  Filled 2015-06-19: qty 4

## 2015-06-19 MED ORDER — HYDROMORPHONE HCL 4 MG/ML IJ SOLN
INTRAMUSCULAR | Status: AC
  Filled 2015-06-19: qty 1

## 2015-06-19 NOTE — Telephone Encounter (Signed)
06/19/15           Called and left msg. for patient to call Dental Medicine regarding scheduling  F/U appt w/Dr. Enrique Sack.  LRI

## 2015-06-19 NOTE — Progress Notes (Signed)
Called Dr. Redmond Baseman office and confirmed pt has appt there on 11/3 at 9:40 am.  Pt is aware.  Pt had written down he has appt with Repiratory Therapy at [facility] tomorrow at 12 noon.  The appt is not showing up in our schedule in Epic, so I called over to [place] and s/w staff member in "[facility]" who confirmed pt has appt w/ them tomorrow at 12 noon.  He needs to check in at Admitting in [facility] a little before noon.  Pt is aware and appts written on his AVS.

## 2015-06-19 NOTE — Patient Instructions (Addendum)

## 2015-06-20 ENCOUNTER — Ambulatory Visit: Payer: Medicaid Other | Admitting: Nutrition

## 2015-06-20 ENCOUNTER — Ambulatory Visit (HOSPITAL_COMMUNITY)
Admission: RE | Admit: 2015-06-20 | Discharge: 2015-06-20 | Disposition: A | Discharge status: Home / Self Care | Payer: Medicaid Other | Source: Ambulatory Visit | Attending: Acute Care | Admitting: Acute Care

## 2015-06-20 ENCOUNTER — Ambulatory Visit (HOSPITAL_BASED_OUTPATIENT_CLINIC_OR_DEPARTMENT_OTHER): Payer: Medicaid Other

## 2015-06-20 ENCOUNTER — Ambulatory Visit (HOSPITAL_COMMUNITY)
Admission: RE | Admit: 2015-06-20 | Payer: Self-pay | Source: Ambulatory Visit | Attending: Otolaryngology | Admitting: Otolaryngology

## 2015-06-20 VITALS — BP 85/60 | HR 74 | Temp 98.4°F | Resp 18

## 2015-06-20 DIAGNOSIS — E43 Unspecified severe protein-calorie malnutrition: Secondary | ICD-10-CM

## 2015-06-20 DIAGNOSIS — C321 Malignant neoplasm of supraglottis: Secondary | ICD-10-CM | POA: Diagnosis not present

## 2015-06-20 DIAGNOSIS — Z930 Tracheostomy status: Secondary | ICD-10-CM | POA: Diagnosis not present

## 2015-06-20 MED ORDER — SODIUM CHLORIDE 0.9 % IJ SOLN
10.0000 mL | INTRAMUSCULAR | Status: DC | PRN
  Filled 2015-06-20: qty 10

## 2015-06-20 MED ORDER — HYDROMORPHONE HCL 4 MG/ML IJ SOLN
INTRAMUSCULAR | Status: AC
  Filled 2015-06-20: qty 1

## 2015-06-20 MED ORDER — HEPARIN SOD (PORK) LOCK FLUSH 100 UNIT/ML IV SOLN
500.0000 [IU] | Freq: Once | INTRAVENOUS | Status: DC | PRN
  Filled 2015-06-20: qty 5

## 2015-06-20 MED ORDER — SODIUM CHLORIDE 0.9 % IV SOLN
Freq: Once | INTRAVENOUS | Status: AC
  Administered 2015-06-20: 14:00:00 via INTRAVENOUS
  Filled 2015-06-20: qty 4

## 2015-06-20 MED ORDER — HYDROMORPHONE HCL 4 MG/ML IJ SOLN
2.0000 mg | INTRAMUSCULAR | Status: DC | PRN
  Administered 2015-06-20: 2 mg via INTRAVENOUS

## 2015-06-20 MED ORDER — ALTEPLASE 2 MG IJ SOLR
2.0000 mg | Freq: Once | INTRAMUSCULAR | Status: DC | PRN
  Filled 2015-06-20: qty 2

## 2015-06-20 MED ORDER — SODIUM CHLORIDE 0.9 % IV SOLN
Freq: Once | INTRAVENOUS | Status: AC
  Administered 2015-06-20: 14:00:00 via INTRAVENOUS

## 2015-06-20 NOTE — Consult Note (Signed)
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 From a cancer stand-point he is on observation status. He does require intermittent visits to the [facility] for IV hydration. From a general perspective he is eating, speaking w/ PMV and ambulated into the [facility]. He had last seen Dr Redmond Baseman ~ 1 week ago at which time they discussed possible decannulation. He his now here to assess for readiness to proceed w/ this.   Past Medical History  Diagnosis Date  . ETOH abuse   . Alcohol dependence ([place]) 04/17/2012  . COPD (chronic obstructive pulmonary disease) ([place])   . Shortness of breath dyspnea     occ  . Anxiety   . GERD (gastroesophageal reflux disease)   . Seizures ([place])     one episode 09/2012, suspected related to ETOH withdrawal  . Anxiety disorder 03/12/2015  . Headache 05/10/2015  . Hypokalemia 05/10/2015  . Anemia due to chemotherapy 05/11/2015   Social History   Social History  . Marital Status: Single    Spouse Name: N/A  . Number of Children: N/A  . Years of Education: N/A   Occupational History  . Not on file.   Social History Main Topics  . Smoking status: Former Smoker -- 1.00 packs/day for 28 years    Types: Cigarettes    Quit date: 01/17/2015  . Smokeless tobacco: Never Used  . Alcohol Use: 0.0 oz/week    0 Standard drinks or equivalent per week     Comment: 6 -40-oz. beers daily  . Drug Use: No  . Sexual Activity: Not on file   Other Topics Concern  . Not on file   Social History Narrative  No Known Allergies  Review of Systems:   Bolds are positive  Constitutional: weight loss-->starting to get appetite back, no night sweats, Fevers, chills, fatigue .  HEENT: headaches, Sore throat, sneezing, nasal congestion, post nasal drip, Difficulty swallowing, Tooth/dental  problems, visual complaints visual changes, ear ache. Trach w/out sig discharge but does have very foul smelling sputum.  CV:  chest pain, radiates: ,Orthopnea, PND, swelling in lower extremities, dizziness, palpitations, syncope.  GI  heartburn, indigestion, abdominal pain, nausea, vomiting, diarrhea, change in bowel habits, loss of appetite, bloody stools.  Resp: cough, productive: , hemoptysis, dyspnea, chest pain, pleuritic.  Skin: rash or itching or icterus GU: dysuria, change in color of urine, urgency or frequency. flank pain, hematuria  MS: joint pain or swelling. decreased range of motion  Psych: change in mood or affect. depression or anxiety.  Neuro: difficulty with speech, weakness, numbness, ataxia   EXAM  VS- BP-105/68, HR-89, RR-18, SpO2-99% on RA with PMV  General: this is a 39 year old male who presents to the trach clinic today for capping trials and assessment for decannulation. An occlusive cap was placed and his phonation was excellent and work of breathing unchanged. HENT: #6 cuffless trach mid-line. He does have fouls smelling tracheal secretions but he can easily cough this up and expectorate. His speech is a little muffled (which seems to be chronic).  Pulm: clear to auscultation and w/out accessory muscle use. Card: rrr, no MRG. Abd: soft, non-tender + bowel sounds. Ext: no edema, brisk CR.   Procedures Capped trach w/ occlusive trach X 30 minutes. Tolerated this very well. Following  this and d/w patient we went ahead and decannulated him.   Impression/Plan Tracheostomy status s/p supraglottic carcinoma s/p both chemo and radiation at the [facility] w/ Dr Alvy Bimler. From a cancer stand-point he is on observation status.  >now decannulated Plan F/u trach clinic PRN Instructed him that if stoma does not heal in next 6-8wks would need to see ENT again Home instructions reviewed Rest of f/u per Heme/Onc   Erick Colace ACNP-BC [place] Pager # [phone] OR # [phone] if no answer

## 2015-06-20 NOTE — Patient Instructions (Signed)

## 2015-06-20 NOTE — Treatment Plan (Signed)
     Today we decannulated you.   Instructions: -keep current dressing in place for next 24 hrs -after 24 hrs place a sterile bandage over stoma until completely closed.  -do not submerge yourself under water -routine skin care w/ soap and water around stoma site.  -may need to place finger over bandage to assist w/ voice quality for next 24-48hrs -please feel free to call trach clinic w/ questions or concerns  -if stoma not closed in 6-8 weeks would contact Dr Redmond Baseman.   Erick Colace ACNP-BC [place]

## 2015-06-20 NOTE — Progress Notes (Signed)
Tracheostomy Procedure Note  Marvin [name] [id number] 1976-03-26  Pre Procedure Tracheostomy Information  Trach Brand: Shiley Size: 6 Style: Uncuffed Secured by: Velcro  VS- BP-105/68, HR-89, RR-18, SpO2-99% on RA with PMV  Procedure: decannulation    Post Procedure Evaluation:   Vital signs:blood pressure 105/68, pulse 71, respirations 18 and pulse oximetry 98% Patients current condition: stable Complications: No apparent complications Trach site exam: clean Wound care done: Vaseline gauze Patient did tolerate procedure well.   Education: Vaseline gauze over stoma for the next 24 hours, after that regular band aid. DO NOT submerge stoma under water. Clean around stoma with soap/water  Prescription needs: None

## 2015-06-20 NOTE — Progress Notes (Signed)
Nutrition follow up completed with patient during IVF. Last weight documented as 128 pounds on October 21. Patient just had his trach removed.   Still follows a Dysphagia 1, pudding thick liquid diet. States he is eating well. Is tolerating 3 cans of Isosource daily via feeding tube. Denies nutrition impact symptoms.  Encouraged patient to continue high calories foods as tolerated. Recommended continue tube feedings to provide additional calories and protein. Recommend repeat swallowing evaluation to determine if it is safe for diet advancement. RD at [facility] following patient.

## 2015-06-21 ENCOUNTER — Other Ambulatory Visit: Payer: Self-pay | Admitting: Medical Oncology

## 2015-06-21 ENCOUNTER — Ambulatory Visit (HOSPITAL_BASED_OUTPATIENT_CLINIC_OR_DEPARTMENT_OTHER): Payer: Medicaid Other

## 2015-06-21 VITALS — BP 97/79 | HR 72 | Temp 97.7°F | Resp 19

## 2015-06-21 DIAGNOSIS — C321 Malignant neoplasm of supraglottis: Secondary | ICD-10-CM

## 2015-06-21 DIAGNOSIS — E43 Unspecified severe protein-calorie malnutrition: Secondary | ICD-10-CM | POA: Diagnosis present

## 2015-06-21 MED ORDER — HYDROMORPHONE HCL 4 MG/ML IJ SOLN
2.0000 mg | INTRAMUSCULAR | Status: DC | PRN
  Administered 2015-06-21: 2 mg via INTRAVENOUS

## 2015-06-21 MED ORDER — HEPARIN SOD (PORK) LOCK FLUSH 100 UNIT/ML IV SOLN
500.0000 [IU] | Freq: Once | INTRAVENOUS | Status: AC | PRN
Start: 2015-06-21 — End: 2015-06-21
  Administered 2015-06-21: 500 [IU]
  Filled 2015-06-21: qty 5

## 2015-06-21 MED ORDER — ONDANSETRON HCL 40 MG/20ML IJ SOLN
Freq: Once | INTRAMUSCULAR | Status: AC
  Administered 2015-06-21: 14:00:00 via INTRAVENOUS
  Filled 2015-06-21: qty 4

## 2015-06-21 MED ORDER — SODIUM CHLORIDE 0.9 % IV SOLN
Freq: Once | INTRAVENOUS | Status: AC
  Administered 2015-06-21: 14:00:00 via INTRAVENOUS

## 2015-06-21 MED ORDER — HYDROMORPHONE HCL 4 MG/ML IJ SOLN
INTRAMUSCULAR | Status: AC
  Filled 2015-06-21: qty 1

## 2015-06-21 MED ORDER — SODIUM CHLORIDE 0.9 % IJ SOLN
10.0000 mL | INTRAMUSCULAR | Status: DC | PRN
  Administered 2015-06-21: 10 mL
  Filled 2015-06-21: qty 10

## 2015-06-21 NOTE — Patient Instructions (Signed)
Dehydration, Adult Dehydration is when you lose more fluids from the body than you take in. Vital organs like the kidneys, brain, and heart cannot function without a proper amount of fluids and salt. Any loss of fluids from the body can cause dehydration.  CAUSES   Vomiting.  Diarrhea.  Excessive sweating.  Excessive urine output.  Fever. SYMPTOMS  Mild dehydration  Thirst.  Dry lips.  Slightly dry mouth. Moderate dehydration  Very dry mouth.  Sunken eyes.  Skin does not bounce back quickly when lightly pinched and released.  Dark urine and decreased urine production.  Decreased tear production.  Headache. Severe dehydration  Very dry mouth.  Extreme thirst.  Rapid, weak pulse (more than 100 beats per minute at rest).  Cold hands and feet.  Not able to sweat in spite of heat and temperature.  Rapid breathing.  Blue lips.  Confusion and lethargy.  Difficulty being awakened.  Minimal urine production.  No tears. DIAGNOSIS  Your caregiver will diagnose dehydration based on your symptoms and your exam. Blood and urine tests will help confirm the diagnosis. The diagnostic evaluation should also identify the cause of dehydration. TREATMENT  Treatment of mild or moderate dehydration can often be done at home by increasing the amount of fluids that you drink. It is best to drink small amounts of fluid more often. Drinking too much at one time can make vomiting worse. Refer to the home care instructions below. Severe dehydration needs to be treated at the hospital where you will probably be given intravenous (IV) fluids that contain water and electrolytes. HOME CARE INSTRUCTIONS   Ask your caregiver about specific rehydration instructions.  Drink enough fluids to keep your urine clear or pale yellow.  Drink small amounts frequently if you have nausea and vomiting.  Eat as you normally do.  Avoid:  Foods or drinks high in sugar.  Carbonated  drinks.  Juice.  Extremely hot or cold fluids.  Drinks with caffeine.  Fatty, greasy foods.  Alcohol.  Tobacco.  Overeating.  Gelatin desserts.  Wash your hands well to avoid spreading bacteria and viruses.  Only take over-the-counter or prescription medicines for pain, discomfort, or fever as directed by your caregiver.  Ask your caregiver if you should continue all prescribed and over-the-counter medicines.  Keep all follow-up appointments with your caregiver. SEEK MEDICAL CARE IF:  You have abdominal pain and it increases or stays in one area (localizes).  You have a rash, stiff neck, or severe headache.  You are irritable, sleepy, or difficult to awaken.  You are weak, dizzy, or extremely thirsty. SEEK IMMEDIATE MEDICAL CARE IF:   You are unable to keep fluids down or you get worse despite treatment.  You have frequent episodes of vomiting or diarrhea.  You have blood or green matter (bile) in your vomit.  You have blood in your stool or your stool looks black and tarry.  You have not urinated in 6 to 8 hours, or you have only urinated a small amount of very dark urine.  You have a fever.  You faint. MAKE SURE YOU:   Understand these instructions.  Will watch your condition.  Will get help right away if you are not doing well or get worse. Document Released: 08/04/2005 Document Revised: 10/27/2011 Document Reviewed: 03/24/2011 ExitCare Patient Information 2015 [employer]. This information is not intended to replace advice given to you by your health care provider. Make sure you discuss any questions you have with your health care   provider.  

## 2015-06-22 ENCOUNTER — Other Ambulatory Visit: Payer: Self-pay | Admitting: Hematology and Oncology

## 2015-06-22 ENCOUNTER — Ambulatory Visit (HOSPITAL_BASED_OUTPATIENT_CLINIC_OR_DEPARTMENT_OTHER): Payer: Medicaid Other

## 2015-06-22 VITALS — BP 89/65 | HR 96 | Temp 98.4°F | Resp 18

## 2015-06-22 DIAGNOSIS — E43 Unspecified severe protein-calorie malnutrition: Secondary | ICD-10-CM | POA: Diagnosis present

## 2015-06-22 DIAGNOSIS — C321 Malignant neoplasm of supraglottis: Secondary | ICD-10-CM | POA: Diagnosis not present

## 2015-06-22 MED ORDER — HYDROMORPHONE HCL 4 MG/ML IJ SOLN
INTRAMUSCULAR | Status: AC
  Filled 2015-06-22: qty 1

## 2015-06-22 MED ORDER — SODIUM CHLORIDE 0.9 % IV SOLN
Freq: Once | INTRAVENOUS | Status: AC
  Administered 2015-06-22: 13:00:00 via INTRAVENOUS

## 2015-06-22 MED ORDER — ONDANSETRON HCL 40 MG/20ML IJ SOLN
Freq: Once | INTRAMUSCULAR | Status: AC
  Administered 2015-06-22: 13:00:00 via INTRAVENOUS
  Filled 2015-06-22: qty 4

## 2015-06-22 MED ORDER — SODIUM CHLORIDE 0.9 % IJ SOLN
10.0000 mL | INTRAMUSCULAR | Status: DC | PRN
  Administered 2015-06-22: 10 mL
  Filled 2015-06-22: qty 10

## 2015-06-22 MED ORDER — HYDROMORPHONE HCL 4 MG/ML IJ SOLN: 2.0000 mg | INTRAMUSCULAR | Status: DC | PRN

## 2015-06-22 MED ORDER — HYDROMORPHONE HCL 4 MG/ML IJ SOLN
2.0000 mg | INTRAMUSCULAR | Status: DC | PRN
  Administered 2015-06-22 (×2): 2 mg via INTRAVENOUS

## 2015-06-22 MED ORDER — HEPARIN SOD (PORK) LOCK FLUSH 100 UNIT/ML IV SOLN
500.0000 [IU] | Freq: Once | INTRAVENOUS | Status: AC | PRN
  Administered 2015-06-22: 500 [IU]
  Filled 2015-06-22: qty 5

## 2015-06-22 NOTE — Patient Instructions (Signed)

## 2015-06-23 ENCOUNTER — Ambulatory Visit (HOSPITAL_BASED_OUTPATIENT_CLINIC_OR_DEPARTMENT_OTHER): Payer: Medicaid Other

## 2015-06-23 VITALS — BP 91/50 | HR 73 | Temp 97.2°F | Resp 20

## 2015-06-23 DIAGNOSIS — E43 Unspecified severe protein-calorie malnutrition: Secondary | ICD-10-CM | POA: Diagnosis present

## 2015-06-23 DIAGNOSIS — C321 Malignant neoplasm of supraglottis: Secondary | ICD-10-CM

## 2015-06-23 MED ORDER — HYDROMORPHONE HCL 4 MG/ML IJ SOLN
INTRAMUSCULAR | Status: AC
  Filled 2015-06-23: qty 1

## 2015-06-23 MED ORDER — HYDROMORPHONE HCL 1 MG/ML IJ SOLN
2.0000 mg | INTRAMUSCULAR | Status: DC | PRN
  Administered 2015-06-23: 2 mg via INTRAVENOUS

## 2015-06-23 MED ORDER — SODIUM CHLORIDE 0.9 % IV SOLN
Freq: Once | INTRAVENOUS | Status: AC
  Administered 2015-06-23: 08:00:00 via INTRAVENOUS

## 2015-06-23 MED ORDER — SODIUM CHLORIDE 0.9 % IJ SOLN
10.0000 mL | INTRAMUSCULAR | Status: DC | PRN
  Administered 2015-06-23: 10 mL
  Filled 2015-06-23: qty 10

## 2015-06-23 MED ORDER — ONDANSETRON HCL 40 MG/20ML IJ SOLN
Freq: Once | INTRAMUSCULAR | Status: AC
  Administered 2015-06-23: 08:00:00 via INTRAVENOUS

## 2015-06-23 MED ORDER — HYDROMORPHONE HCL 1 MG/ML IJ SOLN
2.0000 mg | INTRAMUSCULAR | Status: DC | PRN
Start: 2015-06-23 — End: 2015-06-23
  Administered 2015-06-23: 2 mg via INTRAVENOUS

## 2015-06-23 MED ORDER — HEPARIN SOD (PORK) LOCK FLUSH 100 UNIT/ML IV SOLN
500.0000 [IU] | Freq: Once | INTRAVENOUS | Status: AC | PRN
  Administered 2015-06-23: 500 [IU]
  Filled 2015-06-23: qty 5

## 2015-06-25 ENCOUNTER — Ambulatory Visit (HOSPITAL_BASED_OUTPATIENT_CLINIC_OR_DEPARTMENT_OTHER): Payer: Medicaid Other

## 2015-06-25 VITALS — BP 92/72 | HR 104 | Temp 98.0°F | Resp 18

## 2015-06-25 DIAGNOSIS — C321 Malignant neoplasm of supraglottis: Secondary | ICD-10-CM

## 2015-06-25 DIAGNOSIS — E43 Unspecified severe protein-calorie malnutrition: Secondary | ICD-10-CM

## 2015-06-25 MED ORDER — SODIUM CHLORIDE 0.9 % IV SOLN
Freq: Once | INTRAVENOUS | Status: AC
  Administered 2015-06-25: 14:00:00 via INTRAVENOUS
  Filled 2015-06-25: qty 4

## 2015-06-25 MED ORDER — SODIUM CHLORIDE 0.9 % IV SOLN
Freq: Once | INTRAVENOUS | Status: AC
  Administered 2015-06-25: 14:00:00 via INTRAVENOUS

## 2015-06-25 MED ORDER — HYDROMORPHONE HCL 4 MG/ML IJ SOLN
INTRAMUSCULAR | Status: AC
  Filled 2015-06-25: qty 1

## 2015-06-25 MED ORDER — HYDROMORPHONE HCL 4 MG/ML IJ SOLN
2.0000 mg | INTRAMUSCULAR | Status: DC | PRN
  Administered 2015-06-25: 2 mg via INTRAVENOUS

## 2015-06-25 MED ORDER — SODIUM CHLORIDE 0.9 % IJ SOLN
10.0000 mL | INTRAMUSCULAR | Status: DC | PRN
  Filled 2015-06-25: qty 10

## 2015-06-25 MED ORDER — HEPARIN SOD (PORK) LOCK FLUSH 100 UNIT/ML IV SOLN
500.0000 [IU] | Freq: Once | INTRAVENOUS | Status: DC | PRN
  Filled 2015-06-25: qty 5

## 2015-06-25 NOTE — Patient Instructions (Signed)

## 2015-06-26 ENCOUNTER — Ambulatory Visit (HOSPITAL_BASED_OUTPATIENT_CLINIC_OR_DEPARTMENT_OTHER): Payer: Medicaid Other

## 2015-06-26 VITALS — BP 78/57 | HR 88 | Temp 98.4°F | Resp 18

## 2015-06-26 DIAGNOSIS — C321 Malignant neoplasm of supraglottis: Secondary | ICD-10-CM | POA: Diagnosis not present

## 2015-06-26 DIAGNOSIS — E43 Unspecified severe protein-calorie malnutrition: Secondary | ICD-10-CM | POA: Diagnosis not present

## 2015-06-26 MED ORDER — HYDROMORPHONE HCL 4 MG/ML IJ SOLN
INTRAMUSCULAR | Status: AC
  Filled 2015-06-26: qty 1

## 2015-06-26 MED ORDER — SODIUM CHLORIDE 0.9 % IV SOLN
Freq: Once | INTRAVENOUS | Status: AC
  Administered 2015-06-26 (×2): via INTRAVENOUS

## 2015-06-26 MED ORDER — HYDROMORPHONE HCL 4 MG/ML IJ SOLN
2.0000 mg | INTRAMUSCULAR | Status: DC | PRN
  Administered 2015-06-26: 2 mg via INTRAVENOUS

## 2015-06-26 MED ORDER — SODIUM CHLORIDE 0.9 % IJ SOLN
10.0000 mL | INTRAMUSCULAR | Status: DC | PRN
  Administered 2015-06-26: 10 mL
  Filled 2015-06-26: qty 10

## 2015-06-26 MED ORDER — SODIUM CHLORIDE 0.9 % IV SOLN
Freq: Once | INTRAVENOUS | Status: AC
  Administered 2015-06-26: 14:00:00 via INTRAVENOUS
  Filled 2015-06-26: qty 4

## 2015-06-26 MED ORDER — HEPARIN SOD (PORK) LOCK FLUSH 100 UNIT/ML IV SOLN
500.0000 [IU] | Freq: Once | INTRAVENOUS | Status: DC | PRN
  Filled 2015-06-26: qty 5

## 2015-06-26 NOTE — Patient Instructions (Signed)

## 2015-06-27 ENCOUNTER — Ambulatory Visit (HOSPITAL_BASED_OUTPATIENT_CLINIC_OR_DEPARTMENT_OTHER): Payer: Medicaid Other

## 2015-06-27 VITALS — BP 129/80 | HR 70 | Temp 98.6°F | Resp 16

## 2015-06-27 DIAGNOSIS — C321 Malignant neoplasm of supraglottis: Secondary | ICD-10-CM

## 2015-06-27 DIAGNOSIS — E43 Unspecified severe protein-calorie malnutrition: Secondary | ICD-10-CM

## 2015-06-27 MED ORDER — HEPARIN SOD (PORK) LOCK FLUSH 100 UNIT/ML IV SOLN
500.0000 [IU] | Freq: Once | INTRAVENOUS | Status: AC | PRN
  Administered 2015-06-27: 500 [IU]
  Filled 2015-06-27: qty 5

## 2015-06-27 MED ORDER — SODIUM CHLORIDE 0.9 % IV SOLN
Freq: Once | INTRAVENOUS | Status: AC
  Administered 2015-06-27: 15:00:00 via INTRAVENOUS

## 2015-06-27 MED ORDER — HYDROMORPHONE HCL 4 MG/ML IJ SOLN
INTRAMUSCULAR | Status: AC
  Filled 2015-06-27: qty 1

## 2015-06-27 MED ORDER — SODIUM CHLORIDE 0.9 % IV SOLN
Freq: Once | INTRAVENOUS | Status: AC
  Administered 2015-06-27: 15:00:00 via INTRAVENOUS
  Filled 2015-06-27: qty 4

## 2015-06-27 MED ORDER — SODIUM CHLORIDE 0.9 % IJ SOLN
10.0000 mL | INTRAMUSCULAR | Status: DC | PRN
  Administered 2015-06-27: 10 mL
  Filled 2015-06-27: qty 10

## 2015-06-27 MED ORDER — HYDROMORPHONE HCL 4 MG/ML IJ SOLN
2.0000 mg | INTRAMUSCULAR | Status: DC | PRN
  Administered 2015-06-27: 2 mg via INTRAVENOUS

## 2015-06-27 NOTE — Patient Instructions (Signed)
Dehydration, Adult Dehydration is when you lose more fluids from the body than you take in. Vital organs like the kidneys, brain, and heart cannot function without a proper amount of fluids and salt. Any loss of fluids from the body can cause dehydration.  CAUSES   Vomiting.  Diarrhea.  Excessive sweating.  Excessive urine output.  Fever. SYMPTOMS  Mild dehydration  Thirst.  Dry lips.  Slightly dry mouth. Moderate dehydration  Very dry mouth.  Sunken eyes.  Skin does not bounce back quickly when lightly pinched and released.  Dark urine and decreased urine production.  Decreased tear production.  Headache. Severe dehydration  Very dry mouth.  Extreme thirst.  Rapid, weak pulse (more than 100 beats per minute at rest).  Cold hands and feet.  Not able to sweat in spite of heat and temperature.  Rapid breathing.  Blue lips.  Confusion and lethargy.  Difficulty being awakened.  Minimal urine production.  No tears. DIAGNOSIS  Your caregiver will diagnose dehydration based on your symptoms and your exam. Blood and urine tests will help confirm the diagnosis. The diagnostic evaluation should also identify the cause of dehydration. TREATMENT  Treatment of mild or moderate dehydration can often be done at home by increasing the amount of fluids that you drink. It is best to drink small amounts of fluid more often. Drinking too much at one time can make vomiting worse. Refer to the home care instructions below. Severe dehydration needs to be treated at the hospital where you will probably be given intravenous (IV) fluids that contain water and electrolytes. HOME CARE INSTRUCTIONS   Ask your caregiver about specific rehydration instructions.  Drink enough fluids to keep your urine clear or pale yellow.  Drink small amounts frequently if you have nausea and vomiting.  Eat as you normally do.  Avoid:  Foods or drinks high in sugar.  Carbonated  drinks.  Juice.  Extremely hot or cold fluids.  Drinks with caffeine.  Fatty, greasy foods.  Alcohol.  Tobacco.  Overeating.  Gelatin desserts.  Wash your hands well to avoid spreading bacteria and viruses.  Only take over-the-counter or prescription medicines for pain, discomfort, or fever as directed by your caregiver.  Ask your caregiver if you should continue all prescribed and over-the-counter medicines.  Keep all follow-up appointments with your caregiver. SEEK MEDICAL CARE IF:  You have abdominal pain and it increases or stays in one area (localizes).  You have a rash, stiff neck, or severe headache.  You are irritable, sleepy, or difficult to awaken.  You are weak, dizzy, or extremely thirsty. SEEK IMMEDIATE MEDICAL CARE IF:   You are unable to keep fluids down or you get worse despite treatment.  You have frequent episodes of vomiting or diarrhea.  You have blood or green matter (bile) in your vomit.  You have blood in your stool or your stool looks black and tarry.  You have not urinated in 6 to 8 hours, or you have only urinated a small amount of very dark urine.  You have a fever.  You faint. MAKE SURE YOU:   Understand these instructions.  Will watch your condition.  Will get help right away if you are not doing well or get worse. Document Released: 08/04/2005 Document Revised: 10/27/2011 Document Reviewed: 03/24/2011 ExitCare Patient Information 2015 [employer]. This information is not intended to replace advice given to you by your health care provider. Make sure you discuss any questions you have with your health care   provider.  

## 2015-06-28 ENCOUNTER — Other Ambulatory Visit: Payer: Self-pay | Admitting: *Deleted

## 2015-06-28 ENCOUNTER — Encounter: Payer: Self-pay | Admitting: *Deleted

## 2015-06-28 ENCOUNTER — Ambulatory Visit (HOSPITAL_BASED_OUTPATIENT_CLINIC_OR_DEPARTMENT_OTHER): Payer: Medicaid Other

## 2015-06-28 ENCOUNTER — Telehealth: Payer: Self-pay | Admitting: Hematology and Oncology

## 2015-06-28 VITALS — BP 140/83 | HR 54 | Temp 98.9°F | Resp 16

## 2015-06-28 DIAGNOSIS — E43 Unspecified severe protein-calorie malnutrition: Secondary | ICD-10-CM | POA: Diagnosis not present

## 2015-06-28 DIAGNOSIS — C321 Malignant neoplasm of supraglottis: Secondary | ICD-10-CM

## 2015-06-28 MED ORDER — SODIUM CHLORIDE 0.9 % IV SOLN
Freq: Once | INTRAVENOUS | Status: AC
  Administered 2015-06-28: 14:00:00 via INTRAVENOUS
  Filled 2015-06-28: qty 4

## 2015-06-28 MED ORDER — HYDROMORPHONE HCL 1 MG/ML IJ SOLN
2.0000 mg | INTRAMUSCULAR | Status: DC | PRN
Start: 2015-06-28 — End: 2015-06-28
  Administered 2015-06-28: 2 mg via INTRAVENOUS
  Filled 2015-06-28: qty 2

## 2015-06-28 MED ORDER — HEPARIN SOD (PORK) LOCK FLUSH 100 UNIT/ML IV SOLN
500.0000 [IU] | Freq: Once | INTRAVENOUS | Status: DC | PRN
  Filled 2015-06-28: qty 5

## 2015-06-28 MED ORDER — HEPARIN SOD (PORK) LOCK FLUSH 100 UNIT/ML IV SOLN
500.0000 [IU] | Freq: Once | INTRAVENOUS | Status: AC
  Administered 2015-06-28: 500 [IU]
  Filled 2015-06-28: qty 5

## 2015-06-28 MED ORDER — SODIUM CHLORIDE 0.9 % IJ SOLN
10.0000 mL | INTRAMUSCULAR | Status: DC | PRN
  Filled 2015-06-28: qty 10

## 2015-06-28 MED ORDER — ALTEPLASE 2 MG IJ SOLR
2.0000 mg | Freq: Once | INTRAMUSCULAR | Status: DC | PRN
  Filled 2015-06-28: qty 2

## 2015-06-28 MED ORDER — SODIUM CHLORIDE 0.9 % IJ SOLN
10.0000 mL | Freq: Once | INTRAMUSCULAR | Status: AC
  Administered 2015-06-28: 10 mL
  Filled 2015-06-28: qty 10

## 2015-06-28 MED ORDER — HYDROMORPHONE HCL 4 MG/ML IJ SOLN
INTRAMUSCULAR | Status: AC
  Filled 2015-06-28: qty 1

## 2015-06-28 MED ORDER — SODIUM CHLORIDE 0.9 % IV SOLN
Freq: Once | INTRAVENOUS | Status: AC
  Administered 2015-06-28: 14:00:00 via INTRAVENOUS

## 2015-06-28 NOTE — Patient Instructions (Signed)

## 2015-06-28 NOTE — Telephone Encounter (Signed)
Appointments made and patient will get a new schedule/avs in chemo.  Closer to time we may still need to call sickle cell for 11/17

## 2015-06-29 ENCOUNTER — Ambulatory Visit (HOSPITAL_BASED_OUTPATIENT_CLINIC_OR_DEPARTMENT_OTHER): Payer: Medicaid Other | Admitting: Hematology and Oncology

## 2015-06-29 ENCOUNTER — Ambulatory Visit (HOSPITAL_BASED_OUTPATIENT_CLINIC_OR_DEPARTMENT_OTHER): Payer: Medicaid Other

## 2015-06-29 ENCOUNTER — Telehealth: Payer: Self-pay | Admitting: Hematology and Oncology

## 2015-06-29 ENCOUNTER — Encounter: Payer: Self-pay | Admitting: Hematology and Oncology

## 2015-06-29 VITALS — BP 90/63 | HR 90 | Temp 98.1°F | Resp 18 | Ht 73.0 in | Wt 130.2 lb

## 2015-06-29 DIAGNOSIS — E860 Dehydration: Secondary | ICD-10-CM

## 2015-06-29 DIAGNOSIS — E43 Unspecified severe protein-calorie malnutrition: Secondary | ICD-10-CM

## 2015-06-29 DIAGNOSIS — K9281 Gastrointestinal mucositis (ulcerative): Secondary | ICD-10-CM

## 2015-06-29 DIAGNOSIS — Z931 Gastrostomy status: Secondary | ICD-10-CM | POA: Diagnosis not present

## 2015-06-29 DIAGNOSIS — C321 Malignant neoplasm of supraglottis: Secondary | ICD-10-CM

## 2015-06-29 DIAGNOSIS — K9429 Other complications of gastrostomy: Secondary | ICD-10-CM

## 2015-06-29 MED ORDER — SODIUM CHLORIDE 0.9 % IJ SOLN
10.0000 mL | INTRAMUSCULAR | Status: DC | PRN
  Administered 2015-06-29: 10 mL
  Filled 2015-06-29: qty 10

## 2015-06-29 MED ORDER — SODIUM CHLORIDE 0.9 % IV SOLN
Freq: Once | INTRAVENOUS | Status: AC
  Administered 2015-06-29: 13:00:00 via INTRAVENOUS

## 2015-06-29 MED ORDER — SODIUM CHLORIDE 0.9 % IV SOLN
Freq: Once | INTRAVENOUS | Status: AC
  Administered 2015-06-29: 13:00:00 via INTRAVENOUS
  Filled 2015-06-29: qty 4

## 2015-06-29 MED ORDER — HYDROMORPHONE HCL 4 MG/ML IJ SOLN
2.0000 mg | INTRAMUSCULAR | Status: DC | PRN
  Administered 2015-06-29: 2 mg via INTRAVENOUS

## 2015-06-29 MED ORDER — HYDROMORPHONE HCL 4 MG/ML IJ SOLN
INTRAMUSCULAR | Status: AC
  Filled 2015-06-29: qty 1

## 2015-06-29 MED ORDER — HYDROMORPHONE HCL 4 MG PO TABS: ORAL_TABLET | ORAL | Status: DC

## 2015-06-29 MED ORDER — HEPARIN SOD (PORK) LOCK FLUSH 100 UNIT/ML IV SOLN
500.0000 [IU] | Freq: Once | INTRAVENOUS | Status: AC | PRN
  Administered 2015-06-29: 500 [IU]
  Filled 2015-06-29: qty 5

## 2015-06-29 NOTE — Assessment & Plan Note (Signed)
He tolerated treatment very well with objective response to treatment, with resolution of the lymphadenopathy. I will continue aggressive IV fluid support for him For the next 3 weeks. Recently, I stopped giving him IV fluids and he became very hypotensive. He has appointment to go back to ENT in the near future.  He has the tracehostomy removed recently and started to gain weight.

## 2015-06-29 NOTE — Assessment & Plan Note (Signed)
His pain is poorly controlled. He felt that the Dilaudid is helpful. I gave him prescription Dilaudid to take before each meals and will reassess his pain visit.

## 2015-06-29 NOTE — Assessment & Plan Note (Signed)
Clinically, he is symptomatic with signs of dehydration, hypotension and tachycardia. I will give him 2 L of IV fluids daily for 3 weeks. 

## 2015-06-29 NOTE — Progress Notes (Signed)
[place] OFFICE PROGRESS NOTE  Patient Care Team: No Pcp Per Patient as PCP - General (General Practice) [name], MD as Consulting Physician (Otolaryngology) [name], MD as Attending Physician (Radiation Oncology) [name], MD as Consulting Physician (Hematology and Oncology) Leota Sauers, RN as Oncology Nurse Navigator Lenn Cal, DDS as Consulting Physician (Dentistry)  SUMMARY OF ONCOLOGIC HISTORY: Oncology History   Carcinoma of supraglottis   Staging form: Larynx - Supraglottis, AJCC 7th Edition     Clinical stage from 03/13/2015: Stage IVA (T3, N2b, M0) - Signed by [name], MD on 03/13/2015     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[place])   01/22/2015 Imaging CT scan showed enhancing infiltrative mass centered at the right piriform sinus with bilateral neck lymphadenopathy   02/22/2015 - 03/08/2015 Hospital Admission He was admitted to the hospital for management of advanced supraglottic cancer status post tracheostomy, biopsy, dental extraction, placement of feeding tube and subsequent discharge to skilled nursing facility   02/22/2015 Pathology Results Accession: [id number] 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   03/09/2015 PET scan 1)  Right piriform sinus mass with right level 2 nodal metastasis.  2)  Hypermetabolic thoracic nodes which are at least partially felt to be reactive.  3)  No evidence of subdiaphragmatic metastatic disease.    03/16/2015 Procedure He has placement of port   03/21/2015 - 05/04/2015 Chemotherapy He received high dose cisplatin x 3 cycles   03/21/2015 - 05/09/2015 Radiation Therapy Tomotherapy to supraglottic tumor and bilateral neck:  70 Gy in 35 fractions to gross disease, 63 Gy in 35 fractions to high risk nodal echelons, and 56 Gy in 35 fractions to intermediate risk nodal echelons.   05/09/2015 Miscellaneous He went  to the ED with fall    INTERVAL HISTORY: Please see below for problem oriented charting. He is seen urgently today with clinical hypotension. He started to have significant dizziness today  Over the weekend, he stated he has been getting adequate nutritional supplement through his feeding tube and he does not feel dehydrated. Denies recent diarrhea. He is not on any medications that could cause low blood pressure. His tracheostomy was removed last week. He felt a bit better. He still has significant throat pain  REVIEW OF SYSTEMS:   Constitutional: Denies fevers, chills or abnormal weight loss Eyes: Denies blurriness of vision Respiratory: Denies cough, dyspnea or wheezes Cardiovascular: Denies palpitation, chest discomfort or lower extremity swelling Gastrointestinal:  Denies nausea, heartburn or change in bowel habits Skin: Denies abnormal skin rashes Lymphatics: Denies new lymphadenopathy or easy bruising Neurological:Denies numbness, tingling or new weaknesses Behavioral/Psych: Mood is stable, no new changes  All other systems were reviewed with the patient and are negative.  I have reviewed the past medical history, past surgical history, social history and family history with the patient and they are unchanged from previous note.  ALLERGIES:  has No Known Allergies.  MEDICATIONS:  Current Outpatient Prescriptions  Medication Sig Dispense Refill  . bisacodyl (DULCOLAX) 10 MG suppository Place 10 mg rectally as needed for moderate constipation.    . citalopram (CELEXA) 20 MG tablet 20 mg by PEG Tube route daily.    . fentaNYL (DURAGESIC - DOSED MCG/HR) 50 MCG/HR Apply one patch topically every 72 hours for pain. Remove old patch. External use only. 10 patch 0  . folic acid (FOLVITE) 1 MG tablet Take 1 tablet (1 mg  total) by mouth daily. 30 tablet 0  . HYDROcodone-acetaminophen (HYCET) 7.5-325 mg/15 ml solution Give 15 mL per g/t every 6 hours as needed for pain    .  HYDROmorphone (DILAUDID) 4 MG tablet Take 1 tablets 30 minutes before meals, up to maximum 6 tablets per day if needed 60 tablet 0  . ibuprofen (ADVIL,MOTRIN) 100 MG/5ML suspension Take 200-400 mg by mouth every 4 (four) hours as needed for mild pain.     Marland Kitchen lidocaine (XYLOCAINE) 2 % solution Mix 1 part 2%viscous lidocaine,1part H2O.Swish and/or swallow 44ml of this mixture,67min before meals and at bedtime, up to QID (Patient taking differently: Mix 1 part 2%viscous lidocaine,1part H2O.Swish and/or swallow 58ml of this mixture,79min before meals and at bedtime, up to QID as needed for sore throat) 100 mL 5  . lidocaine-prilocaine (EMLA) cream Apply 1 application topically as needed (FOR PORT ACCESS).    . LORazepam (ATIVAN) 1 MG tablet Take 0.5 mg by mouth 3 (three) times daily.     . magnesium hydroxide (MILK OF MAGNESIA) 400 MG/5ML suspension Take 30 mLs by mouth daily as needed for mild constipation.    Marland Kitchen morphine (ROXANOL) 20 MG/ML concentrated solution Take 1 mL (20 mg total) by mouth every 2 (two) hours as needed for moderate pain or severe pain. 240 mL 0  . Multiple Vitamin (MULTIVITAMIN WITH MINERALS) TABS tablet Take 1 tablet by mouth daily. 30 tablet 0  . omeprazole (PRILOSEC) 20 MG capsule 20 mg by PEG Tube route daily.    . ondansetron (ZOFRAN) 8 MG tablet 8 mg by PEG Tube route every 8 (eight) hours as needed for nausea or vomiting.    . pravastatin (PRAVACHOL) 20 MG tablet 20 mg by PEG Tube route daily.    . prochlorperazine (COMPAZINE) 10 MG tablet 10 mg by PEG Tube route every 6 (six) hours as needed for nausea or vomiting.    . sodium fluoride (PREVIDENT 5000 PLUS) 1.1 % CREA dental cream Apply thin ribbon of cream to tooth brush. Brush teeth for 2 minutes. Spit out excess-DO NOT swallow. DO NOT rinse afterwards. Repeat nightly. 1 Tube prn  . sodium phosphate (FLEET) enema Place 1 enema rectally once as needed (for constipation). follow package directions    . sucralfate (CARAFATE) 1 G  tablet Dissolve 1 tablet in 29ml H2O and swallow up to QID,PRN sore throat. 60 tablet 5  . thiamine 100 MG tablet Take 1 tablet (100 mg total) by mouth daily. 30 tablet 0   No current facility-administered medications for this visit.   Facility-Administered Medications Ordered in Other Visits  Medication Dose Route Frequency Provider Last Rate Last Dose  . 0.9 %  sodium chloride infusion   Intravenous Once [name], MD      . 0.9 %  sodium chloride infusion   Intravenous Once [name], MD      . heparin lock flush 100 unit/mL  500 Units Intracatheter Once PRN [name], MD      . HYDROmorphone (DILAUDID) injection 2 mg  2 mg Intravenous Q2H PRN [name], MD   2 mg at 06/29/15 1306  . ondansetron (ZOFRAN) 8 mg in sodium chloride 0.9 % 50 mL IVPB   Intravenous Once [name], MD      . ondansetron (ZOFRAN) 8 mg in sodium chloride 0.9 % 50 mL IVPB   Intravenous Once [name], MD      . ondansetron (ZOFRAN) 8 mg in sodium chloride 0.9 % 50 mL IVPB  Intravenous Once [name], MD      . ondansetron (ZOFRAN) 8 mg in sodium chloride 0.9 % 50 mL IVPB   Intravenous Once [name], MD      . sodium chloride 0.9 % injection 10 mL  10 mL Intracatheter PRN [name], MD      . sodium chloride 0.9 % injection 10 mL  10 mL Intracatheter PRN [name], MD      . sodium chloride 0.9 % injection 10 mL  10 mL Intracatheter PRN [name], MD        PHYSICAL EXAMINATION: ECOG PERFORMANCE STATUS: 1 - Symptomatic but completely ambulatory  Filed Vitals:   06/29/15 1208  BP: 90/63  Pulse: 90  Temp: 98.1 F (36.7 C)  Resp: 18   Filed Weights   06/29/15 1208  Weight: 130 lb 3.2 oz (59.058 kg)    GENERAL:alert, no distress and comfortable SKIN: skin color, texture, turgor are normal, no rashes or significant lesions EYES: normal, Conjunctiva are pink and non-injected, sclera clear OROPHARYNX:no exudate, no erythema and lips, buccal mucosa, and tongue normal  NECK:the tracheostomy site is  clean. No bleeding is noted. LYMPH:  no palpable lymphadenopathy in the cervical, axillary or inguinal LUNGS: clear to auscultation and percussion with normal breathing effort HEART: regular rate & rhythm and no murmurs and no lower extremity edema ABDOMEN:abdomen soft, non-tender and normal bowel sounds Musculoskeletal:no cyanosis of digits and no clubbing  NEURO: alert & oriented x 3 with fluent speech, no focal motor/sensory deficits  LABORATORY DATA:  I have reviewed the data as listed    Component Value Date/Time   NA 138 06/11/2015 1005   NA 137 03/15/2015   NA 135 02/14/2015 1439   K 4.2 06/11/2015 1005   K 4.4 03/15/2015   CL 100* 02/14/2015 1439   CO2 27 06/11/2015 1005   CO2 24 02/14/2015 1439   GLUCOSE 108 06/11/2015 1005   GLUCOSE 81 02/14/2015 1439   BUN 12.9 06/11/2015 1005   BUN 9 03/15/2015   BUN <5* 02/14/2015 1439   CREATININE 0.8 06/11/2015 1005   CREATININE 0.5* 03/15/2015   CREATININE 0.65 02/22/2015 1731   CALCIUM 10.0 06/11/2015 1005   CALCIUM 8.5* 02/14/2015 1439   PROT 7.4 06/11/2015 1005   PROT 7.7 02/14/2015 1439   ALBUMIN 3.6 06/11/2015 1005   ALBUMIN 3.3* 02/14/2015 1439   AST 17 06/11/2015 1005   AST 56* 02/14/2015 1439   ALT <9 06/11/2015 1005   ALT 27 02/14/2015 1439   ALKPHOS 117 06/11/2015 1005   ALKPHOS 113 02/14/2015 1439   BILITOT 0.36 06/11/2015 1005   BILITOT 0.4 02/14/2015 1439   GFRNONAA >60 02/22/2015 1731   GFRAA >60 02/22/2015 1731    No results found for: SPEP, UPEP  Lab Results  Component Value Date   WBC 6.1 06/11/2015   NEUTROABS 4.3 06/11/2015   HGB 11.0* 06/11/2015   HCT 32.3* 06/11/2015   MCV 92.2 06/11/2015   PLT 274 06/11/2015      Chemistry      Component Value Date/Time   NA 138 06/11/2015 1005   NA 137 03/15/2015   NA 135 02/14/2015 1439   K 4.2 06/11/2015 1005   K 4.4 03/15/2015   CL 100* 02/14/2015 1439   CO2 27 06/11/2015 1005   CO2 24 02/14/2015 1439   BUN 12.9 06/11/2015 1005   BUN 9  03/15/2015   BUN <5* 02/14/2015 1439   CREATININE 0.8 06/11/2015 1005   CREATININE  0.5* 03/15/2015   CREATININE 0.65 02/22/2015 1731   GLU 86 03/15/2015      Component Value Date/Time   CALCIUM 10.0 06/11/2015 1005   CALCIUM 8.5* 02/14/2015 1439   ALKPHOS 117 06/11/2015 1005   ALKPHOS 113 02/14/2015 1439   AST 17 06/11/2015 1005   AST 56* 02/14/2015 1439   ALT <9 06/11/2015 1005   ALT 27 02/14/2015 1439   BILITOT 0.36 06/11/2015 1005   BILITOT 0.4 02/14/2015 1439      ASSESSMENT & PLAN:  Carcinoma of supraglottis He tolerated treatment very well with objective response to treatment, with resolution of the lymphadenopathy. I will continue aggressive IV fluid support for him For the next 3 weeks. Recently, I stopped giving him IV fluids and he became very hypotensive. He has appointment to go back to ENT in the near future.  He has the tracehostomy removed recently and started to gain weight.     Protein-calorie malnutrition, severe He is dependent on tube feeds for now. We will get him swallow/speech assessment in the near future after his ENT evaluation.  S/P gastrostomy  His feeding tube site looks okay without signs of infection.    Dehydration Clinically, he is symptomatic with signs of dehydration, hypotension and tachycardia. I will give him 2 L of IV fluids daily for 3 weeks.  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 He felt that the Dilaudid is helpful. I gave him prescription Dilaudid to take before each meals and will reassess his pain visit.     No orders of the defined types were placed in this encounter.   All questions were answered. The patient knows to call the clinic with any problems, questions or concerns. No barriers to learning was detected. I spent 20 minutes counseling the patient face to face. The total time spent in the appointment was 25 minutes and more than 50% was on counseling and review of test results     [facility],  [place], MD 06/29/2015 1:39 PM

## 2015-06-29 NOTE — Assessment & Plan Note (Signed)
He is dependent on tube feeds for now. We will get him swallow/speech assessment in the near future after his ENT evaluation. 

## 2015-06-29 NOTE — Assessment & Plan Note (Signed)
His feeding tube site looks okay without signs of infection.     

## 2015-06-29 NOTE — Telephone Encounter (Signed)
Gave pt appt for 11/21 @ 1.45pm.

## 2015-06-30 ENCOUNTER — Ambulatory Visit (HOSPITAL_BASED_OUTPATIENT_CLINIC_OR_DEPARTMENT_OTHER): Payer: Medicaid Other

## 2015-06-30 VITALS — BP 94/64 | HR 135 | Temp 97.8°F

## 2015-06-30 DIAGNOSIS — E43 Unspecified severe protein-calorie malnutrition: Secondary | ICD-10-CM | POA: Diagnosis present

## 2015-06-30 DIAGNOSIS — C321 Malignant neoplasm of supraglottis: Secondary | ICD-10-CM

## 2015-06-30 MED ORDER — SODIUM CHLORIDE 0.9 % IV SOLN
Freq: Once | INTRAVENOUS | Status: AC
  Administered 2015-06-30: 08:00:00 via INTRAVENOUS

## 2015-06-30 MED ORDER — SODIUM CHLORIDE 0.9 % IJ SOLN
10.0000 mL | INTRAMUSCULAR | Status: DC | PRN
  Administered 2015-06-30: 10 mL
  Filled 2015-06-30: qty 10

## 2015-06-30 MED ORDER — HYDROMORPHONE HCL 4 MG/ML IJ SOLN
INTRAMUSCULAR | Status: AC
  Filled 2015-06-30: qty 1

## 2015-06-30 MED ORDER — HYDROMORPHONE HCL 1 MG/ML IJ SOLN
2.0000 mg | INTRAMUSCULAR | Status: DC | PRN
  Administered 2015-06-30: 2 mg via INTRAVENOUS

## 2015-06-30 MED ORDER — HEPARIN SOD (PORK) LOCK FLUSH 100 UNIT/ML IV SOLN
500.0000 [IU] | Freq: Once | INTRAVENOUS | Status: AC | PRN
  Administered 2015-06-30: 500 [IU]
  Filled 2015-06-30: qty 5

## 2015-06-30 NOTE — Patient Instructions (Signed)

## 2015-07-02 ENCOUNTER — Ambulatory Visit (HOSPITAL_BASED_OUTPATIENT_CLINIC_OR_DEPARTMENT_OTHER): Payer: Medicaid Other

## 2015-07-02 VITALS — BP 112/77 | HR 76 | Temp 97.1°F

## 2015-07-02 DIAGNOSIS — E43 Unspecified severe protein-calorie malnutrition: Secondary | ICD-10-CM | POA: Diagnosis not present

## 2015-07-02 DIAGNOSIS — C321 Malignant neoplasm of supraglottis: Secondary | ICD-10-CM

## 2015-07-02 MED ORDER — ALTEPLASE 2 MG IJ SOLR
2.0000 mg | Freq: Once | INTRAMUSCULAR | Status: DC | PRN
  Filled 2015-07-02: qty 2

## 2015-07-02 MED ORDER — HYDROMORPHONE HCL 4 MG/ML IJ SOLN
INTRAMUSCULAR | Status: AC
  Filled 2015-07-02: qty 1

## 2015-07-02 MED ORDER — SODIUM CHLORIDE 0.9 % IJ SOLN
10.0000 mL | INTRAMUSCULAR | Status: DC | PRN
  Administered 2015-07-02: 10 mL
  Filled 2015-07-02: qty 10

## 2015-07-02 MED ORDER — HEPARIN SOD (PORK) LOCK FLUSH 100 UNIT/ML IV SOLN
500.0000 [IU] | Freq: Once | INTRAVENOUS | Status: AC | PRN
  Administered 2015-07-02: 500 [IU]
  Filled 2015-07-02: qty 5

## 2015-07-02 MED ORDER — HYDROMORPHONE HCL 1 MG/ML IJ SOLN
2.0000 mg | Freq: Once | INTRAMUSCULAR | Status: AC
  Administered 2015-07-02: 2 mg via INTRAVENOUS
  Filled 2015-07-02: qty 2

## 2015-07-02 MED ORDER — SODIUM CHLORIDE 0.9 % IV SOLN
Freq: Once | INTRAVENOUS | Status: AC
  Administered 2015-07-02: 15:00:00 via INTRAVENOUS
  Filled 2015-07-02: qty 4

## 2015-07-02 MED ORDER — SODIUM CHLORIDE 0.9 % IV SOLN
2000.0000 mL | Freq: Once | INTRAVENOUS | Status: AC
  Administered 2015-07-02: 1000 mL via INTRAVENOUS

## 2015-07-02 NOTE — Patient Instructions (Signed)

## 2015-07-03 ENCOUNTER — Ambulatory Visit (HOSPITAL_BASED_OUTPATIENT_CLINIC_OR_DEPARTMENT_OTHER): Payer: Medicaid Other

## 2015-07-03 VITALS — BP 105/74 | HR 69 | Temp 98.0°F | Resp 17

## 2015-07-03 DIAGNOSIS — C321 Malignant neoplasm of supraglottis: Secondary | ICD-10-CM | POA: Diagnosis not present

## 2015-07-03 DIAGNOSIS — E43 Unspecified severe protein-calorie malnutrition: Secondary | ICD-10-CM

## 2015-07-03 MED ORDER — SODIUM CHLORIDE 0.9 % IV SOLN
Freq: Once | INTRAVENOUS | Status: AC
  Administered 2015-07-03: 16:00:00 via INTRAVENOUS
  Filled 2015-07-03: qty 4

## 2015-07-03 MED ORDER — SODIUM CHLORIDE 0.9 % IJ SOLN
10.0000 mL | INTRAMUSCULAR | Status: DC | PRN
  Administered 2015-07-03: 10 mL
  Filled 2015-07-03: qty 10

## 2015-07-03 MED ORDER — HEPARIN SOD (PORK) LOCK FLUSH 100 UNIT/ML IV SOLN
500.0000 [IU] | Freq: Once | INTRAVENOUS | Status: AC | PRN
  Administered 2015-07-03: 500 [IU]
  Filled 2015-07-03: qty 5

## 2015-07-03 MED ORDER — HYDROMORPHONE HCL 4 MG/ML IJ SOLN
INTRAMUSCULAR | Status: AC
  Filled 2015-07-03: qty 1

## 2015-07-03 MED ORDER — HYDROMORPHONE HCL 4 MG/ML IJ SOLN
2.0000 mg | INTRAMUSCULAR | Status: DC | PRN
  Administered 2015-07-03: 2 mg via INTRAVENOUS

## 2015-07-03 MED ORDER — SODIUM CHLORIDE 0.9 % IV SOLN
Freq: Once | INTRAVENOUS | Status: AC
  Administered 2015-07-03: 15:00:00 via INTRAVENOUS

## 2015-07-03 NOTE — Patient Instructions (Signed)

## 2015-07-04 ENCOUNTER — Ambulatory Visit (HOSPITAL_BASED_OUTPATIENT_CLINIC_OR_DEPARTMENT_OTHER): Payer: Medicaid Other

## 2015-07-04 ENCOUNTER — Other Ambulatory Visit: Payer: Self-pay | Admitting: *Deleted

## 2015-07-04 ENCOUNTER — Ambulatory Visit: Payer: Medicaid Other | Admitting: Cardiovascular Disease

## 2015-07-04 VITALS — BP 122/80 | HR 91 | Temp 99.7°F | Resp 16

## 2015-07-04 DIAGNOSIS — C321 Malignant neoplasm of supraglottis: Secondary | ICD-10-CM

## 2015-07-04 DIAGNOSIS — E43 Unspecified severe protein-calorie malnutrition: Secondary | ICD-10-CM

## 2015-07-04 MED ORDER — ONDANSETRON HCL 40 MG/20ML IJ SOLN
Freq: Once | INTRAMUSCULAR | Status: AC
  Administered 2015-07-04: 15:00:00 via INTRAVENOUS
  Filled 2015-07-04: qty 4

## 2015-07-04 MED ORDER — HYDROMORPHONE HCL 4 MG/ML IJ SOLN
INTRAMUSCULAR | Status: AC
  Filled 2015-07-04: qty 1

## 2015-07-04 MED ORDER — SODIUM CHLORIDE 0.9 % IV SOLN
Freq: Once | INTRAVENOUS | Status: AC
  Administered 2015-07-04 (×2): via INTRAVENOUS

## 2015-07-04 MED ORDER — HEPARIN SOD (PORK) LOCK FLUSH 100 UNIT/ML IV SOLN
500.0000 [IU] | Freq: Once | INTRAVENOUS | Status: AC | PRN
  Administered 2015-07-04: 500 [IU]
  Filled 2015-07-04: qty 5

## 2015-07-04 MED ORDER — SODIUM CHLORIDE 0.9 % IJ SOLN
10.0000 mL | INTRAMUSCULAR | Status: DC | PRN
  Administered 2015-07-04: 10 mL
  Filled 2015-07-04: qty 10

## 2015-07-04 MED ORDER — HYDROMORPHONE HCL 4 MG/ML IJ SOLN
2.0000 mg | INTRAMUSCULAR | Status: DC | PRN
  Administered 2015-07-04: 2 mg via INTRAVENOUS

## 2015-07-04 NOTE — Patient Instructions (Signed)

## 2015-07-04 NOTE — Progress Notes (Signed)
1715 -  Pt stated relief of pain -  2/10.

## 2015-07-05 ENCOUNTER — Inpatient Hospital Stay (HOSPITAL_COMMUNITY): Admission: RE | Admit: 2015-07-05 | Payer: Medicaid Other | Source: Ambulatory Visit

## 2015-07-06 ENCOUNTER — Ambulatory Visit (HOSPITAL_BASED_OUTPATIENT_CLINIC_OR_DEPARTMENT_OTHER): Payer: Medicaid Other

## 2015-07-06 ENCOUNTER — Other Ambulatory Visit: Payer: Self-pay | Admitting: *Deleted

## 2015-07-06 VITALS — BP 104/63 | HR 78 | Temp 98.7°F | Resp 18

## 2015-07-06 DIAGNOSIS — E43 Unspecified severe protein-calorie malnutrition: Secondary | ICD-10-CM | POA: Diagnosis present

## 2015-07-06 DIAGNOSIS — C321 Malignant neoplasm of supraglottis: Secondary | ICD-10-CM | POA: Diagnosis not present

## 2015-07-06 MED ORDER — SODIUM CHLORIDE 0.9 % IV SOLN
500.0000 mL | Freq: Once | INTRAVENOUS | Status: AC
  Administered 2015-07-06: 500 mL via INTRAVENOUS

## 2015-07-06 MED ORDER — SODIUM CHLORIDE 0.9 % IJ SOLN
10.0000 mL | INTRAMUSCULAR | Status: DC | PRN
  Administered 2015-07-06: 10 mL
  Filled 2015-07-06: qty 10

## 2015-07-06 MED ORDER — HYDROMORPHONE HCL 4 MG/ML IJ SOLN
2.0000 mg | INTRAMUSCULAR | Status: DC | PRN
  Administered 2015-07-06: 2 mg via INTRAVENOUS

## 2015-07-06 MED ORDER — SODIUM CHLORIDE 0.9 % IV SOLN
Freq: Once | INTRAVENOUS | Status: AC
  Administered 2015-07-06: 14:00:00 via INTRAVENOUS

## 2015-07-06 MED ORDER — ONDANSETRON HCL 40 MG/20ML IJ SOLN
Freq: Once | INTRAMUSCULAR | Status: AC
  Administered 2015-07-06: 15:00:00 via INTRAVENOUS
  Filled 2015-07-06: qty 4

## 2015-07-06 MED ORDER — HYDROMORPHONE HCL 4 MG/ML IJ SOLN
INTRAMUSCULAR | Status: AC
  Filled 2015-07-06: qty 1

## 2015-07-06 MED ORDER — HEPARIN SOD (PORK) LOCK FLUSH 100 UNIT/ML IV SOLN
500.0000 [IU] | Freq: Once | INTRAVENOUS | Status: AC | PRN
  Administered 2015-07-06: 500 [IU]
  Filled 2015-07-06: qty 5

## 2015-07-06 NOTE — Progress Notes (Signed)
1448 Dilaudid 2mg  IV for throat pain 5/10 1507 Patient states his pain is now a "one , if anything."

## 2015-07-06 NOTE — Patient Instructions (Signed)

## 2015-07-07 ENCOUNTER — Ambulatory Visit (HOSPITAL_BASED_OUTPATIENT_CLINIC_OR_DEPARTMENT_OTHER): Payer: Medicaid Other

## 2015-07-07 VITALS — BP 97/64 | HR 112 | Temp 98.0°F | Resp 18

## 2015-07-07 DIAGNOSIS — C321 Malignant neoplasm of supraglottis: Secondary | ICD-10-CM

## 2015-07-07 DIAGNOSIS — E43 Unspecified severe protein-calorie malnutrition: Secondary | ICD-10-CM

## 2015-07-07 MED ORDER — SODIUM CHLORIDE 0.9 % IV SOLN
Freq: Once | INTRAVENOUS | Status: AC
  Administered 2015-07-07: 09:00:00 via INTRAVENOUS

## 2015-07-07 MED ORDER — HYDROMORPHONE HCL 4 MG/ML IJ SOLN
INTRAMUSCULAR | Status: AC
  Filled 2015-07-07: qty 1

## 2015-07-07 MED ORDER — SODIUM CHLORIDE 0.9 % IJ SOLN
10.0000 mL | INTRAMUSCULAR | Status: DC | PRN
  Administered 2015-07-07: 10 mL
  Filled 2015-07-07: qty 10

## 2015-07-07 MED ORDER — HEPARIN SOD (PORK) LOCK FLUSH 100 UNIT/ML IV SOLN
500.0000 [IU] | Freq: Once | INTRAVENOUS | Status: AC | PRN
  Administered 2015-07-07: 500 [IU]
  Filled 2015-07-07: qty 5

## 2015-07-07 MED ORDER — HYDROMORPHONE HCL 1 MG/ML IJ SOLN
2.0000 mg | INTRAMUSCULAR | Status: DC | PRN
  Administered 2015-07-07: 2 mg via INTRAVENOUS

## 2015-07-07 NOTE — Patient Instructions (Signed)

## 2015-07-09 ENCOUNTER — Ambulatory Visit (HOSPITAL_BASED_OUTPATIENT_CLINIC_OR_DEPARTMENT_OTHER): Payer: Medicaid Other | Admitting: Hematology and Oncology

## 2015-07-09 ENCOUNTER — Ambulatory Visit (HOSPITAL_BASED_OUTPATIENT_CLINIC_OR_DEPARTMENT_OTHER): Payer: Medicaid Other

## 2015-07-09 VITALS — BP 88/66 | HR 117 | Temp 98.4°F | Resp 20

## 2015-07-09 VITALS — BP 88/66 | HR 117 | Temp 98.4°F | Resp 18

## 2015-07-09 DIAGNOSIS — I951 Orthostatic hypotension: Secondary | ICD-10-CM

## 2015-07-09 DIAGNOSIS — E43 Unspecified severe protein-calorie malnutrition: Secondary | ICD-10-CM

## 2015-07-09 DIAGNOSIS — C321 Malignant neoplasm of supraglottis: Secondary | ICD-10-CM | POA: Diagnosis present

## 2015-07-09 MED ORDER — SODIUM CHLORIDE 0.9 % IV SOLN
Freq: Once | INTRAVENOUS | Status: AC
  Administered 2015-07-09: 14:00:00 via INTRAVENOUS

## 2015-07-09 MED ORDER — HEPARIN SOD (PORK) LOCK FLUSH 100 UNIT/ML IV SOLN
500.0000 [IU] | Freq: Once | INTRAVENOUS | Status: DC | PRN
  Filled 2015-07-09: qty 5

## 2015-07-09 MED ORDER — ONDANSETRON HCL 40 MG/20ML IJ SOLN
Freq: Once | INTRAMUSCULAR | Status: AC
  Administered 2015-07-09: 14:00:00 via INTRAVENOUS
  Filled 2015-07-09: qty 4

## 2015-07-09 MED ORDER — HYDROMORPHONE HCL 4 MG/ML IJ SOLN
INTRAMUSCULAR | Status: AC
  Filled 2015-07-09: qty 1

## 2015-07-09 MED ORDER — HYDROMORPHONE HCL 4 MG/ML IJ SOLN
2.0000 mg | INTRAMUSCULAR | Status: DC | PRN
  Administered 2015-07-09: 2 mg via INTRAVENOUS

## 2015-07-09 NOTE — Patient Instructions (Signed)
Dehydration, Adult Dehydration is when you lose more fluids from the body than you take in. Vital organs like the kidneys, brain, and heart cannot function without a proper amount of fluids and salt. Any loss of fluids from the body can cause dehydration.  CAUSES   Vomiting.  Diarrhea.  Excessive sweating.  Excessive urine output.  Fever. SYMPTOMS  Mild dehydration  Thirst.  Dry lips.  Slightly dry mouth. Moderate dehydration  Very dry mouth.  Sunken eyes.  Skin does not bounce back quickly when lightly pinched and released.  Dark urine and decreased urine production.  Decreased tear production.  Headache. Severe dehydration  Very dry mouth.  Extreme thirst.  Rapid, weak pulse (more than 100 beats per minute at rest).  Cold hands and feet.  Not able to sweat in spite of heat and temperature.  Rapid breathing.  Blue lips.  Confusion and lethargy.  Difficulty being awakened.  Minimal urine production.  No tears. DIAGNOSIS  Your caregiver will diagnose dehydration based on your symptoms and your exam. Blood and urine tests will help confirm the diagnosis. The diagnostic evaluation should also identify the cause of dehydration. TREATMENT  Treatment of mild or moderate dehydration can often be done at home by increasing the amount of fluids that you drink. It is best to drink small amounts of fluid more often. Drinking too much at one time can make vomiting worse. Refer to the home care instructions below. Severe dehydration needs to be treated at the hospital where you will probably be given intravenous (IV) fluids that contain water and electrolytes. HOME CARE INSTRUCTIONS   Ask your caregiver about specific rehydration instructions.  Drink enough fluids to keep your urine clear or pale yellow.  Drink small amounts frequently if you have nausea and vomiting.  Eat as you normally do.  Avoid:  Foods or drinks high in sugar.  Carbonated  drinks.  Juice.  Extremely hot or cold fluids.  Drinks with caffeine.  Fatty, greasy foods.  Alcohol.  Tobacco.  Overeating.  Gelatin desserts.  Wash your hands well to avoid spreading bacteria and viruses.  Only take over-the-counter or prescription medicines for pain, discomfort, or fever as directed by your caregiver.  Ask your caregiver if you should continue all prescribed and over-the-counter medicines.  Keep all follow-up appointments with your caregiver. SEEK MEDICAL CARE IF:  You have abdominal pain and it increases or stays in one area (localizes).  You have a rash, stiff neck, or severe headache.  You are irritable, sleepy, or difficult to awaken.  You are weak, dizzy, or extremely thirsty. SEEK IMMEDIATE MEDICAL CARE IF:   You are unable to keep fluids down or you get worse despite treatment.  You have frequent episodes of vomiting or diarrhea.  You have blood or green matter (bile) in your vomit.  You have blood in your stool or your stool looks black and tarry.  You have not urinated in 6 to 8 hours, or you have only urinated a small amount of very dark urine.  You have a fever.  You faint. MAKE SURE YOU:   Understand these instructions.  Will watch your condition.  Will get help right away if you are not doing well or get worse. Document Released: 08/04/2005 Document Revised: 10/27/2011 Document Reviewed: 03/24/2011 ExitCare Patient Information 2015 [employer]. This information is not intended to replace advice given to you by your health care provider. Make sure you discuss any questions you have with your health care   provider.  

## 2015-07-10 ENCOUNTER — Telehealth: Payer: Self-pay | Admitting: Hematology and Oncology

## 2015-07-10 ENCOUNTER — Encounter: Payer: Self-pay | Admitting: *Deleted

## 2015-07-10 ENCOUNTER — Encounter: Payer: Self-pay | Admitting: Hematology and Oncology

## 2015-07-10 ENCOUNTER — Telehealth: Payer: Self-pay | Admitting: *Deleted

## 2015-07-10 ENCOUNTER — Ambulatory Visit (HOSPITAL_BASED_OUTPATIENT_CLINIC_OR_DEPARTMENT_OTHER): Payer: Medicaid Other

## 2015-07-10 VITALS — BP 104/70 | HR 75 | Temp 98.3°F | Resp 18

## 2015-07-10 DIAGNOSIS — E43 Unspecified severe protein-calorie malnutrition: Secondary | ICD-10-CM | POA: Diagnosis not present

## 2015-07-10 DIAGNOSIS — C321 Malignant neoplasm of supraglottis: Secondary | ICD-10-CM

## 2015-07-10 MED ORDER — SODIUM CHLORIDE 0.9 % IV SOLN
Freq: Once | INTRAVENOUS | Status: AC
  Administered 2015-07-10: 14:00:00 via INTRAVENOUS

## 2015-07-10 MED ORDER — HEPARIN SOD (PORK) LOCK FLUSH 100 UNIT/ML IV SOLN
500.0000 [IU] | Freq: Once | INTRAVENOUS | Status: AC | PRN
  Administered 2015-07-10: 500 [IU]
  Filled 2015-07-10: qty 5

## 2015-07-10 MED ORDER — SODIUM CHLORIDE 0.9 % IV SOLN
Freq: Once | INTRAVENOUS | Status: AC
  Administered 2015-07-10: 14:00:00 via INTRAVENOUS
  Filled 2015-07-10: qty 4

## 2015-07-10 MED ORDER — SODIUM CHLORIDE 0.9 % IJ SOLN
10.0000 mL | INTRAMUSCULAR | Status: DC | PRN
  Administered 2015-07-10: 10 mL
  Filled 2015-07-10: qty 10

## 2015-07-10 MED ORDER — HYDROMORPHONE HCL 1 MG/ML IJ SOLN
2.0000 mg | INTRAMUSCULAR | Status: DC | PRN
  Administered 2015-07-10: 2 mg via INTRAVENOUS
  Filled 2015-07-10: qty 2

## 2015-07-10 MED ORDER — HYDROMORPHONE HCL 4 MG/ML IJ SOLN
INTRAMUSCULAR | Status: AC
  Filled 2015-07-10: qty 1

## 2015-07-10 NOTE — Patient Instructions (Signed)

## 2015-07-10 NOTE — Telephone Encounter (Signed)
Pt notified of appts next week at [facility]. 11/28 1000 11/29 1200 11/30 1200 12/1  1200 12/2 1200

## 2015-07-10 NOTE — Telephone Encounter (Signed)
S.s. Closing at 12 and tammie s.w. Maggie to get appt °

## 2015-07-10 NOTE — Assessment & Plan Note (Signed)
He tolerated treatment very well with objective response to treatment, with resolution of the lymphadenopathy. I will continue aggressive IV fluid support for him For the next 3 weeks. Recently, I stopped giving him IV fluids and he became very hypotensive. He has appointment to go back to ENT in the near future.  He has the tracehostomy removed recently and started to gain weight.

## 2015-07-10 NOTE — Assessment & Plan Note (Signed)
The cause is unknown. He is not on any medication that could cause this. He usually responds well to fluid resuscitation. Clinically, he does not look septic. His blood counts including part of function tests and cortisol level were unremarkable. I will continue IV fluid support and refer him to cardiologist for evaluation of severe hypotension

## 2015-07-10 NOTE — Progress Notes (Signed)
[facility] OFFICE PROGRESS NOTE  Patient Care Team: No Pcp Per Patient as PCP - General (General Practice) [name], MD as Consulting Physician (Otolaryngology) [name], MD as Attending Physician (Radiation Oncology) [name], MD as Consulting Physician (Hematology and Oncology) Leota Sauers, RN as Oncology Nurse Navigator Lenn Cal, DDS as Consulting Physician (Dentistry)  SUMMARY OF ONCOLOGIC HISTORY: Oncology History   Carcinoma of supraglottis   Staging form: Larynx - Supraglottis, AJCC 7th Edition     Clinical stage from 03/13/2015: Stage IVA (T3, N2b, M0) - Signed by [name], MD on 03/13/2015     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[place])   01/22/2015 Imaging CT scan showed enhancing infiltrative mass centered at the right piriform sinus with bilateral neck lymphadenopathy   02/22/2015 - 03/08/2015 Hospital Admission He was admitted to the hospital for management of advanced supraglottic cancer status post tracheostomy, biopsy, dental extraction, placement of feeding tube and subsequent discharge to skilled nursing facility   02/22/2015 Pathology Results Accession: [id number] 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   03/09/2015 PET scan 1)  Right piriform sinus mass with right level 2 nodal metastasis.  2)  Hypermetabolic thoracic nodes which are at least partially felt to be reactive.  3)  No evidence of subdiaphragmatic metastatic disease.    03/16/2015 Procedure He has placement of port   03/21/2015 - 05/04/2015 Chemotherapy He received high dose cisplatin x 3 cycles   03/21/2015 - 05/09/2015 Radiation Therapy Tomotherapy to supraglottic tumor and bilateral neck:  70 Gy in 35 fractions to gross disease, 63 Gy in 35 fractions to high risk nodal echelons, and 56 Gy in 35 fractions to intermediate risk nodal echelons.   05/09/2015 Miscellaneous He went  to the ED with fall    INTERVAL HISTORY: Please see below for problem oriented charting. He is seen at the infusion area. The patient was hypotensive and tachycardic with mild dizziness at the beginning of infusion. After 2 L of IV fluid, he felt much better. He is able to keep reasonable oral intake along with 3 cans of nutritional supplement through the feeding tube daily. He is improving since tracheostomy was removed and his pain has subsided.  REVIEW OF SYSTEMS:   Constitutional: Denies fevers, chills or abnormal weight loss Eyes: Denies blurriness of vision Ears, nose, mouth, throat, and face: Denies mucositis or sore throat Respiratory: Denies cough, dyspnea or wheezes Cardiovascular: Denies palpitation, chest discomfort or lower extremity swelling Gastrointestinal:  Denies nausea, heartburn or change in bowel habits Skin: Denies abnormal skin rashes Lymphatics: Denies new lymphadenopathy or easy bruising Neurological:Denies numbness, tingling or new weaknesses Behavioral/Psych: Mood is stable, no new changes  All other systems were reviewed with the patient and are negative.  I have reviewed the past medical history, past surgical history, social history and family history with the patient and they are unchanged from previous note.  ALLERGIES:  has No Known Allergies.  MEDICATIONS:  Current Outpatient Prescriptions  Medication Sig Dispense Refill  . bisacodyl (DULCOLAX) 10 MG suppository Place 10 mg rectally as needed for moderate constipation.    . citalopram (CELEXA) 20 MG tablet 20 mg by PEG Tube route daily.    . fentaNYL (DURAGESIC - DOSED MCG/HR) 50 MCG/HR Apply one patch topically every 72 hours for pain. Remove old patch. External use only. 10 patch 0  . folic acid (FOLVITE) 1 MG tablet Take  1 tablet (1 mg total) by mouth daily. 30 tablet 0  . HYDROcodone-acetaminophen (HYCET) 7.5-325 mg/15 ml solution Give 15 mL per g/t every 6 hours as needed for pain    .  HYDROmorphone (DILAUDID) 4 MG tablet Take 1 tablets 30 minutes before meals, up to maximum 6 tablets per day if needed 60 tablet 0  . ibuprofen (ADVIL,MOTRIN) 100 MG/5ML suspension Take 200-400 mg by mouth every 4 (four) hours as needed for mild pain.     Marland Kitchen lidocaine (XYLOCAINE) 2 % solution Mix 1 part 2%viscous lidocaine,1part H2O.Swish and/or swallow 49ml of this mixture,50min before meals and at bedtime, up to QID (Patient taking differently: Mix 1 part 2%viscous lidocaine,1part H2O.Swish and/or swallow 40ml of this mixture,66min before meals and at bedtime, up to QID as needed for sore throat) 100 mL 5  . lidocaine-prilocaine (EMLA) cream Apply 1 application topically as needed (FOR PORT ACCESS).    . LORazepam (ATIVAN) 1 MG tablet Take 0.5 mg by mouth 3 (three) times daily.     . magnesium hydroxide (MILK OF MAGNESIA) 400 MG/5ML suspension Take 30 mLs by mouth daily as needed for mild constipation.    Marland Kitchen morphine (ROXANOL) 20 MG/ML concentrated solution Take 1 mL (20 mg total) by mouth every 2 (two) hours as needed for moderate pain or severe pain. 240 mL 0  . Multiple Vitamin (MULTIVITAMIN WITH MINERALS) TABS tablet Take 1 tablet by mouth daily. 30 tablet 0  . omeprazole (PRILOSEC) 20 MG capsule 20 mg by PEG Tube route daily.    . ondansetron (ZOFRAN) 8 MG tablet 8 mg by PEG Tube route every 8 (eight) hours as needed for nausea or vomiting.    . pravastatin (PRAVACHOL) 20 MG tablet 20 mg by PEG Tube route daily.    . prochlorperazine (COMPAZINE) 10 MG tablet 10 mg by PEG Tube route every 6 (six) hours as needed for nausea or vomiting.    . sodium fluoride (PREVIDENT 5000 PLUS) 1.1 % CREA dental cream Apply thin ribbon of cream to tooth brush. Brush teeth for 2 minutes. Spit out excess-DO NOT swallow. DO NOT rinse afterwards. Repeat nightly. 1 Tube prn  . sodium phosphate (FLEET) enema Place 1 enema rectally once as needed (for constipation). follow package directions    . sucralfate (CARAFATE) 1 G  tablet Dissolve 1 tablet in 59ml H2O and swallow up to QID,PRN sore throat. 60 tablet 5  . thiamine 100 MG tablet Take 1 tablet (100 mg total) by mouth daily. 30 tablet 0   No current facility-administered medications for this visit.   Facility-Administered Medications Ordered in Other Visits  Medication Dose Route Frequency Provider Last Rate Last Dose  . 0.9 %  sodium chloride infusion   Intravenous Once [name], MD      . ondansetron (ZOFRAN) 8 mg in sodium chloride 0.9 % 50 mL IVPB   Intravenous Once [name], MD      . ondansetron (ZOFRAN) 8 mg in sodium chloride 0.9 % 50 mL IVPB   Intravenous Once [name], MD      . ondansetron (ZOFRAN) 8 mg in sodium chloride 0.9 % 50 mL IVPB   Intravenous Once [name], MD      . sodium chloride 0.9 % injection 10 mL  10 mL Intracatheter PRN Ricketta Colantonio, MD      . sodium chloride 0.9 % injection 10 mL  10 mL Intracatheter PRN [name], MD        PHYSICAL EXAMINATION:  ECOG PERFORMANCE STATUS: 0 - Asymptomatic  Filed Vitals:   07/09/15 1325  BP: 88/66  Pulse: 117  Temp: 98.4 F (36.9 C)  Resp: 20   There were no vitals filed for this visit.  GENERAL:alert, no distress and comfortable SKIN: skin color, texture, turgor are normal, no rashes or significant lesions EYES: normal, Conjunctiva are pink and non-injected, sclera clear OROPHARYNX:no exudate, no erythema and lips, buccal mucosa, and tongue normal  NECK: supple, thyroid normal size, non-tender, without nodularity LYMPH:  no palpable lymphadenopathy in the cervical, axillary or inguinal LUNGS: clear to auscultation and percussion with normal breathing effort HEART: regular rate & rhythm and no murmurs and no lower extremity edema ABDOMEN:abdomen soft, non-tender and normal bowel sounds Musculoskeletal:no cyanosis of digits and no clubbing  NEURO: alert & oriented x 3 with fluent speech, no focal motor/sensory deficits  LABORATORY DATA:  I have reviewed the data as  listed    Component Value Date/Time   NA 138 06/11/2015 1005   NA 137 03/15/2015   NA 135 02/14/2015 1439   K 4.2 06/11/2015 1005   K 4.4 03/15/2015   CL 100* 02/14/2015 1439   CO2 27 06/11/2015 1005   CO2 24 02/14/2015 1439   GLUCOSE 108 06/11/2015 1005   GLUCOSE 81 02/14/2015 1439   BUN 12.9 06/11/2015 1005   BUN 9 03/15/2015   BUN <5* 02/14/2015 1439   CREATININE 0.8 06/11/2015 1005   CREATININE 0.5* 03/15/2015   CREATININE 0.65 02/22/2015 1731   CALCIUM 10.0 06/11/2015 1005   CALCIUM 8.5* 02/14/2015 1439   PROT 7.4 06/11/2015 1005   PROT 7.7 02/14/2015 1439   ALBUMIN 3.6 06/11/2015 1005   ALBUMIN 3.3* 02/14/2015 1439   AST 17 06/11/2015 1005   AST 56* 02/14/2015 1439   ALT <9 06/11/2015 1005   ALT 27 02/14/2015 1439   ALKPHOS 117 06/11/2015 1005   ALKPHOS 113 02/14/2015 1439   BILITOT 0.36 06/11/2015 1005   BILITOT 0.4 02/14/2015 1439   GFRNONAA >60 02/22/2015 1731   GFRAA >60 02/22/2015 1731    No results found for: SPEP, UPEP  Lab Results  Component Value Date   WBC 6.1 06/11/2015   NEUTROABS 4.3 06/11/2015   HGB 11.0* 06/11/2015   HCT 32.3* 06/11/2015   MCV 92.2 06/11/2015   PLT 274 06/11/2015      Chemistry      Component Value Date/Time   NA 138 06/11/2015 1005   NA 137 03/15/2015   NA 135 02/14/2015 1439   K 4.2 06/11/2015 1005   K 4.4 03/15/2015   CL 100* 02/14/2015 1439   CO2 27 06/11/2015 1005   CO2 24 02/14/2015 1439   BUN 12.9 06/11/2015 1005   BUN 9 03/15/2015   BUN <5* 02/14/2015 1439   CREATININE 0.8 06/11/2015 1005   CREATININE 0.5* 03/15/2015   CREATININE 0.65 02/22/2015 1731   GLU 86 03/15/2015      Component Value Date/Time   CALCIUM 10.0 06/11/2015 1005   CALCIUM 8.5* 02/14/2015 1439   ALKPHOS 117 06/11/2015 1005   ALKPHOS 113 02/14/2015 1439   AST 17 06/11/2015 1005   AST 56* 02/14/2015 1439   ALT <9 06/11/2015 1005   ALT 27 02/14/2015 1439   BILITOT 0.36 06/11/2015 1005   BILITOT 0.4 02/14/2015 1439      ASSESSMENT & PLAN:  Carcinoma of supraglottis He tolerated treatment very well with objective response to treatment, with resolution of the lymphadenopathy. I will continue aggressive IV fluid support  for him For the next 3 weeks. Recently, I stopped giving him IV fluids and he became very hypotensive. He has appointment to go back to ENT in the near future.  He has the tracehostomy removed recently and started to gain weight.   Orthostatic hypotension The cause is unknown. He is not on any medication that could cause this. He usually responds well to fluid resuscitation. Clinically, he does not look septic. His blood counts including part of function tests and cortisol level were unremarkable. I will continue IV fluid support and refer him to cardiologist for evaluation of severe hypotension  Protein-calorie malnutrition, severe He is dependent on tube feeds and is also getting some oral intake  We will get him swallow/speech assessment in the near future after his ENT evaluation.     No orders of the defined types were placed in this encounter.   All questions were answered. The patient knows to call the clinic with any problems, questions or concerns. No barriers to learning was detected. I spent 15 minutes counseling the patient face to face. The total time spent in the appointment was 20 minutes and more than 50% was on counseling and review of test results     [facility], [place], MD 07/10/2015 7:27 AM

## 2015-07-10 NOTE — Assessment & Plan Note (Addendum)
He is dependent on tube feeds and is also getting some oral intake  We will get him swallow/speech assessment in the near future after his ENT evaluation.

## 2015-07-11 ENCOUNTER — Ambulatory Visit (HOSPITAL_BASED_OUTPATIENT_CLINIC_OR_DEPARTMENT_OTHER): Payer: Medicaid Other

## 2015-07-11 VITALS — BP 82/57 | HR 109 | Temp 98.9°F | Resp 16

## 2015-07-11 DIAGNOSIS — C321 Malignant neoplasm of supraglottis: Secondary | ICD-10-CM | POA: Diagnosis not present

## 2015-07-11 DIAGNOSIS — E43 Unspecified severe protein-calorie malnutrition: Secondary | ICD-10-CM

## 2015-07-11 MED ORDER — HYDROMORPHONE HCL 4 MG/ML IJ SOLN
INTRAMUSCULAR | Status: AC
  Filled 2015-07-11: qty 1

## 2015-07-11 MED ORDER — HYDROMORPHONE HCL 4 MG/ML IJ SOLN
2.0000 mg | INTRAMUSCULAR | Status: DC | PRN
  Administered 2015-07-11: 2 mg via INTRAVENOUS

## 2015-07-11 MED ORDER — SODIUM CHLORIDE 0.9 % IJ SOLN
10.0000 mL | INTRAMUSCULAR | Status: DC | PRN
  Administered 2015-07-11: 10 mL
  Filled 2015-07-11: qty 10

## 2015-07-11 MED ORDER — HEPARIN SOD (PORK) LOCK FLUSH 100 UNIT/ML IV SOLN
500.0000 [IU] | Freq: Once | INTRAVENOUS | Status: AC | PRN
  Administered 2015-07-11: 500 [IU]
  Filled 2015-07-11: qty 5

## 2015-07-11 MED ORDER — SODIUM CHLORIDE 0.9 % IV SOLN
2000.0000 mL | Freq: Once | INTRAVENOUS | Status: AC
  Administered 2015-07-11: 1000 mL via INTRAVENOUS

## 2015-07-11 MED ORDER — ONDANSETRON HCL 40 MG/20ML IJ SOLN
Freq: Once | INTRAMUSCULAR | Status: AC
  Administered 2015-07-11: 13:00:00 via INTRAVENOUS
  Filled 2015-07-11: qty 4

## 2015-07-11 NOTE — Patient Instructions (Signed)

## 2015-07-13 ENCOUNTER — Ambulatory Visit (HOSPITAL_BASED_OUTPATIENT_CLINIC_OR_DEPARTMENT_OTHER): Payer: Medicaid Other

## 2015-07-13 ENCOUNTER — Ambulatory Visit: Payer: Self-pay

## 2015-07-13 ENCOUNTER — Other Ambulatory Visit: Payer: Self-pay | Admitting: *Deleted

## 2015-07-13 VITALS — BP 108/78 | HR 77 | Temp 98.3°F | Resp 20

## 2015-07-13 DIAGNOSIS — C321 Malignant neoplasm of supraglottis: Secondary | ICD-10-CM | POA: Diagnosis not present

## 2015-07-13 DIAGNOSIS — E43 Unspecified severe protein-calorie malnutrition: Secondary | ICD-10-CM | POA: Diagnosis not present

## 2015-07-13 MED ORDER — HYDROMORPHONE HCL 4 MG/ML IJ SOLN
INTRAMUSCULAR | Status: AC
  Filled 2015-07-13: qty 1

## 2015-07-13 MED ORDER — HYDROMORPHONE HCL 4 MG/ML IJ SOLN
2.0000 mg | INTRAMUSCULAR | Status: DC | PRN
Start: 2015-07-13 — End: 2015-07-13
  Administered 2015-07-13: 2 mg via INTRAVENOUS

## 2015-07-13 MED ORDER — SODIUM CHLORIDE 0.9 % IV SOLN
Freq: Once | INTRAVENOUS | Status: AC
  Administered 2015-07-13: 13:00:00 via INTRAVENOUS
  Filled 2015-07-13: qty 4

## 2015-07-13 MED ORDER — HEPARIN SOD (PORK) LOCK FLUSH 100 UNIT/ML IV SOLN
500.0000 [IU] | Freq: Once | INTRAVENOUS | Status: AC | PRN
  Administered 2015-07-13: 500 [IU]
  Filled 2015-07-13: qty 5

## 2015-07-13 MED ORDER — SODIUM CHLORIDE 0.9 % IJ SOLN
10.0000 mL | INTRAMUSCULAR | Status: DC | PRN
  Administered 2015-07-13: 10 mL
  Filled 2015-07-13: qty 10

## 2015-07-13 MED ORDER — SODIUM CHLORIDE 0.9 % IV SOLN
Freq: Once | INTRAVENOUS | Status: AC
  Administered 2015-07-13: 12:00:00 via INTRAVENOUS

## 2015-07-13 NOTE — Progress Notes (Signed)
Pt to return for fluids on 07/14/15, Port left accessed per pt request. Pt and VS stable at time of discharge.

## 2015-07-13 NOTE — Patient Instructions (Signed)

## 2015-07-14 ENCOUNTER — Ambulatory Visit (HOSPITAL_BASED_OUTPATIENT_CLINIC_OR_DEPARTMENT_OTHER): Payer: Medicaid Other

## 2015-07-14 VITALS — BP 104/66 | HR 110 | Temp 98.1°F

## 2015-07-14 DIAGNOSIS — E43 Unspecified severe protein-calorie malnutrition: Secondary | ICD-10-CM

## 2015-07-14 DIAGNOSIS — C321 Malignant neoplasm of supraglottis: Secondary | ICD-10-CM

## 2015-07-14 MED ORDER — SODIUM CHLORIDE 0.9 % IV SOLN: Freq: Once | INTRAVENOUS | Status: DC

## 2015-07-14 MED ORDER — HEPARIN SOD (PORK) LOCK FLUSH 100 UNIT/ML IV SOLN
500.0000 [IU] | Freq: Once | INTRAVENOUS | Status: AC
  Administered 2015-07-14: 500 [IU] via INTRAVENOUS
  Filled 2015-07-14: qty 5

## 2015-07-14 MED ORDER — HYDROMORPHONE HCL 4 MG/ML IJ SOLN
INTRAMUSCULAR | Status: AC
  Filled 2015-07-14: qty 1

## 2015-07-14 MED ORDER — SODIUM CHLORIDE 0.9 % IV SOLN
8.0000 mg | Freq: Once | INTRAVENOUS | Status: AC
  Administered 2015-07-14: 8 mg via INTRAVENOUS

## 2015-07-14 MED ORDER — HEPARIN SOD (PORK) LOCK FLUSH 100 UNIT/ML IV SOLN
500.0000 [IU] | Freq: Once | INTRAVENOUS | Status: DC
  Filled 2015-07-14: qty 5

## 2015-07-14 MED ORDER — SODIUM CHLORIDE 0.9 % IJ SOLN
10.0000 mL | INTRAMUSCULAR | Status: DC | PRN
  Filled 2015-07-14: qty 10

## 2015-07-14 MED ORDER — SODIUM CHLORIDE 0.9 % IJ SOLN
10.0000 mL | Freq: Once | INTRAMUSCULAR | Status: AC
  Administered 2015-07-14: 10 mL via INTRAVENOUS
  Filled 2015-07-14: qty 10

## 2015-07-14 MED ORDER — SODIUM CHLORIDE 0.9 % IV SOLN
Freq: Once | INTRAVENOUS | Status: AC
Start: 2015-07-20 — End: 2015-07-14
  Administered 2015-07-14: 08:00:00 via INTRAVENOUS

## 2015-07-14 MED ORDER — SODIUM CHLORIDE 0.9 % IJ SOLN
10.0000 mL | Freq: Once | INTRAMUSCULAR | Status: DC
  Filled 2015-07-14: qty 10

## 2015-07-14 MED ORDER — HYDROMORPHONE HCL 4 MG/ML IJ SOLN
2.0000 mg | INTRAMUSCULAR | Status: DC | PRN
  Administered 2015-07-14: 2 mg via INTRAVENOUS

## 2015-07-15 NOTE — Progress Notes (Signed)
Cardiology Office Note   Date:  07/16/2015   ID:  JUJUAN GALLIA, DOB July 04, 1976, MRN [id number]  PCP:  No PCP Per Patient  Cardiologist:   Sharol Harness, MD   Chief Complaint  Patient presents with  . New Evaluation    pt states no chest pain, has some dizziness, no edema  . Shortness of Breath    pt states he has some SOB      History of Present Illness: Marvin [name] is a 39 y.o. 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  [name] received 3 cycles of high-dose Cisplatin from 03/21/15 through 05/04/15.  Following this therapy he has experienced persistent orthostatic hypotension.  He was noted to be hypotensive and tachycardic at his infusion center appointment on 11/22.  His blood pressure was 88/66 and his heart rate was 117 bpm.  His vitals improved with IV fluids, as they typically do.  Mr. Chugh was referred to cardiology for management of orthostatic hypotension.  He has some oral intake as well as three cans of supplemental nutrition via g-tube daily.  He reports eating 3 meals of soft food daily.  He also reports drinking 4 large bottles of water daily.  His cortisol level has been checked and was within normal limits.  He also had his CBC checked, which did reveal mild anemia (Hct 32.3) but no leukocytosis or leukopenia.  Mr. Fugua has suffered from stomatitis and his pain has been poorly-controlled.  He has been receiving both IV and PO dilaudid.  He has also been receiving 2L of IV fluids daily for 3 weeks.  His trach was decannulated on 11/2.  This am he was dizzy prior to receiving his infusion of fluid.  He only received 1L of saline today. His dizziness has improved but he does not feel that his baseline.  Symptoms are typically worse in the morning and improve after receiving IV fluids. He denies chest pain, shortness of  breath, lower extremity edema, orthopnea or PND.  Past Medical History  Diagnosis Date  . ETOH abuse   . Alcohol dependence ([place]) 04/17/2012  . COPD (chronic obstructive pulmonary disease) ([place])   . Shortness of breath dyspnea     occ  . Anxiety   . GERD (gastroesophageal reflux disease)   . Seizures ([place])     one episode 09/2012, suspected related to ETOH withdrawal  . Anxiety disorder 03/12/2015  . Headache 05/10/2015  . Hypokalemia 05/10/2015  . Anemia due to chemotherapy 05/11/2015    Past Surgical History  Procedure Laterality Date  . Knee surgery Left     kneecap -screws  . Appendectomy    . Tracheostomy tube placement N/A 02/22/2015    Procedure: TRACHEOSTOMY;  Surgeon: [name], MD;  Location: [facility];  Service: ENT;  Laterality: N/A;  awake tracheostomy  . Direct laryngoscopy N/A 02/22/2015    Procedure: DIRECT LARYNGOSCOPY;  Surgeon: [name], MD;  Location: [facility];  Service: ENT;  Laterality: N/A;  direct laryngoscopy with biopsy  . Multiple extractions with alveoloplasty N/A 03/05/2015    Procedure: Extraction of tooth #'s 1,2,8,9,15,16,17,18,31,32 with alveoloplasty, mandibular right lateral exostoses, and gross debridement of remaining teeth;  Surgeon: Lenn Cal, DDS;  Location: [facility];  Service: Oral Surgery;  Laterality: N/A;     Current Outpatient Prescriptions  Medication Sig Dispense Refill  .  bisacodyl (DULCOLAX) 10 MG suppository Place 10 mg rectally as needed for moderate constipation.    . citalopram (CELEXA) 20 MG tablet 20 mg by PEG Tube route daily.    . fentaNYL (DURAGESIC - DOSED MCG/HR) 50 MCG/HR Apply one patch topically every 72 hours for pain. Remove old patch. External use only. 10 patch 0  . folic acid (FOLVITE) 1 MG tablet Take 1 tablet (1 mg total) by mouth daily. 30 tablet 0  . HYDROcodone-acetaminophen (HYCET) 7.5-325 mg/15 ml solution Give 15 mL per g/t every 6 hours as needed for pain    . HYDROmorphone (DILAUDID) 4 MG tablet Take 1  tablets 30 minutes before meals, up to maximum 6 tablets per day if needed 60 tablet 0  . ibuprofen (ADVIL,MOTRIN) 100 MG/5ML suspension Take 200-400 mg by mouth every 4 (four) hours as needed for mild pain.     Marland Kitchen lidocaine (XYLOCAINE) 2 % solution Mix 1 part 2%viscous lidocaine,1part H2O.Swish and/or swallow 42ml of this mixture,15min before meals and at bedtime, up to QID (Patient taking differently: Mix 1 part 2%viscous lidocaine,1part H2O.Swish and/or swallow 11ml of this mixture,75min before meals and at bedtime, up to QID as needed for sore throat) 100 mL 5  . lidocaine-prilocaine (EMLA) cream Apply 1 application topically as needed (FOR PORT ACCESS).    . LORazepam (ATIVAN) 1 MG tablet Take 0.5 mg by mouth 3 (three) times daily.     . magnesium hydroxide (MILK OF MAGNESIA) 400 MG/5ML suspension Take 30 mLs by mouth daily as needed for mild constipation.    Marland Kitchen morphine (ROXANOL) 20 MG/ML concentrated solution Take 1 mL (20 mg total) by mouth every 2 (two) hours as needed for moderate pain or severe pain. 240 mL 0  . Multiple Vitamin (MULTIVITAMIN WITH MINERALS) TABS tablet Take 1 tablet by mouth daily. 30 tablet 0  . omeprazole (PRILOSEC) 20 MG capsule 20 mg by PEG Tube route daily.    . ondansetron (ZOFRAN) 8 MG tablet 8 mg by PEG Tube route every 8 (eight) hours as needed for nausea or vomiting.    . pravastatin (PRAVACHOL) 20 MG tablet 20 mg by PEG Tube route daily.    . prochlorperazine (COMPAZINE) 10 MG tablet 10 mg by PEG Tube route every 6 (six) hours as needed for nausea or vomiting.    . sodium fluoride (PREVIDENT 5000 PLUS) 1.1 % CREA dental cream Apply thin ribbon of cream to tooth brush. Brush teeth for 2 minutes. Spit out excess-DO NOT swallow. DO NOT rinse afterwards. Repeat nightly. 1 Tube prn  . sodium phosphate (FLEET) enema Place 1 enema rectally once as needed (for constipation). follow package directions    . sucralfate (CARAFATE) 1 G tablet Dissolve 1 tablet in 29ml H2O and  swallow up to QID,PRN sore throat. 60 tablet 5  . thiamine 100 MG tablet Take 1 tablet (100 mg total) by mouth daily. 30 tablet 0  . fludrocortisone (FLORINEF) 0.1 MG tablet Take 1 tablet (0.1 mg total) by mouth daily. 30 tablet 11   No current facility-administered medications for this visit.   Facility-Administered Medications Ordered in Other Visits  Medication Dose Route Frequency Provider Last Rate Last Dose  . 0.9 %  sodium chloride infusion   Intravenous Once [name], MD      . HYDROmorphone (DILAUDID) injection 2 mg  2 mg Intravenous Q2H PRN Ni Gorsuch, MD      . HYDROmorphone (DILAUDID) injection 2 mg  2 mg Intravenous Q2H PRN Ni Gorsuch,  MD   2 mg at 07/16/15 1042  . ondansetron (ZOFRAN) 8 mg in sodium chloride 0.9 % 50 mL IVPB   Intravenous Once [name], MD      . ondansetron (ZOFRAN) 8 mg in sodium chloride 0.9 % 50 mL IVPB   Intravenous Once [name], MD      . ondansetron (ZOFRAN) 8 mg in sodium chloride 0.9 % 50 mL IVPB   Intravenous Once [name], MD      . sodium chloride 0.9 % injection 10 mL  10 mL Intracatheter PRN Ni Gorsuch, MD      . sodium chloride 0.9 % injection 10 mL  10 mL Intracatheter PRN [name], MD        Allergies:   Review of patient's allergies indicates no known allergies.    Social History:  The patient  reports that he quit smoking about 5 months ago. His smoking use included Cigarettes. He has a 28 pack-year smoking history. He has never used smokeless tobacco. He reports that he drinks alcohol. He reports that he does not use illicit drugs.   Family History:  The patient's family history includes Cancer in his mother and paternal grandfather; Heart attack in his maternal grandfather and maternal grandmother.    ROS:  Please see the history of present illness.   Otherwise, review of systems are positive for none.   All other systems are reviewed and negative.    PHYSICAL EXAM: VS:  BP 78/56 mmHg  Pulse 92  Ht 6\' 1"  (1.854 m)  Wt  60.147 kg (132 lb 9.6 oz)  BMI 17.50 kg/m2 , BMI Body mass index is 17.5 kg/(m^2). GENERAL: Chronically ill-appearing.  Thin.  HEENT:  Pupils equal round and reactive, fundi not visualized, oral mucosa unremarkable.  MM moist. NECK:  No jugular venous distention, waveform within normal limits, carotid upstroke brisk and symmetric, no bruits, no thyromegaly. 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 LYMPHATICS:  No cervical adenopathy LUNGS:  Clear to auscultation bilaterally HEART:  RRR.  PMI not displaced or sustained,S1 and S2 within normal limits, no S3, no S4, no clicks, no rubs, no murmurs ABD:  Flat, positive bowel sounds normal in frequency in pitch, no bruits, no rebound, no guarding, no midline pulsatile mass, no hepatomegaly, no splenomegaly EXT:  2 plus pulses throughout, no edema, no cyanosis no clubbing SKIN:  No rashes no nodules NEURO:  Cranial nerves II through XII grossly intact, motor grossly intact throughout PSYCH:  Cognitively intact, oriented to person place and time    EKG:  EKG is ordered today. The ekg ordered today demonstrates sinus rhythm rate 92 bpm.     Recent Labs: 06/04/2015: TSH 0.529 06/11/2015: ALT <9; BUN 12.9; Creatinine 0.8; HGB 11.0*; Magnesium 1.8; Platelets 274; Potassium 4.2; Sodium 138    Lipid Panel No results found for: CHOL, TRIG, HDL, CHOLHDL, VLDL, LDLCALC, LDLDIRECT    Wt Readings from Last 3 Encounters:  07/16/15 60.147 kg (132 lb 9.6 oz)  06/29/15 59.058 kg (130 lb 3.2 oz)  06/08/15 58.242 kg (128 lb 6.4 oz)      ASSESSMENT AND PLAN:  # Hypotension: Mr. Doren Custard has persistent hypotension after chemotherapy. 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  If this is the case, it is unclear how long it will last or if it will improve. He does not particularly seem intravascularly volume depleted on exam. However he does have stomatitis and is only eating soft  foods. It seems that his by mouth intake is  adequate but not robust. I suggested that he increase his by mouth fluid intake and even consider increasing his flushes to the G-tube. He already  had a cortisol stim checked that was normal We will obtain an echo to ensure he has no underlying structural heart disease, though it does not appear that he does on exam.  I also encouraged him to increase his intake of salt and have started Florinef 0.1 mg daily.  We will check a urinalysis, because it seems that intravascular volume depletion.  His urinalysis will help determine his true volume status.  Current medicines are reviewed at length with the patient today.  The patient does not have concerns regarding medicines.  The following changes have been made:  Start florinef 0.1mg  daily.  Labs/ tests ordered today include:   Orders Placed This Encounter  Procedures  . Urinalysis  . EKG 12-Lead  . ECHOCARDIOGRAM COMPLETE     Disposition:   FU with Ravneet Spilker C. Oval Linsey, MD in 1 month.    Signed, Sharol Harness, MD  07/16/2015 3:54 PM    [place]

## 2015-07-16 ENCOUNTER — Encounter (HOSPITAL_COMMUNITY): Payer: Self-pay

## 2015-07-16 ENCOUNTER — Ambulatory Visit (INDEPENDENT_AMBULATORY_CARE_PROVIDER_SITE_OTHER): Payer: Medicaid Other | Admitting: Cardiovascular Disease

## 2015-07-16 ENCOUNTER — Encounter: Payer: Self-pay | Admitting: Cardiovascular Disease

## 2015-07-16 ENCOUNTER — Ambulatory Visit (HOSPITAL_COMMUNITY)
Admission: RE | Admit: 2015-07-16 | Discharge: 2015-07-16 | Disposition: A | Discharge status: Home / Self Care | Payer: Medicaid Other | Source: Ambulatory Visit | Attending: Hematology and Oncology | Admitting: Hematology and Oncology

## 2015-07-16 VITALS — BP 78/56 | HR 92 | Ht 73.0 in | Wt 132.6 lb

## 2015-07-16 DIAGNOSIS — I951 Orthostatic hypotension: Secondary | ICD-10-CM

## 2015-07-16 DIAGNOSIS — C321 Malignant neoplasm of supraglottis: Secondary | ICD-10-CM | POA: Insufficient documentation

## 2015-07-16 MED ORDER — SODIUM CHLORIDE 0.9 % IJ SOLN
10.0000 mL | Freq: Once | INTRAMUSCULAR | Status: AC
  Administered 2015-07-16: 10 mL via INTRAVENOUS

## 2015-07-16 MED ORDER — FLUDROCORTISONE ACETATE 0.1 MG PO TABS: 0.1000 mg | ORAL_TABLET | Freq: Every day | ORAL | Status: DC

## 2015-07-16 MED ORDER — HEPARIN SOD (PORK) LOCK FLUSH 100 UNIT/ML IV SOLN
500.0000 [IU] | Freq: Once | INTRAVENOUS | Status: AC
Start: 2015-07-16 — End: 2015-07-16
  Administered 2015-07-16: 500 [IU] via INTRAVENOUS
  Filled 2015-07-16: qty 5

## 2015-07-16 MED ORDER — HYDROMORPHONE HCL 1 MG/ML IJ SOLN: 2.0000 mg | INTRAMUSCULAR | Status: DC | PRN

## 2015-07-16 MED ORDER — SODIUM CHLORIDE 0.9 % IV SOLN
8.0000 mg | Freq: Once | INTRAVENOUS | Status: AC
  Administered 2015-07-16: 8 mg via INTRAVENOUS
  Filled 2015-07-16: qty 4

## 2015-07-16 MED ORDER — HYDROMORPHONE HCL 4 MG/ML IJ SOLN
2.0000 mg | INTRAMUSCULAR | Status: DC | PRN
  Administered 2015-07-16: 2 mg via INTRAVENOUS
  Filled 2015-07-16: qty 1

## 2015-07-16 MED ORDER — SODIUM CHLORIDE 0.9 % IV SOLN
Freq: Once | INTRAVENOUS | Status: AC
  Administered 2015-07-16: 11:00:00 via INTRAVENOUS

## 2015-07-16 NOTE — Addendum Note (Signed)
Addended by: Margaret Pyle on: 07/16/2015 08:31 AM   Modules accepted: Orders

## 2015-07-16 NOTE — Progress Notes (Signed)
Physician: Dr. Alvy Bimler Procedure: IV infusion of NS and zofran; IV push of dilaudid Pt. Condition: Pt tolerated well; no complications noted Diagnosis: 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[facility]) (161.1)

## 2015-07-16 NOTE — Patient Instructions (Signed)
Medication Instructions:  START Florinef (Fludrocortisone) 0.1 mg - take 1 tablet by mouth daily.  >>A new prescription has been sent to your pharmacy electronically.  Labwork: Your physician recommends that you return for lab work TODAY - urinalysis.  Testing/Procedures: Your physician has requested that you have an echocardiogram. This will be done at our [address] location, [address]. Echocardiography is a painless test that uses sound waves to create images of your heart. It provides your doctor with information about the size and shape of your heart and how well your heart's chambers and valves are working. This procedure takes approximately one hour. There are no restrictions for this procedure.   Follow-Up: Dr Oval Linsey recommends that you schedule a follow-up appointment in 1 month.  If you need a refill on your cardiac medications before your next appointment, please call your pharmacy.

## 2015-07-17 ENCOUNTER — Ambulatory Visit (HOSPITAL_COMMUNITY)
Admission: RE | Admit: 2015-07-17 | Discharge: 2015-07-17 | Disposition: A | Discharge status: Home / Self Care | Payer: Medicaid Other | Source: Ambulatory Visit | Attending: Hematology and Oncology | Admitting: Hematology and Oncology

## 2015-07-17 ENCOUNTER — Telehealth: Payer: Self-pay | Admitting: Cardiovascular Disease

## 2015-07-17 DIAGNOSIS — C321 Malignant neoplasm of supraglottis: Secondary | ICD-10-CM

## 2015-07-17 LAB — URINALYSIS
BILIRUBIN URINE: NEGATIVE
GLUCOSE, UA: NEGATIVE
HGB URINE DIPSTICK: NEGATIVE
KETONES UR: NEGATIVE
Leukocytes, UA: NEGATIVE
Nitrite: NEGATIVE
PH: 6.5 (ref 5.0–8.0)
PROTEIN: NEGATIVE
Specific Gravity, Urine: 1.017 (ref 1.001–1.035)

## 2015-07-17 MED ORDER — SODIUM CHLORIDE 0.9 % IJ SOLN
10.0000 mL | Freq: Once | INTRAMUSCULAR | Status: AC
  Administered 2015-07-17: 10 mL via INTRAVENOUS

## 2015-07-17 MED ORDER — HYDROMORPHONE HCL 4 MG/ML IJ SOLN
2.0000 mg | INTRAMUSCULAR | Status: DC | PRN
  Administered 2015-07-17: 2 mg via INTRAVENOUS
  Filled 2015-07-17: qty 1

## 2015-07-17 MED ORDER — SODIUM CHLORIDE 0.9 % IV SOLN
8.0000 mg | Freq: Once | INTRAVENOUS | Status: AC
  Administered 2015-07-17: 8 mg via INTRAVENOUS
  Filled 2015-07-17: qty 4

## 2015-07-17 MED ORDER — SODIUM CHLORIDE 0.9 % IV SOLN
Freq: Once | INTRAVENOUS | Status: AC
  Administered 2015-07-17: 12:00:00 via INTRAVENOUS

## 2015-07-17 MED ORDER — FLUDROCORTISONE ACETATE 0.1 MG PO TABS: 0.1000 mg | ORAL_TABLET | Freq: Every day | ORAL | Status: DC

## 2015-07-17 MED ORDER — HEPARIN SOD (PORK) LOCK FLUSH 100 UNIT/ML IV SOLN
500.0000 [IU] | Freq: Once | INTRAVENOUS | Status: AC
  Administered 2015-07-17: 500 [IU] via INTRAVENOUS
  Filled 2015-07-17: qty 5

## 2015-07-17 NOTE — Telephone Encounter (Signed)
Marvin [name] is calling in to get an order Florinesf. They do not except prescriptions that have already been filled

## 2015-07-17 NOTE — Telephone Encounter (Signed)
Rx printed & faxed to [facility] at number provided.

## 2015-07-17 NOTE — Procedures (Signed)
[facility]  Procedure Note  KEIGEN PLAUT [id number] DOB: December 02, 1975 DOA: 07/17/2015   Dr. Alvy Bimler  Associated Diagnosis: C32.1  Procedure Note: port accessed, blood return noted, IV fluids infused per order, IV push of dilaudid, and IV zofran given per order, port flushed and de-accessed per protocol   Condition During Procedure:  Patient stable, denies any discomforts   Condition at Discharge:  Patient ambulatory at discharge   Roberto Scales, [facility]

## 2015-07-17 NOTE — Telephone Encounter (Signed)
Care facility for this patient needs an order submitted by fax for medication.  Can be faxed to [phone]

## 2015-07-18 ENCOUNTER — Non-Acute Institutional Stay (SKILLED_NURSING_FACILITY): Payer: Medicaid Other | Admitting: Nurse Practitioner

## 2015-07-18 ENCOUNTER — Encounter: Payer: Self-pay | Admitting: Nurse Practitioner

## 2015-07-18 ENCOUNTER — Telehealth: Payer: Self-pay | Admitting: Cardiovascular Disease

## 2015-07-18 ENCOUNTER — Ambulatory Visit (HOSPITAL_COMMUNITY)
Admission: RE | Admit: 2015-07-18 | Discharge: 2015-07-18 | Disposition: A | Discharge status: Home / Self Care | Payer: Medicaid Other | Source: Ambulatory Visit | Attending: Hematology and Oncology | Admitting: Hematology and Oncology

## 2015-07-18 DIAGNOSIS — E43 Unspecified severe protein-calorie malnutrition: Secondary | ICD-10-CM

## 2015-07-18 DIAGNOSIS — C321 Malignant neoplasm of supraglottis: Secondary | ICD-10-CM | POA: Diagnosis not present

## 2015-07-18 DIAGNOSIS — E785 Hyperlipidemia, unspecified: Secondary | ICD-10-CM | POA: Diagnosis not present

## 2015-07-18 DIAGNOSIS — I951 Orthostatic hypotension: Secondary | ICD-10-CM | POA: Diagnosis not present

## 2015-07-18 DIAGNOSIS — F419 Anxiety disorder, unspecified: Secondary | ICD-10-CM

## 2015-07-18 DIAGNOSIS — R1310 Dysphagia, unspecified: Secondary | ICD-10-CM | POA: Diagnosis not present

## 2015-07-18 MED ORDER — HEPARIN SOD (PORK) LOCK FLUSH 100 UNIT/ML IV SOLN
500.0000 [IU] | Freq: Once | INTRAVENOUS | Status: AC
  Administered 2015-07-18: 500 [IU] via INTRAVENOUS
  Filled 2015-07-18: qty 5

## 2015-07-18 MED ORDER — SODIUM CHLORIDE 0.9 % IJ SOLN: 10.0000 mL | Freq: Once | INTRAMUSCULAR | Status: DC

## 2015-07-18 MED ORDER — SODIUM CHLORIDE 0.9 % IV SOLN
Freq: Once | INTRAVENOUS | Status: AC
  Administered 2015-07-18: 12:00:00 via INTRAVENOUS

## 2015-07-18 MED ORDER — HYDROMORPHONE HCL 4 MG/ML IJ SOLN
2.0000 mg | INTRAMUSCULAR | Status: DC | PRN
  Administered 2015-07-18: 2 mg via INTRAVENOUS
  Filled 2015-07-18: qty 1

## 2015-07-18 MED ORDER — SODIUM CHLORIDE 0.9 % IV SOLN
8.0000 mg | Freq: Once | INTRAVENOUS | Status: AC
  Administered 2015-07-18: 8 mg via INTRAVENOUS
  Filled 2015-07-18: qty 4

## 2015-07-18 NOTE — Telephone Encounter (Signed)
Marvin [name] is calling back stating that she spoke with Ovid Curd on 11/29 about faxing over a prescription for Florinesf but it was not received. The fax number is [phone]

## 2015-07-18 NOTE — Telephone Encounter (Signed)
Spoke to [place], Did a Verbal order for Florinef.

## 2015-07-18 NOTE — Progress Notes (Signed)
Patient ID: Marvin [name], male   DOB: [birth year], 39 y.o.   MRN: [id number]    Nursing Home Location:  [place] of Service: SNF (31)  PCP: No PCP Per Patient  No Known Allergies  Chief Complaint  Patient presents with  . Medical Management of Chronic Issues    Routine Visit     HPI:  Patient is a 39 y.o. male seen today at [facility] for routine follow up on chronic conditions. Pt with a pmh of hx throat pain, muffled voice and weight loss, ETOH and tobacco abuse. Pt has been doing well in the last month. conts to follow up with oncology for hydration and lab work. Lurline Idol has now been removed.  Pt reports strength and appetite improving. Pt denies pain at this time. Pt without complaints.  Review of Systems:  Review of Systems  Constitutional: Negative for activity change, appetite change and fatigue.  HENT: Negative for congestion and hearing loss.   Eyes: Negative.   Respiratory: Negative for cough and shortness of breath.   Cardiovascular: Negative for chest pain, palpitations and leg swelling.  Gastrointestinal: Negative for abdominal pain, diarrhea and constipation.  Genitourinary: Negative for dysuria and difficulty urinating.  Musculoskeletal: Negative for myalgias and arthralgias.  Skin: Negative for color change and wound.  Neurological: Positive for weakness (generalized, improving). Negative for dizziness.  Psychiatric/Behavioral: Negative for behavioral problems, confusion and agitation.    Past Medical History  Diagnosis Date  . ETOH abuse   . Alcohol dependence ([place]) 04/17/2012  . COPD (chronic obstructive pulmonary disease) ([place])   . Shortness of breath dyspnea     occ  . Anxiety   . GERD (gastroesophageal reflux disease)   . Seizures ([place])     one episode 09/2012, suspected related to ETOH withdrawal  . Anxiety disorder 03/12/2015  . Headache 05/10/2015  . Hypokalemia 05/10/2015  . Anemia due to chemotherapy  05/11/2015   Past Surgical History  Procedure Laterality Date  . Knee surgery Left     kneecap -screws  . Appendectomy    . Tracheostomy tube placement N/A 02/22/2015    Procedure: TRACHEOSTOMY;  Surgeon: [name], MD;  Location: [place];  Service: ENT;  Laterality: N/A;  awake tracheostomy  . Direct laryngoscopy N/A 02/22/2015    Procedure: DIRECT LARYNGOSCOPY;  Surgeon: [name], MD;  Location: [place];  Service: ENT;  Laterality: N/A;  direct laryngoscopy with biopsy  . Multiple extractions with alveoloplasty N/A 03/05/2015    Procedure: Extraction of tooth #'s 1,2,8,9,15,16,17,18,31,32 with alveoloplasty, mandibular right lateral exostoses, and gross debridement of remaining teeth;  Surgeon: Lenn Cal, DDS;  Location: [place];  Service: Oral Surgery;  Laterality: N/A;   Social History:   reports that he quit smoking about 5 months ago. His smoking use included Cigarettes. He has a 28 pack-year smoking history. He has never used smokeless tobacco. He reports that he drinks alcohol. He reports that he does not use illicit drugs.  Family History  Problem Relation Age of Onset  . Cancer Mother   . Heart attack Maternal Grandmother     age of death 5  . Heart attack Maternal Grandfather   . Cancer Paternal Grandfather     Medications: Patient's Medications  New Prescriptions   No medications on file  Previous Medications   BISACODYL (DULCOLAX) 10 MG SUPPOSITORY    Place 10 mg rectally as needed for moderate constipation.   CITALOPRAM (CELEXA)  20 MG TABLET    20 mg by PEG Tube route daily.   FENTANYL (DURAGESIC - DOSED MCG/HR) 50 MCG/HR    Apply one patch topically every 72 hours for pain. Remove old patch. External use only.   FLUDROCORTISONE (FLORINEF) 0.1 MG TABLET    Take 1 tablet (0.1 mg total) by mouth daily.   FOLIC ACID (FOLVITE) 1 MG TABLET    Take 1 tablet (1 mg total) by mouth daily.   HYDROMORPHONE (DILAUDID) 4 MG TABLET    Take 1 tablets 30 minutes before meals, up  to maximum 6 tablets per day if needed   IBUPROFEN (ADVIL,MOTRIN) 100 MG/5ML SUSPENSION    Take 200-400 mg by mouth every 4 (four) hours as needed for mild pain.    LIDOCAINE (XYLOCAINE) 2 % SOLUTION    Mix 1 part 2%viscous lidocaine,1part H2O.Swish and/or swallow 73ml of this mixture,64min before meals and at bedtime, up to QID   LIDOCAINE-PRILOCAINE (EMLA) CREAM    Apply 1 application topically as needed (FOR PORT ACCESS).   LORAZEPAM (ATIVAN) 1 MG TABLET    Take 0.5 mg by mouth 3 (three) times daily.    MAGNESIUM HYDROXIDE (MILK OF MAGNESIA) 400 MG/5ML SUSPENSION    Take 30 mLs by mouth daily as needed for mild constipation.   MORPHINE (ROXANOL) 20 MG/ML CONCENTRATED SOLUTION    Take 1 mL (20 mg total) by mouth every 2 (two) hours as needed for moderate pain or severe pain.   MULTIPLE VITAMIN (MULTIVITAMIN WITH MINERALS) TABS TABLET    Take 1 tablet by mouth daily.   OMEPRAZOLE (PRILOSEC) 20 MG CAPSULE    20 mg by PEG Tube route daily.   ONDANSETRON (ZOFRAN) 8 MG TABLET    8 mg by PEG Tube route every 8 (eight) hours as needed for nausea or vomiting.   PRAVASTATIN (PRAVACHOL) 20 MG TABLET    20 mg by PEG Tube route daily.   PROCHLORPERAZINE (COMPAZINE) 10 MG TABLET    10 mg by PEG Tube route every 6 (six) hours as needed for nausea or vomiting.   SODIUM FLUORIDE (PREVIDENT 5000 PLUS) 1.1 % CREA DENTAL CREAM    Apply thin ribbon of cream to tooth brush. Brush teeth for 2 minutes. Spit out excess-DO NOT swallow. DO NOT rinse afterwards. Repeat nightly.   SODIUM PHOSPHATE (FLEET) ENEMA    Place 1 enema rectally once as needed (for constipation). follow package directions   SUCRALFATE (CARAFATE) 1 G TABLET    Dissolve 1 tablet in 75ml H2O and swallow up to QID,PRN sore throat.   THIAMINE 100 MG TABLET    Take 1 tablet (100 mg total) by mouth daily.  Modified Medications   No medications on file  Discontinued Medications   HYDROCODONE-ACETAMINOPHEN (HYCET) 7.5-325 MG/15 ML SOLUTION    Give 15 mL per  g/t every 6 hours as needed for pain     Physical Exam: Filed Vitals:   07/18/15 1427  BP: 132/79  Pulse: 82  Temp: 97.5 F (36.4 C)  TempSrc: Oral  Resp: 20  Height: 6\' 1"  (1.854 m)  Weight: 138 lb 3.2 oz (62.687 kg)    Physical Exam  Constitutional: He is oriented to person, place, and time. No distress.  Thin male, NAD  HENT:  Head: Normocephalic and atraumatic.  Mouth/Throat: Oropharynx is clear and moist. No oropharyngeal exudate.  Eyes: Conjunctivae and EOM are normal. Pupils are equal, round, and reactive to light.  Neck: Normal range of motion. Neck supple.  Cardiovascular: Normal rate, regular rhythm and normal heart sounds.   Pulmonary/Chest: Effort normal and breath sounds normal.  Abdominal: Soft. Bowel sounds are normal.  PEG  Musculoskeletal: He exhibits no edema or tenderness.  Neurological: He is alert and oriented to person, place, and time.  Skin: Skin is warm and dry. He is not diaphoretic.  Psychiatric: He has a normal mood and affect.    Labs reviewed: Basic Metabolic Panel:  Recent Labs  01/21/15 2033 02/14/15 1439  05/30/15 0804 06/04/15 1350 06/08/15 1313 06/11/15 1005  NA 133* 135  < > 136 137  --  138  K 3.6 4.2  < > 4.5 4.2  --  4.2  CL 94* 100*  --   --   --   --   --   CO2  --  24  < > 24 27  --  27  GLUCOSE 90 81  < > 104 109  --  108  BUN <3* <5*  < > 14.8 15.5  --  12.9  CREATININE 1.20 0.46*  < > 0.8 0.8  --  0.8  CALCIUM  --  8.5*  < > 9.8 9.6  --  10.0  MG  --   --   < > 1.8 1.7 1.6 1.8  < > = values in this interval not displayed. Liver Function Tests:  Recent Labs  05/30/15 0804 06/04/15 1350 06/11/15 1005  AST 19 16 17   ALT <9 9 <9  ALKPHOS 145 128 117  BILITOT 0.46 0.33 0.36  PROT 7.4 7.3 7.4  ALBUMIN 3.4* 3.4* 3.6   No results for input(s): LIPASE, AMYLASE in the last 8760 hours. No results for input(s): AMMONIA in the last 8760 hours. CBC:  Recent Labs  05/30/15 0805 06/04/15 1350 06/11/15 1005    WBC 4.2 3.7* 6.1  NEUTROABS 2.9 2.1 4.3  HGB 10.9* 10.4* 11.0*  HCT 32.7* 30.2* 32.3*  MCV 91.6 90.4 92.2  PLT 306 276 274   TSH:  Recent Labs  03/01/15 0341 05/30/15 0805 06/04/15 1350  TSH 0.617 1.319 0.529   A1C: No results found for: HGBA1C Lipid Panel: No results for input(s): CHOL, HDL, LDLCALC, TRIG, CHOLHDL, LDLDIRECT in the last 8760 hours.   Assessment/Plan 1. Hypotension, postural Started on florinef per cardiology 2 days ago due to symptomatic hypotension, to cont and will monitor at this time  2. 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  3. Dysphagia Stable, improved appetite and eating, conts on bolus feeding as well. Pt taking medication by mouth without difficulty.   4. Protein-calorie malnutrition, severe ([place]) Weight has improved, appetite better. conts on bolus feeding for supplements  5. Anxiety disorder, unspecified anxiety disorder type -remains stable, conts on ativan TID scheduled and celexa 20 mg daily  6. Hyperlipemia -no recent lipids, will follow up fasting lipids in AM   Shemuel Harkleroad K. Harle Battiest  [facility] [phone] 8 am - 5 pm) [phone] (after hours)

## 2015-07-18 NOTE — Telephone Encounter (Signed)
Marvin [name] is calling in stating that she spoke with Ovid Curd yesterday(11/29) about faxing over a prescription for Florinesf but they never received it. The fax is [phone]  Thanks

## 2015-07-18 NOTE — Procedures (Signed)
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

## 2015-07-19 ENCOUNTER — Other Ambulatory Visit: Payer: Self-pay | Admitting: *Deleted

## 2015-07-19 ENCOUNTER — Ambulatory Visit (HOSPITAL_COMMUNITY)
Admission: RE | Admit: 2015-07-19 | Discharge: 2015-07-19 | Disposition: A | Discharge status: Home / Self Care | Payer: Medicaid Other | Source: Ambulatory Visit | Attending: Hematology and Oncology | Admitting: Hematology and Oncology

## 2015-07-19 DIAGNOSIS — C321 Malignant neoplasm of supraglottis: Secondary | ICD-10-CM | POA: Insufficient documentation

## 2015-07-19 LAB — BASIC METABOLIC PANEL
BUN: 10 mg/dL (ref 4–21)
Creatinine: 0.6 mg/dL (ref 0.6–1.3)
Glucose: 82 mg/dL
Potassium: 4.2 mmol/L (ref 3.4–5.3)
Sodium: 140 mmol/L (ref 137–147)

## 2015-07-19 LAB — LIPID PANEL
Cholesterol: 105 mg/dL (ref 0–200)
HDL: 34 mg/dL — AB (ref 35–70)
LDL CALC: 56 mg/dL
Triglycerides: 75 mg/dL (ref 40–160)

## 2015-07-19 MED ORDER — HYDROMORPHONE HCL 4 MG/ML IJ SOLN
2.0000 mg | INTRAMUSCULAR | Status: DC | PRN
  Administered 2015-07-19: 2 mg via INTRAVENOUS
  Filled 2015-07-19: qty 1

## 2015-07-19 MED ORDER — SODIUM CHLORIDE 0.9 % IV SOLN
Freq: Once | INTRAVENOUS | Status: AC
  Administered 2015-07-19: 12:00:00 via INTRAVENOUS

## 2015-07-19 MED ORDER — SODIUM CHLORIDE 0.9 % IJ SOLN
10.0000 mL | Freq: Once | INTRAMUSCULAR | Status: AC
  Administered 2015-07-19: 10 mL via INTRAVENOUS

## 2015-07-19 MED ORDER — HEPARIN SOD (PORK) LOCK FLUSH 100 UNIT/ML IV SOLN
500.0000 [IU] | Freq: Once | INTRAVENOUS | Status: AC
  Administered 2015-07-19: 500 [IU] via INTRAVENOUS
  Filled 2015-07-19: qty 5

## 2015-07-19 MED ORDER — SODIUM CHLORIDE 0.9 % IV SOLN
8.0000 mg | Freq: Once | INTRAVENOUS | Status: AC
  Administered 2015-07-19: 8 mg via INTRAVENOUS
  Filled 2015-07-19: qty 4

## 2015-07-19 NOTE — Progress Notes (Signed)
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

## 2015-07-20 ENCOUNTER — Ambulatory Visit (HOSPITAL_COMMUNITY)
Admission: RE | Admit: 2015-07-20 | Discharge: 2015-07-20 | Disposition: A | Discharge status: Home / Self Care | Payer: Medicaid Other | Source: Ambulatory Visit | Attending: Hematology and Oncology | Admitting: Hematology and Oncology

## 2015-07-20 DIAGNOSIS — C321 Malignant neoplasm of supraglottis: Secondary | ICD-10-CM | POA: Diagnosis not present

## 2015-07-20 MED ORDER — SODIUM CHLORIDE 0.9 % IV SOLN
Freq: Once | INTRAVENOUS | Status: AC
  Administered 2015-07-20: 12:00:00 via INTRAVENOUS

## 2015-07-20 MED ORDER — SODIUM CHLORIDE 0.9 % IJ SOLN
10.0000 mL | Freq: Once | INTRAMUSCULAR | Status: AC
  Administered 2015-07-20: 10 mL via INTRAVENOUS

## 2015-07-20 MED ORDER — HYDROMORPHONE HCL 4 MG/ML IJ SOLN
2.0000 mg | INTRAMUSCULAR | Status: DC | PRN
  Administered 2015-07-20: 2 mg via INTRAVENOUS
  Filled 2015-07-20: qty 1

## 2015-07-20 MED ORDER — HEPARIN SOD (PORK) LOCK FLUSH 100 UNIT/ML IV SOLN
500.0000 [IU] | Freq: Once | INTRAVENOUS | Status: AC
  Administered 2015-07-20: 500 [IU] via INTRAVENOUS
  Filled 2015-07-20: qty 5

## 2015-07-20 MED ORDER — SODIUM CHLORIDE 0.9 % IV SOLN
8.0000 mg | Freq: Once | INTRAVENOUS | Status: AC
  Administered 2015-07-20: 8 mg via INTRAVENOUS
  Filled 2015-07-20: qty 4

## 2015-07-20 NOTE — Progress Notes (Signed)
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

## 2015-07-23 ENCOUNTER — Encounter: Payer: Self-pay | Admitting: *Deleted

## 2015-07-23 ENCOUNTER — Encounter: Payer: Self-pay | Admitting: Hematology and Oncology

## 2015-07-23 ENCOUNTER — Telehealth: Payer: Self-pay | Admitting: Hematology and Oncology

## 2015-07-23 ENCOUNTER — Ambulatory Visit (HOSPITAL_BASED_OUTPATIENT_CLINIC_OR_DEPARTMENT_OTHER): Payer: Medicaid Other

## 2015-07-23 ENCOUNTER — Ambulatory Visit (HOSPITAL_BASED_OUTPATIENT_CLINIC_OR_DEPARTMENT_OTHER): Payer: Medicaid Other | Admitting: Hematology and Oncology

## 2015-07-23 ENCOUNTER — Telehealth: Payer: Self-pay | Admitting: *Deleted

## 2015-07-23 VITALS — BP 102/73 | HR 92 | Temp 98.2°F | Resp 18 | Ht 73.0 in | Wt 135.4 lb

## 2015-07-23 DIAGNOSIS — R070 Pain in throat: Secondary | ICD-10-CM

## 2015-07-23 DIAGNOSIS — E43 Unspecified severe protein-calorie malnutrition: Secondary | ICD-10-CM | POA: Diagnosis not present

## 2015-07-23 DIAGNOSIS — I951 Orthostatic hypotension: Secondary | ICD-10-CM

## 2015-07-23 DIAGNOSIS — C321 Malignant neoplasm of supraglottis: Secondary | ICD-10-CM

## 2015-07-23 MED ORDER — HEPARIN SOD (PORK) LOCK FLUSH 100 UNIT/ML IV SOLN
500.0000 [IU] | Freq: Once | INTRAVENOUS | Status: AC | PRN
  Administered 2015-07-23: 500 [IU]
  Filled 2015-07-23: qty 5

## 2015-07-23 MED ORDER — ONDANSETRON HCL 40 MG/20ML IJ SOLN
Freq: Once | INTRAMUSCULAR | Status: AC
  Administered 2015-07-23: 11:00:00 via INTRAVENOUS
  Filled 2015-07-23: qty 4

## 2015-07-23 MED ORDER — SODIUM CHLORIDE 0.9 % IJ SOLN
10.0000 mL | INTRAMUSCULAR | Status: DC | PRN
  Administered 2015-07-23: 10 mL
  Filled 2015-07-23: qty 10

## 2015-07-23 MED ORDER — HYDROMORPHONE HCL 4 MG/ML IJ SOLN
INTRAMUSCULAR | Status: AC
  Filled 2015-07-23: qty 1

## 2015-07-23 MED ORDER — HYDROMORPHONE HCL 1 MG/ML IJ SOLN
2.0000 mg | INTRAMUSCULAR | Status: DC | PRN
  Administered 2015-07-23: 2 mg via INTRAVENOUS
  Filled 2015-07-23: qty 2

## 2015-07-23 MED ORDER — SODIUM CHLORIDE 0.9 % IV SOLN
Freq: Once | INTRAVENOUS | Status: AC
  Administered 2015-07-23: 11:00:00 via INTRAVENOUS

## 2015-07-23 NOTE — Progress Notes (Signed)
  Oncology Nurse Navigator Documentation   Navigator Encounter Type: Clinic/MDC (07/23/15 1045) Patient Visit Type: Medonc (07/23/15 1045)     To provide support and encouragement, care continuity and to assess for needs, met with patient during est pt appt with Dr. Alvy Bimler. He was ambulating with the assist of a walker. He reported:  Eeating soft foods  Using PEG for nutritional supplement.  Will continue to live at [facility] for the foreseeable future. He verbalized understanding of Dr. Calton Dach guidance to take supplement by mouth with a goals:  100% nutrition and hydration by mouth  Weight gain over next month.    PEG removal if goals met. Per Dr. Alvy Bimler, I willl notify Dory [place] to follow-up with patient to address current nutritional status. He understands he can contact me with needs/concerns.  [name], RN, BSN, [facility] [place] [phone]                     Time Spent with Patient: 30 (07/23/15 1045)

## 2015-07-23 NOTE — Telephone Encounter (Signed)
LATE ENTRY 07/23/15  SPOKE TO LINDSAY  EARLIER TODAY IN REGARDS TO URINALYSIS RESULTS FOR PATIENT ROUTED LABS TO  [facility] - FOR ATTENDING TO SEE VERBAL ORDER GIVEN FOR BMP THIS WEEK.

## 2015-07-23 NOTE — Progress Notes (Signed)
[place] OFFICE PROGRESS NOTE  Patient Care Team: No Pcp Per Patient as PCP - General (General Practice) [name], MD as Consulting Physician (Otolaryngology) [name], MD as Attending Physician (Radiation Oncology) [name], MD as Consulting Physician (Hematology and Oncology) Leota Sauers, RN as Oncology Nurse Navigator Lenn Cal, DDS as Consulting Physician (Dentistry)  SUMMARY OF ONCOLOGIC HISTORY: Oncology History   Carcinoma of supraglottis   Staging form: Larynx - Supraglottis, AJCC 7th Edition     Clinical stage from 03/13/2015: Stage IVA (T3, N2b, M0) - Signed by [name], MD on 03/13/2015     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[place])   01/22/2015 Imaging CT scan showed enhancing infiltrative mass centered at the right piriform sinus with bilateral neck lymphadenopathy   02/22/2015 - 03/08/2015 Hospital Admission He was admitted to the hospital for management of advanced supraglottic cancer status post tracheostomy, biopsy, dental extraction, placement of feeding tube and subsequent discharge to skilled nursing facility   02/22/2015 Pathology Results Accession: [id number] 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   03/09/2015 PET scan 1)  Right piriform sinus mass with right level 2 nodal metastasis.  2)  Hypermetabolic thoracic nodes which are at least partially felt to be reactive.  3)  No evidence of subdiaphragmatic metastatic disease.    03/16/2015 Procedure He has placement of port   03/21/2015 - 05/04/2015 Chemotherapy He received high dose cisplatin x 3 cycles   03/21/2015 - 05/09/2015 Radiation Therapy Tomotherapy to supraglottic tumor and bilateral neck:  70 Gy in 35 fractions to gross disease, 63 Gy in 35 fractions to high risk nodal echelons, and 56 Gy in 35 fractions to intermediate risk nodal echelons.   05/09/2015 Miscellaneous He went  to the ED with fall    INTERVAL HISTORY: Please see below for problem oriented charting.  he returns for further follow-up. He is doing better. He has less throat pain. Is able to swallow some soft diet. He is still dependent on tube feeds for nutritional intake. He complained of occasional dizziness  REVIEW OF SYSTEMS:   Constitutional: Denies fevers, chills or abnormal weight loss Eyes: Denies blurriness of vision Ears, nose, mouth, throat, and face: Denies mucositis or sore throat Respiratory: Denies cough, dyspnea or wheezes Cardiovascular: Denies palpitation, chest discomfort or lower extremity swelling Gastrointestinal:  Denies nausea, heartburn or change in bowel habits Skin: Denies abnormal skin rashes Lymphatics: Denies new lymphadenopathy or easy bruising Neurological:Denies numbness, tingling or new weaknesses Behavioral/Psych: Mood is stable, no new changes  All other systems were reviewed with the patient and are negative.  I have reviewed the past medical history, past surgical history, social history and family history with the patient and they are unchanged from previous note.  ALLERGIES:  has No Known Allergies.  MEDICATIONS:  Current Outpatient Prescriptions  Medication Sig Dispense Refill  . bisacodyl (DULCOLAX) 10 MG suppository Place 10 mg rectally as needed for moderate constipation.    . citalopram (CELEXA) 20 MG tablet 20 mg by PEG Tube route daily.    . fentaNYL (DURAGESIC - DOSED MCG/HR) 50 MCG/HR Apply one patch topically every 72 hours for pain. Remove old patch. External use only. 10 patch 0  . fludrocortisone (FLORINEF) 0.1 MG tablet Take 1 tablet (0.1 mg total) by mouth daily. 30 tablet 11  . folic acid (FOLVITE) 1 MG tablet Take 1 tablet (1 mg total) by mouth  daily. 30 tablet 0  . HYDROmorphone (DILAUDID) 4 MG tablet Take 1 tablets 30 minutes before meals, up to maximum 6 tablets per day if needed 60 tablet 0  . ibuprofen (ADVIL,MOTRIN) 100 MG/5ML  suspension Take 200-400 mg by mouth every 4 (four) hours as needed for mild pain.     Marland Kitchen lidocaine (XYLOCAINE) 2 % solution Mix 1 part 2%viscous lidocaine,1part H2O.Swish and/or swallow 66ml of this mixture,48min before meals and at bedtime, up to QID (Patient taking differently: Mix 1 part 2%viscous lidocaine,1part H2O.Swish and/or swallow 52ml of this mixture,38min before meals and at bedtime, up to QID as needed for sore throat) 100 mL 5  . lidocaine-prilocaine (EMLA) cream Apply 1 application topically as needed (FOR PORT ACCESS).    . LORazepam (ATIVAN) 1 MG tablet Take 0.5 mg by mouth 3 (three) times daily.     . magnesium hydroxide (MILK OF MAGNESIA) 400 MG/5ML suspension Take 30 mLs by mouth daily as needed for mild constipation.    Marland Kitchen morphine (ROXANOL) 20 MG/ML concentrated solution Take 1 mL (20 mg total) by mouth every 2 (two) hours as needed for moderate pain or severe pain. 240 mL 0  . Multiple Vitamin (MULTIVITAMIN WITH MINERALS) TABS tablet Take 1 tablet by mouth daily. 30 tablet 0  . omeprazole (PRILOSEC) 20 MG capsule 20 mg by PEG Tube route daily.    . ondansetron (ZOFRAN) 8 MG tablet 8 mg by PEG Tube route every 8 (eight) hours as needed for nausea or vomiting.    . pravastatin (PRAVACHOL) 20 MG tablet 20 mg by PEG Tube route daily.    . prochlorperazine (COMPAZINE) 10 MG tablet 10 mg by PEG Tube route every 6 (six) hours as needed for nausea or vomiting.    . sodium fluoride (PREVIDENT 5000 PLUS) 1.1 % CREA dental cream Apply thin ribbon of cream to tooth brush. Brush teeth for 2 minutes. Spit out excess-DO NOT swallow. DO NOT rinse afterwards. Repeat nightly. 1 Tube prn  . sodium phosphate (FLEET) enema Place 1 enema rectally once as needed (for constipation). follow package directions    . sucralfate (CARAFATE) 1 G tablet Dissolve 1 tablet in 87ml H2O and swallow up to QID,PRN sore throat. 60 tablet 5  . thiamine 100 MG tablet Take 1 tablet (100 mg total) by mouth daily. 30 tablet  0   Current Facility-Administered Medications  Medication Dose Route Frequency Provider Last Rate Last Dose  . HYDROmorphone (DILAUDID) injection 2 mg  2 mg Intravenous Q2H PRN [name], MD   2 mg at 07/23/15 1111  . sodium chloride 0.9 % injection 10 mL  10 mL Intracatheter PRN [name], MD   10 mL at 07/23/15 1233   Facility-Administered Medications Ordered in Other Visits  Medication Dose Route Frequency Provider Last Rate Last Dose  . 0.9 %  sodium chloride infusion   Intravenous Once [name], MD      . HYDROmorphone (DILAUDID) injection 2 mg  2 mg Intravenous Q2H PRN Ashlea Dusing, MD      . ondansetron (ZOFRAN) 8 mg in sodium chloride 0.9 % 50 mL IVPB   Intravenous Once [name], MD      . ondansetron (ZOFRAN) 8 mg in sodium chloride 0.9 % 50 mL IVPB   Intravenous Once [name], MD      . ondansetron (ZOFRAN) 8 mg in sodium chloride 0.9 % 50 mL IVPB   Intravenous Once [name], MD      . sodium  chloride 0.9 % injection 10 mL  10 mL Intracatheter PRN [name], MD      . sodium chloride 0.9 % injection 10 mL  10 mL Intracatheter PRN [name], MD        PHYSICAL EXAMINATION: ECOG PERFORMANCE STATUS: 1 - Symptomatic but completely ambulatory  Filed Vitals:   07/23/15 1045  BP: 102/73  Pulse: 92  Temp: 98.2 F (36.8 C)  Resp: 18   Filed Weights   07/23/15 1045  Weight: 135 lb 6.4 oz (61.417 kg)    GENERAL:alert, no distress and comfortable SKIN: skin color, texture, turgor are normal, no rashes or significant lesions EYES: normal, Conjunctiva are pink and non-injected, sclera clear OROPHARYNX:no exudate, no erythema and lips, buccal mucosa, and tongue normal  NECK: supple, thyroid normal size, non-tender, without nodularity LYMPH:  no palpable lymphadenopathy in the cervical, axillary or inguinal LUNGS: clear to auscultation and percussion with normal breathing effort HEART: regular rate & rhythm and no murmurs and no lower extremity edema ABDOMEN:abdomen  soft, non-tender and normal bowel sounds Musculoskeletal:no cyanosis of digits and no clubbing  NEURO: alert & oriented x 3 with fluent speech, no focal motor/sensory deficits  LABORATORY DATA:  I have reviewed the data as listed    Component Value Date/Time   NA 138 06/11/2015 1005   NA 137 03/15/2015   NA 135 02/14/2015 1439   K 4.2 06/11/2015 1005   K 4.4 03/15/2015   CL 100* 02/14/2015 1439   CO2 27 06/11/2015 1005   CO2 24 02/14/2015 1439   GLUCOSE 108 06/11/2015 1005   GLUCOSE 81 02/14/2015 1439   BUN 12.9 06/11/2015 1005   BUN 9 03/15/2015   BUN <5* 02/14/2015 1439   CREATININE 0.8 06/11/2015 1005   CREATININE 0.5* 03/15/2015   CREATININE 0.65 02/22/2015 1731   CALCIUM 10.0 06/11/2015 1005   CALCIUM 8.5* 02/14/2015 1439   PROT 7.4 06/11/2015 1005   PROT 7.7 02/14/2015 1439   ALBUMIN 3.6 06/11/2015 1005   ALBUMIN 3.3* 02/14/2015 1439   AST 17 06/11/2015 1005   AST 56* 02/14/2015 1439   ALT <9 06/11/2015 1005   ALT 27 02/14/2015 1439   ALKPHOS 117 06/11/2015 1005   ALKPHOS 113 02/14/2015 1439   BILITOT 0.36 06/11/2015 1005   BILITOT 0.4 02/14/2015 1439   GFRNONAA >60 02/22/2015 1731   GFRAA >60 02/22/2015 1731    No results found for: SPEP, UPEP  Lab Results  Component Value Date   WBC 6.1 06/11/2015   NEUTROABS 4.3 06/11/2015   HGB 11.0* 06/11/2015   HCT 32.3* 06/11/2015   MCV 92.2 06/11/2015   PLT 274 06/11/2015      Chemistry      Component Value Date/Time   NA 138 06/11/2015 1005   NA 137 03/15/2015   NA 135 02/14/2015 1439   K 4.2 06/11/2015 1005   K 4.4 03/15/2015   CL 100* 02/14/2015 1439   CO2 27 06/11/2015 1005   CO2 24 02/14/2015 1439   BUN 12.9 06/11/2015 1005   BUN 9 03/15/2015   BUN <5* 02/14/2015 1439   CREATININE 0.8 06/11/2015 1005   CREATININE 0.5* 03/15/2015   CREATININE 0.65 02/22/2015 1731   GLU 86 03/15/2015      Component Value Date/Time   CALCIUM 10.0 06/11/2015 1005   CALCIUM 8.5* 02/14/2015 1439   ALKPHOS 117  06/11/2015 1005   ALKPHOS 113 02/14/2015 1439   AST 17 06/11/2015 1005   AST 56* 02/14/2015 1439  ALT <9 06/11/2015 1005   ALT 27 02/14/2015 1439   BILITOT 0.36 06/11/2015 1005   BILITOT 0.4 02/14/2015 1439     ASSESSMENT & PLAN:  Carcinoma of supraglottis He tolerated treatment very well with objective response to treatment, with resolution of the lymphadenopathy. I will continue aggressive IV fluid support for him For the next 3 weeks. Recently, I stopped giving him IV fluids and he became very hypotensive. He has appointment to go back to ENT in the near future. He has the tracehostomy removed recently and started to gain weight.  he has PET scan scheduled for next month. I will continue to provide supportive care  Protein-calorie malnutrition, severe He is dependent on tube feeds and is also getting some oral intake  We will get him swallow/speech assessment in the near future after his ENT evaluation.  if he can maintain oral intake 100%, we will get the feeding tube removed in the next visit      Orthostatic hypotension The cause is unknown. He is not on any medication that could cause this. He usually responds well to fluid resuscitation. Clinically, he does not look septic. His blood counts including part of function tests and cortisol level were unremarkable.  he was started on Florinef by cardiologist with improvement of his blood pressure. I will start weaning him off IV fluid infusion over the next few weeks    Orders Placed This Encounter  Procedures  . SCHEDULING COMMUNICATION    Please schedule 2 hour infusion visit for fluids and hydration   All questions were answered. The patient knows to call the clinic with any problems, questions or concerns. No barriers to learning was detected. I spent 15 minutes counseling the patient face to face. The total time spent in the appointment was 20 minutes and more than 50% was on counseling and review of test  results     [facility], [place], [place] 07/23/2015 12:36 PM

## 2015-07-23 NOTE — Assessment & Plan Note (Signed)
He tolerated treatment very well with objective response to treatment, with resolution of the lymphadenopathy. I will continue aggressive IV fluid support for him For the next 3 weeks. Recently, I stopped giving him IV fluids and he became very hypotensive. He has appointment to go back to ENT in the near future. He has the tracehostomy removed recently and started to gain weight.  he has PET scan scheduled for next month. I will continue to provide supportive care

## 2015-07-23 NOTE — Assessment & Plan Note (Signed)
The cause is unknown. He is not on any medication that could cause this. He usually responds well to fluid resuscitation. Clinically, he does not look septic. His blood counts including part of function tests and cortisol level were unremarkable.  he was started on Florinef by cardiologist with improvement of his blood pressure. I will start weaning him off IV fluid infusion over the next few weeks

## 2015-07-23 NOTE — Telephone Encounter (Signed)
Gave and printed appt sched and avs for pt for DEc °

## 2015-07-23 NOTE — Assessment & Plan Note (Signed)
He is dependent on tube feeds and is also getting some oral intake  We will get him swallow/speech assessment in the near future after his ENT evaluation.  if he can maintain oral intake 100%, we will get the feeding tube removed in the next visit

## 2015-07-23 NOTE — Telephone Encounter (Signed)
-----   Message from Skeet Latch, MD sent at 07/17/2015  2:54 PM EST ----- Urine is not very concentrated, indicating that he is not dehydrated.  Continue with florinef as planned. Please have a basic metabolic panel checked in 1 week to ensure electrolytes are OK.

## 2015-07-24 ENCOUNTER — Ambulatory Visit: Payer: Medicaid Other | Admitting: Nutrition

## 2015-07-24 ENCOUNTER — Ambulatory Visit (HOSPITAL_BASED_OUTPATIENT_CLINIC_OR_DEPARTMENT_OTHER): Payer: Medicaid Other

## 2015-07-24 VITALS — BP 112/83 | HR 87 | Temp 99.0°F | Resp 18

## 2015-07-24 DIAGNOSIS — E43 Unspecified severe protein-calorie malnutrition: Secondary | ICD-10-CM | POA: Diagnosis not present

## 2015-07-24 DIAGNOSIS — R070 Pain in throat: Secondary | ICD-10-CM

## 2015-07-24 DIAGNOSIS — C321 Malignant neoplasm of supraglottis: Secondary | ICD-10-CM | POA: Diagnosis not present

## 2015-07-24 DIAGNOSIS — I951 Orthostatic hypotension: Secondary | ICD-10-CM

## 2015-07-24 MED ORDER — HYDROMORPHONE HCL 1 MG/ML IJ SOLN
2.0000 mg | INTRAMUSCULAR | Status: DC | PRN
  Administered 2015-07-24: 2 mg via INTRAVENOUS
  Filled 2015-07-24: qty 2

## 2015-07-24 MED ORDER — HYDROMORPHONE HCL 4 MG/ML IJ SOLN
INTRAMUSCULAR | Status: AC
  Filled 2015-07-24: qty 1

## 2015-07-24 MED ORDER — SODIUM CHLORIDE 0.9 % IV SOLN
Freq: Once | INTRAVENOUS | Status: AC
  Administered 2015-07-24: 14:00:00 via INTRAVENOUS
  Filled 2015-07-24: qty 4

## 2015-07-24 MED ORDER — SODIUM CHLORIDE 0.9 % IV SOLN
Freq: Once | INTRAVENOUS | Status: AC
  Administered 2015-07-24: 14:00:00 via INTRAVENOUS

## 2015-07-24 MED ORDER — SODIUM CHLORIDE 0.9 % IJ SOLN
10.0000 mL | INTRAMUSCULAR | Status: DC | PRN
  Administered 2015-07-24: 10 mL
  Filled 2015-07-24: qty 10

## 2015-07-24 MED ORDER — HEPARIN SOD (PORK) LOCK FLUSH 100 UNIT/ML IV SOLN
500.0000 [IU] | Freq: Once | INTRAVENOUS | Status: AC | PRN
  Administered 2015-07-24: 500 [IU]
  Filled 2015-07-24: qty 5

## 2015-07-24 NOTE — Patient Instructions (Signed)

## 2015-07-24 NOTE — Progress Notes (Signed)
Nutrition follow-up completed with patient during IV fluids.  Patient has completed treatment for cancer of the Larynx. Patient continues to reside at [facility].  He is followed by their registered dietitian. Weight has improved and was documented as 135.4 pounds December 5 increased from 128 pounds October 21. Patient is tolerating Isosource 1.5 - 3 times a day through his feeding tube providing 1125 cal, 50.6 g protein, and 1482 mL of free water including free water flushes. Patient is consuming a pured diet along with Ensure Plus 3 times a day and MagicCup 3 times a day Oral intake of meals is approximately 50-100 %.  Nutrition diagnosis: Unintended weight loss improved.  Intervention: Patient educated to continue pured pudding thick liquid diet with good oral intake. Encouraged patient to continue enteral nutrition.  Feeds to provide additional calories and protein as well as free water. Teach back method used. No changes needed at this time.  Patient should continue present nutrition care plan.  Monitoring, evaluation, goals: Patient will continue increased calories and protein to promote weight gain/weight maintenance.  Next visit: Will be followed as needed.  **Disclaimer: This note was dictated with voice recognition software. Similar sounding words can inadvertently be transcribed and this note may contain transcription errors which may not have been corrected upon publication of note.**

## 2015-07-25 ENCOUNTER — Ambulatory Visit (HOSPITAL_BASED_OUTPATIENT_CLINIC_OR_DEPARTMENT_OTHER): Payer: Medicaid Other

## 2015-07-25 VITALS — BP 117/71 | HR 61 | Temp 98.2°F | Resp 18

## 2015-07-25 DIAGNOSIS — I951 Orthostatic hypotension: Secondary | ICD-10-CM | POA: Diagnosis present

## 2015-07-25 DIAGNOSIS — E43 Unspecified severe protein-calorie malnutrition: Secondary | ICD-10-CM

## 2015-07-25 DIAGNOSIS — C321 Malignant neoplasm of supraglottis: Secondary | ICD-10-CM | POA: Diagnosis not present

## 2015-07-25 DIAGNOSIS — R070 Pain in throat: Secondary | ICD-10-CM | POA: Diagnosis not present

## 2015-07-25 MED ORDER — HYDROMORPHONE HCL 4 MG/ML IJ SOLN
INTRAMUSCULAR | Status: AC
  Filled 2015-07-25: qty 1

## 2015-07-25 MED ORDER — SODIUM CHLORIDE 0.9 % IV SOLN
Freq: Once | INTRAVENOUS | Status: AC
  Administered 2015-07-25: 14:00:00 via INTRAVENOUS

## 2015-07-25 MED ORDER — SODIUM CHLORIDE 0.9 % IJ SOLN
10.0000 mL | INTRAMUSCULAR | Status: DC | PRN
  Administered 2015-07-25: 10 mL
  Filled 2015-07-25: qty 10

## 2015-07-25 MED ORDER — HEPARIN SOD (PORK) LOCK FLUSH 100 UNIT/ML IV SOLN
500.0000 [IU] | Freq: Once | INTRAVENOUS | Status: AC | PRN
  Administered 2015-07-25: 500 [IU]
  Filled 2015-07-25: qty 5

## 2015-07-25 MED ORDER — HYDROMORPHONE HCL 1 MG/ML IJ SOLN
2.0000 mg | INTRAMUSCULAR | Status: DC | PRN
  Administered 2015-07-25: 2 mg via INTRAVENOUS
  Filled 2015-07-25: qty 2

## 2015-07-25 MED ORDER — SODIUM CHLORIDE 0.9 % IV SOLN
Freq: Once | INTRAVENOUS | Status: AC
  Administered 2015-07-25: 14:00:00 via INTRAVENOUS
  Filled 2015-07-25: qty 4

## 2015-07-25 NOTE — Patient Instructions (Signed)

## 2015-07-26 ENCOUNTER — Ambulatory Visit (HOSPITAL_BASED_OUTPATIENT_CLINIC_OR_DEPARTMENT_OTHER): Payer: Medicaid Other

## 2015-07-26 VITALS — BP 113/68 | HR 70 | Temp 98.2°F | Resp 18

## 2015-07-26 DIAGNOSIS — C321 Malignant neoplasm of supraglottis: Secondary | ICD-10-CM

## 2015-07-26 DIAGNOSIS — E43 Unspecified severe protein-calorie malnutrition: Secondary | ICD-10-CM

## 2015-07-26 DIAGNOSIS — R070 Pain in throat: Secondary | ICD-10-CM | POA: Diagnosis not present

## 2015-07-26 DIAGNOSIS — I951 Orthostatic hypotension: Secondary | ICD-10-CM

## 2015-07-26 MED ORDER — SODIUM CHLORIDE 0.9 % IJ SOLN
10.0000 mL | INTRAMUSCULAR | Status: DC | PRN
  Filled 2015-07-26: qty 10

## 2015-07-26 MED ORDER — HYDROMORPHONE HCL 4 MG/ML IJ SOLN
INTRAMUSCULAR | Status: AC
  Filled 2015-07-26: qty 1

## 2015-07-26 MED ORDER — HEPARIN SOD (PORK) LOCK FLUSH 100 UNIT/ML IV SOLN
500.0000 [IU] | Freq: Once | INTRAVENOUS | Status: DC | PRN
  Filled 2015-07-26: qty 5

## 2015-07-26 MED ORDER — HYDROMORPHONE HCL 1 MG/ML IJ SOLN
2.0000 mg | INTRAMUSCULAR | Status: DC | PRN
  Administered 2015-07-26 (×2): 2 mg via INTRAVENOUS
  Filled 2015-07-26: qty 2

## 2015-07-26 MED ORDER — SODIUM CHLORIDE 0.9 % IV SOLN
Freq: Once | INTRAVENOUS | Status: AC
  Administered 2015-07-26: 15:00:00 via INTRAVENOUS
  Filled 2015-07-26: qty 4

## 2015-07-26 MED ORDER — SODIUM CHLORIDE 0.9 % IV SOLN
Freq: Once | INTRAVENOUS | Status: AC
  Administered 2015-07-26: 15:00:00 via INTRAVENOUS

## 2015-07-26 NOTE — Patient Instructions (Signed)

## 2015-07-27 ENCOUNTER — Ambulatory Visit (HOSPITAL_BASED_OUTPATIENT_CLINIC_OR_DEPARTMENT_OTHER): Payer: Medicaid Other

## 2015-07-27 ENCOUNTER — Ambulatory Visit (HOSPITAL_COMMUNITY): Payer: Medicaid Other | Attending: Cardiology

## 2015-07-27 ENCOUNTER — Other Ambulatory Visit: Payer: Self-pay

## 2015-07-27 VITALS — BP 120/81 | HR 86 | Temp 98.8°F | Resp 18

## 2015-07-27 DIAGNOSIS — C321 Malignant neoplasm of supraglottis: Secondary | ICD-10-CM | POA: Diagnosis not present

## 2015-07-27 DIAGNOSIS — I951 Orthostatic hypotension: Secondary | ICD-10-CM

## 2015-07-27 DIAGNOSIS — E43 Unspecified severe protein-calorie malnutrition: Secondary | ICD-10-CM

## 2015-07-27 DIAGNOSIS — F17200 Nicotine dependence, unspecified, uncomplicated: Secondary | ICD-10-CM | POA: Insufficient documentation

## 2015-07-27 MED ORDER — SODIUM CHLORIDE 0.9 % IV SOLN
Freq: Once | INTRAVENOUS | Status: AC
  Administered 2015-07-27: 15:00:00 via INTRAVENOUS
  Filled 2015-07-27: qty 4

## 2015-07-27 MED ORDER — HYDROMORPHONE HCL 4 MG/ML IJ SOLN
INTRAMUSCULAR | Status: AC
  Filled 2015-07-27: qty 1

## 2015-07-27 MED ORDER — SODIUM CHLORIDE 0.9 % IV SOLN
Freq: Once | INTRAVENOUS | Status: AC
  Administered 2015-07-27: 15:00:00 via INTRAVENOUS

## 2015-07-27 MED ORDER — HYDROMORPHONE HCL 1 MG/ML IJ SOLN
2.0000 mg | Freq: Once | INTRAMUSCULAR | Status: AC
  Administered 2015-07-27: 2 mg via INTRAVENOUS
  Filled 2015-07-27: qty 2

## 2015-07-27 MED ORDER — SODIUM CHLORIDE 0.9 % IJ SOLN
10.0000 mL | INTRAMUSCULAR | Status: DC | PRN
  Administered 2015-07-27: 10 mL
  Filled 2015-07-27: qty 10

## 2015-07-27 MED ORDER — HEPARIN SOD (PORK) LOCK FLUSH 100 UNIT/ML IV SOLN
500.0000 [IU] | Freq: Once | INTRAVENOUS | Status: AC | PRN
  Administered 2015-07-27: 500 [IU]
  Filled 2015-07-27: qty 5

## 2015-07-27 NOTE — Patient Instructions (Signed)

## 2015-07-31 ENCOUNTER — Ambulatory Visit (HOSPITAL_BASED_OUTPATIENT_CLINIC_OR_DEPARTMENT_OTHER): Payer: Medicaid Other

## 2015-07-31 VITALS — BP 102/62 | HR 77 | Temp 98.2°F | Resp 18

## 2015-07-31 DIAGNOSIS — C321 Malignant neoplasm of supraglottis: Secondary | ICD-10-CM

## 2015-07-31 DIAGNOSIS — E43 Unspecified severe protein-calorie malnutrition: Secondary | ICD-10-CM | POA: Diagnosis present

## 2015-07-31 MED ORDER — SODIUM CHLORIDE 0.9 % IV SOLN
Freq: Once | INTRAVENOUS | Status: AC
  Administered 2015-07-31: 14:00:00 via INTRAVENOUS
  Filled 2015-07-31: qty 4

## 2015-07-31 MED ORDER — HEPARIN SOD (PORK) LOCK FLUSH 100 UNIT/ML IV SOLN
500.0000 [IU] | Freq: Once | INTRAVENOUS | Status: AC | PRN
  Administered 2015-07-31: 500 [IU]
  Filled 2015-07-31: qty 5

## 2015-07-31 MED ORDER — SODIUM CHLORIDE 0.9 % IJ SOLN
10.0000 mL | INTRAMUSCULAR | Status: DC | PRN
  Administered 2015-07-31: 10 mL
  Filled 2015-07-31: qty 10

## 2015-07-31 MED ORDER — ALTEPLASE 2 MG IJ SOLR
2.0000 mg | Freq: Once | INTRAMUSCULAR | Status: DC | PRN
  Filled 2015-07-31: qty 2

## 2015-07-31 MED ORDER — HEPARIN SOD (PORK) LOCK FLUSH 100 UNIT/ML IV SOLN
250.0000 [IU] | Freq: Once | INTRAVENOUS | Status: DC | PRN
  Filled 2015-07-31: qty 5

## 2015-07-31 MED ORDER — SODIUM CHLORIDE 0.9 % IV SOLN
1000.0000 mL | Freq: Once | INTRAVENOUS | Status: AC
  Administered 2015-07-31: 1000 mL via INTRAVENOUS

## 2015-07-31 MED ORDER — HYDROMORPHONE HCL 4 MG/ML IJ SOLN
INTRAMUSCULAR | Status: AC
  Filled 2015-07-31: qty 1

## 2015-07-31 MED ORDER — HYDROMORPHONE HCL 1 MG/ML IJ SOLN
2.0000 mg | INTRAMUSCULAR | Status: DC | PRN
  Administered 2015-07-31: 2 mg via INTRAVENOUS
  Filled 2015-07-31: qty 2

## 2015-07-31 NOTE — Patient Instructions (Signed)

## 2015-08-03 ENCOUNTER — Ambulatory Visit (HOSPITAL_BASED_OUTPATIENT_CLINIC_OR_DEPARTMENT_OTHER): Payer: Medicaid Other

## 2015-08-03 VITALS — BP 117/75 | HR 74 | Temp 97.9°F | Resp 18

## 2015-08-03 DIAGNOSIS — E43 Unspecified severe protein-calorie malnutrition: Secondary | ICD-10-CM

## 2015-08-03 DIAGNOSIS — C321 Malignant neoplasm of supraglottis: Secondary | ICD-10-CM | POA: Diagnosis not present

## 2015-08-03 MED ORDER — HYDROMORPHONE HCL 4 MG/ML IJ SOLN
INTRAMUSCULAR | Status: AC
  Filled 2015-08-03: qty 1

## 2015-08-03 MED ORDER — HYDROMORPHONE HCL 4 MG/ML IJ SOLN
2.0000 mg | INTRAMUSCULAR | Status: DC | PRN
  Administered 2015-08-03: 2 mg via INTRAVENOUS

## 2015-08-03 MED ORDER — SODIUM CHLORIDE 0.9 % IJ SOLN
10.0000 mL | INTRAMUSCULAR | Status: DC | PRN
  Administered 2015-08-03: 10 mL
  Filled 2015-08-03: qty 10

## 2015-08-03 MED ORDER — SODIUM CHLORIDE 0.9 % IV SOLN
Freq: Once | INTRAVENOUS | Status: AC
  Administered 2015-08-03: 13:00:00 via INTRAVENOUS

## 2015-08-03 MED ORDER — SODIUM CHLORIDE 0.9 % IV SOLN
Freq: Once | INTRAVENOUS | Status: AC
  Administered 2015-08-03: 13:00:00 via INTRAVENOUS
  Filled 2015-08-03: qty 4

## 2015-08-03 MED ORDER — HEPARIN SOD (PORK) LOCK FLUSH 100 UNIT/ML IV SOLN
500.0000 [IU] | Freq: Once | INTRAVENOUS | Status: AC | PRN
  Administered 2015-08-03: 500 [IU]
  Filled 2015-08-03: qty 5

## 2015-08-03 NOTE — Patient Instructions (Signed)

## 2015-08-03 NOTE — Progress Notes (Signed)
Post vital signs stable.  No signs or symptoms of dizziness noted upon ambulation.  Patient discharged home with no complaints.

## 2015-08-07 ENCOUNTER — Encounter: Payer: Self-pay | Admitting: *Deleted

## 2015-08-07 ENCOUNTER — Ambulatory Visit (HOSPITAL_BASED_OUTPATIENT_CLINIC_OR_DEPARTMENT_OTHER): Payer: Medicaid Other

## 2015-08-07 VITALS — BP 107/79 | HR 64 | Temp 98.1°F | Resp 16

## 2015-08-07 DIAGNOSIS — E43 Unspecified severe protein-calorie malnutrition: Secondary | ICD-10-CM | POA: Diagnosis not present

## 2015-08-07 DIAGNOSIS — C321 Malignant neoplasm of supraglottis: Secondary | ICD-10-CM | POA: Diagnosis not present

## 2015-08-07 MED ORDER — SODIUM CHLORIDE 0.9 % IV SOLN
1000.0000 mL | Freq: Once | INTRAVENOUS | Status: AC
  Administered 2015-08-07: 1000 mL via INTRAVENOUS

## 2015-08-07 MED ORDER — HYDROMORPHONE HCL 4 MG/ML IJ SOLN
INTRAMUSCULAR | Status: AC
  Filled 2015-08-07: qty 1

## 2015-08-07 MED ORDER — SODIUM CHLORIDE 0.9 % IJ SOLN
10.0000 mL | INTRAMUSCULAR | Status: DC | PRN
  Administered 2015-08-07: 10 mL
  Filled 2015-08-07: qty 10

## 2015-08-07 MED ORDER — HYDROMORPHONE HCL 1 MG/ML IJ SOLN
2.0000 mg | INTRAMUSCULAR | Status: DC | PRN
  Administered 2015-08-07: 2 mg via INTRAVENOUS
  Filled 2015-08-07: qty 2

## 2015-08-07 MED ORDER — SODIUM CHLORIDE 0.9 % IV SOLN
Freq: Once | INTRAVENOUS | Status: AC
  Administered 2015-08-07: 13:00:00 via INTRAVENOUS
  Filled 2015-08-07: qty 4

## 2015-08-07 MED ORDER — HEPARIN SOD (PORK) LOCK FLUSH 100 UNIT/ML IV SOLN
500.0000 [IU] | Freq: Once | INTRAVENOUS | Status: AC | PRN
  Administered 2015-08-07: 500 [IU]
  Filled 2015-08-07: qty 5

## 2015-08-07 NOTE — Patient Instructions (Signed)

## 2015-08-07 NOTE — Progress Notes (Signed)
  Oncology Nurse Navigator Documentation   Navigator Encounter Type: Treatment;3 month (IVF) (08/07/15 1415) Patient Visit Type: Follow-up (08/07/15 1415)   Conducted 62-month post-tmt follow-up visit with patient in Infusion where he was receiving scheduled IVF.  He has a walker. He reported:  Less frequent episodes of dizziness when ambulating since starting fludrocortisone.  Helene Kelp is requiring his use of a walker until his BP stabilizes.  He has a 31-month follow-up appt with his cardiologist on 12/29.  Oral nutrition improving somewhat, "eat mashed potatoes every day".  He is instilling 3 cans of supplement every evening (at hourly intervals).  "I'm going to try to do 4 cans." He did not express any needs or concerns at this time, I encouraged him to contact me if that changes, he verbalized understanding.  [name], RN, BSN, [facility] [phone]                       Time Spent with Patient: 15 (08/07/15 1415)

## 2015-08-07 NOTE — Progress Notes (Signed)
Clean gauze dressing applied to Gtube site. Dried brown discharge noted on old dressing. Pt reports he changes dressing every other day.

## 2015-08-08 ENCOUNTER — Telehealth: Payer: Self-pay | Admitting: *Deleted

## 2015-08-08 NOTE — Telephone Encounter (Signed)
-----   Message from Skeet Latch, MD sent at 08/07/2015 12:31 AM EST ----- Normal echo.

## 2015-08-08 NOTE — Telephone Encounter (Signed)
Left message to call back- regarding results- echo

## 2015-08-09 NOTE — Telephone Encounter (Signed)
Spoke to patient.  Echo Result given . Verbalized understanding  

## 2015-08-10 ENCOUNTER — Ambulatory Visit (HOSPITAL_BASED_OUTPATIENT_CLINIC_OR_DEPARTMENT_OTHER): Payer: Medicaid Other

## 2015-08-10 VITALS — BP 97/65 | HR 90 | Temp 98.2°F | Resp 18

## 2015-08-10 DIAGNOSIS — C321 Malignant neoplasm of supraglottis: Secondary | ICD-10-CM | POA: Diagnosis not present

## 2015-08-10 DIAGNOSIS — E43 Unspecified severe protein-calorie malnutrition: Secondary | ICD-10-CM | POA: Diagnosis present

## 2015-08-10 MED ORDER — HYDROMORPHONE HCL 1 MG/ML IJ SOLN
2.0000 mg | INTRAMUSCULAR | Status: DC | PRN
  Administered 2015-08-10: 2 mg via INTRAVENOUS
  Filled 2015-08-10: qty 2

## 2015-08-10 MED ORDER — SODIUM CHLORIDE 0.9 % IJ SOLN
10.0000 mL | INTRAMUSCULAR | Status: DC | PRN
  Administered 2015-08-10: 10 mL
  Filled 2015-08-10: qty 10

## 2015-08-10 MED ORDER — SODIUM CHLORIDE 0.9 % IV SOLN
Freq: Once | INTRAVENOUS | Status: AC
  Administered 2015-08-10: 09:00:00 via INTRAVENOUS

## 2015-08-10 MED ORDER — HEPARIN SOD (PORK) LOCK FLUSH 100 UNIT/ML IV SOLN
500.0000 [IU] | Freq: Once | INTRAVENOUS | Status: AC | PRN
  Administered 2015-08-10: 500 [IU]
  Filled 2015-08-10: qty 5

## 2015-08-10 MED ORDER — HYDROMORPHONE HCL 4 MG/ML IJ SOLN
INTRAMUSCULAR | Status: AC
  Filled 2015-08-10: qty 1

## 2015-08-10 MED ORDER — SODIUM CHLORIDE 0.9 % IV SOLN
Freq: Once | INTRAVENOUS | Status: AC
  Administered 2015-08-10: 09:00:00 via INTRAVENOUS
  Filled 2015-08-10: qty 4

## 2015-08-10 NOTE — Patient Instructions (Signed)

## 2015-08-14 ENCOUNTER — Ambulatory Visit (HOSPITAL_BASED_OUTPATIENT_CLINIC_OR_DEPARTMENT_OTHER): Payer: Medicaid Other

## 2015-08-14 VITALS — BP 116/58 | HR 89 | Temp 98.5°F

## 2015-08-14 DIAGNOSIS — C321 Malignant neoplasm of supraglottis: Secondary | ICD-10-CM

## 2015-08-14 DIAGNOSIS — E43 Unspecified severe protein-calorie malnutrition: Secondary | ICD-10-CM | POA: Diagnosis present

## 2015-08-14 MED ORDER — SODIUM CHLORIDE 0.9 % IV SOLN
Freq: Once | INTRAVENOUS | Status: AC
  Administered 2015-08-14: 15:00:00 via INTRAVENOUS

## 2015-08-14 MED ORDER — HYDROMORPHONE HCL 1 MG/ML IJ SOLN
2.0000 mg | INTRAMUSCULAR | Status: DC | PRN
Start: 2015-08-14 — End: 2015-08-14
  Administered 2015-08-14: 2 mg via INTRAVENOUS
  Filled 2015-08-14: qty 2

## 2015-08-14 MED ORDER — SODIUM CHLORIDE 0.9 % IJ SOLN
10.0000 mL | INTRAMUSCULAR | Status: DC | PRN
  Administered 2015-08-14: 10 mL
  Filled 2015-08-14: qty 10

## 2015-08-14 MED ORDER — HYDROMORPHONE HCL 4 MG/ML IJ SOLN
INTRAMUSCULAR | Status: AC
Start: 2015-08-14 — End: 2015-08-14
  Filled 2015-08-14: qty 1

## 2015-08-14 MED ORDER — HEPARIN SOD (PORK) LOCK FLUSH 100 UNIT/ML IV SOLN
500.0000 [IU] | Freq: Once | INTRAVENOUS | Status: AC | PRN
Start: 2015-08-14 — End: 2015-08-14
  Administered 2015-08-14: 500 [IU]
  Filled 2015-08-14: qty 5

## 2015-08-14 MED ORDER — SODIUM CHLORIDE 0.9 % IV SOLN
Freq: Once | INTRAVENOUS | Status: AC
  Administered 2015-08-14: 15:00:00 via INTRAVENOUS
  Filled 2015-08-14: qty 4

## 2015-08-14 MED ORDER — ALTEPLASE 2 MG IJ SOLR
2.0000 mg | Freq: Once | INTRAMUSCULAR | Status: DC | PRN
  Filled 2015-08-14: qty 2

## 2015-08-14 NOTE — Patient Instructions (Signed)

## 2015-08-14 NOTE — Progress Notes (Signed)
Pain and nausea resolved within 30 min of Dilaudid and Zofran. BP better after 1 liter of fluids.

## 2015-08-16 ENCOUNTER — Ambulatory Visit (INDEPENDENT_AMBULATORY_CARE_PROVIDER_SITE_OTHER): Payer: Medicaid Other | Admitting: Cardiovascular Disease

## 2015-08-16 ENCOUNTER — Encounter: Payer: Self-pay | Admitting: Cardiovascular Disease

## 2015-08-16 VITALS — BP 104/78 | HR 95 | Ht 73.0 in | Wt 136.0 lb

## 2015-08-16 DIAGNOSIS — I951 Orthostatic hypotension: Secondary | ICD-10-CM

## 2015-08-16 DIAGNOSIS — Z79899 Other long term (current) drug therapy: Secondary | ICD-10-CM

## 2015-08-16 MED ORDER — FLUDROCORTISONE ACETATE 0.1 MG PO TABS: 0.2000 mg | ORAL_TABLET | Freq: Every day | ORAL | Status: DC

## 2015-08-16 NOTE — Progress Notes (Signed)
Cardiology Office Note   Date:  08/16/2015   ID:  Marvin [name], DOB Feb 15, 1976, MRN [id number]  PCP:  No PCP Per Patient  Cardiologist:   Sharol Harness, MD   Chief Complaint  Patient presents with  . Follow-up  . Shortness of Breath      Patient ID: Marvin [name] is a 39 y.o. 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    Interval History 08/16/15: After his last appointment Marvin [name] had an echo that was normal.  He was instructed to increase his G-tube flushes and start Florinef 0.1 mg daily.  Since that appointment Marvin [name] has been doing well. He continues to get 1 L of normal saline on Tuesdays and Fridays.  However this is much improved, as he was getting 2 L of fluid daily.  He reports eating 3 small meals daily. He also uses 3-4 cans of Jevity at night, but he does not always received these from his rehabilitation facility. He tries to also use boost and Ensure.  He denies chest pain, shortness of breath, lightheadedness, dizziness or palpitations. He has not been admitted to the hospital.  He is unsure of when he will be discharged from his rehabilitation facility.  History of Present Illness 07/16/15: Marvin [name] received 3 cycles of high-dose Cisplatin from 03/21/15 through 05/04/15.  Following this therapy he has experienced persistent orthostatic hypotension.  He was noted to be hypotensive and tachycardic at his infusion center appointment on 11/22.  His blood pressure was 88/66 and his heart rate was 117 bpm.  His vitals improved with IV fluids, as they typically do.  Marvin [name] was referred to cardiology for management of orthostatic hypotension.  He has some oral intake as well as three cans of supplemental nutrition via g-tube daily.  He reports eating 3 meals of soft food daily.  He also reports drinking 4 large  bottles of water daily.  His cortisol level has been checked and was within normal limits.  He also had his CBC checked, which did reveal mild anemia (Hct 32.3) but no leukocytosis or leukopenia.  Marvin [name] has suffered from stomatitis and his pain has been poorly-controlled.  He has been receiving both IV and PO dilaudid.  He has also been receiving 2L of IV fluids daily for 3 weeks.  His trach was decannulated on 11/2.  This am he was dizzy prior to receiving his infusion of fluid.  He only received 1L of saline today. His dizziness has improved but he does not feel that his baseline.  Symptoms are typically worse in the morning and improve after receiving IV fluids. He denies chest pain, shortness of breath, lower extremity edema, orthopnea or PND.  Past Medical History  Diagnosis Date  . ETOH abuse   . Alcohol dependence ([place]) 04/17/2012  . COPD (chronic obstructive pulmonary disease) ([place])   . Shortness of breath dyspnea     occ  . Anxiety   . GERD (gastroesophageal reflux disease)   . Seizures ([place])     one episode 09/2012, suspected related to ETOH withdrawal  . Anxiety disorder 03/12/2015  . Headache 05/10/2015  . Hypokalemia 05/10/2015  . Anemia due to chemotherapy 05/11/2015    Past Surgical History  Procedure Laterality Date  . Knee surgery Left     kneecap -screws  . Appendectomy    .  Tracheostomy tube placement N/A 02/22/2015    Procedure: TRACHEOSTOMY;  Surgeon: [name], MD;  Location: [facility];  Service: ENT;  Laterality: N/A;  awake tracheostomy  . Direct laryngoscopy N/A 02/22/2015    Procedure: DIRECT LARYNGOSCOPY;  Surgeon: [name], MD;  Location: [facility];  Service: ENT;  Laterality: N/A;  direct laryngoscopy with biopsy  . Multiple extractions with alveoloplasty N/A 03/05/2015    Procedure: Extraction of tooth #'s 1,2,8,9,15,16,17,18,31,32 with alveoloplasty, mandibular right lateral exostoses, and gross debridement of remaining teeth;  Surgeon: Lenn Cal, DDS;   Location: [facility];  Service: Oral Surgery;  Laterality: N/A;     Current Outpatient Prescriptions  Medication Sig Dispense Refill  . bisacodyl (DULCOLAX) 10 MG suppository Place 10 mg rectally as needed for moderate constipation.    . citalopram (CELEXA) 20 MG tablet 20 mg by PEG Tube route daily.    . fentaNYL (DURAGESIC - DOSED MCG/HR) 50 MCG/HR Apply one patch topically every 72 hours for pain. Remove old patch. External use only. 10 patch 0  . fludrocortisone (FLORINEF) 0.1 MG tablet Take 2 tablets (0.2 mg total) by mouth daily. 60 tablet 11  . folic acid (FOLVITE) 1 MG tablet Take 1 tablet (1 mg total) by mouth daily. 30 tablet 0  . HYDROmorphone (DILAUDID) 4 MG tablet Take 1 tablets 30 minutes before meals, up to maximum 6 tablets per day if needed 60 tablet 0  . ibuprofen (ADVIL,MOTRIN) 100 MG/5ML suspension Take 200-400 mg by mouth every 4 (four) hours as needed for mild pain.     Marland Kitchen lidocaine (XYLOCAINE) 2 % solution Mix 1 part 2%viscous lidocaine,1part H2O.Swish and/or swallow 64ml of this mixture,22min before meals and at bedtime, up to QID (Patient taking differently: Mix 1 part 2%viscous lidocaine,1part H2O.Swish and/or swallow 47ml of this mixture,51min before meals and at bedtime, up to QID as needed for sore throat) 100 mL 5  . lidocaine-prilocaine (EMLA) cream Apply 1 application topically as needed (FOR PORT ACCESS).    . LORazepam (ATIVAN) 1 MG tablet Take 0.5 mg by mouth 3 (three) times daily.     . magnesium hydroxide (MILK OF MAGNESIA) 400 MG/5ML suspension Take 30 mLs by mouth daily as needed for mild constipation.    Marland Kitchen morphine (ROXANOL) 20 MG/ML concentrated solution Take 1 mL (20 mg total) by mouth every 2 (two) hours as needed for moderate pain or severe pain. 240 mL 0  . Multiple Vitamin (MULTIVITAMIN WITH MINERALS) TABS tablet Take 1 tablet by mouth daily. 30 tablet 0  . omeprazole (PRILOSEC) 20 MG capsule 20 mg by PEG Tube route daily.    . ondansetron (ZOFRAN) 8 MG  tablet 8 mg by PEG Tube route every 8 (eight) hours as needed for nausea or vomiting.    . pravastatin (PRAVACHOL) 20 MG tablet 20 mg by PEG Tube route daily.    . prochlorperazine (COMPAZINE) 10 MG tablet 10 mg by PEG Tube route every 6 (six) hours as needed for nausea or vomiting.    . sodium fluoride (PREVIDENT 5000 PLUS) 1.1 % CREA dental cream Apply thin ribbon of cream to tooth brush. Brush teeth for 2 minutes. Spit out excess-DO NOT swallow. DO NOT rinse afterwards. Repeat nightly. 1 Tube prn  . sodium phosphate (FLEET) enema Place 1 enema rectally once as needed (for constipation). follow package directions    . sucralfate (CARAFATE) 1 G tablet Dissolve 1 tablet in 46ml H2O and swallow up to QID,PRN sore throat. 60 tablet 5  .  thiamine 100 MG tablet Take 1 tablet (100 mg total) by mouth daily. 30 tablet 0   No current facility-administered medications for this visit.   Facility-Administered Medications Ordered in Other Visits  Medication Dose Route Frequency Provider Last Rate Last Dose  . 0.9 %  sodium chloride infusion   Intravenous Once [name], MD      . HYDROmorphone (DILAUDID) injection 2 mg  2 mg Intravenous Q2H PRN Ni Gorsuch, MD      . sodium chloride 0.9 % injection 10 mL  10 mL Intracatheter PRN Ni Gorsuch, MD      . sodium chloride 0.9 % injection 10 mL  10 mL Intracatheter PRN [name], MD        Allergies:   Review of patient's allergies indicates no known allergies.    Social History:  The patient  reports that he quit smoking about 6 months ago. His smoking use included Cigarettes. He has a 28 pack-year smoking history. He has never used smokeless tobacco. He reports that he drinks alcohol. He reports that he does not use illicit drugs.   Family History:  The patient's family history includes Cancer in his mother and paternal grandfather; Heart attack in his maternal grandfather and maternal grandmother.    ROS:  Please see the history of present illness.    Otherwise, review of systems are positive for none.   All other systems are reviewed and negative.    PHYSICAL EXAM: VS:  BP 104/78 mmHg  Pulse 95  Ht 6\' 1"  (1.854 m)  Wt 61.689 kg (136 lb)  BMI 17.95 kg/m2 , BMI Body mass index is 17.95 kg/(m^2).   Supine BP 106/75 HR 83 Sitting BP 101/69 HR 92 Standing immediate BP 83/55 HR 119 Standing at 3 min BP 96/68, HR 121  GENERAL: Chronically ill-appearing.  Thin.  HEENT:  Pupils equal round and reactive, fundi not visualized, oral mucosa unremarkable.  MM moist. NECK:  No jugular venous distention, waveform within normal limits, carotid upstroke brisk and symmetric, no bruits, no thyromegaly. 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 LYMPHATICS:  No cervical adenopathy LUNGS:  Clear to auscultation bilaterally HEART:  RRR.  PMI not displaced or sustained,S1 and S2 within normal limits, no S3, no S4, no clicks, no rubs, no murmurs ABD:  Flat, positive bowel sounds normal in frequency in pitch, no bruits, no rebound, no guarding, no midline pulsatile mass, no hepatomegaly, no splenomegaly EXT:  2 plus pulses throughout, no edema, no cyanosis no clubbing SKIN:  No rashes no nodules NEURO:  Cranial nerves II through XII grossly intact, motor grossly intact throughout PSYCH:  Cognitively intact, oriented to person place and time    EKG:  EKG is not ordered today.  Echo 07/27/15: Study Conclusions  - Left ventricle: The cavity size was normal. Wall thickness was normal. Systolic function was normal. The estimated ejection fraction was in the range of 60% to 65%. Wall motion was normal; there were no regional wall motion abnormalities. Left ventricular diastolic function parameters were normal. - Right ventricle: The cavity size was normal. Wall thickness was normal. Recent Labs: 06/04/2015: TSH 0.529 06/11/2015: ALT <9; BUN 12.9; Creatinine 0.8; HGB 11.0*; Magnesium 1.8; Platelets 274; Potassium 4.2; Sodium 138    Lipid Panel No  results found for: CHOL, TRIG, HDL, CHOLHDL, VLDL, LDLCALC, LDLDIRECT    Wt Readings from Last 3 Encounters:  08/16/15 61.689 kg (136 lb)  07/23/15 61.417 kg (135 lb 6.4 oz)  07/18/15 62.687 kg (138 lb  3.2 oz)      ASSESSMENT AND PLAN:  # Hypotension: Symptoms re much improved. He does continue to need IV fluids twice per week. He is no longer symptomatic We checked orthostatic vitals today and he remained orthostatic, so we will increase Florinef to 0.2 mg daily. We will also check a basic metabolic panel to assess his electrolytes and renal function on Florinef.  We discussed the importance of increasing his PO and via tube intake as much as possible.  Current medicines are reviewed at length with the patient today.  The patient does not have concerns regarding medicines.  The following changes have been made:  Increase florinef to 0.2mg  daily.  Labs/ tests ordered today include:   Orders Placed This Encounter  Procedures  . Basic Metabolic Panel (BMET)     Disposition:   FU with Brinton Brandel C. Oval Linsey, MD in 3 months.    Signed, Sharol Harness, MD  08/16/2015 5:28 PM    [place]

## 2015-08-16 NOTE — Patient Instructions (Addendum)
Your physician wants you to follow-up in: 3 Months. You will receive a reminder letter in the mail two months in advance. If you don't receive a letter, please call our office to schedule the follow-up appointment.  Your physician recommends that you return for lab work in: [facility]  Your physician has recommended you make the following change in your medication: Increase Florinef 0.2 mg daily

## 2015-08-17 ENCOUNTER — Telehealth: Payer: Self-pay | Admitting: *Deleted

## 2015-08-17 ENCOUNTER — Telehealth: Payer: Self-pay | Admitting: Hematology and Oncology

## 2015-08-17 ENCOUNTER — Ambulatory Visit (HOSPITAL_BASED_OUTPATIENT_CLINIC_OR_DEPARTMENT_OTHER): Payer: Medicaid Other | Admitting: Hematology and Oncology

## 2015-08-17 ENCOUNTER — Encounter: Payer: Self-pay | Admitting: Hematology and Oncology

## 2015-08-17 ENCOUNTER — Ambulatory Visit (HOSPITAL_BASED_OUTPATIENT_CLINIC_OR_DEPARTMENT_OTHER): Payer: Medicaid Other

## 2015-08-17 VITALS — BP 109/71 | HR 93 | Temp 98.4°F | Resp 18 | Ht 73.0 in | Wt 137.5 lb

## 2015-08-17 DIAGNOSIS — Z931 Gastrostomy status: Secondary | ICD-10-CM | POA: Diagnosis not present

## 2015-08-17 DIAGNOSIS — K1231 Oral mucositis (ulcerative) due to antineoplastic therapy: Secondary | ICD-10-CM

## 2015-08-17 DIAGNOSIS — C321 Malignant neoplasm of supraglottis: Secondary | ICD-10-CM | POA: Diagnosis not present

## 2015-08-17 DIAGNOSIS — I951 Orthostatic hypotension: Secondary | ICD-10-CM

## 2015-08-17 DIAGNOSIS — E46 Unspecified protein-calorie malnutrition: Secondary | ICD-10-CM

## 2015-08-17 DIAGNOSIS — E43 Unspecified severe protein-calorie malnutrition: Secondary | ICD-10-CM

## 2015-08-17 DIAGNOSIS — E860 Dehydration: Secondary | ICD-10-CM

## 2015-08-17 LAB — BASIC METABOLIC PANEL WITH GFR
BUN: 13 mg/dL (ref 7–25)
CO2: 28 mmol/L (ref 20–31)
Calcium: 9.2 mg/dL (ref 8.6–10.3)
Chloride: 104 mmol/L (ref 98–110)
Creat: 0.65 mg/dL (ref 0.60–1.35)
Glucose, Bld: 87 mg/dL (ref 65–99)
Potassium: 4.1 mmol/L (ref 3.5–5.3)
Sodium: 142 mmol/L (ref 135–146)

## 2015-08-17 MED ORDER — SODIUM CHLORIDE 0.9 % IJ SOLN
10.0000 mL | INTRAMUSCULAR | Status: DC | PRN
  Administered 2015-08-17: 10 mL via INTRAVENOUS
  Filled 2015-08-17: qty 10

## 2015-08-17 MED ORDER — HYDROMORPHONE HCL 4 MG/ML IJ SOLN
INTRAMUSCULAR | Status: AC
  Filled 2015-08-17: qty 1

## 2015-08-17 MED ORDER — HEPARIN SOD (PORK) LOCK FLUSH 100 UNIT/ML IV SOLN
500.0000 [IU] | Freq: Once | INTRAVENOUS | Status: AC
  Administered 2015-08-17: 500 [IU] via INTRAVENOUS
  Filled 2015-08-17: qty 5

## 2015-08-17 MED ORDER — SODIUM CHLORIDE 0.9 % IV SOLN
Freq: Once | INTRAVENOUS | Status: AC
  Administered 2015-08-17: 10:00:00 via INTRAVENOUS

## 2015-08-17 MED ORDER — HYDROMORPHONE HCL 1 MG/ML IJ SOLN
2.0000 mg | INTRAMUSCULAR | Status: DC | PRN
  Administered 2015-08-17: 2 mg via INTRAVENOUS
  Filled 2015-08-17: qty 2

## 2015-08-17 NOTE — Assessment & Plan Note (Signed)
His pain is well controlled. He felt that the Dilaudid is helpful and will continue the same

## 2015-08-17 NOTE — Assessment & Plan Note (Signed)
He tolerated treatment very well with objective response to treatment, with resolution of the lymphadenopathy. I will wean off IV fluid support for him for the next 2 weeks. He has appointment to go back to ENT in the near future. He has the tracehostomy removed recently and started to gain weight. He has PET scan scheduled for next month. I will continue to provide supportive care

## 2015-08-17 NOTE — Progress Notes (Signed)
[facility] OFFICE PROGRESS NOTE  Patient Care Team: No Pcp Per Patient as PCP - General (General Practice) [name], MD as Consulting Physician (Otolaryngology) [name], MD as Attending Physician (Radiation Oncology) [name], MD as Consulting Physician (Hematology and Oncology) Leota Sauers, RN as Oncology Nurse Navigator Lenn Cal, DDS as Consulting Physician (Dentistry)  SUMMARY OF ONCOLOGIC HISTORY: Oncology History   Carcinoma of supraglottis   Staging form: Larynx - Supraglottis, AJCC 7th Edition     Clinical stage from 03/13/2015: Stage IVA (T3, N2b, M0) - Signed by [name], MD on 03/13/2015     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[place])   01/22/2015 Imaging CT scan showed enhancing infiltrative mass centered at the right piriform sinus with bilateral neck lymphadenopathy   02/22/2015 - 03/08/2015 Hospital Admission He was admitted to the hospital for management of advanced supraglottic cancer status post tracheostomy, biopsy, dental extraction, placement of feeding tube and subsequent discharge to skilled nursing facility   02/22/2015 Pathology Results Accession: [id number] 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   03/09/2015 PET scan 1)  Right piriform sinus mass with right level 2 nodal metastasis.  2)  Hypermetabolic thoracic nodes which are at least partially felt to be reactive.  3)  No evidence of subdiaphragmatic metastatic disease.    03/16/2015 Procedure He has placement of port   03/21/2015 - 05/04/2015 Chemotherapy He received high dose cisplatin x 3 cycles   03/21/2015 - 05/09/2015 Radiation Therapy Tomotherapy to supraglottic tumor and bilateral neck:  70 Gy in 35 fractions to gross disease, 63 Gy in 35 fractions to high risk nodal echelons, and 56 Gy in 35 fractions to intermediate risk nodal echelons.   05/09/2015 Miscellaneous He went  to the ED with fall    INTERVAL HISTORY: Please see below for problem oriented charting.  he returns for further follow-up. He feels well with less dizziness after he begin treatment with Florinef. He still had persistent mild pain at prior tracheostomy sites but it is better and his pain is well controlled. He is eating almost everything by mouth and is not really using his feeding tube.  REVIEW OF SYSTEMS:   Constitutional: Denies fevers, chills or abnormal weight loss Eyes: Denies blurriness of vision Ears, nose, mouth, throat, and face: Denies mucositis or sore throat Respiratory: Denies cough, dyspnea or wheezes Cardiovascular: Denies palpitation, chest discomfort or lower extremity swelling Gastrointestinal:  Denies nausea, heartburn or change in bowel habits Skin: Denies abnormal skin rashes Lymphatics: Denies new lymphadenopathy or easy bruising Neurological:Denies numbness, tingling or new weaknesses Behavioral/Psych: Mood is stable, no new changes  All other systems were reviewed with the patient and are negative.  I have reviewed the past medical history, past surgical history, social history and family history with the patient and they are unchanged from previous note.  ALLERGIES:  has No Known Allergies.  MEDICATIONS:  Current Outpatient Prescriptions  Medication Sig Dispense Refill  . bisacodyl (DULCOLAX) 10 MG suppository Place 10 mg rectally as needed for moderate constipation.    . citalopram (CELEXA) 20 MG tablet 20 mg by PEG Tube route daily.    . fentaNYL (DURAGESIC - DOSED MCG/HR) 50 MCG/HR Apply one patch topically every 72 hours for pain. Remove old patch. External use only. 10 patch 0  . fludrocortisone (FLORINEF) 0.1 MG tablet Take 2 tablets (0.2 mg total) by mouth daily. 60 tablet 11  .  folic acid (FOLVITE) 1 MG tablet Take 1 tablet (1 mg total) by mouth daily. 30 tablet 0  . HYDROmorphone (DILAUDID) 4 MG tablet Take 1 tablets 30 minutes before meals, up  to maximum 6 tablets per day if needed 60 tablet 0  . ibuprofen (ADVIL,MOTRIN) 100 MG/5ML suspension Take 200-400 mg by mouth every 4 (four) hours as needed for mild pain.     Marland Kitchen lidocaine (XYLOCAINE) 2 % solution Mix 1 part 2%viscous lidocaine,1part H2O.Swish and/or swallow 73ml of this mixture,35min before meals and at bedtime, up to QID (Patient taking differently: Mix 1 part 2%viscous lidocaine,1part H2O.Swish and/or swallow 35ml of this mixture,39min before meals and at bedtime, up to QID as needed for sore throat) 100 mL 5  . lidocaine-prilocaine (EMLA) cream Apply 1 application topically as needed (FOR PORT ACCESS).    . LORazepam (ATIVAN) 1 MG tablet Take 0.5 mg by mouth 3 (three) times daily.     . magnesium hydroxide (MILK OF MAGNESIA) 400 MG/5ML suspension Take 30 mLs by mouth daily as needed for mild constipation.    Marland Kitchen morphine (ROXANOL) 20 MG/ML concentrated solution Take 1 mL (20 mg total) by mouth every 2 (two) hours as needed for moderate pain or severe pain. 240 mL 0  . Multiple Vitamin (MULTIVITAMIN WITH MINERALS) TABS tablet Take 1 tablet by mouth daily. 30 tablet 0  . omeprazole (PRILOSEC) 20 MG capsule 20 mg by PEG Tube route daily.    . pravastatin (PRAVACHOL) 20 MG tablet 20 mg by PEG Tube route daily.    . sodium fluoride (PREVIDENT 5000 PLUS) 1.1 % CREA dental cream Apply thin ribbon of cream to tooth brush. Brush teeth for 2 minutes. Spit out excess-DO NOT swallow. DO NOT rinse afterwards. Repeat nightly. 1 Tube prn  . sodium phosphate (FLEET) enema Place 1 enema rectally once as needed (for constipation). follow package directions    . sucralfate (CARAFATE) 1 G tablet Dissolve 1 tablet in 47ml H2O and swallow up to QID,PRN sore throat. 60 tablet 5  . thiamine 100 MG tablet Take 1 tablet (100 mg total) by mouth daily. 30 tablet 0  . ondansetron (ZOFRAN) 8 MG tablet 8 mg by PEG Tube route every 8 (eight) hours as needed for nausea or vomiting. Reported on 08/17/2015    .  prochlorperazine (COMPAZINE) 10 MG tablet 10 mg by PEG Tube route every 6 (six) hours as needed for nausea or vomiting. Reported on 08/17/2015     No current facility-administered medications for this visit.   Facility-Administered Medications Ordered in Other Visits  Medication Dose Route Frequency Provider Last Rate Last Dose  . 0.9 %  sodium chloride infusion   Intravenous Once [name], MD      . HYDROmorphone (DILAUDID) injection 2 mg  2 mg Intravenous Q2H PRN Seniah Lawrence, MD      . sodium chloride 0.9 % injection 10 mL  10 mL Intracatheter PRN [name], MD      . sodium chloride 0.9 % injection 10 mL  10 mL Intracatheter PRN [name], MD        PHYSICAL EXAMINATION: ECOG PERFORMANCE STATUS: 0 - Asymptomatic  Filed Vitals:   08/17/15 0930  BP: 109/71  Pulse: 93  Temp: 98.4 F (36.9 C)  Resp: 18   Filed Weights   08/17/15 0930  Weight: 137 lb 8 oz (62.37 kg)    GENERAL:alert, no distress and comfortable SKIN: skin color, texture, turgor are normal, no rashes or significant lesions  EYES: normal, Conjunctiva are pink and non-injected, sclera clear OROPHARYNX:no exudate, no erythema and lips, buccal mucosa, and tongue normal  NECK: supple, thyroid normal size, non-tender, without nodularity LYMPH:  no palpable lymphadenopathy in the cervical, axillary or inguinal LUNGS: clear to auscultation and percussion with normal breathing effort HEART: regular rate & rhythm and no murmurs and no lower extremity edema ABDOMEN:abdomen soft, non-tender and normal bowel sounds Musculoskeletal:no cyanosis of digits and no clubbing  NEURO: alert & oriented x 3 with fluent speech, no focal motor/sensory deficits  LABORATORY DATA:  I have reviewed the data as listed    Component Value Date/Time   NA 142 08/16/2015 1538   NA 138 06/11/2015 1005   NA 137 03/15/2015   K 4.1 08/16/2015 1538   K 4.2 06/11/2015 1005   CL 104 08/16/2015 1538   CO2 28 08/16/2015 1538   CO2 27 06/11/2015  1005   GLUCOSE 87 08/16/2015 1538   GLUCOSE 108 06/11/2015 1005   BUN 13 08/16/2015 1538   BUN 12.9 06/11/2015 1005   BUN 9 03/15/2015   CREATININE 0.65 08/16/2015 1538   CREATININE 0.8 06/11/2015 1005   CREATININE 0.5* 03/15/2015   CREATININE 0.65 02/22/2015 1731   CALCIUM 9.2 08/16/2015 1538   CALCIUM 10.0 06/11/2015 1005   PROT 7.4 06/11/2015 1005   PROT 7.7 02/14/2015 1439   ALBUMIN 3.6 06/11/2015 1005   ALBUMIN 3.3* 02/14/2015 1439   AST 17 06/11/2015 1005   AST 56* 02/14/2015 1439   ALT <9 06/11/2015 1005   ALT 27 02/14/2015 1439   ALKPHOS 117 06/11/2015 1005   ALKPHOS 113 02/14/2015 1439   BILITOT 0.36 06/11/2015 1005   BILITOT 0.4 02/14/2015 1439   GFRNONAA >60 02/22/2015 1731   GFRAA >60 02/22/2015 1731    No results found for: SPEP, UPEP  Lab Results  Component Value Date   WBC 6.1 06/11/2015   NEUTROABS 4.3 06/11/2015   HGB 11.0* 06/11/2015   HCT 32.3* 06/11/2015   MCV 92.2 06/11/2015   PLT 274 06/11/2015      Chemistry      Component Value Date/Time   NA 142 08/16/2015 1538   NA 138 06/11/2015 1005   NA 137 03/15/2015   K 4.1 08/16/2015 1538   K 4.2 06/11/2015 1005   CL 104 08/16/2015 1538   CO2 28 08/16/2015 1538   CO2 27 06/11/2015 1005   BUN 13 08/16/2015 1538   BUN 12.9 06/11/2015 1005   BUN 9 03/15/2015   CREATININE 0.65 08/16/2015 1538   CREATININE 0.8 06/11/2015 1005   CREATININE 0.5* 03/15/2015   CREATININE 0.65 02/22/2015 1731   GLU 86 03/15/2015      Component Value Date/Time   CALCIUM 9.2 08/16/2015 1538   CALCIUM 10.0 06/11/2015 1005   ALKPHOS 117 06/11/2015 1005   ALKPHOS 113 02/14/2015 1439   AST 17 06/11/2015 1005   AST 56* 02/14/2015 1439   ALT <9 06/11/2015 1005   ALT 27 02/14/2015 1439   BILITOT 0.36 06/11/2015 1005   BILITOT 0.4 02/14/2015 1439      ASSESSMENT & PLAN:  Carcinoma of supraglottis He tolerated treatment very well with objective response to treatment, with resolution of the lymphadenopathy. I will  wean off IV fluid support for him for the next 2 weeks. He has appointment to go back to ENT in the near future. He has the tracehostomy removed recently and started to gain weight. He has PET scan scheduled for next month. I will continue  to provide supportive care  S/P gastrostomy  His feeding tube site looks okay without signs of infection. We will remove it after PET results are available      Mucositis due to chemotherapy His pain is well controlled. He felt that the Dilaudid is helpful and will continue the same    Orthostatic hypotension The cause is unknown but could be related to recent treatment He usually responds well to fluid resuscitation. Clinically, he does not look septic. His blood counts including thyroid function tests and cortisol level were unremarkable.  he was started on Florinef by cardiologist with improvement of his blood pressure. I will start weaning him off IV fluid infusion    No orders of the defined types were placed in this encounter.   All questions were answered. The patient knows to call the clinic with any problems, questions or concerns. No barriers to learning was detected. I spent 15 minutes counseling the patient face to face. The total time spent in the appointment was 20 minutes and more than 50% was on counseling and review of test results     [facility], [place], MD 08/17/2015 10:00 AM

## 2015-08-17 NOTE — Telephone Encounter (Signed)
Gave patient avs report and appointments for January. Patient aware of time for 1/12 lab/flush 8:45 am and f/u 11:30 am - IVF's to follow f/u - pet also has scan 1/12. Dates/times per 12/30 pof.

## 2015-08-17 NOTE — Assessment & Plan Note (Signed)
The cause is unknown but could be related to recent treatment He usually responds well to fluid resuscitation. Clinically, he does not look septic. His blood counts including thyroid function tests and cortisol level were unremarkable.  he was started on Florinef by cardiologist with improvement of his blood pressure. I will start weaning him off IV fluid infusion

## 2015-08-17 NOTE — Patient Instructions (Signed)

## 2015-08-17 NOTE — Telephone Encounter (Signed)
LEFT DETAILED MESSAGE ON SECURE VOICEMAIL .  ANY QUESTION MAY CALL BACK

## 2015-08-17 NOTE — Telephone Encounter (Signed)
-----   Message from Skeet Latch, MD sent at 08/17/2015  8:04 AM EST ----- Normal electrolytes.  Continue florinef as ordered.

## 2015-08-17 NOTE — Assessment & Plan Note (Signed)
His feeding tube site looks okay without signs of infection. We will remove it after PET results are available

## 2015-08-24 ENCOUNTER — Non-Acute Institutional Stay (SKILLED_NURSING_FACILITY): Payer: Medicaid Other | Admitting: Nurse Practitioner

## 2015-08-24 DIAGNOSIS — K219 Gastro-esophageal reflux disease without esophagitis: Secondary | ICD-10-CM

## 2015-08-24 DIAGNOSIS — I951 Orthostatic hypotension: Secondary | ICD-10-CM | POA: Diagnosis not present

## 2015-08-24 DIAGNOSIS — F419 Anxiety disorder, unspecified: Secondary | ICD-10-CM | POA: Diagnosis not present

## 2015-08-24 DIAGNOSIS — C321 Malignant neoplasm of supraglottis: Secondary | ICD-10-CM

## 2015-08-24 DIAGNOSIS — R1310 Dysphagia, unspecified: Secondary | ICD-10-CM

## 2015-08-24 NOTE — Progress Notes (Signed)
Patient ID: Hagan Barcelo, male   DOB: 1976-02-11, 40 y.o.   MRN: [id number]    Nursing Home Location:  [place] of Service: SNF (31)  PCP: No PCP Per Patient  No Known Allergies  Chief Complaint  Patient presents with  . Medical Management of Chronic Issues    HPI:  Patient is a 40 y.o. male seen today at [facility] for routine follow up on chronic conditions. Pt with a pmh of hx throat pain, weight loss, ETOH and tobacco abuse. Pt has been doing well in the last month. Pt denies pain at this time. Pt without complaints. getting hydration less frequently from oncology. Eating PO now and plans to remove PEG if conts to do well. Nursing without concerns.  Review of Systems:  Review of Systems  Constitutional: Negative for activity change, appetite change and fatigue.  HENT: Negative for congestion and hearing loss.   Eyes: Negative.   Respiratory: Negative for cough and shortness of breath.   Cardiovascular: Negative for chest pain, palpitations and leg swelling.  Gastrointestinal: Negative for abdominal pain, diarrhea and constipation.  Genitourinary: Negative for dysuria and difficulty urinating.  Musculoskeletal: Negative for myalgias and arthralgias.  Skin: Negative for color change and wound.  Neurological: Negative for dizziness and weakness.  Psychiatric/Behavioral: Negative for behavioral problems, confusion and agitation.    Past Medical History  Diagnosis Date  . ETOH abuse   . Alcohol dependence ([place]) 04/17/2012  . COPD (chronic obstructive pulmonary disease) ([place])   . Shortness of breath dyspnea     occ  . Anxiety   . GERD (gastroesophageal reflux disease)   . Seizures ([place])     one episode 09/2012, suspected related to ETOH withdrawal  . Anxiety disorder 03/12/2015  . Headache 05/10/2015  . Hypokalemia 05/10/2015  . 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 Knee  surgery Left     kneecap -screws  . Appendectomy    . Tracheostomy tube placement N/A 02/22/2015    Procedure: TRACHEOSTOMY;  Surgeon: [name], MD;  Location: [place];  Service: ENT;  Laterality: N/A;  awake tracheostomy  . Direct laryngoscopy N/A 02/22/2015    Procedure: DIRECT LARYNGOSCOPY;  Surgeon: [name], MD;  Location: [place];  Service: ENT;  Laterality: N/A;  direct laryngoscopy with biopsy  . Multiple extractions with alveoloplasty N/A 03/05/2015    Procedure: Extraction of tooth #'s 1,2,8,9,15,16,17,18,31,32 with alveoloplasty, mandibular right lateral exostoses, and gross debridement of remaining teeth;  Surgeon: Lenn Cal, DDS;  Location: [place];  Service: Oral Surgery;  Laterality: N/A;   Social History:   reports that he quit smoking about 7 months ago. His smoking use included Cigarettes. He has a 28 pack-year smoking history. He has never used smokeless tobacco. He reports that he drinks alcohol. He reports that he does not use illicit drugs.  Family History  Problem Relation Age of Onset  . Cancer Mother   . Heart attack Maternal Grandmother     age of death 94  . Heart attack Maternal Grandfather   . Cancer Paternal Grandfather     Medications: Patient's Medications  New Prescriptions   No medications on file  Previous Medications   BISACODYL (DULCOLAX) 10 MG SUPPOSITORY    Place 10 mg rectally as needed for moderate constipation.   CITALOPRAM (CELEXA) 20 MG TABLET    20 mg by PEG Tube route daily.  FENTANYL (DURAGESIC - DOSED MCG/HR) 50 MCG/HR    Apply one patch topically every 72 hours for pain. Remove old patch. External use only.   FLUDROCORTISONE (FLORINEF) 0.1 MG TABLET    Take 2 tablets (0.2 mg total) by mouth daily.   FOLIC ACID (FOLVITE) 1 MG TABLET    Take 1 tablet (1 mg total) by mouth daily.   HYDROMORPHONE (DILAUDID) 4 MG TABLET    Take 1 tablets 30 minutes before meals, up to maximum 6 tablets per day if needed   IBUPROFEN (ADVIL,MOTRIN) 100  MG/5ML SUSPENSION    Take 200-400 mg by mouth every 4 (four) hours as needed for mild pain.    LIDOCAINE (XYLOCAINE) 2 % SOLUTION    Mix 1 part 2%viscous lidocaine,1part H2O.Swish and/or swallow 87ml of this mixture,36min before meals and at bedtime, up to QID   LIDOCAINE-PRILOCAINE (EMLA) CREAM    Apply 1 application topically as needed (FOR PORT ACCESS).   LORAZEPAM (ATIVAN) 1 MG TABLET    Take 0.5 mg by mouth 3 (three) times daily.    MAGNESIUM HYDROXIDE (MILK OF MAGNESIA) 400 MG/5ML SUSPENSION    Take 30 mLs by mouth daily as needed for mild constipation.   MORPHINE (ROXANOL) 20 MG/ML CONCENTRATED SOLUTION    Take 1 mL (20 mg total) by mouth every 2 (two) hours as needed for moderate pain or severe pain.   MULTIPLE VITAMIN (MULTIVITAMIN WITH MINERALS) TABS TABLET    Take 1 tablet by mouth daily.   OMEPRAZOLE (PRILOSEC) 20 MG CAPSULE    20 mg by PEG Tube route daily.   ONDANSETRON (ZOFRAN) 8 MG TABLET    8 mg by PEG Tube route every 8 (eight) hours as needed for nausea or vomiting. Reported on 08/17/2015   PRAVASTATIN (PRAVACHOL) 20 MG TABLET    20 mg by PEG Tube route daily.   PROCHLORPERAZINE (COMPAZINE) 10 MG TABLET    10 mg by PEG Tube route every 6 (six) hours as needed for nausea or vomiting. Reported on 08/17/2015   SODIUM FLUORIDE (PREVIDENT 5000 PLUS) 1.1 % CREA DENTAL CREAM    Apply thin ribbon of cream to tooth brush. Brush teeth for 2 minutes. Spit out excess-DO NOT swallow. DO NOT rinse afterwards. Repeat nightly.   SODIUM PHOSPHATE (FLEET) ENEMA    Place 1 enema rectally once as needed (for constipation). follow package directions   SUCRALFATE (CARAFATE) 1 G TABLET    Dissolve 1 tablet in 41ml H2O and swallow up to QID,PRN sore throat.   THIAMINE 100 MG TABLET    Take 1 tablet (100 mg total) by mouth daily.  Modified Medications   No medications on file  Discontinued Medications   No medications on file     Physical Exam: Filed Vitals:   08/24/15 1511  BP: 132/79  Pulse: 82   Temp: 97.5 F (36.4 C)  Resp: 20  Weight: 138 lb (62.596 kg)    Physical Exam  Constitutional: He is oriented to person, place, and time. No distress.  Thin male, NAD  HENT:  Head: Normocephalic and atraumatic.  Mouth/Throat: Oropharynx is clear and moist. No oropharyngeal exudate.  Eyes: Conjunctivae and EOM are normal. Pupils are equal, round, and reactive to light.  Neck: Normal range of motion. Neck supple.  Cardiovascular: Normal rate, regular rhythm and normal heart sounds.   Pulmonary/Chest: Effort normal and breath sounds normal.  Abdominal: Soft. Bowel sounds are normal.  PEG  Musculoskeletal: He exhibits no edema or tenderness.  Neurological:  He is alert and oriented to person, place, and time.  Skin: Skin is warm and dry. He is not diaphoretic.  Psychiatric: He has a normal mood and affect.    Labs reviewed: Basic Metabolic Panel:  Recent Labs  01/21/15 2033 02/14/15 1439  06/04/15 1350 06/08/15 1313 06/11/15 1005 07/19/15 08/16/15 1538  NA 133* 135  < > 137  --  138 140 142  K 3.6 4.2  < > 4.2  --  4.2 4.2 4.1  CL 94* 100*  --   --   --   --   --  104  CO2  --  24  < > 27  --  27  --  28  GLUCOSE 90 81  < > 109  --  108  --  87  BUN <3* <5*  < > 15.5  --  12.9 10 13   CREATININE 1.20 0.46*  < > 0.8  --  0.8 0.6 0.65  CALCIUM  --  8.5*  < > 9.6  --  10.0  --  9.2  MG  --   --   < > 1.7 1.6 1.8  --   --   < > = values in this interval not displayed. Liver Function Tests:  Recent Labs  05/30/15 0804 06/04/15 1350 06/11/15 1005  AST 19 16 17   ALT <9 9 <9  ALKPHOS 145 128 117  BILITOT 0.46 0.33 0.36  PROT 7.4 7.3 7.4  ALBUMIN 3.4* 3.4* 3.6   No results for input(s): LIPASE, AMYLASE in the last 8760 hours. No results for input(s): AMMONIA in the last 8760 hours. CBC:  Recent Labs  05/30/15 0805 06/04/15 1350 06/11/15 1005  WBC 4.2 3.7* 6.1  NEUTROABS 2.9 2.1 4.3  HGB 10.9* 10.4* 11.0*  HCT 32.7* 30.2* 32.3*  MCV 91.6 90.4 92.2  PLT 306  276 274   TSH:  Recent Labs  03/01/15 0341 05/30/15 0805 06/04/15 1350  TSH 0.617 1.319 0.529   A1C: No results found for: HGBA1C Lipid Panel:  Recent Labs  07/19/15  CHOL 105  HDL 34*  LDLCALC 56  TRIG 75     Assessment/Plan 1. Orthostatic hypotension Improved cont on florinef 0.2 mg daily   2. 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   3. Gastroesophageal reflux disease without esophagitis Stable on omeprazole   4. Anxiety disorder, unspecified anxiety disorder type Pt reports mood has been good. No increase in anxiety or depression noted.   5. Dysphagia Stable, pt eating well, nutrition and all pills PO at this time.     Carlos American. Harle Battiest  [facility] [phone] 8 am - 5 pm) [phone] (after hours)

## 2015-08-24 NOTE — Progress Notes (Signed)
Error

## 2015-08-29 ENCOUNTER — Other Ambulatory Visit: Payer: Self-pay | Admitting: Hematology and Oncology

## 2015-08-29 DIAGNOSIS — C321 Malignant neoplasm of supraglottis: Secondary | ICD-10-CM

## 2015-08-30 ENCOUNTER — Telehealth: Payer: Self-pay | Admitting: *Deleted

## 2015-08-30 ENCOUNTER — Ambulatory Visit (HOSPITAL_COMMUNITY): Payer: Medicaid Other

## 2015-08-30 ENCOUNTER — Ambulatory Visit: Payer: Medicaid Other

## 2015-08-30 ENCOUNTER — Ambulatory Visit (HOSPITAL_BASED_OUTPATIENT_CLINIC_OR_DEPARTMENT_OTHER): Payer: Medicaid Other | Admitting: Hematology and Oncology

## 2015-08-30 ENCOUNTER — Ambulatory Visit (HOSPITAL_BASED_OUTPATIENT_CLINIC_OR_DEPARTMENT_OTHER): Payer: Medicaid Other

## 2015-08-30 ENCOUNTER — Other Ambulatory Visit (HOSPITAL_BASED_OUTPATIENT_CLINIC_OR_DEPARTMENT_OTHER): Payer: Medicaid Other

## 2015-08-30 VITALS — BP 102/69 | HR 90 | Temp 98.1°F | Resp 18 | Wt 140.0 lb

## 2015-08-30 DIAGNOSIS — C321 Malignant neoplasm of supraglottis: Secondary | ICD-10-CM

## 2015-08-30 DIAGNOSIS — Z452 Encounter for adjustment and management of vascular access device: Secondary | ICD-10-CM | POA: Diagnosis present

## 2015-08-30 DIAGNOSIS — D61810 Antineoplastic chemotherapy induced pancytopenia: Secondary | ICD-10-CM | POA: Diagnosis not present

## 2015-08-30 DIAGNOSIS — E876 Hypokalemia: Secondary | ICD-10-CM

## 2015-08-30 DIAGNOSIS — Z95828 Presence of other vascular implants and grafts: Secondary | ICD-10-CM

## 2015-08-30 DIAGNOSIS — T451X5A Adverse effect of antineoplastic and immunosuppressive drugs, initial encounter: Secondary | ICD-10-CM

## 2015-08-30 LAB — COMPREHENSIVE METABOLIC PANEL
ALBUMIN: 3.4 g/dL — AB (ref 3.5–5.0)
ALK PHOS: 107 U/L (ref 40–150)
ALT: <9 U/L (ref 0–55)
ANION GAP: 9 meq/L (ref 3–11)
AST: 17 U/L (ref 5–34)
BUN: 5.1 mg/dL — AB (ref 7.0–26.0)
CALCIUM: 9 mg/dL (ref 8.4–10.4)
CO2: 30 mEq/L — ABNORMAL HIGH (ref 22–29)
Chloride: 104 mEq/L (ref 98–109)
Creatinine: 0.7 mg/dL (ref 0.7–1.3)
Glucose: 87 mg/dl (ref 70–140)
POTASSIUM: 4 meq/L (ref 3.5–5.1)
Sodium: 142 mEq/L (ref 136–145)
Total Bilirubin: 0.56 mg/dL (ref 0.20–1.20)
Total Protein: 6.5 g/dL (ref 6.4–8.3)

## 2015-08-30 LAB — MAGNESIUM: MAGNESIUM: 1.9 mg/dL (ref 1.5–2.5)

## 2015-08-30 LAB — CBC WITH DIFFERENTIAL/PLATELET
BASO%: 0.6 % (ref 0.0–2.0)
BASOS ABS: 0 10*3/uL (ref 0.0–0.1)
EOS ABS: 0.1 10*3/uL (ref 0.0–0.5)
EOS%: 2.8 % (ref 0.0–7.0)
HEMATOCRIT: 34.2 % — AB (ref 38.4–49.9)
HEMOGLOBIN: 11.3 g/dL — AB (ref 13.0–17.1)
LYMPH#: 1.1 10*3/uL (ref 0.9–3.3)
LYMPH%: 33.1 % (ref 14.0–49.0)
MCH: 31.7 pg (ref 27.2–33.4)
MCHC: 32.9 g/dL (ref 32.0–36.0)
MCV: 96.2 fL (ref 79.3–98.0)
MONO#: 0.5 10*3/uL (ref 0.1–0.9)
MONO%: 14.6 % — ABNORMAL HIGH (ref 0.0–14.0)
NEUT#: 1.6 10*3/uL (ref 1.5–6.5)
NEUT%: 48.9 % (ref 39.0–75.0)
PLATELETS: 211 10*3/uL (ref 140–400)
RBC: 3.56 10*6/uL — ABNORMAL LOW (ref 4.20–5.82)
RDW: 13.3 % (ref 11.0–14.6)
WBC: 3.2 10*3/uL — ABNORMAL LOW (ref 4.0–10.3)

## 2015-08-30 MED ORDER — SODIUM CHLORIDE 0.9 % IJ SOLN
10.0000 mL | INTRAMUSCULAR | Status: DC | PRN
  Administered 2015-08-30: 10 mL via INTRAVENOUS
  Filled 2015-08-30: qty 10

## 2015-08-30 NOTE — Telephone Encounter (Signed)
Patient returned Romie Jumper  Phone message, gave him her message,His Pet scan is for 09/06/15 arrive [facility] Radiology dept at [place], and follow up appt with Dr. Isidore Moos on 09/07/15 at 1040am, asked patient to repeat   Message and he stated he wrote it down and did repeat, also asked patient if he still has his port assessed, he stated yes, will try to come in tomorrow and get  Deacessed, 4:33 PM

## 2015-08-30 NOTE — Telephone Encounter (Signed)
CALLED PATIENT TO INFORM OF PET BEING RESCHEDULED FOR 09-06-15 - ARRIVAL TIME - 7 AM @ [facility] AND HIS FU BEING MOVED TO 09-07-15 @ 10:40 AM WITH DR. Isidore Moos, LVM FOR A RETURN CALL

## 2015-08-30 NOTE — Patient Instructions (Signed)

## 2015-08-30 NOTE — Telephone Encounter (Signed)
I believe pt may have left clinic this morning with his PAC still accessed.  I looked for pt in lobby shortly after he left office and was unable to find him.  Dr. Alvy Bimler canceled his IVF appt and let pt go after office visit.  I called his transportation number and spoke w/ lady who said pt had not called her yet to be transported home.  I asked her to make sure pt knows to have his needle d/c'd before he leaves. She said she would call him and make sure he knew before she took him home. I had not seen pt back in clinic so I called [facility] and s/w nurse, Ria Comment.  Informed her that pt's PAC may still be accessed and would need to be flushed and de-accessed.  She said pt came back to facility briefly but then left again.  She is not sure where he is.  Informed her he can come back today before 4:45 pm or come back tomorrow to flush and de-access if needed. She verbalized understanding.

## 2015-08-31 ENCOUNTER — Ambulatory Visit
Admission: RE | Admit: 2015-08-31 | Discharge: 2015-08-31 | Disposition: A | Discharge status: Home / Self Care | Payer: Medicaid Other | Source: Ambulatory Visit | Attending: Radiation Oncology | Admitting: Radiation Oncology

## 2015-08-31 ENCOUNTER — Other Ambulatory Visit: Payer: Self-pay | Admitting: *Deleted

## 2015-08-31 ENCOUNTER — Ambulatory Visit (HOSPITAL_BASED_OUTPATIENT_CLINIC_OR_DEPARTMENT_OTHER): Payer: Medicaid Other

## 2015-08-31 DIAGNOSIS — Z452 Encounter for adjustment and management of vascular access device: Secondary | ICD-10-CM

## 2015-08-31 DIAGNOSIS — C321 Malignant neoplasm of supraglottis: Secondary | ICD-10-CM

## 2015-08-31 MED ORDER — SODIUM CHLORIDE 0.9 % IJ SOLN
10.0000 mL | Freq: Once | INTRAMUSCULAR | Status: AC
  Administered 2015-08-31: 10 mL via INTRAVENOUS
  Filled 2015-08-31: qty 10

## 2015-08-31 MED ORDER — HEPARIN SOD (PORK) LOCK FLUSH 100 UNIT/ML IV SOLN
500.0000 [IU] | Freq: Once | INTRAVENOUS | Status: AC
Start: 2015-08-31 — End: 2015-08-31
  Administered 2015-08-31: 500 [IU] via INTRAVENOUS
  Filled 2015-08-31: qty 5

## 2015-08-31 NOTE — Progress Notes (Signed)
[facility] OFFICE PROGRESS NOTE  Patient Care Team: No Pcp Per Patient as PCP - General (General Practice) [name], MD as Consulting Physician (Otolaryngology) [name], MD as Attending Physician (Radiation Oncology) [name], MD as Consulting Physician (Hematology and Oncology) Leota Sauers, RN as Oncology Nurse Navigator Lenn Cal, DDS as Consulting Physician (Dentistry)  SUMMARY OF ONCOLOGIC HISTORY: Oncology History   Carcinoma of supraglottis   Staging form: Larynx - Supraglottis, AJCC 7th Edition     Clinical stage from 03/13/2015: Stage IVA (T3, N2b, M0) - Signed by [name], MD on 03/13/2015     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[place])   01/22/2015 Imaging CT scan showed enhancing infiltrative mass centered at the right piriform sinus with bilateral neck lymphadenopathy   02/22/2015 - 03/08/2015 Hospital Admission He was admitted to the hospital for management of advanced supraglottic cancer status post tracheostomy, biopsy, dental extraction, placement of feeding tube and subsequent discharge to skilled nursing facility   02/22/2015 Pathology Results Accession: [id number] 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   03/09/2015 PET scan 1)  Right piriform sinus mass with right level 2 nodal metastasis.  2)  Hypermetabolic thoracic nodes which are at least partially felt to be reactive.  3)  No evidence of subdiaphragmatic metastatic disease.    03/16/2015 Procedure He has placement of port   03/21/2015 - 05/04/2015 Chemotherapy He received high dose cisplatin x 3 cycles   03/21/2015 - 05/09/2015 Radiation Therapy Tomotherapy to supraglottic tumor and bilateral neck:  70 Gy in 35 fractions to gross disease, 63 Gy in 35 fractions to high risk nodal echelons, and 56 Gy in 35 fractions to intermediate risk nodal echelons.   05/09/2015 Miscellaneous He went  to the ED with fall    INTERVAL HISTORY: Please see below for problem oriented charting. He returns for further follow-up. He felt well. Denies nausea vomiting and dizziness. He is eating adequately and has not lost any weight.  REVIEW OF SYSTEMS:   Constitutional: Denies fevers, chills or abnormal weight loss Eyes: Denies blurriness of vision Ears, nose, mouth, throat, and face: Denies mucositis or sore throat Respiratory: Denies cough, dyspnea or wheezes Cardiovascular: Denies palpitation, chest discomfort or lower extremity swelling Gastrointestinal:  Denies nausea, heartburn or change in bowel habits Skin: Denies abnormal skin rashes Lymphatics: Denies new lymphadenopathy or easy bruising Neurological:Denies numbness, tingling or new weaknesses Behavioral/Psych: Mood is stable, no new changes  All other systems were reviewed with the patient and are negative.  I have reviewed the past medical history, past surgical history, social history and family history with the patient and they are unchanged from previous note.  ALLERGIES:  has No Known Allergies.  MEDICATIONS:  Current Outpatient Prescriptions  Medication Sig Dispense Refill  . bisacodyl (DULCOLAX) 10 MG suppository Place 10 mg rectally as needed for moderate constipation.    . citalopram (CELEXA) 20 MG tablet 20 mg by PEG Tube route daily.    . fentaNYL (DURAGESIC - DOSED MCG/HR) 50 MCG/HR Apply one patch topically every 72 hours for pain. Remove old patch. External use only. 10 patch 0  . fludrocortisone (FLORINEF) 0.1 MG tablet Take 2 tablets (0.2 mg total) by mouth daily. 60 tablet 11  . folic acid (FOLVITE) 1 MG tablet Take 1 tablet (1 mg total) by mouth daily. 30 tablet 0  . HYDROmorphone (DILAUDID) 4 MG tablet Take 1 tablets  30 minutes before meals, up to maximum 6 tablets per day if needed 60 tablet 0  . ibuprofen (ADVIL,MOTRIN) 100 MG/5ML suspension Take 200-400 mg by mouth every 4 (four) hours as needed for  mild pain.     Marland Kitchen lidocaine (XYLOCAINE) 2 % solution Mix 1 part 2%viscous lidocaine,1part H2O.Swish and/or swallow 18ml of this mixture,33min before meals and at bedtime, up to QID (Patient taking differently: Mix 1 part 2%viscous lidocaine,1part H2O.Swish and/or swallow 70ml of this mixture,55min before meals and at bedtime, up to QID as needed for sore throat) 100 mL 5  . lidocaine-prilocaine (EMLA) cream Apply 1 application topically as needed (FOR PORT ACCESS).    . LORazepam (ATIVAN) 1 MG tablet Take 0.5 mg by mouth 3 (three) times daily.     . magnesium hydroxide (MILK OF MAGNESIA) 400 MG/5ML suspension Take 30 mLs by mouth daily as needed for mild constipation.    Marland Kitchen morphine (ROXANOL) 20 MG/ML concentrated solution Take 1 mL (20 mg total) by mouth every 2 (two) hours as needed for moderate pain or severe pain. 240 mL 0  . Multiple Vitamin (MULTIVITAMIN WITH MINERALS) TABS tablet Take 1 tablet by mouth daily. 30 tablet 0  . omeprazole (PRILOSEC) 20 MG capsule 20 mg by PEG Tube route daily.    . ondansetron (ZOFRAN) 8 MG tablet 8 mg by PEG Tube route every 8 (eight) hours as needed for nausea or vomiting. Reported on 08/17/2015    . pravastatin (PRAVACHOL) 20 MG tablet 20 mg by PEG Tube route daily.    . prochlorperazine (COMPAZINE) 10 MG tablet 10 mg by PEG Tube route every 6 (six) hours as needed for nausea or vomiting. Reported on 08/17/2015    . sodium fluoride (PREVIDENT 5000 PLUS) 1.1 % CREA dental cream Apply thin ribbon of cream to tooth brush. Brush teeth for 2 minutes. Spit out excess-DO NOT swallow. DO NOT rinse afterwards. Repeat nightly. 1 Tube prn  . sodium phosphate (FLEET) enema Place 1 enema rectally once as needed (for constipation). follow package directions    . sucralfate (CARAFATE) 1 G tablet Dissolve 1 tablet in 97ml H2O and swallow up to QID,PRN sore throat. 60 tablet 5  . thiamine 100 MG tablet Take 1 tablet (100 mg total) by mouth daily. 30 tablet 0   No current  facility-administered medications for this visit.   Facility-Administered Medications Ordered in Other Visits  Medication Dose Route Frequency Provider Last Rate Last Dose  . 0.9 %  sodium chloride infusion   Intravenous Once [name], MD      . HYDROmorphone (DILAUDID) injection 2 mg  2 mg Intravenous Q2H PRN Jasmine Maceachern, MD      . sodium chloride 0.9 % injection 10 mL  10 mL Intracatheter PRN [name], MD      . sodium chloride 0.9 % injection 10 mL  10 mL Intracatheter PRN [name], MD        PHYSICAL EXAMINATION: ECOG PERFORMANCE STATUS: 0 - Asymptomatic  Filed Vitals:   08/30/15 0903  BP: 102/69  Pulse: 90  Temp: 98.1 F (36.7 C)  Resp: 18   Filed Weights   08/30/15 0903  Weight: 140 lb (63.504 kg)    GENERAL:alert, no distress and comfortable SKIN: skin color, texture, turgor are normal, no rashes or significant lesions EYES: normal, Conjunctiva are pink and non-injected, sclera clear OROPHARYNX:no exudate, no erythema and lips, buccal mucosa, and tongue normal  NECK: supple, thyroid normal size, non-tender, without nodularity LYMPH:  no palpable lymphadenopathy in the cervical, axillary or inguinal LUNGS: clear to auscultation and percussion with normal breathing effort HEART: regular rate & rhythm and no murmurs and no lower extremity edema ABDOMEN:abdomen soft, non-tender and normal bowel sounds Musculoskeletal:no cyanosis of digits and no clubbing  NEURO: alert & oriented x 3 with fluent speech, no focal motor/sensory deficits  LABORATORY DATA:  I have reviewed the data as listed    Component Value Date/Time   NA 142 08/30/2015 0836   NA 142 08/16/2015 1538   NA 140 07/19/2015   K 4.0 08/30/2015 0836   K 4.1 08/16/2015 1538   CL 104 08/16/2015 1538   CO2 30* 08/30/2015 0836   CO2 28 08/16/2015 1538   GLUCOSE 87 08/30/2015 0836   GLUCOSE 87 08/16/2015 1538   BUN 5.1* 08/30/2015 0836   BUN 13 08/16/2015 1538   BUN 10 07/19/2015   CREATININE 0.7  08/30/2015 0836   CREATININE 0.65 08/16/2015 1538   CREATININE 0.6 07/19/2015   CREATININE 0.65 02/22/2015 1731   CALCIUM 9.0 08/30/2015 0836   CALCIUM 9.2 08/16/2015 1538   PROT 6.5 08/30/2015 0836   PROT 7.7 02/14/2015 1439   ALBUMIN 3.4* 08/30/2015 0836   ALBUMIN 3.3* 02/14/2015 1439   AST 17 08/30/2015 0836   AST 56* 02/14/2015 1439   ALT <9 08/30/2015 0836   ALT 27 02/14/2015 1439   ALKPHOS 107 08/30/2015 0836   ALKPHOS 113 02/14/2015 1439   BILITOT 0.56 08/30/2015 0836   BILITOT 0.4 02/14/2015 1439   GFRNONAA >60 02/22/2015 1731   GFRAA >60 02/22/2015 1731    No results found for: SPEP, UPEP  Lab Results  Component Value Date   WBC 3.2* 08/30/2015   NEUTROABS 1.6 08/30/2015   HGB 11.3* 08/30/2015   HCT 34.2* 08/30/2015   MCV 96.2 08/30/2015   PLT 211 08/30/2015      Chemistry      Component Value Date/Time   NA 142 08/30/2015 0836   NA 142 08/16/2015 1538   NA 140 07/19/2015   K 4.0 08/30/2015 0836   K 4.1 08/16/2015 1538   CL 104 08/16/2015 1538   CO2 30* 08/30/2015 0836   CO2 28 08/16/2015 1538   BUN 5.1* 08/30/2015 0836   BUN 13 08/16/2015 1538   BUN 10 07/19/2015   CREATININE 0.7 08/30/2015 0836   CREATININE 0.65 08/16/2015 1538   CREATININE 0.6 07/19/2015   CREATININE 0.65 02/22/2015 1731   GLU 82 07/19/2015      Component Value Date/Time   CALCIUM 9.0 08/30/2015 0836   CALCIUM 9.2 08/16/2015 1538   ALKPHOS 107 08/30/2015 0836   ALKPHOS 113 02/14/2015 1439   AST 17 08/30/2015 0836   AST 56* 02/14/2015 1439   ALT <9 08/30/2015 0836   ALT 27 02/14/2015 1439   BILITOT 0.56 08/30/2015 0836   BILITOT 0.4 02/14/2015 1439      ASSESSMENT & PLAN:  Carcinoma of supraglottis He tolerated treatment very well with objective response to treatment, with resolution of the lymphadenopathy. The patient is able to eat adequately by mouth and felt hydrated. I will cancel his IV fluids. He has appointment to go back to ENT in the near future. For some  reason, his PET CT scan was denied. We will get that arranged and rescheduled. If PET CT scan showed complete response to treatment, I will get the feeding tube and port removed.   Pancytopenia due to antineoplastic chemotherapy [facility]) This is likely due to recent treatment.  The patient denies recent history of bleeding such as epistaxis, hematuria or hematochezia. He is asymptomatic from the anemia. I will observe for now.        All questions were answered. The patient knows to call the clinic with any problems, questions or concerns. No barriers to learning was detected. I spent 15 minutes counseling the patient face to face. The total time spent in the appointment was 20 minutes and more than 50% was on counseling and review of test results     [facility], [place], MD 08/31/2015 7:42 AM

## 2015-08-31 NOTE — Assessment & Plan Note (Signed)
He tolerated treatment very well with objective response to treatment, with resolution of the lymphadenopathy. The patient is able to eat adequately by mouth and felt hydrated. I will cancel his IV fluids. He has appointment to go back to ENT in the near future. For some reason, his PET CT scan was denied. We will get that arranged and rescheduled. If PET CT scan showed complete response to treatment, I will get the feeding tube and port removed.

## 2015-08-31 NOTE — Assessment & Plan Note (Signed)
This is likely due to recent treatment. The patient denies recent history of bleeding such as epistaxis, hematuria or hematochezia. He is asymptomatic from the anemia. I will observe for now.    

## 2015-09-05 ENCOUNTER — Encounter (HOSPITAL_COMMUNITY): Payer: Self-pay | Admitting: Emergency Medicine

## 2015-09-05 ENCOUNTER — Inpatient Hospital Stay (HOSPITAL_COMMUNITY)
Admission: EM | Admit: 2015-09-05 | Discharge: 2015-09-08 | DRG: 312 | Disposition: A | Discharge status: Skilled Nursing Facility | Payer: Medicaid Other | Attending: Internal Medicine | Admitting: Internal Medicine

## 2015-09-05 DIAGNOSIS — Z8249 Family history of ischemic heart disease and other diseases of the circulatory system: Secondary | ICD-10-CM

## 2015-09-05 DIAGNOSIS — R569 Unspecified convulsions: Secondary | ICD-10-CM | POA: Diagnosis present

## 2015-09-05 DIAGNOSIS — F419 Anxiety disorder, unspecified: Secondary | ICD-10-CM | POA: Diagnosis present

## 2015-09-05 DIAGNOSIS — Y908 Blood alcohol level of 240 mg/100 ml or more: Secondary | ICD-10-CM | POA: Diagnosis present

## 2015-09-05 DIAGNOSIS — I959 Hypotension, unspecified: Secondary | ICD-10-CM

## 2015-09-05 DIAGNOSIS — R1310 Dysphagia, unspecified: Secondary | ICD-10-CM | POA: Diagnosis present

## 2015-09-05 DIAGNOSIS — Z681 Body mass index (BMI) 19 or less, adult: Secondary | ICD-10-CM

## 2015-09-05 DIAGNOSIS — F329 Major depressive disorder, single episode, unspecified: Secondary | ICD-10-CM | POA: Diagnosis present

## 2015-09-05 DIAGNOSIS — I952 Hypotension due to drugs: Secondary | ICD-10-CM | POA: Diagnosis present

## 2015-09-05 DIAGNOSIS — K219 Gastro-esophageal reflux disease without esophagitis: Secondary | ICD-10-CM | POA: Diagnosis present

## 2015-09-05 DIAGNOSIS — C7800 Secondary malignant neoplasm of unspecified lung: Secondary | ICD-10-CM | POA: Insufficient documentation

## 2015-09-05 DIAGNOSIS — E43 Unspecified severe protein-calorie malnutrition: Secondary | ICD-10-CM | POA: Diagnosis present

## 2015-09-05 DIAGNOSIS — J449 Chronic obstructive pulmonary disease, unspecified: Secondary | ICD-10-CM | POA: Diagnosis present

## 2015-09-05 DIAGNOSIS — E440 Moderate protein-calorie malnutrition: Secondary | ICD-10-CM | POA: Diagnosis present

## 2015-09-05 DIAGNOSIS — T730XXA Starvation, initial encounter: Secondary | ICD-10-CM | POA: Diagnosis present

## 2015-09-05 DIAGNOSIS — C321 Malignant neoplasm of supraglottis: Secondary | ICD-10-CM | POA: Diagnosis present

## 2015-09-05 DIAGNOSIS — Z809 Family history of malignant neoplasm, unspecified: Secondary | ICD-10-CM

## 2015-09-05 DIAGNOSIS — E785 Hyperlipidemia, unspecified: Secondary | ICD-10-CM | POA: Diagnosis present

## 2015-09-05 DIAGNOSIS — D6481 Anemia due to antineoplastic chemotherapy: Secondary | ICD-10-CM | POA: Diagnosis present

## 2015-09-05 DIAGNOSIS — R0602 Shortness of breath: Secondary | ICD-10-CM

## 2015-09-05 DIAGNOSIS — K1230 Oral mucositis (ulcerative), unspecified: Secondary | ICD-10-CM | POA: Diagnosis present

## 2015-09-05 DIAGNOSIS — Z79899 Other long term (current) drug therapy: Secondary | ICD-10-CM

## 2015-09-05 DIAGNOSIS — R1084 Generalized abdominal pain: Secondary | ICD-10-CM | POA: Diagnosis present

## 2015-09-05 DIAGNOSIS — Z87891 Personal history of nicotine dependence: Secondary | ICD-10-CM

## 2015-09-05 DIAGNOSIS — E860 Dehydration: Secondary | ICD-10-CM | POA: Diagnosis present

## 2015-09-05 DIAGNOSIS — F1020 Alcohol dependence, uncomplicated: Secondary | ICD-10-CM | POA: Diagnosis present

## 2015-09-05 DIAGNOSIS — I951 Orthostatic hypotension: Principal | ICD-10-CM | POA: Diagnosis present

## 2015-09-05 DIAGNOSIS — Z931 Gastrostomy status: Secondary | ICD-10-CM

## 2015-09-05 DIAGNOSIS — T451X5A Adverse effect of antineoplastic and immunosuppressive drugs, initial encounter: Secondary | ICD-10-CM | POA: Diagnosis present

## 2015-09-05 HISTORY — DX: Malignant (primary) neoplasm, unspecified: C80.1

## 2015-09-05 HISTORY — DX: Malignant neoplasm of pharynx, unspecified: C14.0

## 2015-09-05 LAB — RAPID URINE DRUG SCREEN, HOSP PERFORMED
Amphetamines: NOT DETECTED
BENZODIAZEPINES: NOT DETECTED
Barbiturates: NOT DETECTED
COCAINE: NOT DETECTED
Opiates: NOT DETECTED
Tetrahydrocannabinol: NOT DETECTED

## 2015-09-05 LAB — I-STAT CG4 LACTIC ACID, ED: LACTIC ACID, VENOUS: 3.42 mmol/L — AB (ref 0.5–2.0)

## 2015-09-05 LAB — COMPREHENSIVE METABOLIC PANEL
ALK PHOS: 127 U/L — AB (ref 38–126)
ALT: 11 U/L — ABNORMAL LOW (ref 17–63)
ANION GAP: 14 (ref 5–15)
AST: 29 U/L (ref 15–41)
Albumin: 3.8 g/dL (ref 3.5–5.0)
BILIRUBIN TOTAL: 0.5 mg/dL (ref 0.3–1.2)
BUN: <5 mg/dL — ABNORMAL LOW (ref 6–20)
CALCIUM: 9 mg/dL (ref 8.9–10.3)
CO2: 26 mmol/L (ref 22–32)
Chloride: 101 mmol/L (ref 101–111)
Creatinine, Ser: 0.76 mg/dL (ref 0.61–1.24)
GFR calc non Af Amer: >60 mL/min (ref >60)
Glucose, Bld: 81 mg/dL (ref 65–99)
Potassium: 3.9 mmol/L (ref 3.5–5.1)
Sodium: 141 mmol/L (ref 135–145)
TOTAL PROTEIN: 7.1 g/dL (ref 6.5–8.1)

## 2015-09-05 LAB — CBC WITH DIFFERENTIAL/PLATELET
Basophils Absolute: 0 10*3/uL (ref 0.0–0.1)
Basophils Relative: 0 %
EOS PCT: 1 %
Eosinophils Absolute: 0 10*3/uL (ref 0.0–0.7)
HCT: 38.2 % — ABNORMAL LOW (ref 39.0–52.0)
Hemoglobin: 12.7 g/dL — ABNORMAL LOW (ref 13.0–17.0)
LYMPHS ABS: 1.6 10*3/uL (ref 0.7–4.0)
LYMPHS PCT: 44 %
MCH: 32.4 pg (ref 26.0–34.0)
MCHC: 33.2 g/dL (ref 30.0–36.0)
MCV: 97.4 fL (ref 78.0–100.0)
MONO ABS: 0.3 10*3/uL (ref 0.1–1.0)
Monocytes Relative: 9 %
Neutro Abs: 1.6 10*3/uL — ABNORMAL LOW (ref 1.7–7.7)
Neutrophils Relative %: 46 %
PLATELETS: 203 10*3/uL (ref 150–400)
RBC: 3.92 MIL/uL — AB (ref 4.22–5.81)
RDW: 15.2 % (ref 11.5–15.5)
WBC: 3.5 10*3/uL — AB (ref 4.0–10.5)

## 2015-09-05 LAB — URINALYSIS, ROUTINE W REFLEX MICROSCOPIC
Bilirubin Urine: NEGATIVE
Glucose, UA: NEGATIVE mg/dL
Hgb urine dipstick: NEGATIVE
Ketones, ur: NEGATIVE mg/dL
Leukocytes, UA: NEGATIVE
Nitrite: NEGATIVE
PH: 6.5 (ref 5.0–8.0)
PROTEIN: NEGATIVE mg/dL
SPECIFIC GRAVITY, URINE: 1.005 (ref 1.005–1.030)

## 2015-09-05 MED ORDER — SODIUM CHLORIDE 0.9 % IV BOLUS (SEPSIS)
1000.0000 mL | Freq: Once | INTRAVENOUS | Status: AC
  Administered 2015-09-05: 1000 mL via INTRAVENOUS

## 2015-09-05 NOTE — ED Notes (Signed)
Pt recently finished chemo for throat cancer (end of sept) and has been at a SNF since then. Pt left SNF about 4 days ago and has been hypotensive since then. Pt states he had to receive a salt pill and fluids 6 days a week for hypotension.

## 2015-09-05 NOTE — ED Provider Notes (Signed)
CSN: [id number]     Arrival date & time 09/05/15  2054 History   First MD Initiated Contact with Patient 09/05/15 2243     Chief Complaint  Patient presents with  . Loss of Consciousness      Patient is a 40 y.o. male presenting with syncope. The history is provided by the patient.  Loss of Consciousness Associated symptoms: no chest pain and no shortness of breath    patient has a history of throat cancer. Has been in a nursing home for it. He left 4 days ago because he was not getting along with his nursing home. He has a PEG tube. Reportedly has not been able to eat or drink in the last 4 days. However states he has been able to drink liquids and has had some beer. Note from the nurse states his sister said he has been drinking heavily. Patient states he did have a beer. He reportedly passed out today. States he's been having to have IV fluids to keep his blood pressure up. Denies chest pain or trouble breathing. Has some abdominal pain. States he hasn't taken any this medicine either. States he was on Dilaudid and morphine as needed. States he also has a fentanyl patch. He is currently wearing a fentanyl patch. He also is somewhat depressed and has occasional suicidal thoughts.  Past Medical History  Diagnosis Date  . ETOH abuse   . Alcohol dependence ([place]) 04/17/2012  . COPD (chronic obstructive pulmonary disease) ([place])   . Shortness of breath dyspnea     occ  . Anxiety   . GERD (gastroesophageal reflux disease)   . Seizures ([place])     one episode 09/2012, suspected related to ETOH withdrawal  . Anxiety disorder 03/12/2015  . Headache 05/10/2015  . Hypokalemia 05/10/2015  . 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 Cancer ([place])   . Throat cancer [facility])    Past Surgical History  Procedure Laterality Date  . Knee surgery Left     kneecap -screws  . Appendectomy    . Tracheostomy tube placement N/A 02/22/2015    Procedure: TRACHEOSTOMY;  Surgeon: [name], MD;  Location: [place];   Service: ENT;  Laterality: N/A;  awake tracheostomy  . Direct laryngoscopy N/A 02/22/2015    Procedure: DIRECT LARYNGOSCOPY;  Surgeon: [name], MD;  Location: [place];  Service: ENT;  Laterality: N/A;  direct laryngoscopy with biopsy  . Multiple extractions with alveoloplasty N/A 03/05/2015    Procedure: Extraction of tooth #'s 1,2,8,9,15,16,17,18,31,32 with alveoloplasty, mandibular right lateral exostoses, and gross debridement of remaining teeth;  Surgeon: Lenn Cal, DDS;  Location: [place];  Service: Oral Surgery;  Laterality: N/A;   Family History  Problem Relation Age of Onset  . Cancer Mother   . Heart attack Maternal Grandmother     age of death 63  . Heart attack Maternal Grandfather   . Cancer Paternal Grandfather    Social History  Substance Use Topics  . Smoking status: Former Smoker -- 1.00 packs/day for 28 years    Types: Cigarettes    Quit date: 01/17/2015  . Smokeless tobacco: Never Used  . Alcohol Use: 0.0 oz/week    0 Standard drinks or equivalent per week     Comment: 6 -40-oz. beers daily    Review of Systems  Constitutional: Positive for appetite change.  HENT: Negative for facial swelling.   Respiratory: Negative for shortness of breath.   Cardiovascular: Positive for syncope. Negative for chest pain.  Gastrointestinal: Negative for abdominal pain.  Genitourinary: Negative for hematuria.  Skin: Negative for wound.  Neurological: Positive for syncope and light-headedness.  Psychiatric/Behavioral: Positive for suicidal ideas and dysphoric mood.      Allergies  Review of patient's allergies indicates no known allergies.  Home Medications   Prior to Admission medications   Medication Sig Start Date End Date Taking? Authorizing Provider  feeding supplement, ENSURE, (ENSURE) PUDG Take 1 Container by mouth 3 (three) times daily between meals.   Yes Historical Provider, MD  potassium chloride SA (K-DUR,KLOR-CON) 20 MEQ tablet Take 20 mEq by mouth 3  (three) times daily.   Yes Historical Provider, MD  bisacodyl (DULCOLAX) 10 MG suppository Place 10 mg rectally as needed for moderate constipation.    Historical Provider, MD  citalopram (CELEXA) 20 MG tablet Take 20 mg by mouth daily.     Historical Provider, MD  fentaNYL (DURAGESIC - DOSED MCG/HR) 50 MCG/HR Apply one patch topically every 72 hours for pain. Remove old patch. External use only. 06/18/15   Tiffany L Reed, DO  fludrocortisone (FLORINEF) 0.1 MG tablet Take 2 tablets (0.2 mg total) by mouth daily. Patient taking differently: Take 0.1 mg by mouth daily.  08/16/15   Skeet Latch, MD  folic acid (FOLVITE) 1 MG tablet Take 1 tablet (1 mg total) by mouth daily. 03/08/15   [name], MD  HYDROmorphone (DILAUDID) 4 MG tablet Take 1 tablets 30 minutes before meals, up to maximum 6 tablets per day if needed 06/29/15   [name], MD  ibuprofen (ADVIL,MOTRIN) 100 MG/5ML suspension Take 200-400 mg by mouth every 4 (four) hours as needed for mild pain.     Historical Provider, MD  lidocaine (XYLOCAINE) 2 % solution Mix 1 part 2%viscous lidocaine,1part H2O.Swish and/or swallow 60ml of this mixture,19min before meals and at bedtime, up to QID Patient taking differently: Mix 1 part 2%viscous lidocaine,1part H2O.Swish and/or swallow 68ml of this mixture,69min before meals and at bedtime, up to QID as needed for sore throat 04/02/15   [name], MD  lidocaine-prilocaine (EMLA) cream Apply 1 application topically as needed (FOR PORT ACCESS).    Historical Provider, MD  LORazepam (ATIVAN) 1 MG tablet Take 0.5 mg by mouth 3 (three) times daily.     Historical Provider, MD  magnesium hydroxide (MILK OF MAGNESIA) 400 MG/5ML suspension Take 30 mLs by mouth daily as needed for mild constipation.    Historical Provider, MD  morphine (ROXANOL) 20 MG/ML concentrated solution Take 1 mL (20 mg total) by mouth every 2 (two) hours as needed for moderate pain or severe pain. 03/26/15   [name], MD  Multiple  Vitamin (MULTIVITAMIN WITH MINERALS) TABS tablet Take 1 tablet by mouth daily. 03/08/15   [name], MD  omeprazole (PRILOSEC) 20 MG capsule Take 20 mg by mouth daily.     Historical Provider, MD  ondansetron (ZOFRAN) 8 MG tablet 8 mg by PEG Tube route every 8 (eight) hours as needed for nausea or vomiting. Reported on 08/17/2015    Historical Provider, MD  pravastatin (PRAVACHOL) 20 MG tablet Take 20 mg by mouth daily.     Historical Provider, MD  prochlorperazine (COMPAZINE) 10 MG tablet 10 mg by PEG Tube route every 6 (six) hours as needed for nausea or vomiting. Reported on 08/17/2015    Historical Provider, MD  sodium fluoride (PREVIDENT 5000 PLUS) 1.1 % CREA dental cream Apply thin ribbon of cream to tooth brush. Brush teeth for 2 minutes. Spit out excess-DO NOT  swallow. DO NOT rinse afterwards. Repeat nightly. 04/03/15   Lenn Cal, DDS  sodium phosphate (FLEET) enema Place 1 enema rectally once as needed (for constipation). follow package directions    Historical Provider, MD  sucralfate (CARAFATE) 1 G tablet Dissolve 1 tablet in 33ml H2O and swallow up to QID,PRN sore throat. 04/02/15   [name], MD  thiamine 100 MG tablet Take 1 tablet (100 mg total) by mouth daily. 03/08/15   [name], MD   BP 94/67 mmHg  Pulse 61  Temp(Src) 97.8 F (36.6 C) (Oral)  Resp 12  Ht 6\' 1"  (1.854 m)  Wt 140 lb (63.504 kg)  BMI 18.47 kg/m2  SpO2 100% Physical Exam  Constitutional: He appears well-developed.  HENT:  Head: Atraumatic.  Neck:  Scars from previous trach  Cardiovascular: Normal rate.   Pulmonary/Chest: Effort normal.  Fentanyl patch on left chest  Abdominal: Soft.  PEG tube in left abdomen  Musculoskeletal: Normal range of motion.  Neurological: He is alert.  Skin: Skin is warm.    ED Course  Procedures (including critical care time) Labs Review Labs Reviewed  COMPREHENSIVE METABOLIC PANEL - Abnormal; Notable for the following:    BUN <5 (*)    ALT 11 (*)     Alkaline Phosphatase 127 (*)    All other components within normal limits  CBC WITH DIFFERENTIAL/PLATELET - Abnormal; Notable for the following:    WBC 3.5 (*)    RBC 3.92 (*)    Hemoglobin 12.7 (*)    HCT 38.2 (*)    Neutro Abs 1.6 (*)    All other components within normal limits  URINALYSIS, ROUTINE W REFLEX MICROSCOPIC (NOT AT [facility]) - Abnormal; Notable for the following:    APPearance CLOUDY (*)    All other components within normal limits  I-STAT CG4 LACTIC ACID, ED - Abnormal; Notable for the following:    Lactic Acid, Venous 3.42 (*)    All other components within normal limits  URINE RAPID DRUG SCREEN, HOSP PERFORMED  TROPONIN I  ETHANOL    Imaging Review No results found. I have personally reviewed and evaluated these images and lab results as part of my medical decision-making.   EKG Interpretation   Date/Time:  Wednesday September 05 2015 23:29:45 EST Ventricular Rate:  65 PR Interval:  120 QRS Duration: 70 QT Interval:  425 QTC Calculation: 442 R Axis:   83 Text Interpretation:  Sinus rhythm ST elevation suggests acute  pericarditis Confirmed by Alvino Chapel  MD, Ovid Curd [phone]) on 09/05/2015  11:45:31 PM      MDM   Final diagnoses:  Hypotension, unspecified hypotension type  Dehydration    Patient with hypotension and likely dehydration. Lactic acid mildly elevated. Left nursing home AMA. Will admit to internal medicine.    Davonna Belling, MD 09/06/15 Laureen Abrahams

## 2015-09-05 NOTE — ED Notes (Signed)
Pt's sister states that pt has been drinking heavily since he has been dc'ed from SNF. Pt states he has had thoughts of hurting himself. He denies a plan, just has passive thoughts

## 2015-09-05 NOTE — ED Notes (Signed)
Nurse drawing labs. 

## 2015-09-05 NOTE — ED Notes (Signed)
MD at bedside.-[place] 

## 2015-09-05 NOTE — ED Notes (Addendum)
Reports "My blood pressure gets really low and I will fall out. I am at [facility] and it has happened about 4 times"  States "Me and my roommate where not getting along so I just left, I wasn't supposed to leave."

## 2015-09-06 ENCOUNTER — Inpatient Hospital Stay (HOSPITAL_COMMUNITY): Payer: Medicaid Other

## 2015-09-06 ENCOUNTER — Encounter (HOSPITAL_COMMUNITY): Admission: RE | Admit: 2015-09-06 | Payer: Medicaid Other | Source: Ambulatory Visit

## 2015-09-06 ENCOUNTER — Encounter (HOSPITAL_COMMUNITY): Payer: Self-pay

## 2015-09-06 ENCOUNTER — Telehealth: Payer: Self-pay | Admitting: *Deleted

## 2015-09-06 DIAGNOSIS — R1084 Generalized abdominal pain: Secondary | ICD-10-CM | POA: Diagnosis present

## 2015-09-06 DIAGNOSIS — F419 Anxiety disorder, unspecified: Secondary | ICD-10-CM | POA: Diagnosis present

## 2015-09-06 DIAGNOSIS — E860 Dehydration: Secondary | ICD-10-CM | POA: Diagnosis present

## 2015-09-06 DIAGNOSIS — Y908 Blood alcohol level of 240 mg/100 ml or more: Secondary | ICD-10-CM | POA: Diagnosis present

## 2015-09-06 DIAGNOSIS — I951 Orthostatic hypotension: Secondary | ICD-10-CM | POA: Diagnosis not present

## 2015-09-06 DIAGNOSIS — F1020 Alcohol dependence, uncomplicated: Secondary | ICD-10-CM | POA: Diagnosis present

## 2015-09-06 DIAGNOSIS — Z8249 Family history of ischemic heart disease and other diseases of the circulatory system: Secondary | ICD-10-CM | POA: Diagnosis not present

## 2015-09-06 DIAGNOSIS — E785 Hyperlipidemia, unspecified: Secondary | ICD-10-CM | POA: Diagnosis present

## 2015-09-06 DIAGNOSIS — D6481 Anemia due to antineoplastic chemotherapy: Secondary | ICD-10-CM | POA: Diagnosis not present

## 2015-09-06 DIAGNOSIS — F1010 Alcohol abuse, uncomplicated: Secondary | ICD-10-CM | POA: Diagnosis not present

## 2015-09-06 DIAGNOSIS — R55 Syncope and collapse: Secondary | ICD-10-CM | POA: Diagnosis present

## 2015-09-06 DIAGNOSIS — R1310 Dysphagia, unspecified: Secondary | ICD-10-CM | POA: Diagnosis present

## 2015-09-06 DIAGNOSIS — Z809 Family history of malignant neoplasm, unspecified: Secondary | ICD-10-CM | POA: Diagnosis not present

## 2015-09-06 DIAGNOSIS — C321 Malignant neoplasm of supraglottis: Secondary | ICD-10-CM | POA: Diagnosis present

## 2015-09-06 DIAGNOSIS — R070 Pain in throat: Secondary | ICD-10-CM | POA: Diagnosis not present

## 2015-09-06 DIAGNOSIS — Z87891 Personal history of nicotine dependence: Secondary | ICD-10-CM | POA: Diagnosis not present

## 2015-09-06 DIAGNOSIS — K1230 Oral mucositis (ulcerative), unspecified: Secondary | ICD-10-CM | POA: Diagnosis present

## 2015-09-06 DIAGNOSIS — Z79899 Other long term (current) drug therapy: Secondary | ICD-10-CM | POA: Diagnosis not present

## 2015-09-06 DIAGNOSIS — E43 Unspecified severe protein-calorie malnutrition: Secondary | ICD-10-CM | POA: Diagnosis present

## 2015-09-06 DIAGNOSIS — T451X5A Adverse effect of antineoplastic and immunosuppressive drugs, initial encounter: Secondary | ICD-10-CM | POA: Diagnosis not present

## 2015-09-06 DIAGNOSIS — C7800 Secondary malignant neoplasm of unspecified lung: Secondary | ICD-10-CM | POA: Diagnosis not present

## 2015-09-06 DIAGNOSIS — I952 Hypotension due to drugs: Secondary | ICD-10-CM | POA: Diagnosis present

## 2015-09-06 DIAGNOSIS — Z931 Gastrostomy status: Secondary | ICD-10-CM | POA: Diagnosis not present

## 2015-09-06 DIAGNOSIS — D61818 Other pancytopenia: Secondary | ICD-10-CM | POA: Diagnosis not present

## 2015-09-06 DIAGNOSIS — K219 Gastro-esophageal reflux disease without esophagitis: Secondary | ICD-10-CM | POA: Diagnosis present

## 2015-09-06 DIAGNOSIS — F329 Major depressive disorder, single episode, unspecified: Secondary | ICD-10-CM | POA: Diagnosis present

## 2015-09-06 DIAGNOSIS — J449 Chronic obstructive pulmonary disease, unspecified: Secondary | ICD-10-CM | POA: Diagnosis present

## 2015-09-06 DIAGNOSIS — Z681 Body mass index (BMI) 19 or less, adult: Secondary | ICD-10-CM | POA: Diagnosis not present

## 2015-09-06 DIAGNOSIS — R569 Unspecified convulsions: Secondary | ICD-10-CM | POA: Diagnosis present

## 2015-09-06 DIAGNOSIS — I959 Hypotension, unspecified: Secondary | ICD-10-CM | POA: Diagnosis not present

## 2015-09-06 DIAGNOSIS — T730XXA Starvation, initial encounter: Secondary | ICD-10-CM | POA: Diagnosis present

## 2015-09-06 LAB — PROTIME-INR
INR: 1.29 (ref 0.00–1.49)
Prothrombin Time: 16.2 seconds — ABNORMAL HIGH (ref 11.6–15.2)

## 2015-09-06 LAB — BASIC METABOLIC PANEL
Anion gap: 12 (ref 5–15)
BUN: <5 mg/dL — ABNORMAL LOW (ref 6–20)
CALCIUM: 8.6 mg/dL — AB (ref 8.9–10.3)
CO2: 23 mmol/L (ref 22–32)
CREATININE: 0.57 mg/dL — AB (ref 0.61–1.24)
Chloride: 107 mmol/L (ref 101–111)
GFR calc non Af Amer: >60 mL/min (ref >60)
Glucose, Bld: 78 mg/dL (ref 65–99)
Potassium: 4.1 mmol/L (ref 3.5–5.1)
SODIUM: 142 mmol/L (ref 135–145)

## 2015-09-06 LAB — CBC
HCT: 37.8 % — ABNORMAL LOW (ref 39.0–52.0)
Hemoglobin: 12.4 g/dL — ABNORMAL LOW (ref 13.0–17.0)
MCH: 32.3 pg (ref 26.0–34.0)
MCHC: 32.8 g/dL (ref 30.0–36.0)
MCV: 98.4 fL (ref 78.0–100.0)
PLATELETS: 203 10*3/uL (ref 150–400)
RBC: 3.84 MIL/uL — AB (ref 4.22–5.81)
RDW: 15.4 % (ref 11.5–15.5)
WBC: 3 10*3/uL — AB (ref 4.0–10.5)

## 2015-09-06 LAB — LIPASE, BLOOD: Lipase: 21 U/L (ref 11–51)

## 2015-09-06 LAB — MRSA PCR SCREENING: MRSA BY PCR: NEGATIVE

## 2015-09-06 LAB — ETHANOL: Alcohol, Ethyl (B): 259 mg/dL — ABNORMAL HIGH (ref <5)

## 2015-09-06 LAB — CORTISOL: CORTISOL PLASMA: 21.6 ug/dL

## 2015-09-06 LAB — MAGNESIUM: Magnesium: 1.8 mg/dL (ref 1.7–2.4)

## 2015-09-06 LAB — LACTIC ACID, PLASMA
LACTIC ACID, VENOUS: 2.5 mmol/L — AB (ref 0.5–2.0)
Lactic Acid, Venous: 2.6 mmol/L (ref 0.5–2.0)

## 2015-09-06 LAB — APTT: aPTT: 32 seconds (ref 24–37)

## 2015-09-06 LAB — TROPONIN I

## 2015-09-06 LAB — CBG MONITORING, ED: GLUCOSE-CAPILLARY: 82 mg/dL (ref 65–99)

## 2015-09-06 MED ORDER — SODIUM CHLORIDE 0.9 % IV SOLN
INTRAVENOUS | Status: DC
  Administered 2015-09-06: via INTRAVENOUS
  Administered 2015-09-06: 1000 mL via INTRAVENOUS
  Administered 2015-09-07 – 2015-09-08 (×3): via INTRAVENOUS

## 2015-09-06 MED ORDER — LORAZEPAM 1 MG PO TABS
1.0000 mg | ORAL_TABLET | Freq: Four times a day (QID) | ORAL | Status: DC | PRN
  Administered 2015-09-06 – 2015-09-07 (×2): 1 mg via ORAL
  Filled 2015-09-06 (×2): qty 1

## 2015-09-06 MED ORDER — PANTOPRAZOLE SODIUM 40 MG PO TBEC
40.0000 mg | DELAYED_RELEASE_TABLET | Freq: Every day | ORAL | Status: DC
  Administered 2015-09-06 – 2015-09-08 (×3): 40 mg via ORAL
  Filled 2015-09-06 (×3): qty 1

## 2015-09-06 MED ORDER — LIDOCAINE VISCOUS 2 % MT SOLN
15.0000 mL | Freq: Four times a day (QID) | OROMUCOSAL | Status: DC | PRN
  Filled 2015-09-06: qty 15

## 2015-09-06 MED ORDER — LORAZEPAM 0.5 MG PO TABS
0.5000 mg | ORAL_TABLET | Freq: Three times a day (TID) | ORAL | Status: DC
  Administered 2015-09-06 – 2015-09-08 (×6): 0.5 mg via ORAL
  Filled 2015-09-06 (×7): qty 1

## 2015-09-06 MED ORDER — DEXTROSE-NACL 5-0.45 % IV SOLN
INTRAVENOUS | Status: DC
  Administered 2015-09-06: 01:00:00 via INTRAVENOUS

## 2015-09-06 MED ORDER — LIDOCAINE-PRILOCAINE 2.5-2.5 % EX CREA: 1.0000 "application " | TOPICAL_CREAM | CUTANEOUS | Status: DC | PRN

## 2015-09-06 MED ORDER — HYDROCORTISONE NA SUCCINATE PF 100 MG IJ SOLR
50.0000 mg | Freq: Two times a day (BID) | INTRAMUSCULAR | Status: DC
  Administered 2015-09-06 – 2015-09-08 (×5): 50 mg via INTRAVENOUS
  Filled 2015-09-06 (×3): qty 1
  Filled 2015-09-06: qty 2
  Filled 2015-09-06 (×4): qty 1

## 2015-09-06 MED ORDER — CITALOPRAM HYDROBROMIDE 20 MG PO TABS
20.0000 mg | ORAL_TABLET | Freq: Every day | ORAL | Status: DC
  Administered 2015-09-06 – 2015-09-08 (×3): 20 mg via ORAL
  Filled 2015-09-06 (×3): qty 1

## 2015-09-06 MED ORDER — ADULT MULTIVITAMIN W/MINERALS CH
1.0000 | ORAL_TABLET | Freq: Every day | ORAL | Status: DC
  Administered 2015-09-06 – 2015-09-08 (×3): 1 via ORAL
  Filled 2015-09-06 (×3): qty 1

## 2015-09-06 MED ORDER — FENTANYL 50 MCG/HR TD PT72
50.0000 ug | MEDICATED_PATCH | TRANSDERMAL | Status: DC
  Administered 2015-09-06: 50 ug via TRANSDERMAL
  Filled 2015-09-06: qty 2

## 2015-09-06 MED ORDER — MAGNESIUM HYDROXIDE 400 MG/5ML PO SUSP: 30.0000 mL | Freq: Every day | ORAL | Status: DC | PRN

## 2015-09-06 MED ORDER — SUCRALFATE 1 G PO TABS
1.0000 g | ORAL_TABLET | Freq: Three times a day (TID) | ORAL | Status: DC
  Administered 2015-09-06 – 2015-09-08 (×10): 1 g via ORAL
  Filled 2015-09-06 (×12): qty 1

## 2015-09-06 MED ORDER — SODIUM CHLORIDE 0.9 % IV BOLUS (SEPSIS)
500.0000 mL | Freq: Once | INTRAVENOUS | Status: AC
  Administered 2015-09-06: 500 mL via INTRAVENOUS

## 2015-09-06 MED ORDER — LORAZEPAM 2 MG/ML IJ SOLN: 1.0000 mg | Freq: Four times a day (QID) | INTRAMUSCULAR | Status: DC | PRN

## 2015-09-06 MED ORDER — SODIUM CHLORIDE 0.9 % IV BOLUS (SEPSIS)
1500.0000 mL | Freq: Once | INTRAVENOUS | Status: AC
  Administered 2015-09-06: 1500 mL via INTRAVENOUS

## 2015-09-06 MED ORDER — SODIUM CHLORIDE 0.9 % IJ SOLN
3.0000 mL | Freq: Two times a day (BID) | INTRAMUSCULAR | Status: DC
  Administered 2015-09-06 (×2): 3 mL via INTRAVENOUS
  Administered 2015-09-07: 10:00:00 via INTRAVENOUS

## 2015-09-06 MED ORDER — ONDANSETRON HCL 4 MG PO TABS
8.0000 mg | ORAL_TABLET | Freq: Three times a day (TID) | ORAL | Status: DC | PRN
  Administered 2015-09-06 – 2015-09-08 (×2): 8 mg via ORAL
  Filled 2015-09-06 (×2): qty 2

## 2015-09-06 MED ORDER — ACETAMINOPHEN 325 MG PO TABS: 650.0000 mg | ORAL_TABLET | Freq: Four times a day (QID) | ORAL | Status: DC | PRN

## 2015-09-06 MED ORDER — ENSURE PUDDING PO PUDG: 1.0000 | Freq: Three times a day (TID) | ORAL | Status: DC

## 2015-09-06 MED ORDER — ENOXAPARIN SODIUM 40 MG/0.4ML ~~LOC~~ SOLN
40.0000 mg | SUBCUTANEOUS | Status: DC
  Administered 2015-09-06 – 2015-09-08 (×3): 40 mg via SUBCUTANEOUS
  Filled 2015-09-06 (×3): qty 0.4

## 2015-09-06 MED ORDER — PRAVASTATIN SODIUM 20 MG PO TABS
20.0000 mg | ORAL_TABLET | Freq: Every day | ORAL | Status: DC
  Administered 2015-09-06 – 2015-09-08 (×3): 20 mg via ORAL
  Filled 2015-09-06 (×3): qty 1

## 2015-09-06 MED ORDER — LORAZEPAM 2 MG/ML IJ SOLN
0.0000 mg | Freq: Two times a day (BID) | INTRAMUSCULAR | Status: DC
Start: 2015-09-08 — End: 2015-09-08

## 2015-09-06 MED ORDER — VITAMIN B-1 100 MG PO TABS
100.0000 mg | ORAL_TABLET | Freq: Every day | ORAL | Status: DC
  Administered 2015-09-06 – 2015-09-08 (×3): 100 mg via ORAL
  Filled 2015-09-06 (×3): qty 1

## 2015-09-06 MED ORDER — HYDROCORTISONE SOD SUCCINATE 100 MG PF FOR IT USE: 50.0000 mg | Freq: Two times a day (BID) | INTRAMUSCULAR | Status: DC

## 2015-09-06 MED ORDER — ADULT MULTIVITAMIN W/MINERALS CH: 1.0000 | ORAL_TABLET | Freq: Every day | ORAL | Status: DC

## 2015-09-06 MED ORDER — SODIUM FLUORIDE 1.1 % DT CREA: TOPICAL_CREAM | Freq: Every day | DENTAL | Status: DC

## 2015-09-06 MED ORDER — BISACODYL 10 MG RE SUPP: 10.0000 mg | RECTAL | Status: DC | PRN

## 2015-09-06 MED ORDER — HYDROMORPHONE HCL 2 MG PO TABS
4.0000 mg | ORAL_TABLET | Freq: Four times a day (QID) | ORAL | Status: DC | PRN
  Administered 2015-09-08: 4 mg via ORAL
  Filled 2015-09-06: qty 2

## 2015-09-06 MED ORDER — IBUPROFEN 100 MG/5ML PO SUSP: 200.0000 mg | ORAL | Status: DC | PRN

## 2015-09-06 MED ORDER — ACETAMINOPHEN 650 MG RE SUPP: 650.0000 mg | Freq: Four times a day (QID) | RECTAL | Status: DC | PRN

## 2015-09-06 MED ORDER — FOLIC ACID 1 MG PO TABS
1.0000 mg | ORAL_TABLET | Freq: Every day | ORAL | Status: DC
  Administered 2015-09-06 – 2015-09-08 (×3): 1 mg via ORAL
  Filled 2015-09-06 (×3): qty 1

## 2015-09-06 MED ORDER — FLUDROCORTISONE ACETATE 0.1 MG PO TABS
0.1000 mg | ORAL_TABLET | Freq: Every day | ORAL | Status: DC
  Administered 2015-09-06 – 2015-09-08 (×3): 0.1 mg via ORAL
  Filled 2015-09-06 (×3): qty 1

## 2015-09-06 MED ORDER — MAGNESIUM SULFATE 2 GM/50ML IV SOLN
2.0000 g | Freq: Once | INTRAVENOUS | Status: AC
  Administered 2015-09-06: 2 g via INTRAVENOUS
  Filled 2015-09-06: qty 50

## 2015-09-06 MED ORDER — MORPHINE SULFATE (CONCENTRATE) 10 MG/0.5ML PO SOLN
20.0000 mg | ORAL | Status: DC | PRN
  Administered 2015-09-06: 20 mg via ORAL
  Filled 2015-09-06: qty 1

## 2015-09-06 MED ORDER — LORAZEPAM 2 MG/ML IJ SOLN
0.0000 mg | Freq: Four times a day (QID) | INTRAMUSCULAR | Status: AC
  Administered 2015-09-06: 2 mg via INTRAVENOUS
  Administered 2015-09-07: 1 mg via INTRAVENOUS
  Filled 2015-09-06 (×2): qty 1

## 2015-09-06 NOTE — ED Notes (Signed)
Patient transported to X-ray 

## 2015-09-06 NOTE — Progress Notes (Addendum)
Patient seen and examined  40 y.o. male with PMH of subglottis throat cancer (s/p of surgery, chemotherapy and radiation therapy), orthostatic hypotension, sp of PEG tube, hyperlipidemia, COPD, GERD, alcohol abuse, seizure, who presents with hypotension.In ED, patient was found to have hypotension with blood pressure 79/58, which improved to 94/67 after IV fluids, lactate is 3.42, negative UDS, negative urinalysis, negative troponin, lipase 21, alcohol level 259, temperature normal, WBCs 3.5, electrolytes and renal function okay.  Patient states that he has been vomiting, denies any hematemesis or hematochezia, blood pressure still in the 0000000 systolic   Plan #1 continue aggressive IV hydration for patient's hypotension, repeat another bolus, check cortisol level, start stress dose steroids, previous cortisol levels have been low, continue Florinef  #2, continue CIWA protocol  #3 elevated lactate likely in the setting of dehydration, continue aggressive fluid resuscitation  #4-chest x-ray to rule out aspiration  5. 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

## 2015-09-06 NOTE — ED Notes (Signed)
Critical lactic acid, 2.5 from Amy in lab.

## 2015-09-06 NOTE — Telephone Encounter (Signed)
CALLED PATIENT TO ASK ABOUT RESCHEDULING MISSED PET AND FU, LVM FOR A RETURN CALL

## 2015-09-06 NOTE — H&P (Addendum)
[facility] History and Physical  Marvin [name] [id number] DOB: Dec 29, 1975 DOA: 09/05/2015  Referring physician: ED physician PCP: No PCP Per Patient  Specialists:   Chief Complaint: syncope and hypotension  HPI: Marvin [name] is a 40 y.o. male with PMH of subglottis throat cancer (s/p of surgery, chemotherapy and radiation therapy), orthostatic hypotension, sp of PEG tube, hyperlipidemia, COPD, GERD, alcohol abuse, seizure, who presents with hypotension.  Per report, pt has been in a nursing home, but left 4 days ago on AMA because he was not getting along with his nursing home. Note from the nurse states his sister said he has been drinking heavily. He has not been able to eat or drink normally in the last 4 days. He has a history of orthostatic hypotension. Currently taking fludrocortisone. He needed to have IV fluid to keep his blood pressure up, but he probably has not been getting IV fluids. He states that his bp has been running low. He reportedly passed out today. He reports that he has diffuse abdominal pain, which seems to be chronic issue. He does not have diarrhea, symptoms of UTI, cough, chest pain, shortness of breath, unilateral weakness. He also is somewhat depressed and has occasional suicidal thoughts per report, but denies suicidal homicidal ideations currently.   In ED, patient was found to have hypotension with blood pressure 79/58, which improved to 94/67 after IV fluids, lactate is 3.42, negative UDS, negative urinalysis, negative troponin, lipase 21, alcohol level 259, temperature normal, WBCs 3.5, electrolytes and renal function okay. Patient submitted 3 inpatient for further eval and treatment.   EKG: Independently reviewed.   QTC 442, no ischemic change.  Where does patient live?   used to be in  SNF, come from home today. Can patient participate in ADLs?   None   Review of Systems:   General: no fevers, chills, no changes in body weight, has  poor appetite, has fatigue HEENT: no blurry vision, hearing changes or sore throat Pulm: no dyspnea, coughing, wheezing CV: no chest pain, palpitations Abd: no nausea, vomiting, has abdominal pain, no diarrhea, constipation GU: no dysuria, burning on urination, increased urinary frequency, hematuria  Ext: no leg edema Neuro: no unilateral weakness, numbness, or tingling, no vision change or hearing loss Skin: no rash MSK: No muscle spasm, no deformity, no limitation of range of movement in spin Heme: No easy bruising.  Travel history: No recent long distant travel.  Allergy: No Known Allergies  Past Medical History  Diagnosis Date  . ETOH abuse   . Alcohol dependence ([place]) 04/17/2012  . COPD (chronic obstructive pulmonary disease) ([place])   . Shortness of breath dyspnea     occ  . Anxiety   . GERD (gastroesophageal reflux disease)   . Seizures ([place])     one episode 09/2012, suspected related to ETOH withdrawal  . Anxiety disorder 03/12/2015  . Headache 05/10/2015  . Hypokalemia 05/10/2015  . 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 Cancer ([place])   . Throat cancer [facility])     Past Surgical History  Procedure Laterality Date  . Knee surgery Left     kneecap -screws  . Appendectomy    . Tracheostomy tube placement N/A 02/22/2015    Procedure: TRACHEOSTOMY;  Surgeon: [name], MD;  Location: [place];  Service: ENT;  Laterality: N/A;  awake tracheostomy  . Direct laryngoscopy N/A 02/22/2015    Procedure: DIRECT LARYNGOSCOPY;  Surgeon: [name], MD;  Location: [place];  Service: ENT;  Laterality: N/A;  direct laryngoscopy with biopsy  . Multiple extractions with alveoloplasty N/A 03/05/2015    Procedure: Extraction of tooth #'s 1,2,8,9,15,16,17,18,31,32 with alveoloplasty, mandibular right lateral exostoses, and gross debridement of remaining teeth;  Surgeon: Lenn Cal, DDS;  Location: [place];  Service: Oral Surgery;  Laterality: N/A;    Social History:  reports that he quit  smoking about 7 months ago. His smoking use included Cigarettes. He has a 28 pack-year smoking history. He has never used smokeless tobacco. He reports that he drinks alcohol. He reports that he does not use illicit drugs.  Family History:  Family History  Problem Relation Age of Onset  . Cancer Mother   . Heart attack Maternal Grandmother     age of death 88  . Heart attack Maternal Grandfather   . Cancer Paternal Grandfather      Prior to Admission medications   Medication Sig Start Date End Date Taking? Authorizing Provider  feeding supplement, ENSURE, (ENSURE) PUDG Take 1 Container by mouth 3 (three) times daily between meals.   Yes Historical Provider, MD  potassium chloride SA (K-DUR,KLOR-CON) 20 MEQ tablet Take 20 mEq by mouth 3 (three) times daily.   Yes Historical Provider, MD  bisacodyl (DULCOLAX) 10 MG suppository Place 10 mg rectally as needed for moderate constipation.    Historical Provider, MD  citalopram (CELEXA) 20 MG tablet Take 20 mg by mouth daily.     Historical Provider, MD  fentaNYL (DURAGESIC - DOSED MCG/HR) 50 MCG/HR Apply one patch topically every 72 hours for pain. Remove old patch. External use only. 06/18/15   Tiffany L Reed, DO  fludrocortisone (FLORINEF) 0.1 MG tablet Take 2 tablets (0.2 mg total) by mouth daily. Patient taking differently: Take 0.1 mg by mouth daily.  08/16/15   Skeet Latch, MD  folic acid (FOLVITE) 1 MG tablet Take 1 tablet (1 mg total) by mouth daily. 03/08/15   [name], MD  HYDROmorphone (DILAUDID) 4 MG tablet Take 1 tablets 30 minutes before meals, up to maximum 6 tablets per day if needed 06/29/15   [name], MD  ibuprofen (ADVIL,MOTRIN) 100 MG/5ML suspension Take 200-400 mg by mouth every 4 (four) hours as needed for mild pain.     Historical Provider, MD  lidocaine (XYLOCAINE) 2 % solution Mix 1 part 2%viscous lidocaine,1part H2O.Swish and/or swallow 34ml of this mixture,65min before meals and at bedtime, up to QID Patient  taking differently: Mix 1 part 2%viscous lidocaine,1part H2O.Swish and/or swallow 77ml of this mixture,8min before meals and at bedtime, up to QID as needed for sore throat 04/02/15   [name], MD  lidocaine-prilocaine (EMLA) cream Apply 1 application topically as needed (FOR PORT ACCESS).    Historical Provider, MD  LORazepam (ATIVAN) 1 MG tablet Take 0.5 mg by mouth 3 (three) times daily.     Historical Provider, MD  magnesium hydroxide (MILK OF MAGNESIA) 400 MG/5ML suspension Take 30 mLs by mouth daily as needed for mild constipation.    Historical Provider, MD  morphine (ROXANOL) 20 MG/ML concentrated solution Take 1 mL (20 mg total) by mouth every 2 (two) hours as needed for moderate pain or severe pain. 03/26/15   [name], MD  Multiple Vitamin (MULTIVITAMIN WITH MINERALS) TABS tablet Take 1 tablet by mouth daily. 03/08/15   [name], MD  omeprazole (PRILOSEC) 20 MG capsule Take 20 mg by mouth daily.     Historical Provider, MD  ondansetron (ZOFRAN) 8 MG tablet 8 mg by PEG Tube  route every 8 (eight) hours as needed for nausea or vomiting. Reported on 08/17/2015    Historical Provider, MD  pravastatin (PRAVACHOL) 20 MG tablet Take 20 mg by mouth daily.     Historical Provider, MD  prochlorperazine (COMPAZINE) 10 MG tablet 10 mg by PEG Tube route every 6 (six) hours as needed for nausea or vomiting. Reported on 08/17/2015    Historical Provider, MD  sodium fluoride (PREVIDENT 5000 PLUS) 1.1 % CREA dental cream Apply thin ribbon of cream to tooth brush. Brush teeth for 2 minutes. Spit out excess-DO NOT swallow. DO NOT rinse afterwards. Repeat nightly. 04/03/15   Lenn Cal, DDS  sodium phosphate (FLEET) enema Place 1 enema rectally once as needed (for constipation). follow package directions    Historical Provider, MD  sucralfate (CARAFATE) 1 G tablet Dissolve 1 tablet in 82ml H2O and swallow up to QID,PRN sore throat. 04/02/15   [name], MD  thiamine 100 MG tablet Take 1 tablet (100  mg total) by mouth daily. 03/08/15   [name], MD    Physical Exam: Filed Vitals:   09/06/15 0601 09/06/15 0631 09/06/15 [id number] 09/06/15 0700  BP:  97/57 95/59 93/61   Pulse: 58 58  54  Temp:      TempSrc:      Resp: 11  13 15   Height:      Weight:      SpO2: 99%   98%   General: Not in acute distress HEENT:       Eyes: PERRL, EOMI, no scleral icterus.       ENT: No discharge from the ears and nose, no pharynx injection, no tonsillar enlargement.        Neck: No JVD, no bruit, no mass felt. Heme: No neck lymph node enlargement. Cardiac: S1/S2, RRR, No murmurs, No gallops or rubs. Pulm: No rales, wheezing, rhonchi or rubs. Has Port-A line over right upper chest without signs of infection. Abd: Soft, nondistended, diffused tenderness, no rebound pain, no organomegaly, BS present. Has PEG tube in place. Ext: No pitting leg edema bilaterally. 2+DP/PT pulse bilaterally. Musculoskeletal: No joint deformities, No joint redness or warmth, no limitation of ROM in spin. Skin: No rashes.  Neuro: Alert, oriented X3, cranial nerves II-XII grossly intact, moves all extremities. Psych: Patient is not psychotic, no suicidal or hemocidal ideation.  Labs on Admission:  Basic Metabolic Panel:  Recent Labs Lab 08/30/15 0836 09/05/15 2317 09/06/15 0500  NA 142 141 142  K 4.0 3.9 4.1  CL  --  101 107  CO2 30* 26 23  GLUCOSE 87 81 78  BUN 5.1* <5* <5*  CREATININE 0.7 0.76 0.57*  CALCIUM 9.0 9.0 8.6*  MG 1.9  --   --    Liver Function Tests:  Recent Labs Lab 08/30/15 0836 09/05/15 2317  AST 17 29  ALT <9 11*  ALKPHOS 107 127*  BILITOT 0.56 0.5  PROT 6.5 7.1  ALBUMIN 3.4* 3.8    Recent Labs Lab 09/06/15 0156  LIPASE 21   No results for input(s): AMMONIA in the last 168 hours. CBC:  Recent Labs Lab 08/30/15 0836 09/05/15 2317 09/06/15 0500  WBC 3.2* 3.5* 3.0*  NEUTROABS 1.6 1.6*  --   HGB 11.3* 12.7* 12.4*  HCT 34.2* 38.2* 37.8*  MCV 96.2 97.4 98.4  PLT 211 203  203   Cardiac Enzymes:  Recent Labs Lab 09/06/15 0018  TROPONINI <0.03    BNP (last 3 results) No results for input(s): BNP in  the last 8760 hours.  ProBNP (last 3 results) No results for input(s): PROBNP in the last 8760 hours.  CBG: No results for input(s): GLUCAP in the last 168 hours.  Radiological Exams on Admission: No results found.  Assessment/Plan Principal Problem:   Hypotension Active Problems:   Alcohol dependence (HCC)   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HCC)   Protein-calorie malnutrition, severe (HCC)   Dysphagia   GERD (gastroesophageal reflux disease)   Anxiety disorder   Alcohol abuse   Orthostatic hypotension   Anemia due to chemotherapy   Stomal mucositis (HCC)   elevated lactate   Hypotension and syncope: Patient's syncope is most likely due to hypotension and orthostatic hypotension. Patient has been drinking heavily, did not eat normally. He also did not get IV fluids which he needed for keeping his blood pressure up.  -Will admitted to telemetry bed -IV fluid: Normal saline 2.5 L, followed by 100 mL per hour of D5-1/2 NS -Continue Fludrocortisone  Alcohol abuse: -Did counseling about the importance of quitting drinking -CIWA protocol  Carcinoma of supraglottis [facility]): s/p of surgery, chemotherapy and radiation therapy. He has been followed up by Dr. Ernst Spell.  -Follow-up with Dr. Ernst Spell  Protein-calorie malnutrition, severe [facility]): -Ensure  GERD: -Protonix and Carafate  Elevated lactate: Patient meets sepsis criteria with elevated lactate, neutropenia, tachycardia, but no signs of infection. Elevated lactate is likely due to starvation. -IVF as above -will get trend lactic acid levels  Depression and anxiety: Stable, no suicidal or homicidal ideations. -Continue home medications: Celexa and a when necessary Ativan  HLD: Last LDL was 56 on 07/19/15 -Continue home medications: Pravastatin  Abdominal pain: Seems to be a chronic issue. Lipase  normal -Continue home pain medication: Fentanyl patch, Dilaudid when necessary, ibuprofen when necessary, and concentrated morphine   DVT ppx: SQ Lovenox  Code Status: Full code Family Communication: None at bed side.   Disposition Plan: Admit to inpatient   Date of Service 09/06/2015    Ivor Costa [facility] Pager [phone]  If 7PM-7AM, please contact night-coverage [email or web address] Password [email or web address] 09/06/2015, 7:10 AM

## 2015-09-07 ENCOUNTER — Encounter: Payer: Self-pay | Admitting: *Deleted

## 2015-09-07 ENCOUNTER — Ambulatory Visit
Admission: RE | Admit: 2015-09-07 | Discharge: 2015-09-07 | Disposition: A | Discharge status: Home / Self Care | Payer: Medicaid Other | Source: Ambulatory Visit | Attending: Radiation Oncology | Admitting: Radiation Oncology

## 2015-09-07 ENCOUNTER — Inpatient Hospital Stay (HOSPITAL_COMMUNITY): Payer: Medicaid Other

## 2015-09-07 DIAGNOSIS — R1310 Dysphagia, unspecified: Secondary | ICD-10-CM

## 2015-09-07 DIAGNOSIS — I951 Orthostatic hypotension: Principal | ICD-10-CM

## 2015-09-07 DIAGNOSIS — E872 Acidosis: Secondary | ICD-10-CM

## 2015-09-07 DIAGNOSIS — R070 Pain in throat: Secondary | ICD-10-CM

## 2015-09-07 DIAGNOSIS — K9429 Other complications of gastrostomy: Secondary | ICD-10-CM

## 2015-09-07 DIAGNOSIS — T451X5A Adverse effect of antineoplastic and immunosuppressive drugs, initial encounter: Secondary | ICD-10-CM

## 2015-09-07 DIAGNOSIS — D61818 Other pancytopenia: Secondary | ICD-10-CM

## 2015-09-07 DIAGNOSIS — F1010 Alcohol abuse, uncomplicated: Secondary | ICD-10-CM

## 2015-09-07 DIAGNOSIS — C321 Malignant neoplasm of supraglottis: Secondary | ICD-10-CM | POA: Insufficient documentation

## 2015-09-07 DIAGNOSIS — C7800 Secondary malignant neoplasm of unspecified lung: Secondary | ICD-10-CM | POA: Insufficient documentation

## 2015-09-07 DIAGNOSIS — E43 Unspecified severe protein-calorie malnutrition: Secondary | ICD-10-CM

## 2015-09-07 DIAGNOSIS — K9281 Gastrointestinal mucositis (ulcerative): Secondary | ICD-10-CM

## 2015-09-07 DIAGNOSIS — D6481 Anemia due to antineoplastic chemotherapy: Secondary | ICD-10-CM

## 2015-09-07 LAB — COMPREHENSIVE METABOLIC PANEL
ALK PHOS: 123 U/L (ref 38–126)
ALT: 10 U/L — AB (ref 17–63)
AST: 22 U/L (ref 15–41)
Albumin: 3.2 g/dL — ABNORMAL LOW (ref 3.5–5.0)
Anion gap: 7 (ref 5–15)
BILIRUBIN TOTAL: 0.6 mg/dL (ref 0.3–1.2)
BUN: 8 mg/dL (ref 6–20)
CALCIUM: 8.8 mg/dL — AB (ref 8.9–10.3)
CO2: 26 mmol/L (ref 22–32)
CREATININE: 0.64 mg/dL (ref 0.61–1.24)
Chloride: 106 mmol/L (ref 101–111)
GFR calc Af Amer: >60 mL/min (ref >60)
GLUCOSE: 114 mg/dL — AB (ref 65–99)
POTASSIUM: 3.9 mmol/L (ref 3.5–5.1)
Sodium: 139 mmol/L (ref 135–145)
TOTAL PROTEIN: 6.1 g/dL — AB (ref 6.5–8.1)

## 2015-09-07 LAB — CBC
HEMATOCRIT: 38.7 % — AB (ref 39.0–52.0)
HEMOGLOBIN: 12.2 g/dL — AB (ref 13.0–17.0)
MCH: 31.5 pg (ref 26.0–34.0)
MCHC: 31.5 g/dL (ref 30.0–36.0)
MCV: 100 fL (ref 78.0–100.0)
Platelets: 172 10*3/uL (ref 150–400)
RBC: 3.87 MIL/uL — ABNORMAL LOW (ref 4.22–5.81)
RDW: 15.5 % (ref 11.5–15.5)
WBC: 2.7 10*3/uL — AB (ref 4.0–10.5)

## 2015-09-07 LAB — GLUCOSE, CAPILLARY: GLUCOSE-CAPILLARY: 114 mg/dL — AB (ref 65–99)

## 2015-09-07 MED ORDER — DIPHENHYDRAMINE HCL 25 MG PO CAPS
25.0000 mg | ORAL_CAPSULE | Freq: Once | ORAL | Status: AC
  Administered 2015-09-07: 25 mg via ORAL
  Filled 2015-09-07: qty 1

## 2015-09-07 MED ORDER — ENSURE ENLIVE PO LIQD
237.0000 mL | Freq: Three times a day (TID) | ORAL | Status: DC
  Administered 2015-09-07: 237 mL via ORAL

## 2015-09-07 MED ORDER — IOHEXOL 300 MG/ML  SOLN
75.0000 mL | Freq: Once | INTRAMUSCULAR | Status: AC | PRN
  Administered 2015-09-07: 75 mL via INTRAVENOUS

## 2015-09-07 NOTE — Progress Notes (Signed)
  Oncology Nurse Navigator Documentation Navigator Location: [facility] (09/07/15 1535) Navigator Encounter Type: Other (09/07/15 1535)           Patient Visit Type: Inpatient (09/07/15 1535)     To provide support and encouragement, care continuity and to assess for needs, visited Marvin [name] in [place].  He reported feeling better since his admission, that his plan is to return to [place] when [facility] where he will have access to his medications. He did not express any needs or concerns at this time, I encouraged him to contact me if that changes, he verbalized understanding.  [name], RN, BSN, [place] [facility] [place] [phone]                              Time Spent with Patient: 15 (09/07/15 1535)

## 2015-09-07 NOTE — Progress Notes (Signed)
Prather Linenberger   DOB:12-25-1975   [id number]   Oncology History   Carcinoma of supraglottis   Staging form: Larynx - Supraglottis, AJCC 7th Edition     Clinical stage from 03/13/2015: Stage IVA (T3, N2b, M0) - Signed by [name], MD on 03/13/2015     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[place])   01/22/2015 Imaging CT scan showed enhancing infiltrative mass centered at the right piriform sinus with bilateral neck lymphadenopathy   02/22/2015 - 03/08/2015 Hospital Admission He was admitted to the hospital for management of advanced supraglottic cancer status post tracheostomy, biopsy, dental extraction, placement of feeding tube and subsequent discharge to skilled nursing facility   02/22/2015 Pathology Results Accession: [id number] 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   03/09/2015 PET scan 1)  Right piriform sinus mass with right level 2 nodal metastasis.  2)  Hypermetabolic thoracic nodes which are at least partially felt to be reactive.  3)  No evidence of subdiaphragmatic metastatic disease.    03/16/2015 Procedure He has placement of port   03/21/2015 - 05/04/2015 Chemotherapy He received high dose cisplatin x 3 cycles   03/21/2015 - 05/09/2015 Radiation Therapy Tomotherapy to supraglottic tumor and bilateral neck:  70 Gy in 35 fractions to gross disease, 63 Gy in 35 fractions to high risk nodal echelons, and 56 Gy in 35 fractions to intermediate risk nodal echelons.   05/09/2015 Miscellaneous He went to the ED with fall    Subjective: admitted due to weakness and hypotensive. He was in pain. Left SNF without medications. Was drinking prior to admission Denies cough or recent fever. He is eating by mouth. Denies recent aspiration  Objective:  Filed Vitals:   09/07/15 1200 09/07/15 1340  BP: 127/82 126/84  Pulse: 64 60  Temp:  98.7 F (37.1 C)  Resp:  16     Intake/Output  Summary (Last 24 hours) at 09/07/15 1514 Last data filed at 09/07/15 1015  Gross per 24 hour  Intake   1400 ml  Output    551 ml  Net    849 ml    GENERAL:alert, no distress and comfortable SKIN: skin color, texture, turgor are normal, no rashes or significant lesions EYES: normal, Conjunctiva are pink and non-injected, sclera clear OROPHARYNX:no exudate, no erythema and lips, buccal mucosa, and tongue normal  NECK: supple, thyroid normal size, non-tender, without nodularity LYMPH:  no palpable lymphadenopathy in the cervical, axillary or inguinal LUNGS: clear to auscultation and percussion with normal breathing effort HEART: regular rate & rhythm and no murmurs and no lower extremity edema ABDOMEN:abdomen soft, non-tender and normal bowel sounds. Feeding tube looks ok Musculoskeletal:no cyanosis of digits and no clubbing  NEURO: alert & oriented x 3 with fluent speech, no focal motor/sensory deficits   Labs:  Lab Results  Component Value Date   WBC 2.7* 09/07/2015   HGB 12.2* 09/07/2015   HCT 38.7* 09/07/2015   MCV 100.0 09/07/2015   PLT 172 09/07/2015   NEUTROABS 1.6* 09/05/2015    Lab Results  Component Value Date   NA 139 09/07/2015   K 3.9 09/07/2015   CL 106 09/07/2015   CO2 26 09/07/2015    Studies:  Dg Chest 2 View  09/06/2015  CLINICAL DATA:  History of throat carcinoma. Currently with shortness of breath and weakness. Hypotension. EXAM: CHEST  2 VIEW COMPARISON:  March 06, 2015 FINDINGS: Lungs are somewhat hyperexpanded. There  is a 1 cm nodular opacity in the left upper lobe peripherally, not present previously. There is a 1.3 x 1.0 cm nodular opacity in the right perihilar region, not present previously. There is a 7 mm nodular opacity in the periphery the right mid lung, not present previously. There is no edema or consolidation. A small metallic foreign body in the left upper lobe region is stable. Heart size is normal. Pulmonary vascularity is within normal  limits. No adenopathy is apparent. Port-A-Cath tip is in the superior vena cava. No pneumothorax. No bone lesions. IMPRESSION: Nodular opacities bilaterally appear new compared to prior study and are concerning for small metastatic foci. No edema or consolidation. Lungs are somewhat hyperexpanded. No adenopathy is apparent by radiography. Electronically Signed   By: Lowella Grip III M.D.   On: 09/06/2015 10:16    Assessment & Plan:  Supraglottic cancer Possible lung mets He was supposed to get staging PET scan on the day of admission. It was cancelled. CXR showed possible lung mets Plan to order CT chest to assess, also to exclude possible pneumonia  Pancytopenia Due to treatment asymtomatic Observe. No need GCSF or transfusion for anemia  Orthostatic hypotension Seen by cardiology Resume Florinef and IVF, improving  Chronic throat pain Prn pain meds  Discharge planning Hopefully next week.   Will follow after Ct results are out   [facility], [place], MD 09/07/2015  3:14 PM

## 2015-09-07 NOTE — Progress Notes (Signed)
OT Cancellation Note  Patient Details Name: Marvin [name] MRN: [id number] DOB: 26-Jun-1976   Cancelled Treatment:    Reason Eval/Treat Not Completed: Other (comment) Student RN reports that pt recently got bad news and would not want to participate in OT at this time.  Will check back over weekend, if schedule permits, and see if he needs anything from [place].  He was independent with PT earlier.   Drusilla Wampole 09/07/2015, 11:39 AM  Lesle Chris, OTR/L [phone] 09/07/2015

## 2015-09-07 NOTE — Evaluation (Signed)
Physical Therapy Evaluation Patient Details Name: Marvin [name] MRN: [id number] DOB: 01/14/1976 Today's Date: 09/07/2015   History of Present Illness  40 y.o. male with h/o throat cancer (s/p chemo, radiation, and surgery), PEG, orthostatic hypotension, COPD, EtOH, Seizure who left his SNF AMA 4 days PTA and didn't take his usual medications for hypotension. Pt admitted with hypotension and EtOH.   Clinical Impression  Pt is independent with mobility, he ambulated 350' without an assistive device with no loss of balance. He denied dizziness throughout PT Eval despite significant drop in systolic BP with sit to stand. 122/70 sitting, 103/82 standing. No further PT needs, will sign off.     Follow Up Recommendations No PT follow up    Equipment Recommendations  None recommended by PT    Recommendations for Other Services       Precautions / Restrictions Precautions Precautions: Fall Restrictions Weight Bearing Restrictions: No      Mobility  Bed Mobility Overal bed mobility: Independent                Transfers Overall transfer level: Independent                  Ambulation/Gait Ambulation/Gait assistance: Independent Ambulation Distance (Feet): 350 Feet Assistive device: None Gait Pattern/deviations: WFL(Within Functional Limits)   Gait velocity interpretation: at or above normal speed for age/gender General Gait Details: steady, no dizziness nor LOB  Stairs            Wheelchair Mobility    Modified Rankin (Stroke Patients Only)       Balance Overall balance assessment: History of Falls;No apparent balance deficits (not formally assessed) (pt reports h/o falls related to hypotension, no dizziness during PT eval)                                           Pertinent Vitals/Pain Pain Assessment: No/denies pain    Home Living Family/patient expects to be discharged to:: Private residence Living Arrangements: Other  relatives (lives with brother) Available Help at Discharge: Family;Neighbor;Available 24 hours/day Type of Home: House Home Access: Stairs to enter Entrance Stairs-Rails: Can reach both;Left;Right Entrance Stairs-Number of Steps: 3 Home Layout: One level Home Equipment: Walker - 4 wheels      Prior Function Level of Independence: Independent         Comments: hasn't used rollator in several months     Hand Dominance        Extremity/Trunk Assessment   Upper Extremity Assessment: Overall WFL for tasks assessed           Lower Extremity Assessment: Overall WFL for tasks assessed      Cervical / Trunk Assessment: Other exceptions  Communication   Communication: No difficulties  Cognition Arousal/Alertness: Awake/alert Behavior During Therapy: WFL for tasks assessed/performed Overall Cognitive Status: Within Functional Limits for tasks assessed                      General Comments      Exercises        Assessment/Plan    PT Assessment Patent does not need any further PT services  PT Diagnosis     PT Problem List    PT Treatment Interventions     PT Goals (Current goals can be found in the Care Plan section) Acute Rehab PT Goals Patient Stated Goal: to  go home PT Goal Formulation: All assessment and education complete, DC therapy    Frequency     Barriers to discharge        Co-evaluation               End of Session Equipment Utilized During Treatment: Gait belt Activity Tolerance: Patient tolerated treatment well Patient left: in bed;with bed alarm set;with call bell/phone within reach Nurse Communication: Mobility status         Time: [id number] PT Time Calculation (min) (ACUTE ONLY): 18 min   Charges:   PT Evaluation $PT Eval Low Complexity: 1 Procedure     PT G CodesPhilomena Doheny 09/07/2015, 9:34 AM [phone]

## 2015-09-07 NOTE — Care Management Note (Signed)
Case Management Note  Patient Details  Name: Marvin [name] MRN: [id number] Date of Birth: 08-Dec-1975  Subjective/Objective: 40 y/o m admitted w/hypotension.[id number] Ca, PEG. Left SNF-[facility] AMA,went home. CSW following for SNF.                  Action/Plan:d/c plan SNF   Expected Discharge Date:   (unknown)               Expected Discharge Plan:  [place]  In-House Referral:  Clinical Social Work  Discharge planning Services  CM Consult  Post Acute Care Choice:    Choice offered to:     DME Arranged:    DME Agency:     HH Arranged:    [place] Agency:     Status of Service:  In process, will continue to follow  Medicare Important Message Given:    Date Medicare IM Given:    Medicare IM give by:    Date Additional Medicare IM Given:    Additional Medicare Important Message give by:     If discussed at [place] of Stay Meetings, dates discussed:    Additional Comments:  Dessa Phi, RN 09/07/2015, 1:54 PM

## 2015-09-07 NOTE — Progress Notes (Signed)
CSW spoke with patient re: discharge planning. Patient was at [facility] but discharged [place] 4 days ago. Patient plans to return to [place]. Katharine Look at [facility] aware that patient would like a different roommate.   CSW confirmed with Katharine Look at [facility] that they would be able to take patient over the weekend if ready.   Please call weekend CSW (ph#: [phone]) to facilitate discharge.      Raynaldo Opitz, [facility] Clinical Social Worker cell #: [phone]

## 2015-09-07 NOTE — Progress Notes (Signed)
Initial Nutrition Assessment  DOCUMENTATION CODES:   Severe malnutrition in context of chronic illness, Underweight  INTERVENTION:  - Will order Ensure Enlive po TID, each supplement provides 350 kcal and 20 grams of protein - RD will continue to monitor for needs  NUTRITION DIAGNOSIS:   Increased nutrient needs related to catabolic illness as evidenced by estimated needs.  GOAL:   Patient will meet greater than or equal to 90% of their needs  MONITOR:   PO intake, Supplement acceptance, Weight trends, Labs, I & O's  REASON FOR ASSESSMENT:   Rounds  ASSESSMENT:   40 y.o. male with PMH of subglottis throat cancer (s/p of surgery, chemotherapy and radiation therapy), orthostatic hypotension, sp of PEG tube, hyperlipidemia, COPD, GERD, alcohol abuse, seizure, who presents with hypotension. Per report, pt has been in a nursing home, but left 4 days ago on AMA because he was not getting along with his nursing home. Note from the nurse states his sister said he has been drinking heavily. He has not been able to eat or drink normally in the last 4 days. He has a history of orthostatic hypotension. Currently taking fludrocortisone. He needed to have IV fluid to keep his blood pressure up, but he probably has not been getting IV fluids. He states that his bp has been running low. He reportedly passed out today. He reports that he has diffuse abdominal pain, which seems to be chronic issue. He does not have diarrhea, symptoms of UTI, cough, chest pain, shortness of breath, unilateral weakness. He also is somewhat depressed and has occasional suicidal thoughts per report, but denies suicidal homicidal ideations currently.   Pt seen following learning about pt in [place] this AM. BMI indicates underweight status. Per report, pt ate 100% of breakfast this AM which consisted of eggs, sausage, and grits. He states that for lunch he had chicken, mashed potatoes, and "other things." Pt states that for  the 4 days PTA he did not eat well due to drinking but that prior to this, when he was at [facility], he had a very good appetite and was eating well. He states that he was consuming Ensure or Boost TID or QID. He was provided with very soft foods only, per his report, while at [facility] and did not receive meat items there. He denies any chewing or swallowing difficulties on current diet order. Pt states he is having pain around PEG site and that he does not use PEG and is interested in it being removed. Pt denies nausea with eating but states that he does not experience increase in abdominal pain if he focuses on eating smaller meals.  Pt states that following previous chemo for throat cancer he weighed ~120 lbs and that he has been gaining weight since that time. He states recent UBW of 140 lbs. Confirmed by chart review that pt has been gaining weight (12 lbs in 3 months). Physical assessment shows moderate and severe muscle and moderate and severe fat wasting.   Likely meeting needs but increased intakes preferential to aid in further weight gain, per pt goal. Medications reviewed. Labs reviewed.   Diet Order:  DIET DYS 3 Room service appropriate?: Yes; Fluid consistency:: Thin  Skin:  Reviewed, no issues  Last BM:  1/20  Height:   Ht Readings from Last 1 Encounters:  09/05/15 6\' 1"  (1.854 m)    Weight:   Wt Readings from Last 1 Encounters:  09/05/15 140 lb (63.504 kg)    Ideal Body Weight:  83.64 kg (kg)  BMI:  Body mass index is 18.47 kg/(m^2).  Estimated Nutritional Needs:   Kcal:  1900-2100  Protein:  75-85 grams  Fluid:  >/= 2 L/day  EDUCATION NEEDS:   No education needs identified at this time     Jarome Matin, RD, LDN Inpatient Clinical Dietitian Pager # [phone] After hours/weekend pager # [phone]

## 2015-09-07 NOTE — Progress Notes (Signed)
[facility] PROGRESS NOTE  Marvin [name] [id number] DOB: 1976-04-11 DOA: 09/05/2015 PCP: No PCP Per Patient  HPI/Brief narrative 40 y.o. male with PMH of subglottis throat cancer (s/p of surgery, chemotherapy and radiation therapy), orthostatic hypotension, sp of PEG tube, hyperlipidemia, COPD, GERD, alcohol abuse, seizure, who presents with hypotension.In ED, patient was found to have hypotension with blood pressure 79/58, which improved to 94/67 after IV fluids, lactate is 3.42, negative UDS, negative urinalysis, negative troponin, lipase 21, alcohol level 259, temperature normal, WBCs 3.5, electrolytes and renal function okay  Assessment/Plan: #1 Hypotension: Continue aggressive IV hydration for patient's hypotension, check cortisol level, start stress dose steroids, previous cortisol levels have been low, continue Florinef  #2 ETOH abuse: Continue CIWA protocol. CIWA of 5-6 overnight  #3 Elevated lactate likely in the setting of dehydration, continue aggressive fluid resuscitation  #4.Possible Lung Mets: Noted incidentallConsult oncology , hx of supraglottis , with mets to the lungs. Oncology consulted.   Code Status: Full Family Communication: Pt in room Disposition Plan: Possible d/c in 1-2 days, pending Oncology eval   Consultants:  Oncology  Procedures:    Antibiotics: Anti-infectives    None      HPI/Subjective: No complaints currently  Objective: Filed Vitals:   09/07/15 0649 09/07/15 0930 09/07/15 1200 09/07/15 1340  BP: 173/89 122/70 127/82 126/84  Pulse:  71 64 60  Temp:  98 F (36.7 C)  98.7 F (37.1 C)  TempSrc:  Oral  Oral  Resp:    16  Height:      Weight:      SpO2:  99%  100%    Intake/Output Summary (Last 24 hours) at 09/07/15 1457 Last data filed at 09/07/15 1015  Gross per 24 hour  Intake 1618.33 ml  Output    551 ml  Net 1067.33 ml   Filed Weights   09/05/15 2115  Weight: 63.504 kg (140 lb)     Exam:   General:  Awake, in nad  Cardiovascular: regular, s1, s2  Respiratory: normal resp effort, no wheezing  Abdomen: soft,nondistended  Musculoskeletal: perfused, no clubbing   Data Reviewed: Basic Metabolic Panel:  Recent Labs Lab 09/05/15 2317 09/06/15 0500 09/07/15 0430  NA 141 142 139  K 3.9 4.1 3.9  CL 101 107 106  CO2 26 23 26   GLUCOSE 81 78 114*  BUN <5* <5* 8  CREATININE 0.76 0.57* 0.64  CALCIUM 9.0 8.6* 8.8*  MG  --  1.8  --    Liver Function Tests:  Recent Labs Lab 09/05/15 2317 09/07/15 0430  AST 29 22  ALT 11* 10*  ALKPHOS 127* 123  BILITOT 0.5 0.6  PROT 7.1 6.1*  ALBUMIN 3.8 3.2*    Recent Labs Lab 09/06/15 0156  LIPASE 21   No results for input(s): AMMONIA in the last 168 hours. CBC:  Recent Labs Lab 09/05/15 2317 09/06/15 0500 09/07/15 0430  WBC 3.5* 3.0* 2.7*  NEUTROABS 1.6*  --   --   HGB 12.7* 12.4* 12.2*  HCT 38.2* 37.8* 38.7*  MCV 97.4 98.4 100.0  PLT 203 203 172   Cardiac Enzymes:  Recent Labs Lab 09/06/15 0018  TROPONINI <0.03   BNP (last 3 results) No results for input(s): BNP in the last 8760 hours.  ProBNP (last 3 results) No results for input(s): PROBNP in the last 8760 hours.  CBG:  Recent Labs Lab 09/06/15 0911 09/07/15 0727  GLUCAP 82 114*    Recent Results (from the past 240 hour(s))  Culture, blood (Routine X 2) w Reflex to ID Panel     Status: None (Preliminary result)   Collection Time: 09/06/15  1:57 AM  Result Value Ref Range Status   Specimen Description BLOOD LEFT HAND  Final   Special Requests BOTTLES DRAWN AEROBIC AND ANAEROBIC 5CC  Final   Culture   Final    NO GROWTH 1 DAY Performed at [facility]    Report Status PENDING  Incomplete  Culture, blood (Routine X 2) w Reflex to ID Panel     Status: None (Preliminary result)   Collection Time: 09/06/15  2:15 AM  Result Value Ref Range Status   Specimen Description BLOOD RIGHT HAND  Final   Special Requests  BOTTLES DRAWN AEROBIC AND ANAEROBIC 5CC  Final   Culture   Final    NO GROWTH 1 DAY Performed at [facility]    Report Status PENDING  Incomplete  MRSA PCR Screening     Status: None   Collection Time: 09/06/15  5:19 PM  Result Value Ref Range Status   MRSA by PCR NEGATIVE NEGATIVE Final    Comment:        The GeneXpert MRSA Assay (FDA approved for NASAL specimens only), is one component of a comprehensive MRSA colonization surveillance program. It is not intended to diagnose MRSA infection nor to guide or monitor treatment for MRSA infections.      Studies: Dg Chest 2 View  09/06/2015  CLINICAL DATA:  History of throat carcinoma. Currently with shortness of breath and weakness. Hypotension. EXAM: CHEST  2 VIEW COMPARISON:  March 06, 2015 FINDINGS: Lungs are somewhat hyperexpanded. There is a 1 cm nodular opacity in the left upper lobe peripherally, not present previously. There is a 1.3 x 1.0 cm nodular opacity in the right perihilar region, not present previously. There is a 7 mm nodular opacity in the periphery the right mid lung, not present previously. There is no edema or consolidation. A small metallic foreign body in the left upper lobe region is stable. Heart size is normal. Pulmonary vascularity is within normal limits. No adenopathy is apparent. Port-A-Cath tip is in the superior vena cava. No pneumothorax. No bone lesions. IMPRESSION: Nodular opacities bilaterally appear new compared to prior study and are concerning for small metastatic foci. No edema or consolidation. Lungs are somewhat hyperexpanded. No adenopathy is apparent by radiography. Electronically Signed   By: Lowella Grip III M.D.   On: 09/06/2015 10:16    Scheduled Meds: . citalopram  20 mg Oral Daily  . enoxaparin (LOVENOX) injection  40 mg Subcutaneous Q24H  . fentaNYL  50 mcg Transdermal Q72H  . fludrocortisone  0.1 mg Oral Daily  . folic acid  1 mg Oral Daily  . hydrocortisone sod succinate  (SOLU-CORTEF) inj  50 mg Intravenous Q12H  . LORazepam  0-4 mg Intravenous Q6H   Followed by  . [START ON 09/08/2015] LORazepam  0-4 mg Intravenous Q12H  . LORazepam  0.5 mg Oral TID  . multivitamin with minerals  1 tablet Oral Daily  . pantoprazole  40 mg Oral Daily  . pravastatin  20 mg Oral Daily  . sodium chloride  3 mL Intravenous Q12H  . sucralfate  1 g Oral TID WC & HS  . thiamine  100 mg Oral Daily   Continuous Infusions: . 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 If 7PM-7AM, please contact night-coverage at [email or web address], password [facility] 09/07/2015, 2:57 PM  LOS: 1 day

## 2015-09-08 DIAGNOSIS — C7800 Secondary malignant neoplasm of unspecified lung: Secondary | ICD-10-CM

## 2015-09-08 LAB — GLUCOSE, CAPILLARY: Glucose-Capillary: 97 mg/dL (ref 65–99)

## 2015-09-08 MED ORDER — FLUDROCORTISONE ACETATE 0.1 MG PO TABS: 0.1000 mg | ORAL_TABLET | Freq: Every day | ORAL | Status: DC

## 2015-09-08 NOTE — Progress Notes (Addendum)
Patient HR dropped to 35-40 while asleep. And when RN tried to wake up the patient HR jump to 50-65. N.P. Informed. No New orders. Will continue to monitor.

## 2015-09-08 NOTE — Progress Notes (Signed)
Escorted pt. Downstairs for discharge and he had stated he had called his sister to pick him up. No sister showed up and states he will catch bus, walked him to bus stop , stopped in lobby and used ATM. Has paerwork for [facility] in his belonging bag.

## 2015-09-08 NOTE — Progress Notes (Signed)
Report called to [facility] and talked with nurse tracey RN

## 2015-09-08 NOTE — Progress Notes (Signed)
Patient is set to discharge to [facility] today. Patient aware. Discharge packet given to RN, Manuela Schwartz. Patient states that his sister will transport him to SNF.     Raynaldo Opitz, [facility] Clinical Social Worker cell #: [phone]

## 2015-09-08 NOTE — Discharge Summary (Signed)
Physician Discharge Summary  Marvin [name] [id number] DOB: 03-22-1976 DOA: 09/05/2015  PCP: No PCP Per Patient  Admit date: 09/05/2015 Discharge date: 09/08/2015  Time spent: 20 minutes  Recommendations for Outpatient Follow-up:  1. Follow up with PCP in 2-3 weeks 2. 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 Briefly, 40 y.o. male with PMH of subglottis throat cancer (s/p of surgery, chemotherapy and radiation therapy), orthostatic hypotension, sp of PEG tube, hyperlipidemia, COPD, GERD, alcohol abuse, seizure, who presents with hypotension.In ED, patient was found to have hypotension with blood pressure 79/58, which improved to 94/67 after IV fluids, lactate is 3.42, negative UDS, negative urinalysis, negative troponin, lipase 21, alcohol level 259, temperature normal, WBCs 3.5, electrolytes and renal function okay  Hospital Course:  #1 Hypotension: Patient was continued on aggressive IV hydration for patient's hypotension,cortisol level was normal, patient was given stress dose steroids this admission, previous cortisol levels have been low, continued Florinef  #2 ETOH abuse: Continue CIWA protocol. Last ETOH in take reportedly was 3-4 days prior to admission per patient  #3 Elevated lactate  likely in the setting of dehydration, continued aggressive fluid resuscitation  #4.New Lung Mets: Noted incidentally on CXR and confirmed with chest CT. Oncology was consulted. Patient has a hx of throat cancer. Port in place   Consultations:  Oncology  Discharge Exam: Filed Vitals:   09/07/15 1340 09/07/15 1747 09/07/15 2009 09/08/15 0437  BP: 126/84 127/83 134/82 125/74  Pulse: 60 64 57 63  Temp: 98.7 F (37.1 C)  98.6 F (37 C) 98.8 F (37.1 C)  TempSrc: Oral  Oral Oral  Resp: 16  16 17   Height:      Weight:      SpO2: 100%  100% 100%    General: Awake, in nad Cardiovascular: regular, s1, s2 Respiratory: normal resp effort, no wheezing  Discharge Instructions     Medication List    ASK your doctor about these medications        bisacodyl 10 MG suppository  Commonly known as:  DULCOLAX  Place 10 mg rectally as needed for moderate constipation.     citalopram 20 MG tablet  Commonly known as:  CELEXA  Take 20 mg by mouth daily.     feeding supplement (ENSURE) Pudg  Take 1 Container by mouth 3 (three) times daily between meals.     fentaNYL 50 MCG/HR  Commonly known as:  DURAGESIC - dosed mcg/hr  Apply one patch topically every 72 hours for pain. Remove old patch. External use only.     fludrocortisone 0.1 MG tablet  Commonly known as:  FLORINEF  Take 2 tablets (0.2 mg total) by mouth daily.     folic acid 1 MG tablet  Commonly known as:  FOLVITE  Take  1 tablet (1 mg total) by mouth daily.     HYDROmorphone 4 MG tablet  Commonly known as:  DILAUDID  Take 1 tablets 30 minutes before meals, up to maximum 6 tablets per day if needed     ibuprofen 100 MG/5ML suspension  Commonly known as:  ADVIL,MOTRIN  Take 200-400 mg by mouth every 4 (four) hours as needed for mild pain.     lidocaine 2 % solution  Commonly known as:  XYLOCAINE  Mix 1 part 2%viscous lidocaine,1part H2O.Swish and/or swallow 47ml of this mixture,10min before meals and at bedtime, up to  QID     lidocaine-prilocaine cream  Commonly known as:  EMLA  Apply 1 application topically as needed (FOR PORT ACCESS).     LORazepam 1 MG tablet  Commonly known as:  ATIVAN  Take 0.5 mg by mouth 3 (three) times daily.     magnesium hydroxide 400 MG/5ML suspension  Commonly known as:  MILK OF MAGNESIA  Take 30 mLs by mouth daily as needed for mild constipation.     morphine 20 MG/ML concentrated solution  Commonly known as:  ROXANOL  Take 1 mL (20 mg total) by mouth every 2 (two) hours as needed for moderate pain or severe pain.     multivitamin with minerals Tabs tablet  Take 1 tablet by mouth daily.     omeprazole 20 MG capsule  Commonly known as:  PRILOSEC  Take 20 mg by mouth daily.     ondansetron 8 MG tablet  Commonly known as:  ZOFRAN  8 mg by PEG Tube route every 8 (eight) hours as needed for nausea or vomiting. Reported on 08/17/2015     potassium chloride SA 20 MEQ tablet  Commonly known as:  K-DUR,KLOR-CON  Take 20 mEq by mouth 3 (three) times daily.     pravastatin 20 MG tablet  Commonly known as:  PRAVACHOL  Take 20 mg by mouth daily.     prochlorperazine 10 MG tablet  Commonly known as:  COMPAZINE  10 mg by PEG Tube route every 6 (six) hours as needed for nausea or vomiting. Reported on 08/17/2015     sodium fluoride 1.1 % Crea dental cream  Commonly known as:  PREVIDENT 5000 PLUS  Apply thin ribbon of cream to tooth brush. Brush teeth for 2 minutes. Spit out excess-DO NOT swallow. DO NOT rinse afterwards. Repeat nightly.     sodium phosphate enema  Commonly known as:  FLEET  Place 1 enema rectally once as needed (for constipation). follow package directions     sucralfate 1 g tablet  Commonly known as:  CARAFATE  Dissolve 1 tablet in 54ml H2O and swallow up to QID,PRN sore throat.     thiamine 100 MG tablet  Take 1 tablet (100 mg total) by mouth daily.       No Known Allergies    The results of significant diagnostics from this  hospitalization (including imaging, microbiology, ancillary and laboratory) are listed below for reference.    Significant Diagnostic Studies: Dg Chest 2 View  09/06/2015  CLINICAL DATA:  History of throat carcinoma. Currently with shortness of breath and weakness. Hypotension. EXAM: CHEST  2 VIEW COMPARISON:  March 06, 2015 FINDINGS: Lungs are somewhat hyperexpanded. There is a 1 cm nodular opacity in the left upper lobe peripherally, not present previously. There is a 1.3 x 1.0 cm nodular opacity in the right perihilar region, not present previously. There is a 7 mm nodular opacity in the  periphery the right mid lung, not present previously. There is no edema or consolidation. A small metallic foreign body in the left upper lobe region is stable. Heart size is normal. Pulmonary vascularity is within normal limits. No adenopathy is apparent. Port-A-Cath tip is in the superior vena cava. No pneumothorax. No bone lesions. IMPRESSION: Nodular opacities bilaterally appear new compared to prior study and are concerning for small metastatic foci. No edema or consolidation. Lungs are somewhat hyperexpanded. No adenopathy is apparent by radiography. Electronically Signed   By: Lowella Grip III M.D.   On: 09/06/2015 10:16   Ct Chest W Contrast  09/07/2015  CLINICAL DATA:  Supraglottic carcinoma. Subsequent treatment evaluation. EXAM: CT CHEST WITH CONTRAST TECHNIQUE: Multidetector CT imaging of the chest was performed during intravenous contrast administration. CONTRAST:  62mL OMNIPAQUE IOHEXOL 300 MG/ML  SOLN COMPARISON:  PET-CT 02/17/2015 FINDINGS: Mediastinum/Nodes: No axillary or supraclavicular lymphadenopathy. No mediastinal or hilar adenopathy. RIGHT hilar lymph node measures 8 mm. No central pulmonary embolism. No pericardial fluid. Esophagus normal. Lungs/Pleura: New LEFT upper lobe pulmonary nodule measures 8 mm (image 18, series 5). There is interval clearing of the atelectasis and airspace disease in  the LEFT lower lobe. Subpleural nodule in the LEFT lower lobe measures 4 mm on image 43. More centrally within LEFT lower lobe nodule adjacent to the descending thoracic aorta measures 15 mm. This region was covered by atelectasis on comparison exam. Along the medial aspect of the RIGHT upper lobe, irregular 13 mm nodule abutting pleural surface (image 22, series 5) is new prior. Peripheral nodule in the RIGHT upper lobe measuring 6 mm along the pleural surface on image 29, series 5) is new. Nodule within the RIGHT middle lobe against the pleural surface measuring 11 mm (image 38, series 5). Interval clearing of the RIGHT lower lobe nodular airspace opacity. There are several RIGHT lower lobe pulmonary nodules additionally. Upper abdomen: Percutaneous gastrostomy tube in stomach. Adrenal glands are normal. No focal hepatic lesion Musculoskeletal: No aggressive osseous lesion. IMPRESSION: 1. New bilateral pulmonary nodules is most consistent with pulmonary metastatic disease. 2. Interval clearing of bibasilar airspace disease and atelectasis consistent with resolved inflammatory/infectious process. 3. No lymphadenopathy. Electronically Signed   By: Suzy Bouchard M.D.   On: 09/07/2015 16:24    Microbiology: Recent Results (from the past 240 hour(s))  Culture, blood (Routine X 2) w Reflex to ID Panel     Status: None (Preliminary result)   Collection Time: 09/06/15  1:57 AM  Result Value Ref Range Status   Specimen Description BLOOD LEFT HAND  Final   Special Requests BOTTLES DRAWN AEROBIC AND ANAEROBIC 5CC  Final   Culture   Final    NO GROWTH 1 DAY Performed at [facility]    Report Status PENDING  Incomplete  Culture, blood (Routine X 2) w Reflex to ID Panel     Status: None (Preliminary result)   Collection Time: 09/06/15  2:15 AM  Result Value Ref Range Status   Specimen Description BLOOD RIGHT HAND  Final   Special Requests BOTTLES DRAWN AEROBIC AND ANAEROBIC 5CC  Final   Culture    Final    NO GROWTH 1 DAY Performed at [facility]    Report Status PENDING  Incomplete  MRSA PCR Screening     Status: None   Collection Time: 09/06/15  5:19 PM  Result Value Ref Range Status   MRSA by PCR NEGATIVE NEGATIVE Final    Comment:  The GeneXpert MRSA Assay (FDA approved for NASAL specimens only), is one component of a comprehensive MRSA colonization surveillance program. It is not intended to diagnose MRSA infection nor to guide or monitor treatment for MRSA infections.      Labs: Basic Metabolic Panel:  Recent Labs Lab 09/05/15 2317 09/06/15 0500 09/07/15 0430  NA 141 142 139  K 3.9 4.1 3.9  CL 101 107 106  CO2 26 23 26   GLUCOSE 81 78 114*  BUN <5* <5* 8  CREATININE 0.76 0.57* 0.64  CALCIUM 9.0 8.6* 8.8*  MG  --  1.8  --    Liver Function Tests:  Recent Labs Lab 09/05/15 2317 09/07/15 0430  AST 29 22  ALT 11* 10*  ALKPHOS 127* 123  BILITOT 0.5 0.6  PROT 7.1 6.1*  ALBUMIN 3.8 3.2*    Recent Labs Lab 09/06/15 0156  LIPASE 21   No results for input(s): AMMONIA in the last 168 hours. CBC:  Recent Labs Lab 09/05/15 2317 09/06/15 0500 09/07/15 0430  WBC 3.5* 3.0* 2.7*  NEUTROABS 1.6*  --   --   HGB 12.7* 12.4* 12.2*  HCT 38.2* 37.8* 38.7*  MCV 97.4 98.4 100.0  PLT 203 203 172   Cardiac Enzymes:  Recent Labs Lab 09/06/15 0018  TROPONINI <0.03   BNP: BNP (last 3 results) No results for input(s): BNP in the last 8760 hours.  ProBNP (last 3 results) No results for input(s): PROBNP in the last 8760 hours.  CBG:  Recent Labs Lab 09/06/15 0911 09/07/15 0727 09/08/15 0737  GLUCAP 82 114* 97     Signed:  Telesa Jeancharles K  [facility] 09/08/2015, 11:47 AM

## 2015-09-10 ENCOUNTER — Encounter: Payer: Self-pay | Admitting: *Deleted

## 2015-09-10 ENCOUNTER — Telehealth: Payer: Self-pay | Admitting: *Deleted

## 2015-09-10 ENCOUNTER — Ambulatory Visit (HOSPITAL_BASED_OUTPATIENT_CLINIC_OR_DEPARTMENT_OTHER): Payer: Medicaid Other

## 2015-09-10 ENCOUNTER — Ambulatory Visit (HOSPITAL_BASED_OUTPATIENT_CLINIC_OR_DEPARTMENT_OTHER): Payer: Medicaid Other | Admitting: Hematology and Oncology

## 2015-09-10 VITALS — BP 95/61 | HR 80 | Temp 97.5°F | Resp 18 | Wt 139.3 lb

## 2015-09-10 DIAGNOSIS — F411 Generalized anxiety disorder: Secondary | ICD-10-CM

## 2015-09-10 DIAGNOSIS — C321 Malignant neoplasm of supraglottis: Secondary | ICD-10-CM

## 2015-09-10 DIAGNOSIS — E43 Unspecified severe protein-calorie malnutrition: Secondary | ICD-10-CM | POA: Diagnosis not present

## 2015-09-10 DIAGNOSIS — E860 Dehydration: Secondary | ICD-10-CM

## 2015-09-10 DIAGNOSIS — Z720 Tobacco use: Secondary | ICD-10-CM

## 2015-09-10 DIAGNOSIS — F1010 Alcohol abuse, uncomplicated: Secondary | ICD-10-CM

## 2015-09-10 DIAGNOSIS — D61818 Other pancytopenia: Secondary | ICD-10-CM | POA: Diagnosis not present

## 2015-09-10 DIAGNOSIS — C7800 Secondary malignant neoplasm of unspecified lung: Secondary | ICD-10-CM | POA: Diagnosis not present

## 2015-09-10 DIAGNOSIS — I951 Orthostatic hypotension: Secondary | ICD-10-CM

## 2015-09-10 MED ORDER — LORAZEPAM 1 MG PO TABS
ORAL_TABLET | ORAL | Status: AC
  Filled 2015-09-10: qty 1

## 2015-09-10 MED ORDER — SODIUM CHLORIDE 0.9 % IJ SOLN
10.0000 mL | INTRAMUSCULAR | Status: DC | PRN
  Administered 2015-09-10: 10 mL
  Filled 2015-09-10: qty 10

## 2015-09-10 MED ORDER — LORAZEPAM 1 MG PO TABS
0.5000 mg | ORAL_TABLET | Freq: Once | ORAL | Status: AC
  Administered 2015-09-10: 0.5 mg via ORAL

## 2015-09-10 MED ORDER — SODIUM CHLORIDE 0.9 % IV SOLN
Freq: Once | INTRAVENOUS | Status: AC
  Administered 2015-09-10: 15:00:00 via INTRAVENOUS

## 2015-09-10 MED ORDER — FLUDROCORTISONE ACETATE 0.1 MG PO TABS: 0.2000 mg | ORAL_TABLET | Freq: Every day | ORAL | Status: DC

## 2015-09-10 MED ORDER — HEPARIN SOD (PORK) LOCK FLUSH 100 UNIT/ML IV SOLN
500.0000 [IU] | Freq: Once | INTRAVENOUS | Status: AC | PRN
  Administered 2015-09-10: 500 [IU]
  Filled 2015-09-10: qty 5

## 2015-09-10 MED ORDER — LORAZEPAM 0.5 MG PO TABS: 0.5000 mg | ORAL_TABLET | Freq: Two times a day (BID) | ORAL | Status: DC

## 2015-09-10 NOTE — Progress Notes (Signed)
[facility] OFFICE PROGRESS NOTE  Patient Care Team: No Pcp Per Patient as PCP - General (General Practice) [name], MD as Consulting Physician (Otolaryngology) [name], MD as Attending Physician (Radiation Oncology) [name], MD as Consulting Physician (Hematology and Oncology) Leota Sauers, RN as Oncology Nurse Navigator Lenn Cal, DDS as Consulting Physician (Dentistry)  SUMMARY OF ONCOLOGIC HISTORY: Oncology History   Carcinoma of supraglottis   Staging form: Larynx - Supraglottis, AJCC 7th Edition     Clinical stage from 03/13/2015: Stage IVA (T3, N2b, M0) - Signed by [name], MD on 03/13/2015     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[place])   01/22/2015 Imaging CT scan showed enhancing infiltrative mass centered at the right piriform sinus with bilateral neck lymphadenopathy   02/22/2015 - 03/08/2015 Hospital Admission He was admitted to the hospital for management of advanced supraglottic cancer status post tracheostomy, biopsy, dental extraction, placement of feeding tube and subsequent discharge to skilled nursing facility   02/22/2015 Pathology Results Accession: [id number] 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   03/09/2015 PET scan 1)  Right piriform sinus mass with right level 2 nodal metastasis.  2)  Hypermetabolic thoracic nodes which are at least partially felt to be reactive.  3)  No evidence of subdiaphragmatic metastatic disease.    03/16/2015 Procedure He has placement of port   03/21/2015 - 05/04/2015 Chemotherapy He received high dose cisplatin x 3 cycles   03/21/2015 - 05/09/2015 Radiation Therapy Tomotherapy to supraglottic tumor and bilateral neck:  70 Gy in 35 fractions to gross disease, 63 Gy in 35 fractions to high risk nodal echelons, and 56 Gy in 35 fractions to intermediate risk nodal echelons.   05/09/2015 Miscellaneous He went  to the ED with fall   06/20/2015 Procedure Tracheostomy was removed   09/06/2015 - 09/08/2015 Hospital Admission  he was admitted to the hospital with alcohol intoxication, dehydration and pain. CT scan of the chest show bilateral metastatic disease to the lung    INTERVAL HISTORY: Please see below for problem oriented charting. The patient returns today to review test results of CT scan from last week. He appears intoxicated in the office. History is deemed unreliable. He admits he is drinking because of anxiety. He denies depression or suicidal ideation. He cannot tell me how much he drinks or smoke. He is currently living with a friend. He left the skilled nursing facility recently on his own accord.  I spoke with his sister extensively. According to his sister, he had been drinking since November after tracheostomy was removed.  REVIEW OF SYSTEMS:  UNRELIABLE  I have reviewed the past medical history, past surgical history, social history and family history with the patient and they are unchanged from previous note.  ALLERGIES:  has No Known Allergies.  MEDICATIONS:  Current Outpatient Prescriptions  Medication Sig Dispense Refill  . LORazepam (ATIVAN) 0.5 MG tablet Take 1 tablet (0.5 mg total) by mouth 2 (two) times daily. 14 tablet 0  . thiamine 100 MG tablet Take 1 tablet (100 mg total) by mouth daily. 30 tablet 0   No current facility-administered medications for this visit.   Facility-Administered Medications Ordered in Other Visits  Medication Dose Route Frequency Provider Last Rate Last Dose  . 0.9 %  sodium chloride infusion   Intravenous Once [name], MD        PHYSICAL EXAMINATION: ECOG PERFORMANCE  STATUS: 2 - Symptomatic, <50% confined to bed  Filed Vitals:   09/10/15 1334  BP: 95/61  Pulse: 80  Temp: 97.5 F (36.4 C)  Resp: 18   Filed Weights   09/10/15 1334  Weight: 139 lb 4.8 oz (63.186 kg)    GENERAL:alert, no distress and comfortable.  He  appears intoxicated SKIN: skin color, texture, turgor are normal, no rashes or significant lesions EYES: normal, Conjunctiva are pink and non-injected, sclera clear OROPHARYNX:no exudate, no erythema and lips, buccal mucosa, and tongue normal  NECK: supple, thyroid normal size, non-tender, without nodularity LYMPH:  no palpable lymphadenopathy in the cervical, axillary or inguinal LUNGS: clear to auscultation and percussion with normal breathing effort HEART: regular rate & rhythm and no murmurs and no lower extremity edema ABDOMEN:abdomen soft, non-tender and normal bowel sounds Musculoskeletal:no cyanosis of digits and no clubbing  NEURO: alert & oriented x 3 with fluent speech, no focal motor/sensory deficits  LABORATORY DATA:  I have reviewed the data as listed    Component Value Date/Time   NA 139 09/07/2015 0430   NA 142 08/30/2015 0836   NA 140 07/19/2015   K 3.9 09/07/2015 0430   K 4.0 08/30/2015 0836   CL 106 09/07/2015 0430   CO2 26 09/07/2015 0430   CO2 30* 08/30/2015 0836   GLUCOSE 114* 09/07/2015 0430   GLUCOSE 87 08/30/2015 0836   BUN 8 09/07/2015 0430   BUN 5.1* 08/30/2015 0836   BUN 10 07/19/2015   CREATININE 0.64 09/07/2015 0430   CREATININE 0.7 08/30/2015 0836   CREATININE 0.65 08/16/2015 1538   CREATININE 0.6 07/19/2015   CALCIUM 8.8* 09/07/2015 0430   CALCIUM 9.0 08/30/2015 0836   PROT 6.1* 09/07/2015 0430   PROT 6.5 08/30/2015 0836   ALBUMIN 3.2* 09/07/2015 0430   ALBUMIN 3.4* 08/30/2015 0836   AST 22 09/07/2015 0430   AST 17 08/30/2015 0836   ALT 10* 09/07/2015 0430   ALT <9 08/30/2015 0836   ALKPHOS 123 09/07/2015 0430   ALKPHOS 107 08/30/2015 0836   BILITOT 0.6 09/07/2015 0430   BILITOT 0.56 08/30/2015 0836   GFRNONAA >60 09/07/2015 0430   GFRAA >60 09/07/2015 0430    No results found for: SPEP, UPEP  Lab Results  Component Value Date   WBC 2.7* 09/07/2015   NEUTROABS 1.6* 09/05/2015   HGB 12.2* 09/07/2015   HCT 38.7* 09/07/2015   MCV  100.0 09/07/2015   PLT 172 09/07/2015      Chemistry      Component Value Date/Time   NA 139 09/07/2015 0430   NA 142 08/30/2015 0836   NA 140 07/19/2015   K 3.9 09/07/2015 0430   K 4.0 08/30/2015 0836   CL 106 09/07/2015 0430   CO2 26 09/07/2015 0430   CO2 30* 08/30/2015 0836   BUN 8 09/07/2015 0430   BUN 5.1* 08/30/2015 0836   BUN 10 07/19/2015   CREATININE 0.64 09/07/2015 0430   CREATININE 0.7 08/30/2015 0836   CREATININE 0.65 08/16/2015 1538   CREATININE 0.6 07/19/2015   GLU 82 07/19/2015      Component Value Date/Time   CALCIUM 8.8* 09/07/2015 0430   CALCIUM 9.0 08/30/2015 0836   ALKPHOS 123 09/07/2015 0430   ALKPHOS 107 08/30/2015 0836   AST 22 09/07/2015 0430   AST 17 08/30/2015 0836   ALT 10* 09/07/2015 0430   ALT <9 08/30/2015 0836   BILITOT 0.6 09/07/2015 0430   BILITOT 0.56 08/30/2015 [id number]  RADIOGRAPHIC STUDIES: I reviewed the imaging study with the patient and discussed the result with the sister I have personally reviewed the radiological images as listed and agreed with the findings in the report.    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 With this change of mental status, it is not clear to me whether he truly understand the gravity of the situation. I spoke with his sister, Freida Busman, extensively over the telephone. With his uncertain social behavior with alcohol abuse and smoking and indeterminate living situation, I do not feel comfortable prescribing palliative chemotherapy for him. Without systemic treatment, his longevity would likely be under 6 months. In the best case scenario, with aggressive chemotherapy, we might be extend his longevity to a year and a half.  I recommend his sister and other caregivers to have a family meeting for the next step.  Pancytopenia, acquired [facility]) The recent pancytopenia seen while hospitalized is likely acquired pancytopenia related to alcohol  abuse. He is not symptomatic. Observe only.  Malignant neoplasm metastatic to lung [facility])  His baseline PET/CT scan show indeterminate lung nodules. The most recent CT scan showed scattered bilateral pulmonary nodules suspicious for cancer spread. There are too small for biopsy. I will review the case at the next ENT tumor board. Currently , he is not symptomatic.  Protein-calorie malnutrition, severe ([place])  He does not have a definitive place to stay and hence unreliable oral intake He has left the skilled nursing facility. He is able to swallow most foods by mouth.  I discussed his living situation with his sister  Anxiety disorder  The patient requested prescription of lorazepam. His stated that he drinks to "calm his nerve". He does not follow with a psychiatrist or primary care doctor. I will try to get social worker to look for helped/assistant to get referral to see psychiatrist/psychologist  Alcohol abuse  The patient admits he is an alcoholic because of anxiety disorder.  he does not attend AA or other support groups.  I will try to get help /assistance to get him enrolled in some sort of program to help him quit drinking  Tobacco abuse I spent some time counseling the patient the importance of tobacco cessation.  Dehydration  He appears clinically dehydrated with hypotension. I will give him IV fluids  Orthostatic hypotension  He has orthostatic hypotension and his cardiologist recommended Florinef 0.2 mg daily. He did not fill his prescription recently because of his living situation. He is not taking his medications reliably. I spoke with his sister about this.   Addendum at 4:00, the patient was evaluated again after IV fluids. Apparently, he pulled out the port needle in the course of intoxication. Family members were contacted and informed to take him home  All questions were answered. The patient knows to call the clinic with any problems, questions or  concerns. No barriers to learning was detected. I spent 40 minutes counseling the patient face to face. The total time spent in the appointment was 60 minutes and more than 50% was on counseling and review of test results     [facility], Lorah Kalina, MD 09/11/2015 10:01 AM

## 2015-09-10 NOTE — Telephone Encounter (Signed)
Message from [facility] [employer] to call pharmacy in reference to early refill of narcotic refill today that is early.  [facility]ed [facility].  Lorazepam ordered today is a week early.  With further investigation the previous order was filled in a nursing home and not actually dispensed to Mr. Arnet.  [facility] will fill today's order as ordered by Dr. Alvy Bimler.

## 2015-09-10 NOTE — Telephone Encounter (Signed)
  Oncology Nurse Navigator Documentation  Navigator Location: [facility] (09/10/15 1029) Navigator Encounter Type: Telephone (09/10/15 1029) Telephone: Appt Confirmation/Clarification (09/10/15 1029)             Barriers/Navigation Needs: Coordination of Care (09/10/15 1029)       Per Dr. Alvy Bimler, called Mr. Spohr to arrange follow-up appt for today or this Wed.    He indicated he can come today at 1:00.  I informed Dr. Alvy Bimler, RN Sharlynn Oliphant, issued Urgent POF.  He stated he returned to [facility] after his hospital DC on Saturday, can arrange transportation for appt.  [name], RN, BSN, [place] at [place] [phone]                       Time Spent with Patient: 15 (09/10/15 1029)

## 2015-09-10 NOTE — Patient Instructions (Signed)

## 2015-09-10 NOTE — Progress Notes (Signed)
Patient agitated and wanting to leave pulled out needle from porta cath at 1435, porta cath accessed and flushed per protocol at 1441. Sister called and left message that patient was wanting to go home. Only received about 500 ml Normal Saline, Sister and Mom to pick up at 1600.

## 2015-09-11 ENCOUNTER — Encounter: Payer: Self-pay | Admitting: Hematology and Oncology

## 2015-09-11 DIAGNOSIS — D61818 Other pancytopenia: Secondary | ICD-10-CM | POA: Insufficient documentation

## 2015-09-11 LAB — CULTURE, BLOOD (ROUTINE X 2)
CULTURE: NO GROWTH
Culture: NO GROWTH

## 2015-09-11 NOTE — Assessment & Plan Note (Signed)
The patient requested prescription of lorazepam. His stated that he drinks to "calm his nerve". He does not follow with a psychiatrist or primary care doctor. I will try to get social worker to look for helped/assistant to get referral to see psychiatrist/psychologist

## 2015-09-11 NOTE — Assessment & Plan Note (Signed)
His baseline PET/CT scan show indeterminate lung nodules. The most recent CT scan showed scattered bilateral pulmonary nodules suspicious for cancer spread. There are too small for biopsy. I will review the case at the next ENT tumor board. Currently , he is not symptomatic.

## 2015-09-11 NOTE — Assessment & Plan Note (Signed)
He does not have a definitive place to stay and hence unreliable oral intake He has left the skilled nursing facility. He is able to swallow most foods by mouth.  I discussed his living situation with his sister

## 2015-09-11 NOTE — Assessment & Plan Note (Signed)
The recent pancytopenia seen while hospitalized is likely acquired pancytopenia related to alcohol abuse. He is not symptomatic. Observe only.

## 2015-09-11 NOTE — Assessment & Plan Note (Signed)
The patient admits he is an alcoholic because of anxiety disorder.  he does not attend AA or other support groups.  I will try to get help /assistance to get him enrolled in some sort of program to help him quit drinking

## 2015-09-11 NOTE — Assessment & Plan Note (Signed)
He has orthostatic hypotension and his cardiologist recommended Florinef 0.2 mg daily. He did not fill his prescription recently because of his living situation. He is not taking his medications reliably. I spoke with his sister about this.

## 2015-09-11 NOTE — Assessment & Plan Note (Signed)
I reviewed the imaging study with the patient. With this change of mental status, it is not clear to me whether he truly understand the gravity of the situation. I spoke with his sister, Freida Busman, extensively over the telephone. With his uncertain social behavior with alcohol abuse and smoking and indeterminate living situation, I do not feel comfortable prescribing palliative chemotherapy for him. Without systemic treatment, his longevity would likely be under 6 months. In the best case scenario, with aggressive chemotherapy, we might be extend his longevity to a year and a half.  I recommend his sister and other caregivers to have a family meeting for the next step.

## 2015-09-11 NOTE — Assessment & Plan Note (Signed)
He appears clinically dehydrated with hypotension. I will give him IV fluids

## 2015-09-11 NOTE — Assessment & Plan Note (Signed)
I spent some time counseling the patient the importance of tobacco cessation. 

## 2015-09-12 ENCOUNTER — Emergency Department (HOSPITAL_COMMUNITY)
Admission: EM | Admit: 2015-09-12 | Discharge: 2015-09-14 | Disposition: A | Discharge status: Rehabilitation Facility | Payer: Medicaid Other | Attending: Emergency Medicine | Admitting: Emergency Medicine

## 2015-09-12 ENCOUNTER — Encounter (HOSPITAL_COMMUNITY): Payer: Self-pay | Admitting: Emergency Medicine

## 2015-09-12 DIAGNOSIS — R45851 Suicidal ideations: Secondary | ICD-10-CM

## 2015-09-12 DIAGNOSIS — F419 Anxiety disorder, unspecified: Secondary | ICD-10-CM | POA: Diagnosis not present

## 2015-09-12 DIAGNOSIS — F10920 Alcohol use, unspecified with intoxication, uncomplicated: Secondary | ICD-10-CM

## 2015-09-12 DIAGNOSIS — F322 Major depressive disorder, single episode, severe without psychotic features: Secondary | ICD-10-CM | POA: Diagnosis not present

## 2015-09-12 DIAGNOSIS — Z046 Encounter for general psychiatric examination, requested by authority: Secondary | ICD-10-CM | POA: Diagnosis present

## 2015-09-12 DIAGNOSIS — F17210 Nicotine dependence, cigarettes, uncomplicated: Secondary | ICD-10-CM | POA: Diagnosis not present

## 2015-09-12 DIAGNOSIS — Z931 Gastrostomy status: Secondary | ICD-10-CM | POA: Diagnosis not present

## 2015-09-12 DIAGNOSIS — F1310 Sedative, hypnotic or anxiolytic abuse, uncomplicated: Secondary | ICD-10-CM | POA: Diagnosis not present

## 2015-09-12 DIAGNOSIS — F10229 Alcohol dependence with intoxication, unspecified: Secondary | ICD-10-CM | POA: Diagnosis not present

## 2015-09-12 DIAGNOSIS — F1020 Alcohol dependence, uncomplicated: Secondary | ICD-10-CM

## 2015-09-12 DIAGNOSIS — Z862 Personal history of diseases of the blood and blood-forming organs and certain disorders involving the immune mechanism: Secondary | ICD-10-CM | POA: Diagnosis not present

## 2015-09-12 DIAGNOSIS — Z8719 Personal history of other diseases of the digestive system: Secondary | ICD-10-CM | POA: Diagnosis not present

## 2015-09-12 DIAGNOSIS — F1110 Opioid abuse, uncomplicated: Secondary | ICD-10-CM | POA: Diagnosis not present

## 2015-09-12 DIAGNOSIS — E876 Hypokalemia: Secondary | ICD-10-CM

## 2015-09-12 DIAGNOSIS — J449 Chronic obstructive pulmonary disease, unspecified: Secondary | ICD-10-CM | POA: Diagnosis not present

## 2015-09-12 DIAGNOSIS — Z79899 Other long term (current) drug therapy: Secondary | ICD-10-CM | POA: Diagnosis not present

## 2015-09-12 DIAGNOSIS — Z7952 Long term (current) use of systemic steroids: Secondary | ICD-10-CM | POA: Insufficient documentation

## 2015-09-12 DIAGNOSIS — Z85819 Personal history of malignant neoplasm of unspecified site of lip, oral cavity, and pharynx: Secondary | ICD-10-CM | POA: Diagnosis not present

## 2015-09-12 HISTORY — DX: Major depressive disorder, single episode, severe without psychotic features: F32.2

## 2015-09-12 LAB — COMPREHENSIVE METABOLIC PANEL
ALBUMIN: 3.7 g/dL (ref 3.5–5.0)
ALK PHOS: 133 U/L — AB (ref 38–126)
ALT: 13 U/L — ABNORMAL LOW (ref 17–63)
ANION GAP: 10 (ref 5–15)
AST: 31 U/L (ref 15–41)
BUN: <5 mg/dL — ABNORMAL LOW (ref 6–20)
CALCIUM: 8.2 mg/dL — AB (ref 8.9–10.3)
CO2: 30 mmol/L (ref 22–32)
Chloride: 103 mmol/L (ref 101–111)
Creatinine, Ser: 0.56 mg/dL — ABNORMAL LOW (ref 0.61–1.24)
GFR calc non Af Amer: >60 mL/min (ref >60)
GLUCOSE: 102 mg/dL — AB (ref 65–99)
POTASSIUM: 2.9 mmol/L — AB (ref 3.5–5.1)
SODIUM: 143 mmol/L (ref 135–145)
Total Bilirubin: 0.4 mg/dL (ref 0.3–1.2)
Total Protein: 6.8 g/dL (ref 6.5–8.1)

## 2015-09-12 LAB — CBC
HCT: 40.7 % (ref 39.0–52.0)
HEMOGLOBIN: 13.6 g/dL (ref 13.0–17.0)
MCH: 32.9 pg (ref 26.0–34.0)
MCHC: 33.4 g/dL (ref 30.0–36.0)
MCV: 98.5 fL (ref 78.0–100.0)
Platelets: 155 10*3/uL (ref 150–400)
RBC: 4.13 MIL/uL — ABNORMAL LOW (ref 4.22–5.81)
RDW: 17.3 % — AB (ref 11.5–15.5)
WBC: 3.9 10*3/uL — AB (ref 4.0–10.5)

## 2015-09-12 LAB — ACETAMINOPHEN LEVEL

## 2015-09-12 LAB — SALICYLATE LEVEL

## 2015-09-12 LAB — ETHANOL: Alcohol, Ethyl (B): 364 mg/dL (ref <5)

## 2015-09-12 MED ORDER — LORAZEPAM 1 MG PO TABS
0.0000 mg | ORAL_TABLET | Freq: Two times a day (BID) | ORAL | Status: DC
  Administered 2015-09-14: 1 mg via ORAL
  Filled 2015-09-12: qty 2

## 2015-09-12 MED ORDER — LORAZEPAM 1 MG PO TABS
0.0000 mg | ORAL_TABLET | Freq: Four times a day (QID) | ORAL | Status: AC
  Administered 2015-09-12: 2 mg via ORAL
  Administered 2015-09-13: 1 mg via ORAL
  Administered 2015-09-13: 4 mg via ORAL
  Administered 2015-09-14: 2 mg via ORAL
  Administered 2015-09-14: 1 mg via ORAL
  Filled 2015-09-12: qty 2
  Filled 2015-09-12 (×2): qty 1
  Filled 2015-09-12 (×2): qty 4
  Filled 2015-09-12: qty 2

## 2015-09-12 MED ORDER — ZOLPIDEM TARTRATE 10 MG PO TABS
10.0000 mg | ORAL_TABLET | Freq: Every evening | ORAL | Status: DC | PRN
  Administered 2015-09-12: 10 mg via ORAL
  Filled 2015-09-12: qty 1

## 2015-09-12 MED ORDER — HALOPERIDOL LACTATE 5 MG/ML IJ SOLN
5.0000 mg | Freq: Once | INTRAMUSCULAR | Status: AC
  Administered 2015-09-12: 5 mg via INTRAMUSCULAR
  Filled 2015-09-12: qty 1

## 2015-09-12 MED ORDER — POTASSIUM CHLORIDE CRYS ER 20 MEQ PO TBCR
40.0000 meq | EXTENDED_RELEASE_TABLET | Freq: Once | ORAL | Status: AC
  Administered 2015-09-12: 40 meq via ORAL
  Filled 2015-09-12: qty 2

## 2015-09-12 MED ORDER — ONDANSETRON 8 MG PO TBDP
8.0000 mg | ORAL_TABLET | Freq: Three times a day (TID) | ORAL | Status: DC | PRN
  Administered 2015-09-12 – 2015-09-13 (×2): 8 mg via ORAL
  Filled 2015-09-12 (×3): qty 1

## 2015-09-12 NOTE — BH Assessment (Addendum)
Tele Assessment Note   Marvin [name] is an 40 y.o. male.  -Clinician reviewed note by Montine Circle, PA.  Patient is on IVC because of his thoughts of suicide and drinking.  Patient has been told by oncologist that he will not treat him if he keeps coming to appointments intoxicated.  Pt told PA that he was having SI but no plan.  Patient was diagnosed with lung cancer about two weeks ago.  He started drinking again when he found out he had the lung cancer.  Pt says that he had throat cancer before and was cleared of it in January of 2016.  He stopped drinking ETOH for about 6 months and started back two weeks ago after the lung cancer dx.  He reports that he has surgery scheduled with the [facility] on 09/14/15.    Patient admits to drinking 2-3 forty oz beers per day for the last two weeks.  He said that he had been sober for about 6 months prior to hearing the lung cancer diagnosis.  Pt is unsure of how much he drank today.  His BAL was 364 at 16:34 on 01/25.  Patient says he has had seizures before and that he had one today.  Patient says he has no epilepsy or seizure d/o but is unclear about onset.  Patient says that he is depressed about the current health problems.  Patient however denies any current/recurrent SI and denies any thoughts of same for the last 6 months.  Patient also denies any HI or A/V hallucinations.  Patient has no outpatient provider.  He says he has been in rehab centers in the past but cannot give accurate timeline.    According to IVC papers taken out by father, patient has made statements like "I just want to die."  According to papers, patient has been told by oncologist that he cannot treat him if he is showing up for appointments intoxicated.  Patient says he has been staying with his brother recently but cannot return there.  When asked if he has a place to return to he says yes but does not elaborate.  -Clinician discussed patient with [name], PA who recommends that patient be reviewed in AM on 01/26 by psychiatry to uphold or rescind IVC.     Diagnosis: ETOH use d/o severe  Past Medical History:  Past Medical History  Diagnosis Date  . ETOH abuse   . Alcohol dependence ([place]) 04/17/2012  . COPD (chronic obstructive pulmonary disease) ([place])   . Shortness of breath dyspnea     occ  . Anxiety   . GERD (gastroesophageal reflux disease)   . Seizures ([place])     one episode 09/2012, suspected related to ETOH withdrawal  . Anxiety disorder 03/12/2015  . Headache 05/10/2015  . Hypokalemia 05/10/2015  . 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 Cancer ([place])   . Throat cancer [facility])     Past Surgical History  Procedure Laterality Date  . Knee surgery Left     kneecap -screws  . Appendectomy    . Tracheostomy tube placement N/A 02/22/2015    Procedure: TRACHEOSTOMY;  Surgeon: [name], MD;  Location: [place];  Service: ENT;  Laterality: N/A;  awake tracheostomy  . Direct laryngoscopy N/A 02/22/2015    Procedure: DIRECT LARYNGOSCOPY;  Surgeon: [name], MD;  Location: [place];  Service: ENT;  Laterality: N/A;  direct laryngoscopy with biopsy  . Multiple extractions with alveoloplasty N/A 03/05/2015  Procedure: Extraction of tooth #'s 1,2,8,9,15,16,17,18,31,32 with alveoloplasty, mandibular right lateral exostoses, and gross debridement of remaining teeth;  Surgeon: Lenn Cal, DDS;  Location: [place];  Service: Oral Surgery;  Laterality: N/A;    Family History:  Family History  Problem Relation Age of Onset  . Cancer Mother   . Heart attack Maternal Grandmother     age of death 40  . Heart attack Maternal Grandfather   . Cancer Paternal Grandfather     Social History:  reports that he has been smoking Cigarettes.  He has a 28 pack-year smoking history. He has never used smokeless tobacco. He reports that he drinks alcohol. He reports that he does not use illicit drugs.  Additional Social History:  Alcohol / Drug  Use Pain Medications: See PTA medication list Prescriptions: See PTA medication list Over the Counter: See PTA medication list History of alcohol / drug use?: Yes Longest period of sobriety (when/how long): 6 months sobriety in 2016 Negative Consequences of Use: Personal relationships Withdrawal Symptoms: Agitation, Diarrhea, Fever / Chills, Nausea / Vomiting, Patient aware of relationship between substance abuse and physical/medical complications, Weakness, Tremors, Seizures Onset of Seizures: Onset unclear Date of most recent seizure: "I had one earlier this morning." Substance #1 Name of Substance 1: ETOH (beer) 1 - Age of First Use: 40 years of age 82 - Amount (size/oz): 2-3 forties per day 1 - Frequency: Daily 1 - Duration: Last couple of weeks after being sober for 6 months. 1 - Last Use / Amount: 01/25   CIWA: CIWA-Ar BP: 103/67 mmHg Pulse Rate: 70 COWS:    PATIENT STRENGTHS: (choose at least two) Ability for insight Average or above average intelligence Capable of independent living Supportive family/friends  Allergies: No Known Allergies  Home Medications:  (Not in a hospital admission)  OB/GYN Status:  No LMP for male patient.  General Assessment Data [facility] of [facility]: [facility] TTS Assessment: In system Is this a Tele or Face-to-Face Assessment?: Face-to-Face Is this an Initial Assessment or a Re-assessment for this encounter?: Initial Assessment Marital status: Single Is patient pregnant?: No Pregnancy Status: No Living Arrangements: Other relatives (Living w/ brother in [place].) Can pt return to current living arrangement?: No Admission Status: Involuntary Is patient capable of signing voluntary admission?: No Referral Source: Self/Family/Friend Insurance type: MCD     Crisis Care Plan Living Arrangements: Other relatives (Living w/ brother in [place].) Name of Psychiatrist: None Name of Therapist: None  Education Status Is patient  currently in school?: No Highest grade of school patient has completed: 6th grade  Risk to self with the past 6 months Suicidal Ideation: No Has patient been a risk to self within the past 6 months prior to admission? : No Suicidal Intent: No Has patient had any suicidal intent within the past 6 months prior to admission? : No Is patient at risk for suicide?: No Suicidal Plan?: No Has patient had any suicidal plan within the past 6 months prior to admission? : No Access to Means: No What has been your use of drugs/alcohol within the last 12 months?: ETOH  Previous Attempts/Gestures: No How many times?: 0 Other Self Harm Risks: None Triggers for Past Attempts: None known Intentional Self Injurious Behavior: None Family Suicide History: No Recent stressful life event(s): Recent negative physical changes (Dx of lung cancer two weeks ago.) Persecutory voices/beliefs?: No Depression: Yes Depression Symptoms: Despondent, Insomnia, Loss of interest in usual pleasures, Feeling worthless/self pity Substance abuse history and/or  treatment for substance abuse?: Yes Suicide prevention information given to non-admitted patients: Not applicable  Risk to Others within the past 6 months Homicidal Ideation: No Does patient have any lifetime risk of violence toward others beyond the six months prior to admission? : No Thoughts of Harm to Others: No Current Homicidal Intent: No Current Homicidal Plan: No Access to Homicidal Means: No Identified Victim: No one History of harm to others?: No Assessment of Violence: None Noted Violent Behavior Description: None reported Does patient have access to weapons?: No Criminal Charges Pending?: No Does patient have a court date: No Is patient on probation?: No  Psychosis Hallucinations: None noted Delusions: None noted  Mental Status Report Appearance/Hygiene: Disheveled, Body odor, In scrubs Eye Contact: Poor Motor Activity: Freedom of movement,  Unremarkable Speech: Logical/coherent, Soft Level of Consciousness: Quiet/awake Mood: Depressed, Anxious, Despair, Empty, Helpless, Sad Affect: Blunted, Depressed, Sad Anxiety Level: Panic Attacks Panic attack frequency: Situational Most recent panic attack: Can't remember Thought Processes: Coherent, Relevant Judgement: Impaired Orientation: Person, Place, Situation Obsessive Compulsive Thoughts/Behaviors: None  Cognitive Functioning Concentration: Normal Memory: Recent Intact, Remote Intact IQ: Average Insight: Poor Impulse Control: Poor Appetite: Good Weight Loss: 0 Weight Gain: 0 Sleep: Decreased Total Hours of Sleep:  (<6H/D) Vegetative Symptoms: None  ADLScreening [facility] Assessment Services) Patient's cognitive ability adequate to safely complete daily activities?: Yes Patient able to express need for assistance with ADLs?: Yes Independently performs ADLs?: Yes (appropriate for developmental age)  Prior Inpatient Therapy Prior Inpatient Therapy: Yes Prior Therapy Dates: Can't remember Prior Therapy Facilty/Provider(s): [facility] in [place] Reason for Treatment: SA  Prior Outpatient Therapy Prior Outpatient Therapy: No Prior Therapy Dates: None Prior Therapy Facilty/Provider(s): None Reason for Treatment: None Does patient have an ACCT team?: No Does patient have Intensive In-House Services?  : No Does patient have [facility] services? : No Does patient have P4CC services?: No  ADL Screening (condition at time of admission) Patient's cognitive ability adequate to safely complete daily activities?: Yes Is the patient deaf or have difficulty hearing?: No Does the patient have difficulty seeing, even when wearing glasses/contacts?: No Does the patient have difficulty concentrating, remembering, or making decisions?: No Patient able to express need for assistance with ADLs?: Yes Does the patient have difficulty dressing or bathing?: No Independently performs  ADLs?: Yes (appropriate for developmental age) Does the patient have difficulty walking or climbing stairs?: No Weakness of Legs: None Weakness of Arms/Hands: None       Abuse/Neglect Assessment (Assessment to be complete while patient is alone) Physical Abuse: Denies Verbal Abuse: Denies Sexual Abuse: Denies Exploitation of patient/patient's resources: Denies Self-Neglect: Denies     Regulatory affairs officer (For Healthcare) Does patient have an advance directive?: No Would patient like information on creating an advanced directive?: No - patient declined information    Additional Information 1:1 In Past 12 Months?: No CIRT Risk: No Elopement Risk: No Does patient have medical clearance?: Yes     Disposition:  Disposition Initial Assessment Completed for this Encounter: Yes Disposition of Patient: Other dispositions Other disposition(s): Other (Comment) (To be reviwed w/ PA.)  Curlene Dolphin Ray 09/12/2015 8:29 PM

## 2015-09-12 NOTE — ED Notes (Addendum)
Pt stated he was dx with throat cancer and received treatment and had a trach. He then aspirated some food and when the x-ray was done he was told he now has lung cancer . This occurred two weeks ago and pt has been very depressed. He stated he started to drink again after being sober for over 5 months. Pt is very pleasant and cooperative. He was given 67meq of potassium for a low k. Phoned EDP for nausea. Pt was given 8 mg of zofran for nausea. He state at home he always takes 2mg  of ativan for his nerves. Pt was medicated with this. Will monitor closely. Pts G-tube was dressed with 4x4's. He is steady on his feet. Pt remains a 1:1 and siderails are up. Pt was instructed that he is not allowed to smoke . Pt was encouraged to push po fluids and rink gatorade. 7:25pm _Phoned charge concerning a Actuary. 7;30pm_Spoke with AC concerning a sitter. 8:10pm _pt is very restless. Urinal at the bedside. Pt is on a heart monitor in a SR w/o ectopy. 8:40p-Phoned EDP as pt is requesting medication to sleep. He remains very restless. 9:30pm Pt is asleep.

## 2015-09-12 NOTE — ED Notes (Signed)
Patient and belongings wanded, two belongings bags $142 cash, shoes, shirt, coat, glasses, cell phone, lighter, wallet

## 2015-09-12 NOTE — ED Provider Notes (Signed)
CSN: [id number]     Arrival date & time 09/12/15  1608 History   First MD Initiated Contact with Patient 09/12/15 1705     Chief Complaint  Patient presents with  . IVC      (Consider location/radiation/quality/duration/timing/severity/associated sxs/prior Treatment) HPI Comments: Patient presents to the emergency department with chief complaint of requesting detox from alcohol as well as suicidal ideation. Patient reports a history of throat cancer. He states that his oncologist told him that if he comes to another visit intoxicated, he will be discharged from the practice. Because of this, the patient would like help with detox. He states that this is also caused him to have suicidal thoughts. He denies having a specific plan. Denies any homicidal ideation. Of note, he uses a feeding tube.  The history is provided by the patient. No language interpreter was used.    Past Medical History  Diagnosis Date  . ETOH abuse   . Alcohol dependence ([place]) 04/17/2012  . COPD (chronic obstructive pulmonary disease) ([place])   . Shortness of breath dyspnea     occ  . Anxiety   . GERD (gastroesophageal reflux disease)   . Seizures ([place])     one episode 09/2012, suspected related to ETOH withdrawal  . Anxiety disorder 03/12/2015  . Headache 05/10/2015  . Hypokalemia 05/10/2015  . 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 Cancer ([place])   . Throat cancer [facility])    Past Surgical History  Procedure Laterality Date  . Knee surgery Left     kneecap -screws  . Appendectomy    . Tracheostomy tube placement N/A 02/22/2015    Procedure: TRACHEOSTOMY;  Surgeon: [name], MD;  Location: [place];  Service: ENT;  Laterality: N/A;  awake tracheostomy  . Direct laryngoscopy N/A 02/22/2015    Procedure: DIRECT LARYNGOSCOPY;  Surgeon: [name], MD;  Location: [place];  Service: ENT;  Laterality: N/A;  direct laryngoscopy with biopsy  . Multiple extractions with alveoloplasty N/A 03/05/2015    Procedure: Extraction of  tooth #'s 1,2,8,9,15,16,17,18,31,32 with alveoloplasty, mandibular right lateral exostoses, and gross debridement of remaining teeth;  Surgeon: Lenn Cal, DDS;  Location: [place];  Service: Oral Surgery;  Laterality: N/A;   Family History  Problem Relation Age of Onset  . Cancer Mother   . Heart attack Maternal Grandmother     age of death 64  . Heart attack Maternal Grandfather   . Cancer Paternal Grandfather    Social History  Substance Use Topics  . Smoking status: Current Every Day Smoker -- 1.00 packs/day for 28 years    Types: Cigarettes    Last Attempt to Quit: 01/17/2015  . Smokeless tobacco: Never Used  . Alcohol Use: 0.0 oz/week    0 Standard drinks or equivalent per week     Comment: 6 -40-oz. beers daily    Review of Systems  Constitutional: Negative for fever and chills.  Respiratory: Negative for shortness of breath.   Cardiovascular: Negative for chest pain.  Gastrointestinal: Negative for nausea, vomiting, diarrhea and constipation.  Genitourinary: Negative for dysuria.  Psychiatric/Behavioral: Positive for suicidal ideas.  All other systems reviewed and are negative.     Allergies  Review of patient's allergies indicates no known allergies.  Home Medications   Prior to Admission medications   Medication Sig Start Date End Date Taking? Authorizing Provider  fludrocortisone (FLORINEF) 0.1 MG tablet Take 2 tablets by mouth daily. 09/10/15  Yes Historical Provider, MD  LORazepam (ATIVAN) 0.5 MG  tablet Take 1 tablet (0.5 mg total) by mouth 2 (two) times daily. 09/10/15  Yes [name], MD  thiamine 100 MG tablet Take 1 tablet (100 mg total) by mouth daily. Patient not taking: Reported on 09/12/2015 03/08/15   [name], MD   BP 100/69 mmHg  Pulse 87  Temp(Src) 97.7 F (36.5 C) (Oral)  Resp 18  SpO2 95% Physical Exam  Constitutional: He is oriented to person, place, and time. He appears well-developed and well-nourished.  HENT:  Head:  Normocephalic and atraumatic.  Eyes: Conjunctivae and EOM are normal. Pupils are equal, round, and reactive to light. Right eye exhibits no discharge. Left eye exhibits no discharge. No scleral icterus.  Neck: Normal range of motion. Neck supple. No JVD present.  Cardiovascular: Normal rate, regular rhythm and normal heart sounds.  Exam reveals no gallop and no friction rub.   No murmur heard. Pulmonary/Chest: Effort normal and breath sounds normal. No respiratory distress. He has no wheezes. He has no rales. He exhibits no tenderness.  Abdominal: Soft. He exhibits no distension and no mass. There is no tenderness. There is no rebound and no guarding.  Feeding tube left quadrant, no evidence of infection  Musculoskeletal: Normal range of motion. He exhibits no edema or tenderness.  Neurological: He is alert and oriented to person, place, and time.  Skin: Skin is warm and dry.  Psychiatric: He has a normal mood and affect. His behavior is normal. Judgment and thought content normal.  Nursing note and vitals reviewed.   ED Course  Procedures (including critical care time) Results for orders placed or performed during the hospital encounter of 09/12/15  Comprehensive metabolic panel  Result Value Ref Range   Sodium 143 135 - 145 mmol/L   Potassium 2.9 (L) 3.5 - 5.1 mmol/L   Chloride 103 101 - 111 mmol/L   CO2 30 22 - 32 mmol/L   Glucose, Bld 102 (H) 65 - 99 mg/dL   BUN <5 (L) 6 - 20 mg/dL   Creatinine, Ser 0.56 (L) 0.61 - 1.24 mg/dL   Calcium 8.2 (L) 8.9 - 10.3 mg/dL   Total Protein 6.8 6.5 - 8.1 g/dL   Albumin 3.7 3.5 - 5.0 g/dL   AST 31 15 - 41 U/L   ALT 13 (L) 17 - 63 U/L   Alkaline Phosphatase 133 (H) 38 - 126 U/L   Total Bilirubin 0.4 0.3 - 1.2 mg/dL   GFR calc non Af Amer >60 >60 mL/min   GFR calc Af Amer >60 >60 mL/min   Anion gap 10 5 - 15  Ethanol (ETOH)  Result Value Ref Range   Alcohol, Ethyl (B) 364 (HH) <5 mg/dL  Salicylate level  Result Value Ref Range    Salicylate Lvl 123456 2.8 - 30.0 mg/dL  Acetaminophen level  Result Value Ref Range   Acetaminophen (Tylenol), Serum <10 (L) 10 - 30 ug/mL  CBC  Result Value Ref Range   WBC 3.9 (L) 4.0 - 10.5 K/uL   RBC 4.13 (L) 4.22 - 5.81 MIL/uL   Hemoglobin 13.6 13.0 - 17.0 g/dL   HCT 40.7 39.0 - 52.0 %   MCV 98.5 78.0 - 100.0 fL   MCH 32.9 26.0 - 34.0 pg   MCHC 33.4 30.0 - 36.0 g/dL   RDW 17.3 (H) 11.5 - 15.5 %   Platelets 155 150 - 400 K/uL     I have personally reviewed and evaluated these images and lab results as part of my medical decision-making.  MDM   Final diagnoses:  Suicidal ideation  Hypokalemia  Alcohol intoxication, uncomplicated ([place])    Patient requesting detox from alcohol, also has suicidal ideation. Will check labs and attempt clear medically. Patient will need TTS consultation.  Patient seen by and discussed with Dr. Lacinda Axon, who agrees that patient is medically clear. Disposition per psych team. TTS consult pending.  Patient noted to be hypokalemic. I have replaced his potassium in the emergency department.    Montine Circle, PA-C 09/12/15 2123  Nat Christen, MD 09/13/15 1745

## 2015-09-12 NOTE — ED Notes (Signed)
Patient presents via [facility] under IVC, stating patient is being treated for CA and oncologist is threatening to discharge patient if he does not return sober.

## 2015-09-13 ENCOUNTER — Telehealth (HOSPITAL_COMMUNITY): Payer: Self-pay

## 2015-09-13 ENCOUNTER — Encounter (HOSPITAL_COMMUNITY): Payer: Self-pay | Admitting: Psychiatry

## 2015-09-13 DIAGNOSIS — F1020 Alcohol dependence, uncomplicated: Secondary | ICD-10-CM | POA: Diagnosis not present

## 2015-09-13 DIAGNOSIS — F322 Major depressive disorder, single episode, severe without psychotic features: Secondary | ICD-10-CM | POA: Diagnosis not present

## 2015-09-13 HISTORY — DX: Major depressive disorder, single episode, severe without psychotic features: F32.2

## 2015-09-13 LAB — RAPID URINE DRUG SCREEN, HOSP PERFORMED
Amphetamines: NOT DETECTED
BARBITURATES: NOT DETECTED
BENZODIAZEPINES: POSITIVE — AB
COCAINE: NOT DETECTED
Opiates: POSITIVE — AB
TETRAHYDROCANNABINOL: NOT DETECTED

## 2015-09-13 MED ORDER — FLUOXETINE HCL 20 MG PO CAPS
20.0000 mg | ORAL_CAPSULE | Freq: Every day | ORAL | Status: DC
  Administered 2015-09-13 – 2015-09-14 (×2): 20 mg via ORAL
  Filled 2015-09-13 (×2): qty 1

## 2015-09-13 MED ORDER — TRAZODONE HCL 50 MG PO TABS
50.0000 mg | ORAL_TABLET | Freq: Every day | ORAL | Status: DC
  Administered 2015-09-13: 50 mg via ORAL
  Filled 2015-09-13: qty 1

## 2015-09-13 MED ORDER — NICOTINE 21 MG/24HR TD PT24
21.0000 mg | MEDICATED_PATCH | Freq: Once | TRANSDERMAL | Status: AC
  Administered 2015-09-13: 21 mg via TRANSDERMAL
  Filled 2015-09-13: qty 1

## 2015-09-13 NOTE — ED Notes (Signed)
TCU Nurses Note 11pm to 7am :   Overnight with no issues.   Neuro/Psych :  - Sleep: Slept entire shift 11pm - 7am. Awoke once @ 5am to have a bowel movement. He was not able to obtain a urine specimen.  - HELD midnight/06:00 Ativan since he was sleeping soundly.  - Appetite : none (sleeping) - Gait : Ambulated to RR with steady gait.

## 2015-09-13 NOTE — Progress Notes (Signed)
  Oncology Nurse Navigator Documentation  Navigator Location: [facility]-Med Onc (09/10/15 1315) Navigator Encounter Type: Clinic/MDC (09/10/15 1315)           Patient Visit Type: MedOnc (09/10/15 1315)   Barriers/Navigation Needs: Coordination of Care (09/10/15 1315)   Interventions: Other (09/10/15 1315)            Acuity: Level 2 (09/10/15 1315)   Acuity Level 2: Other (09/10/15 1315)     To provide support and encouragement, care continuity and to assess for needs, met with patient during established patient appt with Dr. Alvy Bimler. I met him in [facility] lobby, obtained [facility] assist for him as he arrived intoxicated, facilitated registration, assisted guidance to Dr. Calton Dach clinic.  During weigh in, a 40 oz bottle of beer fell from his jacket.  While waiting for Dr. Alvy Bimler, I expressed concern for his apparent drinking and noticeable smoking.  He gave me permission to pour the beer down the sink.  He explained he has been worried about the results of his recent CT scan, has been drinking 2 40 oz beers daily and smoking to relieve his anxiety.  He has not returned to [place], is staying with a friend.  Dr. Alvy Bimler explained results of CT chest, explained diagnosis, tmt plan.  She explained she cannot proceed with additional chemotherapy if he continues to consume ETOH.  He verbalized understanding, stated "I'm an alcoholic, I need help".  I noted I would contact [facility] for guidance.  (I spoke with her during bi-monthly H&N Paitent the next day.)  Dr. Alvy Bimler called Mr Aman's sister, explained diagnosis, current situation.  She acknowledged she would be coming to pick him up after his appt. I transported Mr Laday to Infusion where he received IVF.  RN Kenney Houseman understands sister is to be contacted when IVF near completion.  [name], RN, BSN, [place] [facility] [place] [phone]     Time Spent with Patient:  60 (09/10/15 1315)

## 2015-09-13 NOTE — Consult Note (Signed)
[facility] Consult   Reason for Consult:  Alcoholism, Depression,  Referring Physician:  EDP Patient Identification: Marvin [name] MRN:  [id number] Principal Diagnosis: Alcohol use disorder, severe, dependence ([place]) Diagnosis:   Patient Active Problem List   Diagnosis Date Noted  . Alcohol use disorder, severe, dependence ([place]) [F10.20] 09/13/2015    Priority: High  . Severe major depression, single episode, without psychotic features ([place]) [F32.2] 09/13/2015  . Pancytopenia, acquired ([place]) [B35.329] 09/11/2015  . 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 Malignant neoplasm metastatic to lung ([place]) [C78.00]   . Hypotension [I95.9] 09/06/2015  . elevated lactate [E87.2] 09/06/2015  . Syncope [R55] 09/06/2015  . 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 Tracheostomy status ([place]) [Z93.0]   . Pancytopenia due to antineoplastic chemotherapy ([place]) [J24.268, T45.1X5A] 05/16/2015  . Anemia due to chemotherapy [D64.81, T45.1X5A] 05/11/2015  . Hypokalemia due to loss of potassium [E87.6] 05/11/2015  . Thrombocytopenia due to drugs [D69.59] 05/11/2015  . Headache [R51] 05/10/2015  . Hypokalemia [E87.6] 05/10/2015  . Hypotension, postural [I95.1] 04/10/2015  . Dehydration [E86.0] 03/28/2015  . Orthostatic hypotension [I95.1] 03/28/2015  . Mucositis due to chemotherapy [K12.31] 03/26/2015  . Chemotherapy-induced nausea [R11.0, T45.1X5A] 03/26/2015  . S/P gastrostomy ([place]) [Z93.1] 03/13/2015  . GERD (gastroesophageal reflux disease) [K21.9] 03/12/2015  . Anxiety disorder [F41.9] 03/12/2015  . Alcohol abuse [F10.10] 03/12/2015  . Tobacco abuse [Z72.0] 03/12/2015  . 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 Protein-calorie malnutrition, severe ([place]) [E43] 02/27/2015  . Dysphagia [R13.10]   . Malnutrition ([place]) [E46]   . 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 Transaminitis [R74.0] 04/19/2012  . Alcohol dependence ([place]) [F10.20] 04/17/2012     Total Time spent with patient: 45 minutes  Subjective:   Marvin [name] is a 40 y.o. male patient admitted with Depression, anxiety  HPI:  Caucasian male, 40 years old was evaluated for Alcohol and feelings of depression.  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  Patient states that he has had long standing hx of Alcoholism, Depression and anxiety.  He felt suicidal on arrival to the ER yesterday.  He states that his Oncologist threatens to stop seeing him if he does not stop drinking.   Patient reports he has been feeling depressed but does not take medications because he uses  Alcohol.  Patient reports feeling hopeless and helpless.  Patient reports that he is in pain all the time and states he drinks Alcohol to help with the pain.  Patient is speaking and does no longer have Tracheostomy in place any more.   Patient use his G Tube in place for feeding at times but eats and swallows easily.  He denies SI/HI/AVH.  Past Psychiatric History:  Anxiety disorder  Risk to Self: Suicidal Ideation: No Suicidal Intent: No Is patient at risk for suicide?: No Suicidal Plan?: No Access to Means: No What has been your use of drugs/alcohol within the last 12 months?: ETOH  How many times?: 0 Other Self Harm Risks: None Triggers for Past Attempts: None known Intentional Self Injurious Behavior: None Risk to Others: Homicidal Ideation: No Thoughts of Harm to Others: No Current Homicidal Intent: No Current Homicidal Plan: No Access to Homicidal Means: No Identified Victim: No one History of harm to others?: No Assessment of Violence: None Noted Violent Behavior Description: None reported Does patient have access to weapons?: No Criminal Charges Pending?: No Does patient have a court date: No Prior  Inpatient Therapy: Prior Inpatient Therapy: Yes Prior Therapy Dates: Can't remember Prior Therapy Facilty/Provider(s): [facility] in  [place] Reason for Treatment: SA Prior Outpatient Therapy: Prior Outpatient Therapy: No Prior Therapy Dates: None Prior Therapy Facilty/Provider(s): None Reason for Treatment: None Does patient have an ACCT team?: No Does patient have Intensive In-House Services?  : No Does patient have [facility] services? : No Does patient have P4CC services?: No  Past Medical History:  Past Medical History  Diagnosis Date  . ETOH abuse   . Alcohol dependence ([place]) 04/17/2012  . COPD (chronic obstructive pulmonary disease) ([place])   . Shortness of breath dyspnea     occ  . Anxiety   . GERD (gastroesophageal reflux disease)   . Seizures ([place])     one episode 09/2012, suspected related to ETOH withdrawal  . Anxiety disorder 03/12/2015  . Headache 05/10/2015  . Hypokalemia 05/10/2015  . 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 Cancer ([place])   . Throat cancer ([place])   . Severe major depression, single episode, without psychotic features ([place]) 09/13/2015    Past Surgical History  Procedure Laterality Date  . Knee surgery Left     kneecap -screws  . Appendectomy    . Tracheostomy tube placement N/A 02/22/2015    Procedure: TRACHEOSTOMY;  Surgeon: [name], MD;  Location: [facility];  Service: ENT;  Laterality: N/A;  awake tracheostomy  . Direct laryngoscopy N/A 02/22/2015    Procedure: DIRECT LARYNGOSCOPY;  Surgeon: [name], MD;  Location: [employer];  Service: ENT;  Laterality: N/A;  direct laryngoscopy with biopsy  . Multiple extractions with alveoloplasty N/A 03/05/2015    Procedure: Extraction of tooth #'s 1,2,8,9,15,16,17,18,31,32 with alveoloplasty, mandibular right lateral exostoses, and gross debridement of remaining teeth;  Surgeon: Lenn Cal, DDS;  Location: [facility];  Service: Oral Surgery;  Laterality: N/A;   Family History:  Family History  Problem Relation Age of Onset  . Cancer Mother   . Heart attack Maternal Grandmother     age of death 46  . Heart attack Maternal Grandfather   .  Cancer Paternal Grandfather    Family Psychiatric  History: unknown Social History:  History  Alcohol Use  . 0.0 oz/week  . 0 Standard drinks or equivalent per week    Comment: 6 -40-oz. beers daily     History  Drug Use No    Social History   Social History  . Marital Status: Single    Spouse Name: N/A  . Number of Children: N/A  . Years of Education: N/A   Social History Main Topics  . Smoking status: Current Every Day Smoker -- 1.00 packs/day for 28 years    Types: Cigarettes    Last Attempt to Quit: 01/17/2015  . Smokeless tobacco: Never Used  . Alcohol Use: 0.0 oz/week    0 Standard drinks or equivalent per week     Comment: 6 -40-oz. beers daily  . Drug Use: No  . Sexual Activity: No   Other Topics Concern  . None   Social History Narrative   Epworth sleepiness scale as of 07/16/15 14   Additional Social History:    Pain Medications: See PTA medication list Prescriptions: See PTA medication list Over the Counter: See PTA medication list History of alcohol / drug use?: Yes Longest period of sobriety (when/how long): 6 months sobriety in 2016 Negative Consequences of Use: Personal relationships Withdrawal Symptoms: Agitation, Diarrhea, Fever / Chills, Nausea / Vomiting, Patient aware of relationship between  substance abuse and physical/medical complications, Weakness, Tremors, Seizures Onset of Seizures: Onset unclear Date of most recent seizure: "I had one earlier this morning." Name of Substance 1: ETOH (beer) 1 - Age of First Use: 40 years of age 10 - Amount (size/oz): 2-3 forties per day 1 - Frequency: Daily 1 - Duration: Last couple of weeks after being sober for 6 months. 1 - Last Use / Amount: 01/25    Allergies:  No Known Allergies  Labs:  Results for orders placed or performed during the hospital encounter of 09/12/15 (from the past 48 hour(s))  Comprehensive metabolic panel     Status: Abnormal   Collection Time: 09/12/15  4:34 PM  Result  Value Ref Range   Sodium 143 135 - 145 mmol/L   Potassium 2.9 (L) 3.5 - 5.1 mmol/L   Chloride 103 101 - 111 mmol/L   CO2 30 22 - 32 mmol/L   Glucose, Bld 102 (H) 65 - 99 mg/dL   BUN <5 (L) 6 - 20 mg/dL   Creatinine, Ser 0.56 (L) 0.61 - 1.24 mg/dL   Calcium 8.2 (L) 8.9 - 10.3 mg/dL   Total Protein 6.8 6.5 - 8.1 g/dL   Albumin 3.7 3.5 - 5.0 g/dL   AST 31 15 - 41 U/L   ALT 13 (L) 17 - 63 U/L   Alkaline Phosphatase 133 (H) 38 - 126 U/L   Total Bilirubin 0.4 0.3 - 1.2 mg/dL   GFR calc non Af Amer >60 >60 mL/min   GFR calc Af Amer >60 >60 mL/min    Comment: (NOTE) The eGFR has been calculated using the CKD EPI equation. This calculation has not been validated in all clinical situations. eGFR's persistently <60 mL/min signify possible Chronic Kidney Disease.    Anion gap 10 5 - 15  Ethanol (ETOH)     Status: Abnormal   Collection Time: 09/12/15  4:34 PM  Result Value Ref Range   Alcohol, Ethyl (B) 364 (HH) <5 mg/dL    Comment:        LOWEST DETECTABLE LIMIT FOR SERUM ALCOHOL IS 5 mg/dL FOR MEDICAL PURPOSES ONLY CRITICAL RESULT CALLED TO, READ BACK BY AND VERIFIED WITH: DREWRY K RN AT 3825 ON 1.25.17 BY MENDOZA B   Salicylate level     Status: None   Collection Time: 09/12/15  4:34 PM  Result Value Ref Range   Salicylate Lvl <0.5 2.8 - 30.0 mg/dL  Acetaminophen level     Status: Abnormal   Collection Time: 09/12/15  4:34 PM  Result Value Ref Range   Acetaminophen (Tylenol), Serum <10 (L) 10 - 30 ug/mL    Comment:        THERAPEUTIC CONCENTRATIONS VARY SIGNIFICANTLY. A RANGE OF 10-30 ug/mL MAY BE AN EFFECTIVE CONCENTRATION FOR MANY PATIENTS. HOWEVER, SOME ARE BEST TREATED AT CONCENTRATIONS OUTSIDE THIS RANGE. ACETAMINOPHEN CONCENTRATIONS >150 ug/mL AT 4 HOURS AFTER INGESTION AND >50 ug/mL AT 12 HOURS AFTER INGESTION ARE OFTEN ASSOCIATED WITH TOXIC REACTIONS.   CBC     Status: Abnormal   Collection Time: 09/12/15  4:34 PM  Result Value Ref Range   WBC 3.9 (L) 4.0 -  10.5 K/uL   RBC 4.13 (L) 4.22 - 5.81 MIL/uL   Hemoglobin 13.6 13.0 - 17.0 g/dL   HCT 40.7 39.0 - 52.0 %   MCV 98.5 78.0 - 100.0 fL   MCH 32.9 26.0 - 34.0 pg   MCHC 33.4 30.0 - 36.0 g/dL   RDW 17.3 (H) 11.5 - 15.5 %  Platelets 155 150 - 400 K/uL  Urine rapid drug screen (hosp performed) (Not at [facility])     Status: Abnormal   Collection Time: 09/13/15  8:44 AM  Result Value Ref Range   Opiates POSITIVE (A) NONE DETECTED   Cocaine NONE DETECTED NONE DETECTED   Benzodiazepines POSITIVE (A) NONE DETECTED   Amphetamines NONE DETECTED NONE DETECTED   Tetrahydrocannabinol NONE DETECTED NONE DETECTED   Barbiturates NONE DETECTED NONE DETECTED    Comment:        DRUG SCREEN FOR MEDICAL PURPOSES ONLY.  IF CONFIRMATION IS NEEDED FOR ANY PURPOSE, NOTIFY LAB WITHIN 5 DAYS.        LOWEST DETECTABLE LIMITS FOR URINE DRUG SCREEN Drug Class       Cutoff (ng/mL) Amphetamine      1000 Barbiturate      200 Benzodiazepine   169 Tricyclics       450 Opiates          300 Cocaine          300 THC              50     Current Facility-Administered Medications  Medication Dose Route Frequency Provider Last Rate Last Dose  . FLUoxetine (PROZAC) capsule 20 mg  20 mg Oral Daily Yaquelin Langelier   20 mg at 09/13/15 1313  . LORazepam (ATIVAN) tablet 0-4 mg  0-4 mg Oral 4 times per day Montine Circle, PA-C   4 mg at 09/13/15 1008   Followed by  . [START ON 09/14/2015] LORazepam (ATIVAN) tablet 0-4 mg  0-4 mg Oral Q12H Montine Circle, PA-C      . nicotine (NICODERM CQ - dosed in mg/24 hours) patch 21 mg  21 mg Transdermal Once Krystal Delduca   21 mg at 09/13/15 1103  . ondansetron (ZOFRAN-ODT) disintegrating tablet 8 mg  8 mg Oral Q8H PRN Nat Christen, MD   8 mg at 09/13/15 0850  . traZODone (DESYREL) tablet 50 mg  50 mg Oral QHS Whitley Patchen       Current Outpatient Prescriptions  Medication Sig Dispense Refill  . fludrocortisone (FLORINEF) 0.1 MG tablet Take 2 tablets by mouth daily.  0  .  LORazepam (ATIVAN) 0.5 MG tablet Take 1 tablet (0.5 mg total) by mouth 2 (two) times daily. 14 tablet 0  . thiamine 100 MG tablet Take 1 tablet (100 mg total) by mouth daily. (Patient not taking: Reported on 09/12/2015) 30 tablet 0   Facility-Administered Medications Ordered in Other Encounters  Medication Dose Route Frequency Provider Last Rate Last Dose  . 0.9 %  sodium chloride infusion   Intravenous Once [name], MD        Musculoskeletal: Strength & Muscle Tone: within normal limits Gait & Station: normal Patient leans: N/A  Psychiatric Specialty Exam: Review of Systems  Constitutional: Negative.   HENT: Negative.   Eyes: Negative.   Respiratory:       Hx of CA of the throat  Cardiovascular: Negative.   Gastrointestinal:       Uses GTube  For feeding.  Genitourinary: Negative.   Musculoskeletal: Negative.   Skin: Negative.   Neurological: Negative.   Endo/Heme/Allergies: Negative.     Blood pressure 126/80, pulse 64, temperature 99.6 F (37.6 C), temperature source Oral, resp. rate 16, SpO2 96 %.There is no weight on file to calculate BMI.  General Appearance: Casual  Eye Contact::  Good  Speech:  Clear and Coherent and Normal Rate  Volume:  Normal  Mood:  Anxious, Depressed, Hopeless and helpless.  Affect:  Congruent and Flat  Thought Process:  Coherent, Goal Directed and Intact  Orientation:  Full (Time, Place, and Person)  Thought Content:  WDL  Suicidal Thoughts:  No  Homicidal Thoughts:  No  Memory:  Immediate;   Good Recent;   Good Remote;   Good  Judgement:  Fair  Insight:  Fair  Psychomotor Activity:  Psychomotor Retardation  Concentration:  Good  Recall:  [place] of Knowledge:Fair  Language: Good  Akathisia:  NA  Handed:  Right  AIMS (if indicated):     Assets:  Desire for Improvement  ADL's:  Intact  Cognition: WNL  Sleep:      Treatment Plan Summary: Daily contact with patient to assess and evaluate symptoms and progress in treatment  and Medication management  Disposition: Accepted for admission, we will be seeking placement at any facility with available bed.  We have initiated our Ativan protocol for his detox treatment and we have started patient on Prozac 20 mg po daily for depression.  Delfin Gant   PMHNP-BC 09/13/2015 3:43 PM Patient seen face-to-face for psychiatric evaluation, chart reviewed and case discussed with the physician extender and developed treatment plan. Reviewed the information documented and agree with the treatment plan. Corena Pilgrim, MD

## 2015-09-13 NOTE — BH Assessment (Signed)
[facility] [place] Assessment Progress Note  The following facilities have been contacted to seek placement for this pt, with results as noted:  Beds available, information sent, decision pending:  [place]:  [facility] (no dual diagnosis program)   At capacity:  [facility] [place] [place], [place] Triage Specialist [phone]

## 2015-09-13 NOTE — ED Notes (Signed)
Pt states "I drink 3-4 40's a day.  I was drinking cause after I got out of the nursing home I couldn't get any medicine."

## 2015-09-13 NOTE — ED Notes (Addendum)
Pt is pleasant . He requested to sleep. Pt appears comfortable and stated he would like to take a shower in the afternoon. Respirations are regular and non -labored(3pm )Phoned EDP concerning pts G-tube. He c/o it feels like it is coming out. EDP will evaluate. Pt has a sitter for SI although he denies at this point in time. 3pReport to the oncoming shift

## 2015-09-13 NOTE — ED Notes (Signed)
Psych MD at bedside

## 2015-09-14 ENCOUNTER — Inpatient Hospital Stay
Admission: EM | Admit: 2015-09-14 | Discharge: 2015-09-17 | DRG: 885 | Disposition: A | Discharge status: Home / Self Care | Payer: Medicaid Other | Source: Intra-hospital | Attending: Psychiatry | Admitting: Psychiatry

## 2015-09-14 ENCOUNTER — Telehealth: Payer: Self-pay

## 2015-09-14 DIAGNOSIS — J449 Chronic obstructive pulmonary disease, unspecified: Secondary | ICD-10-CM | POA: Diagnosis present

## 2015-09-14 DIAGNOSIS — F1020 Alcohol dependence, uncomplicated: Secondary | ICD-10-CM | POA: Diagnosis not present

## 2015-09-14 DIAGNOSIS — G4700 Insomnia, unspecified: Secondary | ICD-10-CM | POA: Diagnosis present

## 2015-09-14 DIAGNOSIS — F419 Anxiety disorder, unspecified: Secondary | ICD-10-CM | POA: Diagnosis present

## 2015-09-14 DIAGNOSIS — F322 Major depressive disorder, single episode, severe without psychotic features: Secondary | ICD-10-CM | POA: Diagnosis not present

## 2015-09-14 DIAGNOSIS — F331 Major depressive disorder, recurrent, moderate: Secondary | ICD-10-CM | POA: Diagnosis not present

## 2015-09-14 DIAGNOSIS — Z9221 Personal history of antineoplastic chemotherapy: Secondary | ICD-10-CM

## 2015-09-14 DIAGNOSIS — K219 Gastro-esophageal reflux disease without esophagitis: Secondary | ICD-10-CM | POA: Diagnosis present

## 2015-09-14 DIAGNOSIS — F17210 Nicotine dependence, cigarettes, uncomplicated: Secondary | ICD-10-CM | POA: Diagnosis present

## 2015-09-14 DIAGNOSIS — E440 Moderate protein-calorie malnutrition: Secondary | ICD-10-CM | POA: Diagnosis present

## 2015-09-14 DIAGNOSIS — G8929 Other chronic pain: Secondary | ICD-10-CM | POA: Diagnosis present

## 2015-09-14 DIAGNOSIS — Z720 Tobacco use: Secondary | ICD-10-CM | POA: Diagnosis present

## 2015-09-14 DIAGNOSIS — C321 Malignant neoplasm of supraglottis: Secondary | ICD-10-CM | POA: Diagnosis present

## 2015-09-14 DIAGNOSIS — Z8249 Family history of ischemic heart disease and other diseases of the circulatory system: Secondary | ICD-10-CM | POA: Diagnosis not present

## 2015-09-14 DIAGNOSIS — Z9889 Other specified postprocedural states: Secondary | ICD-10-CM | POA: Diagnosis not present

## 2015-09-14 DIAGNOSIS — G893 Neoplasm related pain (acute) (chronic): Secondary | ICD-10-CM

## 2015-09-14 DIAGNOSIS — Z9049 Acquired absence of other specified parts of digestive tract: Secondary | ICD-10-CM | POA: Diagnosis not present

## 2015-09-14 DIAGNOSIS — Z8521 Personal history of malignant neoplasm of larynx: Secondary | ICD-10-CM | POA: Diagnosis not present

## 2015-09-14 DIAGNOSIS — Z809 Family history of malignant neoplasm, unspecified: Secondary | ICD-10-CM | POA: Diagnosis not present

## 2015-09-14 DIAGNOSIS — I951 Orthostatic hypotension: Secondary | ICD-10-CM | POA: Diagnosis present

## 2015-09-14 MED ORDER — MAGNESIUM HYDROXIDE 400 MG/5ML PO SUSP: 30.0000 mL | Freq: Every day | ORAL | Status: DC | PRN

## 2015-09-14 MED ORDER — LORAZEPAM 0.5 MG PO TABS
0.5000 mg | ORAL_TABLET | Freq: Two times a day (BID) | ORAL | Status: DC
  Administered 2015-09-14 – 2015-09-15 (×2): 0.5 mg via ORAL
  Filled 2015-09-14 (×2): qty 1

## 2015-09-14 MED ORDER — NICOTINE 21 MG/24HR TD PT24
21.0000 mg | MEDICATED_PATCH | Freq: Once | TRANSDERMAL | Status: DC
  Administered 2015-09-14: 21 mg via TRANSDERMAL
  Filled 2015-09-14: qty 1

## 2015-09-14 MED ORDER — TRAZODONE HCL 100 MG PO TABS
100.0000 mg | ORAL_TABLET | Freq: Every evening | ORAL | Status: DC | PRN
  Administered 2015-09-14: 100 mg via ORAL
  Filled 2015-09-14: qty 1

## 2015-09-14 MED ORDER — POTASSIUM CHLORIDE CRYS ER 10 MEQ PO TBCR
10.0000 meq | EXTENDED_RELEASE_TABLET | Freq: Two times a day (BID) | ORAL | Status: DC
  Administered 2015-09-14: 10 meq via ORAL
  Filled 2015-09-14: qty 1

## 2015-09-14 MED ORDER — ACETAMINOPHEN 325 MG PO TABS: 650.0000 mg | ORAL_TABLET | Freq: Four times a day (QID) | ORAL | Status: DC | PRN

## 2015-09-14 MED ORDER — FLUDROCORTISONE ACETATE 0.1 MG PO TABS
0.2000 mg | ORAL_TABLET | Freq: Every day | ORAL | Status: DC
  Administered 2015-09-15 – 2015-09-17 (×3): 0.2 mg via ORAL
  Filled 2015-09-14 (×4): qty 2

## 2015-09-14 MED ORDER — ALUM & MAG HYDROXIDE-SIMETH 200-200-20 MG/5ML PO SUSP
30.0000 mL | ORAL | Status: DC | PRN
  Administered 2015-09-16: 30 mL via ORAL
  Filled 2015-09-14: qty 30

## 2015-09-14 NOTE — ED Notes (Signed)
Call placed to [facility] Department

## 2015-09-14 NOTE — Telephone Encounter (Signed)
Pt calling stating he is in the hospital. He was supposed to get his chemo today. He is asking about this. I stated sometimes pt do not get chemo while in hospital. I will inform Dr Alvy Bimler he is asking about this.

## 2015-09-14 NOTE — ED Notes (Addendum)
Pt is asleep resting on his right side. He remains with a Actuary.pt ate 100% of his breakfast. Pt will be admitted . Pt did state he is feeling better and not as restless. Spoke with the case worker who stated pt was told from the cancer center the pt usually gets IV fluids when he comes to the center. Case Worker will discuss this with the EDP and phone back. (11:20am)Report to [place], Therapist, sports. (1:20pm)

## 2015-09-14 NOTE — ED Notes (Signed)
Pt is asleep . Respirations are regular and non-labored

## 2015-09-14 NOTE — ED Notes (Signed)
Pt up using restroom. 

## 2015-09-14 NOTE — ED Notes (Signed)
Pt sleeping. Will delay CIWA score until patient wakes up.

## 2015-09-14 NOTE — Progress Notes (Signed)
CSW received information via voicemail from Nurse CM regarding patient not having an appointment for chemotherapy on today. Nurse CM reports patient does not have future appointments for chemotherapy. Nurse CM reports patient is given fluids, due to throat cancer. CSW allowed NP to listen to the voicemail to obtain this current information.  Genice Rouge [phone] ED CSW 09/14/2015 12:24 PM

## 2015-09-14 NOTE — Progress Notes (Signed)
Pt noted without a pcp  Cm spoke with pt who confirms this and coverage as medicaid  Cm provided a list of medicaid providers for [employer] transportation # and address and contact # for [employer]  Pt confirms he called to tell oncology he was at [facility] ED with anticipated d/c plans for detox  Pt asked CM questions and Cm discussed with him the information from the call to [facility] oncology triage RN, EDP, TCU RN related encouraging fluids and if poor intake availability of iv fluids Informed him of the cancelled 09/14/15 appt and if Cm receives a call from [place] about other requests from Dr Alvy Bimler Cm would let him and his TCU RN know  Pt voiced understanding

## 2015-09-14 NOTE — Progress Notes (Signed)
Pt discussed in [facility] progression meeting and pt informed [facility] staff of an oncology appt today for chemo Requested assist with coordinating this appt 1120 Cm spoke with ED charge Rn to see if possible assistance available to take pt to an oncology appt 1123 Spoke with Oncology triage Anibal Henderson who assists with infusions -she states pt called today to update cancer center and cancelled oncology appt for today, no future appts scheduled, not needing chemo, his visits were for iv fluids ( "one liter over two hours") CM discussed the anticipated d/c plan for [facility] pending an available bed Cathy to call Dr Alvy Bimler to see if pt needs f/u appt and will return call to CM   1124 updated ED charge RN on pt's cancelled oncology appt today, not needing chemo, his visits were for iv fluids ( "one liter over two hours")- appears pt is taking in fluids and solids well per notes 1125 LVM for ED SW requesting update to [facility] staff about pt's cancelled oncology appt today, not needing chemo, his visits were for iv fluids ( "one liter over two hours") 1126 called TCU to updated pt RN, Marcie Bal pt's cancelled oncology appt today, not needing chemo, his visits were for iv fluids ( "one liter over two hours") Pt has an infusion port per janet 1129 Cm called to updated EDP [place] on pt's cancelled oncology appt today, not needing chemo, his visits were for iv fluids ( "one liter over two hours") and TCU RN states if this is needed to let her know at x [phone], order for encourage fluids  Order entered to encourage fluids

## 2015-09-14 NOTE — Consult Note (Signed)
[facility] Consult   Reason for Consult:  Alcoholism, Depression,  Referring Physician:  EDP Patient Identification: Marvin [name] MRN:  [id number] Principal Diagnosis: Alcohol use disorder, severe, dependence ([place]) Diagnosis:   Patient Active Problem List   Diagnosis Date Noted  . Severe major depression, single episode, without psychotic features ([place]) [F32.2] 09/13/2015    Priority: High  . Alcohol use disorder, severe, dependence ([place]) [F10.20] 09/13/2015    Priority: High  . Alcohol intoxication ([place]) [F10.129]   . Pancytopenia, acquired ([place]) [Y48.250] 09/11/2015  . 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 Malignant neoplasm metastatic to lung ([place]) [C78.00]   . Hypotension [I95.9] 09/06/2015  . elevated lactate [E87.2] 09/06/2015  . Syncope [R55] 09/06/2015  . 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 Tracheostomy status ([place]) [Z93.0]   . Pancytopenia due to antineoplastic chemotherapy ([place]) [I37.048, T45.1X5A] 05/16/2015  . Anemia due to chemotherapy [D64.81, T45.1X5A] 05/11/2015  . Hypokalemia due to loss of potassium [E87.6] 05/11/2015  . Thrombocytopenia due to drugs [D69.59] 05/11/2015  . Headache [R51] 05/10/2015  . Hypokalemia [E87.6] 05/10/2015  . Hypotension, postural [I95.1] 04/10/2015  . Dehydration [E86.0] 03/28/2015  . Orthostatic hypotension [I95.1] 03/28/2015  . Mucositis due to chemotherapy [K12.31] 03/26/2015  . Chemotherapy-induced nausea [R11.0, T45.1X5A] 03/26/2015  . S/P gastrostomy ([place]) [Z93.1] 03/13/2015  . GERD (gastroesophageal reflux disease) [K21.9] 03/12/2015  . Anxiety disorder [F41.9] 03/12/2015  . Alcohol abuse [F10.10] 03/12/2015  . Tobacco abuse [Z72.0] 03/12/2015  . 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 Protein-calorie malnutrition, severe ([place]) [E43] 02/27/2015  . Dysphagia [R13.10]   . Malnutrition ([place]) [E46]   . 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 Transaminitis  [R74.0] 04/19/2012  . Alcohol dependence ([place]) [F10.20] 04/17/2012    Total Time spent with patient: 30 minutes  Subjective:   Marvin [name] is a 40 y.o. male patient admitted with Depression, anxiety  HPI:   On Admission: Caucasian male, 40 years old was evaluated for Alcohol and feelings of depression.  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  Patient states that he has had long standing hx of Alcoholism, Depression and anxiety.  He felt suicidal on arrival to the ER yesterday.  He states that his Oncologist threatens to stop seeing him if he does not stop drinking.   Patient reports he has been feeling depressed but does not take medications because he uses  Alcohol.  Patient reports feeling hopeless and helpless.  Patient reports that he is in pain all the time and states he drinks Alcohol to help with the pain.  Patient is speaking and does no longer have Tracheostomy in place any more.   Patient use his G Tube in place for feeding at times but eats and swallows easily.  He denies SI/HI/AVH.  Today: Patient still endorses depression with suicidal ideation but vague plan but appears to minimize his symptoms. Patient reports that he slept "better" last night and that he feels that he is "eating better" today. The patient states that he is amenable to inpatient treatment. The client reports no withdrawal symptoms today other than "shakiness". Client denies homicidal ideation and auditory or visual hallucinations at this time.  Past Psychiatric History:  Anxiety disorder  Risk to Self: Suicidal Ideation: No Suicidal Intent: No Is patient at risk for suicide?: No Suicidal Plan?: No Access to Means: No What has been your use of drugs/alcohol within the last 12 months?: ETOH  How  many times?: 0 Other Self Harm Risks: None Triggers for Past Attempts: None known Intentional Self Injurious Behavior: None Risk to Others: Homicidal Ideation:  No Thoughts of Harm to Others: No Current Homicidal Intent: No Current Homicidal Plan: No Access to Homicidal Means: No Identified Victim: No one History of harm to others?: No Assessment of Violence: None Noted Violent Behavior Description: None reported Does patient have access to weapons?: No Criminal Charges Pending?: No Does patient have a court date: No Prior Inpatient Therapy: Prior Inpatient Therapy: Yes Prior Therapy Dates: Can't remember Prior Therapy Facilty/Provider(s): [facility] in [place] Reason for Treatment: SA Prior Outpatient Therapy: Prior Outpatient Therapy: No Prior Therapy Dates: None Prior Therapy Facilty/Provider(s): None Reason for Treatment: None Does patient have an ACCT team?: No Does patient have Intensive In-House Services?  : No Does patient have [facility] services? : No Does patient have P4CC services?: No  Past Medical History:  Past Medical History  Diagnosis Date  . ETOH abuse   . Alcohol dependence (Marvin [name]) 04/17/2012  . COPD (chronic obstructive pulmonary disease) ([place])   . Shortness of breath dyspnea     occ  . Anxiety   . GERD (gastroesophageal reflux disease)   . Seizures ([place])     one episode 09/2012, suspected related to ETOH withdrawal  . Anxiety disorder 03/12/2015  . Headache 05/10/2015  . Hypokalemia 05/10/2015  . 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 Cancer ([place])   . Throat cancer ([place])   . Severe major depression, single episode, without psychotic features (Marvin [name]) 09/13/2015    Past Surgical History  Procedure Laterality Date  . Knee surgery Left     kneecap -screws  . Appendectomy    . Tracheostomy tube placement N/A 02/22/2015    Procedure: TRACHEOSTOMY;  Surgeon: [name], MD;  Location: [facility];  Service: ENT;  Laterality: N/A;  awake tracheostomy  . Direct laryngoscopy N/A 02/22/2015    Procedure: DIRECT LARYNGOSCOPY;  Surgeon: [name], MD;  Location: [facility];  Service: ENT;  Laterality: N/A;  direct laryngoscopy  with biopsy  . Multiple extractions with alveoloplasty N/A 03/05/2015    Procedure: Extraction of tooth #'s 1,2,8,9,15,16,17,18,31,32 with alveoloplasty, mandibular right lateral exostoses, and gross debridement of remaining teeth;  Surgeon: Lenn Cal, DDS;  Location: [facility];  Service: Oral Surgery;  Laterality: N/A;   Family History:  Family History  Problem Relation Age of Onset  . Cancer Mother   . Heart attack Maternal Grandmother     age of death 69  . Heart attack Maternal Grandfather   . Cancer Paternal Grandfather    Family Psychiatric  History: unknown Social History:  History  Alcohol Use  . 0.0 oz/week  . 0 Standard drinks or equivalent per week    Comment: 6 -40-oz. beers daily     History  Drug Use No    Social History   Social History  . Marital Status: Single    Spouse Name: N/A  . Number of Children: N/A  . Years of Education: N/A   Social History Main Topics  . Smoking status: Current Every Day Smoker -- 1.00 packs/day for 28 years    Types: Cigarettes    Last Attempt to Quit: 01/17/2015  . Smokeless tobacco: Never Used  . Alcohol Use: 0.0 oz/week    0 Standard drinks or equivalent per week     Comment: 6 -40-oz. beers daily  . Drug Use: No  . Sexual Activity: No   Other Topics  Concern  . None   Social History Narrative   Epworth sleepiness scale as of 07/16/15 14   Additional Social History:    Pain Medications: See PTA medication list Prescriptions: See PTA medication list Over the Counter: See PTA medication list History of alcohol / drug use?: Yes Longest period of sobriety (when/how long): 6 months sobriety in 2016 Negative Consequences of Use: Personal relationships Withdrawal Symptoms: Agitation, Diarrhea, Fever / Chills, Nausea / Vomiting, Patient aware of relationship between substance abuse and physical/medical complications, Weakness, Tremors, Seizures Onset of Seizures: Onset unclear Date of most recent seizure: "I had one  earlier this morning." Name of Substance 1: ETOH (beer) 1 - Age of First Use: 40 years of age 9 - Amount (size/oz): 2-3 forties per day 1 - Frequency: Daily 1 - Duration: Last couple of weeks after being sober for 6 months. 1 - Last Use / Amount: 01/25    Allergies:  No Known Allergies  Labs:  Results for orders placed or performed during the hospital encounter of 09/12/15 (from the past 48 hour(s))  Comprehensive metabolic panel     Status: Abnormal   Collection Time: 09/12/15  4:34 PM  Result Value Ref Range   Sodium 143 135 - 145 mmol/L   Potassium 2.9 (L) 3.5 - 5.1 mmol/L   Chloride 103 101 - 111 mmol/L   CO2 30 22 - 32 mmol/L   Glucose, Bld 102 (H) 65 - 99 mg/dL   BUN <5 (L) 6 - 20 mg/dL   Creatinine, Ser 0.56 (L) 0.61 - 1.24 mg/dL   Calcium 8.2 (L) 8.9 - 10.3 mg/dL   Total Protein 6.8 6.5 - 8.1 g/dL   Albumin 3.7 3.5 - 5.0 g/dL   AST 31 15 - 41 U/L   ALT 13 (L) 17 - 63 U/L   Alkaline Phosphatase 133 (H) 38 - 126 U/L   Total Bilirubin 0.4 0.3 - 1.2 mg/dL   GFR calc non Af Amer >60 >60 mL/min   GFR calc Af Amer >60 >60 mL/min    Comment: (NOTE) The eGFR has been calculated using the CKD EPI equation. This calculation has not been validated in all clinical situations. eGFR's persistently <60 mL/min signify possible Chronic Kidney Disease.    Anion gap 10 5 - 15  Ethanol (ETOH)     Status: Abnormal   Collection Time: 09/12/15  4:34 PM  Result Value Ref Range   Alcohol, Ethyl (B) 364 (HH) <5 mg/dL    Comment:        LOWEST DETECTABLE LIMIT FOR SERUM ALCOHOL IS 5 mg/dL FOR MEDICAL PURPOSES ONLY CRITICAL RESULT CALLED TO, READ BACK BY AND VERIFIED WITH: DREWRY K RN AT 6256 ON 1.25.17 BY MENDOZA B   Salicylate level     Status: None   Collection Time: 09/12/15  4:34 PM  Result Value Ref Range   Salicylate Lvl <3.8 2.8 - 30.0 mg/dL  Acetaminophen level     Status: Abnormal   Collection Time: 09/12/15  4:34 PM  Result Value Ref Range   Acetaminophen (Tylenol),  Serum <10 (L) 10 - 30 ug/mL    Comment:        THERAPEUTIC CONCENTRATIONS VARY SIGNIFICANTLY. A RANGE OF 10-30 ug/mL MAY BE AN EFFECTIVE CONCENTRATION FOR MANY PATIENTS. HOWEVER, SOME ARE BEST TREATED AT CONCENTRATIONS OUTSIDE THIS RANGE. ACETAMINOPHEN CONCENTRATIONS >150 ug/mL AT 4 HOURS AFTER INGESTION AND >50 ug/mL AT 12 HOURS AFTER INGESTION ARE OFTEN ASSOCIATED WITH TOXIC REACTIONS.   CBC  Status: Abnormal   Collection Time: 09/12/15  4:34 PM  Result Value Ref Range   WBC 3.9 (L) 4.0 - 10.5 K/uL   RBC 4.13 (L) 4.22 - 5.81 MIL/uL   Hemoglobin 13.6 13.0 - 17.0 g/dL   HCT 40.7 39.0 - 52.0 %   MCV 98.5 78.0 - 100.0 fL   MCH 32.9 26.0 - 34.0 pg   MCHC 33.4 30.0 - 36.0 g/dL   RDW 17.3 (H) 11.5 - 15.5 %   Platelets 155 150 - 400 K/uL  Urine rapid drug screen (hosp performed) (Not at [facility])     Status: Abnormal   Collection Time: 09/13/15  8:44 AM  Result Value Ref Range   Opiates POSITIVE (A) NONE DETECTED   Cocaine NONE DETECTED NONE DETECTED   Benzodiazepines POSITIVE (A) NONE DETECTED   Amphetamines NONE DETECTED NONE DETECTED   Tetrahydrocannabinol NONE DETECTED NONE DETECTED   Barbiturates NONE DETECTED NONE DETECTED    Comment:        DRUG SCREEN FOR MEDICAL PURPOSES ONLY.  IF CONFIRMATION IS NEEDED FOR ANY PURPOSE, NOTIFY LAB WITHIN 5 DAYS.        LOWEST DETECTABLE LIMITS FOR URINE DRUG SCREEN Drug Class       Cutoff (ng/mL) Amphetamine      1000 Barbiturate      200 Benzodiazepine   564 Tricyclics       332 Opiates          300 Cocaine          300 THC              50     Current Facility-Administered Medications  Medication Dose Route Frequency Provider Last Rate Last Dose  . FLUoxetine (PROZAC) capsule 20 mg  20 mg Oral Daily Shadee Rathod   20 mg at 09/14/15 0905  . LORazepam (ATIVAN) tablet 0-4 mg  0-4 mg Oral 4 times per day Montine Circle, PA-C   1 mg at 09/14/15 0015   Followed by  . LORazepam (ATIVAN) tablet 0-4 mg  0-4 mg Oral Q12H  Montine Circle, PA-C   1 mg at 09/14/15 9518  . nicotine (NICODERM CQ - dosed in mg/24 hours) patch 21 mg  21 mg Transdermal Once Lacretia Leigh, MD   21 mg at 09/14/15 0920  . ondansetron (ZOFRAN-ODT) disintegrating tablet 8 mg  8 mg Oral Q8H PRN Nat Christen, MD   8 mg at 09/13/15 0850  . potassium chloride (K-DUR,KLOR-CON) CR tablet 10 mEq  10 mEq Oral BID Patrecia Pour, NP   10 mEq at 09/14/15 0905  . traZODone (DESYREL) tablet 50 mg  50 mg Oral QHS Emerson Schreifels   50 mg at 09/13/15 2212   Current Outpatient Prescriptions  Medication Sig Dispense Refill  . fludrocortisone (FLORINEF) 0.1 MG tablet Take 2 tablets by mouth daily.  0  . LORazepam (ATIVAN) 0.5 MG tablet Take 1 tablet (0.5 mg total) by mouth 2 (two) times daily. 14 tablet 0  . thiamine 100 MG tablet Take 1 tablet (100 mg total) by mouth daily. (Patient not taking: Reported on 09/12/2015) 30 tablet 0   Facility-Administered Medications Ordered in Other Encounters  Medication Dose Route Frequency Provider Last Rate Last Dose  . 0.9 %  sodium chloride infusion   Intravenous Once [name], MD        Musculoskeletal: Strength & Muscle Tone: within normal limits Gait & Station: normal Patient leans: N/A  Psychiatric Specialty Exam: Review of Systems  Constitutional: Negative.   HENT: Negative.   Eyes: Negative.   Respiratory: Negative.        Hx of CA of the throat  Cardiovascular: Negative.   Gastrointestinal:       Uses GTube  For feeding.  Genitourinary: Negative.   Musculoskeletal: Negative.   Skin: Negative.   Neurological: Negative.   Endo/Heme/Allergies: Negative.   Psychiatric/Behavioral: Positive for depression, suicidal ideas and substance abuse.    Blood pressure 143/83, pulse 70, temperature 98.6 F (37 C), temperature source Oral, resp. rate 18, SpO2 99 %.There is no weight on file to calculate BMI.  General Appearance: Casual  Eye Contact::  Good  Speech:  Clear and Coherent and Normal Rate   Volume:  Normal  Mood:  Anxious, Depressed, Hopeless and helpless.  Affect:  Congruent  Thought Process:  Coherent, Goal Directed and Intact  Orientation:  Full (Time, Place, and Person)  Thought Content:  WDL  Suicidal Thoughts:  No  Homicidal Thoughts:  No  Memory:  Immediate;   Good Recent;   Good Remote;   Good  Judgement:  Fair  Insight:  Fair  Psychomotor Activity:  Psychomotor Retardation  Concentration:  Good  Recall:  Apex of Knowledge:Fair  Language: Good  Akathisia:  NA  Handed:  Right  AIMS (if indicated):     Assets:  Desire for Improvement  ADL's:  Intact  Cognition: WNL  Sleep:      Treatment Plan Summary: Daily contact with patient to assess and evaluate symptoms and progress in treatment and Medication management Diagnosis: Major Depressive Disorder, single episode, severe without psychotic features -Crisis Stabilization -Individual & Substance Abuse Counseling -Medication: Continue:   Prozac 42m Daily for mood stabilization  Trazodone 527mat bedtime for insomnia  Disposition: Seeking inpatient placement at facility with available bed.    LOWaylan Boga PMH-NP 09/14/2015 12:15 PM  Patient seen face-to-face for psychiatric evaluation, chart reviewed and case discussed with the physician extender and developed treatment plan. Reviewed the information documented and agree with the treatment plan. MoCorena PilgrimMD

## 2015-09-14 NOTE — BH Assessment (Signed)
Patient has been accepted to [facility].  Accepting physician is Dr. Weber Cooks.  Attending Physician will be Dr. Bary Leriche.  Patient has been assigned to room 301, by [place].  Call report to [phone].  Representative/Transfer Coordinator is Shalayne Leach.  [facility] ER Staff Marcello Moores, TTS) made aware of acceptance.

## 2015-09-14 NOTE — BH Assessment (Signed)
[facility] Assessment Progress Note  Per Corena Pilgrim, MD, this pt requires psychiatric hospitalization at this time.  Per Ian Malkin, pt has been accepted to [facility] by Dr Weber Cooks.  Waylan Boga, NP concurs with this decision.  Pt is under IVC, and IVC documents have been faxed to [facility].  Pt's nurse has been notified, and agrees to call report to [phone].  Pt is to be transported via [facility].  Jalene Mullet, [facility] Triage Specialist [phone]

## 2015-09-14 NOTE — Telephone Encounter (Signed)
Marvin [name] is case Freight forwarder at [facility] ED. She called asking about the pt appt here at [facility] today. Explained the appt was for fluids. The appt is currently not on schedule.  Marvin [name] stated pt is still in ED but they are hoping to get a bed at behavioral health today. She will also inform the nurses to encourage the pt to take fluids. Marvin [name]'s phone is [phone]. It is also noted that pt has no appts currently at [facility].

## 2015-09-14 NOTE — BHH Counselor (Signed)
Patient states that he had throat cancer previously and is currently in remission. Patient states that he learned he has lung cancer and he was scheduled to start chemo today. Patient states that he is not sure how often he will need chemotherapy or radiation. Patient states that he has a feeding tube that has not been used in approximately four months. He states that he has to "shoot something clean in it like water" approximately once or twice per week and he does that and cares for it himself. Patient states that he can "Look and tell when it needs to be cleaned because it's gunked up and I'll just clean it."  Patient states that he cleans it to prevent infection.    Rosalin Hawking, LCSW Therapeutic Triage Specialist [place] 09/14/2015 11:09 AM

## 2015-09-14 NOTE — ED Notes (Signed)
Report given to [facility], [place]

## 2015-09-15 DIAGNOSIS — G893 Neoplasm related pain (acute) (chronic): Secondary | ICD-10-CM

## 2015-09-15 DIAGNOSIS — F322 Major depressive disorder, single episode, severe without psychotic features: Secondary | ICD-10-CM

## 2015-09-15 LAB — BASIC METABOLIC PANEL
Anion gap: 10 (ref 5–15)
BUN: 13 mg/dL (ref 6–20)
CALCIUM: 9.2 mg/dL (ref 8.9–10.3)
CO2: 32 mmol/L (ref 22–32)
CREATININE: 0.67 mg/dL (ref 0.61–1.24)
Chloride: 96 mmol/L — ABNORMAL LOW (ref 101–111)
GFR calc non Af Amer: >60 mL/min (ref >60)
Glucose, Bld: 109 mg/dL — ABNORMAL HIGH (ref 65–99)
Potassium: 3.3 mmol/L — ABNORMAL LOW (ref 3.5–5.1)
SODIUM: 138 mmol/L (ref 135–145)

## 2015-09-15 MED ORDER — FLUOXETINE HCL 20 MG PO CAPS
20.0000 mg | ORAL_CAPSULE | Freq: Every day | ORAL | Status: DC
  Administered 2015-09-15 – 2015-09-17 (×3): 20 mg via ORAL
  Filled 2015-09-15 (×3): qty 1

## 2015-09-15 MED ORDER — LORAZEPAM 0.5 MG PO TABS
0.5000 mg | ORAL_TABLET | Freq: Three times a day (TID) | ORAL | Status: DC
  Administered 2015-09-15 – 2015-09-17 (×6): 0.5 mg via ORAL
  Filled 2015-09-15 (×6): qty 1

## 2015-09-15 MED ORDER — ENSURE ENLIVE PO LIQD
237.0000 mL | Freq: Three times a day (TID) | ORAL | Status: DC
  Administered 2015-09-15 – 2015-09-17 (×6): 237 mL via ORAL

## 2015-09-15 MED ORDER — FENTANYL 50 MCG/HR TD PT72
50.0000 ug | MEDICATED_PATCH | TRANSDERMAL | Status: DC
Start: 2015-09-15 — End: 2015-09-17
  Administered 2015-09-15: 50 ug via TRANSDERMAL

## 2015-09-15 MED ORDER — NICOTINE 21 MG/24HR TD PT24
21.0000 mg | MEDICATED_PATCH | Freq: Every day | TRANSDERMAL | Status: DC
  Administered 2015-09-15 – 2015-09-17 (×2): 21 mg via TRANSDERMAL
  Filled 2015-09-15 (×2): qty 1

## 2015-09-15 NOTE — Plan of Care (Signed)
Problem: Ineffective individual coping Goal: STG: Patient will remain free from self harm Outcome: Progressing No self harm reported or observed     

## 2015-09-15 NOTE — Plan of Care (Signed)
Problem: Alteration in mood Goal: LTG-Patient reports reduction in suicidal thoughts (Patient reports reduction in suicidal thoughts and is able to verbalize a safety plan for whenever patient is feeling suicidal)  Outcome: Progressing Patient denies SI and verbally contracts for safety.

## 2015-09-15 NOTE — Progress Notes (Signed)
D: Patient received from [facility] ED IVC. Pt has PEG tube in lower left abdomin, that pt has not used in 3 months. Pt stated "I flushed it a week ago." Pt has port-a-cath in upper right chest. Pt has scabbed over scratch marks on lower legs from "running in the brush." No contraband found. Pt has recent lung cancer diagnosis and endorsed drinking 3-4 40's daily over the past week after being sober 6 months. Pt endorsed that he wants to stop drinking again, and has a positive outlook towards cancer treatment. Pt indicated that his appetite has been improving over the last few days. Patient alert and oriented x4. Patient denies SI/HI/AVH. Pt affect is sad and anxious. Pt endorsed feeling mildly nauseas, which increased and lead to the pt throwing up Regulatory affairs officer did not see this). Pt indicated it was because he had not had his ativan and was feeling very anxious.Pt rated depression 5/10 and anxiety 7/10. Pt c/o of pain by his port-a-cath site a times, but indicates it mostly when "I stretch the skin." Pt asked for "something to help me sleep?" A: Pt skin and contraband search performed with [facility] RN. Reviewed admission material and educated pt on unit policy. Oriented pt to unit. Offered active listening and support. Provided therapeutic communication. Called doctor about patient request for ativan and fludrocortisone, as pt takes these at home. Order received and placed. Administered scheduled medications.  R: Pt pleasant and cooperative. Pt medication compliant. Pt indicated "I feel better" and no longer nauseas after Ativan given Will continue Q15 min. checks. Safety maintained.

## 2015-09-15 NOTE — Tx Team (Signed)
Initial Interdisciplinary Treatment Plan   PATIENT STRESSORS: Health problems   PATIENT STRENGTHS: Ability for insight Average or above average intelligence General fund of knowledge Motivation for treatment/growth   PROBLEM LIST: Problem List/Patient Goals Date to be addressed Date deferred Reason deferred Estimated date of resolution  Cancer diagnosis 09/14/15     Depression 09/14/15     Suicidal Ideation 09/14/15                                          DISCHARGE CRITERIA:  Improved stabilization in mood, thinking, and/or behavior Motivation to continue treatment in a less acute level of care  PRELIMINARY DISCHARGE PLAN: Attend aftercare/continuing care group Outpatient therapy  PATIENT/FAMIILY INVOLVEMENT: This treatment plan has been presented to and reviewed with the patient, Marvin [name].  The patient and family have been given the opportunity to ask questions and make suggestions.  Laverle Patter [name] 09/15/2015, 12:36 AM

## 2015-09-15 NOTE — BHH Group Notes (Signed)
BHH LCSW Group Therapy  09/15/2015 2:16 PM  Type of Therapy:  Group Therapy  Participation Level:  Did Not Attend  Modes of Intervention:  Discussion, Education, Socialization and Support  Summary of Progress/Problems: Todays topic: Grudges  Patients will be encouraged to discuss their thoughts, feelings, and behaviors as to why one holds on to grudges and reasons why people have grudges. Patients will process the impact of grudges on their daily lives and identify thoughts and feelings related to holding grudges. Patients will identify feelings and thoughts related to what life would look like without grudges.   Srijan Givan L Fanny Agan MSW, LCSWA  09/15/2015, 2:16 PM   

## 2015-09-15 NOTE — [facility] Suicide Risk Assessment (Signed)
[facility] Admission Suicide Risk Assessment   Nursing information obtained from:    Demographic factors:    Current Mental Status:    Loss Factors:    Historical Factors:    Risk Reduction Factors:     Total Time spent with patient: 1 hour Principal Problem: Major depression ([place]) Diagnosis:   Patient Active Problem List   Diagnosis Date Noted  . Chronic pain [G89.29] 09/15/2015  . Living accommodation issues [Z59.8] 09/15/2015  . Major depression ([place]) [F32.9] 09/14/2015  . Severe major depression, single episode, without psychotic features ([place]) [F32.2] 09/13/2015  . Alcohol use disorder, severe, dependence ([place]) [F10.20] 09/13/2015  . Alcohol intoxication ([place]) [F10.129]   . Pancytopenia, acquired ([place]) [id number] 09/11/2015  . 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 Malignant neoplasm metastatic to lung ([place]) [C78.00]   . Hypotension [I95.9] 09/06/2015  . elevated lactate [E87.2] 09/06/2015  . Syncope [R55] 09/06/2015  . 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 Tracheostomy status ([place]) [Z93.0]   . Pancytopenia due to antineoplastic chemotherapy ([place]) [id number], T45.1X5A] 05/16/2015  . Anemia due to chemotherapy [D64.81, T45.1X5A] 05/11/2015  . Hypokalemia due to loss of potassium [E87.6] 05/11/2015  . Thrombocytopenia due to drugs [D69.59] 05/11/2015  . Headache [R51] 05/10/2015  . Hypokalemia [E87.6] 05/10/2015  . Hypotension, postural [I95.1] 04/10/2015  . Dehydration [E86.0] 03/28/2015  . Orthostatic hypotension [I95.1] 03/28/2015  . Mucositis due to chemotherapy [K12.31] 03/26/2015  . Chemotherapy-induced nausea [R11.0, T45.1X5A] 03/26/2015  . S/P gastrostomy ([place]) [Z93.1] 03/13/2015  . GERD (gastroesophageal reflux disease) [K21.9] 03/12/2015  . Anxiety disorder [F41.9] 03/12/2015  . Alcohol abuse [F10.10] 03/12/2015  . Tobacco abuse [Z72.0] 03/12/2015  . 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 Protein-calorie malnutrition, severe ([place]) [E43]  02/27/2015  . Dysphagia [R13.10]   . Malnutrition ([place]) [E46]   . 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 Transaminitis [R74.0] 04/19/2012  . Alcohol dependence ([place]) [F10.20] 04/17/2012   Subjective Data: Patient denies any current suicidal ideation. He does have symptoms of depression. There is no evidence of psychosis. Patient has positive things in his life he is looking forward to. He is cooperative with treatment. He does have multiple medical problems and chronic pain and some degree of social isolation as well as a history of substance abuse.  Continued Clinical Symptoms:  Alcohol Use Disorder Identification Test Final Score (AUDIT): 8 The "Alcohol Use Disorders Identification Test", Guidelines for Use in Primary Care, Second Edition.  World Pharmacologist [facility]). Score between 0-7:  no or low risk or alcohol related problems. Score between 8-15:  moderate risk of alcohol related problems. Score between 16-19:  high risk of alcohol related problems. Score 20 or above:  warrants further diagnostic evaluation for alcohol dependence and treatment.   CLINICAL FACTORS:   Depression:   Comorbid alcohol abuse/dependence Alcohol/Substance Abuse/Dependencies Medical Diagnoses and Treatments/Surgeries   Musculoskeletal: Strength & Muscle Tone: within normal limits Gait & Station: normal Patient leans: N/A  Psychiatric Specialty Exam: ROS  Blood pressure 100/85, pulse 156, temperature 98.3 F (36.8 C), temperature source Oral, resp. rate 18, height 6\' 1"  (1.854 m), weight 59.421 kg (131 lb), SpO2 100 %.Body mass index is 17.29 kg/(m^2).  General Appearance: Casual  Eye Contact::  Minimal  Speech:  Slow  Volume:  Normal  Mood:  Euthymic  Affect:  Constricted  Thought Process:  Goal Directed  Orientation:  Full (Time, Place, and Person)  Thought Content:  Negative  Suicidal Thoughts:  No  Homicidal Thoughts:  No  Memory:  Immediate;   Fair Recent;    Fair Remote;   Fair  Judgement:  Fair  Insight:  Fair  Psychomotor Activity:  Decreased  Concentration:  Fair  Recall:  [employer] of Knowledge:Fair  Language: Fair  Akathisia:  No  Handed:  Right  AIMS (if indicated):     Assets:  Communication Skills Desire for Improvement Financial Resources/Insurance Resilience Social Support  Sleep:  Number of Hours: 4  Cognition: WNL  ADL's:  Intact    COGNITIVE FEATURES THAT CONTRIBUTE TO RISK:  None    SUICIDE RISK:   Mild:  Suicidal ideation of limited frequency, intensity, duration, and specificity.  There are no identifiable plans, no associated intent, mild dysphoria and related symptoms, good self-control (both objective and subjective assessment), few other risk factors, and identifiable protective factors, including available and accessible social support.  PLAN OF CARE: Patient has some degree of risk for suicide given his depression but patient is not actively thinking of or planning suicide and to the contrary kilos evidence that he is planning for looking forward to the future. Patient will be treated for depression and his multiple medical problems on the psychiatric ward. Daily assessment. Consideration by the treatment team for appropriate treatment and he will certainly have follow-up arranged at discharge.  I certify that inpatient services furnished can reasonably be expected to improve the patient's condition.   Alethia Berthold, MD 09/15/2015, 1:37 PM

## 2015-09-15 NOTE — Progress Notes (Signed)
Pt denies SI. Calm and cooperative but anxious. Isolated to room majority of shift. Forwards little. Medications given as prescribed. Encouragement and support provided. Q15 minute checks maintained for safety. Pt receptive to care. Calm and cooperative. Affect depressed. Expressed anxiety. Voiced no other concerns. Will continue to monitor.

## 2015-09-15 NOTE — Progress Notes (Signed)
Initial Nutrition Assessment  DOCUMENTATION CODES:   Severe malnutrition in context of chronic illness  INTERVENTION:   Medical Food Supplement Therapy: recommend addition of Ensure Enlive po TID, each supplement provides 350 kcal and 20 grams of protein and Magic cup BID, both of which pt has utilized in the past Meals and Snacks: agree with no dietary restrictions but will monitor diet for tolerance; pt with issues with swallowing/chewing in the past due to hx of throat cancer s/p treatment. May benefit from downgrading diet to Dysphagia III or lower if issues with swallowing/chewing present on this admission. In the mean time, cater to pt preferences to best meet needs Coordination of Care: pt with PEG tube in place, does not use per report. May benefit from flushing q shift to maintain patency unless removal is planned; may benefit from keeping PEG tube to supplement nutrition in future to maximize nutrition especially with recent dx of lung cancer, already meets clinical characteristics for malnutrition  NUTRITION DIAGNOSIS:   Malnutrition related to chronic illness as evidenced by severe depletion of body fat, severe depletion of muscle mass.   GOAL:   Patient will meet greater than or equal to 90% of their needs   MONITOR:    (Energy Intake, Anthropometrics, Digestive System,)  REASON FOR ASSESSMENT:   Malnutrition Screening Tool    ASSESSMENT:   Pt transferred from [facility] ED for depression,, SI,  Alcohol use disorder-severe, dependence. Pt with recent lung cancer, pt has been drinking 3-4 40 ounce beers daily over the past week after period of sobriety. Pt with met criteria for  severe malnutrition in context of chronic illness on assessment by RD at [facility] on 09/07/15  Past Medical History  Diagnosis Date  . ETOH abuse   . Alcohol dependence ([place]) 04/17/2012  . COPD (chronic obstructive pulmonary disease) ([place])   . Shortness of breath dyspnea     occ  .  Anxiety   . GERD (gastroesophageal reflux disease)   . Seizures ([place])     one episode 09/2012, suspected related to ETOH withdrawal  . Anxiety disorder 03/12/2015  . Headache 05/10/2015  . Hypokalemia 05/10/2015  . 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 Cancer ([place])   . Throat cancer ([place])   . Severe major depression, single episode, without psychotic features ([place]) 09/13/2015   Past Surgical History  Procedure Laterality Date  . Knee surgery Left     kneecap -screws  . Appendectomy    . Tracheostomy tube placement N/A 02/22/2015    Procedure: TRACHEOSTOMY;  Surgeon: [name], MD;  Location: [facility];  Service: ENT;  Laterality: N/A;  awake tracheostomy  . Direct laryngoscopy N/A 02/22/2015    Procedure: DIRECT LARYNGOSCOPY;  Surgeon: [name], MD;  Location: [place];  Service: ENT;  Laterality: N/A;  direct laryngoscopy with biopsy  . Multiple extractions with alveoloplasty N/A 03/05/2015    Procedure: Extraction of tooth #'s 1,2,8,9,15,16,17,18,31,32 with alveoloplasty, mandibular right lateral exostoses, and gross debridement of remaining teeth;  Surgeon: Lenn Cal, DDS;  Location: [facility];  Service: Oral Surgery;  Laterality: N/A;     Diet Order:  Diet regular Room service appropriate?: Yes; Fluid consistency:: Thin   Energy Intake: ate 100% at breakfast this AM, appetite fair at present  Skin:  Reviewed, no issues  Nutrition Focused Physical Exam: Nutrition-Focused physical exam completed. Findings are moderate to severe fat depletion, moderate to severe muscle depletion, and no edema.   Height:   Ht Readings  from Last 1 Encounters:  09/14/15 '6\' 1"'  (1.854 m)    Weight:   Wt Readings from Last 1 Encounters:  09/14/15 131 lb (59.421 kg)    Wt Readings from Last 10 Encounters:  09/14/15 131 lb (59.421 kg)  09/10/15 139 lb 4.8 oz (63.186 kg)  09/05/15 140 lb (63.504 kg)  08/30/15 140 lb (63.504 kg)  08/24/15 138 lb (62.596 kg)  08/17/15 137 lb 8 oz (62.37 kg)   08/16/15 136 lb (61.689 kg)  07/23/15 135 lb 6.4 oz (61.417 kg)  07/18/15 138 lb 3.2 oz (62.687 kg)  07/16/15 132 lb 9.6 oz (60.147 kg)    BMI:  Body mass index is 17.29 kg/(m^2).  Estimated Nutritional Needs:   Kcal:  2100-2520 kcals (25-30 kcals/kg) using IBW of 84 kg to promote wt gain  Protein:  84-101 g (1.0-1.2 g/kg)   Fluid:  2100-2520 mL (25-30 ml/kg)   EDUCATION NEEDS:     Bram[place], RD, LDN [phone] Pager  [phone] Weekend/On-Call Pager

## 2015-09-15 NOTE — Progress Notes (Signed)
Pt reports slept well last pm after sleep medication given. Describes appetite as fair and energy level normal. Rates depression at 2, hopelessness at 0 and anxiety at 2. Denies SI, HI, AVH. States goal for today is to get better. Pt reports having lung cancer and unsure as to when chemo will start. Encouragement and support offered. Pt has been attending group. Remains safe on unit with q 15 min checks.

## 2015-09-15 NOTE — H&P (Signed)
Psychiatric Admission Assessment Adult  Patient Identification: Ryer Kerwick MRN:  [id number] Date of Evaluation:  09/15/2015 Chief Complaint:  Depression Principal Diagnosis: Major depression ([place]) Diagnosis:   Patient Active Problem List   Diagnosis Date Noted  . Chronic pain [G89.29] 09/15/2015  . Living accommodation issues [Z59.8] 09/15/2015  . Major depression ([place]) [F32.9] 09/14/2015  . Severe major depression, single episode, without psychotic features ([place]) [F32.2] 09/13/2015  . Alcohol use disorder, severe, dependence ([place]) [F10.20] 09/13/2015  . Alcohol intoxication ([place]) [F10.129]   . Pancytopenia, acquired ([place]) [id number] 09/11/2015  . 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 Hypotension [I95.9] 09/06/2015  . elevated lactate [E87.2] 09/06/2015  . Syncope [R55] 09/06/2015  . 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 Tracheostomy status ([place]) [Z93.0]   . Pancytopenia due to antineoplastic chemotherapy ([place]) [id number], T45.1X5A] 05/16/2015  . Anemia due to chemotherapy [D64.81, T45.1X5A] 05/11/2015  . Hypokalemia due to loss of potassium [E87.6] 05/11/2015  . Thrombocytopenia due to drugs [D69.59] 05/11/2015  . Headache [R51] 05/10/2015  . Hypokalemia [E87.6] 05/10/2015  . Hypotension, postural [I95.1] 04/10/2015  . Dehydration [E86.0] 03/28/2015  . Orthostatic hypotension [I95.1] 03/28/2015  . Mucositis due to chemotherapy [K12.31] 03/26/2015  . Chemotherapy-induced nausea [R11.0, T45.1X5A] 03/26/2015  . S/P gastrostomy ([place]) [Z93.1] 03/13/2015  . GERD (gastroesophageal reflux disease) [K21.9] 03/12/2015  . Anxiety disorder [F41.9] 03/12/2015  . Alcohol abuse [F10.10] 03/12/2015  . Tobacco abuse [Z72.0] 03/12/2015  . 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 Protein-calorie malnutrition, severe ([place]) [E43] 02/27/2015  . Dysphagia [R13.10]   . Malnutrition ([place]) [E46]   . 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 Transaminitis [R74.0] 04/19/2012  . Alcohol dependence ([place]) [F10.20] 04/17/2012   History of Present Illness:: 40 year old man transferred from [facility]. He had been there for several days for alcohol withdrawal and medical stabilization. Patient presented there over a week ago after leaving his nursing home and drinking for several days. Patient had continued to report depression during his time at [facility]. Patient continues to describe himself as depressed. His sleep is poor. He feels very nervous and anxious all the time. He is worried a great deal about his health problems. He reports the acute stress was learning that he had new disease possibly metastases in his lungs and was going to need more cancer treatment. This got him very upset. He continues to worry about that. Patient has now been several days off of alcohol. He still feels nervous and jittery and can get some visual hallucinations at night but none during the day. Although he describes himself as depressed he denies any suicidal wish or intent or plan. Does not feel hopeless. He is able to articulate positive things to live for. Patient has multiple physical problems including the cancer, the residual feeding tube in his stomach although he no longer apparently needs to use it, the weight loss he continues to have since his cancer treatment, recurrent hypokalemia which was still in a problem as of a few days ago. Recurrent hypotension which is being treated with cortisone. Chronic pain for which she was on a fentanyl patch, chronic anxiety.  Social history: Patient had been living at a skilled nursing facility. He left there abruptly and went out drinking. He is unclear whether he will be able to return there but he would very much like to. Seems like he has a good relationship with the rest of  his family however.  Substance abuse history: Patient describes decades of alcohol abuse.  He's had treatment in the past but it had been years since he had last been sober until he got cancer treatment. He said he had been doing well in the nursing home until just this past week. He does have a past history of DTs and seizures. Denies any history of abuse of other drugs.  Medical history: See above multiple problems. Throat cancer, now possible return of disease in the lungs. Low potassium. Chronic pain. Weight loss, low blood pressure Associated Signs/Symptoms: Depression Symptoms:  depressed mood, insomnia, psychomotor retardation, difficulty concentrating, anxiety, loss of energy/fatigue, disturbed sleep, weight loss, (Hypo) Manic Symptoms:  None Anxiety Symptoms:  Excessive Worry, Panic Symptoms, Psychotic Symptoms:  None PTSD Symptoms: Negative Total Time spent with patient: 1 hour  Past Psychiatric History: Patient claims that he had never had any psychiatric treatment other than for alcohol abuse in the past. No past history of suicide attempts. He had been unaware that he had been treated for depression although review of the notes looks like he was on citalopram at his nursing home. According to the last psychiatric note he was supposed to be on Prozac at [facility] but somehow it seems to of gotten lost in transition. No history of suicide attempts no history of mania and no history of psychosis.  Risk to Self: Is patient at risk for suicide?: No Risk to Others:   Prior Inpatient Therapy:   Prior Outpatient Therapy:    Alcohol Screening: Patient refused Alcohol Screening Tool: Yes 1. How often do you have a drink containing alcohol?: Monthly or less 2. How many drinks containing alcohol do you have on a typical day when you are drinking?: 3 or 4 3. How often do you have six or more drinks on one occasion?: Less than monthly Preliminary Score: 2 4. How often during the last year have you found that you were not able to stop drinking once you had started?:  Never 5. How often during the last year have you failed to do what was normally expected from you becasue of drinking?: Less than monthly 6. How often during the last year have you needed a first drink in the morning to get yourself going after a heavy drinking session?: Less than monthly 7. How often during the last year have you had a feeling of guilt of remorse after drinking?: Never 8. How often during the last year have you been unable to remember what happened the night before because you had been drinking?: Less than monthly 9. Have you or someone else been injured as a result of your drinking?: No 10. Has a relative or friend or a doctor or another health worker been concerned about your drinking or suggested you cut down?: Yes, but not in the last year Alcohol Use Disorder Identification Test Final Score (AUDIT): 8 Brief Intervention: Yes Substance Abuse History in the last 12 months:  Yes.   Consequences of Substance Abuse: Medical Consequences:  Complications related to his current cancer treatment Withdrawal Symptoms:   Tremors Previous Psychotropic Medications: Yes  Psychological Evaluations: Yes  Past Medical History:  Past Medical History  Diagnosis Date  . ETOH abuse   . Alcohol dependence ([place]) 04/17/2012  . COPD (chronic obstructive pulmonary disease) ([place])   . Shortness of breath dyspnea     occ  . Anxiety   . GERD (gastroesophageal reflux disease)   . Seizures ([place])  one episode 09/2012, suspected related to ETOH withdrawal  . Anxiety disorder 03/12/2015  . Headache 05/10/2015  . Hypokalemia 05/10/2015  . 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 Cancer ([place])   . Throat cancer ([place])   . Severe major depression, single episode, without psychotic features ([place]) 09/13/2015    Past Surgical History  Procedure Laterality Date  . Knee surgery Left     kneecap -screws  . Appendectomy    . Tracheostomy tube placement N/A 02/22/2015    Procedure: TRACHEOSTOMY;  Surgeon:  [name], MD;  Location: [facility];  Service: ENT;  Laterality: N/A;  awake tracheostomy  . Direct laryngoscopy N/A 02/22/2015    Procedure: DIRECT LARYNGOSCOPY;  Surgeon: [name], MD;  Location: [facility];  Service: ENT;  Laterality: N/A;  direct laryngoscopy with biopsy  . Multiple extractions with alveoloplasty N/A 03/05/2015    Procedure: Extraction of tooth #'s 1,2,8,9,15,16,17,18,31,32 with alveoloplasty, mandibular right lateral exostoses, and gross debridement of remaining teeth;  Surgeon: Lenn Cal, DDS;  Location: [facility];  Service: Oral Surgery;  Laterality: N/A;   Family History:  Family History  Problem Relation Age of Onset  . Cancer Mother   . Heart attack Maternal Grandmother     age of death 79  . Heart attack Maternal Grandfather   . Cancer Paternal Grandfather    Family Psychiatric  History: Patient reports multiple members of his family had mental health problems but he is at a loss to describe it. He says several people I had psychotic symptoms. His father had an alcohol abuse problem. He is not aware of any history of suicide Social History:  History  Alcohol Use  . 0.0 oz/week  . 0 Standard drinks or equivalent per week    Comment: 6 -40-oz. beers daily     History  Drug Use No    Social History   Social History  . Marital Status: Single    Spouse Name: N/A  . Number of Children: N/A  . Years of Education: N/A   Social History Main Topics  . Smoking status: Current Every Day Smoker -- 1.00 packs/day for 28 years    Types: Cigarettes    Last Attempt to Quit: 01/17/2015  . Smokeless tobacco: Never Used  . Alcohol Use: 0.0 oz/week    0 Standard drinks or equivalent per week     Comment: 6 -40-oz. beers daily  . Drug Use: No  . Sexual Activity: No   Other Topics Concern  . None   Social History Narrative   Epworth sleepiness scale as of 07/16/15 14   Additional Social History:                         Allergies:  No Known  Allergies Lab Results: No results found for this or any previous visit (from the past 48 hour(s)).  Metabolic Disorder Labs:  No results found for: HGBA1C, MPG No results found for: PROLACTIN Lab Results  Component Value Date   CHOL 105 07/19/2015   TRIG 75 07/19/2015   HDL 34* 07/19/2015   LDLCALC 56 07/19/2015    Current Medications: Current Facility-Administered Medications  Medication Dose Route Frequency Provider Last Rate Last Dose  . acetaminophen (TYLENOL) tablet 650 mg  650 mg Oral Q6H PRN Gonzella Lex, MD      . alum & mag hydroxide-simeth (MAALOX/MYLANTA) 200-200-20 MG/5ML suspension 30 mL  30 mL Oral Q4H PRN Gonzella Lex, MD      .  feeding supplement (ENSURE ENLIVE) (ENSURE ENLIVE) liquid 237 mL  237 mL Oral TID WC Jolanta B Pucilowska, MD   237 mL at 09/15/15 1255  . fentaNYL (DURAGESIC - dosed mcg/hr) 50 mcg  50 mcg Transdermal Q72H Gonzella Lex, MD      . fludrocortisone (FLORINEF) tablet 0.2 mg  0.2 mg Oral Daily Gonzella Lex, MD   0.2 mg at 09/15/15 0820  . FLUoxetine (PROZAC) capsule 20 mg  20 mg Oral Daily Dakhari Zuver T Angeliki Mates, MD      . LORazepam (ATIVAN) tablet 0.5 mg  0.5 mg Oral TID Gonzella Lex, MD      . magnesium hydroxide (MILK OF MAGNESIA) suspension 30 mL  30 mL Oral Daily PRN Gonzella Lex, MD      . nicotine (NICODERM CQ - dosed in mg/24 hours) patch 21 mg  21 mg Transdermal Daily Gonzella Lex, MD   21 mg at 09/15/15 0820  . traZODone (DESYREL) tablet 100 mg  100 mg Oral QHS PRN Gonzella Lex, MD   100 mg at 09/14/15 2354   Facility-Administered Medications Ordered in Other Encounters  Medication Dose Route Frequency Provider Last Rate Last Dose  . 0.9 %  sodium chloride infusion   Intravenous Once [name], MD       PTA Medications: Prescriptions prior to admission  Medication Sig Dispense Refill Last Dose  . fludrocortisone (FLORINEF) 0.1 MG tablet Take 2 tablets by mouth daily.  0 Past Week at Unknown time  . LORazepam (ATIVAN) 0.5 MG  tablet Take 1 tablet (0.5 mg total) by mouth 2 (two) times daily. 14 tablet 0 09/14/2015 at Unknown time  . thiamine 100 MG tablet Take 1 tablet (100 mg total) by mouth daily. (Patient not taking: Reported on 09/12/2015) 30 tablet 0 Not Taking at Unknown time    Musculoskeletal: Strength & Muscle Tone: decreased Gait & Station: normal Patient leans: N/A  Psychiatric Specialty Exam: Physical Exam  Nursing note and vitals reviewed. Constitutional: He appears cachectic.  HENT:  Head: Normocephalic and atraumatic.  Patient complains of throat pain chronic since his cancer  Eyes: Conjunctivae are normal. Pupils are equal, round, and reactive to light.  Neck: Normal range of motion.  Cardiovascular: Normal rate, regular rhythm and normal heart sounds.   Respiratory: Effort normal and breath sounds normal.  GI: Soft.  Patient has a feeding tube in place through his stomach. It appears to be intact no sign of infection. According to him he is no longer needing to use it regularly.  Musculoskeletal: Normal range of motion.  Neurological: He is alert.  Skin: Skin is warm and dry.  Psychiatric: His speech is normal. Judgment and thought content normal. He is slowed. Cognition and memory are normal. He exhibits a depressed mood.    Review of Systems  Constitutional: Positive for malaise/fatigue.  HENT: Positive for sore throat.   Eyes: Negative.   Respiratory: Negative.   Cardiovascular: Negative.   Gastrointestinal: Negative.   Musculoskeletal: Negative.   Skin: Negative.   Neurological: Negative.   Psychiatric/Behavioral: Positive for depression, hallucinations and substance abuse. Negative for suicidal ideas and memory loss. The patient is nervous/anxious. The patient does not have insomnia.     Blood pressure 100/85, pulse 156, temperature 98.3 F (36.8 C), temperature source Oral, resp. rate 18, height 6\' 1"  (1.854 m), weight 59.421 kg (131 lb), SpO2 100 %.Body mass index is 17.29  kg/(m^2).  General Appearance: Casual  Eye Contact::  Minimal  Speech:  Slow  Volume:  Decreased  Mood:  Depressed  Affect:  Constricted  Thought Process:  Goal Directed  Orientation:  Full (Time, Place, and Person)  Thought Content:  Negative  Suicidal Thoughts:  No  Homicidal Thoughts:  No  Memory:  Immediate;   Good Recent;   Fair Remote;   Fair  Judgement:  Fair  Insight:  Fair  Psychomotor Activity:  Decreased  Concentration:  Fair  Recall:  [employer] of Knowledge:Fair  Language: Fair  Akathisia:  No  Handed:  Right  AIMS (if indicated):     Assets:  Desire for Improvement Financial Resources/Insurance Resilience Social Support  ADL's:  Intact  Cognition: WNL  Sleep:  Number of Hours: 4     Treatment Plan Summary: Daily contact with patient to assess and evaluate symptoms and progress in treatment, Medication management and Plan Patient was admitted to the psychiatry ward at the recommendation of psychiatry at [facility]. To my evaluation today he does give multiple symptoms of depression but is denying any suicidal ideation. There is no evidence of acute active psychosis. Patient seems to be mostly concerned with getting treatment for his medical problems. Plan will be to continue current psychiatric hospitalization with 15 minute checks. For his medical issues we will continue his cortisone for his blood pressure and check his vital signs regularly, I'm going to order lab so we can recheck his potassium and other basic chemistries, for his pain he will be started back on the fentanyl 50 g patch that he had previously. For his chronic anxiety we will put him back on Ativan 0.5 mg 3 times a day which had previously been effective. Because of his cancer history I will request an oncology consult although it's likely I think that we will not need active treatment during what may be a brief hospitalization here. For his depression and I will continue the fluoxetine 20 mg  recommended at [facility]. Patient advised of this and agrees to the plan. Case discussed with nursing.  Observation Level/Precautions:  15 minute checks  Laboratory:  Chemistry Profile  Psychotherapy:  Daily individual and group as usual   Medications:  See note above. Both for his medical problems and restarting fluoxetine   Consultations:  Oncology to clarify his situation and path for word   Discharge Concerns:  He will need to have a stable place to live. He hopes he can go back to his skilled nursing facility   Estimated LOS: 3-4 days   Other:     I certify that inpatient services furnished can reasonably be expected to improve the patient's condition.   Dazia Lippold 1/28/20171:27 PM

## 2015-09-15 NOTE — [facility] Group Notes (Signed)
[facility] Group Notes:  (Nursing/MHT/Case Management/Adjunct)  Date:  09/15/2015  Time:  12:06 PM  Type of Therapy:  Group Therapy  Participation Level:  Active  Participation Quality:  Appropriate, Attentive, Sharing and Supportive  Affect:  Appropriate  Cognitive:  Alert, Appropriate and Oriented  Insight:  Appropriate  Engagement in Group:  Engaged and Supportive  Modes of Intervention:  Discussion and Problem-solving  Summary of Progress/Problems:  Markavious Micco De'Chelle Khayman Kirsch 09/15/2015, 12:06 PM

## 2015-09-16 NOTE — Progress Notes (Signed)
[facility] MD Progress Note  09/16/2015 3:13 PM Marvin [name]  MRN:  [id number] Subjective:  Follow-up note for 40 year old man with a history of depression and alcohol abuse and cancer. Transferred from outside hospital because of concerns about suicidality. Patient's chief complaint "I'm feeling much better". Patient states that his mood has improved today. Denies suicidal ideation. Feels optimistic and hopeful about the future. He is not having acute symptoms of alcohol withdrawal. He is not having major physical symptoms. His pain is under pretty good control. Principal Problem: Major depression ([place]) Diagnosis:   Patient Active Problem List   Diagnosis Date Noted  . Chronic pain [G89.29] 09/15/2015  . Living accommodation issues [Z59.8] 09/15/2015  . Major depression ([place]) [F32.9] 09/14/2015  . Severe major depression, single episode, without psychotic features ([place]) [F32.2] 09/13/2015  . Alcohol use disorder, severe, dependence ([place]) [F10.20] 09/13/2015  . Alcohol intoxication ([place]) [F10.129]   . Pancytopenia, acquired ([place]) [U23.536] 09/11/2015  . 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 Malignant neoplasm metastatic to lung ([place]) [C78.00]   . Hypotension [I95.9] 09/06/2015  . elevated lactate [E87.2] 09/06/2015  . Syncope [R55] 09/06/2015  . 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 Tracheostomy status ([place]) [Z93.0]   . Pancytopenia due to antineoplastic chemotherapy ([place]) [R44.315, T45.1X5A] 05/16/2015  . Anemia due to chemotherapy [D64.81, T45.1X5A] 05/11/2015  . Hypokalemia due to loss of potassium [E87.6] 05/11/2015  . Thrombocytopenia due to drugs [D69.59] 05/11/2015  . Headache [R51] 05/10/2015  . Hypokalemia [E87.6] 05/10/2015  . Hypotension, postural [I95.1] 04/10/2015  . Dehydration [E86.0] 03/28/2015  . Orthostatic hypotension [I95.1] 03/28/2015  . Mucositis due to chemotherapy [K12.31] 03/26/2015  . Chemotherapy-induced nausea [R11.0, T45.1X5A]  03/26/2015  . S/P gastrostomy ([place]) [Z93.1] 03/13/2015  . GERD (gastroesophageal reflux disease) [K21.9] 03/12/2015  . Anxiety disorder [F41.9] 03/12/2015  . Alcohol abuse [F10.10] 03/12/2015  . Tobacco abuse [Z72.0] 03/12/2015  . 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 Protein-calorie malnutrition, severe ([place]) [E43] 02/27/2015  . Dysphagia [R13.10]   . Malnutrition ([place]) [E46]   . 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 Transaminitis [R74.0] 04/19/2012  . Alcohol dependence ([place]) [F10.20] 04/17/2012   Total Time spent with patient: 30 minutes  Past Psychiatric History: Patient has a history of depression and alcohol abuse  Past Medical History:  Past Medical History  Diagnosis Date  . ETOH abuse   . Alcohol dependence ([place]) 04/17/2012  . COPD (chronic obstructive pulmonary disease) ([place])   . Shortness of breath dyspnea     occ  . Anxiety   . GERD (gastroesophageal reflux disease)   . Seizures ([place])     one episode 09/2012, suspected related to ETOH withdrawal  . Anxiety disorder 03/12/2015  . Headache 05/10/2015  . Hypokalemia 05/10/2015  . 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 Cancer ([place])   . Throat cancer ([place])   . Severe major depression, single episode, without psychotic features ([place]) 09/13/2015    Past Surgical History  Procedure Laterality Date  . Knee surgery Left     kneecap -screws  . Appendectomy    . Tracheostomy tube placement N/A 02/22/2015    Procedure: TRACHEOSTOMY;  Surgeon: [name], MD;  Location: [facility];  Service: ENT;  Laterality: N/A;  awake tracheostomy  . Direct laryngoscopy N/A 02/22/2015    Procedure: DIRECT LARYNGOSCOPY;  Surgeon: [name], MD;  Location: [facility];  Service: ENT;  Laterality: N/A;  direct laryngoscopy with biopsy  . Multiple extractions with alveoloplasty N/A  03/05/2015    Procedure: Extraction of tooth #'s 1,2,8,9,15,16,17,18,31,32 with alveoloplasty, mandibular right lateral exostoses, and gross debridement  of remaining teeth;  Surgeon: Lenn Cal, DDS;  Location: [facility];  Service: Oral Surgery;  Laterality: N/A;   Family History:  Family History  Problem Relation Age of Onset  . Cancer Mother   . Heart attack Maternal Grandmother     age of death 24  . Heart attack Maternal Grandfather   . Cancer Paternal Grandfather    Family Psychiatric  History: Family history positive for substance abuse Social History:  History  Alcohol Use  . 0.0 oz/week  . 0 Standard drinks or equivalent per week    Comment: 6 -40-oz. beers daily     History  Drug Use No    Social History   Social History  . Marital Status: Single    Spouse Name: N/A  . Number of Children: N/A  . Years of Education: N/A   Social History Main Topics  . Smoking status: Current Every Day Smoker -- 1.00 packs/day for 28 years    Types: Cigarettes    Last Attempt to Quit: 01/17/2015  . Smokeless tobacco: Never Used  . Alcohol Use: 0.0 oz/week    0 Standard drinks or equivalent per week     Comment: 6 -40-oz. beers daily  . Drug Use: No  . Sexual Activity: No   Other Topics Concern  . None   Social History Narrative   Epworth sleepiness scale as of 07/16/15 14   Additional Social History:                         Sleep: Fair  Appetite:  Fair  Current Medications: Current Facility-Administered Medications  Medication Dose Route Frequency Provider Last Rate Last Dose  . acetaminophen (TYLENOL) tablet 650 mg  650 mg Oral Q6H PRN Gonzella Lex, MD      . alum & mag hydroxide-simeth (MAALOX/MYLANTA) 200-200-20 MG/5ML suspension 30 mL  30 mL Oral Q4H PRN Gonzella Lex, MD      . feeding supplement (ENSURE ENLIVE) (ENSURE ENLIVE) liquid 237 mL  237 mL Oral TID WC Jolanta B Pucilowska, MD   237 mL at 09/16/15 1254  . fentaNYL (DURAGESIC - dosed mcg/hr) 50 mcg  50 mcg Transdermal Q72H Gonzella Lex, MD   50 mcg at 09/15/15 1436  . fludrocortisone (FLORINEF) tablet 0.2 mg  0.2 mg Oral Daily Gonzella Lex, MD   0.2 mg at 09/16/15 0917  . FLUoxetine (PROZAC) capsule 20 mg  20 mg Oral Daily Gonzella Lex, MD   20 mg at 09/16/15 0917  . LORazepam (ATIVAN) tablet 0.5 mg  0.5 mg Oral TID Gonzella Lex, MD   0.5 mg at 09/16/15 0917  . magnesium hydroxide (MILK OF MAGNESIA) suspension 30 mL  30 mL Oral Daily PRN Gonzella Lex, MD      . nicotine (NICODERM CQ - dosed in mg/24 hours) patch 21 mg  21 mg Transdermal Daily Gonzella Lex, MD   21 mg at 09/15/15 0820  . traZODone (DESYREL) tablet 100 mg  100 mg Oral QHS PRN Gonzella Lex, MD   100 mg at 09/14/15 2354   Facility-Administered Medications Ordered in Other Encounters  Medication Dose Route Frequency Provider Last Rate Last Dose  . 0.9 %  sodium chloride infusion   Intravenous Once [name], MD  Lab Results:  Results for orders placed or performed during the hospital encounter of 09/14/15 (from the past 48 hour(s))  Basic metabolic panel     Status: Abnormal   Collection Time: 09/15/15  2:30 PM  Result Value Ref Range   Sodium 138 135 - 145 mmol/L   Potassium 3.3 (L) 3.5 - 5.1 mmol/L   Chloride 96 (L) 101 - 111 mmol/L   CO2 32 22 - 32 mmol/L   Glucose, Bld 109 (H) 65 - 99 mg/dL   BUN 13 6 - 20 mg/dL   Creatinine, Ser 0.67 0.61 - 1.24 mg/dL   Calcium 9.2 8.9 - 10.3 mg/dL   GFR calc non Af Amer >60 >60 mL/min   GFR calc Af Amer >60 >60 mL/min    Comment: (NOTE) The eGFR has been calculated using the CKD EPI equation. This calculation has not been validated in all clinical situations. eGFR's persistently <60 mL/min signify possible Chronic Kidney Disease.    Anion gap 10 5 - 15    Physical Findings: AIMS: Facial and Oral Movements Muscles of Facial Expression: None, normal Lips and Perioral Area: None, normal Jaw: None, normal Tongue: None, normal,Extremity Movements Upper (arms, wrists, hands, fingers): None, normal Lower (legs, knees, ankles, toes): None, normal,  , Overall Severity Severity of abnormal  movements (highest score from questions above): None, normal Incapacitation due to abnormal movements: None, normal Patient's awareness of abnormal movements (rate only patient's report): No Awareness, Dental Status Current problems with teeth and/or dentures?: No Does patient usually wear dentures?: No  CIWA:  CIWA-Ar Total: 5 COWS:     Musculoskeletal: Strength & Muscle Tone: within normal limits Gait & Station: normal Patient leans: N/A  Psychiatric Specialty Exam: Review of Systems  Constitutional: Negative.   HENT: Negative.   Eyes: Negative.   Respiratory: Negative.   Cardiovascular: Negative.   Gastrointestinal: Negative.   Musculoskeletal: Negative.   Skin: Negative.   Neurological: Negative.   Psychiatric/Behavioral: Negative for depression, suicidal ideas, hallucinations, memory loss and substance abuse. The patient is nervous/anxious. The patient does not have insomnia.     Blood pressure 130/105, pulse 101, temperature 97.5 F (36.4 C), temperature source Oral, resp. rate 18, height 6' 1" (1.854 m), weight 59.421 kg (131 lb), SpO2 100 %.Body mass index is 17.29 kg/(m^2).  General Appearance: Casual  Eye Contact::  Good  Speech:  Clear and Coherent  Volume:  Normal  Mood:  Euthymic  Affect:  Congruent  Thought Process:  Goal Directed  Orientation:  Full (Time, Place, and Person)  Thought Content:  Negative  Suicidal Thoughts:  No  Homicidal Thoughts:  No  Memory:  Immediate;   Good Recent;   Fair Remote;   Fair  Judgement:  Fair  Insight:  Fair  Psychomotor Activity:  Normal  Concentration:  Negative  Recall:  Negative  Fund of Knowledge:Good  Language: Good  Akathisia:  No  Handed:  Right  AIMS (if indicated):     Assets:  Desire for Improvement Housing Resilience Social Support  ADL's:  Intact  Cognition: WNL  Sleep:  Number of Hours: 7   Treatment Plan Summary: Daily contact with patient to assess and evaluate symptoms and progress in  treatment, Medication management and Plan Patient is tolerating current medicine. Mood is improved. Not having alcohol withdrawal symptoms. Pain under good control. Blood pressure and pulse are normal. Patient has good insight and is motivated to get cancer treatment. Case reviewed with nursing and social work. We will  try and hopefully get him out of the hospital as soon as we can either back to his assisted living facility or to live with family so that he can get cancer treatment. Oncology consult is still pending.  John Clapacs 09/16/2015, 3:13 PM

## 2015-09-16 NOTE — Progress Notes (Signed)
Pt calm and cooperative. Seen interacting appropriately with peers in milieu. Denies SI. Denies pain Medications given as prescribed. Encouragement and support provided. Q15 minute checks maintained for safety Pt receptive to care. Cooperative and calm. Medication compliant. Will continue to monitor.

## 2015-09-16 NOTE — [facility] Group Notes (Signed)
[facility] LCSW Group Therapy  09/16/2015 3:59 PM  Type of Therapy:  Group Therapy  Participation Level:  Did Not Attend  Modes of Intervention:  Discussion, Education, Socialization and Support  Summary of Progress/Problems: Mindfulness: Patient discussed mindfulness and relaxing techniques and why they are beneficial. Pt discussed ways to incorporate mindfulness in their lives. Pt practiced a mindfulness techique and discussed how it made them feel.   Leiann Sporer L Cariah Salatino MSW, LCSWA  09/16/2015, 3:59 PM  

## 2015-09-16 NOTE — B[facility] Counselor (Signed)
Adult Comprehensive Assessment  Patient ID: Marvin [name], male   DOB: Mar 18, 1976, 40 y.o.   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  Family Relationships: None reported  Financial / Lack of resources (include bankruptcy): Limited income.  Housing / Lack of housing: Pt was living at an ALF for the last 6 months.  Physical health (include injuries & life threatening diseases): Pt was diagnosed with throat cancer and had surgery to remove it. However, recent screening determined that he has lung cancer.  Social relationships: None reported  Substance abuse: Pt reports binge drinking prior to admission.  Bereavement / Loss: None reported.   Living/Environment/Situation:  Living Arrangements: Other (Comment) (ALF) Living conditions (as described by patient or guardian): "It's ok"  How long has patient lived in current situation?: 6 months  What is atmosphere in current home: Comfortable, Supportive  Family History:  Marital status: Single Are you sexually active?: No What is your sexual orientation?: Heterosexual  Has your sexual activity been affected by drugs, alcohol, medication, or emotional stress?: None reported  Does patient have children?: No  Childhood History:  By whom was/is the patient raised?: Mother, Grandparents Description of patient's relationship with caregiver when they were a child: Good relationship with mother and grandparents  Patient's description of current relationship with people who raised him/her: Grandparents died many years ago. Good relationship with mother.  How were you disciplined when you got in trouble as a child/adolescent?: None reported  Does patient have siblings?: No Did patient suffer any verbal/emotional/physical/sexual abuse as a child?: No Did patient suffer from  severe childhood neglect?: No Has patient ever been sexually abused/assaulted/raped as an adolescent or adult?: No Was the patient ever a victim of a crime or a disaster?: No Witnessed domestic violence?: No Has patient been effected by domestic violence as an adult?: No  Education:  Highest grade of school patient has completed: 8th grade Currently a student?: No Learning disability?: No  Employment/Work Situation:   Employment situation: On disability Why is patient on disability: Cancer  How long has patient been on disability: 6 months  Patient's job has been impacted by current illness: Yes Describe how patient's job has been impacted: Unable to work due to treatments  What is the longest time patient has a held a job?: "all my life"  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  If attempted suicide, did drugs/alcohol play a role in this?: No Alcohol/Substance Abuse Treatment Hx: Past Tx, Inpatient, Attends AA/NA, Past detox If yes, describe treatment: [facility], another facility in [place].  Has alcohol/substance abuse ever caused legal problems?: Yes (stealing cars )  Social Support System:   Patient's Community Support System: Manufacturing engineer System: family, cancer center staff Type of faith/religion: Baptist  How does patient's faith help to cope with current illness?: "helps me keep my faith"   Leisure/Recreation:   Leisure and Hobbies: fishing, riding bikes   Strengths/Needs:   What things does the patient do well?: working  In what areas does patient struggle / problems  for patient: health problems, drinking alcohol,  depression   Discharge Plan:   Does patient have access to transportation?: Yes Will patient be returning to same living situation after discharge?: No Plan for living situation after discharge: Pt states is unsure if he can return to ALF but states his brother will allow him to stay.  Currently receiving community mental health services: No If no, would patient like referral for services when discharged?: Yes (What county[place] ) Does patient have financial barriers related to discharge medications?: No  Summary/Recommendations:    Patient is a 40 year old male admitted to with a diagnosis of Major Depression. Patient presented to the hospital with alcohol use and depression. Patient reports primary triggers for admission were recent cancer diagnosis. Pt was living at [facility] prior to admission. He reports taking off for a few days after learning of his cancer diagnosis. He reports binge drinking during the few days he was away from his ALF. He reports drinking 2-3 40 oz beers. He reports a history of alcohol abuse but since being placed into the ALF, he no longer had access. He is unsure if he can return to [facility]. Pt is concerned about his upcoming cancer treatment. He was supposed to start chemo but was hospitalized. Pt plans to stay with brother upon discharge. He does not have an outpatient provider but would like a referral; especially for counseling. Patient will benefit from crisis stabilization, medication evaluation, group therapy and psycho education in addition to case management for discharge. At discharge, it is recommended that patient remain compliant with established discharge plan and continued treatment.   [place]. MSW, [facility]  09/16/2015

## 2015-09-16 NOTE — Progress Notes (Signed)
Pt has been pleasant and cooperative. Pt denies SI and A/V hallucinations. Pt is able to contract for safety. Pt did attend some unit activities.

## 2015-09-17 ENCOUNTER — Telehealth: Payer: Self-pay | Admitting: *Deleted

## 2015-09-17 DIAGNOSIS — F331 Major depressive disorder, recurrent, moderate: Secondary | ICD-10-CM

## 2015-09-17 MED ORDER — LORAZEPAM 0.5 MG PO TABS: 0.5000 mg | ORAL_TABLET | Freq: Three times a day (TID) | ORAL | Status: DC

## 2015-09-17 MED ORDER — FLUDROCORTISONE ACETATE 0.1 MG PO TABS: 0.2000 mg | ORAL_TABLET | Freq: Every day | ORAL | Status: DC

## 2015-09-17 MED ORDER — FLUOXETINE HCL 20 MG PO CAPS: 20.0000 mg | ORAL_CAPSULE | Freq: Every day | ORAL | Status: DC

## 2015-09-17 MED ORDER — FENTANYL 50 MCG/HR TD PT72: 50.0000 ug | MEDICATED_PATCH | TRANSDERMAL | Status: DC

## 2015-09-17 MED ORDER — TRAZODONE HCL 100 MG PO TABS: 100.0000 mg | ORAL_TABLET | Freq: Every evening | ORAL | Status: DC | PRN

## 2015-09-17 NOTE — Telephone Encounter (Signed)
  Oncology Nurse Navigator Documentation  Navigator Location: [facility] On[facility] (09/17/15 1511) Navigator Encounter Type: Telephone (09/17/15 1511)               Barriers/Navigation Needs: Coordination of Care (09/17/15 1511)      Spoke with [facility] Director, Laqueta Carina.  She stated Mr Langeland has been discharged from the facility.  [name], RN, BSN, [facility] [phone]                       Time Spent with Patient: 15 (09/17/15 1511)

## 2015-09-17 NOTE — Tx Team (Signed)
Interdisciplinary Treatment Plan Update (Adult)         Date: 09/17/2015   Time Reviewed: 9:30 AM   Progress in Treatment: Improving Attending groups: No  Participating in groups: No  Taking medication as prescribed: Yes  Tolerating medication: Yes  Family/Significant other contact made: Yes, CSW has spoken with the pt's brother  Patient understands diagnosis: Yes  Discussing patient identified problems/goals with staff: Yes  Medical problems stabilized or resolved: Yes  Denies suicidal/homicidal ideation: Yes  Issues/concerns per patient self-inventory: Yes  Other:   New problem(s) identified: N/A   Discharge Plan or Barriers: Pt plans to live with his brother in [facility] and follow up for chemotherapy with the [facility] [place] with [facility] for substance abuse counseling, education, therapy and medication management.   Reason for Continuation of Hospitalization:   Depression   Anxiety   Medication Stabilization   Comments: N/A   Estimated date of discharge: 09/17/15      Patient is a 40 year old man transferred from [place] now lives in [place].  Pt had been there for several days for alcohol withdrawal and medical stabilization. Patient presented there over a week ago after leaving his nursing home and drinking for several days. Pt had previously been a resident of a nursing home while receiving treatment for throat cancer which the pt reports had been successful.  Patient had continued to report depression during his time at [facility]. Patient continues to describe himself as depressed. His sleep is poor. He feels very nervous and anxious all the time. He is worried a great deal about his health problems. He reports the acute stress was learning that he had new disease possibly metastases in his lungs and was going to need more cancer treatment. This got him very upset. He continues to  worry about that. Patient has now been several days off of alcohol. He still feels nervous and jittery and can get some visual hallucinations at night but none during the day. Although he describes himself as depressed he denies any suicidal wish or intent or plan. Does not feel hopeless. He is able to articulate positive things to live for. Patient has multiple physical problems including the cancer, the residual feeding tube in his stomach although he no longer apparently needs to use it, the weight loss he continues to have since his cancer treatment, recurrent hypokalemia which was still in a problem as of a few days ago. Recurrent hypotension which is being treated with cortisone. Chronic pain for which she was on a fentanyl patch, chronic anxiety.  Patient had been living at a skilled nursing facility. He left there abruptly and went out drinking. He is unclear whether he will be able to return there but he would very much like to. Seems like he has a good relationship with the rest of his family however.  Patient describes decades of alcohol abuse. He's had treatment in the past but it had been years since he had last been sober until he got cancer treatment. He said he had been doing well in the nursing home until just this past week. He does have a past history of DTs and seizures. Denies any history of abuse of other drugs.Pt has low potassium. Chronic pain. Weight loss, low blood pressure.  Patient will benefit from crisis stabilization, medication evaluation, group therapy, and psycho education in addition to case management for discharge planning. Patient  and CSW reviewed pt's identified goals and treatment plan. Pt verbalized understanding and agreed to treatment plan.      Review of initial/current patient goals per problem list:  1. Goal(s): Patient will participate in aftercare plan   Met: Yes  Target date: 3-5 days post admission date   As evidenced by: Patient will participate within  aftercare plan AEB aftercare provider and housing plan at discharge being identified.   1/30: Pt plans to live with his brother in [facility] and follow up for chemotherapy with the [facility] [place] with [facility] for substance abuse counseling, education, therapy and medication management.    2. Goal (s): Patient will exhibit decreased depressive symptoms and suicidal ideations.   Met: Adequate for discharge per MD.  Target date: 3-5 days post admission date   As evidenced by: Patient will utilize self-rating of depression at 3 or below and demonstrate decreased signs of depression or be deemed stable for discharge by MD.   1/30: Goal progressing.  Pt denies SI  1/30: Adequate for discharge per MD.  Pt denies SI.   Pt reports he is safe for discharge    3. Goal(s): Patient will demonstrate decreased signs and symptoms of anxiety.   Met: Adequate for discharge per MD.  Target date: 3-5 days post admission date   As evidenced by: Patient will utilize self-rating of anxiety at 3 or below and demonstrated decreased signs of anxiety, or be deemed stable for discharge by MD   1/30: Goal progressing  1/30: Adequate for discharge per MD. Pt reports baseline symptoms of anxiety    4. Goal(s): Patient will demonstrate decreased signs of withdrawal due to substance abuse   Met: Yes  Target date: 3-5 days post admission date   As evidenced by: Patient will produce a CIWA/COWS score of 0, have stable vitals signs, and no symptoms of withdrawal   1/30:  Patient produced a CIWA/COWS score of 0, has stable vitals signs, and no symptoms of withdrawal      Attendees:  Patient:  Family:  Physician: Jerilee Hoh, MD       09/17/2015 9:30 AM  Nursing: Nicanor Bake, RN       09/17/2015 9:30 AM  Clinical Social Worker: Marylou Flesher, [place]  09/17/2015 9:30 AM  Clinical Social Worker: Marylou Flesher, [place]  09/17/2015 9:30 AM   Nursing: Winona Legato Herbin    09/17/2015 9:30 AM  Other:        09/17/2015 9:30 AM  Other:        09/17/2015 9:30 AM

## 2015-09-17 NOTE — Progress Notes (Signed)
Pt denies SI/HI/AVH. Pt given discharge instructions including f/u appointments. Pt states understanding. Pt states receipt of all belongings.   

## 2015-09-17 NOTE — Discharge Summary (Addendum)
Physician Discharge Summary Note  Patient:  Marvin [name] is an 40 y.o., male MRN:  [id number] DOB:  1976-01-02 Patient phone:  [phone] (home)  Patient address:   [place], [place] [address],  Total Time spent with patient: 45 minutes  Date of Admission:  09/14/2015 Date of Discharge: 09/17/2015  Reason for Admission:  Worsening depression and suicidality  Principal Problem: Major depressive disorder, recurrent episode, moderate ([place]) Discharge Diagnoses: Patient Active Problem List   Diagnosis Date Noted  . Major depressive disorder, recurrent episode, moderate ([place]) [F33.1] 09/17/2015  . Chronic pain [G89.29] 09/15/2015  . Alcohol use disorder, severe, dependence ([place]) [F10.20] 09/13/2015  . Pancytopenia, acquired ([place]) [V25.366] 09/11/2015  . 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 Malignant neoplasm metastatic to lung ([place]) [C78.00]   . Hypotension [I95.9] 09/06/2015  . Hypotension, postural [I95.1] 04/10/2015  . Orthostatic hypotension [I95.1] 03/28/2015  . S/P gastrostomy ([place]) [Z93.1] 03/13/2015  . GERD (gastroesophageal reflux disease) [K21.9] 03/12/2015  . Anxiety disorder [F41.9] 03/12/2015  . Tobacco abuse [Z72.0] 03/12/2015  . 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 Protein-calorie malnutrition, severe ([place]) [E43] 02/27/2015  . Malnutrition ([place]) [E46]   . 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 He had been there for several days for alcohol withdrawal and medical stabilization. Patient presented there over a week ago after leaving his nursing home and drinking for several days. Patient had continued to report depression during his time at [facility]. Patient continues to describe himself as depressed. His sleep is poor. He feels very nervous and anxious all the time. He is worried a great deal about his health problems. He reports  the acute stress was learning that he had new disease possibly metastases in his lungs and was going to need more cancer treatment. This got him very upset. He continues to worry about that. Patient has now been several days off of alcohol. He still feels nervous and jittery and can get some visual hallucinations at night but none during the day. Although he describes himself as depressed he denies any suicidal wish or intent or plan. Does not feel hopeless. He is able to articulate positive things to live for. Patient has multiple physical problems including the cancer, the residual feeding tube in his stomach although he no longer apparently needs to use it, the weight loss he continues to have since his cancer treatment, recurrent hypokalemia which was still in a problem as of a few days ago. Recurrent hypotension which is being treated with cortisone. Chronic pain for which she was on a fentanyl patch, chronic anxiety.   Substance abuse history: Patient describes decades of alcohol abuse. He's had treatment in the past but it had been years since he had last been sober until he got cancer treatment. He said he had been doing well in the nursing home until just this past week. He does have a past history of DTs and seizures. Denies any history of abuse of other drugs.   Past Psychiatric History: Patient claims that he had never had any psychiatric treatment other than for alcohol abuse in the past. No past history of suicide attempts. He had been unaware that he had been treated for depression although review of the notes looks like he was on citalopram at his nursing home. According to the last psychiatric note he was supposed to be on Prozac at [facility] but  somehow it seems to of gotten lost in transition. No history of suicide attempts no history of mania and no history of psychosis.    Past Medical History:See above multiple problems. Throat cancer, now possible return of disease in the lungs.  Low potassium. Chronic pain. Weight loss, low blood pressure Past Medical History  Diagnosis Date  . ETOH abuse   . Alcohol dependence ([place]) 04/17/2012  . COPD (chronic obstructive pulmonary disease) ([place])   . Shortness of breath dyspnea     occ  . Anxiety   . GERD (gastroesophageal reflux disease)   . Seizures ([place])     one episode 09/2012, suspected related to ETOH withdrawal  . Anxiety disorder 03/12/2015  . Headache 05/10/2015  . Hypokalemia 05/10/2015  . 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 Cancer ([place])   . Throat cancer ([place])   . Severe major depression, single episode, without psychotic features ([place]) 09/13/2015    Past Surgical History  Procedure Laterality Date  . Knee surgery Left     kneecap -screws  . Appendectomy    . Tracheostomy tube placement N/A 02/22/2015    Procedure: TRACHEOSTOMY;  Surgeon: [name], MD;  Location: [facility];  Service: ENT;  Laterality: N/A;  awake tracheostomy  . Direct laryngoscopy N/A 02/22/2015    Procedure: DIRECT LARYNGOSCOPY;  Surgeon: [name], MD;  Location: [facility];  Service: ENT;  Laterality: N/A;  direct laryngoscopy with biopsy  . Multiple extractions with alveoloplasty N/A 03/05/2015    Procedure: Extraction of tooth #'s 1,2,8,9,15,16,17,18,31,32 with alveoloplasty, mandibular right lateral exostoses, and gross debridement of remaining teeth;  Surgeon: Lenn Cal, DDS;  Location: [facility];  Service: Oral Surgery;  Laterality: N/A;   Family History:  Family History  Problem Relation Age of Onset  . Cancer Mother   . Heart attack Maternal Grandmother     age of death 31  . Heart attack Maternal Grandfather   . Cancer Paternal Grandfather    Social History: Patient had been living at a skilled nursing facility. He left there abruptly and went out drinking. He is unclear whether he will be able to return there but he would very much like to. Seems like he has a good relationship with the rest of his family however. History   Alcohol Use  . 0.0 oz/week  . 0 Standard drinks or equivalent per week    Comment: 6 -40-oz. beers daily     History  Drug Use No    Social History   Social History  . Marital Status: Single    Spouse Name: N/A  . Number of Children: N/A  . Years of Education: N/A   Social History Main Topics  . Smoking status: Current Every Day Smoker -- 1.00 packs/day for 28 years    Types: Cigarettes    Last Attempt to Quit: 01/17/2015  . Smokeless tobacco: Never Used  . Alcohol Use: 0.0 oz/week    0 Standard drinks or equivalent per week     Comment: 6 -40-oz. beers daily  . Drug Use: No  . Sexual Activity: No   Other Topics Concern  . None   Social History Narrative   Epworth sleepiness scale as of 07/16/15 14    Hospital Course:   Today I spoke with the patient and with his sister Rosezetta Schlatter, phone number [phone].  Patient was originally diagnosed with supraglotic cancer back in June 2016. For the last several months he had been living in a nursing home.  He has a prior history of alcoholism. He says that he was sober for about 6 months but relapsed recently after he was told he had metastases on his lungs.  Per the sister the patient relapsed about a month ago. He showed up intoxicated for his last appointment with the oncologist. The oncologist told the patient that he was not going to be a candidate for chemotherapy unless he were to be sober. Otherwise his only option was going to be hospice. After this news patient went on an alcohol binge for about 4 days. The family decided to petition in order to stop him from using alcohol.  The patient was admitted to [facility] where he was stabilized medically. They felt the patient had some increased risk for suicide and therefore was transferred to our behavioral health unit. Here it at all times the patient denied suicidality, homicidality, auditory or visual hallucinations.  Patient requested to be discharged in order to restart  his treatment with oncology as soon as possible. His brother and sister are supportive of him.  The sister does not have any concerns about the patient's safety if he were to be discharged from the hospital today.  Patient will not be returning to their nursing home as he has been discharged from this facility. He will be moving in with his brother.  On the day of the discharge accident. The patient pulled his PEG tube.  I discussed the case with surgery who stated that if the patient has not been using the PEG. There is no need to the insert the tube. (Pt has not been needing the PEG for the last 4 months).  On the day of the discharge the patient denied a SI, HI or having auditory or visual hallucinations. He denied problems with mood, appetite, energy, sleep or concentration. He denied side effects from medications. He denied physical complaints.  There was no need for seclusion, restraints or forced medications during his stay. There were no unsafe behaviors. There were no disruptive behaviors.  Physical Findings: AIMS: Facial and Oral Movements Muscles of Facial Expression: None, normal Lips and Perioral Area: None, normal Jaw: None, normal Tongue: None, normal,Extremity Movements Upper (arms, wrists, hands, fingers): None, normal Lower (legs, knees, ankles, toes): None, normal,  , Overall Severity Severity of abnormal movements (highest score from questions above): None, normal Incapacitation due to abnormal movements: None, normal Patient's awareness of abnormal movements (rate only patient's report): No Awareness, Dental Status Current problems with teeth and/or dentures?: No Does patient usually wear dentures?: No  CIWA:  CIWA-Ar Total: 5 COWS:     Musculoskeletal: Strength & Muscle Tone: within normal limits Gait & Station: normal Patient leans: N/A  Psychiatric Specialty Exam: Review of Systems  Constitutional: Negative.   HENT: Negative.   Eyes: Negative.    Respiratory: Negative.   Cardiovascular: Negative.   Gastrointestinal: Negative.   Genitourinary: Negative.   Musculoskeletal: Negative.   Skin: Negative.   Endo/Heme/Allergies: Negative.   Psychiatric/Behavioral: Positive for depression and substance abuse. Negative for suicidal ideas, hallucinations and memory loss. The patient is nervous/anxious. The patient does not have insomnia.     Blood pressure 118/94, pulse 123, temperature 98.2 F (36.8 C), temperature source Oral, resp. rate 18, height '6\' 1"'  (1.854 m), weight 59.421 kg (131 lb), SpO2 100 %.Body mass index is 17.29 kg/(m^2).  General Appearance: Well Groomed  Eye Contact::  Good  Speech:  Clear and Coherent  Volume:  Normal  Mood:  Anxious  Affect:  Appropriate and Congruent  Thought Process:  Linear  Orientation:  Full (Time, Place, and Person)  Thought Content:  Hallucinations: None  Suicidal Thoughts:  No  Homicidal Thoughts:  No  Memory:  Immediate;   Good Recent;   Good Remote;   Good  Judgement:  Fair  Insight:  Good  Psychomotor Activity:  Normal  Concentration:  Good  Recall:  Good  Fund of Knowledge:Good  Language: Good  Akathisia:  No  Handed:    AIMS (if indicated):     Assets:  Communication Skills Social Support  ADL's:  Intact  Cognition: WNL  Sleep:  Number of Hours: 8.15   Have you used any form of tobacco in the last 30 days? (Cigarettes, Smokeless Tobacco, Cigars, and/or Pipes): Yes  Has this patient used any form of tobacco in the last 30 days? (Cigarettes, Smokeless Tobacco, Cigars, and/or Pipes) Yes, Yes, A prescription for an FDA-approved tobacco cessation medication was offered at discharge and the patient refused  Metabolic Disorder Labs:  No results found for: HGBA1C, MPG No results found for: PROLACTIN Lab Results  Component Value Date   CHOL 105 07/19/2015   TRIG 75 07/19/2015   HDL 34* 07/19/2015   LDLCALC 56 07/19/2015    Results for AMONI, MORALES (MRN  [id number]) as of 09/17/2015 11:44  Ref. Range 09/12/2015 16:34 09/13/2015 08:44 09/15/2015 14:30  Sodium Latest Ref Range: 135-145 mmol/L 143  138  Potassium Latest Ref Range: 3.5-5.1 mmol/L 2.9 (L)  3.3 (L)  Chloride Latest Ref Range: 101-111 mmol/L 103  96 (L)  CO2 Latest Ref Range: 22-32 mmol/L 30  32  BUN Latest Ref Range: 6-20 mg/dL <5 (L)  13  Creatinine Latest Ref Range: 0.61-1.24 mg/dL 0.56 (L)  0.67  Calcium Latest Ref Range: 8.9-10.3 mg/dL 8.2 (L)  9.2  EGFR (Non-African Amer.) Latest Ref Range: >60 mL/min >60  >60  EGFR (African American) Latest Ref Range: >60 mL/min >60  >60  Glucose Latest Ref Range: 65-99 mg/dL 102 (H)  109 (H)  Anion gap Latest Ref Range: 5-'15  10  10  ' Alkaline Phosphatase Latest Ref Range: 38-126 U/L 133 (H)    Albumin Latest Ref Range: 3.5-5.0 g/dL 3.7    AST Latest Ref Range: 15-41 U/L 31    ALT Latest Ref Range: 17-63 U/L 13 (L)    Total Protein Latest Ref Range: 6.5-8.1 g/dL 6.8    Total Bilirubin Latest Ref Range: 0.3-1.2 mg/dL 0.4    WBC Latest Ref Range: 4.0-10.5 K/uL 3.9 (L)    RBC Latest Ref Range: 4.22-5.81 MIL/uL 4.13 (L)    Hemoglobin Latest Ref Range: 13.0-17.0 g/dL 13.6    HCT Latest Ref Range: 39.0-52.0 % 40.7    MCV Latest Ref Range: 78.0-100.0 fL 98.5    MCH Latest Ref Range: 26.0-34.0 pg 32.9    MCHC Latest Ref Range: 30.0-36.0 g/dL 33.4    RDW Latest Ref Range: 11.5-15.5 % 17.3 (H)    Platelets Latest Ref Range: 347-425 K/uL 956    Salicylate Lvl Latest Ref Range: 2.8-30.0 mg/dL <4.0    Acetaminophen Latest Ref Range: 10-30 ug/mL <10 (L)    Alcohol, Ethyl (B) Latest Ref Range: <5 mg/dL 364 (HH)    Amphetamines Latest Ref Range: NONE DETECTED   NONE DETECTED   Barbiturates Latest Ref Range: NONE DETECTED   NONE DETECTED   Benzodiazepines Latest Ref Range: NONE DETECTED   POSITIVE (A)   Opiates Latest Ref Range: NONE DETECTED   POSITIVE (  A)   COCAINE Latest Ref Range: NONE DETECTED   NONE DETECTED   Tetrahydrocannabinol Latest Ref  Range: NONE DETECTED   NONE DETECTED        Medication List    STOP taking these medications        thiamine 100 MG tablet      TAKE these medications      Indication   fentaNYL 50 MCG/HR  Commonly known as:  DURAGESIC - dosed mcg/hr  Place 1 patch (50 mcg total) onto the skin every 3 (three) days.  Notes to Patient:  Chronic pain      fludrocortisone 0.1 MG tablet  Commonly known as:  FLORINEF  Take 2 tablets (0.2 mg total) by mouth daily.  Notes to Patient:  hypotension      FLUoxetine 20 MG capsule  Commonly known as:  PROZAC  Take 1 capsule (20 mg total) by mouth daily.  Notes to Patient:  depression      LORazepam 0.5 MG tablet  Commonly known as:  ATIVAN  Take 1 tablet (0.5 mg total) by mouth 3 (three) times daily.  Notes to Patient:  anxiety      traZODone 100 MG tablet  Commonly known as:  DESYREL  Take 1 tablet (100 mg total) by mouth at bedtime as needed for sleep.  Notes to Patient:  insomnia         Follow-up Information    Follow up with [place] [facility].   Why:  Please call the office of Dr. Lucas Mallow at [phone] to schedule your follow up appointment for scheduling for chemotherapy.   Contact information:    [address] [place], [place] [address] Phone: [phone] Scheduling: [phone] Fax:        Hours: Open today  8AM-5PM                       Follow up with [facility].   Why:  Please arrive to the walk-in clinic between the hours of 8AM-5PM (arrive between 12 noon-3pm for fast service) for your assessment for medication managment and therapy   Contact information:   [address], [place], [place] [address] [phone] Fax: [phone]                            See Psychiatric Specialty Exam and Suicide Risk Assessment completed by Attending Physician prior to discharge.  Discharge destination:  Home  Is patient on  multiple antipsychotic therapies at discharge:  No   Has Patient had three or more failed trials of antipsychotic monotherapy by history:  No  Recommended Plan for Multiple Antipsychotic Therapies: NA  >30 minutes.  >50 % of the time was used in coordination of care.  Signed: Hildred Priest 09/17/2015, 1:11 PM

## 2015-09-17 NOTE — Progress Notes (Signed)
Patient informed writer that his feeding tube had accidentally been pulled out this morning.  Writer with with Dr Bary Leriche and informed her of what happened.  Dr is on her way to assess patient.

## 2015-09-17 NOTE — Tx Team (Signed)
Interdisciplinary Treatment Plan Update (Adult)         Date: 09/17/2015   Time Reviewed: 9:30 AM   Progress in Treatment: Improving Attending groups: No  Participating in groups: No  Taking medication as prescribed: Yes  Tolerating medication: Yes  Family/Significant other contact made: Yes, CSW has spoken with the pt's brother  Patient understands diagnosis: Yes  Discussing patient identified problems/goals with staff: Yes  Medical problems stabilized or resolved: Yes  Denies suicidal/homicidal ideation: Yes  Issues/concerns per patient self-inventory: Yes  Other:   New problem(s) identified: N/A   Discharge Plan or Barriers: Pt plans to live with his brother in [facility] and follow up for chemotherapy with the [facility] [place] with [facility] for substance abuse counseling, education, therapy and medication management.   Reason for Continuation of Hospitalization:   Depression   Anxiety   Medication Stabilization   Comments: N/A   Estimated date of discharge: 09/17/15      Patient is a 40 year old man transferred from [place] now lives in [place].  Pt had been there for several days for alcohol withdrawal and medical stabilization. Patient presented there over a week ago after leaving his nursing home and drinking for several days. Pt had previously been a resident of a nursing home while receiving treatment for throat cancer which the pt reports had been successful.  Patient had continued to report depression during his time at [facility]. Patient continues to describe himself as depressed. His sleep is poor. He feels very nervous and anxious all the time. He is worried a great deal about his health problems. He reports the acute stress was learning that he had new disease possibly metastases in his lungs and was going to need more cancer treatment. This got him very upset. He continues to  worry about that. Patient has now been several days off of alcohol. He still feels nervous and jittery and can get some visual hallucinations at night but none during the day. Although he describes himself as depressed he denies any suicidal wish or intent or plan. Does not feel hopeless. He is able to articulate positive things to live for. Patient has multiple physical problems including the cancer, the residual feeding tube in his stomach although he no longer apparently needs to use it, the weight loss he continues to have since his cancer treatment, recurrent hypokalemia which was still in a problem as of a few days ago. Recurrent hypotension which is being treated with cortisone. Chronic pain for which she was on a fentanyl patch, chronic anxiety.  Patient had been living at a skilled nursing facility. He left there abruptly and went out drinking. He is unclear whether he will be able to return there but he would very much like to. Seems like he has a good relationship with the rest of his family however.  Patient describes decades of alcohol abuse. He's had treatment in the past but it had been years since he had last been sober until he got cancer treatment. He said he had been doing well in the nursing home until just this past week. He does have a past history of DTs and seizures. Denies any history of abuse of other drugs.Pt has low potassium. Chronic pain. Weight loss, low blood pressure.  Patient will benefit from crisis stabilization, medication evaluation, group therapy, and psycho education in addition to case management for discharge planning. Patient  and CSW reviewed pt's identified goals and treatment plan. Pt verbalized understanding and agreed to treatment plan.      Review of initial/current patient goals per problem list:  1. Goal(s): Patient will participate in aftercare plan   Met: Yes  Target date: 3-5 days post admission date   As evidenced by: Patient will participate within  aftercare plan AEB aftercare provider and housing plan at discharge being identified.   1/30: Pt plans to live with his brother in [facility] and follow up for chemotherapy with the [facility] [place] with [facility] for substance abuse counseling, education, therapy and medication management.    2. Goal (s): Patient will exhibit decreased depressive symptoms and suicidal ideations.   Met: No  Target date: 3-5 days post admission date   As evidenced by: Patient will utilize self-rating of depression at 3 or below and demonstrate decreased signs of depression or be deemed stable for discharge by MD.   1/30: Goal progressing.  Pt denies SI    3. Goal(s): Patient will demonstrate decreased signs and symptoms of anxiety.   Met: No  Target date: 3-5 days post admission date   As evidenced by: Patient will utilize self-rating of anxiety at 3 or below and demonstrated decreased signs of anxiety, or be deemed stable for discharge by MD   1/30: Goal progressing    4. Goal(s): Patient will demonstrate decreased signs of withdrawal due to substance abuse   Met: Yes  Target date: 3-5 days post admission date   As evidenced by: Patient will produce a CIWA/COWS score of 0, have stable vitals signs, and no symptoms of withdrawal   1/30:  Patient produced a CIWA/COWS score of 0, has stable vitals signs, and no symptoms of withdrawal      Attendees:  Patient:  Family:  Physician: Jerilee Hoh, MD       09/17/2015 9:30 AM  Nursing: Nicanor Bake, RN       09/17/2015 9:30 AM  Clinical Social Worker: Marylou Flesher, [place]  09/17/2015 9:30 AM  Clinical Social Worker: Marylou Flesher, [place]  09/17/2015 9:30 AM  Nursing: Winona Legato [place]    09/17/2015 9:30 AM  Other:        09/17/2015 9:30 AM  Other:        09/17/2015 9:30 AM

## 2015-09-17 NOTE — BHH Suicide Risk Assessment (Addendum)
[facility] Discharge Suicide Risk Assessment   Principal Problem: Major depressive disorder, recurrent episode, moderate ([place]) Discharge Diagnoses:  Patient Active Problem List   Diagnosis Date Noted  . Major depressive disorder, recurrent episode, moderate ([place]) [F33.1] 09/17/2015  . Chronic pain [G89.29] 09/15/2015  . Alcohol use disorder, severe, dependence ([place]) [F10.20] 09/13/2015  . Pancytopenia, acquired ([place]) [id number] 09/11/2015  . 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 Malignant neoplasm metastatic to lung ([place]) [C78.00]   . Hypotension [I95.9] 09/06/2015  . Hypotension, postural [I95.1] 04/10/2015  . Orthostatic hypotension [I95.1] 03/28/2015  . S/P gastrostomy ([place]) [Z93.1] 03/13/2015  . GERD (gastroesophageal reflux disease) [K21.9] 03/12/2015  . Anxiety disorder [F41.9] 03/12/2015  . Tobacco abuse [Z72.0] 03/12/2015  . 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 Protein-calorie malnutrition, severe ([place]) [E43] 02/27/2015  . Malnutrition ([place]) [E46]   . 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] 02/22/2015    Total Time spent with patient: 45 minutes  Psychiatric Specialty Exam: ROS                                                         Mental Status Per Nursing Assessment::   On Admission:     Demographic Factors:  Male and Caucasian  Loss Factors: Decline in physical health  Historical Factors: Impulsivity  Risk Reduction Factors:   Sense of responsibility to family, Living with another person, especially a relative and Positive social support  Continued Clinical Symptoms:  Alcohol/Substance Abuse/Dependencies  Cognitive Features That Contribute To Risk:  None    Suicide Risk:  Minimal: No identifiable suicidal ideation.  Patients presenting with no risk factors but with morbid ruminations; may be classified as minimal risk based on the severity of the depressive symptoms      Hildred Priest, MD 09/17/2015,  11:35 AM

## 2015-09-17 NOTE — [facility] Suicide Risk Assessment (Signed)
[facility] INPATIENT:  Family/Significant Other Suicide Prevention Education  Suicide Prevention Education:  Patient Refusal for Family/Significant Other Suicide Prevention Education: The patient Marvin [name] has refused to provide written consent for family/significant other to be provided Family/Significant Other Suicide Prevention Education during admission and/or prior to discharge.  Physician notified. Completed with pt.  Marvin [name] 09/17/2015, 12:32 PM

## 2015-09-17 NOTE — Progress Notes (Addendum)
  [facility] Adult Case Management Discharge Plan :  Will you be returning to the same living situation after discharge:  No, pt will be living with his brother in [place] At discharge, do you have transportation home?: Yes,  pt will be picked up by his sister Do you have the ability to pay for your medications: Yes,  pt will be provided with prescriptions at discharge  Release of information consent forms completed and in the chart;  Patient's signature needed at discharge.  Patient to Follow up at: Follow-up Information    Follow up with [place] at [facility].   Why:  Please call the office of Dr. Lucas Mallow at [phone] to schedule your follow up appointment for scheduling for chemotherapy.   Contact information:   [address] [place], [place] [address] Phone: [phone] Scheduling: [phone] Fax: [phone]                        Follow up with [facility].   Why:  Please arrive to the walk-in clinic between the hours of 8AM-5PM (arrive between 12 noon-3pm for fast service) for your assessment for medication management and therapy   Contact information:   [address], [place], [place] [address] [phone] Fax: [phone]                             Next level of care provider has access to [place]: Yes, the [place] at [facility] does, [facility] does not  Safety Planning and Suicide Prevention discussed: Yes,  completed with pt  Have you used any form of tobacco in the last 30 days? (Cigarettes, Smokeless Tobacco, Cigars, and/or Pipes): Yes  Has patient been referred to the Quitline?: Yes, faxed on 09/17/15  Patient has been referred for addiction treatment: Yes  Alphonse Guild Rose-Marie Hickling 09/17/2015, 1:08 PM

## 2015-09-17 NOTE — Progress Notes (Signed)
Recreation Therapy Notes  Date: 01.30.17 Time: 3:00 pm Location: Craft Room  Group Topic: Self-expression  Goal Area(s) Addresses:  Patient will identify one color per emotion listed on wheel. Patient will verbalize benefit of using art as a means of self-expression. Patient will verbalize one emotion experienced during group. Patient will be educated on other forms of self-expression.  Behavioral Response: Attentive, Interactive  Intervention: Emotion Wheel  Activity: Patients were given an Licensed conveyancer with 7 different emotions and were instructed to pick a color for each emotion.  Education: LRT educated patients on other forms of self-expression.  Education Outcome: In group clarification offered  Clinical Observations/Feedback: Patient completed activity by picking a color for each emotion. Patient contributed to group discussion by stating what colors he picked for each emotion.  Leonette Monarch, LRT/CTRS 09/17/2015 3:58 PM

## 2015-09-20 ENCOUNTER — Other Ambulatory Visit: Payer: Self-pay | Admitting: *Deleted

## 2015-09-21 ENCOUNTER — Telehealth: Payer: Self-pay | Admitting: *Deleted

## 2015-09-21 ENCOUNTER — Encounter: Payer: Self-pay | Admitting: Adult Health

## 2015-09-21 ENCOUNTER — Encounter: Payer: Self-pay | Admitting: *Deleted

## 2015-09-21 ENCOUNTER — Telehealth: Payer: Self-pay | Admitting: Hematology and Oncology

## 2015-09-21 ENCOUNTER — Encounter: Payer: Self-pay | Admitting: Hematology and Oncology

## 2015-09-21 ENCOUNTER — Ambulatory Visit (HOSPITAL_BASED_OUTPATIENT_CLINIC_OR_DEPARTMENT_OTHER): Payer: Medicaid Other | Admitting: Hematology and Oncology

## 2015-09-21 VITALS — BP 125/83 | HR 85 | Temp 98.1°F | Resp 18 | Wt 139.3 lb

## 2015-09-21 DIAGNOSIS — C321 Malignant neoplasm of supraglottis: Secondary | ICD-10-CM | POA: Diagnosis not present

## 2015-09-21 DIAGNOSIS — C7800 Secondary malignant neoplasm of unspecified lung: Secondary | ICD-10-CM

## 2015-09-21 DIAGNOSIS — F331 Major depressive disorder, recurrent, moderate: Secondary | ICD-10-CM | POA: Diagnosis not present

## 2015-09-21 DIAGNOSIS — Z515 Encounter for palliative care: Secondary | ICD-10-CM

## 2015-09-21 DIAGNOSIS — F1020 Alcohol dependence, uncomplicated: Secondary | ICD-10-CM

## 2015-09-21 NOTE — Progress Notes (Signed)
Marvin [name] OFFICE PROGRESS NOTE  Patient Care Team: No Pcp Per Patient as PCP - General (General Practice) [name], MD as Consulting Physician (Otolaryngology) [name], MD as Attending Physician (Radiation Oncology) [name], MD as Consulting Physician (Hematology and Oncology) Leota Sauers, RN as Oncology Nurse Navigator Lenn Cal, DDS as Consulting Physician (Dentistry)  SUMMARY OF ONCOLOGIC HISTORY: Oncology History   Carcinoma of supraglottis   Staging form: Larynx - Supraglottis, AJCC 7th Edition     Clinical stage from 03/13/2015: Stage IVA (T3, N2b, M0) - Signed by [name], MD on 03/13/2015     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[place])   01/22/2015 Imaging CT scan showed enhancing infiltrative mass centered at the right piriform sinus with bilateral neck lymphadenopathy   02/22/2015 - 03/08/2015 Hospital Admission He was admitted to the hospital for management of advanced supraglottic cancer status post tracheostomy, biopsy, dental extraction, placement of feeding tube and subsequent discharge to skilled nursing facility   02/22/2015 Pathology Results Accession: [id number] 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   03/09/2015 PET scan 1)  Right piriform sinus mass with right level 2 nodal metastasis.  2)  Hypermetabolic thoracic nodes which are at least partially felt to be reactive.  3)  No evidence of subdiaphragmatic metastatic disease.    03/16/2015 Procedure He has placement of port   03/21/2015 - 05/04/2015 Chemotherapy He received high dose cisplatin x 3 cycles   03/21/2015 - 05/09/2015 Radiation Therapy Tomotherapy to supraglottic tumor and bilateral neck:  70 Gy in 35 fractions to gross disease, 63 Gy in 35 fractions to high risk nodal echelons, and 56 Gy in 35 fractions to intermediate risk nodal echelons.   05/09/2015 Miscellaneous He went  to the ED with fall   06/20/2015 Procedure Tracheostomy was removed   09/06/2015 - 09/08/2015 Hospital Admission  he was admitted to the hospital with alcohol intoxication, dehydration and pain. CT scan of the chest show bilateral metastatic disease to the lung    INTERVAL HISTORY: Please see below for problem oriented charting. The patient returns with support from multiple family members. He was released from behavioral health hospital after severe depression and suicidal ideation. He felt that his mood is under control and he is no longus was [name]. He has quit drinking and smoking. Today, his mother Marvin [name]), [name]), brother Marvin [name]), sister Marvin [name]), and aunt Thayer Headings) were present  REVIEW OF SYSTEMS:   Constitutional: Denies fevers, chills or abnormal weight loss Eyes: Denies blurriness of vision Ears, nose, mouth, throat, and face: Denies mucositis or sore throat Respiratory: Denies cough, dyspnea or wheezes Cardiovascular: Denies palpitation, chest discomfort or lower extremity swelling Gastrointestinal:  Denies nausea, heartburn or change in bowel habits Skin: Denies abnormal skin rashes Lymphatics: Denies new lymphadenopathy or easy bruising Neurological:Denies numbness, tingling or new weaknesses Behavioral/Psych: Mood is stable, no new changes  All other systems were reviewed with the patient and are negative.  I have reviewed the past medical history, past surgical history, social history and family history with the patient and they are unchanged from previous note.  ALLERGIES:  has No Known Allergies.  MEDICATIONS:  Current Outpatient Prescriptions  Medication Sig Dispense Refill  . fentaNYL (DURAGESIC - DOSED MCG/HR) 50 MCG/HR Place 1 patch (50 mcg total) onto the skin every 3 (three) days. 5 patch 0  . fludrocortisone (FLORINEF) 0.1 MG tablet Take 2 tablets (0.2  mg total) by mouth daily. 30 tablet 0  . FLUoxetine (PROZAC) 20 MG capsule Take 1 capsule (20 mg total) by  mouth daily. 30 capsule 0  . LORazepam (ATIVAN) 0.5 MG tablet Take 1 tablet (0.5 mg total) by mouth 3 (three) times daily. 90 tablet 0  . traZODone (DESYREL) 100 MG tablet Take 1 tablet (100 mg total) by mouth at bedtime as needed for sleep. 30 tablet 0   No current facility-administered medications for this visit.   Facility-Administered Medications Ordered in Other Visits  Medication Dose Route Frequency Provider Last Rate Last Dose  . 0.9 %  sodium chloride infusion   Intravenous Once [name], MD        PHYSICAL EXAMINATION: ECOG PERFORMANCE STATUS: 1 - Symptomatic but completely ambulatory  Filed Vitals:   09/21/15 0911  BP: 125/83  Pulse: 85  Temp: 98.1 F (36.7 C)  Resp: 18   Filed Weights   09/21/15 0911  Weight: 139 lb 4.8 oz (63.186 kg)    GENERAL:alert, no distress and comfortable SKIN: skin color, texture, turgor are normal, no rashes or significant lesions EYES: normal, Conjunctiva are pink and non-injected, sclera clear Musculoskeletal:no cyanosis of digits and no clubbing  NEURO: alert & oriented x 3 with fluent speech, no focal motor/sensory deficits  LABORATORY DATA:  I have reviewed the data as listed    Component Value Date/Time   NA 138 09/15/2015 1430   NA 142 08/30/2015 0836   NA 140 07/19/2015   K 3.3* 09/15/2015 1430   K 4.0 08/30/2015 0836   CL 96* 09/15/2015 1430   CO2 32 09/15/2015 1430   CO2 30* 08/30/2015 0836   GLUCOSE 109* 09/15/2015 1430   GLUCOSE 87 08/30/2015 0836   BUN 13 09/15/2015 1430   BUN 5.1* 08/30/2015 0836   BUN 10 07/19/2015   CREATININE 0.67 09/15/2015 1430   CREATININE 0.7 08/30/2015 0836   CREATININE 0.65 08/16/2015 1538   CREATININE 0.6 07/19/2015   CALCIUM 9.2 09/15/2015 1430   CALCIUM 9.0 08/30/2015 0836   PROT 6.8 09/12/2015 1634   PROT 6.5 08/30/2015 0836   ALBUMIN 3.7 09/12/2015 1634   ALBUMIN 3.4* 08/30/2015 0836   AST 31 09/12/2015 1634   AST 17 08/30/2015 0836   ALT 13* 09/12/2015 1634   ALT <9  08/30/2015 0836   ALKPHOS 133* 09/12/2015 1634   ALKPHOS 107 08/30/2015 0836   BILITOT 0.4 09/12/2015 1634   BILITOT 0.56 08/30/2015 0836   GFRNONAA >60 09/15/2015 1430   GFRAA >60 09/15/2015 1430    No results found for: SPEP, UPEP  Lab Results  Component Value Date   WBC 3.9* 09/12/2015   NEUTROABS 1.6* 09/05/2015   HGB 13.6 09/12/2015   HCT 40.7 09/12/2015   MCV 98.5 09/12/2015   PLT 155 09/12/2015      Chemistry      Component Value Date/Time   NA 138 09/15/2015 1430   NA 142 08/30/2015 0836   NA 140 07/19/2015   K 3.3* 09/15/2015 1430   K 4.0 08/30/2015 0836   CL 96* 09/15/2015 1430   CO2 32 09/15/2015 1430   CO2 30* 08/30/2015 0836   BUN 13 09/15/2015 1430   BUN 5.1* 08/30/2015 0836   BUN 10 07/19/2015   CREATININE 0.67 09/15/2015 1430   CREATININE 0.7 08/30/2015 0836   CREATININE 0.65 08/16/2015 1538   CREATININE 0.6 07/19/2015   GLU 82 07/19/2015      Component Value Date/Time   CALCIUM 9.2 09/15/2015  1430   CALCIUM 9.0 08/30/2015 0836   ALKPHOS 133* 09/12/2015 1634   ALKPHOS 107 08/30/2015 0836   AST 31 09/12/2015 1634   AST 17 08/30/2015 0836   ALT 13* 09/12/2015 1634   ALT <9 08/30/2015 0836   BILITOT 0.4 09/12/2015 1634   BILITOT 0.56 08/30/2015 0836       RADIOGRAPHIC STUDIES: I reveal multiple imaging study with the patient and family I have personally reviewed the radiological images as listed and agreed with the findings in the report.    ASSESSMENT & PLAN:  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[place])  I have a long discussion with the patient and family members. We are all committed to support Marvin [name] through this difficult time.  I have enlisted the help of cancer survivorship Navigator to assist in management of this case as well. I will plan to order a PET CT scan next week along with chemotherapy education class and blood work.   My plan would be to proceed with palliative chemotherapy on 10/01/2015 The decision was made based on  publication at the [facility]. It is a category 1 recommendation from NCCN. Vermorken, et al. Alison Stalling J Med [id number]  The chemotherapy consists of   1. Carboplatin (at an area under the curve of 5 mg per milliliter per minute, on day 1) plus  2. Fluorouracil (at a dose of 1000 mg per square meter per day for 4 days) every 3 weeks for a maximum of 6 cycles  3. Cetuximab (at a dose 250 mg per square meter) for a maximum of 6 cycles followed by maintenance treatment afterwards  Patients with stable disease who received chemotherapy plus cetuximab continued to receive cetuximab until disease progression or unacceptable toxic effects, whichever occurred first.  Adding cetuximab to platinum-based chemotherapy with fluorouracil (platinum-fluorouracil) significantly prolonged the median overall survival from 7.4 months in the chemotherapy-alone group to 10.1 months in the group that received chemotherapy plus cetuximab (hazard ratio for death, 0.80; 95% confidence interval, 0.64 to 0.99; P=0.04).   The addition of cetuximab prolonged the median progression-free survival time from 3.3 to 5.6 months (hazard ratio for progression, 0.54; P<0.001) and increased the response rate from 20% to 36% (P<0.001).    Malignant neoplasm metastatic to lung [facility])  So far, the patient is not symptomatic. I will order a PET CT scan for baseline staging as above.  Alcohol use disorder, severe, dependence ([place])  The patient has vowed to stay abstinent. We will get community resources for the patient for support  Major depressive disorder, recurrent episode, moderate ([place])  He felt that his depression is under control and he is not suicidal. He will continue medication as prescribed by recent psychiatrist  Palliative care encounter  The patient has enlisted the help from multiple family members. His medical power of attornies are his brother, Marvin [name] and sister, Marvin [name].  we will try to get advanced directives in  place with the help of our social worker   Orders Placed This Encounter  Procedures  . CBC with Differential    Standing Status: Standing     Number of Occurrences: 20     Standing Expiration Date: 09/21/2016  . Comprehensive metabolic panel    Standing Status: Standing     Number of Occurrences: 22     Standing Expiration Date: 09/20/2016   All questions were answered. The patient knows to call the clinic with any problems, questions or concerns. No barriers to learning was detected. I spent 40  minutes counseling the patient face to face. The total time spent in the appointment was 60 minutes and more than 50% was on counseling and review of test results     [facility], [place], MD 09/21/2015 10:19 AM

## 2015-09-21 NOTE — Assessment & Plan Note (Signed)
I have a long discussion with the patient and family members. We are all committed to support Marvin [name] through this difficult time.  I have enlisted the help of cancer survivorship Navigator to assist in management of this case as well. I will plan to order a PET CT scan next week along with chemotherapy education class and blood work.   My plan would be to proceed with palliative chemotherapy on 10/01/2015 The decision was made based on publication at the [facility]. It is a category 1 recommendation from NCCN. Vermorken, et al. Alison Stalling J Med [phone]  The chemotherapy consists of   1. Carboplatin (at an area under the curve of 5 mg per milliliter per minute, on day 1) plus  2. Fluorouracil (at a dose of 1000 mg per square meter per day for 4 days) every 3 weeks for a maximum of 6 cycles  3. Cetuximab (at a dose 250 mg per square meter) for a maximum of 6 cycles followed by maintenance treatment afterwards  Patients with stable disease who received chemotherapy plus cetuximab continued to receive cetuximab until disease progression or unacceptable toxic effects, whichever occurred first.  Adding cetuximab to platinum-based chemotherapy with fluorouracil (platinum-fluorouracil) significantly prolonged the median overall survival from 7.4 months in the chemotherapy-alone group to 10.1 months in the group that received chemotherapy plus cetuximab (hazard ratio for death, 0.80; 95% confidence interval, 0.64 to 0.99; P=0.04).   The addition of cetuximab prolonged the median progression-free survival time from 3.3 to 5.6 months (hazard ratio for progression, 0.54; P<0.001) and increased the response rate from 20% to 36% (P<0.001).

## 2015-09-21 NOTE — Progress Notes (Signed)
I was present during a family meeting for Marvin [name] today, at the request of Dr. Alvy Bimler.  Marvin [name] was accompanied today by his mother, sister Freida Busman), brother ([name]), and 2 aunts.    Brief history:  The patient was diagnosed  Stage IVA (T3, N2b, M0) carcinoma of the supraglottis in 02/2015, requiring tracheostomy placement for airway management.   He also underwent teeth extraction surgery in preparation for cancer treatment. PET scan showed no evidence of distant metastasis. His post-op course was complicated by aspiration pneumonia requiring 14 day hospital stay.  A G-tube was placed [name] optimize nutrition. He was treated for both alcohol and tobacco withdrawal during this hospitalization, as well.  He was discharged [name] SNF on 03/08/15 for continued care and rehabilitation.  On 03/21/15, he began treatment with concurrent chemoradiation with Cisplatin & IMRT.  His treatment course was complicated by severe dehydration and hypotension requiring daily IV fluids for months and referral [name] cardiology for additional management.  He was started on Florinef for autonomic dysregulation and his BP has since stabilized. On 06/20/15, his tracheostomy was removed.   On 09/07/15, Marvin [name] was found [name] have new, multiple bilateral pulmonary nodules consistent with metastatic disease. Marvin [name] left the SNF where he was living against medical advice.  He began [name] consume alcohol again, which led [name] a recent visit [name] the ED with suicidal ideation and intoxication.  He underwent detoxification and care with a short stay at the [facility].  He returns [name] clinic today [name] see Dr. Alvy Bimler [name] discuss his plan of care in light of new metastatic disease findings.     After lengthy discussion with Dr. Alvy Bimler, the patient and his family have elected [name] start triple-agent chemo/immunotherapy with 5-FU/Carboplatin/Cetuximab.  He will have 3 cycles and then repeat staging scans.  Dr. Alvy Bimler has asked that Marvin [place], RN-H&N Navigator and myself be available for this patient and family given his unique and complex history, social concerns, and for additional support.  Marvin [name] is committed [name] remain sober so he can complete his cancer treatments.  His family reports that they are committed [name] helping provide him with a consistent and safe place [name] live, as well as provide transportation for him for his multiple upcoming appointments.    Plan:  -Chemo class and PET scan next week -Begin chemo on 10/01/15.   -I provided the patient with a copy of patient education materials for each of the 3 drugs in his chemo regimen from [email or web address].  I have given the patient and all of his family members a copy of my business card and encouraged them [name] call me with any questions or concerns. I offered encouragement and helped clarify upcoming treatment plans for the patient and his family.  -Polo Riley, LCSW also saw the patient today and will provide additional social support.    I look forward [name] participating in Marvin [name] care.    Mike Craze, NP [place] [phone]

## 2015-09-21 NOTE — Telephone Encounter (Signed)
Pt confirmed labs/ov per 02/03 POF, gave pt AVS and Calendar... KJ, sent msg to add chemo new chemo treatment.Marland KitchenMarland Kitchen

## 2015-09-21 NOTE — Assessment & Plan Note (Signed)
He felt that his depression is under control and he is not suicidal. He will continue medication as prescribed by recent psychiatrist

## 2015-09-21 NOTE — Progress Notes (Signed)
[facility] Clinical Social Work  Clinical Social Worker met with patient and family after medical oncologist to offer support and assess for psychosocial needs.  The patient was accompanied by his mom, brother, sister, and two aunts.    Mr. Heidemann recently discharged from [facility]- admitted for suicidal ideation and substance abuse.  The patient reported he is feeling motivated to "get on track".  The patient plans to reside at her brother's house and his sister and aunt live close by.  The family reported they are committed to supporting patient emotionally and providing transportation to cancer center.  Mr. Drumgoole has no plans for additional substance abuse counseling in the outpatient setting at this time, but expressed "I think [facility] is going to send something in the mail."  CSW discussed importance of continued intensive substance abuse counseling for recovery process.  CSW will determine outpatient SA counseling options and follow up with patient.  Mr. Turley shared his main concern at this was "how long am I going to live..." Patient and family became tearful and shared their fear surrounding patient's young age and current medical situation.  CSW validated patient's feelings of sense of immortality and provided brief emotional to family..  CSW strongly encouraged patient to utilize [facility] free counseling to provide an outlet to process emotions related to his cancer illness and develop healthy, positive coping skills.  Patient was agreeable to counseling referral.  Patient/family also interested in completing advance directives (HCPOA/Living Will).  Patient or patient's sister plans to call CSW to schedule appointment.    ______________________________________________________________________________________  After today's visit, CSW contacted [facility] LCSW to determine follow up outpatient plans for substance abuse counseling. Appointment was not scheduled for follow up at [facility] It is the patient's responsibility to establish care.  CSW will follow up to provide this information: Follow up with [facility].    Why: Please arrive to the walk-in clinic between the hours of 8AM-5PM (arrive between 12 noon-3pm for fast service) for your assessment for medication management and therapy   Contact information:   [address], [place], [place] [address] [phone] Fax: [phone]         Lauren Mullis, MSW, LCSW, OSW-C Clinical Social Worker [facility] [phone]        

## 2015-09-21 NOTE — Assessment & Plan Note (Signed)
The patient has enlisted the help from multiple family members. His medical power of attornies are his brother, Tad and sister, Freida Busman.  we will try to get advanced directives in place with the help of our social worker

## 2015-09-21 NOTE — Assessment & Plan Note (Signed)
So far, the patient is not symptomatic. I will order a PET CT scan for baseline staging as above.

## 2015-09-21 NOTE — Assessment & Plan Note (Signed)
The patient has vowed to stay abstinent. We will get community resources for the patient for support

## 2015-09-21 NOTE — Telephone Encounter (Signed)
Per staff message and POF I have scheduled appts. Advised scheduler of appts. JMW  

## 2015-09-23 NOTE — Progress Notes (Signed)
  Oncology Nurse Navigator Documentation  Navigator Location: [facility] (09/21/15 0920) Navigator Encounter Type: Clinic/MDC (09/21/15 0920)           Patient Visit Type: MedOnc;Follow-up (09/21/15 0920)   Barriers/Navigation Needs: Family concerns (09/21/15 0920)   Interventions: Other (09/21/15 0920)       Met with Marvin [name] during follow-up with Dr. Alvy Bimler.  He was accompanied by his mother, sister Marvin [name]), brother (Marvin [name]), 2 aunts.  Survivorship NP Marvin [name] was also present.  Marvin Maselli is presently living with bother Marvin [name] in [place].    An aunt lives close by.  They verbalized understanding of Marvin Milos's current diagnosis of non-curable lung metastases, Dr. Calton Dach proposed tmt and the paramount importance of his abstaining from ETOH to maximize tmt benefit and compliance with appts.  They further verbalized understanding of Dr. Calton Dach expressed concern for the need for stable living arrangement, reliable transportation and family support.  Marvin [name] explained that his job keeps him out of town through the week and often on Saturdays.   Family agreed to help Marvin [name] with all aspects of home support and tmt, discussed options for various family members to provide transportation. I acknowledged Marvin [name]' success with his previous treatments, his determination to do what was necessary.  I affirmed his ability to do the same with this next tmt program. I encouraged family to contact me with needs/concerns, provided my contact information to Marvin [name], Marvin [name] and his [name] who will be key family members involved with his care.  Marvin Orem, RN, BSN, [facility] [place] [phone]                     Time Spent with Patient: 75 (09/21/15 0920)

## 2015-09-24 ENCOUNTER — Other Ambulatory Visit: Payer: Self-pay | Admitting: Hematology and Oncology

## 2015-09-24 ENCOUNTER — Other Ambulatory Visit: Payer: Self-pay | Admitting: *Deleted

## 2015-09-24 DIAGNOSIS — C321 Malignant neoplasm of supraglottis: Secondary | ICD-10-CM

## 2015-09-25 ENCOUNTER — Encounter: Payer: Self-pay | Admitting: General Practice

## 2015-09-25 NOTE — Progress Notes (Signed)
Spiritual Care Note  on behalf of [employer] doctoral counseling intern Wendall Papa, MS, [place], LPCA:   Counseling intern Wendall Papa saw Mr Brand for intake visit today, 9-10 am, per referral from [employer], LCSW.  Aviry will enter separate progress note to document this encounter upon IT's correction of her EPIC access.  For questions in the meantime, Aviry can be reached at [phone].  Thank you.   Chaplain Lorrin Jackson, [place], [facility] [place] [facility] Supervisor for [employer] [phone] Voicemail  [phone]

## 2015-09-27 NOTE — Progress Notes (Signed)
Counseling Intake Session  Client is a 40 year old, Caucasian male. Client identifies as Marvin [name], is single, and currently not working. Client denied S/I and H/I, stating that he has "never been suicidal in his life". Counselor used open questions and reflections to discuss client's presenting concerns which included feeling depressed and anxious due to the recent news he has lung cancer on both lungs. Counselor and client processed client's feelings of fear, anxiety, and sadness around this new diagnosis. Client reported that he was recently hospitalized due to binge drinking when he found out about his new diagnosis. Client reported that he just starting feeling happy because he thought he "beat" his throat cancer last year, and then when he got the news about the new cancer diagnosis he become very depressed and anxious again. Counselor and client processed client's binge drinking episode, and discussed his history of substance use that began when he was 40 year old. Client reported he has not drank since he was discharged from the hospital, and that the biggest deterrent from drinking is he cannot receive the chemo for his cancer and his life would be at risk. Counselor explored client's support systems in his life that included his parent's, sister, and brother. Counselor and client discussed client's healthy coping mechanisms which he stated were faith, staying busy, listening to music, and watching movies. Counselor and client processed client's fear around having cancer again, however; the client appears to be hopeful and optimistic about the treatment. Counselor explained to client the importance of seeking out support for his substance use in addition to counseling treatment. Counselor gave client a referral sheet for a drug/alcohol use center to call. Client was compliant and agreed he wanted that additional support. Counselor and client explored client's goals for counseling that included processing  his emotions/feelings around his diagnosis and identifying/managing his depression and anxiety. Client starts his first chemo treatment next week, and the counselor will follow up with him next Friday.   Wendall Papa, MS, [place], [place] Counseling Intern [phone]

## 2015-10-01 ENCOUNTER — Emergency Department (HOSPITAL_COMMUNITY): Payer: Medicaid Other

## 2015-10-01 ENCOUNTER — Ambulatory Visit: Payer: Medicaid Other

## 2015-10-01 ENCOUNTER — Encounter (HOSPITAL_COMMUNITY): Payer: Self-pay | Admitting: Emergency Medicine

## 2015-10-01 ENCOUNTER — Ambulatory Visit (HOSPITAL_BASED_OUTPATIENT_CLINIC_OR_DEPARTMENT_OTHER): Payer: Medicaid Other

## 2015-10-01 ENCOUNTER — Emergency Department (HOSPITAL_COMMUNITY): Payer: Medicaid Other | Admitting: Registered Nurse

## 2015-10-01 ENCOUNTER — Other Ambulatory Visit: Payer: Self-pay

## 2015-10-01 ENCOUNTER — Encounter (HOSPITAL_COMMUNITY)
Admission: RE | Admit: 2015-10-01 | Discharge: 2015-10-01 | Disposition: A | Discharge status: Home / Self Care | Payer: Medicaid Other | Source: Ambulatory Visit | Attending: Radiation Oncology | Admitting: Radiation Oncology

## 2015-10-01 ENCOUNTER — Other Ambulatory Visit (HOSPITAL_BASED_OUTPATIENT_CLINIC_OR_DEPARTMENT_OTHER): Payer: Medicaid Other

## 2015-10-01 ENCOUNTER — Other Ambulatory Visit: Payer: Self-pay | Admitting: Hematology and Oncology

## 2015-10-01 ENCOUNTER — Telehealth: Payer: Self-pay | Admitting: Hematology and Oncology

## 2015-10-01 ENCOUNTER — Encounter: Payer: Self-pay | Admitting: Adult Health

## 2015-10-01 ENCOUNTER — Encounter: Payer: Self-pay | Admitting: *Deleted

## 2015-10-01 ENCOUNTER — Other Ambulatory Visit: Payer: Medicaid Other

## 2015-10-01 ENCOUNTER — Ambulatory Visit (HOSPITAL_BASED_OUTPATIENT_CLINIC_OR_DEPARTMENT_OTHER): Payer: Medicaid Other | Admitting: Hematology and Oncology

## 2015-10-01 ENCOUNTER — Inpatient Hospital Stay (HOSPITAL_COMMUNITY)
Admission: EM | Admit: 2015-10-01 | Discharge: 2015-10-03 | DRG: 915 | Disposition: A | Discharge status: Home / Self Care | Payer: Medicaid Other | Attending: Pulmonary Disease | Admitting: Pulmonary Disease

## 2015-10-01 VITALS — BP 122/90 | HR 72 | Temp 98.2°F | Resp 18 | Ht 73.0 in | Wt 139.6 lb

## 2015-10-01 DIAGNOSIS — C321 Malignant neoplasm of supraglottis: Secondary | ICD-10-CM | POA: Diagnosis present

## 2015-10-01 DIAGNOSIS — J449 Chronic obstructive pulmonary disease, unspecified: Secondary | ICD-10-CM | POA: Diagnosis present

## 2015-10-01 DIAGNOSIS — R739 Hyperglycemia, unspecified: Secondary | ICD-10-CM | POA: Diagnosis present

## 2015-10-01 DIAGNOSIS — Y92239 Unspecified place in hospital as the place of occurrence of the external cause: Secondary | ICD-10-CM | POA: Diagnosis present

## 2015-10-01 DIAGNOSIS — R1310 Dysphagia, unspecified: Secondary | ICD-10-CM | POA: Diagnosis present

## 2015-10-01 DIAGNOSIS — R748 Abnormal levels of other serum enzymes: Secondary | ICD-10-CM | POA: Diagnosis present

## 2015-10-01 DIAGNOSIS — T782XXA Anaphylactic shock, unspecified, initial encounter: Secondary | ICD-10-CM | POA: Diagnosis not present

## 2015-10-01 DIAGNOSIS — R0600 Dyspnea, unspecified: Secondary | ICD-10-CM | POA: Diagnosis present

## 2015-10-01 DIAGNOSIS — X58XXXA Exposure to other specified factors, initial encounter: Secondary | ICD-10-CM | POA: Diagnosis present

## 2015-10-01 DIAGNOSIS — Z85819 Personal history of malignant neoplasm of unspecified site of lip, oral cavity, and pharynx: Secondary | ICD-10-CM

## 2015-10-01 DIAGNOSIS — E2740 Unspecified adrenocortical insufficiency: Secondary | ICD-10-CM | POA: Diagnosis present

## 2015-10-01 DIAGNOSIS — G9340 Encephalopathy, unspecified: Secondary | ICD-10-CM | POA: Diagnosis present

## 2015-10-01 DIAGNOSIS — Z923 Personal history of irradiation: Secondary | ICD-10-CM | POA: Diagnosis not present

## 2015-10-01 DIAGNOSIS — Z9221 Personal history of antineoplastic chemotherapy: Secondary | ICD-10-CM | POA: Diagnosis not present

## 2015-10-01 DIAGNOSIS — E872 Acidosis, unspecified: Secondary | ICD-10-CM | POA: Insufficient documentation

## 2015-10-01 DIAGNOSIS — F1020 Alcohol dependence, uncomplicated: Secondary | ICD-10-CM | POA: Diagnosis present

## 2015-10-01 DIAGNOSIS — I469 Cardiac arrest, cause unspecified: Secondary | ICD-10-CM | POA: Diagnosis present

## 2015-10-01 DIAGNOSIS — C7802 Secondary malignant neoplasm of left lung: Secondary | ICD-10-CM | POA: Diagnosis present

## 2015-10-01 DIAGNOSIS — J9602 Acute respiratory failure with hypercapnia: Secondary | ICD-10-CM | POA: Diagnosis present

## 2015-10-01 DIAGNOSIS — E46 Unspecified protein-calorie malnutrition: Secondary | ICD-10-CM | POA: Diagnosis present

## 2015-10-01 DIAGNOSIS — T451X5A Adverse effect of antineoplastic and immunosuppressive drugs, initial encounter: Secondary | ICD-10-CM | POA: Diagnosis present

## 2015-10-01 DIAGNOSIS — Z681 Body mass index (BMI) 19 or less, adult: Secondary | ICD-10-CM | POA: Diagnosis not present

## 2015-10-01 DIAGNOSIS — G8929 Other chronic pain: Secondary | ICD-10-CM | POA: Diagnosis not present

## 2015-10-01 DIAGNOSIS — Z8521 Personal history of malignant neoplasm of larynx: Secondary | ICD-10-CM

## 2015-10-01 DIAGNOSIS — F411 Generalized anxiety disorder: Secondary | ICD-10-CM | POA: Diagnosis not present

## 2015-10-01 DIAGNOSIS — F331 Major depressive disorder, recurrent, moderate: Secondary | ICD-10-CM | POA: Diagnosis not present

## 2015-10-01 DIAGNOSIS — E876 Hypokalemia: Secondary | ICD-10-CM

## 2015-10-01 DIAGNOSIS — I9589 Other hypotension: Secondary | ICD-10-CM | POA: Diagnosis present

## 2015-10-01 DIAGNOSIS — R569 Unspecified convulsions: Secondary | ICD-10-CM | POA: Diagnosis present

## 2015-10-01 DIAGNOSIS — I951 Orthostatic hypotension: Secondary | ICD-10-CM

## 2015-10-01 DIAGNOSIS — F322 Major depressive disorder, single episode, severe without psychotic features: Secondary | ICD-10-CM | POA: Diagnosis present

## 2015-10-01 DIAGNOSIS — R0689 Other abnormalities of breathing: Secondary | ICD-10-CM | POA: Diagnosis not present

## 2015-10-01 DIAGNOSIS — F329 Major depressive disorder, single episode, unspecified: Secondary | ICD-10-CM | POA: Diagnosis present

## 2015-10-01 DIAGNOSIS — F419 Anxiety disorder, unspecified: Secondary | ICD-10-CM | POA: Diagnosis present

## 2015-10-01 DIAGNOSIS — D6481 Anemia due to antineoplastic chemotherapy: Secondary | ICD-10-CM | POA: Diagnosis present

## 2015-10-01 DIAGNOSIS — Z79899 Other long term (current) drug therapy: Secondary | ICD-10-CM

## 2015-10-01 DIAGNOSIS — C7801 Secondary malignant neoplasm of right lung: Secondary | ICD-10-CM | POA: Diagnosis present

## 2015-10-01 DIAGNOSIS — K219 Gastro-esophageal reflux disease without esophagitis: Secondary | ICD-10-CM | POA: Diagnosis present

## 2015-10-01 DIAGNOSIS — Z87891 Personal history of nicotine dependence: Secondary | ICD-10-CM

## 2015-10-01 DIAGNOSIS — C799 Secondary malignant neoplasm of unspecified site: Secondary | ICD-10-CM

## 2015-10-01 DIAGNOSIS — S27329A Contusion of lung, unspecified, initial encounter: Secondary | ICD-10-CM | POA: Diagnosis present

## 2015-10-01 DIAGNOSIS — D649 Anemia, unspecified: Secondary | ICD-10-CM | POA: Diagnosis present

## 2015-10-01 DIAGNOSIS — J9601 Acute respiratory failure with hypoxia: Secondary | ICD-10-CM | POA: Diagnosis present

## 2015-10-01 DIAGNOSIS — T886XXA Anaphylactic reaction due to adverse effect of correct drug or medicament properly administered, initial encounter: Secondary | ICD-10-CM | POA: Diagnosis present

## 2015-10-01 DIAGNOSIS — Z8674 Personal history of sudden cardiac arrest: Secondary | ICD-10-CM | POA: Diagnosis not present

## 2015-10-01 DIAGNOSIS — Z888 Allergy status to other drugs, medicaments and biological substances status: Secondary | ICD-10-CM | POA: Diagnosis not present

## 2015-10-01 DIAGNOSIS — Z95828 Presence of other vascular implants and grafts: Secondary | ICD-10-CM

## 2015-10-01 LAB — COMPREHENSIVE METABOLIC PANEL
ALBUMIN: 3.2 g/dL — AB (ref 3.5–5.0)
ALBUMIN: 3.1 g/dL — AB (ref 3.5–5.0)
ALK PHOS: 120 U/L (ref 40–150)
ALT: 12 U/L (ref 0–55)
ALT: 13 U/L — AB (ref 17–63)
ANION GAP: 8 meq/L (ref 3–11)
ANION GAP: 13 (ref 5–15)
AST: 18 U/L (ref 5–34)
AST: 26 U/L (ref 15–41)
Alkaline Phosphatase: 109 U/L (ref 38–126)
BILIRUBIN TOTAL: 0.38 mg/dL (ref 0.20–1.20)
BUN: 6.9 mg/dL — AB (ref 7.0–26.0)
BUN: 7 mg/dL (ref 6–20)
CALCIUM: 8.7 mg/dL (ref 8.4–10.4)
CALCIUM: 8.4 mg/dL — AB (ref 8.9–10.3)
CHLORIDE: 103 meq/L (ref 98–109)
CO2: 32 mEq/L — ABNORMAL HIGH (ref 22–29)
CO2: 26 mmol/L (ref 22–32)
CREATININE: 0.7 mg/dL (ref 0.7–1.3)
CREATININE: 0.85 mg/dL (ref 0.61–1.24)
Chloride: 105 mmol/L (ref 101–111)
EGFR: >90 mL/min/{1.73_m2} (ref >90)
GFR calc Af Amer: >60 mL/min (ref >60)
GLUCOSE: 165 mg/dL — AB (ref 65–99)
Glucose: 85 mg/dl (ref 70–140)
Potassium: 3 mEq/L — CL (ref 3.5–5.1)
Potassium: 2.7 mmol/L — CL (ref 3.5–5.1)
SODIUM: 144 mmol/L (ref 135–145)
Sodium: 143 mEq/L (ref 136–145)
TOTAL PROTEIN: 5.7 g/dL — AB (ref 6.5–8.1)
Total Bilirubin: 0.5 mg/dL (ref 0.3–1.2)
Total Protein: 6.3 g/dL — ABNORMAL LOW (ref 6.4–8.3)

## 2015-10-01 LAB — I-STAT CG4 LACTIC ACID, ED: Lactic Acid, Venous: 6.25 mmol/L (ref 0.5–2.0)

## 2015-10-01 LAB — PHOSPHORUS: Phosphorus: 5 mg/dL — ABNORMAL HIGH (ref 2.5–4.6)

## 2015-10-01 LAB — BLOOD GAS, ARTERIAL
ACID-BASE DEFICIT: 1.5 mmol/L (ref 0.0–2.0)
ACID-BASE DEFICIT: 0.3 mmol/L (ref 0.0–2.0)
BICARBONATE: 26.4 meq/L — AB (ref 20.0–24.0)
Bicarbonate: 22.3 mEq/L (ref 20.0–24.0)
Drawn by: 307971
Drawn by: 307971
FIO2: 1
FIO2: 0.4
LHR: 20 {breaths}/min
LHR: 30 {breaths}/min
O2 SAT: 99.5 %
O2 SAT: 99 %
PATIENT TEMPERATURE: 98.6
PCO2 ART: 61.2 mmHg — AB (ref 35.0–45.0)
PCO2 ART: 31.5 mmHg — AB (ref 35.0–45.0)
PEEP/CPAP: 5 cmH2O
PEEP/CPAP: 5 cmH2O
PH ART: 7.258 — AB (ref 7.350–7.450)
PH ART: 7.464 — AB (ref 7.350–7.450)
PO2 ART: 534 mmHg — AB (ref 80.0–100.0)
PO2 ART: 149 mmHg — AB (ref 80.0–100.0)
Patient temperature: 98.6
TCO2: 24.2 mmol/L (ref 0–100)
TCO2: 19.8 mmol/L (ref 0–100)
VT: 640 mL
VT: 640 mL

## 2015-10-01 LAB — URINE MICROSCOPIC-ADD ON

## 2015-10-01 LAB — URINALYSIS, ROUTINE W REFLEX MICROSCOPIC
Bilirubin Urine: NEGATIVE
Glucose, UA: NEGATIVE mg/dL
Ketones, ur: NEGATIVE mg/dL
Leukocytes, UA: NEGATIVE
Nitrite: NEGATIVE
Protein, ur: 30 mg/dL — AB
Specific Gravity, Urine: 1.011 (ref 1.005–1.030)
pH: 7 (ref 5.0–8.0)

## 2015-10-01 LAB — MAGNESIUM
MAGNESIUM: 1.9 mg/dL (ref 1.5–2.5)
Magnesium: 1.8 mg/dL (ref 1.7–2.4)

## 2015-10-01 LAB — TROPONIN I
Troponin I: 0.03 ng/mL (ref <0.031)
Troponin I: 1.7 ng/mL (ref <0.031)

## 2015-10-01 LAB — CBC WITH DIFFERENTIAL/PLATELET
BASO%: 0.4 % (ref 0.0–2.0)
BASOS ABS: 0 10*3/uL (ref 0.0–0.1)
BASOS ABS: 0 10*3/uL (ref 0.0–0.1)
BASOS PCT: 0 %
EOS ABS: 0 10*3/uL (ref 0.0–0.7)
EOS PCT: 0 %
EOS%: 2.2 % (ref 0.0–7.0)
Eosinophils Absolute: 0.1 10*3/uL (ref 0.0–0.5)
HCT: 40.7 % (ref 39.0–52.0)
HEMATOCRIT: 36.4 % — AB (ref 38.4–49.9)
HEMOGLOBIN: 12.2 g/dL — AB (ref 13.0–17.1)
Hemoglobin: 13.4 g/dL (ref 13.0–17.0)
LYMPH#: 1 10*3/uL (ref 0.9–3.3)
LYMPH%: 21.6 % (ref 14.0–49.0)
LYMPHS PCT: 41 %
Lymphs Abs: 2.8 10*3/uL (ref 0.7–4.0)
MCH: 32.5 pg (ref 27.2–33.4)
MCH: 32.1 pg (ref 26.0–34.0)
MCHC: 33.5 g/dL (ref 32.0–36.0)
MCHC: 32.9 g/dL (ref 30.0–36.0)
MCV: 97.1 fL (ref 79.3–98.0)
MCV: 97.6 fL (ref 78.0–100.0)
MONO ABS: 0.1 10*3/uL (ref 0.1–1.0)
MONO#: 0.4 10*3/uL (ref 0.1–0.9)
MONO%: 9.3 % (ref 0.0–14.0)
Monocytes Relative: 2 %
NEUT#: 3.1 10*3/uL (ref 1.5–6.5)
NEUT%: 66.5 % (ref 39.0–75.0)
Neutro Abs: 3.9 10*3/uL (ref 1.7–7.7)
Neutrophils Relative %: 57 %
PLATELETS: 226 10*3/uL (ref 140–400)
PLATELETS: 283 10*3/uL (ref 150–400)
RBC: 3.75 10*6/uL — ABNORMAL LOW (ref 4.20–5.82)
RBC: 4.17 MIL/uL — AB (ref 4.22–5.81)
RDW: 14.4 % (ref 11.0–14.6)
RDW: 14.4 % (ref 11.5–15.5)
WBC: 4.6 10*3/uL (ref 4.0–10.3)
WBC: 6.9 10*3/uL (ref 4.0–10.5)

## 2015-10-01 LAB — GLUCOSE, CAPILLARY: GLUCOSE-CAPILLARY: 77 mg/dL (ref 65–99)

## 2015-10-01 LAB — PROTIME-INR
INR: 1.31 (ref 0.00–1.49)
Prothrombin Time: 16.4 seconds — ABNORMAL HIGH (ref 11.6–15.2)

## 2015-10-01 LAB — CBG MONITORING, ED: GLUCOSE-CAPILLARY: 142 mg/dL — AB (ref 65–99)

## 2015-10-01 LAB — LACTIC ACID, PLASMA: Lactic Acid, Venous: 4.7 mmol/L (ref 0.5–2.0)

## 2015-10-01 MED ORDER — SODIUM CHLORIDE 0.9% FLUSH
10.0000 mL | INTRAVENOUS | Status: DC | PRN
  Filled 2015-10-01: qty 10

## 2015-10-01 MED ORDER — EPINEPHRINE HCL 1 MG/ML IJ SOLN
INTRAMUSCULAR | Status: AC
  Administered 2015-10-01: 13:00:00
  Filled 2015-10-01: qty 1

## 2015-10-01 MED ORDER — ASPIRIN 300 MG RE SUPP
300.0000 mg | RECTAL | Status: AC
  Administered 2015-10-01: 300 mg via RECTAL
  Filled 2015-10-01: qty 1

## 2015-10-01 MED ORDER — SODIUM CHLORIDE 0.9 % IV SOLN
Freq: Once | INTRAVENOUS | Status: AC
  Administered 2015-10-01: 11:00:00 via INTRAVENOUS

## 2015-10-01 MED ORDER — SODIUM CHLORIDE 0.9 % IV SOLN
25.0000 ug/h | INTRAVENOUS | Status: DC
  Administered 2015-10-01: 50 ug/h via INTRAVENOUS
  Filled 2015-10-01 (×2): qty 50

## 2015-10-01 MED ORDER — ASPIRIN 81 MG PO CHEW: 324.0000 mg | CHEWABLE_TABLET | ORAL | Status: AC

## 2015-10-01 MED ORDER — LORAZEPAM 2 MG/ML IJ SOLN
2.0000 mg | Freq: Once | INTRAMUSCULAR | Status: AC
  Administered 2015-10-01: 2 mg via INTRAVENOUS

## 2015-10-01 MED ORDER — MORPHINE SULFATE 15 MG PO TABS: 15.0000 mg | ORAL_TABLET | ORAL | Status: DC | PRN

## 2015-10-01 MED ORDER — ONDANSETRON HCL 4 MG/2ML IJ SOLN: 4.0000 mg | Freq: Four times a day (QID) | INTRAMUSCULAR | Status: DC | PRN

## 2015-10-01 MED ORDER — SODIUM CHLORIDE 0.9% FLUSH
10.0000 mL | INTRAVENOUS | Status: AC | PRN
  Administered 2015-10-01: 10 mL via INTRAVENOUS
  Filled 2015-10-01: qty 10

## 2015-10-01 MED ORDER — ALBUTEROL SULFATE (2.5 MG/3ML) 0.083% IN NEBU: 2.5000 mg | INHALATION_SOLUTION | RESPIRATORY_TRACT | Status: DC | PRN

## 2015-10-01 MED ORDER — DEXTROSE 5 % IV SOLN
0.5000 ug/min | INTRAVENOUS | Status: DC
  Administered 2015-10-01: 4 ug/min via INTRAVENOUS
  Filled 2015-10-01 (×2): qty 4

## 2015-10-01 MED ORDER — VANCOMYCIN HCL IN DEXTROSE 1-5 GM/200ML-% IV SOLN
1000.0000 mg | Freq: Once | INTRAVENOUS | Status: AC
  Administered 2015-10-01: 1000 mg via INTRAVENOUS
  Filled 2015-10-01: qty 200

## 2015-10-01 MED ORDER — LORAZEPAM 2 MG/ML IJ SOLN
INTRAMUSCULAR | Status: AC
  Administered 2015-10-01: 2 mg
  Filled 2015-10-01: qty 1

## 2015-10-01 MED ORDER — PALONOSETRON HCL INJECTION 0.25 MG/5ML: 0.2500 mg | Freq: Once | INTRAVENOUS | Status: DC

## 2015-10-01 MED ORDER — SODIUM CHLORIDE 0.9 % IV BOLUS (SEPSIS)
1000.0000 mL | INTRAVENOUS | Status: AC
  Administered 2015-10-01 (×2): 1000 mL via INTRAVENOUS

## 2015-10-01 MED ORDER — FENTANYL CITRATE (PF) 100 MCG/2ML IJ SOLN: 100.0000 ug | INTRAMUSCULAR | Status: DC | PRN

## 2015-10-01 MED ORDER — MIDAZOLAM HCL 2 MG/2ML IJ SOLN
2.0000 mg | INTRAMUSCULAR | Status: DC | PRN
  Administered 2015-10-01: 2 mg via INTRAVENOUS
  Filled 2015-10-01: qty 2

## 2015-10-01 MED ORDER — IPRATROPIUM-ALBUTEROL 0.5-2.5 (3) MG/3ML IN SOLN
3.0000 mL | Freq: Four times a day (QID) | RESPIRATORY_TRACT | Status: DC
  Administered 2015-10-01 – 2015-10-03 (×6): 3 mL via RESPIRATORY_TRACT
  Filled 2015-10-01 (×6): qty 3

## 2015-10-01 MED ORDER — CEFEPIME HCL 2 G IJ SOLR
2.0000 g | Freq: Once | INTRAMUSCULAR | Status: AC
  Administered 2015-10-01: 2 g via INTRAVENOUS
  Filled 2015-10-01: qty 2

## 2015-10-01 MED ORDER — ACETAMINOPHEN 325 MG PO TABS: 650.0000 mg | ORAL_TABLET | ORAL | Status: DC | PRN

## 2015-10-01 MED ORDER — FLUDROCORTISONE ACETATE 0.1 MG PO TABS: 0.2000 mg | ORAL_TABLET | Freq: Every day | ORAL | Status: DC

## 2015-10-01 MED ORDER — FENTANYL CITRATE (PF) 100 MCG/2ML IJ SOLN: 50.0000 ug | Freq: Once | INTRAMUSCULAR | Status: DC

## 2015-10-01 MED ORDER — MIDAZOLAM HCL 2 MG/2ML IJ SOLN
2.0000 mg | INTRAMUSCULAR | Status: DC | PRN
  Administered 2015-10-02: 2 mg via INTRAVENOUS
  Filled 2015-10-01: qty 2

## 2015-10-01 MED ORDER — MIDAZOLAM HCL 2 MG/2ML IJ SOLN
2.0000 mg | INTRAMUSCULAR | Status: DC | PRN
  Filled 2015-10-01: qty 2

## 2015-10-01 MED ORDER — SODIUM CHLORIDE 0.9 % IV SOLN
250.0000 mL | INTRAVENOUS | Status: DC | PRN
  Administered 2015-10-01: 250 mL via INTRAVENOUS

## 2015-10-01 MED ORDER — FLUDEOXYGLUCOSE F - 18 (FDG) INJECTION
7.0000 | Freq: Once | INTRAVENOUS | Status: AC | PRN
  Administered 2015-10-01: 7 via INTRAVENOUS

## 2015-10-01 MED ORDER — POTASSIUM CHLORIDE 10 MEQ/100ML IV SOLN
10.0000 meq | INTRAVENOUS | Status: AC
  Administered 2015-10-01 – 2015-10-02 (×6): 10 meq via INTRAVENOUS
  Filled 2015-10-01 (×5): qty 100

## 2015-10-01 MED ORDER — POTASSIUM CHLORIDE 20 MEQ/15ML (10%) PO SOLN
40.0000 meq | Freq: Once | ORAL | Status: DC
  Filled 2015-10-01: qty 30

## 2015-10-01 MED ORDER — SODIUM CHLORIDE 0.9 % IV BOLUS (SEPSIS)
1000.0000 mL | Freq: Once | INTRAVENOUS | Status: AC
  Administered 2015-10-01: 1000 mL via INTRAVENOUS

## 2015-10-01 MED ORDER — FAMOTIDINE IN NACL 20-0.9 MG/50ML-% IV SOLN
20.0000 mg | Freq: Two times a day (BID) | INTRAVENOUS | Status: DC
  Administered 2015-10-01 – 2015-10-03 (×5): 20 mg via INTRAVENOUS
  Filled 2015-10-01 (×5): qty 50

## 2015-10-01 MED ORDER — SODIUM CHLORIDE 0.9 % IV SOLN
10.0000 mg | Freq: Once | INTRAVENOUS | Status: DC
  Filled 2015-10-01: qty 1

## 2015-10-01 MED ORDER — HEPARIN SODIUM (PORCINE) 5000 UNIT/ML IJ SOLN
5000.0000 [IU] | Freq: Three times a day (TID) | INTRAMUSCULAR | Status: DC
  Administered 2015-10-01 – 2015-10-03 (×6): 5000 [IU] via SUBCUTANEOUS
  Filled 2015-10-01 (×8): qty 1

## 2015-10-01 MED ORDER — FENTANYL 25 MCG/HR TD PT72: 25.0000 ug | MEDICATED_PATCH | TRANSDERMAL | Status: DC

## 2015-10-01 MED ORDER — CETUXIMAB CHEMO IV INJECTION 200 MG/100ML
400.0000 mg/m2 | Freq: Once | INTRAVENOUS | Status: AC
  Administered 2015-10-01: 700 mg via INTRAVENOUS
  Filled 2015-10-01: qty 250

## 2015-10-01 MED ORDER — DIPHENHYDRAMINE HCL 50 MG/ML IJ SOLN
25.0000 mg | Freq: Two times a day (BID) | INTRAMUSCULAR | Status: DC
Start: 2015-10-01 — End: 2015-10-03
  Administered 2015-10-01 – 2015-10-03 (×5): 25 mg via INTRAVENOUS
  Filled 2015-10-01 (×5): qty 1

## 2015-10-01 MED ORDER — SODIUM CHLORIDE 0.9 % IV SOLN
1000.0000 mg/m2/d | INTRAVENOUS | Status: DC
  Filled 2015-10-01: qty 144

## 2015-10-01 MED ORDER — POTASSIUM CHLORIDE CRYS ER 20 MEQ PO TBCR: 20.0000 meq | EXTENDED_RELEASE_TABLET | Freq: Two times a day (BID) | ORAL | Status: DC

## 2015-10-01 MED ORDER — HYDROCORTISONE NA SUCCINATE PF 100 MG IJ SOLR
50.0000 mg | Freq: Once | INTRAMUSCULAR | Status: AC
  Administered 2015-10-01: 50 mg via INTRAVENOUS
  Filled 2015-10-01: qty 2

## 2015-10-01 MED ORDER — SODIUM CHLORIDE 0.9 % IV SOLN: Freq: Once | INTRAVENOUS | Status: DC

## 2015-10-01 MED ORDER — LORAZEPAM 2 MG/ML IJ SOLN
INTRAMUSCULAR | Status: AC
  Administered 2015-10-01: 2 mg
  Filled 2015-10-01: qty 2

## 2015-10-01 MED ORDER — MIDAZOLAM HCL 2 MG/2ML IJ SOLN
2.0000 mg | INTRAMUSCULAR | Status: DC | PRN
Start: 2015-10-01 — End: 2015-10-01
  Administered 2015-10-01: 2 mg via INTRAVENOUS
  Filled 2015-10-01: qty 2

## 2015-10-01 MED ORDER — SODIUM CHLORIDE 0.9 % IV SOLN
670.0000 mg | Freq: Once | INTRAVENOUS | Status: DC
  Filled 2015-10-01: qty 67

## 2015-10-01 MED ORDER — FENTANYL BOLUS VIA INFUSION
50.0000 ug | INTRAVENOUS | Status: DC | PRN
  Filled 2015-10-01: qty 50

## 2015-10-01 MED ORDER — SODIUM CHLORIDE 0.9 % IV SOLN
INTRAVENOUS | Status: DC
  Administered 2015-10-01: 17:00:00 via INTRAVENOUS

## 2015-10-01 MED ORDER — SENNOSIDES 8.8 MG/5ML PO SYRP
5.0000 mL | ORAL_SOLUTION | Freq: Two times a day (BID) | ORAL | Status: DC | PRN
  Filled 2015-10-01: qty 5

## 2015-10-01 MED ORDER — ASPIRIN 325 MG PO TABS
325.0000 mg | ORAL_TABLET | Freq: Once | ORAL | Status: AC
  Administered 2015-10-01: 325 mg via ORAL
  Filled 2015-10-01: qty 1

## 2015-10-01 MED ORDER — METHYLPREDNISOLONE SODIUM SUCC 125 MG IJ SOLR
60.0000 mg | Freq: Three times a day (TID) | INTRAMUSCULAR | Status: DC
  Administered 2015-10-01 – 2015-10-03 (×7): 60 mg via INTRAVENOUS
  Filled 2015-10-01 (×7): qty 2

## 2015-10-01 MED ORDER — HEPARIN SOD (PORK) LOCK FLUSH 100 UNIT/ML IV SOLN
500.0000 [IU] | Freq: Once | INTRAVENOUS | Status: DC | PRN
  Filled 2015-10-01: qty 5

## 2015-10-01 MED ORDER — DIPHENHYDRAMINE HCL 50 MG/ML IJ SOLN
50.0000 mg | Freq: Once | INTRAMUSCULAR | Status: AC
  Administered 2015-10-01: 50 mg via INTRAVENOUS

## 2015-10-01 MED ORDER — DIPHENHYDRAMINE HCL 50 MG/ML IJ SOLN
INTRAMUSCULAR | Status: AC
Start: 2015-10-01 — End: 2015-10-01
  Filled 2015-10-01: qty 1

## 2015-10-01 MED FILL — MORPHINE SULFATE IR 15 MG T: 15 | 10 days supply | Qty: 60 | Fill #0

## 2015-10-01 MED FILL — FLUDROCORTISONE 0.1 MG TAB: 0.1 | 30 days supply | Qty: 60 | Fill #0

## 2015-10-01 MED FILL — fentaNYL 25 MCG/HR PT72: 25 | 15 days supply | Qty: 5 | Fill #0

## 2015-10-01 MED FILL — Medication: Qty: 1 | Status: AC

## 2015-10-01 NOTE — Assessment & Plan Note (Signed)
This has been improved on Florinef and adequate oral intake. Continue to same. I refill his prescription today

## 2015-10-01 NOTE — ED Notes (Signed)
MD at bedside. 

## 2015-10-01 NOTE — ED Provider Notes (Addendum)
[facility]  Department of Emergency Medicine   Code Blue CONSULT NOTE  Chief Complaint: Cardiac arrest/unresponsive   Level V Caveat: Unresponsive  History of present illness: I was contacted by the hospital for a CODE BLUE cardiac arrest upstairs and presented to the patient's bedside.    ROS: Unable to obtain, Level V caveat  Scheduled Meds: . antiseptic oral rinse  7 mL Mouth Rinse QID  . chlorhexidine gluconate  15 mL Mouth Rinse BID  . diphenhydrAMINE  25 mg Intravenous BID  . famotidine (PEPCID) IV  20 mg Intravenous Q12H  . fentaNYL (SUBLIMAZE) injection  50 mcg Intravenous Once  . heparin  5,000 Units Subcutaneous 3 times per day  . ipratropium-albuterol  3 mL Nebulization Q6H  . methylPREDNISolone (SOLU-MEDROL) injection  60 mg Intravenous 3 times per day  . potassium chloride  40 mEq Per Tube Once   Continuous Infusions: . sodium chloride 50 mL/hr at 10/01/15 1654  . epinephrine Stopped (10/01/15 2220)  . fentaNYL infusion INTRAVENOUS 75 mcg/hr (10/02/15 0712)   PRN Meds:.sodium chloride, acetaminophen, albuterol, fentaNYL, fentaNYL (SUBLIMAZE) injection, fentaNYL (SUBLIMAZE) injection, midazolam, midazolam, ondansetron (ZOFRAN) IV, sennosides Past Medical History  Diagnosis Date  . ETOH abuse   . Alcohol dependence ([place]) 04/17/2012  . COPD (chronic obstructive pulmonary disease) ([place])   . Shortness of breath dyspnea     occ  . Anxiety   . GERD (gastroesophageal reflux disease)   . Seizures ([place])     one episode 09/2012, suspected related to ETOH withdrawal  . Anxiety disorder 03/12/2015  . Headache 05/10/2015  . Hypokalemia 05/10/2015  . 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 Cancer ([place])   . Throat cancer ([place])   . Severe major depression, single episode, without psychotic features ([place]) 09/13/2015   Past Surgical History  Procedure Laterality Date  . Knee surgery Left     kneecap -screws  . Appendectomy    . Tracheostomy tube placement  N/A 02/22/2015    Procedure: TRACHEOSTOMY;  Surgeon: [name], MD;  Location: [facility];  Service: ENT;  Laterality: N/A;  awake tracheostomy  . Direct laryngoscopy N/A 02/22/2015    Procedure: DIRECT LARYNGOSCOPY;  Surgeon: [name], MD;  Location: [facility];  Service: ENT;  Laterality: N/A;  direct laryngoscopy with biopsy  . Multiple extractions with alveoloplasty N/A 03/05/2015    Procedure: Extraction of tooth #'s 1,2,8,9,15,16,17,18,31,32 with alveoloplasty, mandibular right lateral exostoses, and gross debridement of remaining teeth;  Surgeon: Lenn Cal, DDS;  Location: [facility];  Service: Oral Surgery;  Laterality: N/A;   Social History   Social History  . Marital Status: Single    Spouse Name: N/A  . Number of Children: N/A  . Years of Education: N/A   Occupational History  . Not on file.   Social History Main Topics  . Smoking status: Former Smoker -- 1.00 packs/day for 28 years    Types: Cigarettes    Quit date: 01/17/2015  . Smokeless tobacco: Never Used  . Alcohol Use: No     Comment: 6 -40-oz. beers daily  . Drug Use: No  . Sexual Activity: No     Comment: Velva Harman (mother), Judeen Hammans (aunt), Thayer Headings (aunt), Clare Gandy (brother) and Freida Busman (sister). Lives with borther   Other Topics Concern  . Not on file   Social History Narrative   Epworth sleepiness scale as of 07/16/15 14   Allergies  Allergen Reactions  . Cetuximab Anaphylaxis    Last set of Vital  Signs (not current) Filed Vitals:   10/02/15 0400 10/02/15 0500  BP: 117/77 111/76  Pulse: 57 60  Temp: 99 F (37.2 C) 98.4 F (36.9 C)  Resp: 20 20      Physical Exam Gen: unresponsive Cardiovascular: pulseless  Resp: apneic. Breath sounds equal bilaterally with bagging  Abd: nondistended  Neuro: GCS 3, unresponsive. HEENT: No blood in posterior pharynx, gag reflex absent  Neck: No crepitus  Musculoskeletal: No deformity  Skin: warm  Procedures  INTUBATION Performed by: Anesthesia  CRITICAL  CARE Performed by: Varney Biles - see separate note - as patient was transferred to the ER.  Cardiopulmonary Resuscitation (CPR) Procedure Note  Directed/Performed by: Varney Biles I personally directed ancillary staff and/or performed CPR in an effort to regain return of spontaneous circulation and to maintain cardiac, neuro and systemic perfusion.    Medical Decision making   Code blue was called from the cancer center.  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 Pt was pre-treated with benadryl and then 30 minutes into the infusion he became unresponsive and went pulseless.  When we arrived, CPR was in process. Anesthesia at bedside and placed an ET tube.  After 1 round of chest compression and epi we had ROSC. PT was moved to the ER.  While bringing patient to the ER, he woke up. He was agitated and trying to pull the tube out.  Cardiopulmonary Resuscitation (CPR) Procedure Note Directed/Performed by: Varney Biles I personally directed ancillary staff and/or performed CPR in an effort to regain return of spontaneous circulation and to maintain cardiac, neuro and systemic perfusion.    Varney Biles, MD 10/01/15 1759  Varney Biles, MD 10/02/15 1055

## 2015-10-01 NOTE — Anesthesia Procedure Notes (Signed)
Procedure Name: Intubation Date/Time: 10/01/2015 12:42 PM Performed by: Carleene Cooper A Pre-anesthesia Checklist: Patient identified, Emergency Drugs available, Suction available, Patient being monitored and Timeout performed Patient Re-evaluated:Patient Re-evaluated prior to inductionPreoxygenation: Pre-oxygenation with 100% oxygen Intubation Type: IV induction Laryngoscope Size: Mac and 4 Grade View: Grade I Tube type: Subglottic suction tube Tube size: 7.5 mm Number of attempts: 1 Airway Equipment and Method: Stylet Placement Confirmation: ETT inserted through vocal cords under direct vision,  breath sounds checked- equal and bilateral and CO2 detector Secured at: 23 cm Tube secured with: Tape Dental Injury: Teeth and Oropharynx as per pre-operative assessment

## 2015-10-01 NOTE — Progress Notes (Signed)
Patient developed anaphylactic reaction during cetuximab infusion. He was resuscitated with CPR and intubated in the infusion room and subsequently transported to the resuscitation bay in the emergency room at [facility]. I have discussed this case with the accepting ER physician and recommended high-dose steroid infusion for the next 24-48 hours Will follow tomorrow

## 2015-10-01 NOTE — ED Notes (Signed)
Pt receiving infusion at cancer center when he went unresponsive. CPR done at cancer center. Pt successfully resuscitated. Pt brought to Res B.

## 2015-10-01 NOTE — Assessment & Plan Note (Addendum)
The decision was made based on publication at the [facility]. It is a category 1 recommendation from NCCN. Vermorken, et al. Alison Stalling J Med [phone]  The chemotherapy consists of   1. Carboplatin (at an area under the curve of 5 mg per milliliter per minute, as a 1-hour intravenous infusion on day 1) plus  2. Fluorouracil (at a dose of 1000 mg per square meter per day for 4 days) every 3 weeks for a maximum of 6 cycles  3. Cetuximab (at a dose of 400 mg per square meter initially, as a 2-hour intravenous infusion, then 250 mg per square meter, as a 1-hour intravenous infusion per week) for a maximum of 6 cycles.   Patients with stable disease who received chemotherapy plus cetuximab continued to receive cetuximab until disease progression or unacceptable toxic effects, whichever occurred first.  Adding cetuximab to platinum-based chemotherapy with fluorouracil (platinum-fluorouracil) significantly prolonged the median overall survival from 7.4 months in the chemotherapy-alone group to 10.1 months in the group that received chemotherapy plus cetuximab (hazard ratio for death, 0.80; 95% confidence interval, 0.64 to 0.99; P=0.04).   The addition of cetuximab prolonged the median progression-free survival time from 3.3 to 5.6 months (hazard ratio for progression, 0.54; P<0.001) and increased the response rate from 20% to 36% (P<0.001).   There were no cetuximab-related deaths. I plan to give him 2-3 cycles of chemotherapy before repeat staging PET scan I plan to see him on a weekly basis to provide supportive care

## 2015-10-01 NOTE — Patient Instructions (Signed)
[facility] Discharge Instructions for Patients Receiving Chemotherapy  Today you received the following chemotherapy agents   To help prevent nausea and vomiting after your treatment, we encourage you to take your nausea medication erbitux, carboplatin, 5Fu   If you develop nausea and vomiting that is not controlled by your nausea medication, call the clinic.   BELOW ARE SYMPTOMS THAT SHOULD BE REPORTED IMMEDIATELY:  *FEVER GREATER THAN 100.5 F  *CHILLS WITH OR WITHOUT FEVER  NAUSEA AND VOMITING THAT IS NOT CONTROLLED WITH YOUR NAUSEA MEDICATION  *UNUSUAL SHORTNESS OF BREATH  *UNUSUAL BRUISING OR BLEEDING  TENDERNESS IN MOUTH AND THROAT WITH OR WITHOUT PRESENCE OF ULCERS  *URINARY PROBLEMS  *BOWEL PROBLEMS  UNUSUAL RASH Items with * indicate a potential emergency and should be followed up as soon as possible.  Feel free to call the clinic you have any questions or concerns. The clinic phone number is [phone].  Please show the [place] at check-in to the Emergency Department and triage nurse.

## 2015-10-01 NOTE — Assessment & Plan Note (Signed)
He has generalized anxiety disorder and was prescribed Prozac. No follow-up was noted in his chart. I will get my Navigator team to ensure that he had an appointment for follow-up and medication adjustment.

## 2015-10-01 NOTE — Progress Notes (Addendum)
  Oncology Nurse Navigator Documentation  Navigator Location: [facility] (10/01/15 1005) Navigator Encounter Type: Clinic/MDC (10/01/15 1005)           Patient Visit Type: MedOnc (10/01/15 1005)     Met with Marvin [name] during appt with Dr. Alvy Bimler.  His aunt, Marvin [name], accompanied him. They verbalized understanding of Dr. Calton Dach:  Discussion that PET taken this morning confirmed metastatic disease.    Plan to proceed with tmt today as previously discussed. He noted:  PEG accidentally came out "the other day".  Dsg evident over site.  He is eating and drinking without difficulty. He understands I will check on him later in Infusion.  Addendum  Visited family in ED where Marvin [name] was taken after experiencing a reaction to Erbitux.  I provided support to family, answered their questions to the best of my ability.  I encouraged them to call me if they wished my return.   [name], RN, BSN, [facility] [place] [phone]                             Time Spent with Patient: 30 (10/01/15 1005)

## 2015-10-01 NOTE — H&P (Signed)
PULMONARY / CRITICAL CARE MEDICINE   Name: Marvin [name] MRN: [id number] DOB: [birth year]    ADMISSION DATE:  10/01/2015 CONSULTATION DATE:  10/01/15  REFERRING MD:  Dr. Kathrynn [name]   CHIEF COMPLAINT:  Cardiac Arrest   HISTORY OF PRESENT ILLNESS:   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 He was intubated and transferred to ER.    Patient was diagnosed with carcinoma of the supraglottis in 02/2015 and underwent treatment with concurrent chemoradiation with Cisplatin in 03/2015. Initially this required tracheostomy placement for airway management however tracheostomy was removed 06/2015.   In 1/182017 he presented to [facility] with hypotension.  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  He was incidentally noted to have lung metastasis on CXR at that time.  Follow up CT (1/20) confirmed new bilateral pulmonary nodules consistent with metastatic pulmonary disease (no LAN). He later was admitted to [facility] with alcohol intoxication in the setting of depression / suicidal ideation.    He is followed by Dr. Alvy [name] at the [facility] and was receiving his first round of chemotherapy on 2/13 when he suddenly went into cardiac arrest during cetuximab infusion (known side effect). He was resuscitated and intubated in the infusion room and transported to [facility] resuscitation bay.  Initial labs - Na 144, K 2.7, Cl 105, Cr 0.85, glucose 165, phos 5.0, lactic acid  6.25, troponin 0.03, WBC 6.9, Hgb 13.4 and platelets 283.  CXR demonstrated hyperinflation but no active disease.  PCCM called for ICU admission.   PAST MEDICAL HISTORY :  He  has a past medical history of ETOH abuse; Alcohol dependence (Marvin [name]) (04/17/2012); COPD (chronic obstructive pulmonary disease) (Marvin [name]); Shortness of breath dyspnea; Anxiety; GERD (gastroesophageal reflux disease); Seizures (Marvin [name]); Anxiety disorder (03/12/2015); Headache (05/10/2015); Hypokalemia (05/10/2015); Anemia due to chemotherapy (05/11/2015); Cancer (Marvin [name]); Throat cancer (Marvin [name]); and Severe major depression, single episode, without psychotic features (Marvin [name]) (09/13/2015).  PAST SURGICAL HISTORY: He  has past surgical history that includes Knee surgery (Left); Appendectomy; Tracheostomy tube placement (N/A, 02/22/2015); Direct laryngoscopy (N/A, 02/22/2015); and Multiple extractions with alveoloplasty (N/A, 03/05/2015).  Allergies  Allergen Reactions  . Cetuximab Anaphylaxis    Current Facility-Administered Medications on File Prior to Encounter  Medication  . 0.9 %  sodium chloride infusion  . sodium chloride flush (NS) 0.9 % injection 10 mL   Current Outpatient Prescriptions on File Prior to Encounter  Medication Sig  . fentaNYL (DURAGESIC - DOSED MCG/HR) 25 MCG/HR patch Place 1 patch (25 mcg total) onto the skin every 3 (three) days.  . fentaNYL (DURAGESIC - DOSED MCG/HR) 50 MCG/HR Place 1 patch (50 mcg total) onto the skin every 3 (three) days.  . fludrocortisone (FLORINEF) 0.1 MG tablet Take 2 tablets (0.2 mg total) by mouth daily.  Marland Kitchen FLUoxetine (PROZAC) 20 MG capsule Take 1 capsule (20 mg total) by mouth daily.  Marland Kitchen LORazepam (ATIVAN) 0.5 MG tablet Take 1 tablet (0.5 mg total) by mouth 3 (three) times daily.  Marland Kitchen morphine (MSIR) 15 MG tablet Take 1 tablet (15 mg  total) by mouth every 4 (four) hours as needed for severe pain.  . potassium chloride SA (K-DUR,KLOR-CON) 20 MEQ tablet Take 1 tablet (20 mEq total) by mouth 2 (two)  times daily.  . traZODone (DESYREL) 100 MG tablet Take 1 tablet (100 mg total) by mouth at bedtime as needed for sleep.    FAMILY HISTORY:  His indicated that his mother is alive. He indicated that his maternal grandmother is deceased. He indicated that his maternal grandfather is deceased. He indicated that his paternal grandfather is deceased.   SOCIAL HISTORY: He  reports that he quit smoking about 8 months ago. His smoking use included Cigarettes. He has a 28 pack-year smoking history. He has never used smokeless tobacco. He reports that he does not drink alcohol or use illicit drugs.  REVIEW OF SYSTEMS:   Unable to complete as patient is altered on mechanical ventilation.   SUBJECTIVE:  RN reports hypotension despite epi gtt  VITAL SIGNS: BP 97/71 mmHg  Pulse 125  Resp 17  SpO2 97%  HEMODYNAMICS:    VENTILATOR SETTINGS: Vent Mode:  [-] PRVC FiO2 (%):  [100 %] 100 % Set Rate:  [20 bmp] 20 bmp Vt Set:  [640 mL] 640 mL PEEP:  [5 cmH20] 5 cmH20 Plateau Pressure:  [17 cmH20] 17 cmH20  INTAKE / OUTPUT:    PHYSICAL EXAMINATION: General:  Thin adult male in NAD on vent  Neuro:  Sedate, arouses to voice, nods appropriately HEENT:  ETT, MM pink/moist, old trach scar  Cardiovascular:  s1s2 rrr, no m/r/g  Lungs:  Even/non-labored, vent assisted breaths Abdomen:  Flat, soft, old PEG site c/d/i  Musculoskeletal:  No acute deformities, rigid extremities with flexion attempts  Skin:  Warm/dry, radiation changes noted to neck  LABS:  BMET  Recent Labs Lab 10/01/15 0927 10/01/15 1331  NA 143 144  K 3.0* 2.7*  CL  --  105  CO2 32* 26  BUN 6.9* 7  CREATININE 0.7 0.85  GLUCOSE 85 165*    Electrolytes  Recent Labs Lab 10/01/15 0927 10/01/15 1331  CALCIUM 8.7 8.4*  MG 1.9 1.8  PHOS  --  5.0*    CBC  Recent Labs Lab 10/01/15 0926 10/01/15 1331  WBC 4.6 6.9  HGB 12.2* 13.4  HCT 36.4* 40.7  PLT 226 283    Coag's  Recent Labs Lab 10/01/15 1331  INR  1.31    Sepsis Markers  Recent Labs Lab 10/01/15 1348  LATICACIDVEN 6.25*    ABG  Recent Labs Lab 10/01/15 1430  PHART 7.258*  PCO2ART 61.2*  PO2ART 534*    Liver Enzymes  Recent Labs Lab 10/01/15 0927 10/01/15 1331  AST 18 26  ALT 12 13*  ALKPHOS 120 109  BILITOT 0.38 0.5  ALBUMIN 3.2* 3.1*    Cardiac Enzymes  Recent Labs Lab 10/01/15 1331  TROPONINI 0.03    Glucose  Recent Labs Lab 10/01/15 0749 10/01/15 1321  GLUCAP 77 142*    Imaging Nm Pet Image Restag (ps) Skull Base To Thigh  10/01/2015  CLINICAL DATA:  Supraglottic cancer.  Subsequent treatment strategy EXAM: NUCLEAR MEDICINE PET SKULL BASE TO THIGH TECHNIQUE: 7.0 mCi F-18 FDG was injected intravenously. Full-ring PET imaging was performed from the skull base to thigh after the radiotracer. CT data was obtained and used for attenuation correction and anatomic localization. FASTING BLOOD GLUCOSE:  Value: 77 mg/dl COMPARISON:  03/09/2015 FINDINGS: NECK No hypermetabolic lymph nodes in the neck. Moderate mucosal thickening involving the right maxillary  sinus identified. CHEST No hypermetabolic mediastinal lymph nodes. Multi focal hypermetabolic pulmonary nodules are identified which are new from previous exam and suspicious for metastatic disease. Index right upper lobe nodule measures 11 mm and has an SUV max equal to 13.7. Index nodule in the left upper lobe measures 7 mm and has an SUV max equal to 4.08. Index nodule in the right middle lobe measures 8 mm and has an SUV max equal to 3.14.Perihilar nodule within the left lower lobe measures 1.6 cm and has an SUV max equal to 13.8. ABDOMEN/PELVIS No abnormal hypermetabolic activity within the liver, pancreas, adrenal glands, or spleen. No hypermetabolic lymph nodes in the abdomen or pelvis. SKELETON No focal hypermetabolic activity to suggest skeletal metastasis. IMPRESSION: 1. Interval developed multi focal hypermetabolic pulmonary nodules in both lungs  compatible with metastatic disease. 2. Right maxillary sinus inflammation. Electronically Signed   By: Kerby Moors M.D.   On: 10/01/2015 10:01   Dg Chest Port 1 View  10/01/2015  CLINICAL DATA:  Dyspnea EXAM: PORTABLE CHEST 1 VIEW COMPARISON:  CT scan 09/07/2015 FINDINGS: Cardiomediastinal silhouette is stable. Hyperinflation again noted. Bilateral pulmonary nodules are again noted. No acute infiltrate or pulmonary edema. There is right IJ Port-A-Cath with tip in distal SVC. Endotracheal tube in place with tip 4.8 cm above the carina. There is NG tube in place. IMPRESSION: Hyperinflation. No active disease. Endotracheal and NG tube in place. No pneumothorax. Right IJ Port-A-Cath in place. Bilateral small pulmonary nodules again noted. Electronically Signed   By: Lahoma Crocker M.D.   On: 10/01/2015 15:08     STUDIES:    CULTURES: BCx2 2/13 >>  UC 2/13 >>  ANTIBIOTICS: Cefepime 2/13 x1 Vanco 2/13 x1   SIGNIFICANT EVENTS: 2/13  Admit from cancer center after cardiac arrest with initiation of cetuximab  LINES/TUBES: ETT 2/13 >>  [facility] 2/13 >>   DISCUSSION: 40 y/o M with PMH of ETOH abuse, depression, & Stage IV carcinoma of the supraglottis with mets to the lungs admitted 2/13 after arresting with initiation of cetuximab at the cancer center.  ROSC after 5-6 minutes (estimated) of CPR, intubated and tx to ER.    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  Former Tobacco Abuse  P:   MV support 8cc/kg  Adjust rate to 20 post ABG Wean PEEP / FiO2 for sats > 90% Intermittent CXR  Duoneb Q6 with Q3 PRN albuterol  Scheduled solu-medrol, benadryl & pepcid for anaphylaxis  Daily SBT/WUA   CARDIOVASCULAR A:  Cardiac Arrest - in  setting of anaphylactic reaction Chronic Hypotension - on Florinef  P:  ICU monitoring of hemodynamics  Assess ECHO, troponin  NS at 50 ml/hr Hold further volume resuscitation with known autonomic instability No indication for therapeutic hypothermia   RENAL A:   At Risk AKI - in setting of cardiac arrest, hypotension  Lactic Acidosis  Hypokalemia  P:   Assess lactic acid trend  Gentle hydration  NS @ 50 ml/hr Trend BMP / UOP  Replace electrolytes as indicated, KCL 40 mEq x1  GASTROINTESTINAL A:   Hx Dysphagia, PEG  At Risk Protein Calorie Malnutrition P:   NPO OGT  Consider TF in am 2/14 if remains intubated  PPI BID in setting of suspected anaphylactic reaction   HEMATOLOGIC A:  No acute issues  P:  Heparin for DVT prophylaxis  Monitor for bleeding Trend CBC  INFECTIOUS A:   No indication of acute infectious process  P:   Hold further abx Monitor fever curve / WBC  ENDOCRINE A:   Hyperglycemia with no hx of DM    P:   Monitor glucose on BMP  May need dextrose added to IVF  NEUROLOGIC A:   Acute Encephalopathy  ? Seizure Activity in ER - none noted on exam P:   RASS goal: -1  PRN versed for sedation / seizure PRN fentanyl for pain    FAMILY  - Updates:  Family updated Dr. Corrie Dandy at [place] family meet or Palliative Care meeting due by:  10/08/15    Noe Gens, NP-C [facility] Pgr: [phone] or if no answer [phone] 10/01/2015, 4:09 PM

## 2015-10-01 NOTE — Assessment & Plan Note (Signed)
He felt that his depression is not under control and he is not suicidal. He will continue medication as prescribed by recent psychiatrist I will make sure he gets a follow-up appointment for medication adjustment

## 2015-10-01 NOTE — Progress Notes (Signed)
Error

## 2015-10-01 NOTE — ED Provider Notes (Signed)
CSN: [id number]     Arrival date & time 10/01/15  1246 History   First MD Initiated Contact with Patient 10/01/15 1301     Chief Complaint  Patient presents with  . Respiratory Distress     (Consider location/radiation/quality/duration/timing/severity/associated sxs/prior Treatment) HPI Comments: Pt with hx of stege 4 supraglottic CA brought in to the ER post arrest.  PLEASE SEE THE SEPARATE NOTE ABOUT CODE BLUE.  Pt was rushed to the ER post intubation and ROSC. En route pt was agitated, and alert. He was being physically restrained while being carted to the ER - but he managed to pull the tube and it was felt that the tube was insecure. We decided to extubate patient. Patient in resp distress post extubation. Reports he had trouble breathing. Following some simple commands, but not completely alert, and no cooperative with hx or exam. Pt kept moving around and we had to apply non violent restraints. Pt's BP and O2 sats dropping, despite on NRB. Decision was made to intubate.  Prior to getting to the ER, pt was getting chemo. Oncologist believes that pt likely had an anaphylactic reaction.   The history is provided by medical records and a caregiver. The history is limited by the condition of the patient.    Past Medical History  Diagnosis Date  . ETOH abuse   . Alcohol dependence ([place]) 04/17/2012  . COPD (chronic obstructive pulmonary disease) ([place])   . Shortness of breath dyspnea     occ  . Anxiety   . GERD (gastroesophageal reflux disease)   . Seizures ([place])     one episode 09/2012, suspected related to ETOH withdrawal  . Anxiety disorder 03/12/2015  . Headache 05/10/2015  . Hypokalemia 05/10/2015  . 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 Cancer ([place])   . Throat cancer ([facility])   . Severe major depression, single episode, without psychotic features ([place]) 09/13/2015   Past Surgical History  Procedure Laterality Date  . Knee surgery Left     kneecap -screws  . Appendectomy     . Tracheostomy tube placement N/A 02/22/2015    Procedure: TRACHEOSTOMY;  Surgeon: [name], MD;  Location: [facility];  Service: ENT;  Laterality: N/A;  awake tracheostomy  . Direct laryngoscopy N/A 02/22/2015    Procedure: DIRECT LARYNGOSCOPY;  Surgeon: [name], MD;  Location: [facility];  Service: ENT;  Laterality: N/A;  direct laryngoscopy with biopsy  . Multiple extractions with alveoloplasty N/A 03/05/2015    Procedure: Extraction of tooth #'s 1,2,8,9,15,16,17,18,31,32 with alveoloplasty, mandibular right lateral exostoses, and gross debridement of remaining teeth;  Surgeon: Lenn Cal, DDS;  Location: [facility];  Service: Oral Surgery;  Laterality: N/A;   Family History  Problem Relation Age of Onset  . Cancer Mother   . Heart attack Maternal Grandmother     age of death 60  . Heart attack Maternal Grandfather   . Cancer Paternal Grandfather    Social History  Substance Use Topics  . Smoking status: Former Smoker -- 1.00 packs/day for 28 years    Types: Cigarettes    Quit date: 01/17/2015  . Smokeless tobacco: Never Used  . Alcohol Use: No     Comment: 6 -40-oz. beers daily    Review of Systems  Unable to perform ROS: Acuity of condition      Allergies  Cetuximab  Home Medications   Prior to Admission medications   Medication Sig Start Date End Date Taking? Authorizing Provider  fentaNYL ([place] -  DOSED MCG/HR) 50 MCG/HR Place 1 patch (50 mcg total) onto the skin every 3 (three) days. 09/17/15  Yes Hildred Priest, MD  FLUoxetine (PROZAC) 20 MG capsule Take 1 capsule (20 mg total) by mouth daily. 09/17/15  Yes Hildred Priest, MD  LORazepam (ATIVAN) 0.5 MG tablet Take 1 tablet (0.5 mg total) by mouth 3 (three) times daily. 09/17/15  Yes Hildred Priest, MD  traZODone (DESYREL) 100 MG tablet Take 1 tablet (100 mg total) by mouth at bedtime as needed for sleep. 09/17/15  Yes Hildred Priest, MD  fentaNYL (DURAGESIC - DOSED MCG/HR)  25 MCG/HR patch Place 1 patch (25 mcg total) onto the skin every 3 (three) days. 10/01/15   [name], MD  fludrocortisone (FLORINEF) 0.1 MG tablet Take 2 tablets (0.2 mg total) by mouth daily. 10/01/15   [name], MD  morphine (MSIR) 15 MG tablet Take 1 tablet (15 mg total) by mouth every 4 (four) hours as needed for severe pain. 10/01/15   [name], MD  potassium chloride SA (K-DUR,KLOR-CON) 20 MEQ tablet Take 1 tablet (20 mEq total) by mouth 2 (two) times daily. 10/01/15   Ni Gorsuch, MD   BP 129/88 mmHg  Pulse 84  Resp 21  SpO2 100% Physical Exam  Constitutional: He appears well-developed.  HENT:  Head: Atraumatic.  Eyes: Conjunctivae are normal.  Neck: Neck supple.  Cardiovascular: Intact distal pulses.   tachycardia  Pulmonary/Chest: Effort normal.  Skin: Skin is warm.  Nursing note and vitals reviewed.   ED Course  Procedures (including critical care time) Labs Review Labs Reviewed  CBC WITH DIFFERENTIAL/PLATELET - Abnormal; Notable for the following:    RBC 4.17 (*)    All other components within normal limits  COMPREHENSIVE METABOLIC PANEL - Abnormal; Notable for the following:    Potassium 2.7 (*)    Glucose, Bld 165 (*)    Calcium 8.4 (*)    Total Protein 5.7 (*)    Albumin 3.1 (*)    ALT 13 (*)    All other components within normal limits  PHOSPHORUS - Abnormal; Notable for the following:    Phosphorus 5.0 (*)    All other components within normal limits  BLOOD GAS, ARTERIAL - Abnormal; Notable for the following:    pH, Arterial 7.258 (*)    pCO2 arterial 61.2 (*)    pO2, Arterial 534 (*)    Bicarbonate 26.4 (*)    All other components within normal limits  PROTIME-INR - Abnormal; Notable for the following:    Prothrombin Time 16.4 (*)    All other components within normal limits  URINALYSIS, ROUTINE W REFLEX MICROSCOPIC (NOT AT [facility]) - Abnormal; Notable for the following:    APPearance CLOUDY (*)    Hgb urine dipstick TRACE (*)    Protein, ur 30 (*)     All other components within normal limits  BLOOD GAS, ARTERIAL - Abnormal; Notable for the following:    pH, Arterial 7.464 (*)    pCO2 arterial 31.5 (*)    pO2, Arterial 149 (*)    All other components within normal limits  URINE MICROSCOPIC-ADD ON - Abnormal; Notable for the following:    Squamous Epithelial / LPF 0-5 (*)    Bacteria, UA RARE (*)    Casts GRANULAR CAST (*)    All other components within normal limits  I-STAT CG4 LACTIC ACID, ED - Abnormal; Notable for the following:    Lactic Acid, Venous 6.25 (*)    All other components  within normal limits  CBG MONITORING, ED - Abnormal; Notable for the following:    Glucose-Capillary 142 (*)    All other components within normal limits  CULTURE, BLOOD (ROUTINE X 2)  CULTURE, BLOOD (ROUTINE X 2)  URINE CULTURE  TROPONIN I  MAGNESIUM  CBC  BASIC METABOLIC PANEL  MAGNESIUM  PHOSPHORUS  BLOOD GAS, ARTERIAL  TROPONIN I  TROPONIN I  LACTIC ACID, PLASMA  LACTIC ACID, PLASMA  I-STAT CG4 LACTIC ACID, ED    Imaging Review Nm Pet Image Restag (ps) Skull Base To Thigh  10/01/2015  CLINICAL DATA:  Supraglottic cancer.  Subsequent treatment strategy EXAM: NUCLEAR MEDICINE PET SKULL BASE TO THIGH TECHNIQUE: 7.0 mCi F-18 FDG was injected intravenously. Full-ring PET imaging was performed from the skull base to thigh after the radiotracer. CT data was obtained and used for attenuation correction and anatomic localization. FASTING BLOOD GLUCOSE:  Value: 77 mg/dl COMPARISON:  03/09/2015 FINDINGS: NECK No hypermetabolic lymph nodes in the neck. Moderate mucosal thickening involving the right maxillary sinus identified. CHEST No hypermetabolic mediastinal lymph nodes. Multi focal hypermetabolic pulmonary nodules are identified which are new from previous exam and suspicious for metastatic disease. Index right upper lobe nodule measures 11 mm and has an SUV max equal to 13.7. Index nodule in the left upper lobe measures 7 mm and has an SUV max  equal to 4.08. Index nodule in the right middle lobe measures 8 mm and has an SUV max equal to 3.14.Perihilar nodule within the left lower lobe measures 1.6 cm and has an SUV max equal to 13.8. ABDOMEN/PELVIS No abnormal hypermetabolic activity within the liver, pancreas, adrenal glands, or spleen. No hypermetabolic lymph nodes in the abdomen or pelvis. SKELETON No focal hypermetabolic activity to suggest skeletal metastasis. IMPRESSION: 1. Interval developed multi focal hypermetabolic pulmonary nodules in both lungs compatible with metastatic disease. 2. Right maxillary sinus inflammation. Electronically Signed   By: Kerby Moors M.D.   On: 10/01/2015 10:01   Dg Chest Port 1 View  10/01/2015  CLINICAL DATA:  Dyspnea EXAM: PORTABLE CHEST 1 VIEW COMPARISON:  CT scan 09/07/2015 FINDINGS: Cardiomediastinal silhouette is stable. Hyperinflation again noted. Bilateral pulmonary nodules are again noted. No acute infiltrate or pulmonary edema. There is right IJ Port-A-Cath with tip in distal SVC. Endotracheal tube in place with tip 4.8 cm above the carina. There is NG tube in place. IMPRESSION: Hyperinflation. No active disease. Endotracheal and NG tube in place. No pneumothorax. Right IJ Port-A-Cath in place. Bilateral small pulmonary nodules again noted. Electronically Signed   By: Lahoma Crocker M.D.   On: 10/01/2015 15:08   I have personally reviewed and evaluated these images and lab results as part of my medical decision-making.   EKG Interpretation None     ED ECG REPORT   Date: 10/01/2015  Rate: 133  Rhythm: sinus tachycardia  QRS Axis: normal  Intervals: normal  ST/T Wave abnormalities: nonspecific ST/T changes  Conduction Disutrbances:none  Narrative Interpretation:   Old EKG Reviewed: changes noted - non specific ST and t wave changes  I have personally reviewed the EKG tracing and agree with the computerized printout as noted.   MDM   Final diagnoses:  Anaphylactic shock, initial  encounter  Hypokalemia  Metastatic cancer ([place])    Pt comes in post arrest. Arrest whilst getting chemo, and suspected to have an anaphylactic reaction.  Pt has stage 4 supraglottic cancer. He had mild hypo K at arrival. Treating team denies any concerns for infection during  their intake prior to chemo. Pt is afebrile. He is tachycardic. PE is on the ddx, and sepsis is also possible-  But anaphylactic shock appears to be the most likely case.  Pt's BP was labile in the ER. Needed several 0.5 mg epi iv infusions and was started on an epi drip.  RN noted some seizure like activity at some point, by the time i got the room there was no shaking.  HypoK to be corrected. ICU team consulted.  Sepsis - Repeat Assessment  Performed at:    3:30 pm  Vitals     Blood pressure 129/88, pulse 84, resp. rate 21, SpO2 100 %.  Heart:     Tachycardic  Lungs:    CTA  Capillary Refill:   <2 sec  Peripheral Pulse:   Radial pulse palpable  Skin:     Normal Color    CRITICAL CARE Performed by: Varney Biles   Total critical care time: 50 minutes  Critical care time was exclusive of separately billable procedures and treating other patients.  Critical care was necessary to treat or prevent imminent or life-threatening deterioration.  Critical care was time spent personally by me on the following activities: development of treatment plan with patient and/or surrogate as well as nursing, discussions with consultants, evaluation of patient's response to treatment, examination of patient, obtaining history from patient or surrogate, ordering and performing treatments and interventions, ordering and review of laboratory studies, ordering and review of radiographic studies, pulse oximetry and re-evaluation of patient's condition.    INTUBATION Performed by: Varney Biles  Required items: required blood products, implants, devices, and special equipment available Patient identity confirmed:  provided demographic data and hospital-assigned identification number Time out: Immediately prior to procedure a "time out" was called to verify the correct patient, procedure, equipment, support staff and site/side marked as required.  Indications: airway protection  Intubation method: Direct Laryngoscopy   Preoxygenation: BVM  Sedatives: 30 mg Etomidate Paralytic: 100 mg Succinylcholine  Tube Size: 7.5 cuffed  Post-procedure assessment: chest rise and ETCO2 monitor Breath sounds: equal and absent over the epigastrium Tube secured with: ETT holder Chest x-ray interpreted by radiologist and me.  Chest x-ray findings: endotracheal tube in appropriate position  Patient tolerated the procedure well with no immediate complications.        Varney Biles, MD 10/01/15 [phone]

## 2015-10-01 NOTE — Progress Notes (Signed)
I briefly saw this patient during his appt with Dr. Alvy Bimler, prior to his first chemotherapy infusion.  I provided him with a chemotherapy calendar to better help him visualize his chemo regimen with 5-FU, Carboplatin, & Cetuximab in the coming days/weeks.  I reviewed this calendar with the patient and his aunt.    During this visit, the patient reported that he only slept 2 hours last night as he was having anxiety/panic attacks and could not sleep.  He states that the Prozac "isn't helping at all and I feel like I need something stronger."  He is also taking Ativan about once per day.  He has recently seen a provider with [facility], but is unsure if he has any follow-up appts with them or not.  Dr. Alvy Bimler and I both relayed that it generally takes 4-6 weeks for antidepressants to reach their maximum efficacy and it may be too early to increase dosage/change medications.  Also, we would like to collaborate with his psychiatry team to ensure he is receiving the care he needs for his mental health.  Therefore, I will place a call to his [facility] team to discuss their treatment plan for him and ask that they contact the patient for a follow-up appt.  If we can collaborate/consolidate his appointments during his chemo treatments, then I am happy to see the patient and prescribe necessary therapies, as needed.  Dr. Alvy Bimler is in agreement with this plan.   Patient also asked today about the use of medical marijuana in the "cure for cancer" after watching a video about it recently.   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  I discussed with Walt that there is limited scientific data to support that marijuana offers a "cure" for cancer, and further research would be needed to support this theory. Also, in the state of [place], we do not have authorization to prescribe medical marijuana,  but anecdotally patients often smoke marijuana for symptom relief of pain, nausea & vomiting, etc. However, I discouraged the use of marijuana for [place] at this time, and shared with him my rationale for this recommendation.  We discussed that we often do not know what marijuana is "laced with" and it has been my experience that some patients who smoke during chemotherapy, suffer from rare fungal lung infections when their immune systems are compromised.  He voiced verbal understanding why we do not recommend the use of marijuana during his cancer treatments.    I gave Mr. Pruitte another copy of my business card and encouraged he and his aunt to contact me with any questions or concerns. They agreed and voiced appreciation.   Mike Craze, NP [facility] [phone]  Addendum: Patient experienced Erbitux infusion reaction this afternoon and subsequently Code Blue was called.  He was evaluated by medical staff and transported to the ED for further evaluation and management.  He remains there at this time; no further updates available in the medical record at this time.

## 2015-10-01 NOTE — Progress Notes (Signed)
[facility] Progress Note Patient Name: Marvin [name] DOB: Aug 23, 1975 MRN: [id number]   Date of Service  10/01/2015  HPI/Events of Note  Pos trop - post arrest Lactate decreasing anaphylaxis  eICU Interventions  ASA 325 x1     Intervention Category Intermediate Interventions: Diagnostic test evaluation  Marvin [name] V. 10/01/2015, 8:57 PM

## 2015-10-01 NOTE — ED Notes (Signed)
Family at bedside. 

## 2015-10-01 NOTE — Patient Instructions (Signed)

## 2015-10-01 NOTE — Telephone Encounter (Signed)
per pof tos ch pt appt-pt to getupdated copy b4 leaving trmt room today

## 2015-10-01 NOTE — Progress Notes (Signed)
Patient developed anaphylactic reaction during cetuximab infusion. He was resuscitated with CPR and intubated in the infusion room and subsequently transported to the resuscitation bay in the emergency room at [facility]. I have discussed this case with the accepting ER physician and recommended high-dose steroid infusion for the next 24-48 hours Will follow tomorrow

## 2015-10-01 NOTE — ED Notes (Addendum)
Chaplain responding to Code Blue at [employer]. Provided back-up support with [place] chaplain during code event. Present in [facility] ED for support.   Three aunts, sister, Engineer, technical sales present at hospital.     [place], [place]

## 2015-10-01 NOTE — Addendum Note (Signed)
Addended by: Randolm Idol on: 10/01/2015 01:51 PM   Modules accepted: Medications

## 2015-10-01 NOTE — Assessment & Plan Note (Signed)
He is taking fentanyl patch without any breakthrough pain medicine for knee pain. I will try to taper him off the fentanyl patch and prescribed morphine sulfate as needed for breakthrough pain

## 2015-10-01 NOTE — Assessment & Plan Note (Signed)
Cause is unknown, but could be related to adrenal insufficiency. I gave him prescription potassium to take I will check his blood work again next week

## 2015-10-01 NOTE — Progress Notes (Signed)
Erbitux stopped, pt unresponsive, right arm and neck  reddnessreaction, shallow breathing, right arm and neck reddness. unable to respond CPR started, code team notified. pt intubated taken to ED

## 2015-10-01 NOTE — Progress Notes (Signed)
[place] OFFICE PROGRESS NOTE  Patient Care Team: No Pcp Per Patient as PCP - General (General Practice) [name], MD as Consulting Physician (Otolaryngology) [name], MD as Attending Physician (Radiation Oncology) [name], MD as Consulting Physician (Hematology and Oncology) Leota Sauers, RN as Oncology Nurse Navigator Lenn Cal, DDS as Consulting Physician (Dentistry)  SUMMARY OF ONCOLOGIC HISTORY: Oncology History   Carcinoma of supraglottis   Staging form: Larynx - Supraglottis, AJCC 7th Edition     Clinical stage from 03/13/2015: Stage IVA (T3, N2b, M0) - Signed by [name], MD on 03/13/2015     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[place])   01/22/2015 Imaging CT scan showed enhancing infiltrative mass centered at the right piriform sinus with bilateral neck lymphadenopathy   02/22/2015 - 03/08/2015 Hospital Admission He was admitted to the hospital for management of advanced supraglottic cancer status post tracheostomy, biopsy, dental extraction, placement of feeding tube and subsequent discharge to skilled nursing facility   02/22/2015 Pathology Results Accession: [id number] 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   03/09/2015 PET scan 1)  Right piriform sinus mass with right level 2 nodal metastasis.  2)  Hypermetabolic thoracic nodes which are at least partially felt to be reactive.  3)  No evidence of subdiaphragmatic metastatic disease.    03/16/2015 Procedure He has placement of port   03/21/2015 - 05/04/2015 Chemotherapy He received high dose cisplatin x 3 cycles   03/21/2015 - 05/09/2015 Radiation Therapy Tomotherapy to supraglottic tumor and bilateral neck:  70 Gy in 35 fractions to gross disease, 63 Gy in 35 fractions to high risk nodal echelons, and 56 Gy in 35 fractions to intermediate risk nodal echelons.   05/09/2015 Miscellaneous He went  to the ED with fall   06/20/2015 Procedure Tracheostomy was removed   09/06/2015 - 09/08/2015 Hospital Admission  he was admitted to the hospital with alcohol intoxication, dehydration and pain. CT scan of the chest show bilateral metastatic disease to the lung   10/01/2015 Imaging  PET CT scan showed diffuse, multiple hypermetabolic activity in both lungs suspicious for metastatic cancer   10/01/2015 -  Chemotherapy he received palliative chemo with 5FU/Carbo and cetuximab    INTERVAL HISTORY: Please see below for problem oriented charting. He returns for further follow-up. He complained of chronic knee pain and requested pain medication refill. He continues to have significant anxiety but denies suicidal ideation. He felt that the Prozac is not helping much. Denies any chest pain or shortness of breath. He denies drinking or smoking recently  REVIEW OF SYSTEMS:   Constitutional: Denies fevers, chills or abnormal weight loss Eyes: Denies blurriness of vision Ears, nose, mouth, throat, and face: Denies mucositis or sore throat Respiratory: Denies cough, dyspnea or wheezes Cardiovascular: Denies palpitation, chest discomfort or lower extremity swelling Gastrointestinal:  Denies nausea, heartburn or change in bowel habits Skin: Denies abnormal skin rashes Lymphatics: Denies new lymphadenopathy or easy bruising Neurological:Denies numbness, tingling or new weaknesses Behavioral/Psych: Mood is stable, no new changes  All other systems were reviewed with the patient and are negative.  I have reviewed the past medical history, past surgical history, social history and family history with the patient and they are unchanged from previous note.  ALLERGIES:  has No Known Allergies.  MEDICATIONS:  Current Outpatient Prescriptions  Medication Sig Dispense Refill  . fentaNYL (DURAGESIC - DOSED MCG/HR) 25 MCG/HR patch Place  1 patch (25 mcg total) onto the skin every 3 (three) days. 5 patch 0  .  fentaNYL (DURAGESIC - DOSED MCG/HR) 50 MCG/HR Place 1 patch (50 mcg total) onto the skin every 3 (three) days. 5 patch 0  . fludrocortisone (FLORINEF) 0.1 MG tablet Take 2 tablets (0.2 mg total) by mouth daily. 90 tablet 6  . FLUoxetine (PROZAC) 20 MG capsule Take 1 capsule (20 mg total) by mouth daily. 30 capsule 0  . LORazepam (ATIVAN) 0.5 MG tablet Take 1 tablet (0.5 mg total) by mouth 3 (three) times daily. 90 tablet 0  . morphine (MSIR) 15 MG tablet Take 1 tablet (15 mg total) by mouth every 4 (four) hours as needed for severe pain. 60 tablet 0  . potassium chloride SA (K-DUR,KLOR-CON) 20 MEQ tablet Take 1 tablet (20 mEq total) by mouth 2 (two) times daily. 14 tablet 0  . traZODone (DESYREL) 100 MG tablet Take 1 tablet (100 mg total) by mouth at bedtime as needed for sleep. 30 tablet 0   No current facility-administered medications for this visit.   Facility-Administered Medications Ordered in Other Visits  Medication Dose Route Frequency Provider Last Rate Last Dose  . 0.9 %  sodium chloride infusion   Intravenous Once [name], MD      . 0.9 %  sodium chloride infusion   Intravenous Once [name], MD      . CARBOplatin (PARAPLATIN) 670 mg in sodium chloride 0.9 % 250 mL chemo infusion  670 mg Intravenous Once [name], MD      . cetuximab (ERBITUX) chemo infusion 700 mg  400 mg/m2 (Treatment Plan Actual) Intravenous Once [name], MD      . dexamethasone (DECADRON) 10 mg in sodium chloride 0.9 % 50 mL IVPB  10 mg Intravenous Once [name], MD      . diphenhydrAMINE (BENADRYL) injection 50 mg  50 mg Intravenous Once [name], MD      . fluorouracil (ADRUCIL) 7,200 mg in sodium chloride 0.9 % 106 mL chemo infusion  1,000 mg/m2/day (Treatment Plan Actual) Intravenous 4 days [name], MD      . heparin lock flush 100 unit/mL  500 Units Intracatheter Once PRN [name], MD      . palonosetron (ALOXI) injection 0.25 mg  0.25 mg Intravenous Once [name], MD      . sodium  chloride flush (NS) 0.9 % injection 10 mL  10 mL Intravenous PRN [name], MD   10 mL at 10/01/15 0956  . sodium chloride flush (NS) 0.9 % injection 10 mL  10 mL Intracatheter PRN [name], MD        PHYSICAL EXAMINATION: ECOG PERFORMANCE STATUS: 1 - Symptomatic but completely ambulatory  Filed Vitals:   10/01/15 1010  BP: 122/90  Pulse: 72  Temp: 98.2 F (36.8 C)  Resp: 18   Filed Weights   10/01/15 1010  Weight: 139 lb 9.6 oz (63.322 kg)    GENERAL:alert, no distress and comfortable SKIN: skin color, texture, turgor are normal, no rashes or significant lesions EYES: normal, Conjunctiva are pink and non-injected, sclera clear OROPHARYNX:no exudate, no erythema and lips, buccal mucosa, and tongue normal  NECK: supple, thyroid normal size, non-tender, without nodularity. Well-healed surgical scar LYMPH:  no palpable lymphadenopathy in the cervical, axillary or inguinal LUNGS: clear to auscultation and percussion with normal breathing effort HEART: regular rate & rhythm and no murmurs and no lower extremity edema ABDOMEN:abdomen soft, non-tender and normal bowel  sounds. Well-healed surgical scar Musculoskeletal:no cyanosis of digits and no clubbing  NEURO: alert & oriented x 3 with fluent speech, no focal motor/sensory deficits  LABORATORY DATA:  I have reviewed the data as listed    Component Value Date/Time   NA 143 10/01/2015 0927   NA 138 09/15/2015 1430   NA 140 07/19/2015   K 3.0* 10/01/2015 0927   K 3.3* 09/15/2015 1430   CL 96* 09/15/2015 1430   CO2 32* 10/01/2015 0927   CO2 32 09/15/2015 1430   GLUCOSE 85 10/01/2015 0927   GLUCOSE 109* 09/15/2015 1430   BUN 6.9* 10/01/2015 0927   BUN 13 09/15/2015 1430   BUN 10 07/19/2015   CREATININE 0.7 10/01/2015 0927   CREATININE 0.67 09/15/2015 1430   CREATININE 0.65 08/16/2015 1538   CREATININE 0.6 07/19/2015   CALCIUM 8.7 10/01/2015 0927   CALCIUM 9.2 09/15/2015 1430   PROT 6.3* 10/01/2015 0927   PROT 6.8  09/12/2015 1634   ALBUMIN 3.2* 10/01/2015 0927   ALBUMIN 3.7 09/12/2015 1634   AST 18 10/01/2015 0927   AST 31 09/12/2015 1634   ALT 12 10/01/2015 0927   ALT 13* 09/12/2015 1634   ALKPHOS 120 10/01/2015 0927   ALKPHOS 133* 09/12/2015 1634   BILITOT 0.38 10/01/2015 0927   BILITOT 0.4 09/12/2015 1634   GFRNONAA >60 09/15/2015 1430   GFRAA >60 09/15/2015 1430    No results found for: SPEP, UPEP  Lab Results  Component Value Date   WBC 4.6 10/01/2015   NEUTROABS 3.1 10/01/2015   HGB 12.2* 10/01/2015   HCT 36.4* 10/01/2015   MCV 97.1 10/01/2015   PLT 226 10/01/2015      Chemistry      Component Value Date/Time   NA 143 10/01/2015 0927   NA 138 09/15/2015 1430   NA 140 07/19/2015   K 3.0* 10/01/2015 0927   K 3.3* 09/15/2015 1430   CL 96* 09/15/2015 1430   CO2 32* 10/01/2015 0927   CO2 32 09/15/2015 1430   BUN 6.9* 10/01/2015 0927   BUN 13 09/15/2015 1430   BUN 10 07/19/2015   CREATININE 0.7 10/01/2015 0927   CREATININE 0.67 09/15/2015 1430   CREATININE 0.65 08/16/2015 1538   CREATININE 0.6 07/19/2015   GLU 82 07/19/2015      Component Value Date/Time   CALCIUM 8.7 10/01/2015 0927   CALCIUM 9.2 09/15/2015 1430   ALKPHOS 120 10/01/2015 0927   ALKPHOS 133* 09/12/2015 1634   AST 18 10/01/2015 0927   AST 31 09/12/2015 1634   ALT 12 10/01/2015 0927   ALT 13* 09/12/2015 1634   BILITOT 0.38 10/01/2015 0927   BILITOT 0.4 09/12/2015 1634       RADIOGRAPHIC STUDIES:I reviewed the imaging study with the patient I have personally reviewed the radiological images as listed and agreed with the findings in the report. Nm Pet Image Restag (ps) Skull Base To Thigh  10/01/2015  CLINICAL DATA:  Supraglottic cancer.  Subsequent treatment strategy EXAM: NUCLEAR MEDICINE PET SKULL BASE TO THIGH TECHNIQUE: 7.0 mCi F-18 FDG was injected intravenously. Full-ring PET imaging was performed from the skull base to thigh after the radiotracer. CT data was obtained and used for attenuation  correction and anatomic localization. FASTING BLOOD GLUCOSE:  Value: 77 mg/dl COMPARISON:  03/09/2015 FINDINGS: NECK No hypermetabolic lymph nodes in the neck. Moderate mucosal thickening involving the right maxillary sinus identified. CHEST No hypermetabolic mediastinal lymph nodes. Multi focal hypermetabolic pulmonary nodules are identified which are new from previous  exam and suspicious for metastatic disease. Index right upper lobe nodule measures 11 mm and has an SUV max equal to 13.7. Index nodule in the left upper lobe measures 7 mm and has an SUV max equal to 4.08. Index nodule in the right middle lobe measures 8 mm and has an SUV max equal to 3.14.Perihilar nodule within the left lower lobe measures 1.6 cm and has an SUV max equal to 13.8. ABDOMEN/PELVIS No abnormal hypermetabolic activity within the liver, pancreas, adrenal glands, or spleen. No hypermetabolic lymph nodes in the abdomen or pelvis. SKELETON No focal hypermetabolic activity to suggest skeletal metastasis. IMPRESSION: 1. Interval developed multi focal hypermetabolic pulmonary nodules in both lungs compatible with metastatic disease. 2. Right maxillary sinus inflammation. Electronically Signed   By: Kerby Moors M.D.   On: 10/01/2015 10:01     ASSESSMENT & PLAN:  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[place]) The decision was made based on publication at the [facility]. It is a category 1 recommendation from NCCN. Vermorken, et al. Alison Stalling J Med [phone]  The chemotherapy consists of   1. Carboplatin (at an area under the curve of 5 mg per milliliter per minute, as a 1-hour intravenous infusion on day 1) plus  2. Fluorouracil (at a dose of 1000 mg per square meter per day for 4 days) every 3 weeks for a maximum of 6 cycles  3. Cetuximab (at a dose of 400 mg per square meter initially, as a 2-hour intravenous infusion, then 250 mg per square meter, as a 1-hour intravenous infusion per week) for a maximum of 6 cycles.   Patients with stable  disease who received chemotherapy plus cetuximab continued to receive cetuximab until disease progression or unacceptable toxic effects, whichever occurred first.  Adding cetuximab to platinum-based chemotherapy with fluorouracil (platinum-fluorouracil) significantly prolonged the median overall survival from 7.4 months in the chemotherapy-alone group to 10.1 months in the group that received chemotherapy plus cetuximab (hazard ratio for death, 0.80; 95% confidence interval, 0.64 to 0.99; P=0.04).   The addition of cetuximab prolonged the median progression-free survival time from 3.3 to 5.6 months (hazard ratio for progression, 0.54; P<0.001) and increased the response rate from 20% to 36% (P<0.001).   There were no cetuximab-related deaths. I plan to give him 2-3 cycles of chemotherapy before repeat staging PET scan I plan to see him on a weekly basis to provide supportive care  Anxiety disorder He has generalized anxiety disorder and was prescribed Prozac. No follow-up was noted in his chart. I will get my Navigator team to ensure that he had an appointment for follow-up and medication adjustment.  Major depressive disorder, recurrent episode, moderate ([place])  He felt that his depression is not under control and he is not suicidal. He will continue medication as prescribed by recent psychiatrist I will make sure he gets a follow-up appointment for medication adjustment  Chronic pain He is taking fentanyl patch without any breakthrough pain medicine for knee pain. I will try to taper him off the fentanyl patch and prescribed morphine sulfate as needed for breakthrough pain  Hypokalemia Cause is unknown, but could be related to adrenal insufficiency. I gave him prescription potassium to take I will check his blood work again next week  Orthostatic hypotension This has been improved on Florinef and adequate oral intake. Continue to same. I refill his prescription today   No orders of  the defined types were placed in this encounter.   All questions were answered. The patient knows  to call the clinic with any problems, questions or concerns. No barriers to learning was detected. I spent 30 minutes counseling the patient face to face. The total time spent in the appointment was 40 minutes and more than 50% was on counseling and review of test results     [facility], [place], MD 10/01/2015 11:27 AM

## 2015-10-02 ENCOUNTER — Inpatient Hospital Stay (HOSPITAL_COMMUNITY): Payer: Medicaid Other

## 2015-10-02 DIAGNOSIS — R0689 Other abnormalities of breathing: Secondary | ICD-10-CM

## 2015-10-02 DIAGNOSIS — E876 Hypokalemia: Secondary | ICD-10-CM

## 2015-10-02 DIAGNOSIS — T782XXA Anaphylactic shock, unspecified, initial encounter: Secondary | ICD-10-CM

## 2015-10-02 DIAGNOSIS — C321 Malignant neoplasm of supraglottis: Secondary | ICD-10-CM

## 2015-10-02 DIAGNOSIS — I469 Cardiac arrest, cause unspecified: Secondary | ICD-10-CM | POA: Insufficient documentation

## 2015-10-02 DIAGNOSIS — E872 Acidosis, unspecified: Secondary | ICD-10-CM | POA: Insufficient documentation

## 2015-10-02 DIAGNOSIS — Z8674 Personal history of sudden cardiac arrest: Secondary | ICD-10-CM

## 2015-10-02 DIAGNOSIS — I951 Orthostatic hypotension: Secondary | ICD-10-CM

## 2015-10-02 LAB — BLOOD GAS, ARTERIAL
ACID-BASE EXCESS: 0.1 mmol/L (ref 0.0–2.0)
BICARBONATE: 21.8 meq/L (ref 20.0–24.0)
Drawn by: 365271
FIO2: 0.4
LHR: 20 {breaths}/min
O2 SAT: 99 %
PATIENT TEMPERATURE: 37
PCO2 ART: 27.3 mmHg — AB (ref 35.0–45.0)
PEEP/CPAP: 5 cmH2O
PH ART: 7.514 — AB (ref 7.350–7.450)
TCO2: 19.4 mmol/L (ref 0–100)
VT: 640 mL
pO2, Arterial: 123 mmHg — ABNORMAL HIGH (ref 80.0–100.0)

## 2015-10-02 LAB — BASIC METABOLIC PANEL
ANION GAP: 11 (ref 5–15)
BUN: 12 mg/dL (ref 6–20)
CHLORIDE: 106 mmol/L (ref 101–111)
CO2: 23 mmol/L (ref 22–32)
Calcium: 8.6 mg/dL — ABNORMAL LOW (ref 8.9–10.3)
Creatinine, Ser: 0.76 mg/dL (ref 0.61–1.24)
GFR calc Af Amer: >60 mL/min (ref >60)
GFR calc non Af Amer: >60 mL/min (ref >60)
GLUCOSE: 164 mg/dL — AB (ref 65–99)
POTASSIUM: 3.3 mmol/L — AB (ref 3.5–5.1)
Sodium: 140 mmol/L (ref 135–145)

## 2015-10-02 LAB — LACTIC ACID, PLASMA: LACTIC ACID, VENOUS: 4.8 mmol/L — AB (ref 0.5–2.0)

## 2015-10-02 LAB — CBC
HEMATOCRIT: 37.1 % — AB (ref 39.0–52.0)
Hemoglobin: 12.1 g/dL — ABNORMAL LOW (ref 13.0–17.0)
MCH: 32.1 pg (ref 26.0–34.0)
MCHC: 32.6 g/dL (ref 30.0–36.0)
MCV: 98.4 fL (ref 78.0–100.0)
PLATELETS: 241 10*3/uL (ref 150–400)
RBC: 3.77 MIL/uL — AB (ref 4.22–5.81)
RDW: 14.7 % (ref 11.5–15.5)
WBC: 5.6 10*3/uL (ref 4.0–10.5)

## 2015-10-02 LAB — TROPONIN I: Troponin I: 1.13 ng/mL (ref <0.031)

## 2015-10-02 LAB — PHOSPHORUS: Phosphorus: 1.4 mg/dL — ABNORMAL LOW (ref 2.5–4.6)

## 2015-10-02 LAB — MAGNESIUM: Magnesium: 1.3 mg/dL — ABNORMAL LOW (ref 1.7–2.4)

## 2015-10-02 MED ORDER — POTASSIUM PHOSPHATES 15 MMOLE/5ML IV SOLN
40.0000 meq | Freq: Once | INTRAVENOUS | Status: AC
  Administered 2015-10-02: 40 meq via INTRAVENOUS
  Filled 2015-10-02: qty 9.09

## 2015-10-02 MED ORDER — POTASSIUM CHLORIDE 20 MEQ/15ML (10%) PO SOLN: 40.0000 meq | Freq: Once | ORAL | Status: DC

## 2015-10-02 MED ORDER — TRAZODONE HCL 50 MG PO TABS
50.0000 mg | ORAL_TABLET | Freq: Every evening | ORAL | Status: DC | PRN
  Administered 2015-10-02: 50 mg via ORAL
  Filled 2015-10-02: qty 1

## 2015-10-02 MED ORDER — MAGNESIUM SULFATE 2 GM/50ML IV SOLN
2.0000 g | Freq: Once | INTRAVENOUS | Status: AC
  Administered 2015-10-02: 2 g via INTRAVENOUS
  Filled 2015-10-02: qty 50

## 2015-10-02 MED ORDER — ANTISEPTIC ORAL RINSE SOLUTION (CORINZ)
7.0000 mL | Freq: Four times a day (QID) | OROMUCOSAL | Status: DC
  Administered 2015-10-02 – 2015-10-03 (×5): 7 mL via OROMUCOSAL

## 2015-10-02 MED ORDER — LORAZEPAM 0.5 MG PO TABS
0.5000 mg | ORAL_TABLET | Freq: Three times a day (TID) | ORAL | Status: DC | PRN
  Administered 2015-10-02: 0.5 mg via ORAL
  Filled 2015-10-02: qty 1

## 2015-10-02 MED ORDER — CHLORHEXIDINE GLUCONATE 0.12% ORAL RINSE (MEDLINE KIT)
15.0000 mL | Freq: Two times a day (BID) | OROMUCOSAL | Status: DC
  Administered 2015-10-02 – 2015-10-03 (×3): 15 mL via OROMUCOSAL

## 2015-10-02 NOTE — Progress Notes (Signed)
   10/02/15 1400  Clinical Encounter Type  Visited With Patient and family together  Visit Type Follow-up;Critical Care;Spiritual support;Social support  Referral From Other (Comment) (follow up from ED / Code)  Consult/Referral To Nurse  Stress Factors  Patient Stress Factors Health changes    Chaplain follow up for continued support with pt and family at bedside.    Introduced spiritual care as resource with [employer]  Provided follow up support with sister, Marvin [name], and aunt.   Marvin [name] was communicative and welcoming of chaplain presence.  He expressed exhaustion and uncertainty regarding plan of care moving forward.  This chaplain will continue to follow for emotional and spiritual support around admission.  Marvin [name] also has seen PhD counselor in cancer center for assessment and support.  Will report admission to support team in cancer center.    [place], [place]

## 2015-10-02 NOTE — Progress Notes (Signed)
*  PRELIMINARY RESULTS* Echocardiogram 2D Echocardiogram has been performed.  Leavy Cella 10/02/2015, 2:01 PM

## 2015-10-02 NOTE — Clinical Documentation Improvement (Signed)
Critical Care  Can the diagnosis of Respiratory Failure be further specified?   Document Inclusion Of - Hypoxia, Hypercapnia, Combination of Both  Other  Clinically Undetermined  Document any associated diagnoses/conditions.   Supporting Information: Acute Respiratory Failure - in the setting of cardiopulmonary arrest, thought related to anaphylaxis (cetuximab) per 10/01/15 progress notes.    Please exercise your independent, professional judgment when responding. A specific answer is not anticipated or expected.   Thank You,  [place] [phone]

## 2015-10-02 NOTE — Care Management Note (Signed)
Case Management Note  Patient Details  Name: Marvin [name] MRN: [id number] Date of Birth: 01-Nov-1975  Subjective/Objective:           resp failure and ventilator        Action/Plan:Date: October 02, 2015 Chart reviewed for concurrent status and case management needs. Will continue to follow patient for changes and needs: Velva Harman, BSN, RN, [place]   [phone]   Expected Discharge Date:   (UNKNOWN)               Expected Discharge Plan:  Skilled Nursing Facility  In-House Referral:  Clinical Social Work  Discharge planning Services  CM Consult  Post Acute Care Choice:  NA Choice offered to:  NA  DME Arranged:    DME Agency:     HH Arranged:    [place] Agency:     Status of Service:  In process, will continue to follow  Medicare Important Message Given:    Date Medicare IM Given:    Medicare IM give by:    Date Additional Medicare IM Given:    Additional Medicare Important Message give by:     If discussed at [place] of Stay Meetings, dates discussed:    Additional Comments:  Leeroy Cha, RN 10/02/2015, 9:40 AM

## 2015-10-02 NOTE — Progress Notes (Signed)
Marvin [name]   DOB:1975/08/23   [id number]   Oncology History   Carcinoma of supraglottis   Staging form: Larynx - Supraglottis, AJCC 7th Edition     Clinical stage from 03/13/2015: Stage IVA (T3, N2b, M0) - Signed by [name], MD on 03/13/2015     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[place])   01/22/2015 Imaging CT scan showed enhancing infiltrative mass centered at the right piriform sinus with bilateral neck lymphadenopathy   02/22/2015 - 03/08/2015 Hospital Admission He was admitted to the hospital for management of advanced supraglottic cancer status post tracheostomy, biopsy, dental extraction, placement of feeding tube and subsequent discharge to skilled nursing facility   02/22/2015 Pathology Results Accession: [id number] 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   03/09/2015 PET scan 1)  Right piriform sinus mass with right level 2 nodal metastasis.  2)  Hypermetabolic thoracic nodes which are at least partially felt to be reactive.  3)  No evidence of subdiaphragmatic metastatic disease.    03/16/2015 Procedure He has placement of port   03/21/2015 - 05/04/2015 Chemotherapy He received high dose cisplatin x 3 cycles   03/21/2015 - 05/09/2015 Radiation Therapy Tomotherapy to supraglottic tumor and bilateral neck:  70 Gy in 35 fractions to gross disease, 63 Gy in 35 fractions to high risk nodal echelons, and 56 Gy in 35 fractions to intermediate risk nodal echelons.   05/09/2015 Miscellaneous He went to the ED with fall   06/20/2015 Procedure Tracheostomy was removed   09/06/2015 - 09/08/2015 Hospital Admission  he was admitted to the hospital with alcohol intoxication, dehydration and pain. CT scan of the chest show bilateral metastatic disease to the lung   10/01/2015 Imaging  PET CT scan showed diffuse, multiple hypermetabolic activity in both lungs suspicious for metastatic cancer    10/01/2015 Adverse Reaction he received cetuximab, complicated by anaphylactic reaction   10/01/2015 [facility] Admission He was admitted to the hospital, intubated for cardiac arrest    Subjective: Patient is seen in the ICU. He was intubated and resuscitated for anaphylactic reaction. He is noted to be off pressors this morning. No history is possible  Objective:  Filed Vitals:   10/02/15 0400 10/02/15 0500  BP: 117/77 111/76  Pulse: 57 60  Temp: 99 F (37.2 C) 98.4 F (36.9 C)  Resp: 20 20     Intake/Output Summary (Last 24 hours) at 10/02/15 0824 Last data filed at 10/02/15 0500  Gross per 24 hour  Intake 2098.19 ml  Output    350 ml  Net 1748.19 ml    GENERAL:alert, no distress and comfortable. Appears intubated and sedated SKIN: skin color, texture, turgor are normal, no rashes or significant lesions EYES: Closed OROPHARYNX: ET tube in situ Musculoskeletal:no cyanosis of digits and no clubbing  NEURO: Intubated and sedated  I Labs:  Lab Results  Component Value Date   WBC 5.6 10/02/2015   HGB 12.1* 10/02/2015   HCT 37.1* 10/02/2015   MCV 98.4 10/02/2015   PLT 241 10/02/2015   NEUTROABS 3.9 10/01/2015    Lab Results  Component Value Date   NA 140 10/02/2015   K 3.3* 10/02/2015   CL 106 10/02/2015   CO2 23 10/02/2015    Studies:  Nm Pet Image Restag (ps) Skull Base To Thigh  10/01/2015  CLINICAL DATA:  Supraglottic cancer.  Subsequent treatment strategy EXAM: NUCLEAR MEDICINE PET SKULL BASE TO THIGH  TECHNIQUE: 7.0 mCi F-18 FDG was injected intravenously. Full-ring PET imaging was performed from the skull base to thigh after the radiotracer. CT data was obtained and used for attenuation correction and anatomic localization. FASTING BLOOD GLUCOSE:  Value: 77 mg/dl COMPARISON:  03/09/2015 FINDINGS: NECK No hypermetabolic lymph nodes in the neck. Moderate mucosal thickening involving the right maxillary sinus identified. CHEST No hypermetabolic mediastinal lymph  nodes. Multi focal hypermetabolic pulmonary nodules are identified which are new from previous exam and suspicious for metastatic disease. Index right upper lobe nodule measures 11 mm and has an SUV max equal to 13.7. Index nodule in the left upper lobe measures 7 mm and has an SUV max equal to 4.08. Index nodule in the right middle lobe measures 8 mm and has an SUV max equal to 3.14.Perihilar nodule within the left lower lobe measures 1.6 cm and has an SUV max equal to 13.8. ABDOMEN/PELVIS No abnormal hypermetabolic activity within the liver, pancreas, adrenal glands, or spleen. No hypermetabolic lymph nodes in the abdomen or pelvis. SKELETON No focal hypermetabolic activity to suggest skeletal metastasis. IMPRESSION: 1. Interval developed multi focal hypermetabolic pulmonary nodules in both lungs compatible with metastatic disease. 2. Right maxillary sinus inflammation. Electronically Signed   By: Kerby Moors M.D.   On: 10/01/2015 10:01   Dg Chest Port 1 View  10/02/2015  CLINICAL DATA:  Respiratory failure, laryngeal carcinoma. EXAM: PORTABLE CHEST 1 VIEW COMPARISON:  Portable chest x-ray of October 01, 2015 FINDINGS: The lungs remain hyperinflated. The interstitial markings are mildly prominent but stable. There is no alveolar infiltrate. Subtle nodularity within both lungs is again demonstrated. There is no pneumothorax or pleural effusion. The heart is normal in size. The pulmonary vascularity is not engorged. The endotracheal tube tip lies 5.2 cm above the carina. The esophagogastric tube tip projects below the inferior margin of the image. The Port-A-Cath appliance tip projects over the midportion of the SVC. IMPRESSION: COPD. Subtle pulmonary parenchymal nodules, stable. There is no evidence of pneumonia. The support tubes are in reasonable position. Electronically Signed   By: David  Martinique M.D.   On: 10/02/2015 07:26   Dg Chest Port 1 View  10/01/2015  CLINICAL DATA:  Dyspnea EXAM: PORTABLE  CHEST 1 VIEW COMPARISON:  CT scan 09/07/2015 FINDINGS: Cardiomediastinal silhouette is stable. Hyperinflation again noted. Bilateral pulmonary nodules are again noted. No acute infiltrate or pulmonary edema. There is right IJ Port-A-Cath with tip in distal SVC. Endotracheal tube in place with tip 4.8 cm above the carina. There is NG tube in place. IMPRESSION: Hyperinflation. No active disease. Endotracheal and NG tube in place. No pneumothorax. Right IJ Port-A-Cath in place. Bilateral small pulmonary nodules again noted. Electronically Signed   By: Lahoma Crocker M.D.   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 All treatment placed on hold due to cardiac arrest secondary to anaphylactic reaction to cetuximab Continue supportive care  Cardiac arrest secondary to anaphylactic reaction to cetuximab Appreciate ICU care Continue high-dose steroids Hopefully, he could be weaned off the ventilator soon  Elevated troponin Could be related to recent CPR or cardiac injury related to anaphylactic reaction Continue supportive care  Electrolyte imbalance with hypokalemia, hypomagnesemia, hypophosphatemia and hypocalcemia Replacement protocol per ICU  Chronic hypotension Possible adrenal insufficiency Orthostatic hypotension He was on Florinef in the outpatient setting and was seen by cardiologist Will defer to critical care service for further management  Discharge planning He will remain in the  hospital at least for the next few days Will follow  [facility], [place], MD 10/02/2015  8:24 AM

## 2015-10-02 NOTE — Procedures (Signed)
Extubation Procedure Note  Patient Details:   Name: Marvin [name] DOB: 1976-04-17 MRN: [id number]    Evaluation  O2 sats: stable throughout Complications: No apparent complications Patient did tolerate procedure well. Bilateral Breath Sounds: Clear, Diminished   Yes  IS = 2200 2L [place]  [place] 10/02/2015, 11:46 AM

## 2015-10-02 NOTE — Progress Notes (Signed)
Initial Nutrition Assessment  DOCUMENTATION CODES:   Severe malnutrition in context of chronic illness  INTERVENTION:  -RD to continue to monitor for needs   NUTRITION DIAGNOSIS:   Malnutrition related to chronic illness as evidenced by severe depletion of muscle mass, severe depletion of body fat.  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 He was on pressors when first admitted, is now off. No family at bedside. Pt was able to answer yes and no via head nods during visit. Pt has PEG, but has been taking in PO.   Nutrition-Focused physical exam completed. Findings are severe fat depletion, severe muscle depletion, and no edema.   Per chart, his weight is up from previous admission. It is unlikely he will need feedings via PEG while on VENT at this time.  Follow-Up for Extubation.  Labs: K 3.3, Phos 1.4, Mg 1.3, CBGs 77-142  Diet Order:  Diet NPO time specified  Skin:  Reviewed, no issues  Last BM:  2/13  Height:   Ht Readings from Last 1 Encounters:  10/01/15 6\' 1"  (1.854 m)    Weight:   Wt Readings from Last 1 Encounters:  10/01/15 144 lb 10 oz (65.6 kg)    Ideal Body Weight:  83.63 kg  BMI:  Body mass index is 19.08 kg/(m^2).  Estimated Nutritional Needs:   Kcal:   [id number]  Protein:  65-85g  Fluid:  >/= 1.95L  EDUCATION NEEDS:   No education needs identified at this time  Satira Anis. Cozette Braggs, MS, RD LDN After Hours/Weekend Pager [phone]

## 2015-10-02 NOTE — Progress Notes (Signed)
PULMONARY / CRITICAL CARE MEDICINE   Name: Marvin [name] MRN: [id number] DOB: 1976-08-11    ADMISSION DATE:  10/01/2015 CONSULTATION DATE:  10/01/15  REFERRING MD:  Dr. Kathrynn Humble   CHIEF COMPLAINT:  Cardiac Arrest     SUBJECTIVE:  Pt awake, alert, off pressors.  Writing messages on paper.  Meets extubation criteria (pulling volumes 1.6L, normal effort, rate 18)  VITAL SIGNS: BP 163/92 mmHg  Pulse 48  Temp(Src) 98.4 F (36.9 C) (Core (Comment))  Resp 14  Ht 6\' 1"  (1.854 m)  Wt 144 lb 10 oz (65.6 kg)  BMI 19.08 kg/m2  SpO2 100%  HEMODYNAMICS:    VENTILATOR SETTINGS: Vent Mode:  [-] PSV;CPAP FiO2 (%):  [40 %-100 %] 40 % Set Rate:  [20 bmp-30 bmp] 20 bmp Vt Set:  [550 mL-640 mL] 550 mL PEEP:  [5 cmH20] 5 cmH20 Pressure Support:  [5 cmH20] 5 cmH20 Plateau Pressure:  [14 cmH20-23 cmH20] 14 cmH20  INTAKE / OUTPUT: I/O last 3 completed shifts: In: 2098.2 [I.V.:208.2; NG/GT:90; IV Piggyback:1800] Out: 350 [Urine:350]  PHYSICAL EXAMINATION: General:  Thin adult male in NAD on vent  Neuro:  Awake, alert, follows commands, appears appropriate HEENT:  ETT, MM pink/moist, old trach scar  Cardiovascular:  s1s2 rrr, no m/r/g  Lungs:  Even/non-labored, vent assisted breaths, diminished bilaterally but good air movement  Abdomen:  Flat, soft, old PEG site c/d/i  Musculoskeletal:  No acute deformities, rigid extremities with flexion attempts  Skin:  Warm/dry, radiation changes noted to neck  LABS:  BMET  Recent Labs Lab 10/01/15 0927 10/01/15 1331 10/02/15 0315  NA 143 144 140  K 3.0* 2.7* 3.3*  CL  --  105 106  CO2 32* 26 23  BUN 6.9* 7 12  CREATININE 0.7 0.85 0.76  GLUCOSE 85 165* 164*    Electrolytes  Recent Labs Lab 10/01/15 0927 10/01/15 1331 10/02/15 0315  CALCIUM 8.7 8.4* 8.6*  MG 1.9 1.8 1.3*  PHOS  --  5.0* 1.4*    CBC  Recent Labs Lab 10/01/15 0926 10/01/15 1331 10/02/15 0315  WBC 4.6 6.9 5.6  HGB 12.2* 13.4 12.1*  HCT 36.4* 40.7  37.1*  PLT 226 283 241    Coag's  Recent Labs Lab 10/01/15 1331  INR 1.31    Sepsis Markers  Recent Labs Lab 10/01/15 1348 10/01/15 1800 10/01/15 2345  LATICACIDVEN 6.25* 4.7* 4.8*    ABG  Recent Labs Lab 10/01/15 1430 10/01/15 1731 10/02/15 0315  PHART 7.258* 7.464* 7.514*  PCO2ART 61.2* 31.5* 27.3*  PO2ART 534* 149* 123*    Liver Enzymes  Recent Labs Lab 10/01/15 0927 10/01/15 1331  AST 18 26  ALT 12 13*  ALKPHOS 120 109  BILITOT 0.38 0.5  ALBUMIN 3.2* 3.1*    Cardiac Enzymes  Recent Labs Lab 10/01/15 1331 10/01/15 1800 10/02/15 0059  TROPONINI 0.03 1.70* 1.13*    Glucose  Recent Labs Lab 10/01/15 0749 10/01/15 1321  GLUCAP 77 142*    Imaging Dg Chest Port 1 View  10/02/2015  CLINICAL DATA:  Respiratory failure, laryngeal carcinoma. EXAM: PORTABLE CHEST 1 VIEW COMPARISON:  Portable chest x-ray of October 01, 2015 FINDINGS: The lungs remain hyperinflated. The interstitial markings are mildly prominent but stable. There is no alveolar infiltrate. Subtle nodularity within both lungs is again demonstrated. There is no pneumothorax or pleural effusion. The heart is normal in size. The pulmonary vascularity is not engorged. The endotracheal tube tip lies 5.2 cm above the carina. The esophagogastric tube tip  projects below the inferior margin of the image. The Port-A-Cath appliance tip projects over the midportion of the SVC. IMPRESSION: COPD. Subtle pulmonary parenchymal nodules, stable. There is no evidence of pneumonia. The support tubes are in reasonable position. Electronically Signed   By: David  Martinique M.D.   On: 10/02/2015 07:26   Dg Chest Port 1 View  10/01/2015  CLINICAL DATA:  Dyspnea EXAM: PORTABLE CHEST 1 VIEW COMPARISON:  CT scan 09/07/2015 FINDINGS: Cardiomediastinal silhouette is stable. Hyperinflation again noted. Bilateral pulmonary nodules are again noted. No acute infiltrate or pulmonary edema. There is right IJ Port-A-Cath with  tip in distal SVC. Endotracheal tube in place with tip 4.8 cm above the carina. There is NG tube in place. IMPRESSION: Hyperinflation. No active disease. Endotracheal and NG tube in place. No pneumothorax. Right IJ Port-A-Cath in place. Bilateral small pulmonary nodules again noted. Electronically Signed   By: Lahoma Crocker M.D.   On: 10/01/2015 15:08     STUDIES:    CULTURES: BCx2 2/13 >>  UC 2/13 >>  ANTIBIOTICS: Cefepime 2/13 x1 Vanco 2/13 x1   SIGNIFICANT EVENTS: 2/13  Admit from cancer center after cardiac arrest with initiation of cetuximab  LINES/TUBES: ETT 2/13 >> 2/14 R [facility] 2/13 >>   DISCUSSION: 40 y/o M with PMH of ETOH abuse, depression, & Stage IV carcinoma of the supraglottis with mets to the lungs admitted 2/13 after arresting with initiation of cetuximab at the cancer center.  ROSC after 5-6 minutes (estimated) of CPR, intubated and tx to ER.    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  Former Tobacco Abuse  P:   Extubate to [place] O2 as needed Intermittent CXR  Duoneb Q6 with Q3 PRN albuterol  Scheduled solu-medrol, benadryl & pepcid for anaphylaxis  Pulmonary hygiene post extubation   CARDIOVASCULAR A:  Cardiac Arrest - in setting of anaphylactic reaction Elevated Troponin - peak 1.7, suspect secondary to cardiac arrest / CPR Chronic Hypotension - on Florinef P:  Await ECHO NS to KVO  ASA  D/C further epi gtt, off since 10pm 2/13  RENAL A:   At Risk AKI - in setting of cardiac arrest, hypotension  Lactic Acidosis - post arrest  Hypokalemia  Hypomagnesemia Hypophosphatemia P:    NS @ KVO Trend BMP / UOP  Replace electrolytes as indicated, Kphos, Mg  GASTROINTESTINAL A:   Hx Dysphagia,  PEG  At Risk Protein Calorie Malnutrition P:   NPO x4 hours then advance diet as tolerated  PPI BID in setting of suspected anaphylactic reaction   HEMATOLOGIC A:   Anemia   P:  Heparin for DVT prophylaxis  Monitor for bleeding Trend CBC  INFECTIOUS A:   No indication of acute infectious process  P:   Hold further abx Monitor fever curve / WBC  ENDOCRINE A:   Hyperglycemia with no hx of DM    P:   Monitor glucose on BMP  May need dextrose added to IVF  NEUROLOGIC A:   Acute Encephalopathy  ? Seizure Activity in ER - none noted on exam P:   RASS goal: -1  PRN versed for sedation / seizure PRN fentanyl for pain    FAMILY  - Updates:  Family updated at bedside.  Proceed with extubation   - Inter-disciplinary family meet or Palliative  Care meeting due by:  10/08/15    Noe Gens, NP-C [facility] Pgr: [phone] or if no answer [phone] 10/02/2015, 11:35 AM   PCCM Attending Note: Patient seen and examined at bedside with nurse practitioner. Please refer to her progress which I reviewed in detail. Patient status post anaphylactic reaction to chemotherapy. Did have lactic acidosis postarrest. Neurologically is intact with questionable seizure in the emergency room. Continuing when necessary Versed for potential seizure activity. Plan for extubation today. Close monitoring in intensive care unit postextubation.  I have spent a total of 31 minutes of critical care time today caring for the patient and reviewing the patient's electronic medical record.  Sonia Baller Ashok Cordia, M.D.  Pulmonary & Critical Care Pager:  [phone] After 3pm or if no response, call [phone]

## 2015-10-02 NOTE — Progress Notes (Signed)
eLink Physician-Brief Progress Note Patient Name: Marvin [name] DOB: 1976/05/13 MRN: [id number]   Date of Service  10/02/2015  HPI/Events of Note  Wants home meds for anxiety/sleep (trazodone, ativan)  eICU Interventions  Prn ativan ordered Prn trazodone ordered D/c versed     Intervention Category Minor Interventions: Routine modifications to care plan (e.g. PRN medications for pain, fever)  Simonne Maffucci 10/02/2015, 10:31 PM

## 2015-10-02 NOTE — Progress Notes (Signed)
[place] Progress Note Patient Name: Gadiel Deliman DOB: Aug 15, 1976 MRN: [id number]   Date of Service  10/02/2015  HPI/Events of Note  Resp alkalosis on vent  eICU Interventions  Plan: Decrease TV to 550 cc     Intervention Category Major Interventions: Acid-Base disturbance - evaluation and management  Maelin Kurkowski 10/02/2015, 3:59 AM

## 2015-10-03 ENCOUNTER — Telehealth: Payer: Self-pay | Admitting: Hematology and Oncology

## 2015-10-03 ENCOUNTER — Ambulatory Visit (HOSPITAL_COMMUNITY): Payer: Medicaid Other

## 2015-10-03 ENCOUNTER — Other Ambulatory Visit: Payer: Self-pay | Admitting: Hematology and Oncology

## 2015-10-03 LAB — CBC
HEMATOCRIT: 33.4 % — AB (ref 39.0–52.0)
HEMOGLOBIN: 10.8 g/dL — AB (ref 13.0–17.0)
MCH: 32 pg (ref 26.0–34.0)
MCHC: 32.3 g/dL (ref 30.0–36.0)
MCV: 99.1 fL (ref 78.0–100.0)
PLATELETS: 225 10*3/uL (ref 150–400)
RBC: 3.37 MIL/uL — AB (ref 4.22–5.81)
RDW: 15.1 % (ref 11.5–15.5)
WBC: 12.2 10*3/uL — AB (ref 4.0–10.5)

## 2015-10-03 LAB — URINE CULTURE: CULTURE: NO GROWTH

## 2015-10-03 LAB — BASIC METABOLIC PANEL
ANION GAP: 8 (ref 5–15)
BUN: 11 mg/dL (ref 6–20)
CHLORIDE: 108 mmol/L (ref 101–111)
CO2: 27 mmol/L (ref 22–32)
Calcium: 8.7 mg/dL — ABNORMAL LOW (ref 8.9–10.3)
Creatinine, Ser: 0.54 mg/dL — ABNORMAL LOW (ref 0.61–1.24)
GFR calc non Af Amer: >60 mL/min (ref >60)
Glucose, Bld: 156 mg/dL — ABNORMAL HIGH (ref 65–99)
POTASSIUM: 3.6 mmol/L (ref 3.5–5.1)
SODIUM: 143 mmol/L (ref 135–145)

## 2015-10-03 MED ORDER — HEPARIN SOD (PORK) LOCK FLUSH 100 UNIT/ML IV SOLN
500.0000 [IU] | INTRAVENOUS | Status: AC | PRN
  Administered 2015-10-03: 500 [IU]

## 2015-10-03 MED ORDER — SENNOSIDES-DOCUSATE SODIUM 8.6-50 MG PO TABS: 1.0000 | ORAL_TABLET | Freq: Every evening | ORAL | Status: DC | PRN

## 2015-10-03 NOTE — Progress Notes (Signed)
Ambulated patient in hallway on room air and patient oxygen saturation decreased to 78%. Later, ambulated patient on 2L [place] and patient's oxygen saturation maintained at 95-98%. Will continue to monitor.

## 2015-10-03 NOTE — Progress Notes (Signed)
135 mL of 2527mcg in 262mL of fentanyl wasted into sink. Witnessed by Nelva Bush, RN.

## 2015-10-03 NOTE — Discharge Summary (Signed)
Physician Discharge Summary  Patient ID: Marvin [name] MRN: [id number] DOB/AGE: 1975-10-24 40 y.o.  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  Former Tobacco Abuse Cardiac Arrest  Elevated Troponin  Chronic Hypotension  Lactic Acidosis  Hypokalemia  Hypomagnesemia  Hypophosphatemia  History of Dysphagia, Prior PEG Protein Calorie Malnutrition  Anemia  Hyperglycemia  Acute Encephalopathy                                                                         DISCHARGE PLAN BY DIAGNOSIS     Acute Hypercarbic / Hypoxic Respiratory Failure secondary to Cardiopulmonary Arrest  Anaphylactic Reaction to Cetuximab  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  Former Tobacco Abuse  Discharge Plan: Resolved respiratory failure Follow up with Pulmonary as arranged  No further Cetuximab (ANAPHYLAXIS) Oxygen continuously at 2L  Reassess at follow up for ongoing O2 needs with ambulatory desaturation assessment   Cardiac Arrest  Elevated Troponin  Chronic Hypotension   Discharge Plan: Question if home narcotic regimen is contributing to hypotension as he was not hypotensive off these medications inpatient No further cardiac follow up at this time  Resume prior home florinef  Lactic Acidosis  Hypokalemia  Hypomagnesemia  Hypophosphatemia   Discharge Plan: All issues above resolved, follow up routine labs with ONC / PCP   History of Dysphagia, Prior PEG Protein Calorie Malnutrition   Discharge Plan: Diet as tolerated   Anemia   Discharge Plan: Follow up CBC with ONC PRN  Hyperglycemia   Discharge Plan: Resolved, no acute follow up  Acute Encephalopathy   Discharge Plan: Resolved, no acute follow up                   Marvin [name] is a 40 y.o. y/o male with a PMH of ETOH abuse, depression, anxiety and Stage IV carcinoma of the supraglottis with metastasis to the lungs (dx 02/2015,s/p chemoradiation scheduled to start 5-FU/carboplatin/cetuximab regimen 2/13, followed by Dr Alvy Bimler) who was receiving his first cetuximab infusion at [facility] on 2/13 when he developed a suspected anaphylactic reaction and went into cardiac arrest requiring approximately 5-6 minutes of CPR with ROSC. He was intubated and transferred to ER.   The patient was diagnosed with carcinoma of the supraglottis in 02/2015 and underwent treatment with concurrent chemoradiation with Cisplatin in 03/2015. Initially this required tracheostomy placement for airway management however tracheostomy was removed 06/2015.  Also, status post PEG (since removed).   09/05/2015 he presented to [facility] with hypotension. 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 He was incidentally noted to have lung metastasis on CXR at that time. Follow up CT (1/20) confirmed new bilateral pulmonary nodules consistent with metastatic pulmonary disease (no LAN).  He later was admitted to [facility] with alcohol intoxication in the setting of depression / suicidal ideation.   He is followed by Dr. Alvy Bimler at the [facility] and was receiving his first round  of chemotherapy on 2/13 when he suddenly went into cardiac arrest during cetuximab infusion (known side effect). He was resuscitated and intubated in the infusion room and transported to [facility] resuscitation bay. Initial labs - Na 144, K 2.7, Cl 105, Cr 0.85, glucose 165, phos 5.0, lactic acid 6.25, troponin 0.03, WBC 6.9, Hgb 13.4 and platelets 283. CXR demonstrated hyperinflation but no active disease.   The patient was admitted to ICU and treated for  anaphylactic reaction with pepcid, solu-medrol and benadryl.  He initially required epinephrine gtt for hypotension which was quickly weaned off.  The patient remained hemodynamically stable.  AM of 2/14, he met extubation criteria and was liberated from mechanical ventilation.  ICU course complicated by electrolyte disturbances which were corrected.  The patient denied any tongue / airway swelling post extubation.  Cultures remain negative to date.  He tolerated oral intake without difficulty.  Prior to discharge, the patient was ambulated and saturations dropped to the 70's and improved on 2L.  Home oxygen arranged for 2L continuously.  The patient was medically cleared 2/15 for discharge with plans as above.     STUDIES:  ECHO 2/14 >> EF 60 - 65%, no WMA  CULTURES: BCx2 2/13 >>  UC 2/13 >>  ANTIBIOTICS: Cefepime 2/13 x1 Vanco 2/13 x1   SIGNIFICANT EVENTS: 2/13 Admit from cancer center after cardiac arrest with initiation of cetuximab 2/14 Extubated   LINES/TUBES: ETT 2/13 >> 2/14 R Chest [facility] 2/13 >>    Discharge Exam: General: Thin adult male in NAD Neuro: A&Ox4, no focal deficits, speech clear.  HEENT: MM pink/moist, old trach scar  Cardiovascular: s1s2 rrr, no m/r/g  Lungs: Good air movement, non-laboured, clear and equal bilaterally.  Abdomen: Flat, soft, old PEG site c/d/i  Musculoskeletal: No acute deformities, no edema.  Skin: Warm/dry, radiation changes noted to neck  Filed Vitals:   10/03/15 1200 10/03/15 1300 10/03/15 1514 10/03/15 1520  BP: 149/83 138/87 146/90   Pulse: 97 29 56   Temp:      TempSrc:      Resp: _0 Height:      Weight:      SpO2: 100% 86% 100% 98%     Discharge Labs  BMET  Recent Labs Lab 10/01/15 0927  10/01/15 1331 10/02/15 0315 10/03/15 0630  NA 143  --  144 140 143  K 3.0*  < > 2.7* 3.3* 3.6  CL  --   --  105 106 108  CO2 32*  --  _1 GLUCOSE 85  --  165* 164* 156*  BUN 6.9*  --  _2 CREATININE 0.7  --  0.85 0.76 0.54*  CALCIUM 8.7  --  8.4* 8.6* 8.7*  MG 1.9  --  1.8 1.3*  --   PHOS  --   --  5.0* 1.4*  --   < > = values in this interval not displayed.  CBC  Recent Labs Lab 10/01/15 1331 10/02/15 0315 10/03/15 0630  HGB 13.4 12.1* 10.8*  HCT 40.7 37.1* 33.4*  WBC 6.9 5.6 12.2*  PLT 283 241 225    Anti-Coagulation  Recent Labs Lab 10/01/15 1331  INR 1.31        Follow-up Information    Follow up with [facility], NI, MD.   Specialty:  Hematology and Oncology   Why:  As previously arranged, next planned appt 2/27 at 12:00 (subject to change)   Contact information:   [address] [address] [phone]       Follow up with Noe Gens, NP On 10/08/2015.   Specialty:  Pulmonary Disease   Why:  Hospital follow up for oxygen needs, Appt at 9:00 AM    Contact information:   [address] [place] [address] [phone]          Medication List    TAKE these medications        fentaNYL 50 MCG/HR  Commonly known as:  DURAGESIC - dosed mcg/hr  Place 1 patch (50 mcg total) onto the skin every 3 (three) days.     fentaNYL 25 MCG/HR patch  Commonly known as:  DURAGESIC - dosed mcg/hr  Place 1 patch (25 mcg total) onto the skin every 3 (three) days.     fludrocortisone 0.1 MG tablet  Commonly known as:  FLORINEF  Take 2 tablets (0.2 mg total) by mouth daily.     FLUoxetine 20 MG capsule  Commonly known as:  PROZAC  Take 1 capsule (20 mg total) by mouth daily.     LORazepam 0.5 MG tablet  Commonly known as:  ATIVAN  Take 1 tablet (0.5 mg total) by mouth 3 (three) times daily.     morphine 15 MG tablet  Commonly known as:  MSIR  Take 1 tablet (15 mg total) by mouth every 4 (four) hours as needed for severe pain.     potassium chloride SA 20 MEQ tablet  Commonly known as:  K-DUR,KLOR-CON  Take 1 tablet (20 mEq total) by mouth 2 (two) times daily.     senna-docusate 8.6-50 MG tablet  Commonly known as:   Senokot-S  Take 1 tablet by mouth at bedtime as needed for mild constipation.     traZODone 100 MG tablet  Commonly known as:  DESYREL  Take 1 tablet (100 mg total) by mouth at bedtime as needed for sleep.          Disposition:  Home. Home oxygen arranged as above.     Discharged Condition: Ercell Razon has met maximum benefit of inpatient care and is medically stable and cleared for discharge.  Patient is pending follow up as above.      Time spent on disposition:  Greater than 35 minutes.   Signed: Noe Gens, NP-C [facility] Pgr: [phone] Office: [phone]    PCCM Attending Note:  Patient seen and examined with nurse practitioner. Please refer to her discharge summary which I reviewed in detail. Status post cardiac arrest from anaphylaxis due to chemotherapeutic agent. Patient has recovered well and has normal respiratory status post extubation over 24 hours. Patient did have hypoxia with ambulation today which is likely due to some element of pulmonary contusion from CPR during resuscitation. This is also likely can be located by probable underlying COPD given the hyperinflation seen on his x-ray imaging. There is no focal opacity that would suggest an aspiration pneumonia and overall he feels well. He is scheduled for an appointment in our office next Monday and will undergo an ambulatory oxygen test at that time as well.  I have personally spent approximately 22 minutes of time today planning the patient's discharge from hospital, examining, and discussing the plan of care with nurse practitioner.  Sonia Baller Ashok Cordia, M.D. [facility] Pager:  [phone] After 3pm or if no response, call [phone]

## 2015-10-03 NOTE — Telephone Encounter (Signed)
per pof to sch pt appt-cld & left pt a message and adv of time & date of appt for 2/27

## 2015-10-03 NOTE — Progress Notes (Deleted)
PULMONARY / CRITICAL CARE MEDICINE   Name: Marvin [name] MRN: [id number] DOB: Dec 19, 1975    ADMISSION DATE:  10/01/2015 CONSULTATION DATE:  10/01/15  REFERRING MD:  Dr. Kathrynn Humble   CHIEF COMPLAINT:  Cardiac Arrest     SUBJECTIVE:   No acute events overnight.  Pt tolerating diet.    VITAL SIGNS: BP 135/82 mmHg  Pulse 53  Temp(Src) 98.4 F (36.9 C) (Core (Comment))  Resp 8  Ht 6\' 1"  (1.854 m)  Wt 146 lb 2.6 oz (66.3 kg)  BMI 19.29 kg/m2  SpO2 99%  HEMODYNAMICS:    VENTILATOR SETTINGS: Vent Mode:  [-] PSV;CPAP FiO2 (%):  [40 %] 40 % PEEP:  [5 cmH20] 5 cmH20 Pressure Support:  [5 cmH20] 5 cmH20  INTAKE / OUTPUT: I/O last 3 completed shifts: In: 1317.6 [I.V.:527.6; NG/GT:90; IV Piggyback:700] Out: 3600 [Urine:3600]  PHYSICAL EXAMINATION: General:  Thin adult male in NAD   Neuro:  Awake/alert, MAE, generalized weakness HEENT:  MM pink/moist, old trach scar  Cardiovascular:  s1s2 rrr, no m/r/g  Lungs:  Even/non-labored, vent assisted breaths, diminished bilaterally but good air movement  Abdomen:  Flat, soft, old PEG site c/d/i  Musculoskeletal:  No acute deformities, rigid extremities with flexion attempts  Skin:  Warm/dry, radiation changes noted to neck  LABS:  BMET  Recent Labs Lab 10/01/15 1331 10/02/15 0315 10/03/15 0630  NA 144 140 143  K 2.7* 3.3* 3.6  CL 105 106 108  CO2 26 23 27   BUN 7 12 11   CREATININE 0.85 0.76 0.54*  GLUCOSE 165* 164* 156*    Electrolytes  Recent Labs Lab 10/01/15 0927 10/01/15 1331 10/02/15 0315 10/03/15 0630  CALCIUM 8.7 8.4* 8.6* 8.7*  MG 1.9 1.8 1.3*  --   PHOS  --  5.0* 1.4*  --     CBC  Recent Labs Lab 10/01/15 1331 10/02/15 0315 10/03/15 0630  WBC 6.9 5.6 12.2*  HGB 13.4 12.1* 10.8*  HCT 40.7 37.1* 33.4*  PLT 283 241 225    Coag's  Recent Labs Lab 10/01/15 1331  INR 1.31    Sepsis Markers  Recent Labs Lab 10/01/15 1348 10/01/15 1800 10/01/15 2345  LATICACIDVEN 6.25* 4.7*  4.8*    ABG  Recent Labs Lab 10/01/15 1430 10/01/15 1731 10/02/15 0315  PHART 7.258* 7.464* 7.514*  PCO2ART 61.2* 31.5* 27.3*  PO2ART 534* 149* 123*    Liver Enzymes  Recent Labs Lab 10/01/15 0927 10/01/15 1331  AST 18 26  ALT 12 13*  ALKPHOS 120 109  BILITOT 0.38 0.5  ALBUMIN 3.2* 3.1*    Cardiac Enzymes  Recent Labs Lab 10/01/15 1331 10/01/15 1800 10/02/15 0059  TROPONINI 0.03 1.70* 1.13*    Glucose  Recent Labs Lab 10/01/15 0749 10/01/15 1321  GLUCAP 77 142*    Imaging No results found.   STUDIES:  ECHO 2/14 >> EF 60 - 65%, no WMA  CULTURES: BCx2 2/13 >>  UC 2/13 >>  ANTIBIOTICS: Cefepime 2/13 x1 Vanco 2/13 x1   SIGNIFICANT EVENTS: 2/13  Admit from cancer center after cardiac arrest with initiation of cetuximab 2/14  Extubated   LINES/TUBES: ETT 2/13 >> 2/14 R Chest [facility] 2/13 >>   DISCUSSION: 40 y/o M with PMH of ETOH abuse, depression, & Stage IV carcinoma of the supraglottis with mets to the lungs admitted 2/13 after arresting with initiation of cetuximab at the cancer center.  ROSC after 5-6 minutes (estimated) of CPR, intubated and tx to ER.    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  ECHO within normal limits Chronic Hypotension - on Florinef P:  NS to KVO  ASA  ? Restart Florinef prior to d/c  RENAL A:   At Risk AKI - in setting of  cardiac arrest, hypotension  Lactic Acidosis - post arrest  Hypokalemia  Hypomagnesemia Hypophosphatemia P:    NS @ KVO Trend BMP / UOP  Replace electrolytes as indicated  GASTROINTESTINAL A:   Hx Dysphagia, PEG  At Risk Protein Calorie Malnutrition P:   Advance diet as tolerated  PPI BID in setting of suspected anaphylactic reaction   HEMATOLOGIC A:   Anemia   P:  Heparin for DVT prophylaxis  Monitor for bleeding Trend CBC  INFECTIOUS A:   No indication of acute infectious process  P:   Hold further abx Monitor fever curve / WBC  ENDOCRINE A:   Hyperglycemia with no hx of DM    P:   Monitor glucose on BMP   NEUROLOGIC A:   Acute Encephalopathy  ? Seizure Activity in ER - none noted on exam P:   Continue home trazodone, PRN ativan Ambulate, PT efforts   FAMILY  - Updates:   Patient and family updated at bedside.  Ambulate, pending tolerance, may d/c home.    - Inter-disciplinary family meet or Palliative Care meeting due by:  10/08/15    Noe Gens, NP-C [facility] Pgr: [phone] or if no answer [phone] 10/03/2015, 9:12 AM

## 2015-10-03 NOTE — Discharge Instructions (Signed)
1. Review your medications carefully  2. Wear your oxygen continuously at 2L  3. Keep your follow up appointment as scheduled.  We will reassess you at that time to determine if you still need the oxygen. 4.  Return to the ER immediately if new shortness of breath, chest pain or concerning symptoms

## 2015-10-03 NOTE — Progress Notes (Signed)
Marvin [name]   DOB:Jan 11, 1976   [id number]    Subjective: the patient was extubated since yesterday. He feels well. Denies chest pain or shortness of breath Denies neurological deficit  Objective:  Filed Vitals:   10/03/15 0900 10/03/15 1000  BP: 136/83 133/83  Pulse: 60 167  Temp:    Resp: 9 17     Intake/Output Summary (Last 24 hours) at 10/03/15 1131 Last data filed at 10/03/15 1000  Gross per 24 hour  Intake    390 ml  Output   3650 ml  Net  -3260 ml    GENERAL:alert, no distress and comfortable SKIN: skin color, texture, turgor are normal, no rashes or significant lesions EYES: normal, Conjunctiva are pink and non-injected, sclera clear Musculoskeletal:no cyanosis of digits and no clubbing  NEURO: alert & oriented x 3 with fluent speech, no focal motor/sensory deficits   Labs:  Lab Results  Component Value Date   WBC 12.2* 10/03/2015   HGB 10.8* 10/03/2015   HCT 33.4* 10/03/2015   MCV 99.1 10/03/2015   PLT 225 10/03/2015   NEUTROABS 3.9 10/01/2015    Lab Results  Component Value Date   NA 143 10/03/2015   K 3.6 10/03/2015   CL 108 10/03/2015   CO2 27 10/03/2015    Studies:  Dg Chest Port 1 View  10/02/2015  CLINICAL DATA:  Respiratory failure, laryngeal carcinoma. EXAM: PORTABLE CHEST 1 VIEW COMPARISON:  Portable chest x-ray of October 01, 2015 FINDINGS: The lungs remain hyperinflated. The interstitial markings are mildly prominent but stable. There is no alveolar infiltrate. Subtle nodularity within both lungs is again demonstrated. There is no pneumothorax or pleural effusion. The heart is normal in size. The pulmonary vascularity is not engorged. The endotracheal tube tip lies 5.2 cm above the carina. The esophagogastric tube tip projects below the inferior margin of the image. The Port-A-Cath appliance tip projects over the midportion of the SVC. IMPRESSION: COPD. Subtle pulmonary parenchymal nodules, stable. There is no evidence of pneumonia.  The support tubes are in reasonable position. Electronically Signed   By: David  Martinique M.D.   On: 10/02/2015 07:26   Dg Chest Port 1 View  10/01/2015  CLINICAL DATA:  Dyspnea EXAM: PORTABLE CHEST 1 VIEW COMPARISON:  CT scan 09/07/2015 FINDINGS: Cardiomediastinal silhouette is stable. Hyperinflation again noted. Bilateral pulmonary nodules are again noted. No acute infiltrate or pulmonary edema. There is right IJ Port-A-Cath with tip in distal SVC. Endotracheal tube in place with tip 4.8 cm above the carina. There is NG tube in place. IMPRESSION: Hyperinflation. No active disease. Endotracheal and NG tube in place. No pneumothorax. Right IJ Port-A-Cath in place. Bilateral small pulmonary nodules again noted. Electronically Signed   By: Lahoma Crocker M.D.   On: 10/01/2015 15:08    Assessment & Plan:   Metastatic supraglottic cancer to the lungs All treatment placed on hold due to cardiac arrest secondary to anaphylactic reaction to cetuximab Continue supportive care Plan to resume systemic chemotherapy on 10/15/2015 cetuximab is entered as medication allergy  Cardiac arrest secondary to anaphylactic reaction to cetuximab, resolved Patient extubated Appreciate ICU care Continue high-dose steroids, hopefully can discontinue soon  Elevated troponin Could be related to recent CPR or cardiac injury related to anaphylactic reaction Continue supportive care. He is not symptomatic  Electrolyte imbalance  Replacement protocol per ICU  Chronic hypotension Possible adrenal insufficiency Orthostatic hypotension He was on Florinef in the outpatient setting and was seen by cardiologist Will defer to critical care service  for further management  Discharge planning Hopefully, he can be discharged soon. I have set up follow-up appointment on 10/15/2015  [facility], [place], MD 10/03/2015  11:31 AM

## 2015-10-04 ENCOUNTER — Telehealth: Payer: Self-pay | Admitting: General Practice

## 2015-10-06 LAB — CULTURE, BLOOD (ROUTINE X 2)
CULTURE: NO GROWTH
Culture: NO GROWTH

## 2015-10-08 ENCOUNTER — Telehealth: Payer: Self-pay | Admitting: General Practice

## 2015-10-08 ENCOUNTER — Other Ambulatory Visit: Payer: Self-pay

## 2015-10-08 ENCOUNTER — Ambulatory Visit (INDEPENDENT_AMBULATORY_CARE_PROVIDER_SITE_OTHER): Payer: Medicaid Other | Admitting: Pulmonary Disease

## 2015-10-08 ENCOUNTER — Ambulatory Visit: Payer: Self-pay

## 2015-10-08 ENCOUNTER — Encounter: Payer: Self-pay | Admitting: Pulmonary Disease

## 2015-10-08 ENCOUNTER — Ambulatory Visit: Payer: Self-pay | Admitting: Hematology and Oncology

## 2015-10-08 VITALS — BP 128/72 | HR 90 | Temp 98.1°F | Ht 73.0 in | Wt 139.0 lb

## 2015-10-08 DIAGNOSIS — J9621 Acute and chronic respiratory failure with hypoxia: Secondary | ICD-10-CM

## 2015-10-08 DIAGNOSIS — C321 Malignant neoplasm of supraglottis: Secondary | ICD-10-CM

## 2015-10-08 NOTE — Patient Instructions (Signed)
1.  Your oxygen assessment in the office was normal without oxygen.  We will discontinue the home oxygen.   2.  Push nutrition - eat and drink regular meals 3.  Please call if new needs arise - worsening shortness of breath, change in color of sputum, cough etc.  4.  Follow up with Dr. Alvy Bimler as scheduled

## 2015-10-08 NOTE — Progress Notes (Signed)
Current Outpatient Prescriptions on File Prior to Visit  Medication Sig  . fentaNYL (DURAGESIC - DOSED MCG/HR) 25 MCG/HR patch Place 1 patch (25 mcg total) onto the skin every 3 (three) days.  . fludrocortisone (FLORINEF) 0.1 MG tablet Take 2 tablets (0.2 mg total) by mouth daily.  Marland Kitchen FLUoxetine (PROZAC) 20 MG capsule Take 1 capsule (20 mg total) by mouth daily.  Marland Kitchen LORazepam (ATIVAN) 0.5 MG tablet Take 1 tablet (0.5 mg total) by mouth 3 (three) times daily.  Marland Kitchen morphine (MSIR) 15 MG tablet Take 1 tablet (15 mg total) by mouth every 4 (four) hours as needed for severe pain.  . potassium chloride SA (K-DUR,KLOR-CON) 20 MEQ tablet Take 1 tablet (20 mEq total) by mouth 2 (two) times daily.  . traZODone (DESYREL) 100 MG tablet Take 1 tablet (100 mg total) by mouth at bedtime as needed for sleep.   Current Facility-Administered Medications on File Prior to Visit  Medication  . 0.9 %  sodium chloride infusion  . sodium chloride flush (NS) 0.9 % injection 10 mL     Chief Complaint  Patient presents with  . Hospitalization Follow-up    pt c/o dry mouth, SOB with exertion, prod cough with clear mucus since being hospitalized.       Tests: 2/14  ECHO >> EF 60-65%, no WMA 2/20  Ambulatory Walk Assessment >> office assessment with 3 laps with saturations remaining > 94%  Past medical hx  has a past medical history of ETOH abuse; Alcohol dependence ([place]) (04/17/2012); COPD (chronic obstructive pulmonary disease) ([place]); Shortness of breath dyspnea; Anxiety; GERD (gastroesophageal reflux disease); Seizures ([place]); Anxiety disorder (03/12/2015); Headache (05/10/2015); Hypokalemia (05/10/2015); Anemia due to chemotherapy (05/11/2015); Cancer ([place]); Throat cancer ([place]); and Severe major depression, single episode, without psychotic features ([place]) (09/13/2015).   Past surgical hx, Allergies, Family hx, Social hx all reviewed.  Vital Signs BP 128/72 mmHg  Pulse 90  Temp(Src) 98.1 F (36.7 C) (Oral)  Ht 6'  1" (1.854 m)  Wt 139 lb (63.05 kg)  BMI 18.34 kg/m2  SpO2 96%  History of Present Illness Marvin [name] is a 40 y.o. male with PMH of ETOH abuse, depression, anxiety, and Stage IV carcinoma of the supraglottis with metastasis to the lungs (initial dx 02/2015, s/p chemoradiation scheduled to start 5-FU/carboplatin/cetuximab regimen 2/13, followed by Dr Alvy Bimler), prior trach / PEG (removed) and recent admission to [facility] from 10/01/15 to 10/03/15 after suffering a cardiac arrest while receiving first treatment of cetuximab.  He was briefly intubated and required ICU stay.  Post extubation, the patient was noted to be hypoxic with ambulation and was discharged on on O2.  He presents 2/20 for follow up of oxygen needs.    The patient reports he has only been wearing O2 as needed at home.  Presented to office on RA with saturations of 96%.  Initially wore O2 after discharge.  He reports he has periods of anxiety with dyspnea.  Gets very anxious when he thinks about further chemo and is afraid to sleep at night. Denies fevers, chills, nausea/vomiting.  Reports shortness of breath with prolonged exertion.  Denies SOB at rest.  Denies chest pain.      Physical Exam  General - No distress ENT - No sinus tenderness, no oral exudate, no LAN Cardiac - s1s2 regular, no murmur Chest - No wheeze/rales/dullness.  Able to ambulate fast paced laps in office without difficulty.  No dyspnea with talking during assessment.   Back - No focal  tenderness Abd - Soft, non-tender Ext - No edema Neuro - Normal strength Skin - No rashes Psych - soft spoken, poor eye contact   Assessment/Plan  Hypoxic Respiratory Failure - post arrest in setting of metastatic lung disease, resolved.  Ambulatory assessment with  Stage IV Carcinoma of the Supraglottis with Metastasis to the Lung   Plan: Ambulatory assessment with sats >94% on room air D/C home oxygen  Encouraged pt to push nutrition, eat / drink regular meals   Follow up PRN if new needs arise Follow up with Dr. Alvy Bimler as scheduled    Patient Instructions  1.  Your oxygen assessment in the office was normal without oxygen.  We will discontinue the home oxygen.   2.  Push nutrition - eat and drink regular meals 3.  Please call if new needs arise - worsening shortness of breath, change in color of sputum, cough etc.  4.  Follow up with Dr. Alvy Bimler as scheduled      Noe Gens, NP-C [place]  [phone] 10/08/2015, 10:10 AM

## 2015-10-15 ENCOUNTER — Ambulatory Visit: Payer: Self-pay

## 2015-10-15 ENCOUNTER — Ambulatory Visit (HOSPITAL_BASED_OUTPATIENT_CLINIC_OR_DEPARTMENT_OTHER): Payer: Medicaid Other

## 2015-10-15 ENCOUNTER — Encounter: Payer: Self-pay | Admitting: Hematology and Oncology

## 2015-10-15 ENCOUNTER — Telehealth: Payer: Self-pay | Admitting: Adult Health

## 2015-10-15 ENCOUNTER — Telehealth: Payer: Self-pay | Admitting: Hematology and Oncology

## 2015-10-15 ENCOUNTER — Other Ambulatory Visit (HOSPITAL_BASED_OUTPATIENT_CLINIC_OR_DEPARTMENT_OTHER): Payer: Medicaid Other

## 2015-10-15 ENCOUNTER — Encounter: Payer: Self-pay | Admitting: Adult Health

## 2015-10-15 ENCOUNTER — Ambulatory Visit (HOSPITAL_BASED_OUTPATIENT_CLINIC_OR_DEPARTMENT_OTHER): Payer: Medicaid Other | Admitting: Hematology and Oncology

## 2015-10-15 ENCOUNTER — Encounter: Payer: Self-pay | Admitting: *Deleted

## 2015-10-15 VITALS — BP 124/81 | HR 76 | Temp 98.2°F | Resp 18 | Ht 73.0 in | Wt 137.1 lb

## 2015-10-15 DIAGNOSIS — F331 Major depressive disorder, recurrent, moderate: Secondary | ICD-10-CM

## 2015-10-15 DIAGNOSIS — C321 Malignant neoplasm of supraglottis: Secondary | ICD-10-CM | POA: Diagnosis not present

## 2015-10-15 DIAGNOSIS — R0789 Other chest pain: Secondary | ICD-10-CM

## 2015-10-15 DIAGNOSIS — Z5111 Encounter for antineoplastic chemotherapy: Secondary | ICD-10-CM | POA: Diagnosis present

## 2015-10-15 DIAGNOSIS — D638 Anemia in other chronic diseases classified elsewhere: Secondary | ICD-10-CM | POA: Diagnosis not present

## 2015-10-15 DIAGNOSIS — E876 Hypokalemia: Secondary | ICD-10-CM

## 2015-10-15 DIAGNOSIS — C7801 Secondary malignant neoplasm of right lung: Secondary | ICD-10-CM | POA: Diagnosis not present

## 2015-10-15 DIAGNOSIS — C7802 Secondary malignant neoplasm of left lung: Secondary | ICD-10-CM

## 2015-10-15 DIAGNOSIS — Z452 Encounter for adjustment and management of vascular access device: Secondary | ICD-10-CM

## 2015-10-15 DIAGNOSIS — E43 Unspecified severe protein-calorie malnutrition: Secondary | ICD-10-CM

## 2015-10-15 DIAGNOSIS — C7800 Secondary malignant neoplasm of unspecified lung: Secondary | ICD-10-CM

## 2015-10-15 DIAGNOSIS — F411 Generalized anxiety disorder: Secondary | ICD-10-CM

## 2015-10-15 LAB — CBC WITH DIFFERENTIAL/PLATELET
BASO%: 0.5 % (ref 0.0–2.0)
Basophils Absolute: 0 10*3/uL (ref 0.0–0.1)
EOS%: 1.9 % (ref 0.0–7.0)
Eosinophils Absolute: 0.1 10*3/uL (ref 0.0–0.5)
HCT: 38.5 % (ref 38.4–49.9)
HEMOGLOBIN: 12.7 g/dL — AB (ref 13.0–17.1)
LYMPH#: 0.7 10*3/uL — AB (ref 0.9–3.3)
LYMPH%: 14.1 % (ref 14.0–49.0)
MCH: 31.7 pg (ref 27.2–33.4)
MCHC: 33 g/dL (ref 32.0–36.0)
MCV: 96.1 fL (ref 79.3–98.0)
MONO#: 0.5 10*3/uL (ref 0.1–0.9)
MONO%: 10.5 % (ref 0.0–14.0)
NEUT%: 73 % (ref 39.0–75.0)
NEUTROS ABS: 3.6 10*3/uL (ref 1.5–6.5)
Platelets: 202 10*3/uL (ref 140–400)
RBC: 4 10*6/uL — ABNORMAL LOW (ref 4.20–5.82)
RDW: 15.4 % — AB (ref 11.0–14.6)
WBC: 4.9 10*3/uL (ref 4.0–10.3)

## 2015-10-15 LAB — COMPREHENSIVE METABOLIC PANEL
ALBUMIN: 3.4 g/dL — AB (ref 3.5–5.0)
ALK PHOS: 103 U/L (ref 40–150)
ALT: 10 U/L (ref 0–55)
AST: 16 U/L (ref 5–34)
Anion Gap: 9 mEq/L (ref 3–11)
BILIRUBIN TOTAL: 0.5 mg/dL (ref 0.20–1.20)
BUN: 12.1 mg/dL (ref 7.0–26.0)
CO2: 29 mEq/L (ref 22–29)
CREATININE: 0.7 mg/dL (ref 0.7–1.3)
Calcium: 9 mg/dL (ref 8.4–10.4)
Chloride: 105 mEq/L (ref 98–109)
EGFR: >90 mL/min/{1.73_m2} (ref >90)
GLUCOSE: 84 mg/dL (ref 70–140)
POTASSIUM: 3.3 meq/L — AB (ref 3.5–5.1)
SODIUM: 143 meq/L (ref 136–145)
TOTAL PROTEIN: 6.4 g/dL (ref 6.4–8.3)

## 2015-10-15 LAB — MAGNESIUM: Magnesium: 1.8 mg/dl (ref 1.5–2.5)

## 2015-10-15 MED ORDER — SODIUM CHLORIDE 0.9 % IV SOLN
1000.0000 mg/m2/d | INTRAVENOUS | Status: DC
  Administered 2015-10-15: 7200 mg via INTRAVENOUS
  Filled 2015-10-15: qty 144

## 2015-10-15 MED ORDER — MORPHINE SULFATE 30 MG PO TABS: 30.0000 mg | ORAL_TABLET | ORAL | Status: DC | PRN

## 2015-10-15 MED ORDER — SODIUM CHLORIDE 0.9 % IV SOLN
Freq: Once | INTRAVENOUS | Status: AC
  Administered 2015-10-15: 12:00:00 via INTRAVENOUS

## 2015-10-15 MED ORDER — SODIUM CHLORIDE 0.9 % IV SOLN
10.0000 mg | Freq: Once | INTRAVENOUS | Status: AC
  Administered 2015-10-15: 10 mg via INTRAVENOUS
  Filled 2015-10-15: qty 1

## 2015-10-15 MED ORDER — FLUOXETINE HCL 20 MG PO CAPS: 20.0000 mg | ORAL_CAPSULE | Freq: Every day | ORAL | Status: DC

## 2015-10-15 MED ORDER — SODIUM CHLORIDE 0.9% FLUSH
10.0000 mL | INTRAVENOUS | Status: DC | PRN
  Administered 2015-10-15: 10 mL via INTRAVENOUS
  Filled 2015-10-15: qty 10

## 2015-10-15 MED ORDER — PALONOSETRON HCL INJECTION 0.25 MG/5ML
INTRAVENOUS | Status: AC
  Filled 2015-10-15: qty 5

## 2015-10-15 MED ORDER — HYDROMORPHONE HCL 1 MG/ML IJ SOLN
2.0000 mg | INTRAMUSCULAR | Status: DC | PRN
Start: 2015-10-15 — End: 2015-10-15
  Administered 2015-10-15: 2 mg via INTRAVENOUS
  Filled 2015-10-15: qty 2

## 2015-10-15 MED ORDER — HYDROMORPHONE HCL 4 MG/ML IJ SOLN
INTRAMUSCULAR | Status: AC
  Filled 2015-10-15: qty 1

## 2015-10-15 MED ORDER — SODIUM CHLORIDE 0.9 % IV SOLN
668.0000 mg | Freq: Once | INTRAVENOUS | Status: AC
  Administered 2015-10-15: 670 mg via INTRAVENOUS
  Filled 2015-10-15: qty 67

## 2015-10-15 MED ORDER — SENNA 8.6 MG PO TABS: 1.0000 | ORAL_TABLET | Freq: Two times a day (BID) | ORAL | Status: AC

## 2015-10-15 MED ORDER — MORPHINE SULFATE ER 30 MG PO TBCR: 30.0000 mg | EXTENDED_RELEASE_TABLET | Freq: Two times a day (BID) | ORAL | Status: DC

## 2015-10-15 MED ORDER — PALONOSETRON HCL INJECTION 0.25 MG/5ML
0.2500 mg | Freq: Once | INTRAVENOUS | Status: AC
  Administered 2015-10-15: 0.25 mg via INTRAVENOUS

## 2015-10-15 NOTE — Progress Notes (Signed)
[place] OFFICE PROGRESS NOTE  Patient Care Team: No Pcp Per Patient as PCP - General (General Practice) [name], MD as Consulting Physician (Otolaryngology) [name], MD as Attending Physician (Radiation Oncology) [name], MD as Consulting Physician (Hematology and Oncology) Leota Sauers, RN as Oncology Nurse Navigator Lenn Cal, DDS as Consulting Physician (Dentistry)  SUMMARY OF ONCOLOGIC HISTORY: Oncology History   Carcinoma of supraglottis   Staging form: Larynx - Supraglottis, AJCC 7th Edition     Clinical stage from 03/13/2015: Stage IVA (T3, N2b, M0) - Signed by [name], MD on 03/13/2015     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[place])   01/22/2015 Imaging CT scan showed enhancing infiltrative mass centered at the right piriform sinus with bilateral neck lymphadenopathy   02/22/2015 - 03/08/2015 Hospital Admission He was admitted to the hospital for management of advanced supraglottic cancer status post tracheostomy, biopsy, dental extraction, placement of feeding tube and subsequent discharge to skilled nursing facility   02/22/2015 Pathology Results Accession: [id number] 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   03/09/2015 PET scan 1)  Right piriform sinus mass with right level 2 nodal metastasis.  2)  Hypermetabolic thoracic nodes which are at least partially felt to be reactive.  3)  No evidence of subdiaphragmatic metastatic disease.    03/16/2015 Procedure He has placement of port   03/21/2015 - 05/04/2015 Chemotherapy He received high dose cisplatin x 3 cycles   03/21/2015 - 05/09/2015 Radiation Therapy Tomotherapy to supraglottic tumor and bilateral neck:  70 Gy in 35 fractions to gross disease, 63 Gy in 35 fractions to high risk nodal echelons, and 56 Gy in 35 fractions to intermediate risk nodal echelons.   05/09/2015 Miscellaneous He went  to the ED with fall   06/20/2015 Procedure Tracheostomy was removed   09/06/2015 - 09/08/2015 Hospital Admission  he was admitted to the hospital with alcohol intoxication, dehydration and pain. CT scan of the chest show bilateral metastatic disease to the lung   10/01/2015 Imaging  PET CT scan showed diffuse, multiple hypermetabolic activity in both lungs suspicious for metastatic cancer   10/01/2015 Adverse Reaction he received cetuximab, complicated by anaphylactic reaction   10/01/2015 - 10/03/2015 Hospital Admission He was admitted to the hospital, intubated for cardiac arrest   10/15/2015 -  Chemotherapy He began palliative chemo with carboplatin and 5FU    INTERVAL HISTORY: Please see below for problem oriented charting. He returns for further follow-up. He complain of chest wall pain from recent CPR. He rated his pain as 4-5 out of 10 pain. He wants to try something stronger for pain medicine. He also continues to have anxiety but denies severe depression or suicidal ideation. He has no follow-up appointment with the psychiatrist yet  REVIEW OF SYSTEMS:   Constitutional: Denies fevers, chills or abnormal weight loss Eyes: Denies blurriness of vision Ears, nose, mouth, throat, and face: Denies mucositis or sore throat Respiratory: Denies cough, dyspnea or wheezes Cardiovascular: Denies palpitation, chest discomfort or lower extremity swelling Gastrointestinal:  Denies nausea, heartburn or change in bowel habits Skin: Denies abnormal skin rashes Lymphatics: Denies new lymphadenopathy or easy bruising Neurological:Denies numbness, tingling or new weaknesses Behavioral/Psych: Mood is stable, no new changes  All other systems were reviewed with the patient and are negative.  I have reviewed the past medical history, past surgical history, social history and family history with the patient and  they are unchanged from previous note.  ALLERGIES:  is allergic to cetuximab.  MEDICATIONS:   Current Outpatient Prescriptions  Medication Sig Dispense Refill  . fludrocortisone (FLORINEF) 0.1 MG tablet Take 2 tablets (0.2 mg total) by mouth daily. 90 tablet 6  . FLUoxetine (PROZAC) 20 MG capsule Take 1 capsule (20 mg total) by mouth daily. 30 capsule 0  . LORazepam (ATIVAN) 0.5 MG tablet Take 1 tablet (0.5 mg total) by mouth 3 (three) times daily. 90 tablet 0  . morphine (MSIR) 15 MG tablet Take 1 tablet (15 mg total) by mouth every 4 (four) hours as needed for severe pain. 60 tablet 0  . potassium chloride SA (K-DUR,KLOR-CON) 20 MEQ tablet Take 1 tablet (20 mEq total) by mouth 2 (two) times daily. 14 tablet 0  . traZODone (DESYREL) 100 MG tablet Take 1 tablet (100 mg total) by mouth at bedtime as needed for sleep. 30 tablet 0  . morphine (MS CONTIN) 30 MG 12 hr tablet Take 1 tablet (30 mg total) by mouth every 12 (twelve) hours. 30 tablet 0  . morphine (MSIR) 30 MG tablet Take 1 tablet (30 mg total) by mouth every 4 (four) hours as needed. 60 tablet 0  . senna (SENOKOT) 8.6 MG TABS tablet Take 1 tablet (8.6 mg total) by mouth 2 (two) times daily. 60 each 0   No current facility-administered medications for this visit.   Facility-Administered Medications Ordered in Other Visits  Medication Dose Route Frequency Provider Last Rate Last Dose  . 0.9 %  sodium chloride infusion   Intravenous Once [name], MD      . fluorouracil (ADRUCIL) 7,200 mg in sodium chloride 0.9 % 106 mL chemo infusion  1,000 mg/m2/day (Treatment Plan Actual) Intravenous 4 days [name], MD   7,200 mg at 10/15/15 1444  . HYDROmorphone (DILAUDID) injection 2 mg  2 mg Intravenous Q2H PRN [name], MD   2 mg at 10/15/15 1242  . sodium chloride flush (NS) 0.9 % injection 10 mL  10 mL Intravenous PRN [name], MD   10 mL at 10/01/15 0956    PHYSICAL EXAMINATION: ECOG PERFORMANCE STATUS: 1 - Symptomatic but completely ambulatory  Filed Vitals:   10/15/15 1104  BP: 124/81  Pulse: 76  Temp: 98.2 F (36.8  C)  Resp: 18   Filed Weights   10/15/15 1104  Weight: 137 lb 1.6 oz (62.188 kg)    GENERAL:alert, no distress and comfortable SKIN: skin color, texture, turgor are normal, no rashes or significant lesions EYES: normal, Conjunctiva are pink and non-injected, sclera clear OROPHARYNX:no exudate, no erythema and lips, buccal mucosa, and tongue normal  NECK: supple, thyroid normal size, non-tender, without nodularity LYMPH:  no palpable lymphadenopathy in the cervical, axillary or inguinal LUNGS: clear to auscultation and percussion with normal breathing effort HEART: regular rate & rhythm and no murmurs and no lower extremity edema ABDOMEN:abdomen soft, non-tender and normal bowel sounds Musculoskeletal:no cyanosis of digits and no clubbing  NEURO: alert & oriented x 3 with fluent speech, no focal motor/sensory deficits  LABORATORY DATA:  I have reviewed the data as listed    Component Value Date/Time   NA 143 10/15/2015 1048   NA 143 10/03/2015 0630   NA 140 07/19/2015   K 3.3* 10/15/2015 1048   K 3.6 10/03/2015 0630   CL 108 10/03/2015 0630   CO2 29 10/15/2015 1048   CO2 27 10/03/2015 0630   GLUCOSE 84 10/15/2015 1048   GLUCOSE 156* 10/03/2015  0630   BUN 12.1 10/15/2015 1048   BUN 11 10/03/2015 0630   BUN 10 07/19/2015   CREATININE 0.7 10/15/2015 1048   CREATININE 0.54* 10/03/2015 0630   CREATININE 0.65 08/16/2015 1538   CREATININE 0.6 07/19/2015   CALCIUM 9.0 10/15/2015 1048   CALCIUM 8.7* 10/03/2015 0630   PROT 6.4 10/15/2015 1048   PROT 5.7* 10/01/2015 1331   ALBUMIN 3.4* 10/15/2015 1048   ALBUMIN 3.1* 10/01/2015 1331   AST 16 10/15/2015 1048   AST 26 10/01/2015 1331   ALT 10 10/15/2015 1048   ALT 13* 10/01/2015 1331   ALKPHOS 103 10/15/2015 1048   ALKPHOS 109 10/01/2015 1331   BILITOT 0.50 10/15/2015 1048   BILITOT 0.5 10/01/2015 1331   GFRNONAA >60 10/03/2015 0630   GFRAA >60 10/03/2015 0630    No results found for: SPEP, UPEP  Lab Results  Component  Value Date   WBC 4.9 10/15/2015   NEUTROABS 3.6 10/15/2015   HGB 12.7* 10/15/2015   HCT 38.5 10/15/2015   MCV 96.1 10/15/2015   PLT 202 10/15/2015      Chemistry      Component Value Date/Time   NA 143 10/15/2015 1048   NA 143 10/03/2015 0630   NA 140 07/19/2015   K 3.3* 10/15/2015 1048   K 3.6 10/03/2015 0630   CL 108 10/03/2015 0630   CO2 29 10/15/2015 1048   CO2 27 10/03/2015 0630   BUN 12.1 10/15/2015 1048   BUN 11 10/03/2015 0630   BUN 10 07/19/2015   CREATININE 0.7 10/15/2015 1048   CREATININE 0.54* 10/03/2015 0630   CREATININE 0.65 08/16/2015 1538   CREATININE 0.6 07/19/2015   GLU 82 07/19/2015      Component Value Date/Time   CALCIUM 9.0 10/15/2015 1048   CALCIUM 8.7* 10/03/2015 0630   ALKPHOS 103 10/15/2015 1048   ALKPHOS 109 10/01/2015 1331   AST 16 10/15/2015 1048   AST 26 10/01/2015 1331   ALT 10 10/15/2015 1048   ALT 13* 10/01/2015 1331   BILITOT 0.50 10/15/2015 1048   BILITOT 0.5 10/01/2015 1331       ASSESSMENT & PLAN:  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[place]) He developed severe life-threatening anaphylactic reaction recently. I have entered cetuximab into his allergy profile. We will proceed with the rest of the chemotherapy with carboplatin and 5-FU. I will see him every 2 weeks for supportive care.  Chest wall pain He has moderate chest wall pain related to recent resuscitation effort with chest compression. He felt that the fentanyl is not helping him, which he suspects is related to lack of body fat for absorption given his thin body habitus. I recommend a trial of combination of MS Contin and IR morphine for pain control. We discussed narcotic refill policy. I will see him back in 2 weeks to evaluate pain control  Anemia of chronic illness This is likely anemia of chronic disease. The patient denies recent history of bleeding such as epistaxis, hematuria or hematochezia. He is asymptomatic from the anemia. We will observe for now.  He does not  require transfusion now.    Major depressive disorder, recurrent episode, moderate ([place])  He felt that his depression is not under control but he is not suicidal. He will continue medication as prescribed by recent psychiatrist I will make sure he gets a follow-up appointment for medication adjustment     No orders of the defined types were placed in this encounter.   All questions were answered. The patient knows  to call the clinic with any problems, questions or concerns. No barriers to learning was detected. I spent 20 minutes counseling the patient face to face. The total time spent in the appointment was 25 minutes and more than 50% was on counseling and review of test results     [facility], [place], MD 10/15/2015 4:02 PM

## 2015-10-15 NOTE — Assessment & Plan Note (Signed)
He has moderate chest wall pain related to recent resuscitation effort with chest compression. He felt that the fentanyl is not helping him, which he suspects is related to lack of body fat for absorption given his thin body habitus. I recommend a trial of combination of MS Contin and IR morphine for pain control. We discussed narcotic refill policy. I will see him back in 2 weeks to evaluate pain control

## 2015-10-15 NOTE — Patient Instructions (Signed)

## 2015-10-15 NOTE — Assessment & Plan Note (Signed)
He developed severe life-threatening anaphylactic reaction recently. I have entered cetuximab into his allergy profile. We will proceed with the rest of the chemotherapy with carboplatin and 5-FU. I will see him every 2 weeks for supportive care.

## 2015-10-15 NOTE — Telephone Encounter (Signed)
Received a call from [name], [name] aunt, with questions regarding Marvin [name]'s Prozac prescription.  I let her know that according to Dr. Calton Dach note, she refilled that medication today.  I let Thayer Headings know that if there were problems when they got to the pharmacy to call me back and I could re-order it for them.  I encouraged them to call me with any other questions or concerns as well.   Note: His pharmacy is the [facility] on [address].    Mike Craze, NP [place] [phone]

## 2015-10-15 NOTE — Telephone Encounter (Signed)
Scheduled patient appt per pof, avs report printed.  ° °Staff message sent to Michelle  °

## 2015-10-15 NOTE — Assessment & Plan Note (Signed)
He felt that his depression is not under control but he is not suicidal. He will continue medication as prescribed by recent psychiatrist I will make sure he gets a follow-up appointment for medication adjustment

## 2015-10-15 NOTE — Patient Instructions (Signed)
[facility] Discharge Instructions for Patients Receiving Chemotherapy  Today you received the following chemotherapy agents Carboplatin and Adrucil.  To help prevent nausea and vomiting after your treatment, we encourage you to take your nausea medication as prescribed.   If you develop nausea and vomiting that is not controlled by your nausea medication, call the clinic.   BELOW ARE SYMPTOMS THAT SHOULD BE REPORTED IMMEDIATELY:  *FEVER GREATER THAN 100.5 F  *CHILLS WITH OR WITHOUT FEVER  NAUSEA AND VOMITING THAT IS NOT CONTROLLED WITH YOUR NAUSEA MEDICATION  *UNUSUAL SHORTNESS OF BREATH  *UNUSUAL BRUISING OR BLEEDING  TENDERNESS IN MOUTH AND THROAT WITH OR WITHOUT PRESENCE OF ULCERS  *URINARY PROBLEMS  *BOWEL PROBLEMS  UNUSUAL RASH Items with * indicate a potential emergency and should be followed up as soon as possible.  Feel free to call the clinic you have any questions or concerns. The clinic phone number is [phone].  Please show the [facility] at check-in to the Emergency Department and triage nurse.

## 2015-10-15 NOTE — Progress Notes (Signed)
I briefly saw [name] today in infusion while he was preparing to receive chemotherapy.  He tells me that he is doing well and feeling much better since being discharged from the hospital. He endorses continued sternal chest pain, after undergoing CPR a few weeks ago after anaphylactic reaction to Erbitux sent him to the ER and subsequently the intensive care unit.  He reports that his aunt is with him today, but she had stepped away at the time I saw him in infusion.  I encouraged him to call me with any questions or concerns.  He voiced understanding.  I will follow-up with Dr. Alvy Bimler re: this patient to see if there is anything I can do to continue to support his care.    Marvin Craze, NP [place] [phone]

## 2015-10-15 NOTE — Assessment & Plan Note (Signed)
This is likely anemia of chronic disease. The patient denies recent history of bleeding such as epistaxis, hematuria or hematochezia. He is asymptomatic from the anemia. We will observe for now.  He does not require transfusion now.   

## 2015-10-17 ENCOUNTER — Telehealth: Payer: Self-pay | Admitting: *Deleted

## 2015-10-17 NOTE — Telephone Encounter (Signed)
Per staff message and POF I have scheduled appts. Advised scheduler of appts. JMW  

## 2015-10-18 ENCOUNTER — Telehealth: Payer: Self-pay | Admitting: General Practice

## 2015-10-18 ENCOUNTER — Encounter: Payer: Self-pay | Admitting: Adult Health

## 2015-10-18 ENCOUNTER — Telehealth: Payer: Self-pay | Admitting: Adult Health

## 2015-10-18 NOTE — Progress Notes (Signed)
I have mailed a copy of his behavioral health follow-up recommendations with contact information for identified clinics to his home.  I have also left a copy of this info with the flush nurse to be given to Mr. Bouley tomorrow, 10/19/15 for his chemo disconnect/flush appointment.  I encouraged Mr. Vangorder to call me with any questions or concerns.   Mike Craze, NP [place] [phone]

## 2015-10-18 NOTE — Telephone Encounter (Signed)
Followed up with client about how he was feeling, and scheduled a counseling appointment for next Monday before his chemo treatment.

## 2015-10-18 NOTE — Telephone Encounter (Signed)
I called Mr. Horman to ask him if he knew the name of the psychiatrist he saw after his recent behavioral health hospitalization.  He tells me that he cannot remember the doctor's name.  I asked that he look at the last bottle of his Prozac and tell me who the prescriber was (previously to Dr. Alvy Bimler recently refilling this medication).  He was able to retrieve his old Prozac bottle and the previous prescriber of his Prozac was Dr. Hildred Priest, MD, his physician while he was under inpatient psychiatric care at [facility].  Mr. Ebanks tells me that he is physically feeling well and I let him know that I was trying to contact his psychiatric team on his behalf to get him established follow-up care for medication adjustment.    I have contacted [facility] [phone]) to try to get Mr. Hirschman in to see a provider at this outpatient location.  I had to leave a message requesting a return call from this practice.    I also placed a call to [facility] [phone]) to try to speak to someone directly.  I was able to speak with Candace, as she was the patient's social worker when he was inpatient.  Candace let me know that because the patient has a certain type of Medicaid, he can only be seen at 2 different clinics in [place], both of which are walk-in clinics. (he would not be eligible to be seen at [facility] at the location I called above).  Below is the information shared with me by Candace.   [facility]:  [facility] [address], [address] [place], [place] [address] *They do not make appointments for the patient's 1st visit, as many patients do not show up for their first appointment.   *Mr. Santmyer should present to the clinic between the hours of 8 am - 5 pm, Monday thru Friday to be seen, evaluated.  *They do prescribe medications at this  location.   [facility]: [id number], [place] [address] *It is also a walk-in clinic and he should arrive between 8:30 am - 10 am, Monday thru Friday in order to be seen that day. *They provide medications at this location also.    Both locations are located in downtown [place].     I have shared this information with Mr. Porcher via phone and will mail him these locations and instructions.  I have forwarded this message to Dr. Alvy Bimler and [name], RN-H&N Navigator so they are aware and continue to encourage Mr. Linsey to seek additional support with psychiatry.    Mike Craze, NP [place] [phone]

## 2015-10-18 NOTE — Telephone Encounter (Signed)
Counselor contacted client about rescheduling client's missed counseling appointment last week. Client stated he was unsure when he could come in due to transportation, so counselor stated she would follow up with him tomorrow morning.

## 2015-10-18 NOTE — Telephone Encounter (Signed)
Thanks Gretchen!

## 2015-10-18 NOTE — Telephone Encounter (Signed)
Followed up with client about his reaction to the chemo treatment/hospitalization. Client reported doing ok, but stated would be unable to come to the counseling appointment tomorrow. Counselor will follow up with client next week.

## 2015-10-19 ENCOUNTER — Ambulatory Visit (HOSPITAL_BASED_OUTPATIENT_CLINIC_OR_DEPARTMENT_OTHER): Payer: Medicaid Other

## 2015-10-19 VITALS — BP 118/71 | HR 73 | Temp 98.9°F

## 2015-10-19 DIAGNOSIS — Z452 Encounter for adjustment and management of vascular access device: Secondary | ICD-10-CM

## 2015-10-19 DIAGNOSIS — C321 Malignant neoplasm of supraglottis: Secondary | ICD-10-CM | POA: Diagnosis not present

## 2015-10-19 MED ORDER — HEPARIN SOD (PORK) LOCK FLUSH 100 UNIT/ML IV SOLN
500.0000 [IU] | Freq: Once | INTRAVENOUS | Status: AC | PRN
  Administered 2015-10-19: 500 [IU]
  Filled 2015-10-19: qty 5

## 2015-10-19 MED ORDER — SODIUM CHLORIDE 0.9% FLUSH
10.0000 mL | INTRAVENOUS | Status: DC | PRN
  Administered 2015-10-19: 10 mL
  Filled 2015-10-19: qty 10

## 2015-10-20 NOTE — Progress Notes (Signed)
  Oncology Nurse Navigator Documentation  Navigator Location: [facility] (10/15/15 1100) Navigator Encounter Type: Clinic/MDC (10/15/15 1100)             Treatment Phase: Active Tx (10/15/15 1100)       Met with patient during Established Patient appt with Dr. Alvy Bimler.  He was accompanied by his Marvin [name].  He reported:  Resolving chest soreness s/p 2/13 Code.  Manageable throat soreness.  No follow-up to date with psychiatrist bco of recent hospitalization.  I will follow-up.  Increased anxiety.  Requested increased Prozac dosage.  Dr. Alvy Bimler indicated psychiatrist can adjust as he deems appropriate. Pt voiced understanding.  BMs q2d. [name] and his aunt voiced understanding of upcoming chemo therapy regime.  Addendum:  Mike Craze, NP, is following up re appt with psychiatrist per her discussion with Dr. Alvy Bimler.  [name], RN, BSN, [place] at [facility] [phone]                         Time Spent with Patient: 45 (10/15/15 1100)

## 2015-10-26 ENCOUNTER — Encounter: Payer: Self-pay | Admitting: General Practice

## 2015-10-26 NOTE — Progress Notes (Signed)
Telephone  Left voicemail on client's answering machine to check in and see how client is doing. Further, counselor asked client if he wanted to re-schedule a counseling appointment for next week, and asked him to call back to let the counselor know.  Wendall Papa, MS, [place], [place], [place], [place]

## 2015-10-29 ENCOUNTER — Ambulatory Visit (HOSPITAL_BASED_OUTPATIENT_CLINIC_OR_DEPARTMENT_OTHER): Payer: Medicaid Other | Admitting: Hematology and Oncology

## 2015-10-29 ENCOUNTER — Encounter: Payer: Self-pay | Admitting: *Deleted

## 2015-10-29 ENCOUNTER — Telehealth: Payer: Self-pay | Admitting: Hematology and Oncology

## 2015-10-29 ENCOUNTER — Other Ambulatory Visit: Payer: Self-pay | Admitting: Hematology and Oncology

## 2015-10-29 VITALS — BP 97/71 | HR 96 | Temp 98.4°F | Resp 18 | Ht 73.0 in | Wt 138.3 lb

## 2015-10-29 DIAGNOSIS — F411 Generalized anxiety disorder: Secondary | ICD-10-CM

## 2015-10-29 DIAGNOSIS — I951 Orthostatic hypotension: Secondary | ICD-10-CM | POA: Diagnosis not present

## 2015-10-29 DIAGNOSIS — T451X5A Adverse effect of antineoplastic and immunosuppressive drugs, initial encounter: Secondary | ICD-10-CM | POA: Insufficient documentation

## 2015-10-29 DIAGNOSIS — G8929 Other chronic pain: Secondary | ICD-10-CM | POA: Diagnosis not present

## 2015-10-29 DIAGNOSIS — R112 Nausea with vomiting, unspecified: Secondary | ICD-10-CM | POA: Diagnosis not present

## 2015-10-29 DIAGNOSIS — K1231 Oral mucositis (ulcerative) due to antineoplastic therapy: Secondary | ICD-10-CM

## 2015-10-29 DIAGNOSIS — C321 Malignant neoplasm of supraglottis: Secondary | ICD-10-CM

## 2015-10-29 MED ORDER — MAGIC MOUTHWASH: 10.0000 mL | Freq: Four times a day (QID) | ORAL | Status: DC | PRN

## 2015-10-29 MED ORDER — ONDANSETRON HCL 8 MG PO TABS: 8.0000 mg | ORAL_TABLET | Freq: Three times a day (TID) | ORAL | Status: DC | PRN

## 2015-10-29 MED ORDER — LORAZEPAM 0.5 MG PO TABS: 0.5000 mg | ORAL_TABLET | Freq: Three times a day (TID) | ORAL | Status: DC

## 2015-10-29 MED ORDER — PROCHLORPERAZINE MALEATE 10 MG PO TABS: 10.0000 mg | ORAL_TABLET | Freq: Four times a day (QID) | ORAL | Status: DC | PRN

## 2015-10-29 MED ORDER — MORPHINE SULFATE ER 30 MG PO TBCR: 30.0000 mg | EXTENDED_RELEASE_TABLET | Freq: Two times a day (BID) | ORAL | Status: DC

## 2015-10-29 MED ORDER — MORPHINE SULFATE 30 MG PO TABS: 30.0000 mg | ORAL_TABLET | ORAL | Status: DC | PRN

## 2015-10-29 MED ORDER — TRAZODONE HCL 100 MG PO TABS: 100.0000 mg | ORAL_TABLET | Freq: Every evening | ORAL | Status: DC | PRN

## 2015-10-29 NOTE — Assessment & Plan Note (Signed)
He has moderate mouth pain related to recent treatment He felt that the fentanyl is not helping him, which he suspects is related to lack of body fat for absorption given his thin body habitus. I recommend a trial of combination of MS Contin and IR morphine for pain control. His pain is under control now We discussed narcotic refill policy. I refilled his prescriptions today

## 2015-10-29 NOTE — Assessment & Plan Note (Signed)
He felt that his depression is not under control but he is not suicidal. He will continue medication as prescribed by recent psychiatrist I will make sure he gets a follow-up appointment for medication adjustment I refill his prescription lorazepam

## 2015-10-29 NOTE — Assessment & Plan Note (Signed)
This could be related to treatment. I will add Emend. I plan to reduce 5-FU by 10%. I refilled prescription anti-emetics.

## 2015-10-29 NOTE — Progress Notes (Signed)
[place] OFFICE PROGRESS NOTE  Patient Care Team: No Pcp Per Patient as PCP - General (General Practice) [name], MD as Consulting Physician (Otolaryngology) [name], MD as Attending Physician (Radiation Oncology) [name], MD as Consulting Physician (Hematology and Oncology) Leota Sauers, RN as Oncology Nurse Navigator Lenn Cal, DDS as Consulting Physician (Dentistry)  SUMMARY OF ONCOLOGIC HISTORY: Oncology History   Carcinoma of supraglottis   Staging form: Larynx - Supraglottis, AJCC 7th Edition     Clinical stage from 03/13/2015: Stage IVA (T3, N2b, M0) - Signed by [name], MD on 03/13/2015     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[place])   01/22/2015 Imaging CT scan showed enhancing infiltrative mass centered at the right piriform sinus with bilateral neck lymphadenopathy   02/22/2015 - 03/08/2015 Hospital Admission He was admitted to the hospital for management of advanced supraglottic cancer status post tracheostomy, biopsy, dental extraction, placement of feeding tube and subsequent discharge to skilled nursing facility   02/22/2015 Pathology Results Accession: [id number] 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   03/09/2015 PET scan 1)  Right piriform sinus mass with right level 2 nodal metastasis.  2)  Hypermetabolic thoracic nodes which are at least partially felt to be reactive.  3)  No evidence of subdiaphragmatic metastatic disease.    03/16/2015 Procedure He has placement of port   03/21/2015 - 05/04/2015 Chemotherapy He received high dose cisplatin x 3 cycles   03/21/2015 - 05/09/2015 Radiation Therapy Tomotherapy to supraglottic tumor and bilateral neck:  70 Gy in 35 fractions to gross disease, 63 Gy in 35 fractions to high risk nodal echelons, and 56 Gy in 35 fractions to intermediate risk nodal echelons.   05/09/2015 Miscellaneous He went  to the ED with fall   06/20/2015 Procedure Tracheostomy was removed   09/06/2015 - 09/08/2015 Hospital Admission  he was admitted to the hospital with alcohol intoxication, dehydration and pain. CT scan of the chest show bilateral metastatic disease to the lung   10/01/2015 Imaging  PET CT scan showed diffuse, multiple hypermetabolic activity in both lungs suspicious for metastatic cancer   10/01/2015 Adverse Reaction he received cetuximab, complicated by anaphylactic reaction   10/01/2015 - 10/03/2015 Hospital Admission He was admitted to the hospital, intubated for cardiac arrest   10/15/2015 -  Chemotherapy He began palliative chemo with carboplatin and 5FU   10/29/2015 Adverse Reaction 5FU dose reduce by 10% due to nausea and vomiting    INTERVAL HISTORY: Please see below for problem oriented charting. He returns for further follow-up. He had mucositis, nausea and vomiting with last cycle chemotherapy, resolved. His pain is under controlled. He has not seen psychiatrist since dismissal from the hospital. He denies suicidal ideation  REVIEW OF SYSTEMS:   Constitutional: Denies fevers, chills or abnormal weight loss Eyes: Denies blurriness of vision Respiratory: Denies cough, dyspnea or wheezes Cardiovascular: Denies palpitation, chest discomfort or lower extremity swelling Skin: Denies abnormal skin rashes Lymphatics: Denies new lymphadenopathy or easy bruising Neurological:Denies numbness, tingling or new weaknesses Behavioral/Psych: Mood is stable, no new changes  All other systems were reviewed with the patient and are negative.  I have reviewed the past medical history, past surgical history, social history and family history with the patient and they are unchanged from previous note.  ALLERGIES:  is allergic to cetuximab.  MEDICATIONS:  Current Outpatient Prescriptions  Medication Sig Dispense Refill  .  fludrocortisone (FLORINEF) 0.1 MG tablet Take 2 tablets (0.2 mg total) by mouth  daily. 90 tablet 6  . FLUoxetine (PROZAC) 20 MG capsule Take 1 capsule (20 mg total) by mouth daily. 30 capsule 0  . LORazepam (ATIVAN) 0.5 MG tablet Take 1 tablet (0.5 mg total) by mouth 3 (three) times daily. 90 tablet 0  . morphine (MS CONTIN) 30 MG 12 hr tablet Take 1 tablet (30 mg total) by mouth every 12 (twelve) hours. 60 tablet 0  . morphine (MSIR) 15 MG tablet Take 1 tablet (15 mg total) by mouth every 4 (four) hours as needed for severe pain. 60 tablet 0  . morphine (MSIR) 30 MG tablet Take 1 tablet (30 mg total) by mouth every 4 (four) hours as needed. 90 tablet 0  . potassium chloride SA (K-DUR,KLOR-CON) 20 MEQ tablet Take 1 tablet (20 mEq total) by mouth 2 (two) times daily. 14 tablet 0  . senna (SENOKOT) 8.6 MG TABS tablet Take 1 tablet (8.6 mg total) by mouth 2 (two) times daily. 60 each 0  . traZODone (DESYREL) 100 MG tablet Take 1 tablet (100 mg total) by mouth at bedtime as needed for sleep. 30 tablet 3  . magic mouthwash SOLN Take 10 mLs by mouth 4 (four) times daily as needed for mouth pain (Swish and swallow). 480 mL 1  . ondansetron (ZOFRAN) 8 MG tablet Take 1 tablet (8 mg total) by mouth every 8 (eight) hours as needed for nausea. 60 tablet 3  . prochlorperazine (COMPAZINE) 10 MG tablet Take 1 tablet (10 mg total) by mouth every 6 (six) hours as needed. 60 tablet 3   No current facility-administered medications for this visit.   Facility-Administered Medications Ordered in Other Visits  Medication Dose Route Frequency Provider Last Rate Last Dose  . 0.9 %  sodium chloride infusion   Intravenous Once [name], MD      . sodium chloride flush (NS) 0.9 % injection 10 mL  10 mL Intravenous PRN [name], MD   10 mL at 10/01/15 0956    PHYSICAL EXAMINATION: ECOG PERFORMANCE STATUS: 1 - Symptomatic but completely ambulatory  Filed Vitals:   10/29/15 1052  BP: 97/71  Pulse: 96  Temp: 98.4 F (36.9 C)  Resp: 18   Filed Weights   10/29/15 1052  Weight: 138 lb 4.8 oz  (62.732 kg)    GENERAL:alert, no distress and comfortable. He looks thin. He looks anxious SKIN: skin color, texture, turgor are normal, no rashes or significant lesions EYES: normal, Conjunctiva are pink and non-injected, sclera clear OROPHARYNX:no exudate, no erythema and lips, buccal mucosa, and tongue normal  NECK: supple, thyroid normal size, non-tender, without nodularity LYMPH:  no palpable lymphadenopathy in the cervical, axillary or inguinal LUNGS: clear to auscultation and percussion with normal breathing effort HEART: regular rate & rhythm and no murmurs and no lower extremity edema ABDOMEN:abdomen soft, non-tender and normal bowel sounds Musculoskeletal:no cyanosis of digits and no clubbing  NEURO: alert & oriented x 3 with fluent speech, no focal motor/sensory deficits  LABORATORY DATA:  I have reviewed the data as listed    Component Value Date/Time   NA 143 10/15/2015 1048   NA 143 10/03/2015 0630   NA 140 07/19/2015   K 3.3* 10/15/2015 1048   K 3.6 10/03/2015 0630   CL 108 10/03/2015 0630   CO2 29 10/15/2015 1048   CO2 27 10/03/2015 0630   GLUCOSE 84 10/15/2015 1048   GLUCOSE 156* 10/03/2015 0630  BUN 12.1 10/15/2015 1048   BUN 11 10/03/2015 0630   BUN 10 07/19/2015   CREATININE 0.7 10/15/2015 1048   CREATININE 0.54* 10/03/2015 0630   CREATININE 0.65 08/16/2015 1538   CREATININE 0.6 07/19/2015   CALCIUM 9.0 10/15/2015 1048   CALCIUM 8.7* 10/03/2015 0630   PROT 6.4 10/15/2015 1048   PROT 5.7* 10/01/2015 1331   ALBUMIN 3.4* 10/15/2015 1048   ALBUMIN 3.1* 10/01/2015 1331   AST 16 10/15/2015 1048   AST 26 10/01/2015 1331   ALT 10 10/15/2015 1048   ALT 13* 10/01/2015 1331   ALKPHOS 103 10/15/2015 1048   ALKPHOS 109 10/01/2015 1331   BILITOT 0.50 10/15/2015 1048   BILITOT 0.5 10/01/2015 1331   GFRNONAA >60 10/03/2015 0630   GFRAA >60 10/03/2015 0630    No results found for: SPEP, UPEP  Lab Results  Component Value Date   WBC 4.9 10/15/2015    NEUTROABS 3.6 10/15/2015   HGB 12.7* 10/15/2015   HCT 38.5 10/15/2015   MCV 96.1 10/15/2015   PLT 202 10/15/2015      Chemistry      Component Value Date/Time   NA 143 10/15/2015 1048   NA 143 10/03/2015 0630   NA 140 07/19/2015   K 3.3* 10/15/2015 1048   K 3.6 10/03/2015 0630   CL 108 10/03/2015 0630   CO2 29 10/15/2015 1048   CO2 27 10/03/2015 0630   BUN 12.1 10/15/2015 1048   BUN 11 10/03/2015 0630   BUN 10 07/19/2015   CREATININE 0.7 10/15/2015 1048   CREATININE 0.54* 10/03/2015 0630   CREATININE 0.65 08/16/2015 1538   CREATININE 0.6 07/19/2015   GLU 82 07/19/2015      Component Value Date/Time   CALCIUM 9.0 10/15/2015 1048   CALCIUM 8.7* 10/03/2015 0630   ALKPHOS 103 10/15/2015 1048   ALKPHOS 109 10/01/2015 1331   AST 16 10/15/2015 1048   AST 26 10/01/2015 1331   ALT 10 10/15/2015 1048   ALT 13* 10/01/2015 1331   BILITOT 0.50 10/15/2015 1048   BILITOT 0.5 10/01/2015 1331      ASSESSMENT & PLAN:  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[place]) The patient had significant mucositis, nausea and vomiting with cycle 1 of treatment, subsequently resolved. I plan to reduce the dose of chemotherapy by 10% for cycle 2 and will reassess in 2 weeks. After 2 cycles of chemotherapy, we will repeat imaging study to assess response to treatment  Oral mucositis due to antineoplastic therapy I recommend a trial of Magic mouthwash. I plan to reduce 5-FU by 10%  Orthostatic hypotension His blood pressure is low today could be related to recent nausea and vomiting He will continue on Florinef and are recommended increase oral intake as tolerated  Anxiety disorder  He felt that his depression is not under control but he is not suicidal. He will continue medication as prescribed by recent psychiatrist I will make sure he gets a follow-up appointment for medication adjustment I refill his prescription lorazepam    Chronic pain He has moderate mouth pain related to recent treatment He  felt that the fentanyl is not helping him, which he suspects is related to lack of body fat for absorption given his thin body habitus. I recommend a trial of combination of MS Contin and IR morphine for pain control. His pain is under control now We discussed narcotic refill policy. I refilled his prescriptions today    Chemotherapy induced nausea and vomiting This could be related to treatment. I will  add Emend. I plan to reduce 5-FU by 10%. I refilled prescription anti-emetics.   No orders of the defined types were placed in this encounter.   All questions were answered. The patient knows to call the clinic with any problems, questions or concerns. No barriers to learning was detected. I spent 25 minutes counseling the patient face to face. The total time spent in the appointment was 30 minutes and more than 50% was on counseling and review of test results     [facility], [place], MD 10/29/2015 1:06 PM

## 2015-10-29 NOTE — Telephone Encounter (Signed)
per pof to sch pt appt-gave pt copy of avs °

## 2015-10-29 NOTE — Assessment & Plan Note (Signed)
The patient had significant mucositis, nausea and vomiting with cycle 1 of treatment, subsequently resolved. I plan to reduce the dose of chemotherapy by 10% for cycle 2 and will reassess in 2 weeks. After 2 cycles of chemotherapy, we will repeat imaging study to assess response to treatment

## 2015-10-29 NOTE — Assessment & Plan Note (Signed)
I recommend a trial of Magic mouthwash. I plan to reduce 5-FU by 10%

## 2015-10-29 NOTE — Assessment & Plan Note (Signed)
His blood pressure is low today could be related to recent nausea and vomiting He will continue on Florinef and are recommended increase oral intake as tolerated

## 2015-10-29 NOTE — Progress Notes (Signed)
  Oncology Nurse Navigator Documentation  Navigator Location: [facility] Onc (10/29/15 1115) Navigator Encounter Type: Follow-up Appt (10/29/15 1115)   Abnormal Finding Date: 02/22/15 (10/29/15 1115) Confirmed Diagnosis Date: 02/22/15 (10/29/15 1115) Surgery Date: 02/22/15 (10/29/15 1115) Treatment Initiated Date: 03/21/15 (10/29/15 1115) Patient Visit Type: MedOnc (10/29/15 1115) Treatment Phase: Active Tx (10/29/15 1115) Barriers/Navigation Needs: No barriers at this time (10/29/15 1115)   Interventions: None required (10/29/15 1115)       Met with patient during Established Patient appt with Dr. Alvy Bimler.  He was accompanied by his aunt, Thayer Headings.  He is progressing with tmts without issue. He stated he continues to live with his brother. He did not express any needs or concerns at this time, I encouraged him to contact me if that changes, he verbalized understanding.  [name], RN, BSN, [place] at [place] [phone]                    Time Spent with Patient: 30 (10/29/15 1115)

## 2015-11-02 ENCOUNTER — Telehealth: Payer: Self-pay | Admitting: *Deleted

## 2015-11-02 ENCOUNTER — Other Ambulatory Visit: Payer: Self-pay | Admitting: Hematology and Oncology

## 2015-11-02 ENCOUNTER — Other Ambulatory Visit: Payer: Self-pay | Admitting: *Deleted

## 2015-11-02 ENCOUNTER — Telehealth: Payer: Self-pay | Admitting: Adult Health

## 2015-11-02 NOTE — Telephone Encounter (Signed)
I received a call from Ms. Grandville Silos regarding her nephew, Mr. Soderman.  She tells me that she is very worried about him because he is excessively sleepy, has no energy, has lost more weight even though "he says he's eating".  She expresses concern and would like Dr. Alvy Bimler to work him in to be seen by her on Monday when they come to the cancer center for labs and chemo.  I told Ms. Grandville Silos that often patients do experience sometimes severe fatigue when undergoing chemotherapy, but I thanked her for calling us to make [place] aware as he may need some medication adjustment after Dr. Alvy Bimler evaluates him again.  She said, "He was out shooting basketball on the last warm day we had and today he hasn't gotten out of the bed at all."     I let her know that I would share her concerns with Dr. Alvy Bimler and [name], RN-H&N Navigator and we would do our best to get them worked in for visit with Dr. Alvy Bimler as soon as possible.  She asked that we return her call for that appt at [phone].  I have shared this note with the patient's team.    Mike Craze, NP [place] [phone]

## 2015-11-02 NOTE — Telephone Encounter (Signed)
  Oncology Nurse Navigator Documentation  Navigator Location: [facility] (11/02/15 1400) Navigator Encounter Type: Telephone (11/02/15 1400) Telephone: [facility] Crocker Call;Appt Confirmation/Clarification;Symptom Mgt (11/02/15 1400)       Spoke with [name], Mr. Mullinix's aunt, in follow-up to her call to Mike Craze, NP, and Gretchen's follow-up with Dr. Alvy Bimler.  I informed her that Dr. Alvy Bimler will see Juanda Crumble on Monday at 10:45, confirmed her understanding of 9:45 arrival on Monday for 10:00 lab and flush.  I relayed to her Dr. Calton Dach guidance to stop MS Contin and use IR morphine as needed to reduce excessive sedation.  She voiced understanding.  I encouraged her to take [place] to the ER this weekend if his condition warrants.  [name], RN, BSN, [place] at [place] [phone]                                     Time Spent with Patient: 15 (11/02/15 1400)

## 2015-11-02 NOTE — Telephone Encounter (Signed)
Thanks, Elzie Rings for the update Monday morning schedule is very tight. I will try to move patient around to accomodate an add on appointment on Monday

## 2015-11-05 ENCOUNTER — Ambulatory Visit (HOSPITAL_BASED_OUTPATIENT_CLINIC_OR_DEPARTMENT_OTHER): Payer: Medicaid Other | Admitting: Hematology and Oncology

## 2015-11-05 ENCOUNTER — Telehealth: Payer: Self-pay | Admitting: Hematology and Oncology

## 2015-11-05 ENCOUNTER — Other Ambulatory Visit (HOSPITAL_BASED_OUTPATIENT_CLINIC_OR_DEPARTMENT_OTHER): Payer: Medicaid Other

## 2015-11-05 ENCOUNTER — Encounter: Payer: Self-pay | Admitting: Hematology and Oncology

## 2015-11-05 ENCOUNTER — Encounter: Payer: Self-pay | Admitting: *Deleted

## 2015-11-05 ENCOUNTER — Other Ambulatory Visit: Payer: Self-pay | Admitting: Hematology and Oncology

## 2015-11-05 ENCOUNTER — Ambulatory Visit (HOSPITAL_BASED_OUTPATIENT_CLINIC_OR_DEPARTMENT_OTHER): Payer: Medicaid Other

## 2015-11-05 VITALS — BP 94/61 | HR 84 | Temp 97.9°F | Resp 18 | Ht 73.0 in | Wt 136.5 lb

## 2015-11-05 DIAGNOSIS — Z452 Encounter for adjustment and management of vascular access device: Secondary | ICD-10-CM | POA: Diagnosis present

## 2015-11-05 DIAGNOSIS — I951 Orthostatic hypotension: Secondary | ICD-10-CM

## 2015-11-05 DIAGNOSIS — C321 Malignant neoplasm of supraglottis: Secondary | ICD-10-CM | POA: Diagnosis present

## 2015-11-05 DIAGNOSIS — D61810 Antineoplastic chemotherapy induced pancytopenia: Secondary | ICD-10-CM | POA: Diagnosis not present

## 2015-11-05 DIAGNOSIS — E43 Unspecified severe protein-calorie malnutrition: Secondary | ICD-10-CM

## 2015-11-05 DIAGNOSIS — G8929 Other chronic pain: Secondary | ICD-10-CM

## 2015-11-05 DIAGNOSIS — Z5111 Encounter for antineoplastic chemotherapy: Secondary | ICD-10-CM | POA: Diagnosis present

## 2015-11-05 DIAGNOSIS — Z95828 Presence of other vascular implants and grafts: Secondary | ICD-10-CM

## 2015-11-05 DIAGNOSIS — F331 Major depressive disorder, recurrent, moderate: Secondary | ICD-10-CM

## 2015-11-05 DIAGNOSIS — T451X5A Adverse effect of antineoplastic and immunosuppressive drugs, initial encounter: Secondary | ICD-10-CM

## 2015-11-05 LAB — CBC WITH DIFFERENTIAL/PLATELET
BASO%: 0.4 % (ref 0.0–2.0)
BASOS ABS: 0 10*3/uL (ref 0.0–0.1)
EOS ABS: 0 10*3/uL (ref 0.0–0.5)
EOS%: 0.4 % (ref 0.0–7.0)
HEMATOCRIT: 32.2 % — AB (ref 38.4–49.9)
HEMOGLOBIN: 11 g/dL — AB (ref 13.0–17.1)
LYMPH%: 23.3 % (ref 14.0–49.0)
MCH: 32.1 pg (ref 27.2–33.4)
MCHC: 34.2 g/dL (ref 32.0–36.0)
MCV: 93.9 fL (ref 79.3–98.0)
MONO#: 0.3 10*3/uL (ref 0.1–0.9)
MONO%: 12.4 % (ref 0.0–14.0)
NEUT%: 63.5 % (ref 39.0–75.0)
NEUTROS ABS: 1.7 10*3/uL (ref 1.5–6.5)
PLATELETS: 143 10*3/uL (ref 140–400)
RBC: 3.43 10*6/uL — ABNORMAL LOW (ref 4.20–5.82)
RDW: 13.7 % (ref 11.0–14.6)
WBC: 2.7 10*3/uL — AB (ref 4.0–10.3)
lymph#: 0.6 10*3/uL — ABNORMAL LOW (ref 0.9–3.3)

## 2015-11-05 LAB — COMPREHENSIVE METABOLIC PANEL
ALBUMIN: 3.2 g/dL — AB (ref 3.5–5.0)
ALK PHOS: 92 U/L (ref 40–150)
ANION GAP: 6 meq/L (ref 3–11)
AST: 15 U/L (ref 5–34)
BILIRUBIN TOTAL: 0.39 mg/dL (ref 0.20–1.20)
BUN: 12.7 mg/dL (ref 7.0–26.0)
CALCIUM: 9.2 mg/dL (ref 8.4–10.4)
CO2: 34 mEq/L — ABNORMAL HIGH (ref 22–29)
CREATININE: 0.7 mg/dL (ref 0.7–1.3)
Chloride: 101 mEq/L (ref 98–109)
EGFR: >90 mL/min/{1.73_m2} (ref >90)
Glucose: 83 mg/dl (ref 70–140)
Potassium: 3.7 mEq/L (ref 3.5–5.1)
Sodium: 141 mEq/L (ref 136–145)
TOTAL PROTEIN: 6.4 g/dL (ref 6.4–8.3)

## 2015-11-05 MED ORDER — SODIUM CHLORIDE 0.9 % IV SOLN
900.0000 mg/m2/d | INTRAVENOUS | Status: DC
  Administered 2015-11-05: 6500 mg via INTRAVENOUS
  Filled 2015-11-05: qty 130

## 2015-11-05 MED ORDER — FLUDROCORTISONE ACETATE 0.1 MG PO TABS: 0.2000 mg | ORAL_TABLET | Freq: Every day | ORAL | Status: DC

## 2015-11-05 MED ORDER — SODIUM CHLORIDE 0.9 % IV SOLN
Freq: Once | INTRAVENOUS | Status: AC
  Administered 2015-11-05: 12:00:00 via INTRAVENOUS

## 2015-11-05 MED ORDER — SODIUM CHLORIDE 0.9% FLUSH
10.0000 mL | INTRAVENOUS | Status: DC | PRN
  Administered 2015-11-05: 10 mL via INTRAVENOUS
  Filled 2015-11-05: qty 10

## 2015-11-05 MED ORDER — PALONOSETRON HCL INJECTION 0.25 MG/5ML
INTRAVENOUS | Status: AC
  Filled 2015-11-05: qty 5

## 2015-11-05 MED ORDER — PALONOSETRON HCL INJECTION 0.25 MG/5ML
0.2500 mg | Freq: Once | INTRAVENOUS | Status: AC
Start: 2015-11-05 — End: 2015-11-05
  Administered 2015-11-05: 0.25 mg via INTRAVENOUS

## 2015-11-05 MED ORDER — SODIUM CHLORIDE 0.9 % IV SOLN
668.0000 mg | Freq: Once | INTRAVENOUS | Status: AC
  Administered 2015-11-05: 670 mg via INTRAVENOUS
  Filled 2015-11-05: qty 67

## 2015-11-05 MED ORDER — SODIUM CHLORIDE 0.9 % IV SOLN
Freq: Once | INTRAVENOUS | Status: AC
  Administered 2015-11-05: 12:00:00 via INTRAVENOUS
  Filled 2015-11-05: qty 5

## 2015-11-05 NOTE — Progress Notes (Signed)
[facility] OFFICE PROGRESS NOTE  Patient Care Team: No Pcp Per Patient as PCP - General (General Practice) [name], MD as Consulting Physician (Otolaryngology) [name], MD as Attending Physician (Radiation Oncology) [name], MD as Consulting Physician (Hematology and Oncology) Leota Sauers, RN as Oncology Nurse Navigator Lenn Cal, DDS as Consulting Physician (Dentistry)  SUMMARY OF ONCOLOGIC HISTORY: Oncology History   Carcinoma of supraglottis   Staging form: Larynx - Supraglottis, AJCC 7th Edition     Clinical stage from 03/13/2015: Stage IVA (T3, N2b, M0) - Signed by [name], MD on 03/13/2015     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[place])   01/22/2015 Imaging CT scan showed enhancing infiltrative mass centered at the right piriform sinus with bilateral neck lymphadenopathy   02/22/2015 - 03/08/2015 Hospital Admission He was admitted to the hospital for management of advanced supraglottic cancer status post tracheostomy, biopsy, dental extraction, placement of feeding tube and subsequent discharge to skilled nursing facility   02/22/2015 Pathology Results Accession: [id number] 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   03/09/2015 PET scan 1)  Right piriform sinus mass with right level 2 nodal metastasis.  2)  Hypermetabolic thoracic nodes which are at least partially felt to be reactive.  3)  No evidence of subdiaphragmatic metastatic disease.    03/16/2015 Procedure He has placement of port   03/21/2015 - 05/04/2015 Chemotherapy He received high dose cisplatin x 3 cycles   03/21/2015 - 05/09/2015 Radiation Therapy Tomotherapy to supraglottic tumor and bilateral neck:  70 Gy in 35 fractions to gross disease, 63 Gy in 35 fractions to high risk nodal echelons, and 56 Gy in 35 fractions to intermediate risk nodal echelons.   05/09/2015 Miscellaneous He went  to the ED with fall   06/20/2015 Procedure Tracheostomy was removed   09/06/2015 - 09/08/2015 Hospital Admission  he was admitted to the hospital with alcohol intoxication, dehydration and pain. CT scan of the chest show bilateral metastatic disease to the lung   10/01/2015 Imaging  PET CT scan showed diffuse, multiple hypermetabolic activity in both lungs suspicious for metastatic cancer   10/01/2015 Adverse Reaction he received cetuximab, complicated by anaphylactic reaction   10/01/2015 - 10/03/2015 Hospital Admission He was admitted to the hospital, intubated for cardiac arrest   10/15/2015 -  Chemotherapy He began palliative chemo with carboplatin and 5FU   10/29/2015 Adverse Reaction 5FU dose reduce by 10% due to nausea and vomiting    INTERVAL HISTORY: Please see below for problem oriented charting. He is seen prior to cycle 2 of treatment today. He complained of excessive sedation last week. Since we recommended him to discontinue long-acting morphine, he is more alert His pain is under good control He denies chest pain or dizziness Denies recent nausea or vomiting Denies recent infection. No cough.  REVIEW OF SYSTEMS:   Constitutional: Denies fevers, chills or abnormal weight loss Eyes: Denies blurriness of vision Ears, nose, mouth, throat, and face: Denies mucositis or sore throat Respiratory: Denies cough, dyspnea or wheezes Cardiovascular: Denies palpitation, chest discomfort or lower extremity swelling Gastrointestinal:  Denies nausea, heartburn or change in bowel habits Skin: Denies abnormal skin rashes Lymphatics: Denies new lymphadenopathy or easy bruising Neurological:Denies numbness, tingling or new weaknesses Behavioral/Psych: Mood is stable, no new changes  All other systems were reviewed with the patient and are negative.  I have reviewed the past medical history, past  surgical history, social history and family history with the patient and they are unchanged from  previous note.  ALLERGIES:  is allergic to cetuximab.  MEDICATIONS:  Current Outpatient Prescriptions  Medication Sig Dispense Refill  . fludrocortisone (FLORINEF) 0.1 MG tablet Take 2 tablets (0.2 mg total) by mouth daily. 90 tablet 6  . FLUoxetine (PROZAC) 20 MG capsule Take 1 capsule (20 mg total) by mouth daily. 30 capsule 0  . LORazepam (ATIVAN) 0.5 MG tablet Take 1 tablet (0.5 mg total) by mouth 3 (three) times daily. 90 tablet 0  . magic mouthwash SOLN Take 10 mLs by mouth 4 (four) times daily as needed for mouth pain (Swish and swallow). 480 mL 1  . morphine (MS CONTIN) 30 MG 12 hr tablet Take 1 tablet (30 mg total) by mouth every 12 (twelve) hours. 60 tablet 0  . morphine (MSIR) 15 MG tablet Take 1 tablet (15 mg total) by mouth every 4 (four) hours as needed for severe pain. 60 tablet 0  . morphine (MSIR) 30 MG tablet Take 1 tablet (30 mg total) by mouth every 4 (four) hours as needed. 90 tablet 0  . ondansetron (ZOFRAN) 8 MG tablet Take 1 tablet (8 mg total) by mouth every 8 (eight) hours as needed for nausea. 60 tablet 3  . potassium chloride SA (K-DUR,KLOR-CON) 20 MEQ tablet Take 1 tablet (20 mEq total) by mouth 2 (two) times daily. 14 tablet 0  . prochlorperazine (COMPAZINE) 10 MG tablet Take 1 tablet (10 mg total) by mouth every 6 (six) hours as needed. 60 tablet 3  . senna (SENOKOT) 8.6 MG TABS tablet Take 1 tablet (8.6 mg total) by mouth 2 (two) times daily. 60 each 0  . traZODone (DESYREL) 100 MG tablet Take 1 tablet (100 mg total) by mouth at bedtime as needed for sleep. 30 tablet 3   No current facility-administered medications for this visit.   Facility-Administered Medications Ordered in Other Visits  Medication Dose Route Frequency Provider Last Rate Last Dose  . 0.9 %  sodium chloride infusion   Intravenous Once [name], MD      . fluorouracil (ADRUCIL) 6,500 mg in sodium chloride 0.9 % 120 mL chemo infusion  900 mg/m2/day (Treatment Plan Actual) Intravenous 4 days  [name], MD   6,500 mg at 11/05/15 1400  . sodium chloride flush (NS) 0.9 % injection 10 mL  10 mL Intravenous PRN [name], MD   10 mL at 10/01/15 0956    PHYSICAL EXAMINATION: ECOG PERFORMANCE STATUS: 1 - Symptomatic but completely ambulatory  Filed Vitals:   11/05/15 1045  BP: 94/61  Pulse: 84  Temp: 97.9 F (36.6 C)  Resp: 18   Filed Weights   11/05/15 1045  Weight: 136 lb 8 oz (61.916 kg)    GENERAL:alert, no distress and comfortable. He looks thin SKIN: skin color, texture, turgor are normal, no rashes or significant lesions EYES: normal, Conjunctiva are pink and non-injected, sclera clear OROPHARYNX:no exudate, no erythema and lips, buccal mucosa, and tongue normal  NECK: supple, thyroid normal size, non-tender, without nodularity LYMPH:  no palpable lymphadenopathy in the cervical, axillary or inguinal LUNGS: clear to auscultation and percussion with normal breathing effort HEART: regular rate & rhythm and no murmurs and no lower extremity edema ABDOMEN:abdomen soft, non-tender and normal bowel sounds Musculoskeletal:no cyanosis of digits and no clubbing  NEURO: alert & oriented x 3 with fluent speech, no focal motor/sensory deficits  LABORATORY DATA:  I have reviewed the data  as listed    Component Value Date/Time   NA 141 11/05/2015 1013   NA 143 10/03/2015 0630   NA 140 07/19/2015   K 3.7 11/05/2015 1013   K 3.6 10/03/2015 0630   CL 108 10/03/2015 0630   CO2 34* 11/05/2015 1013   CO2 27 10/03/2015 0630   GLUCOSE 83 11/05/2015 1013   GLUCOSE 156* 10/03/2015 0630   BUN 12.7 11/05/2015 1013   BUN 11 10/03/2015 0630   BUN 10 07/19/2015   CREATININE 0.7 11/05/2015 1013   CREATININE 0.54* 10/03/2015 0630   CREATININE 0.65 08/16/2015 1538   CREATININE 0.6 07/19/2015   CALCIUM 9.2 11/05/2015 1013   CALCIUM 8.7* 10/03/2015 0630   PROT 6.4 11/05/2015 1013   PROT 5.7* 10/01/2015 1331   ALBUMIN 3.2* 11/05/2015 1013   ALBUMIN 3.1* 10/01/2015 1331   AST 15  11/05/2015 1013   AST 26 10/01/2015 1331   ALT <9 11/05/2015 1013   ALT 13* 10/01/2015 1331   ALKPHOS 92 11/05/2015 1013   ALKPHOS 109 10/01/2015 1331   BILITOT 0.39 11/05/2015 1013   BILITOT 0.5 10/01/2015 1331   GFRNONAA >60 10/03/2015 0630   GFRAA >60 10/03/2015 0630    No results found for: SPEP, UPEP  Lab Results  Component Value Date   WBC 2.7* 11/05/2015   NEUTROABS 1.7 11/05/2015   HGB 11.0* 11/05/2015   HCT 32.2* 11/05/2015   MCV 93.9 11/05/2015   PLT 143 11/05/2015      Chemistry      Component Value Date/Time   NA 141 11/05/2015 1013   NA 143 10/03/2015 0630   NA 140 07/19/2015   K 3.7 11/05/2015 1013   K 3.6 10/03/2015 0630   CL 108 10/03/2015 0630   CO2 34* 11/05/2015 1013   CO2 27 10/03/2015 0630   BUN 12.7 11/05/2015 1013   BUN 11 10/03/2015 0630   BUN 10 07/19/2015   CREATININE 0.7 11/05/2015 1013   CREATININE 0.54* 10/03/2015 0630   CREATININE 0.65 08/16/2015 1538   CREATININE 0.6 07/19/2015   GLU 82 07/19/2015      Component Value Date/Time   CALCIUM 9.2 11/05/2015 1013   CALCIUM 8.7* 10/03/2015 0630   ALKPHOS 92 11/05/2015 1013   ALKPHOS 109 10/01/2015 1331   AST 15 11/05/2015 1013   AST 26 10/01/2015 1331   ALT <9 11/05/2015 1013   ALT 13* 10/01/2015 1331   BILITOT 0.39 11/05/2015 1013   BILITOT 0.5 10/01/2015 1331      ASSESSMENT & PLAN:  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[place]) The patient had significant mucositis, nausea and vomiting with cycle 1 of treatment, subsequently resolved. I plan to reduce the dose of chemotherapy by 10% and we will repeat imaging study to assess response to treatment  Protein-calorie malnutrition, severe ([place]) He is eating better since the last time I saw him  He is able to swallow most foods by mouth. I continue to encourage him to increase oral intake as tolerated   Pancytopenia due to antineoplastic chemotherapy [facility]) This is likely due to recent treatment. The patient denies recent history of fevers,  cough, chills, diarrhea or dysuria. He is asymptomatic from the leukopenia or anemia. I will observe for now.  I will reduce the dose of treatment as above  Major depressive disorder, recurrent episode, moderate ([place])  He felt that his depression is not under control but he is not suicidal. He will continue medication as prescribed by recent psychiatrist I will make sure he gets a  follow-up appointment for medication adjustment  Orthostatic hypotension His blood pressure is low today could be related to recent nausea and vomiting He will continue on Florinef and are recommended increase oral intake as tolerated He has appointment to follow-up with cardiologist for possible medication adjustment at the end of the month.  Chronic pain He felt that pain is under control today. He had recent sedation which I believe could be related to his prescription of pain medicine. I recommend he discontinue long-acting morphine and use immediate release morphine as needed only for pain   Orders Placed This Encounter  Procedures  . CT Chest W Contrast    Standing Status: Future     Number of Occurrences:      Standing Expiration Date: 01/04/2017    Order Specific Question:  Reason for Exam (SYMPTOM  OR DIAGNOSIS REQUIRED)    Answer:  metastatic cancer to lung, assess response to Rx    Order Specific Question:  Preferred imaging location?    Answer:  [facility]   All questions were answered. The patient knows to call the clinic with any problems, questions or concerns. No barriers to learning was detected. I spent 25 minutes counseling the patient face to face. The total time spent in the appointment was 40 minutes and more than 50% was on counseling and review of test results     [facility], [place], MD 11/05/2015 2:46 PM

## 2015-11-05 NOTE — Assessment & Plan Note (Signed)
He is eating better since the last time I saw him  He is able to swallow most foods by mouth. I continue to encourage him to increase oral intake as tolerated

## 2015-11-05 NOTE — Telephone Encounter (Signed)
Gave and printd papt sched and avs for pt for March and April

## 2015-11-05 NOTE — Patient Instructions (Signed)

## 2015-11-05 NOTE — Assessment & Plan Note (Signed)
The patient had significant mucositis, nausea and vomiting with cycle 1 of treatment, subsequently resolved. I plan to reduce the dose of chemotherapy by 10% and we will repeat imaging study to assess response to treatment

## 2015-11-05 NOTE — Assessment & Plan Note (Signed)
His blood pressure is low today could be related to recent nausea and vomiting He will continue on Florinef and are recommended increase oral intake as tolerated He has appointment to follow-up with cardiologist for possible medication adjustment at the end of the month.

## 2015-11-05 NOTE — Assessment & Plan Note (Signed)
He felt that pain is under control today. He had recent sedation which I believe could be related to his prescription of pain medicine. I recommend he discontinue long-acting morphine and use immediate release morphine as needed only for pain

## 2015-11-05 NOTE — Assessment & Plan Note (Signed)
He felt that his depression is not under control but he is not suicidal. He will continue medication as prescribed by recent psychiatrist I will make sure he gets a follow-up appointment for medication adjustment

## 2015-11-05 NOTE — Patient Instructions (Signed)
[place] Discharge Instructions for Patients Receiving Chemotherapy  Today you received the following chemotherapy agents Carboplatin and Adrucil.  To help prevent nausea and vomiting after your treatment, we encourage you to take your nausea medication as prescribed.   If you develop nausea and vomiting that is not controlled by your nausea medication, call the clinic.   BELOW ARE SYMPTOMS THAT SHOULD BE REPORTED IMMEDIATELY:  *FEVER GREATER THAN 100.5 F  *CHILLS WITH OR WITHOUT FEVER  NAUSEA AND VOMITING THAT IS NOT CONTROLLED WITH YOUR NAUSEA MEDICATION  *UNUSUAL SHORTNESS OF BREATH  *UNUSUAL BRUISING OR BLEEDING  TENDERNESS IN MOUTH AND THROAT WITH OR WITHOUT PRESENCE OF ULCERS  *URINARY PROBLEMS  *BOWEL PROBLEMS  UNUSUAL RASH Items with * indicate a potential emergency and should be followed up as soon as possible.  Feel free to call the clinic you have any questions or concerns. The clinic phone number is [phone].  Please show the [place] at check-in to the Emergency Department and triage nurse.

## 2015-11-05 NOTE — Assessment & Plan Note (Signed)
This is likely due to recent treatment. The patient denies recent history of fevers, cough, chills, diarrhea or dysuria. He is asymptomatic from the leukopenia or anemia. I will observe for now.  I will reduce the dose of treatment as above

## 2015-11-09 ENCOUNTER — Ambulatory Visit (HOSPITAL_BASED_OUTPATIENT_CLINIC_OR_DEPARTMENT_OTHER): Payer: Medicaid Other

## 2015-11-09 VITALS — BP 92/53 | HR 85 | Temp 98.6°F | Resp 18

## 2015-11-09 DIAGNOSIS — C321 Malignant neoplasm of supraglottis: Secondary | ICD-10-CM

## 2015-11-09 DIAGNOSIS — Z452 Encounter for adjustment and management of vascular access device: Secondary | ICD-10-CM | POA: Diagnosis present

## 2015-11-09 MED ORDER — HEPARIN SOD (PORK) LOCK FLUSH 100 UNIT/ML IV SOLN
500.0000 [IU] | Freq: Once | INTRAVENOUS | Status: AC | PRN
  Administered 2015-11-09: 500 [IU]
  Filled 2015-11-09: qty 5

## 2015-11-09 MED ORDER — SODIUM CHLORIDE 0.9% FLUSH
10.0000 mL | INTRAVENOUS | Status: DC | PRN
  Administered 2015-11-09: 10 mL
  Filled 2015-11-09: qty 10

## 2015-11-09 NOTE — Patient Instructions (Signed)

## 2015-11-10 NOTE — Progress Notes (Signed)
  Oncology Nurse Navigator Documentation  Navigator Location: [facility]-Med Onc (11/05/15 1828) Navigator Encounter Type: Clinic/MDC (11/05/15 8337)           Patient Visit Type: MedOnc (11/05/15 4451) Treatment Phase: Active Tx (11/05/15 4604) Barriers/Navigation Needs: No Questions;No Needs (11/05/15 7998)       Met with Marvin [name] during Established Patient appt with Dr. Alvy Bimler.  He was accompanied by his [name].  He reported feeling well, eating and drinking at will. He has lost 2 lbs since last week, Dr. Alvy Bimler encouraged consumption of milkshakes, nutritional drinks.  He has not had a follow-up with his psychiatrist.  Marvin [name] indicated she will contact him for an appt.  He continues with current palliative chemotherapy regime.  A CT chest will be scheduled for 4/7 to gauge treatment efficacy. They understand I can be contacted with needs/concerns.  [name], RN, BSN, [place] at [place] [phone]                       Time Spent with Patient: 30 (11/05/15 0943)

## 2015-11-12 ENCOUNTER — Ambulatory Visit: Payer: Self-pay | Admitting: Hematology and Oncology

## 2015-11-15 ENCOUNTER — Telehealth: Payer: Self-pay | Admitting: Hematology and Oncology

## 2015-11-15 ENCOUNTER — Other Ambulatory Visit: Payer: Self-pay | Admitting: *Deleted

## 2015-11-15 DIAGNOSIS — F411 Generalized anxiety disorder: Secondary | ICD-10-CM

## 2015-11-15 MED ORDER — FLUOXETINE HCL 20 MG PO CAPS: 20.0000 mg | ORAL_CAPSULE | Freq: Every day | ORAL | Status: DC

## 2015-11-15 NOTE — Telephone Encounter (Signed)
s.w. pt and advised on lab/flush moved....pt ok and aware of new appts

## 2015-11-16 ENCOUNTER — Ambulatory Visit (INDEPENDENT_AMBULATORY_CARE_PROVIDER_SITE_OTHER): Payer: Medicaid Other | Admitting: Cardiovascular Disease

## 2015-11-16 ENCOUNTER — Encounter: Payer: Self-pay | Admitting: Cardiovascular Disease

## 2015-11-16 VITALS — BP 106/80 | HR 73 | Ht 73.0 in | Wt 138.8 lb

## 2015-11-16 DIAGNOSIS — I469 Cardiac arrest, cause unspecified: Secondary | ICD-10-CM

## 2015-11-16 DIAGNOSIS — I951 Orthostatic hypotension: Secondary | ICD-10-CM

## 2015-11-16 NOTE — Progress Notes (Signed)
Cardiology Office Note   Date:  11/16/2015   ID:  Marvin [name], DOB 01-31-1976, MRN [id number]  PCP:  No PCP Per Patient  Cardiologist:   Marvin Harness, MD   Chief Complaint  Patient presents with  . Follow-up    3 months  pt c/o occasional SOB, dizziness on exertion yesterday, no other Sx.      Patient ID: Marvin [name] is a 40 y.o. 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    Interval History 11/14/15: At his last appointment Florinef was increased to 0.2 mg daily. He was subsequently admitted to the hospital on January 19 with altered mental status and hypotension and alcohol intoxication. His symptoms improved with IV fluids. He was noted to have metastases on chest x-ray.  He was then readmitted on January 25 requesting detoxification for alcohol.  On February 13 he began chemotherapy with carboplatin, 5-FU and Cetuximab and developed cardiac arrest that was thought to be due to an anaphylactic reaction to Cetuximab.  His echo was unremarkable at the time.    Since then he has been feeling well.  He has mild dizziness but this has improved significantly.  He notices it especially if he is getting up and down a lot.  Otherwise he is well.  He denies chest pain or shortness of breath.  There is no lower extremity edema, orthopnea or PND.  He denies palpitations.  His appetite has been good.  He no longer requires IV fluids to keep his BP up.  He gets chemo every 3 weeks.   Interval History 08/16/15: After his last appointment Marvin [name] had an echo that was normal.  He was instructed to increase his G-tube flushes and start Florinef 0.1 mg daily.  Since that appointment Marvin. [name] has been doing well. He continues to get 1 L of normal saline on Tuesdays and Fridays.  However this is much improved, as he was getting 2 L of fluid  daily.  He reports eating 3 small meals daily. He also uses 3-4 cans of Jevity at night, but he does not always received these from his rehabilitation facility. He tries to also use boost and Ensure.  He denies chest pain, shortness of breath, lightheadedness, dizziness or palpitations. He has not been admitted to the hospital.  He is unsure of when he will be discharged from his rehabilitation facility.  History of Present Illness 07/16/15: Marvin. Bobette [name] received 3 cycles of high-dose Cisplatin from 03/21/15 through 05/04/15.  Following this therapy he has experienced persistent orthostatic hypotension.  He was noted to be hypotensive and tachycardic at his infusion center appointment on 11/22.  His blood pressure was 88/66 and his heart rate was 117 bpm.  His vitals improved with IV fluids, as they typically do.  Marvin [name] was referred to cardiology for management of orthostatic hypotension.  He has some oral intake as well as three cans of supplemental nutrition via g-tube daily.  He reports eating 3 meals of soft food daily.  He also reports drinking 4 large bottles of water daily.  His cortisol level has been checked and was within normal limits.  He also had his CBC checked, which did reveal mild anemia (Hct 32.3) but no leukocytosis or leukopenia.  Marvin [name] has suffered from stomatitis and his pain has been poorly-controlled.  He has been receiving both  IV and PO dilaudid.  He has also been receiving 2L of IV fluids daily for 3 weeks.  His trach was decannulated on 11/2.  This am he was dizzy prior to receiving his infusion of fluid.  He only received 1L of saline today. His dizziness has improved but he does not feel that his baseline.  Symptoms are typically worse in the morning and improve after receiving IV fluids. He denies chest pain, shortness of breath, lower extremity edema, orthopnea or PND.  Past Medical History  Diagnosis Date  . ETOH abuse   . Alcohol dependence ([place]) 04/17/2012  . COPD  (chronic obstructive pulmonary disease) ([place])   . Shortness of breath dyspnea     occ  . Anxiety   . GERD (gastroesophageal reflux disease)   . Seizures ([place])     one episode 09/2012, suspected related to ETOH withdrawal  . Anxiety disorder 03/12/2015  . Headache 05/10/2015  . Hypokalemia 05/10/2015  . 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 Cancer ([place])   . Throat cancer ([place])   . Severe major depression, single episode, without psychotic features ([place]) 09/13/2015    Past Surgical History  Procedure Laterality Date  . Knee surgery Left     kneecap -screws  . Appendectomy    . Tracheostomy tube placement N/A 02/22/2015    Procedure: TRACHEOSTOMY;  Surgeon: [name], MD;  Location: [facility];  Service: ENT;  Laterality: N/A;  awake tracheostomy  . Direct laryngoscopy N/A 02/22/2015    Procedure: DIRECT LARYNGOSCOPY;  Surgeon: [name], MD;  Location: [facility];  Service: ENT;  Laterality: N/A;  direct laryngoscopy with biopsy  . Multiple extractions with alveoloplasty N/A 03/05/2015    Procedure: Extraction of tooth #'s 1,2,8,9,15,16,17,18,31,32 with alveoloplasty, mandibular right lateral exostoses, and gross debridement of remaining teeth;  Surgeon: Lenn Cal, DDS;  Location: [facility];  Service: Oral Surgery;  Laterality: N/A;     Current Outpatient Prescriptions  Medication Sig Dispense Refill  . fludrocortisone (FLORINEF) 0.1 MG tablet Take 2 tablets (0.2 mg total) by mouth daily. 90 tablet 6  . FLUoxetine (PROZAC) 20 MG capsule Take 1 capsule (20 mg total) by mouth daily. 30 capsule 0  . LORazepam (ATIVAN) 0.5 MG tablet Take 1 tablet (0.5 mg total) by mouth 3 (three) times daily. 90 tablet 0  . magic mouthwash SOLN Take 10 mLs by mouth 4 (four) times daily as needed for mouth pain (Swish and swallow). 480 mL 1  . morphine (MS CONTIN) 30 MG 12 hr tablet Take 1 tablet (30 mg total) by mouth every 12 (twelve) hours. 60 tablet 0  . morphine (MSIR) 15 MG tablet Take 1 tablet (15 mg  total) by mouth every 4 (four) hours as needed for severe pain. 60 tablet 0  . morphine (MSIR) 30 MG tablet Take 1 tablet (30 mg total) by mouth every 4 (four) hours as needed. 90 tablet 0  . ondansetron (ZOFRAN) 8 MG tablet Take 1 tablet (8 mg total) by mouth every 8 (eight) hours as needed for nausea. 60 tablet 3  . potassium chloride SA (K-DUR,KLOR-CON) 20 MEQ tablet Take 1 tablet (20 mEq total) by mouth 2 (two) times daily. 14 tablet 0  . prochlorperazine (COMPAZINE) 10 MG tablet Take 1 tablet (10 mg total) by mouth every 6 (six) hours as needed. 60 tablet 3  . senna (SENOKOT) 8.6 MG TABS tablet Take 1 tablet (8.6 mg total) by mouth 2 (two) times daily. 60 each 0  .  traZODone (DESYREL) 100 MG tablet Take 1 tablet (100 mg total) by mouth at bedtime as needed for sleep. 30 tablet 3   No current facility-administered medications for this visit.   Facility-Administered Medications Ordered in Other Visits  Medication Dose Route Frequency Provider Last Rate Last Dose  . 0.9 %  sodium chloride infusion   Intravenous Once [name], MD      . sodium chloride flush (NS) 0.9 % injection 10 mL  10 mL Intravenous PRN [name], MD   10 mL at 10/01/15 [id number]    Allergies:   Cetuximab    Social History:  The patient  reports that he quit smoking about 9 months ago. His smoking use included Cigarettes. He has a 28 pack-year smoking history. He has never used smokeless tobacco. He reports that he does not drink alcohol or use illicit drugs.   Family History:  The patient's family history includes Cancer in his mother and paternal grandfather; Heart attack in his maternal grandfather and maternal grandmother.    ROS:  Please see the history of present illness.   Otherwise, review of systems are positive for none.   All other systems are reviewed and negative.    PHYSICAL EXAM: VS:  BP 106/80 mmHg  Pulse 73  Ht 6\' 1"  (1.854 m)  Wt 62.959 kg (138 lb 12.8 oz)  BMI 18.32 kg/m2 , BMI Body mass index is  18.32 kg/(m^2).   GENERAL: Chronically ill-appearing.  Thin.  HEENT:  Pupils equal round and reactive, fundi not visualized, oral mucosa unremarkable.  MM moist. NECK:  No jugular venous distention, waveform within normal limits, carotid upstroke brisk and symmetric, no bruits.  Healed trach stoma in suprasternal notch. LYMPHATICS:  No cervical adenopathy LUNGS:  Clear to auscultation bilaterally HEART:  RRR.  PMI not displaced or sustained,S1 and S2 within normal limits, no S3, no S4, no clicks, no rubs, no murmurs ABD:  Flat, positive bowel sounds normal in frequency in pitch, no bruits, no rebound, no guarding, no midline pulsatile mass, no hepatomegaly, no splenomegaly EXT:  2 plus pulses throughout, no edema, no cyanosis no clubbing SKIN:  No rashes no nodules NEURO:  Cranial nerves II through XII grossly intact, motor grossly intact throughout PSYCH:  Cognitively intact, oriented to person place and time  EKG:  EKG is not ordered today.  Echo 07/27/15: Study Conclusions  - Left ventricle: The cavity size was normal. Wall thickness was normal. Systolic function was normal. The estimated ejection fraction was in the range of 60% to 65%. Wall motion was normal; there were no regional wall motion abnormalities. Left ventricular diastolic function parameters were normal. - Right ventricle: The cavity size was normal. Wall thickness was normal. Recent Labs: 06/04/2015: TSH 0.529 10/15/2015: Magnesium 1.8 11/05/2015: ALT <9; BUN 12.7; Creatinine 0.7; HGB 11.0*; Platelets 143; Potassium 3.7; Sodium 141    Lipid Panel    Component Value Date/Time   CHOL 105 07/19/2015   TRIG 75 07/19/2015   HDL 34* 07/19/2015   LDLCALC 56 07/19/2015      Wt Readings from Last 3 Encounters:  11/16/15 62.959 kg (138 lb 12.8 oz)  11/05/15 61.916 kg (136 lb 8 oz)  10/29/15 62.732 kg (138 lb 4.8 oz)      ASSESSMENT AND PLAN:  # Hypotension: Symptoms are much improved on the higher  dose of florinef.  He no longer requires IV fluids to maintain his BP.  This is also probably due to his increased appetite.  We will not make any changes at this time.  Continue Florinef.  We will see him in 4 months to ensure that his BP is not getting too high.  # Cardiac arrest: Due to anaphylactic shock.  LVEF is normal and he has no cardiac symptoms at this time.  No further work up is indicated.    Current medicines are reviewed at length with the patient today.  The patient does not have concerns regarding medicines.  The following changes have been made:  none Labs/ tests ordered today include:   No orders of the defined types were placed in this encounter.     Disposition:   FU with Jakyri Brunkhorst C. Oval Linsey, MD in 4 months.    Signed, Marvin Harness, MD  11/16/2015 2:08 PM    [place]

## 2015-11-16 NOTE — Patient Instructions (Signed)
Medication Instructions:  Your physician recommends that you continue on your current medications as directed. Please refer to the Current Medication list given to you today.  Labwork: none  Testing/Procedures: none  Follow-Up: Your physician wants you to follow-up in: 4 month ov You will receive a reminder letter in the mail two months in advance. If you don't receive a letter, please call our office to schedule the follow-up appointment.  If you need a refill on your cardiac medications before your next appointment, please call your pharmacy.  

## 2015-11-23 ENCOUNTER — Ambulatory Visit (HOSPITAL_COMMUNITY)
Admission: RE | Admit: 2015-11-23 | Discharge: 2015-11-23 | Disposition: A | Discharge status: Home / Self Care | Payer: Medicaid Other | Source: Ambulatory Visit | Attending: Hematology and Oncology | Admitting: Hematology and Oncology

## 2015-11-23 ENCOUNTER — Telehealth: Payer: Self-pay | Admitting: Hematology and Oncology

## 2015-11-23 ENCOUNTER — Other Ambulatory Visit (HOSPITAL_BASED_OUTPATIENT_CLINIC_OR_DEPARTMENT_OTHER): Payer: Medicaid Other

## 2015-11-23 ENCOUNTER — Ambulatory Visit: Payer: Medicaid Other

## 2015-11-23 ENCOUNTER — Encounter: Payer: Self-pay | Admitting: *Deleted

## 2015-11-23 ENCOUNTER — Ambulatory Visit (HOSPITAL_BASED_OUTPATIENT_CLINIC_OR_DEPARTMENT_OTHER): Payer: Medicaid Other

## 2015-11-23 ENCOUNTER — Ambulatory Visit (HOSPITAL_BASED_OUTPATIENT_CLINIC_OR_DEPARTMENT_OTHER): Payer: Medicaid Other | Admitting: Hematology and Oncology

## 2015-11-23 ENCOUNTER — Telehealth: Payer: Self-pay | Admitting: *Deleted

## 2015-11-23 ENCOUNTER — Other Ambulatory Visit: Payer: Self-pay | Admitting: Hematology and Oncology

## 2015-11-23 ENCOUNTER — Encounter: Payer: Self-pay | Admitting: Hematology and Oncology

## 2015-11-23 VITALS — BP 135/84 | HR 50 | Temp 97.8°F | Resp 20

## 2015-11-23 VITALS — BP 101/66 | HR 57 | Temp 98.6°F | Resp 18 | Wt 139.6 lb

## 2015-11-23 VITALS — BP 109/61 | HR 74 | Temp 98.4°F | Resp 16

## 2015-11-23 DIAGNOSIS — D61810 Antineoplastic chemotherapy induced pancytopenia: Secondary | ICD-10-CM

## 2015-11-23 DIAGNOSIS — R918 Other nonspecific abnormal finding of lung field: Secondary | ICD-10-CM | POA: Insufficient documentation

## 2015-11-23 DIAGNOSIS — Z9221 Personal history of antineoplastic chemotherapy: Secondary | ICD-10-CM | POA: Diagnosis not present

## 2015-11-23 DIAGNOSIS — D649 Anemia, unspecified: Secondary | ICD-10-CM | POA: Insufficient documentation

## 2015-11-23 DIAGNOSIS — C7801 Secondary malignant neoplasm of right lung: Secondary | ICD-10-CM | POA: Diagnosis not present

## 2015-11-23 DIAGNOSIS — J438 Other emphysema: Secondary | ICD-10-CM | POA: Insufficient documentation

## 2015-11-23 DIAGNOSIS — C321 Malignant neoplasm of supraglottis: Secondary | ICD-10-CM

## 2015-11-23 DIAGNOSIS — T451X5A Adverse effect of antineoplastic and immunosuppressive drugs, initial encounter: Principal | ICD-10-CM

## 2015-11-23 DIAGNOSIS — I2510 Atherosclerotic heart disease of native coronary artery without angina pectoris: Secondary | ICD-10-CM | POA: Diagnosis not present

## 2015-11-23 DIAGNOSIS — R5383 Other fatigue: Secondary | ICD-10-CM | POA: Diagnosis not present

## 2015-11-23 DIAGNOSIS — E43 Unspecified severe protein-calorie malnutrition: Secondary | ICD-10-CM

## 2015-11-23 DIAGNOSIS — Z95828 Presence of other vascular implants and grafts: Secondary | ICD-10-CM

## 2015-11-23 HISTORY — DX: Other fatigue: R53.83

## 2015-11-23 LAB — CBC WITH DIFFERENTIAL/PLATELET
BASO%: 0.2 % (ref 0.0–2.0)
BASOS ABS: 0 10*3/uL (ref 0.0–0.1)
EOS%: 0.2 % (ref 0.0–7.0)
Eosinophils Absolute: 0 10*3/uL (ref 0.0–0.5)
HEMATOCRIT: 23.9 % — AB (ref 38.4–49.9)
HGB: 7.9 g/dL — ABNORMAL LOW (ref 13.0–17.1)
LYMPH#: 0.3 10*3/uL — AB (ref 0.9–3.3)
LYMPH%: 14.7 % (ref 14.0–49.0)
MCH: 31.2 pg (ref 27.2–33.4)
MCHC: 33 g/dL (ref 32.0–36.0)
MCV: 94.5 fL (ref 79.3–98.0)
MONO#: 0.4 10*3/uL (ref 0.1–0.9)
MONO%: 20 % — AB (ref 0.0–14.0)
NEUT#: 1.3 10*3/uL — ABNORMAL LOW (ref 1.5–6.5)
NEUT%: 64.9 % (ref 39.0–75.0)
Platelets: 47 10*3/uL — ABNORMAL LOW (ref 140–400)
RBC: 2.53 10*6/uL — ABNORMAL LOW (ref 4.20–5.82)
RDW: 14.5 % (ref 11.0–14.6)
WBC: 2 10*3/uL — ABNORMAL LOW (ref 4.0–10.3)

## 2015-11-23 LAB — COMPREHENSIVE METABOLIC PANEL
ALBUMIN: 3 g/dL — AB (ref 3.5–5.0)
ALK PHOS: 93 U/L (ref 40–150)
ALT: <9 U/L (ref 0–55)
AST: 15 U/L (ref 5–34)
Anion Gap: 8 mEq/L (ref 3–11)
BUN: 10.2 mg/dL (ref 7.0–26.0)
CO2: 29 mEq/L (ref 22–29)
Calcium: 8.4 mg/dL (ref 8.4–10.4)
Chloride: 101 mEq/L (ref 98–109)
Creatinine: 0.7 mg/dL (ref 0.7–1.3)
EGFR: >90 mL/min/{1.73_m2} (ref >90)
GLUCOSE: 112 mg/dL (ref 70–140)
SODIUM: 139 meq/L (ref 136–145)
TOTAL PROTEIN: 5.7 g/dL — AB (ref 6.4–8.3)
Total Bilirubin: 0.6 mg/dL (ref 0.20–1.20)

## 2015-11-23 LAB — PREPARE RBC (CROSSMATCH)

## 2015-11-23 MED ORDER — SODIUM CHLORIDE 0.9 % IJ SOLN
10.0000 mL | INTRAMUSCULAR | Status: DC | PRN
  Filled 2015-11-23: qty 10

## 2015-11-23 MED ORDER — HEPARIN SOD (PORK) LOCK FLUSH 100 UNIT/ML IV SOLN
500.0000 [IU] | Freq: Once | INTRAVENOUS | Status: DC | PRN
  Filled 2015-11-23: qty 5

## 2015-11-23 MED ORDER — SODIUM CHLORIDE 0.9 % IV SOLN
250.0000 mL | Freq: Once | INTRAVENOUS | Status: AC
  Administered 2015-11-23: 250 mL via INTRAVENOUS

## 2015-11-23 MED ORDER — SODIUM CHLORIDE 0.9% FLUSH
10.0000 mL | INTRAVENOUS | Status: DC | PRN
  Administered 2015-11-23: 10 mL via INTRAVENOUS
  Filled 2015-11-23: qty 10

## 2015-11-23 MED ORDER — DIPHENHYDRAMINE HCL 25 MG PO CAPS
ORAL_CAPSULE | ORAL | Status: AC
  Filled 2015-11-23: qty 1

## 2015-11-23 MED ORDER — IOPAMIDOL (ISOVUE-300) INJECTION 61%
75.0000 mL | Freq: Once | INTRAVENOUS | Status: AC | PRN
  Administered 2015-11-23: 75 mL via INTRAVENOUS

## 2015-11-23 MED ORDER — ACETAMINOPHEN 325 MG PO TABS
650.0000 mg | ORAL_TABLET | Freq: Once | ORAL | Status: AC
  Administered 2015-11-23: 650 mg via ORAL

## 2015-11-23 MED ORDER — DIPHENHYDRAMINE HCL 25 MG PO CAPS
25.0000 mg | ORAL_CAPSULE | Freq: Once | ORAL | Status: AC
  Administered 2015-11-23: 25 mg via ORAL

## 2015-11-23 MED ORDER — HEPARIN SOD (PORK) LOCK FLUSH 100 UNIT/ML IV SOLN
500.0000 [IU] | Freq: Every day | INTRAVENOUS | Status: AC | PRN
  Administered 2015-11-23: 500 [IU]
  Filled 2015-11-23: qty 5

## 2015-11-23 MED ORDER — SODIUM CHLORIDE 0.9 % IV SOLN
Freq: Once | INTRAVENOUS | Status: AC
  Administered 2015-11-23: 13:00:00 via INTRAVENOUS
  Filled 2015-11-23: qty 4

## 2015-11-23 MED ORDER — HYDROMORPHONE HCL 4 MG/ML IJ SOLN
INTRAMUSCULAR | Status: AC
  Filled 2015-11-23: qty 1

## 2015-11-23 MED ORDER — SODIUM CHLORIDE 0.9% FLUSH
10.0000 mL | INTRAVENOUS | Status: AC | PRN
  Administered 2015-11-23: 10 mL
  Filled 2015-11-23: qty 10

## 2015-11-23 MED ORDER — HYDROMORPHONE HCL 4 MG/ML IJ SOLN
2.0000 mg | INTRAMUSCULAR | Status: DC | PRN
  Administered 2015-11-23 (×2): 2 mg via INTRAVENOUS

## 2015-11-23 MED ORDER — POTASSIUM CHLORIDE CRYS ER 20 MEQ PO TBCR: 20.0000 meq | EXTENDED_RELEASE_TABLET | Freq: Two times a day (BID) | ORAL | Status: DC

## 2015-11-23 MED ORDER — ACETAMINOPHEN 325 MG PO TABS
ORAL_TABLET | ORAL | Status: AC
  Filled 2015-11-23: qty 2

## 2015-11-23 NOTE — Patient Instructions (Signed)
Pembrolizumab injection What is this medicine? PEMBROLIZUMAB (pem broe liz ue mab) is a monoclonal antibody. It is used to treat melanoma and non-small cell lung cancer. This medicine may be used for other purposes; ask your health care provider or pharmacist if you have questions. What should I tell my health care provider before I take this medicine? They need to know if you have any of these conditions: -diabetes -immune system problems -inflammatory bowel disease -liver disease -lung or breathing disease -lupus -an unusual or allergic reaction to pembrolizumab, other medicines, foods, dyes, or preservatives -pregnant or trying to get pregnant -breast-feeding How should I use this medicine? This medicine is for infusion into a vein. It is given by a health care professional in a hospital or clinic setting. A special MedGuide will be given to you before each treatment. Be sure to read this information carefully each time. Talk to your pediatrician regarding the use of this medicine in children. Special care may be needed. Overdosage: If you think you have taken too much of this medicine contact a poison control center or emergency room at once. NOTE: This medicine is only for you. Do not share this medicine with others. What if I miss a dose? It is important not to miss your dose. Call your doctor or health care professional if you are unable to keep an appointment. What may interact with this medicine? Interactions have not been studied. Give your health care provider a list of all the medicines, herbs, non-prescription drugs, or dietary supplements you use. Also tell them if you smoke, drink alcohol, or use illegal drugs. Some items may interact with your medicine. This list may not describe all possible interactions. Give your health care provider a list of all the medicines, herbs, non-prescription drugs, or dietary supplements you use. Also tell them if you smoke, drink alcohol, or  use illegal drugs. Some items may interact with your medicine. What should I watch for while using this medicine? Your condition will be monitored carefully while you are receiving this medicine. You may need blood work done while you are taking this medicine. Do not become pregnant while taking this medicine or for 4 months after stopping it. Women should inform their doctor if they wish to become pregnant or think they might be pregnant. There is a potential for serious side effects to an unborn child. Talk to your health care professional or pharmacist for more information. Do not breast-feed an infant while taking this medicine or for 4 months after the last dose. What side effects may I notice from receiving this medicine? Side effects that you should report to your doctor or health care professional as soon as possible: -allergic reactions like skin rash, itching or hives, swelling of the face, lips, or tongue -bloody or black, tarry stools -breathing problems -change in the amount of urine -changes in vision -chest pain -chills -dark urine -dizziness or feeling faint or lightheaded -fast or irregular heartbeat -fever -flushing -hair loss -muscle pain -muscle weakness -persistent headache -signs and symptoms of high blood sugar such as dizziness; dry mouth; dry skin; fruity breath; nausea; stomach pain; increased hunger or thirst; increased urination -signs and symptoms of liver injury like dark urine, light-colored stools, loss of appetite, nausea, right upper belly pain, yellowing of the eyes or skin -stomach pain -weight loss Side effects that usually do not require medical attention (Report these to your doctor or health care professional if they continue or are bothersome.):constipation -cough -diarrhea -joint pain -  tiredness This list may not describe all possible side effects. Call your doctor for medical advice about side effects. You may report side effects to FDA at  1-800-FDA-1088. Where should I keep my medicine? This drug is given in a hospital or clinic and will not be stored at home. NOTE: This sheet is a summary. It may not cover all possible information. If you have questions about this medicine, talk to your doctor, pharmacist, or health care provider.    2016, [employer] Standard. (2014-10-03 17:24:19)  

## 2015-11-23 NOTE — Telephone Encounter (Signed)
lvm for pt regarding to April 19 appt

## 2015-11-23 NOTE — Progress Notes (Signed)
Pt added on for blood transfusion today after his CT scan today.  No Availability in Infusion room for transfusion so pt taken care of in Exam room by [facility] RN.  He c/o some nausea and also mid sternal pain.   States did not bring pain meds w/ him as he did not expect to be staying for transfusion today.   Nausea and pain meds given as ordered.

## 2015-11-23 NOTE — Progress Notes (Signed)
Patient in for labs, flush, and CT today. PAC accessed and flushed without difficulty. All labs obtained from [facility]. PAC left accessed and covered with a Tegaderm dressing for appointment with [facility] today. Call placed to [facility] Imaging department. Spoke with Tedra Coupe in [place], and made her aware that the patient's port-a-cath is accessed for his CT appointment. Tedra Coupe stated, "Thank you for letting us know. I will make the techs aware." Patient discharged from the Flush Room without any complaints.

## 2015-11-23 NOTE — Patient Instructions (Signed)

## 2015-11-23 NOTE — Patient Instructions (Signed)

## 2015-11-23 NOTE — Telephone Encounter (Signed)
Called pt to inform of low Hgb and need for transfusion today.  He had just left Radiology but still at the hospital and agreed to come over to [facility] for Transfusion.

## 2015-11-23 NOTE — Assessment & Plan Note (Signed)
This is due to recent treatment. He has mild fatigue. We discussed some of the risks, benefits, and alternatives of blood transfusions. The patient is symptomatic from anemia and the hemoglobin level is critically low.  Some of the side-effects to be expected including risks of transfusion reactions, chills, infection, syndrome of volume overload and risk of hospitalization from various reasons and the patient is willing to proceed and went ahead to sign consent today. We will proceed to give him 1 unit of blood transfusion. He would not be getting treatment next week. I plan to restart him on new regimen on 12/05/2015.

## 2015-11-23 NOTE — Assessment & Plan Note (Signed)
Unfortunately, CT scan showed disease progression. I will cancel his treatment next week. With the recent pancytopenia, he would not be ready for treatment next week. I plan to see him back on 12/05/2015 and switch his treatment to [facility]

## 2015-11-23 NOTE — Progress Notes (Signed)
[place] OFFICE PROGRESS NOTE  Patient Care Team: No Pcp Per Patient as PCP - General (General Practice) [name], MD as Consulting Physician (Otolaryngology) [name], MD as Attending Physician (Radiation Oncology) [name], MD as Consulting Physician (Hematology and Oncology) Leota Sauers, RN as Oncology Nurse Navigator Lenn Cal, DDS as Consulting Physician (Dentistry)  SUMMARY OF ONCOLOGIC HISTORY: Oncology History   Carcinoma of supraglottis   Staging form: Larynx - Supraglottis, AJCC 7th Edition     Clinical stage from 03/13/2015: Stage IVA (T3, N2b, M0) - Signed by [name], MD on 03/13/2015     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[place])   01/22/2015 Imaging CT scan showed enhancing infiltrative mass centered at the right piriform sinus with bilateral neck lymphadenopathy   02/22/2015 - 03/08/2015 Hospital Admission He was admitted to the hospital for management of advanced supraglottic cancer status post tracheostomy, biopsy, dental extraction, placement of feeding tube and subsequent discharge to skilled nursing facility   02/22/2015 Pathology Results Accession: [id number] 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   03/09/2015 PET scan 1)  Right piriform sinus mass with right level 2 nodal metastasis.  2)  Hypermetabolic thoracic nodes which are at least partially felt to be reactive.  3)  No evidence of subdiaphragmatic metastatic disease.    03/16/2015 Procedure He has placement of port   03/21/2015 - 05/04/2015 Chemotherapy He received high dose cisplatin x 3 cycles   03/21/2015 - 05/09/2015 Radiation Therapy Tomotherapy to supraglottic tumor and bilateral neck:  70 Gy in 35 fractions to gross disease, 63 Gy in 35 fractions to high risk nodal echelons, and 56 Gy in 35 fractions to intermediate risk nodal echelons.   05/09/2015 Miscellaneous He went  to the ED with fall   06/20/2015 Procedure Tracheostomy was removed   09/06/2015 - 09/08/2015 Hospital Admission  he was admitted to the hospital with alcohol intoxication, dehydration and pain. CT scan of the chest show bilateral metastatic disease to the lung   10/01/2015 Imaging  PET CT scan showed diffuse, multiple hypermetabolic activity in both lungs suspicious for metastatic cancer   10/01/2015 Adverse Reaction he received cetuximab, complicated by anaphylactic reaction   10/01/2015 - 10/03/2015 Hospital Admission He was admitted to the hospital, intubated for cardiac arrest   10/15/2015 - 11/23/2015 Chemotherapy He began palliative chemo with carboplatin and 5FU   10/29/2015 Adverse Reaction 5FU dose reduce by 10% due to nausea and vomiting   11/23/2015 Imaging Ct chest showed disease progression    INTERVAL HISTORY: Please see below for problem oriented charting. He tolerated treatment well. Denies pain. He complained of mild fatigue. No chest pain or shortness of breath. Unfortunately, CT scan showed disease progression He denies worsening depression or suicidal ideation. REVIEW OF SYSTEMS:   Constitutional: Denies fevers, chills or abnormal weight loss Eyes: Denies blurriness of vision Ears, nose, mouth, throat, and face: Denies mucositis or sore throat Respiratory: Denies cough, dyspnea or wheezes Cardiovascular: Denies palpitation, chest discomfort or lower extremity swelling Gastrointestinal:  Denies nausea, heartburn or change in bowel habits Skin: Denies abnormal skin rashes Lymphatics: Denies new lymphadenopathy or easy bruising Neurological:Denies numbness, tingling or new weaknesses Behavioral/Psych: Mood is stable, no new changes  All other systems were reviewed with the patient and are negative.  I have reviewed the past medical history, past surgical history, social history and family history with the patient and they  are unchanged from previous note.  ALLERGIES:  is allergic  to cetuximab.  MEDICATIONS:  Current Outpatient Prescriptions  Medication Sig Dispense Refill  . fludrocortisone (FLORINEF) 0.1 MG tablet Take 2 tablets (0.2 mg total) by mouth daily. 90 tablet 6  . FLUoxetine (PROZAC) 20 MG capsule Take 1 capsule (20 mg total) by mouth daily. 30 capsule 0  . LORazepam (ATIVAN) 0.5 MG tablet Take 1 tablet (0.5 mg total) by mouth 3 (three) times daily. 90 tablet 0  . magic mouthwash SOLN Take 10 mLs by mouth 4 (four) times daily as needed for mouth pain (Swish and swallow). 480 mL 1  . morphine (MS CONTIN) 30 MG 12 hr tablet Take 1 tablet (30 mg total) by mouth every 12 (twelve) hours. 60 tablet 0  . morphine (MSIR) 15 MG tablet Take 1 tablet (15 mg total) by mouth every 4 (four) hours as needed for severe pain. 60 tablet 0  . morphine (MSIR) 30 MG tablet Take 1 tablet (30 mg total) by mouth every 4 (four) hours as needed. 90 tablet 0  . ondansetron (ZOFRAN) 8 MG tablet Take 1 tablet (8 mg total) by mouth every 8 (eight) hours as needed for nausea. 60 tablet 3  . potassium chloride SA (K-DUR,KLOR-CON) 20 MEQ tablet Take 1 tablet (20 mEq total) by mouth 2 (two) times daily. 60 tablet 1  . prochlorperazine (COMPAZINE) 10 MG tablet Take 1 tablet (10 mg total) by mouth every 6 (six) hours as needed. 60 tablet 3  . senna (SENOKOT) 8.6 MG TABS tablet Take 1 tablet (8.6 mg total) by mouth 2 (two) times daily. 60 each 0  . traZODone (DESYREL) 100 MG tablet Take 1 tablet (100 mg total) by mouth at bedtime as needed for sleep. 30 tablet 3   No current facility-administered medications for this visit.   Facility-Administered Medications Ordered in Other Visits  Medication Dose Route Frequency Provider Last Rate Last Dose  . 0.9 %  sodium chloride infusion   Intravenous Once [name], MD      . heparin lock flush 100 unit/mL  500 Units Intracatheter Once PRN [name], MD      . HYDROmorphone (DILAUDID) injection 2 mg  2 mg Intravenous Q2H PRN [name], MD   2 mg at  11/23/15 1236  . sodium chloride 0.9 % injection 10 mL  10 mL Intracatheter PRN [name], MD      . sodium chloride flush (NS) 0.9 % injection 10 mL  10 mL Intravenous PRN [name], MD   10 mL at 10/01/15 0956    PHYSICAL EXAMINATION: ECOG PERFORMANCE STATUS: 1 - Symptomatic but completely ambulatory  Filed Vitals:   11/23/15 1316  BP: 101/66  Pulse: 57  Temp: 98.6 F (37 C)  Resp: 18   Filed Weights   11/23/15 1316  Weight: 139 lb 9.6 oz (63.322 kg)    GENERAL:alert, no distress and comfortable SKIN: skin color, texture, turgor are normal, no rashes or significant lesions EYES: normal, Conjunctiva are pink and non-injected, sclera clear OROPHARYNX:no exudate, no erythema and lips, buccal mucosa, and tongue normal  NECK: supple, thyroid normal size, non-tender, without nodularity LYMPH:  no palpable lymphadenopathy in the cervical, axillary or inguinal LUNGS: clear to auscultation and percussion with normal breathing effort HEART: regular rate & rhythm and no murmurs and no lower extremity edema ABDOMEN:abdomen soft, non-tender and normal bowel sounds Musculoskeletal:no cyanosis of digits and no clubbing  NEURO: alert & oriented x 3 with  fluent speech, no focal motor/sensory deficits  LABORATORY DATA:  I have reviewed the data as listed    Component Value Date/Time   NA 139 11/23/2015 1033   NA 143 10/03/2015 0630   NA 140 07/19/2015   K 3.0 Repeated and Verified* 11/23/2015 1033   K 3.6 10/03/2015 0630   CL 108 10/03/2015 0630   CO2 29 11/23/2015 1033   CO2 27 10/03/2015 0630   GLUCOSE 112 11/23/2015 1033   GLUCOSE 156* 10/03/2015 0630   BUN 10.2 11/23/2015 1033   BUN 11 10/03/2015 0630   BUN 10 07/19/2015   CREATININE 0.7 11/23/2015 1033   CREATININE 0.54* 10/03/2015 0630   CREATININE 0.65 08/16/2015 1538   CREATININE 0.6 07/19/2015   CALCIUM 8.4 11/23/2015 1033   CALCIUM 8.7* 10/03/2015 0630   PROT 5.7* 11/23/2015 1033   PROT 5.7* 10/01/2015 1331    ALBUMIN 3.0* 11/23/2015 1033   ALBUMIN 3.1* 10/01/2015 1331   AST 15 11/23/2015 1033   AST 26 10/01/2015 1331   ALT <9 11/23/2015 1033   ALT 13* 10/01/2015 1331   ALKPHOS 93 11/23/2015 1033   ALKPHOS 109 10/01/2015 1331   BILITOT 0.60 11/23/2015 1033   BILITOT 0.5 10/01/2015 1331   GFRNONAA >60 10/03/2015 0630   GFRAA >60 10/03/2015 0630    No results found for: SPEP, UPEP  Lab Results  Component Value Date   WBC 2.0* 11/23/2015   NEUTROABS 1.3* 11/23/2015   HGB 7.9* 11/23/2015   HCT 23.9* 11/23/2015   MCV 94.5 11/23/2015   PLT 47* 11/23/2015      Chemistry      Component Value Date/Time   NA 139 11/23/2015 1033   NA 143 10/03/2015 0630   NA 140 07/19/2015   K 3.0 Repeated and Verified* 11/23/2015 1033   K 3.6 10/03/2015 0630   CL 108 10/03/2015 0630   CO2 29 11/23/2015 1033   CO2 27 10/03/2015 0630   BUN 10.2 11/23/2015 1033   BUN 11 10/03/2015 0630   BUN 10 07/19/2015   CREATININE 0.7 11/23/2015 1033   CREATININE 0.54* 10/03/2015 0630   CREATININE 0.65 08/16/2015 1538   CREATININE 0.6 07/19/2015   GLU 82 07/19/2015      Component Value Date/Time   CALCIUM 8.4 11/23/2015 1033   CALCIUM 8.7* 10/03/2015 0630   ALKPHOS 93 11/23/2015 1033   ALKPHOS 109 10/01/2015 1331   AST 15 11/23/2015 1033   AST 26 10/01/2015 1331   ALT <9 11/23/2015 1033   ALT 13* 10/01/2015 1331   BILITOT 0.60 11/23/2015 1033   BILITOT 0.5 10/01/2015 1331       RADIOGRAPHIC STUDIES: I have personally reviewed the radiological images as listed and agreed with the findings in the report. Ct Chest W Contrast  11/23/2015  CLINICAL DATA:  Restaging supraglottic cancer, chemotherapy ongoing EXAM: CT CHEST WITH CONTRAST TECHNIQUE: Multidetector CT imaging of the chest was performed during intravenous contrast administration. CONTRAST:  74mL ISOVUE-300 IOPAMIDOL (ISOVUE-300) INJECTION 61% COMPARISON:  PET-CT dated 10/01/2015 FINDINGS: Mediastinum/Nodes: The heart is normal in size. No  pericardial effusion. Mild coronary atherosclerosis in the LAD. Small mediastinal lymph nodes, including an 11 mm short axis subcarinal node (series 2/ image 48). Right chest port terminates at the cavoatrial junction. Visualized thyroid is unremarkable. Lungs/Pleura: Dominant 14 x 12 mm nodule in the medial right upper lobe (series 5/image 33), previously 12 x 12 mm, suspicious for metastasis. Additional multifocal bilateral pulmonary nodules, mildly progressive, including: --9 x 6 mm nodule in  the lateral left upper lobe (series 5/image 26), previously 7 x 7 mm --12 x 7 mm irregular nodule in the anterior right upper lobe (series 5/image 27), new --10 x 8 mm nodule in the lateral right upper lobe (series 5/image 53), previously 7 x 7 mm --6 mm nodule in the posterior right lower lobe (series 5/image 73), unchanged Additional scattered subcentimeter nodules bilaterally (for example, series 5/images 43, 59, 60, and 70). Interstitial/perihilar branching opacities in the central right upper lobe (series 5/image 57), left upper lobe (series 5/image 64), right middle lobe (series 5/image 68), and to a lesser extent the right lower lobe (series 5/image 63), possibly infectious, tumor not excluded. Mild paraseptal emphysematous changes, upper lobe predominant. Biapical pleural-parenchymal scarring. No focal consolidation. No pleural effusion or pneumothorax. Upper abdomen: Visualized upper abdomen is within normal limits. Musculoskeletal: Visualized osseous structures are within normal limits. IMPRESSION: Mild progression of bilateral pulmonary nodules/metastases, including a dominant 14 x 12 mm nodule in the medial right upper lobe, as above. Additional interstitial/ground-glass opacities in the bilateral upper lobes and right middle lobe, possibly infectious, tumor not excluded. Electronically Signed   By: Julian Hy M.D.   On: 11/23/2015 12:44     ASSESSMENT & PLAN:  Carcinoma of supraglottis  ([place]) Unfortunately, CT scan showed disease progression. I will cancel his treatment next week. With the recent pancytopenia, he would not be ready for treatment next week. I plan to see him back on 12/05/2015 and switch his treatment to Pembrolizumab  Pancytopenia due to antineoplastic chemotherapy [facility]) This is due to recent treatment. He has mild fatigue. We discussed some of the risks, benefits, and alternatives of blood transfusions. The patient is symptomatic from anemia and the hemoglobin level is critically low.  Some of the side-effects to be expected including risks of transfusion reactions, chills, infection, syndrome of volume overload and risk of hospitalization from various reasons and the patient is willing to proceed and went ahead to sign consent today. We will proceed to give him 1 unit of blood transfusion. He would not be getting treatment next week. I plan to restart him on new regimen on 12/05/2015.   Orders Placed This Encounter  Procedures  . CBC with Differential    Standing Status: Standing     Number of Occurrences: 20     Standing Expiration Date: 11/23/2016  . Comprehensive metabolic panel    Standing Status: Standing     Number of Occurrences: 20     Standing Expiration Date: 11/23/2016  . TSH    Standing Status: Future     Number of Occurrences:      Standing Expiration Date: 12/27/2016  . PHYSICIAN COMMUNICATION ORDER    Required labs: TSH with each cycle, CMET/CBC with each cycle.   All questions were answered. The patient knows to call the clinic with any problems, questions or concerns. No barriers to learning was detected. I spent 20 minutes counseling the patient face to face. The total time spent in the appointment was 25 minutes and more than 50% was on counseling and review of test results     [facility], Willman Cuny, MD 11/23/2015 1:32 PM

## 2015-11-23 NOTE — Progress Notes (Signed)
Notified pt of low Potassium level today 3.0.  He says he has not been taking Potassium consistently.  He usually takes once a day but not always twice daily and he needs a refill sent to [facility].  Instructed pt to take twice a day every day and refill sent to [facility].  He verbalized understanding.  Notified Dr. Alvy Bimler and she reinforced for pt to take potassium consistently twice a day.

## 2015-11-26 ENCOUNTER — Ambulatory Visit: Payer: Self-pay | Admitting: Hematology and Oncology

## 2015-11-26 ENCOUNTER — Other Ambulatory Visit: Payer: Self-pay | Admitting: Hematology and Oncology

## 2015-11-26 ENCOUNTER — Ambulatory Visit: Payer: Self-pay

## 2015-11-26 ENCOUNTER — Other Ambulatory Visit: Payer: Self-pay

## 2015-11-26 LAB — TYPE AND SCREEN
ABO/RH(D): O POS
Antibody Screen: POSITIVE
DAT, IGG: NEGATIVE
UNIT DIVISION: 0

## 2015-11-26 NOTE — Progress Notes (Signed)
  Oncology Nurse Navigator Documentation  Navigator Location: [facility]-Med Onc (11/23/15 1310) Navigator Encounter Type: Clinic/MDC (11/23/15 1310)           Patient Visit Type: MedOnc (11/23/15 1310) Treatment Phase: Active Tx (11/23/15 1310) Barriers/Navigation Needs: No barriers at this time;No Questions;No Needs (11/23/15 1310)       To provide support and encouragement, care continuity and to assess for needs, met with Marvin [name] during appt with Dr. Alvy Bimler.   He reports he has an out-patient psychiatric appt at [place] on 12/05/15 at 1500.   He voiced understanding of Dr. Calton Dach discussion of today's CT results, her recommendation of new treatment with Keytruda to start 12/05/15.  He understands this 1100 tmt will be proceeded by labs and an appt with Dr. Alvy Bimler.  He stated I had his permission to contact his aunt to provide her this update.  He did not express any needs or concerns at this time, I encouraged him to contact me if that changes, he verbalized understanding.  [name], RN, BSN, [place] at [place] [phone]                        Time Spent with Patient: 30 (11/23/15 1310)

## 2015-11-30 ENCOUNTER — Ambulatory Visit: Payer: Self-pay | Admitting: Hematology and Oncology

## 2015-12-05 ENCOUNTER — Telehealth: Payer: Self-pay | Admitting: *Deleted

## 2015-12-05 ENCOUNTER — Ambulatory Visit: Payer: Self-pay | Admitting: Hematology and Oncology

## 2015-12-05 ENCOUNTER — Ambulatory Visit: Payer: Self-pay

## 2015-12-05 ENCOUNTER — Encounter: Payer: Self-pay | Admitting: *Deleted

## 2015-12-05 ENCOUNTER — Telehealth: Payer: Self-pay | Admitting: Adult Health

## 2015-12-05 ENCOUNTER — Other Ambulatory Visit: Payer: Self-pay

## 2015-12-05 NOTE — Telephone Encounter (Signed)
  Oncology Nurse Navigator Documentation  Navigator Location: [facility]-Med Onc (12/05/15 1105)   Telephone: Jerilee Hoh Confirmation/Clarification (12/05/15 1105)       Returned call to Mr. Lamere aunt, Thayer Headings, in follow-up to VMM from her husband, patient's father, regarding today's appts.  She voiced understanding of today's appts for lab, Dr. Alvy Bimler and Infusion but indicated they have not been able to reach [facility] by phone to arrange his transportation.  I explained that if he can be reached and arrives within the next couple of hours, appts are being held.  She replied that she would go by his home, agreed to call me with an update on his status and interest in keeping today's appts.  I provided Dr. Alvy Bimler and Infusion this update.  [name], RN, BSN, [place] at [place] [phone]                                     Time Spent with Patient: 30 (12/05/15 1105)

## 2015-12-05 NOTE — Progress Notes (Signed)
[place] Clinical Social Work  Clinical Social Work received [employer] from Survivorship NP, Mike Craze, stating patient did not attend cancer center appointments today.  Renato Battles, NP, spoke with patient's aunt who indicated patient has been very depressed and family is unable to locate patient.  CSW attempted to reach patient, left voicemail to return call as soon as possible. CSW contacted patient's sister, Marvin [name], by phone.  Patient's sister reported patient has "been very down and depressed since he learned the chemo wasn't working".  She shared he often gets angry at her and she feels he "has no hope".  She spoke with patient yesterday evening and he stated he was spending the night at a friends house.  Patient's sister agreed to go over to patient's house and patient's friends house this afternoon to locate Marvin [name] and inform him the [facility] staff is concerned for him.  Patient's sister will contact CSW regarding patient's situation this afternoon/evening.  CSW and patient's sister, Marvin [name], discussed Marvin [name]'s medical and emotional health.  CSW validated Marvin [name]'s feelings and provided brief emotional support.  CSW will discuss patient's case with medical team.  Polo Riley, MSW, LCSW, OSW-C Clinical Social Worker [facility] [phone]

## 2015-12-05 NOTE — Telephone Encounter (Signed)
I received a call from [name], Mr. Luchsinger aunt.  She called me to ask if I may be able to help her get the patient's chemotherapy appts reschduled to sometime next week (he no showed this morning to see Dr. Alvy Bimler and for his first Keytruda infusion).    She went on to tell me that she has "not been able to find Juanda Crumble."  She tells me that she went to his house and he either was not home or would not come to the door.  She states that she went by a friend's house that he sometimes goes to, but he wasn't there either.  She states that "he is very depressed and scared about his new chemotherapy, Keytruda, because he is afraid he will have another reaction and have cardiac arrest again like he did before."   She states that he refused to go see the psychiatrist, which was arranged after his last appt with Dr. Alvy Bimler.  Ms. Grandville Silos said that after he found out that Lauren was not going to be able to see him all the time, he was not interested in seeing a psychiatrist.  I asked if she knew if [place] was still taking his Prozac and she said, "Well, I think he is; surely he would tell me if he wasn't taking his medications."    According to Ms. Grandville Silos, the patient was last seen last evening.  She tells me that the patient's sister went to [place]' friend's house last night and Jerrelle was there.  The friend told Ms. Grandville Silos that after his sister left, that Akhir was upset and also left the house and just started walking on the street in the rain.  She stated "that doesn't sound like Rani, so I don't know what really happened."   I provided support over the phone with active listening and validation of her concerns.    I very clearly recommended to Ms. Grandville Silos that she call the non-emergency number to her local police station and have officers go to [place]' house for a "wellness check."  They would break into his house, if he did not answer the door.  Ms. Grandville Silos states that she does not want  to do anything to scare Derex because she does not want him to think that the police are there to take him back to the psychiatric unit to be committed.  I reinforced the importance of ensuring his safety and well-being, especially since no one has seen or heard from him since last night.  I let her know that I was less concerned that he missed his appt today and more concerned about his well-being.  She stated that she did not want to call the police now (despite my clear recommendation to do so); she prefers to wait until this evening when her husband can go back to [place]' house tonight to check on him together.  She states that if he does not come to the door this evening, then she will call the police for help in finding him.  I encouraged her to please keep [place] updated with his condition and how we can best support them.    I have shared this update with both Dr. Alvy Bimler and [name], RN-H&N Navigator.    I will also forward this note to Polo Riley, LCSW who also knows this patient and has been involved in his care.   Mike Craze, NP [place] [phone]

## 2015-12-06 ENCOUNTER — Encounter: Payer: Self-pay | Admitting: *Deleted

## 2015-12-06 ENCOUNTER — Telehealth: Payer: Self-pay | Admitting: Adult Health

## 2015-12-06 NOTE — Telephone Encounter (Signed)
The patient's aunt, [name], called me to let me know that Mr. Sphar is safe; he was "pouting" and could not charge his phone.  He is ready to come back for chemo and Ms. Grandville Silos is calling to let us know and to have help getting his appts rescheduled.    I let her know that I would forward this message to [name], RN and Dr. Alvy Bimler, so they can begin making arrangements for rescheduling his appts for hopefully sometime next week.  She tells me that the best way to go about rescheduling the appts is to call her and then she can make the patient aware (because she will be the person driving him to his appts).    She stated that she could be reached at: [phone] or we could call her husband at: [phone].  She stated that we could try to reach [place] by phone, but his cell phone is going to run out of minutes tomorrow and his sister, Freida Busman, is supposed to be taking him to [employer] to get more minutes added to his phone.  Thayer Headings isn't sure if he has enough minutes on his phone to be able to speak with anyone today or not.    I told her we would be in touch when we got things rescheduled and thanked her for calling to let us know that [place] is okay.    I will forward this message to Dr. Alvy Bimler, Liliane Channel, and Polo Riley to make them aware.   Mike Craze, NP [place] [phone]

## 2015-12-06 NOTE — Progress Notes (Signed)
[place] Work  Clinical Social Work received voicemail from patient stating he was doing okay and to call back.  CSW attempted to call patient, call went straight to voicemail, CSW requested return call.   Polo Riley, MSW, LCSW, OSW-C Clinical Social Worker [facility] [phone]

## 2015-12-07 ENCOUNTER — Telehealth: Payer: Self-pay | Admitting: Hematology and Oncology

## 2015-12-07 ENCOUNTER — Telehealth: Payer: Self-pay | Admitting: *Deleted

## 2015-12-07 NOTE — Telephone Encounter (Signed)
Spoke with patient re appointment for 4/27 @ 8:45 am.

## 2015-12-07 NOTE — Telephone Encounter (Signed)
  Oncology Nurse Navigator Documentation  Navigator Location: [facility]-Med Onc (12/07/15 1345) Navigator Encounter Type: Telephone (12/07/15 1345) Telephone: Lahoma Crocker Call;Appt Confirmation/Clarification (12/07/15 1345)                 Interventions: Coordination of Care (12/07/15 1345)       Spoke with Juanda Crumble' sister, Freida Busman, to inform her of rescheduling of Wednesday's missed appts for next Thursday 4/27 9:30 labs and flush, 10:15 Dr. Alvy Bimler, followed by tmt in Infusion. (Unable to reach [facility] or his aunt at home or mobile.)  She voiced understanding.  I contacted Lab and Infusion to reschedule appts, issued POFs accordingly. (Per Maudie Mercury, flush was scheduled for 9:00, I notified Candy of adjustment.)  [name], RN, BSN, [place] at [place] [phone]                    Time Spent with Patient: 30 (12/07/15 1345)

## 2015-12-12 ENCOUNTER — Telehealth: Payer: Self-pay | Admitting: *Deleted

## 2015-12-12 NOTE — Telephone Encounter (Addendum)
  Oncology Nurse Navigator Documentation  Navigator Location: [facility] (12/12/15 1210) Navigator Encounter Type: Telephone (12/12/15 1210) Telephone: Patient Update;Outgoing Call (12/12/15 1210)     Returned VMM to Marvin [name] [name].  She reported she has been having difficulty reaching him over the past days but finally spoke with him last HS.  Marvin [name] indicated he had been drinking heavily over the weekend but has discontinued.  (She thought he sounded intoxicated over the phone.)  He stated he wants to keep his appts tomorrow and wants to proceed with the first round of Stella.  She noted that he did not keep a recent appt with a substance abuse counselor.  I expressed concern that this most recent relapse may jeopardize his moving forward with tomorrow's start of Keytruda and his ability to complete full sets of this tmt.  She voiced similar concerns.  I reviewed the sequence of tomorrow morning's appts beginning with an 8:45 AM lab, 8:30 arrival.  I stated he should bring all of his medications to facilitate a thorough evaluation, that family members involved in his care should be present.  She voiced understanding.  Marvin [name] indicated she plans to invite Marvin [name] to stay at her home tonight to insure his arrival tomorrow.  She noted she would call me in the morning if a problem arises.  I called Dr. Alvy [name] to provide this update.  Marvin Orem, RN, BSN, [place] at [place] [phone]                                       Time Spent with Patient: 30 (12/12/15 1210)

## 2015-12-13 ENCOUNTER — Telehealth: Payer: Self-pay | Admitting: Adult Health

## 2015-12-13 ENCOUNTER — Ambulatory Visit: Payer: Self-pay

## 2015-12-13 ENCOUNTER — Ambulatory Visit: Payer: Self-pay | Admitting: Hematology and Oncology

## 2015-12-13 ENCOUNTER — Other Ambulatory Visit: Payer: Self-pay

## 2015-12-13 ENCOUNTER — Encounter: Payer: Self-pay | Admitting: *Deleted

## 2015-12-13 NOTE — Telephone Encounter (Signed)
I received a call from Marvin [name], the patient's aunt, letting me know that she had seen Marvin [name] today and he looked a little better; "he hadn't been drinking all day so that was good."  She tells me that they were able to get him an appt at [facility] for his psychiatric needs, but they cannot see him until 01/04/16.  Marvin [name] wanted to know if myself or Dr. Alvy Bimler would feel comfortable refilling his Prozac until he can be seen.   I let Marvin [name] know that I did not feel comfortable refilling any medications for [name] because he has not been seen or evaluated in several weeks. I also reiterated Dr. Calton Dach previous recommendation that Marvin [name]' depression should be managed by a psychiatric specialist who can follow him and make medication adjustments more long-term than we are able to do.  I told her that with his history of being hospitalized for the depression that he truly needs some intensive psychiatric support (which he sounds like he will hopefully get at [facility]) and specialized, licensed psychiatric professionals to help him with his depression rather than the cancer center.   In reviewing my notes re: a letter I had previously mailed to [place] about different psych clinics available to him, Marvin [name] was on that list as a walk-in clinic.  I gave Marvin [name] the information for the clinic and encouraged her to have Marvin [name] just show up and wait to be seen as soon as he is able so that he can potentially be restarted on antidepressants as soon as possible.  I encouraged her to get him to the clinic early in the morning, as he may have to wait all day to be seen without an appt.  She stated "well, I guess this is what we're going to have to do and I'll make sure he gets there."  She voiced understanding and gratitude for our help in trying to get Marvin [name] the care he needs.  I encouraged her to call us with any other questions or concerns.   Mike Craze, NP [place] [phone]

## 2015-12-13 NOTE — Telephone Encounter (Signed)
I received a voicemail from [name], [place] aunt, stating that Marvin [name] could not come in for treatment today. She stated that he was dressed and ready to go, but he was crying and "really out of it."  She stated that he thinks his depression is worse; he has run out of his Prozac and has not had it "for quite some time"; he did not tell his family that he was out of the Prozac, according to [place]. He feels like he needs a higher dose and thinks this is why he has started drinking again.    I let [name], RN-H&N Navigator know about this voicemail I received and he plans to notify Dr. Alvy Bimler.  I will also forward this note to those involved in his care as well.    Mike Craze, NP [place] [phone]

## 2015-12-13 NOTE — Progress Notes (Signed)
  Oncology Nurse Navigator Documentation  Navigator Location: [facility] (12/13/15 1335) Navigator Encounter Type: Other (12/13/15 1335)     Met with Mr. Neis aunt Thayer Headings in [facility] lobby.  She indicated he informed her  his recent drinking binge was related to his depression that had worsened since he stopped taking his Prozac.  He apparently ran out of medication and did not inform any family member of this situation.  I stated that Dr. Alvy Bimler had made it clear that his Prozac Rx had to be managed by a mental health physician.    I expressed concern that he would not be able to manage the proposed [place] program if his depression and drinking could not be managed.  She voiced agreement.  I strongly recommended that Rex return to the mental health clinic at which he had previously scheduled an appt. She indicated she would do her best to see that this happened.  I asked that she provide me or Elzie Rings updates when available.  [name], RN, BSN, [place] at [place] [phone]                                         Time Spent with Patient: 30 (12/13/15 1335)

## 2015-12-18 ENCOUNTER — Telehealth: Payer: Self-pay | Admitting: *Deleted

## 2015-12-18 NOTE — Telephone Encounter (Addendum)
  Oncology Nurse Navigator Documentation  Navigator Location: [facility] (12/18/15 1743) Navigator Encounter Type: Telephone (12/18/15 1743) Telephone: Outgoing Call (12/18/15 [id number])         Patient Visit Type: Follow-up (12/18/15 1743)    Corlis Hove to check on his well-being.  He indicated he was feeling very anxious, drinking for relief.  He noted he has a 01/04/16 appt with his psychiatrist.  He expressed concern that he would not be able to proceed with treatment.  I assured him treatment is still available but that it is critical he stop drinking before he can proceed.  I emphasized the importance of keeping his mental health appt so he can receive the necessary support and guidance to adjust his Prozac which will help with his dependency on alcohol for relief of his anxiety.  He voice understanding and agreement.  I encouraged him to try to arrange an earlier appt to see his psychiatrist.  He indicated he would try.  I asked him to keep me posted on this appt, call me with concerns.  He stated he would do so.  [name], RN, BSN, [place] at [place] [phone]                               Time Spent with Patient: 15 (12/18/15 1743)

## 2015-12-20 ENCOUNTER — Telehealth: Payer: Self-pay | Admitting: Adult Health

## 2015-12-20 NOTE — Telephone Encounter (Signed)
I received a call from Marvin [name] letting me know that he went to a clinic and "they don't know what to do for me."  He tells me that he has been out of his antidepressant medication for about 1 week.  He states that this clinic told him to call the cancer center about his depression.    With further probing, he states he went to "[facility]" and not the [facility] that I had encouraged his aunt, Marvin [name], to take him to.  [facility] helps treat behavioral health and provides medications.  As previously reviewed with the patient and his aunt, I encouraged Marvin [name] to show up to [facility] first thing in the morning, by 8 or 8:30 am, in order to wait to be seen for the walk-in portion of the clinic for patients without an appt. He tells me that he has an appt at [facility] on 01/04/16, but doesn't want to wait that long. I agree that he should not wait and encouraged him to be seen at the walk-in clinic as soon as he can.   Of note, his speech was slurred and pressured during our conversation.  He endorses that he is continuing to drink "not too much, just enough to knock down my anxiety."   He denies any illicit drug use.  He stated, "I just feel like I'm running out of time and I'm going to die from this cancer."  I let Marvin [name] know that we are rooting for him to get well with his drinking and his mental health and our first priority is his safety.  We would not want to treat his cancer when he is drinking or so depressed because of possible complications.  He understands but is afraid.  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  I let him know that we would love to give him treatment for his cancer, but cannot do so in his current condition with his drinking and severe depression.  He again states that he understands and expresses extreme gratitude and love for the staff at the cancer center who have helped him thus far.   I explicitly asked him if he was having any thoughts of hurting himself, again helping him understand that his safety is very important. He denies any suicidal ideations.    I will forward this message to [name]'s care team to provide them with this update.  I told Marvin [name] to call me back if he has trouble at [facility] and we would try our best to work with him to get him the psychiatric care he so desperately needs.  He voiced understanding and appreciation.    Mike Craze, NP [place] [phone]

## 2015-12-21 ENCOUNTER — Ambulatory Visit: Payer: Self-pay | Admitting: Hematology and Oncology

## 2015-12-21 ENCOUNTER — Telehealth: Payer: Self-pay | Admitting: Adult Health

## 2015-12-21 NOTE — Telephone Encounter (Signed)
I received a voicemail from [name] letting me know that he went to [facility] today and there was not a doctor available to see him when he went to the walk-in clinic today.  He tells me that he got there at about 3 pm in the afternoon.    I reminded [place] that he needs to try to go back on Monday in the morning, right when they open, and there may be a better chance of him getting seen by somebody.  I also reminded him that it really needs to be a mental health specialist who evaluates him and provides his prescriptions, as he may need a new medicine or different dosages since the previous dose of Prozac was reportedly ineffective.  He voiced understanding and states he will try to go back to [facility] on Monday morning.  I told him to call me if he has any other questions or concerns.  HE agreed.   Mike Craze, NP [facility] [phone]

## 2015-12-21 NOTE — Telephone Encounter (Signed)
Thanks for the update

## 2015-12-24 ENCOUNTER — Emergency Department (HOSPITAL_COMMUNITY)
Admission: EM | Admit: 2015-12-24 | Discharge: 2015-12-24 | Disposition: A | Discharge status: Home / Self Care | Payer: Medicaid Other | Attending: Emergency Medicine | Admitting: Emergency Medicine

## 2015-12-24 ENCOUNTER — Telehealth: Payer: Self-pay | Admitting: Adult Health

## 2015-12-24 ENCOUNTER — Encounter (HOSPITAL_COMMUNITY): Payer: Self-pay

## 2015-12-24 DIAGNOSIS — J449 Chronic obstructive pulmonary disease, unspecified: Secondary | ICD-10-CM | POA: Diagnosis not present

## 2015-12-24 DIAGNOSIS — Z79899 Other long term (current) drug therapy: Secondary | ICD-10-CM | POA: Insufficient documentation

## 2015-12-24 DIAGNOSIS — Z87891 Personal history of nicotine dependence: Secondary | ICD-10-CM | POA: Insufficient documentation

## 2015-12-24 DIAGNOSIS — F322 Major depressive disorder, single episode, severe without psychotic features: Secondary | ICD-10-CM | POA: Insufficient documentation

## 2015-12-24 DIAGNOSIS — Z7952 Long term (current) use of systemic steroids: Secondary | ICD-10-CM | POA: Insufficient documentation

## 2015-12-24 DIAGNOSIS — F10929 Alcohol use, unspecified with intoxication, unspecified: Secondary | ICD-10-CM

## 2015-12-24 DIAGNOSIS — F10229 Alcohol dependence with intoxication, unspecified: Secondary | ICD-10-CM | POA: Insufficient documentation

## 2015-12-24 DIAGNOSIS — F419 Anxiety disorder, unspecified: Secondary | ICD-10-CM | POA: Diagnosis not present

## 2015-12-24 DIAGNOSIS — R21 Rash and other nonspecific skin eruption: Secondary | ICD-10-CM | POA: Diagnosis present

## 2015-12-24 DIAGNOSIS — Z862 Personal history of diseases of the blood and blood-forming organs and certain disorders involving the immune mechanism: Secondary | ICD-10-CM | POA: Diagnosis not present

## 2015-12-24 DIAGNOSIS — E876 Hypokalemia: Secondary | ICD-10-CM | POA: Insufficient documentation

## 2015-12-24 DIAGNOSIS — Z8719 Personal history of other diseases of the digestive system: Secondary | ICD-10-CM | POA: Diagnosis not present

## 2015-12-24 DIAGNOSIS — F1020 Alcohol dependence, uncomplicated: Secondary | ICD-10-CM

## 2015-12-24 DIAGNOSIS — Z85818 Personal history of malignant neoplasm of other sites of lip, oral cavity, and pharynx: Secondary | ICD-10-CM | POA: Insufficient documentation

## 2015-12-24 LAB — CBC WITH DIFFERENTIAL/PLATELET
BASOS ABS: 0 10*3/uL (ref 0.0–0.1)
Basophils Relative: 1 %
EOS ABS: 0 10*3/uL (ref 0.0–0.7)
Eosinophils Relative: 0 %
HEMATOCRIT: 33.7 % — AB (ref 39.0–52.0)
Hemoglobin: 11.3 g/dL — ABNORMAL LOW (ref 13.0–17.0)
LYMPHS ABS: 0.6 10*3/uL — AB (ref 0.7–4.0)
Lymphocytes Relative: 26 %
MCH: 32.8 pg (ref 26.0–34.0)
MCHC: 33.5 g/dL (ref 30.0–36.0)
MCV: 97.7 fL (ref 78.0–100.0)
MONO ABS: 0.2 10*3/uL (ref 0.1–1.0)
Monocytes Relative: 11 %
NEUTROS ABS: 1.4 10*3/uL — AB (ref 1.7–7.7)
Neutrophils Relative %: 62 %
PLATELETS: 67 10*3/uL — AB (ref 150–400)
RBC: 3.45 MIL/uL — ABNORMAL LOW (ref 4.22–5.81)
RDW: 20.6 % — AB (ref 11.5–15.5)
WBC: 2.2 10*3/uL — ABNORMAL LOW (ref 4.0–10.5)

## 2015-12-24 LAB — URINALYSIS, ROUTINE W REFLEX MICROSCOPIC
BILIRUBIN URINE: NEGATIVE
Glucose, UA: NEGATIVE mg/dL
Hgb urine dipstick: NEGATIVE
KETONES UR: 15 mg/dL — AB
Leukocytes, UA: NEGATIVE
NITRITE: NEGATIVE
PH: 5.5 (ref 5.0–8.0)
Protein, ur: NEGATIVE mg/dL
Specific Gravity, Urine: 1.006 (ref 1.005–1.030)

## 2015-12-24 LAB — COMPREHENSIVE METABOLIC PANEL
ALT: 44 U/L (ref 17–63)
AST: 130 U/L — AB (ref 15–41)
Albumin: 3 g/dL — ABNORMAL LOW (ref 3.5–5.0)
Alkaline Phosphatase: 107 U/L (ref 38–126)
Anion gap: 16 — ABNORMAL HIGH (ref 5–15)
BILIRUBIN TOTAL: 1.2 mg/dL (ref 0.3–1.2)
BUN: 7 mg/dL (ref 6–20)
CO2: 24 mmol/L (ref 22–32)
CREATININE: 0.48 mg/dL — AB (ref 0.61–1.24)
Calcium: 7.9 mg/dL — ABNORMAL LOW (ref 8.9–10.3)
Chloride: 105 mmol/L (ref 101–111)
Glucose, Bld: 52 mg/dL — ABNORMAL LOW (ref 65–99)
Potassium: 2.8 mmol/L — ABNORMAL LOW (ref 3.5–5.1)
Sodium: 145 mmol/L (ref 135–145)
TOTAL PROTEIN: 6 g/dL — AB (ref 6.5–8.1)

## 2015-12-24 LAB — RAPID URINE DRUG SCREEN, HOSP PERFORMED
Amphetamines: NOT DETECTED
BARBITURATES: NOT DETECTED
Benzodiazepines: NOT DETECTED
COCAINE: NOT DETECTED
Opiates: POSITIVE — AB
Tetrahydrocannabinol: NOT DETECTED

## 2015-12-24 LAB — ETHANOL: ALCOHOL ETHYL (B): 248 mg/dL — AB (ref <5)

## 2015-12-24 MED ORDER — TRIAMCINOLONE ACETONIDE 0.1 % EX CREA
TOPICAL_CREAM | Freq: Two times a day (BID) | CUTANEOUS | Status: DC
  Filled 2015-12-24: qty 15

## 2015-12-24 MED ORDER — TRIAMCINOLONE ACETONIDE 0.1 % EX CREA: TOPICAL_CREAM | Freq: Two times a day (BID) | CUTANEOUS | Status: AC

## 2015-12-24 MED ORDER — SODIUM CHLORIDE 0.9 % IV SOLN
INTRAVENOUS | Status: DC
  Administered 2015-12-24: 17:00:00 via INTRAVENOUS

## 2015-12-24 MED ORDER — SODIUM CHLORIDE 0.9 % IV BOLUS (SEPSIS)
500.0000 mL | Freq: Once | INTRAVENOUS | Status: AC
  Administered 2015-12-24: 500 mL via INTRAVENOUS

## 2015-12-24 MED ORDER — HEPARIN SOD (PORK) LOCK FLUSH 100 UNIT/ML IV SOLN
500.0000 [IU] | Freq: Once | INTRAVENOUS | Status: AC
  Administered 2015-12-24: 500 [IU]
  Filled 2015-12-24: qty 5

## 2015-12-24 MED ORDER — TRIAMCINOLONE ACETONIDE 0.025 % EX CREA
TOPICAL_CREAM | Freq: Three times a day (TID) | CUTANEOUS | Status: DC
  Administered 2015-12-24: 1 via TOPICAL
  Filled 2015-12-24 (×2): qty 15

## 2015-12-24 MED ORDER — CHLORDIAZEPOXIDE HCL 25 MG PO CAPS: ORAL_CAPSULE | ORAL | Status: DC

## 2015-12-24 NOTE — Discharge Instructions (Signed)
Alcohol Intoxication Alcohol intoxication occurs when you drink enough alcohol that it affects your ability to function. It can be mild or very severe. Drinking a lot of alcohol in a short time is called binge drinking. This can be very harmful. Drinking alcohol can also be more dangerous if you are taking medicines or other drugs. Some of the effects caused by alcohol may include:  Loss of coordination.  Changes in mood and behavior.  Unclear thinking.  Trouble talking (slurred speech).  Throwing up (vomiting).  Confusion.  Slowed breathing.  Twitching and shaking (seizures).  Loss of consciousness. HOME CARE  Do not drive after drinking alcohol.  Drink enough water and fluids to keep your pee (urine) clear or pale yellow. Avoid caffeine.  Only take medicine as told by your doctor. GET HELP IF:  You throw up (vomit) many times.  You do not feel better after a few days.  You frequently have alcohol intoxication. Your doctor can help decide if you should see a substance use treatment counselor. GET HELP RIGHT AWAY IF:  You become shaky when you stop drinking.  You have twitching and shaking.  You throw up blood. It may look bright red or like coffee grounds.  You notice blood in your poop (bowel movements).  You become lightheaded or pass out (faint). MAKE SURE YOU:   Understand these instructions.  Will watch your condition.  Will get help right away if you are not doing well or get worse.   This information is not intended to replace advice given to you by your health care provider. Make sure you discuss any questions you have with your health care provider.   Document Released: 01/21/2008 Document Revised: 04/06/2013 Document Reviewed: 01/07/2013 Elsevier Interactive Patient Education 2016 [employer].  Alcohol Abuse and Nutrition Alcohol abuse is any pattern of alcohol consumption that harms your health, relationships, or work. Alcohol abuse can affect  how your body breaks down and absorbs nutrients from food by causing your liver to work abnormally. Additionally, many people who abuse alcohol do not eat enough carbohydrates, protein, fat, vitamins, and minerals. This can cause poor nutrition (malnutrition) and a lack of nutrients (nutrient deficiencies), which can lead to further complications. Nutrients that are commonly lacking (deficient) among people who abuse alcohol include:  Vitamins.  Vitamin A. This is stored in your liver. It is important for your vision, metabolism, and ability to fight off infections (immunity).  B vitamins. These include vitamins such as folate, thiamin, and niacin. These are important in new cell growth and maintenance.  Vitamin C. This plays an important role in iron absorption, wound healing, and immunity.  Vitamin D. This is produced by your liver, but you can also get vitamin D from food. Vitamin D is necessary for your body to absorb and use calcium.  Minerals.  Calcium. This is important for your bones and your heart and blood vessel (cardiovascular) function.  Iron. This is important for blood, muscle, and nervous system functioning.  Magnesium. This plays an important role in muscle and nerve function, and it helps to control blood sugar and blood pressure.  Zinc. This is important for the normal function of your nervous system and digestive system (gastrointestinal tract). Nutrition is an essential component of therapy for alcohol abuse. Your health care provider or dietitian will work with you to design a plan that can help restore nutrients to your body and prevent potential complications. WHAT IS MY PLAN? Your dietitian may develop a specific  diet plan that is based on your condition and any other complications you may have. A diet plan will commonly include:  A balanced diet.  Grains: 6-8 oz per day.  Vegetables: 2-3 cups per day.  Fruits: 1-2 cups per day.  Meat and other protein: 5-6  oz per day.  Dairy: 2-3 cups per day.  Vitamin and mineral supplements. WHAT DO I NEED TO KNOW ABOUT ALCOHOL AND NUTRITION?  Consume foods that are high in antioxidants, such as grapes, berries, nuts, green tea, and dark green and orange vegetables. This can help to counteract some of the stress that is placed on your liver by consuming alcohol.  Avoid food and drinks that are high in fat and sugar. Foods such as sugared soft drinks, salty snack foods, and candy contain empty calories. This means that they lack important nutrients such as protein, fiber, and vitamins.  Eat frequent meals and snacks. Try to eat 5-6 small meals each day.  Eat a variety of fresh fruits and vegetables each day. This will help you get plenty of water, fiber, and vitamins in your diet.  Drink plenty of water and other clear fluids. Try to drink at least 48-64 oz (1.5-2 L) of water per day.  If you are a vegetarian, eat a variety of protein-rich foods. Pair whole grains with plant-based proteins at meals and snacks to obtain the greatest nutrient benefit from your food. For example, eat rice with beans, put peanut butter on whole-grain toast, or eat oatmeal with sunflower seeds.  Soak beans and whole grains overnight before cooking. This can help your body to absorb the nutrients more easily.  Include foods fortified with vitamins and minerals in your diet. Commonly fortified foods include milk, orange juice, cereal, and bread.  If you are malnourished, your dietitian may recommend a high-protein, high-calorie diet. This may include:  2,000-3,000 calories (kilocalories) per day.  70-100 grams of protein per day.  Your health care provider may recommend a complete nutritional supplement beverage. This can help to restore calories, protein, and vitamins to your body. Depending on your condition, you may be advised to consume this instead of or in addition to meals.  Limit your intake of caffeine. Replace drinks  like coffee and black tea with decaffeinated coffee and herbal tea.  Eat a variety of foods that are high in omega fatty acids. These include fish, nuts and seeds, and soybeans. These foods may help your liver to recover and may also stabilize your mood.  Certain medicines may cause changes in your appetite, taste, and weight. Work with your health care provider and dietitian to make any adjustments to your medicines and diet plan.  Include other healthy lifestyle choices in your daily routine.  Be physically active.  Get enough sleep.  Spend time doing activities that you enjoy.  If you are unable to take in enough food and calories by mouth, your health care provider may recommend a feeding tube. This is a tube that passes through your nose and throat, directly into your stomach. Nutritional supplement beverages can be given to you through the feeding tube to help you get the nutrients you need.  Take vitamin or mineral supplements as recommended by your health care provider. WHAT FOODS CAN I EAT? Grains Enriched pasta. Enriched rice. Fortified whole-grain bread. Fortified whole-grain cereal. Barley. Brown rice. Quinoa. [place]. Vegetables All fresh, frozen, and canned vegetables. Spinach. Kale. Artichoke. Carrots. Winter squash and pumpkin. Sweet potatoes. Broccoli. Cabbage. Cucumbers. Tomatoes. Sweet peppers.  Green beans. Peas. Corn. Fruits All fresh and frozen fruits. Berries. Grapes. Mango. Papaya. Guava. Cherries. Apples. Bananas. Peaches. Plums. Pineapple. Watermelon. Cantaloupe. Oranges. Avocado. Meats and Other Protein Sources Beef liver. Lean beef. Pork. Fresh and canned chicken. Fresh fish. Oysters. Sardines. Canned tuna. Shrimp. Eggs with yolks. Nuts and seeds. Peanut butter. Beans and lentils. Soybeans. Tofu. Dairy Whole, low-fat, and nonfat milk. Whole, low-fat, and nonfat yogurt. Cottage cheese. Sour cream. Hard and soft cheeses. Beverages Water. Herbal tea. Decaffeinated  coffee. Decaffeinated green tea. 100% fruit juice. 100% vegetable juice. Instant breakfast shakes. Condiments Ketchup. Mayonnaise. Mustard. Salad dressing. Barbecue sauce. Sweets and Desserts Sugar-free ice cream. Sugar-free pudding. Sugar-free gelatin. Fats and Oils Butter. Vegetable oil, flaxseed oil, olive oil, and walnut oil. Other Complete nutrition shakes. Protein bars. Sugar-free gum. The items listed above may not be a complete list of recommended foods or beverages. Contact your dietitian for more options. WHAT FOODS ARE NOT RECOMMENDED? Grains Sugar-sweetened breakfast cereals. Flavored instant oatmeal. Fried breads. Vegetables Breaded or deep-fried vegetables. Fruits Dried fruit with added sugar. Candied fruit. Canned fruit in syrup. Meats and Other Protein Sources Breaded or deep-fried meats. Dairy Flavored milks. Fried cheese curds or fried cheese sticks. Beverages Alcohol. Sugar-sweetened soft drinks. Sugar-sweetened tea. Caffeinated coffee and tea. Condiments Sugar. Honey. Agave nectar. Molasses. Sweets and Desserts Chocolate. Cake. Cookies. Candy. Other Potato chips. Pretzels. Salted nuts. Candied nuts. The items listed above may not be a complete list of foods and beverages to avoid. Contact your dietitian for more information.   This information is not intended to replace advice given to you by your health care provider. Make sure you discuss any questions you have with your health care provider.   Document Released: 05/29/2005 Document Revised: 08/25/2014 Document Reviewed: 03/07/2014 Elsevier Interactive Patient Education 2016 [employer].  Alcohol Use Disorder Alcohol use disorder is a mental disorder. It is not a one-time incident of heavy drinking. Alcohol use disorder is the excessive and uncontrollable use of alcohol over time that leads to problems with functioning in one or more areas of daily living. People with this disorder risk harming themselves  and others when they drink to excess. Alcohol use disorder also can cause other mental disorders, such as mood and anxiety disorders, and serious physical problems. People with alcohol use disorder often misuse other drugs.  Alcohol use disorder is common and widespread. Some people with this disorder drink alcohol to cope with or escape from negative life events. Others drink to relieve chronic pain or symptoms of mental illness. People with a family history of alcohol use disorder are at higher risk of losing control and using alcohol to excess.  Drinking too much alcohol can cause injury, accidents, and health problems. One drink can be too much when you are:  Working.  Pregnant or breastfeeding.  Taking medicines. Ask your doctor.  Driving or planning to drive. SYMPTOMS  Signs and symptoms of alcohol use disorder may include the following:   Consumption ofalcohol inlarger amounts or over a longer period of time than intended.  Multiple unsuccessful attempts to cutdown or control alcohol use.   A great deal of time spent obtaining alcohol, using alcohol, or recovering from the effects of alcohol (hangover).  A strong desire or urge to use alcohol (cravings).   Continued use of alcohol despite problems at work, school, or home because of alcohol use.   Continued use of alcohol despite problems in relationships because of alcohol use.  Continued use of alcohol in  situations when it is physically hazardous, such as driving a car.  Continued use of alcohol despite awareness of a physical or psychological problem that is likely related to alcohol use. Physical problems related to alcohol use can involve the brain, heart, liver, stomach, and intestines. Psychological problems related to alcohol use include intoxication, depression, anxiety, psychosis, delirium, and dementia.   The need for increased amounts of alcohol to achieve the same desired effect, or a decreased effect from  the consumption of the same amount of alcohol (tolerance).  Withdrawal symptoms upon reducing or stopping alcohol use, or alcohol use to reduce or avoid withdrawal symptoms. Withdrawal symptoms include:  Racing heart.  Hand tremor.  Difficulty sleeping.  Nausea.  Vomiting.  Hallucinations.  Restlessness.  Seizures. DIAGNOSIS Alcohol use disorder is diagnosed through an assessment by your health care provider. Your health care provider may start by asking three or four questions to screen for excessive or problematic alcohol use. To confirm a diagnosis of alcohol use disorder, at least two symptoms must be present within a 16-month period. The severity of alcohol use disorder depends on the number of symptoms:  Mild--two or three.  Moderate--four or five.  Severe--six or more. Your health care provider may perform a physical exam or use results from lab tests to see if you have physical problems resulting from alcohol use. Your health care provider may refer you to a mental health professional for evaluation. TREATMENT  Some people with alcohol use disorder are able to reduce their alcohol use to low-risk levels. Some people with alcohol use disorder need to quit drinking alcohol. When necessary, mental health professionals with specialized training in substance use treatment can help. Your health care provider can help you decide how severe your alcohol use disorder is and what type of treatment you need. The following forms of treatment are available:   Detoxification. Detoxification involves the use of prescription medicines to prevent alcohol withdrawal symptoms in the first week after quitting. This is important for people with a history of symptoms of withdrawal and for heavy drinkers who are likely to have withdrawal symptoms. Alcohol withdrawal can be dangerous and, in severe cases, cause death. Detoxification is usually provided in a hospital or in-patient substance use  treatment facility.  Counseling or talk therapy. Talk therapy is provided by substance use treatment counselors. It addresses the reasons people use alcohol and ways to keep them from drinking again. The goals of talk therapy are to help people with alcohol use disorder find healthy activities and ways to cope with life stress, to identify and avoid triggers for alcohol use, and to handle cravings, which can cause relapse.  Medicines.Different medicines can help treat alcohol use disorder through the following actions:  Decrease alcohol cravings.  Decrease the positive reward response felt from alcohol use.  Produce an uncomfortable physical reaction when alcohol is used (aversion therapy).  Support groups. Support groups are run by people who have quit drinking. They provide emotional support, advice, and guidance. These forms of treatment are often combined. Some people with alcohol use disorder benefit from intensive combination treatment provided by specialized substance use treatment centers. Both inpatient and outpatient treatment programs are available.   This information is not intended to replace advice given to you by your health care provider. Make sure you discuss any questions you have with your health care provider.   Document Released: 09/11/2004 Document Revised: 08/25/2014 Document Reviewed: 11/11/2012 Elsevier Interactive Patient Education [employer].

## 2015-12-24 NOTE — ED Notes (Signed)
Pt cannot use restroom at this time, aware urine specimen is needed.  

## 2015-12-24 NOTE — ED Provider Notes (Addendum)
CSN: [id number]     Arrival date & time 12/24/15  1047 History   First MD Initiated Contact with Patient 12/24/15 1445     Chief Complaint  Patient presents with  . Rash  . Allergic Reaction     (Consider location/radiation/quality/duration/timing/severity/associated sxs/prior Treatment) The history is provided by the patient.     Marvin [name] is a 40 y.o. male who presents for evaluation of Excessive alcohol intake, and rash on face. He also is on his anxiety medications. He is being treated for lung cancer, last about one month ago. He is taking Benadryl for rash. He denies nausea, vomiting, cough, shortness of breath or chest pain. There are no other known modifying factors.   Past Medical History  Diagnosis Date  . ETOH abuse   . Alcohol dependence ([place]) 04/17/2012  . COPD (chronic obstructive pulmonary disease) ([place])   . Shortness of breath dyspnea     occ  . Anxiety   . GERD (gastroesophageal reflux disease)   . Seizures ([place])     one episode 09/2012, suspected related to ETOH withdrawal  . Anxiety disorder 03/12/2015  . Headache 05/10/2015  . Hypokalemia 05/10/2015  . 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 Cancer ([place])   . Throat cancer ([place])   . Severe major depression, single episode, without psychotic features ([place]) 09/13/2015  . Fatigue 11/23/2015   Past Surgical History  Procedure Laterality Date  . Knee surgery Left     kneecap -screws  . Appendectomy    . Tracheostomy tube placement N/A 02/22/2015    Procedure: TRACHEOSTOMY;  Surgeon: [name], MD;  Location: [facility];  Service: ENT;  Laterality: N/A;  awake tracheostomy  . Direct laryngoscopy N/A 02/22/2015    Procedure: DIRECT LARYNGOSCOPY;  Surgeon: [name], MD;  Location: [facility];  Service: ENT;  Laterality: N/A;  direct laryngoscopy with biopsy  . Multiple extractions with alveoloplasty N/A 03/05/2015    Procedure: Extraction of tooth #'s 1,2,8,9,15,16,17,18,31,32 with alveoloplasty, mandibular right  lateral exostoses, and gross debridement of remaining teeth;  Surgeon: Lenn Cal, DDS;  Location: [facility];  Service: Oral Surgery;  Laterality: N/A;   Family History  Problem Relation Age of Onset  . Cancer Mother   . Heart attack Maternal Grandmother     age of death 46  . Heart attack Maternal Grandfather   . Cancer Paternal Grandfather    Social History  Substance Use Topics  . Smoking status: Former Smoker -- 1.00 packs/day for 28 years    Types: Cigarettes    Quit date: 01/17/2015  . Smokeless tobacco: Never Used  . Alcohol Use: 0.0 oz/week    0 Standard drinks or equivalent per week     Comment: 6 -40-oz. beers daily    Review of Systems  All other systems reviewed and are negative.     Allergies  Cetuximab  Home Medications   Prior to Admission medications   Medication Sig Start Date End Date Taking? Authorizing Provider  Chlorpheniramine Maleate (ALLERGY PO) Take 1 tablet by mouth daily as needed (allergies.).   Yes Historical Provider, MD  fludrocortisone (FLORINEF) 0.1 MG tablet Take 2 tablets (0.2 mg total) by mouth daily. 11/05/15  Yes [name], MD  FLUoxetine (PROZAC) 20 MG capsule Take 1 capsule (20 mg total) by mouth daily. 11/15/15  Yes [name], MD  ondansetron (ZOFRAN) 8 MG tablet Take 1 tablet (8 mg total) by mouth every 8 (eight) hours as needed for nausea. 10/29/15  Yes [name], MD  potassium chloride SA (K-DUR,KLOR-CON) 20 MEQ tablet Take 1 tablet (20 mEq total) by mouth 2 (two) times daily. 11/23/15  Yes [name], MD  prochlorperazine (COMPAZINE) 10 MG tablet Take 1 tablet (10 mg total) by mouth every 6 (six) hours as needed. Patient taking differently: Take 10 mg by mouth every 6 (six) hours as needed for nausea or vomiting.  10/29/15  Yes [name], MD  senna (SENOKOT) 8.6 MG TABS tablet Take 1 tablet (8.6 mg total) by mouth 2 (two) times daily. Patient taking differently: Take 1 tablet by mouth 2 (two) times daily as needed for mild  constipation (when taking pain medication.).  10/15/15  Yes [name], MD  traZODone (DESYREL) 100 MG tablet Take 1 tablet (100 mg total) by mouth at bedtime as needed for sleep. 10/29/15  Yes [name], MD  chlordiazePOXIDE (LIBRIUM) 25 MG capsule 50mg  PO TID x 1D, then 25-50mg  PO BID X 1D, then 25-50mg  PO QD X 1D 12/24/15   Daleen Bo, MD  triamcinolone cream (KENALOG) 0.1 % Apply topically 2 (two) times daily. 12/24/15   Daleen Bo, MD   BP 86/63 mmHg  Pulse 74  Temp(Src) 97.6 F (36.4 C) (Oral)  Resp 18  Ht 6\' 1"  (1.854 m)  Wt 140 lb (63.504 kg)  BMI 18.47 kg/m2  SpO2 98% Physical Exam  Constitutional: He is oriented to person, place, and time. He appears well-developed.  Appears older than stated age, frail, malnourished appearance.  HENT:  Head: Normocephalic and atraumatic.  Right Ear: External ear normal.  Left Ear: External ear normal.  Eyes: Conjunctivae and EOM are normal. Pupils are equal, round, and reactive to light.  Neck: Normal range of motion and phonation normal. Neck supple.  Cardiovascular: Normal rate, regular rhythm and normal heart sounds.   Pulmonary/Chest: Effort normal and breath sounds normal. No respiratory distress. He exhibits no bony tenderness.  Abdominal: Soft. He exhibits no distension and no mass. There is no tenderness.  Musculoskeletal: Normal range of motion.  Neurological: He is alert and oriented to person, place, and time. No cranial nerve deficit or sensory deficit. He exhibits normal muscle tone. Coordination normal.  Skin: Skin is warm, dry and intact.  Red excoriated rash, face, bilaterally from forehead to chin. No significant areas of drainage, or bleeding.  Psychiatric: He has a normal mood and affect. His behavior is normal. Judgment and thought content normal.  Nursing note and vitals reviewed.   ED Course  Procedures (including critical care time)  Initial clinical impression- alcoholism, with nonspecific facial rash.  Noncompliance with medical treatment, related to alcoholism, and difficult social situation. The patient has been given an ultimatum by oncology, stating that they will not treat his cancer, until his drinking and depression is under control. The patient was supposed to go to [place], this morning, to seek care for his psychiatric condition, but chose to come here instead. He lives with his brother, who has been helpful and is trying to get him back on the right track.  Review of  Narcotic Reporting System- Last refill of Narcotics (short and long acting) and Benzo., was on 10/29/15.  Medications  0.9 %  sodium chloride infusion ( Intravenous New Bag/Given 12/24/15 1715)  sodium chloride 0.9 % bolus 500 mL (0 mLs Intravenous Stopped 12/24/15 1814)    Patient Vitals for the past 24 hrs:  BP Temp Temp src Pulse Resp SpO2 Height Weight  12/24/15 1939 (!) 86/63 mmHg 97.6 F (  36.4 C) Oral 74 18 98 % - -  12/24/15 1900 97/65 mmHg - - (!) 49 13 96 % - -  12/24/15 1856 92/65 mmHg - - (!) 50 20 96 % - -  12/24/15 1830 92/65 mmHg - - (!) 49 15 95 % - -  12/24/15 1814 94/71 mmHg - - (!) 49 18 96 % - -  12/24/15 1800 94/71 mmHg - - (!) 51 14 94 % - -  12/24/15 1749 (!) 82/66 mmHg - - (!) 51 16 95 % - -  12/24/15 1730 (!) 82/66 mmHg - - (!) 56 15 98 % - -  12/24/15 1700 90/65 mmHg - - (!) 48 14 97 % - -  12/24/15 1630 (!) 87/64 mmHg - - (!) 53 10 97 % - -  12/24/15 1621 (!) 82/53 mmHg - - 60 13 98 % - -  12/24/15 1600 (!) 82/53 mmHg - - (!) 48 (!) 9 96 % - -  12/24/15 1530 (!) 85/53 mmHg - - (!) 48 12 99 % - -  12/24/15 1500 93/63 mmHg - - - - - - -  12/24/15 1500 91/66 mmHg - - (!) 53 11 98 % - -  12/24/15 1446 (!) 85/54 mmHg 98.1 F (36.7 C) Oral 70 15 94 % - -  12/24/15 1309 99/67 mmHg 98.4 F (36.9 C) Oral 70 14 96 % - -  12/24/15 1118 97/73 mmHg 97.6 F (36.4 C) Oral 76 16 95 % 6\' 1"  (1.854 m) 140 lb (63.504 kg)    7:42 PM Reevaluation with update and discussion. After initial assessment and  treatment, an updated evaluation reveals patient states that he feels better at this time. He is persistently hypotensive, and states that he has been off his Florinef for a few days, because he has been drinking alcohol. Findings were discussed with the patient, and his brother, who is with him and will go home with him. The patient has Florinef with him, and can take it, as well as refills to take when it runs out. I discussed the findings with the patient and his brother, they understand that the patient absolutely has to stop drinking. They have been given resources, and understand that it is important to use AA, to get help. All questions were answered. Caymen Dubray L   Labs Review Labs Reviewed  COMPREHENSIVE METABOLIC PANEL - Abnormal; Notable for the following:    Potassium 2.8 (*)    Glucose, Bld 52 (*)    Creatinine, Ser 0.48 (*)    Calcium 7.9 (*)    Total Protein 6.0 (*)    Albumin 3.0 (*)    AST 130 (*)    Anion gap 16 (*)    All other components within normal limits  ETHANOL - Abnormal; Notable for the following:    Alcohol, Ethyl (B) 248 (*)    All other components within normal limits  CBC WITH DIFFERENTIAL/PLATELET - Abnormal; Notable for the following:    WBC 2.2 (*)    RBC 3.45 (*)    Hemoglobin 11.3 (*)    HCT 33.7 (*)    RDW 20.6 (*)    Platelets 67 (*)    Neutro Abs 1.4 (*)    Lymphs Abs 0.6 (*)    All other components within normal limits  URINALYSIS, ROUTINE W REFLEX MICROSCOPIC (NOT AT [facility]) - Abnormal; Notable for the following:    Ketones, ur 15 (*)    All other components within normal limits  URINE RAPID DRUG SCREEN, HOSP PERFORMED - Abnormal; Notable for the following:    Opiates POSITIVE (*)    All other components within normal limits    Imaging Review No results found. I have personally reviewed and evaluated these images and lab results as part of my medical decision-making.   EKG Interpretation None      MDM   Final diagnoses:   Alcoholism ([place])  Alcohol intoxication, with unspecified complication ([place])  Facial rash   Alcoholism with alcohol intoxication. Noncompliance with prior efforts to help stop drinking. Patient has a supportive home environment, and is stable for discharge and outpatient management. He has medications for chronic hypotension. Will treat symptoms of alcohol withdrawal with Librium. Doubt metabolic instability or impending vascular collapse.   Nursing Notes Reviewed/ Care Coordinated Applicable Imaging Reviewed Interpretation of Laboratory Data incorporated into ED treatment  The patient appears reasonably screened and/or stabilized for discharge and I doubt any other medical condition or other [facility] requiring further screening, evaluation, or treatment in the ED at this time prior to discharge.  Plan: Home Medications- Librium; Home Treatments- stop EtOH; return here if the recommended treatment, does not improve the symptoms; Recommended follow up- PCP and Psychiatry asap. Follow-up for substance abuse treatment, as soon as possible.     Daleen Bo, MD 12/24/15 1948  Daleen Bo, MD 03/08/16 [phone]

## 2015-12-24 NOTE — ED Notes (Addendum)
Pt c/o rash on face, neck, and ears x 4 days. +itching  Pt reports that he ran out of his anxiety medication, started drinking, and then, noticed the rash.  Hx of Lung CA w/ last treatment over a month ago.  Pt reports taking Benadryl w/ "a little" relief.      Pt reports drinking a 40oz beer around 0900.

## 2015-12-24 NOTE — Progress Notes (Signed)
[facility] consulted to speak to patient regarding medications needs.  EDCM spoke to patient at bedside.  Patient's brother at bedside.  Patient lives with his brother [name] Dixon.  Patient reports he is able to complete his ADL's without difficulty.  Patient's brother reports he is able to complete his ADL's if "He is not drinking."  Patient's brother went on to say that the cancer center will not start treatment again until her is seen by a psychiatrist and gets assistance with his drinking.  Patient has not had cancer treatment in four months.  Patient with Medicaid insurance, no pcp.  [facility] provided patient with a list of pcps who accept Medicaid insurance in [place].  Patient reports he has been to three alcohol rehabs in the past and has found them helpful.  Patient's brother reports the patient has bad nerves "And without anxiety medication, he will not be able to stop drinking."  Patient's brother reports they have an aunt who  Lives up the street who is retired and will take patient to his appointments if he is not drinking.  [facility] consulted EDSW to provide patient with outpatient alcohol rehab centers.  Discussed patient with EDP.  No further EDCM needs at this time.

## 2015-12-24 NOTE — Telephone Encounter (Signed)
I received a voicemail from Marvin [name] letting me know that he was in the ER at [facility] because "my face is all broke out."    I will ask [name], RN-H&N Navigator to help follow-up on this patient's status after he is released from the hospital.   Mike Craze, NP [place] [phone]

## 2015-12-25 ENCOUNTER — Other Ambulatory Visit: Payer: Self-pay | Admitting: Hematology and Oncology

## 2015-12-25 NOTE — Progress Notes (Signed)
CSW met with pt at bedside and provide patient with outpatient alcohol rehab centers.Patient was alert and oriented.   Patient is not interested in other community resources. Patient informed CSW that he lives at home with his brother.  Willette Brace [phone] ED CSW 12/25/2015 11:03 PM

## 2015-12-31 ENCOUNTER — Telehealth: Payer: Self-pay | Admitting: *Deleted

## 2015-12-31 NOTE — Telephone Encounter (Signed)
  Oncology Nurse Navigator Documentation  Navigator Location: [facility] Onc (12/31/15 1316) Navigator Encounter Type: Telephone (12/31/15 1316) Telephone: Incoming Call;Patient Update (12/31/15 1316)                 Interventions: Coordination of Care (12/31/15 1316)       Received call from Marvin [name].  He reported:  Has not anything to drink since last week when he visited the [facility] ED.  He indicated the medication issued to him has helped with ETOH withdrawal.  [place] this morning, was provided 20 mg Prozac Rx with instructions to take daily.  He has taken today's dose.  He has an appt at [facility] in two weeks to start counseling. He further stated "I really want to start treatment".  I recognized the positive actions over the past week, noted I will inform Dr. Alvy Bimler of his progress and interest in moving forward with tmt.  He voiced understanding.  This note routed to Dr. Alvy Bimler and Survivorship NP Mike Craze.  [name], RN, BSN, [place] at [place] [phone]                    Time Spent with Patient: 30 (12/31/15 1316)

## 2016-01-01 ENCOUNTER — Telehealth: Payer: Self-pay | Admitting: *Deleted

## 2016-01-01 NOTE — Telephone Encounter (Signed)
  Oncology Nurse Navigator Documentation  Navigator Location: [facility] (01/01/16 1149) Navigator Encounter Type: Telephone (01/01/16 1149) Telephone: [place] Call (01/01/16 1149)                 Interventions: Coordination of Care (01/01/16 1149)       Called Mr. [place], [place] indicating I need to speak with him re appt options to see Dr. Alvy Bimler.    [name], RN, BSN, [place] at [place] [phone]                   Time Spent with Patient: 15 (01/01/16 1149)

## 2016-01-01 NOTE — Telephone Encounter (Signed)
I can see him on these dates/time: 5/17: 930, 1030 or 1130 5/24 830, 9 or 1115  Please work with family re: transportation first and let me know ASAP

## 2016-01-01 NOTE — Telephone Encounter (Signed)
  Oncology Nurse Navigator Documentation  Navigator Location: [facility] (01/01/16 1642)   Telephone: Incoming Call;Appt Confirmation/Clarification (01/01/16 1642)                     Juanda Crumble returned my call, he agreed to see Dr. Alvy Bimler tomorrow at 1130.  I stressed the importance of a family member being present for the appt, that he must have reliable transportation as he proceeds with tmt.  He provided assurance these guidelines would be met.  I issued URGENT POF for this appt, notified Dr. Alvy Bimler of Juanda Crumble' appt choice.  [name], RN, BSN, [place] at [place] [phone]                       Time Spent with Patient: 30 (01/01/16 1642)

## 2016-01-02 ENCOUNTER — Other Ambulatory Visit (HOSPITAL_BASED_OUTPATIENT_CLINIC_OR_DEPARTMENT_OTHER): Payer: Medicaid Other

## 2016-01-02 ENCOUNTER — Encounter: Payer: Self-pay | Admitting: *Deleted

## 2016-01-02 ENCOUNTER — Other Ambulatory Visit: Payer: Self-pay | Admitting: *Deleted

## 2016-01-02 ENCOUNTER — Ambulatory Visit (HOSPITAL_BASED_OUTPATIENT_CLINIC_OR_DEPARTMENT_OTHER): Payer: Medicaid Other

## 2016-01-02 ENCOUNTER — Ambulatory Visit (HOSPITAL_BASED_OUTPATIENT_CLINIC_OR_DEPARTMENT_OTHER): Payer: Medicaid Other | Admitting: Hematology and Oncology

## 2016-01-02 ENCOUNTER — Other Ambulatory Visit: Payer: Self-pay | Admitting: Hematology and Oncology

## 2016-01-02 ENCOUNTER — Telehealth: Payer: Self-pay | Admitting: Hematology and Oncology

## 2016-01-02 ENCOUNTER — Telehealth: Payer: Self-pay | Admitting: *Deleted

## 2016-01-02 VITALS — BP 97/71 | HR 85 | Temp 98.2°F | Resp 18 | Ht 73.0 in | Wt 135.9 lb

## 2016-01-02 VITALS — BP 108/80 | HR 55 | Resp 18

## 2016-01-02 DIAGNOSIS — Z5112 Encounter for antineoplastic immunotherapy: Secondary | ICD-10-CM

## 2016-01-02 DIAGNOSIS — R748 Abnormal levels of other serum enzymes: Secondary | ICD-10-CM

## 2016-01-02 DIAGNOSIS — R945 Abnormal results of liver function studies: Secondary | ICD-10-CM

## 2016-01-02 DIAGNOSIS — C7800 Secondary malignant neoplasm of unspecified lung: Secondary | ICD-10-CM

## 2016-01-02 DIAGNOSIS — D61810 Antineoplastic chemotherapy induced pancytopenia: Secondary | ICD-10-CM | POA: Diagnosis not present

## 2016-01-02 DIAGNOSIS — F331 Major depressive disorder, recurrent, moderate: Secondary | ICD-10-CM | POA: Diagnosis not present

## 2016-01-02 DIAGNOSIS — C321 Malignant neoplasm of supraglottis: Secondary | ICD-10-CM

## 2016-01-02 DIAGNOSIS — T451X5A Adverse effect of antineoplastic and immunosuppressive drugs, initial encounter: Secondary | ICD-10-CM

## 2016-01-02 DIAGNOSIS — F1020 Alcohol dependence, uncomplicated: Secondary | ICD-10-CM | POA: Diagnosis not present

## 2016-01-02 DIAGNOSIS — E43 Unspecified severe protein-calorie malnutrition: Secondary | ICD-10-CM | POA: Diagnosis not present

## 2016-01-02 LAB — COMPREHENSIVE METABOLIC PANEL
ALBUMIN: 2.9 g/dL — AB (ref 3.5–5.0)
ALT: 100 U/L — AB (ref 0–55)
AST: 81 U/L — AB (ref 5–34)
Alkaline Phosphatase: 111 U/L (ref 40–150)
Anion Gap: 7 mEq/L (ref 3–11)
BUN: 9.9 mg/dL (ref 7.0–26.0)
CO2: 32 mEq/L — ABNORMAL HIGH (ref 22–29)
CREATININE: 0.7 mg/dL (ref 0.7–1.3)
Calcium: 8.8 mg/dL (ref 8.4–10.4)
Chloride: 104 mEq/L (ref 98–109)
EGFR: >90 mL/min/{1.73_m2} (ref >90)
GLUCOSE: 108 mg/dL (ref 70–140)
Potassium: 3.3 mEq/L — ABNORMAL LOW (ref 3.5–5.1)
SODIUM: 142 meq/L (ref 136–145)
TOTAL PROTEIN: 5.9 g/dL — AB (ref 6.4–8.3)
Total Bilirubin: 0.65 mg/dL (ref 0.20–1.20)

## 2016-01-02 LAB — CBC WITH DIFFERENTIAL/PLATELET
BASO%: 0.8 % (ref 0.0–2.0)
Basophils Absolute: 0 10*3/uL (ref 0.0–0.1)
EOS ABS: 0.1 10*3/uL (ref 0.0–0.5)
EOS%: 2.8 % (ref 0.0–7.0)
HCT: 33.4 % — ABNORMAL LOW (ref 38.4–49.9)
HEMOGLOBIN: 10.9 g/dL — AB (ref 13.0–17.1)
LYMPH%: 19.5 % (ref 14.0–49.0)
MCH: 33.4 pg (ref 27.2–33.4)
MCHC: 32.6 g/dL (ref 32.0–36.0)
MCV: 102.6 fL — ABNORMAL HIGH (ref 79.3–98.0)
MONO#: 0.7 10*3/uL (ref 0.1–0.9)
MONO%: 28.4 % — AB (ref 0.0–14.0)
NEUT%: 48.5 % (ref 39.0–75.0)
NEUTROS ABS: 1.2 10*3/uL — AB (ref 1.5–6.5)
Platelets: 127 10*3/uL — ABNORMAL LOW (ref 140–400)
RBC: 3.25 10*6/uL — ABNORMAL LOW (ref 4.20–5.82)
RDW: 20.4 % — AB (ref 11.0–14.6)
WBC: 2.4 10*3/uL — AB (ref 4.0–10.3)
lymph#: 0.5 10*3/uL — ABNORMAL LOW (ref 0.9–3.3)

## 2016-01-02 MED ORDER — HYDROMORPHONE HCL 4 MG/ML IJ SOLN
INTRAMUSCULAR | Status: AC
  Filled 2016-01-02: qty 1

## 2016-01-02 MED ORDER — SODIUM CHLORIDE 0.9 % IV SOLN
Freq: Once | INTRAVENOUS | Status: AC
  Administered 2016-01-02: 12:00:00 via INTRAVENOUS
  Filled 2016-01-02: qty 4

## 2016-01-02 MED ORDER — MORPHINE SULFATE 30 MG PO TABS: 30.0000 mg | ORAL_TABLET | Freq: Four times a day (QID) | ORAL | Status: DC | PRN

## 2016-01-02 MED ORDER — PROCHLORPERAZINE MALEATE 10 MG PO TABS: 10.0000 mg | ORAL_TABLET | Freq: Four times a day (QID) | ORAL | Status: DC | PRN

## 2016-01-02 MED ORDER — HEPARIN SOD (PORK) LOCK FLUSH 100 UNIT/ML IV SOLN
500.0000 [IU] | Freq: Once | INTRAVENOUS | Status: AC | PRN
  Administered 2016-01-02: 500 [IU]
  Filled 2016-01-02: qty 5

## 2016-01-02 MED ORDER — SODIUM CHLORIDE 0.9 % IV SOLN
200.0000 mg | Freq: Once | INTRAVENOUS | Status: AC
  Administered 2016-01-02: 200 mg via INTRAVENOUS
  Filled 2016-01-02: qty 8

## 2016-01-02 MED ORDER — SODIUM CHLORIDE 0.9 % IV SOLN: Freq: Once | INTRAVENOUS | Status: DC

## 2016-01-02 MED ORDER — HYDROMORPHONE HCL 1 MG/ML IJ SOLN
2.0000 mg | INTRAMUSCULAR | Status: DC | PRN
  Administered 2016-01-02: 2 mg via INTRAVENOUS
  Filled 2016-01-02: qty 2

## 2016-01-02 MED ORDER — SODIUM CHLORIDE 0.9 % IJ SOLN
10.0000 mL | INTRAMUSCULAR | Status: DC | PRN
  Administered 2016-01-02: 10 mL
  Filled 2016-01-02: qty 10

## 2016-01-02 MED ORDER — ONDANSETRON HCL 8 MG PO TABS: 8.0000 mg | ORAL_TABLET | Freq: Three times a day (TID) | ORAL | Status: AC | PRN

## 2016-01-02 MED ORDER — SODIUM CHLORIDE 0.9 % IV SOLN
Freq: Once | INTRAVENOUS | Status: AC
  Administered 2016-01-02: 12:00:00 via INTRAVENOUS

## 2016-01-02 MED FILL — MORPHINE SULFATE IR 30 MG T: 30 | 22 days supply | Qty: 90 | Fill #0

## 2016-01-02 MED FILL — ONDANSETRON HCL 8 MG TABLET: 8 | 30 days supply | Qty: 90 | Fill #0

## 2016-01-02 MED FILL — PROCHLORPERAZINE 10 MG TAB: 10 | 22 days supply | Qty: 90 | Fill #0

## 2016-01-02 NOTE — Progress Notes (Signed)
  Oncology Nurse Navigator Documentation  Navigator Location: [facility] (01/02/16 1130) Navigator Encounter Type: Clinic/MDC (01/02/16 1130)           Patient Visit Type: MedOnc (01/02/16 1130) Treatment Phase: Treatment (01/02/16 1130)       Met with Marvin [name] during appt with Dr. Alvy Bimler.  He was accompanied by his [name].  He voiced understanding of Dr. Calton Dach emphasis on the importance of nutrition and ETOH abstinence during treatment, maintaining appts with [facility] for mental health support.  He received his 1st dose Keytruda today, voiced understanding he will receive subsequent doses in 3 week intervals.  He understands he can contact me with needs/concerns.  [name], RN, BSN, [place] at [place] [phone]                         Time Spent with Patient: 45 (01/02/16 1130)

## 2016-01-02 NOTE — Telephone Encounter (Signed)
Per staff message and POF I have scheduled appts. Advised scheduler of appts. JMW  

## 2016-01-02 NOTE — Patient Instructions (Addendum)
Pembrolizumab injection What is this medicine? PEMBROLIZUMAB (pem broe liz ue mab) is a monoclonal antibody. It is used to treat melanoma and non-small cell lung cancer. This medicine may be used for other purposes; ask your health care provider or pharmacist if you have questions. What should I tell my health care provider before I take this medicine? They need to know if you have any of these conditions: -diabetes -immune system problems -inflammatory bowel disease -liver disease -lung or breathing disease -lupus -an unusual or allergic reaction to pembrolizumab, other medicines, foods, dyes, or preservatives -pregnant or trying to get pregnant -breast-feeding How should I use this medicine? This medicine is for infusion into a vein. It is given by a health care professional in a hospital or clinic setting. A special MedGuide will be given to you before each treatment. Be sure to read this information carefully each time. Talk to your pediatrician regarding the use of this medicine in children. Special care may be needed. Overdosage: If you think you have taken too much of this medicine contact a poison control center or emergency room at once. NOTE: This medicine is only for you. Do not share this medicine with others. What if I miss a dose? It is important not to miss your dose. Call your doctor or health care professional if you are unable to keep an appointment. What may interact with this medicine? Interactions have not been studied. Give your health care provider a list of all the medicines, herbs, non-prescription drugs, or dietary supplements you use. Also tell them if you smoke, drink alcohol, or use illegal drugs. Some items may interact with your medicine. This list may not describe all possible interactions. Give your health care provider a list of all the medicines, herbs, non-prescription drugs, or dietary supplements you use. Also tell them if you smoke, drink alcohol, or  use illegal drugs. Some items may interact with your medicine. What should I watch for while using this medicine? Your condition will be monitored carefully while you are receiving this medicine. You may need blood work done while you are taking this medicine. Do not become pregnant while taking this medicine or for 4 months after stopping it. Women should inform their doctor if they wish to become pregnant or think they might be pregnant. There is a potential for serious side effects to an unborn child. Talk to your health care professional or pharmacist for more information. Do not breast-feed an infant while taking this medicine or for 4 months after the last dose. What side effects may I notice from receiving this medicine? Side effects that you should report to your doctor or health care professional as soon as possible: -allergic reactions like skin rash, itching or hives, swelling of the face, lips, or tongue -bloody or black, tarry stools -breathing problems -change in the amount of urine -changes in vision -chest pain -chills -dark urine -dizziness or feeling faint or lightheaded -fast or irregular heartbeat -fever -flushing -hair loss -muscle pain -muscle weakness -persistent headache -signs and symptoms of high blood sugar such as dizziness; dry mouth; dry skin; fruity breath; nausea; stomach pain; increased hunger or thirst; increased urination -signs and symptoms of liver injury like dark urine, light-colored stools, loss of appetite, nausea, right upper belly pain, yellowing of the eyes or skin -stomach pain -weight loss Side effects that usually do not require medical attention (Report these to your doctor or health care professional if they continue or are bothersome.):constipation -cough -diarrhea -joint pain -  tiredness This list may not describe all possible side effects. Call your doctor for medical advice about side effects. You may report side effects to FDA at  1-800-FDA-1088. Where should I keep my medicine? This drug is given in a hospital or clinic and will not be stored at home. NOTE: This sheet is a summary. It may not cover all possible information. If you have questions about this medicine, talk to your doctor, pharmacist, or health care provider.    2016, Elsevier/Gold Standard. (2014-10-03 17:24:19)  [facility] Discharge Instructions for Patients Receiving Chemotherapy  Today you received the following chemotherapy agents Keytruda.  To help prevent nausea and vomiting after your treatment, we encourage you to take your nausea medication as directed.   If you develop nausea and vomiting that is not controlled by your nausea medication, call the clinic.   BELOW ARE SYMPTOMS THAT SHOULD BE REPORTED IMMEDIATELY:  *FEVER GREATER THAN 100.5 F  *CHILLS WITH OR WITHOUT FEVER  NAUSEA AND VOMITING THAT IS NOT CONTROLLED WITH YOUR NAUSEA MEDICATION  *UNUSUAL SHORTNESS OF BREATH  *UNUSUAL BRUISING OR BLEEDING  TENDERNESS IN MOUTH AND THROAT WITH OR WITHOUT PRESENCE OF ULCERS  *URINARY PROBLEMS  *BOWEL PROBLEMS  UNUSUAL RASH Items with * indicate a potential emergency and should be followed up as soon as possible.  Feel free to call the clinic you have any questions or concerns. The clinic phone number is [phone].  Please show the CHEMO ALERT CARD at check-in to the Emergency Department and triage nurse.    

## 2016-01-02 NOTE — Telephone Encounter (Signed)
per pof to sch pt appt-sent Mw email to sch trmt-pt to get updted copy of avs

## 2016-01-03 ENCOUNTER — Encounter: Payer: Self-pay | Admitting: Hematology and Oncology

## 2016-01-03 ENCOUNTER — Telehealth: Payer: Self-pay

## 2016-01-03 DIAGNOSIS — R748 Abnormal levels of other serum enzymes: Secondary | ICD-10-CM | POA: Insufficient documentation

## 2016-01-03 NOTE — Assessment & Plan Note (Signed)
It is hard to believe that the pancytopenia now could be related to prior treatment. More than likely, could be related to recent alcoholism. He is not symptomatic. I will proceed with treatment without dose adjustment.

## 2016-01-03 NOTE — Assessment & Plan Note (Signed)
Likely related to recent alcoholism. I will proceed with treatment.

## 2016-01-03 NOTE — Progress Notes (Signed)
[facility] OFFICE PROGRESS NOTE  Patient Care Team: No Pcp Per Patient as PCP - General (General Practice) [name], MD as Consulting Physician (Otolaryngology) [name], MD as Attending Physician (Radiation Oncology) [name], MD as Consulting Physician (Hematology and Oncology) Leota Sauers, RN as Oncology Nurse Navigator Lenn Cal, DDS as Consulting Physician (Dentistry)  SUMMARY OF ONCOLOGIC HISTORY: Oncology History   Carcinoma of supraglottis   Staging form: Larynx - Supraglottis, AJCC 7th Edition     Clinical stage from 03/13/2015: Stage IVA (T3, N2b, M0) - Signed by [name], MD on 03/13/2015     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[place])   01/22/2015 Imaging CT scan showed enhancing infiltrative mass centered at the right piriform sinus with bilateral neck lymphadenopathy   02/22/2015 - 03/08/2015 Hospital Admission He was admitted to the hospital for management of advanced supraglottic cancer status post tracheostomy, biopsy, dental extraction, placement of feeding tube and subsequent discharge to skilled nursing facility   02/22/2015 Pathology Results Accession: [id number] 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   03/09/2015 PET scan 1)  Right piriform sinus mass with right level 2 nodal metastasis.  2)  Hypermetabolic thoracic nodes which are at least partially felt to be reactive.  3)  No evidence of subdiaphragmatic metastatic disease.    03/16/2015 Procedure He has placement of port   03/21/2015 - 05/04/2015 Chemotherapy He received high dose cisplatin x 3 cycles   03/21/2015 - 05/09/2015 Radiation Therapy Tomotherapy to supraglottic tumor and bilateral neck:  70 Gy in 35 fractions to gross disease, 63 Gy in 35 fractions to high risk nodal echelons, and 56 Gy in 35 fractions to intermediate risk nodal echelons.   05/09/2015 Miscellaneous He went  to the ED with fall   06/20/2015 Procedure Tracheostomy was removed   09/06/2015 - 09/08/2015 Hospital Admission  he was admitted to the hospital with alcohol intoxication, dehydration and pain. CT scan of the chest show bilateral metastatic disease to the lung   10/01/2015 Imaging  PET CT scan showed diffuse, multiple hypermetabolic activity in both lungs suspicious for metastatic cancer   10/01/2015 Adverse Reaction he received cetuximab, complicated by anaphylactic reaction   10/01/2015 - 10/03/2015 Hospital Admission He was admitted to the hospital, intubated for cardiac arrest   10/15/2015 - 11/23/2015 Chemotherapy He began palliative chemo with carboplatin and 5FU   10/29/2015 Adverse Reaction 5FU dose reduce by 10% due to nausea and vomiting   11/23/2015 Imaging Ct chest showed disease progression   01/02/2016 -  Chemotherapy He received Keytruda    INTERVAL HISTORY: Please see below for problem oriented charting. He returns for further follow-up. The patient continues to battle with bouts of severe anxiety, depression but denies suicidal ideation. He had significant drinking again 48 hours ago but felt that he is ready to proceed with treatment. Denies any cough, chest pain or shortness of breath.  REVIEW OF SYSTEMS:   Constitutional: Denies fevers, chills or abnormal weight loss Eyes: Denies blurriness of vision Ears, nose, mouth, throat, and face: Denies mucositis or sore throat Respiratory: Denies cough, dyspnea or wheezes Cardiovascular: Denies palpitation, chest discomfort or lower extremity swelling Gastrointestinal:  Denies nausea, heartburn or change in bowel habits Skin: Denies abnormal skin rashes Lymphatics: Denies new lymphadenopathy or easy bruising Neurological:Denies numbness, tingling or new weaknesses Behavioral/Psych: Mood is stable, no new changes  All other systems were reviewed with  the patient and are negative.  I have reviewed the past medical history, past surgical  history, social history and family history with the patient and they are unchanged from previous note.  ALLERGIES:  is allergic to cetuximab.  MEDICATIONS:  Current Outpatient Prescriptions  Medication Sig Dispense Refill  . fludrocortisone (FLORINEF) 0.1 MG tablet Take 2 tablets (0.2 mg total) by mouth daily. 90 tablet 6  . FLUoxetine (PROZAC) 20 MG capsule Take 1 capsule (20 mg total) by mouth daily. 30 capsule 0  . morphine (MSIR) 30 MG tablet Take 1 tablet (30 mg total) by mouth every 6 (six) hours as needed for severe pain. 90 tablet 0  . ondansetron (ZOFRAN) 8 MG tablet Take 1 tablet (8 mg total) by mouth every 8 (eight) hours as needed for nausea. 90 tablet 3  . potassium chloride SA (K-DUR,KLOR-CON) 20 MEQ tablet Take 1 tablet (20 mEq total) by mouth 2 (two) times daily. 60 tablet 1  . prochlorperazine (COMPAZINE) 10 MG tablet Take 1 tablet (10 mg total) by mouth every 6 (six) hours as needed. 90 tablet 3  . senna (SENOKOT) 8.6 MG TABS tablet Take 1 tablet (8.6 mg total) by mouth 2 (two) times daily. (Patient taking differently: Take 1 tablet by mouth 2 (two) times daily as needed for mild constipation (when taking pain medication.). ) 60 each 0  . traZODone (DESYREL) 100 MG tablet Take 1 tablet (100 mg total) by mouth at bedtime as needed for sleep. 30 tablet 3  . triamcinolone cream (KENALOG) 0.1 % Apply topically 2 (two) times daily. 30 g 0   Current Facility-Administered Medications  Medication Dose Route Frequency Provider Last Rate Last Dose  . HYDROmorphone (DILAUDID) injection 2 mg  2 mg Intravenous Q2H PRN [name], MD   2 mg at 01/02/16 1215  . sodium chloride 0.9 % injection 10 mL  10 mL Intracatheter PRN [name], MD   10 mL at 01/02/16 1420   Facility-Administered Medications Ordered in Other Visits  Medication Dose Route Frequency Provider Last Rate Last Dose  . 0.9 %  sodium chloride infusion   Intravenous Once [name], MD      . sodium chloride flush (NS) 0.9 %  injection 10 mL  10 mL Intravenous PRN [name], MD   10 mL at 10/01/15 0956    PHYSICAL EXAMINATION: ECOG PERFORMANCE STATUS: 1 - Symptomatic but completely ambulatory  Filed Vitals:   01/02/16 1129  BP: 97/71  Pulse: 85  Temp: 98.2 F (36.8 C)  Resp: 18   Filed Weights   01/02/16 1129  Weight: 135 lb 14.4 oz (61.644 kg)    GENERAL:alert, no distress and comfortable SKIN: skin color, texture, turgor are normal, no rashes or significant lesions EYES: normal, Conjunctiva are pink and non-injected, sclera clear OROPHARYNX:no exudate, no erythema and lips, buccal mucosa, and tongue normal  NECK: supple, thyroid normal size, non-tender, without nodularity LYMPH:  no palpable lymphadenopathy in the cervical, axillary or inguinal LUNGS: clear to auscultation and percussion with normal breathing effort HEART: regular rate & rhythm and no murmurs and no lower extremity edema ABDOMEN:abdomen soft, non-tender and normal bowel sounds Musculoskeletal:no cyanosis of digits and no clubbing  NEURO: alert & oriented x 3 with fluent speech, no focal motor/sensory deficits  LABORATORY DATA:  I have reviewed the data as listed    Component Value Date/Time   NA 142 01/02/2016 1115   NA 145 12/24/2015 1716   NA 140 07/19/2015   K  3.3* 01/02/2016 1115   K 2.8* 12/24/2015 1716   CL 105 12/24/2015 1716   CO2 32* 01/02/2016 1115   CO2 24 12/24/2015 1716   GLUCOSE 108 01/02/2016 1115   GLUCOSE 52* 12/24/2015 1716   BUN 9.9 01/02/2016 1115   BUN 7 12/24/2015 1716   BUN 10 07/19/2015   CREATININE 0.7 01/02/2016 1115   CREATININE 0.48* 12/24/2015 1716   CREATININE 0.65 08/16/2015 1538   CREATININE 0.6 07/19/2015   CALCIUM 8.8 01/02/2016 1115   CALCIUM 7.9* 12/24/2015 1716   PROT 5.9* 01/02/2016 1115   PROT 6.0* 12/24/2015 1716   ALBUMIN 2.9* 01/02/2016 1115   ALBUMIN 3.0* 12/24/2015 1716   AST 81* 01/02/2016 1115   AST 130* 12/24/2015 1716   ALT 100* 01/02/2016 1115   ALT 44  12/24/2015 1716   ALKPHOS 111 01/02/2016 1115   ALKPHOS 107 12/24/2015 1716   BILITOT 0.65 01/02/2016 1115   BILITOT 1.2 12/24/2015 1716   GFRNONAA >60 12/24/2015 1716   GFRAA >60 12/24/2015 1716    No results found for: SPEP, UPEP  Lab Results  Component Value Date   WBC 2.4* 01/02/2016   NEUTROABS 1.2* 01/02/2016   HGB 10.9* 01/02/2016   HCT 33.4* 01/02/2016   MCV 102.6* 01/02/2016   PLT 127* 01/02/2016      Chemistry      Component Value Date/Time   NA 142 01/02/2016 1115   NA 145 12/24/2015 1716   NA 140 07/19/2015   K 3.3* 01/02/2016 1115   K 2.8* 12/24/2015 1716   CL 105 12/24/2015 1716   CO2 32* 01/02/2016 1115   CO2 24 12/24/2015 1716   BUN 9.9 01/02/2016 1115   BUN 7 12/24/2015 1716   BUN 10 07/19/2015   CREATININE 0.7 01/02/2016 1115   CREATININE 0.48* 12/24/2015 1716   CREATININE 0.65 08/16/2015 1538   CREATININE 0.6 07/19/2015   GLU 82 07/19/2015      Component Value Date/Time   CALCIUM 8.8 01/02/2016 1115   CALCIUM 7.9* 12/24/2015 1716   ALKPHOS 111 01/02/2016 1115   ALKPHOS 107 12/24/2015 1716   AST 81* 01/02/2016 1115   AST 130* 12/24/2015 1716   ALT 100* 01/02/2016 1115   ALT 44 12/24/2015 1716   BILITOT 0.65 01/02/2016 1115   BILITOT 1.2 12/24/2015 1716      ASSESSMENT & PLAN:  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[place]) The patient has delay in initiation of treatment due to reoccurrence of alcoholism and significant depression. He has follow with psychiatrist recently and has pledged to refrain from alcoholism. His last alcohol intake was 48 hours ago. We discussed the risks, benefits, side effects of Keytruda and he agreed to proceed.   Pancytopenia due to antineoplastic chemotherapy [facility]) It is hard to believe that the pancytopenia now could be related to prior treatment. More than likely, could be related to recent alcoholism. He is not symptomatic. I will proceed with treatment without dose adjustment.  Malignant neoplasm metastatic to  lung [facility]) He is not symptomatic from lung metastasis. Observe only.  Alcohol use disorder, severe, dependence ([place]) He has recurrence of alcohol abuse recently. We spent a lot of time discussing about the risk of persistent, recurrent alcohol abuse. The patient will try to refrain from drinking alcohol in the future  Major depressive disorder, recurrent episode, moderate ([place])  He felt that his depression is not under control but he is not suicidal. He will continue medication as prescribed by recent psychiatrist I will make sure he gets  a follow-up appointment for medication adjustment   Elevated liver enzymes Likely related to recent alcoholism. I will proceed with treatment.   No orders of the defined types were placed in this encounter.   All questions were answered. The patient knows to call the clinic with any problems, questions or concerns. No barriers to learning was detected. I spent 25 minutes counseling the patient face to face. The total time spent in the appointment was 40 minutes and more than 50% was on counseling and review of test results     [facility], [place], MD 01/03/2016 11:23 AM

## 2016-01-03 NOTE — Assessment & Plan Note (Signed)
He has recurrence of alcohol abuse recently. We spent a lot of time discussing about the risk of persistent, recurrent alcohol abuse. The patient will try to refrain from drinking alcohol in the future

## 2016-01-03 NOTE — Assessment & Plan Note (Signed)
The patient has delay in initiation of treatment due to reoccurrence of alcoholism and significant depression. He has follow with psychiatrist recently and has pledged to refrain from alcoholism. His last alcohol intake was 48 hours ago. We discussed the risks, benefits, side effects of Keytruda and he agreed to proceed.

## 2016-01-03 NOTE — Telephone Encounter (Signed)
-----   Message from Herschell Dimes, RN sent at 01/02/2016  3:12 PM EDT ----- Regarding: Alvy Bimler - first time Keytruda follow up Contact: [phone] Dr Alvy Bimler patients [facility] [name] tolerated his [place] well. [phone]

## 2016-01-03 NOTE — Telephone Encounter (Signed)
Called Marvin [name] for chemotherapy F/U.  Left message for pt to call if they have any questions or concerns or any new symptoms to report, along with the phone number.

## 2016-01-03 NOTE — Assessment & Plan Note (Signed)
He is not symptomatic from lung metastasis. Observe only.

## 2016-01-03 NOTE — Telephone Encounter (Signed)
Pt returned call for chemo follow up.  Stated he is doing fine, no problems experienced.  Stated good appetite and drinking lots of fluids as tolerated, bowel and bladder function fine, denied pain, in good spirits.  Pt understood to call office with any new problems.

## 2016-01-03 NOTE — Assessment & Plan Note (Signed)
He felt that his depression is not under control but he is not suicidal. He will continue medication as prescribed by recent psychiatrist I will make sure he gets a follow-up appointment for medication adjustment

## 2016-01-23 ENCOUNTER — Ambulatory Visit (HOSPITAL_BASED_OUTPATIENT_CLINIC_OR_DEPARTMENT_OTHER): Payer: Medicaid Other | Admitting: Hematology and Oncology

## 2016-01-23 ENCOUNTER — Other Ambulatory Visit (HOSPITAL_BASED_OUTPATIENT_CLINIC_OR_DEPARTMENT_OTHER): Payer: Medicaid Other

## 2016-01-23 ENCOUNTER — Telehealth: Payer: Self-pay | Admitting: Hematology

## 2016-01-23 ENCOUNTER — Telehealth: Payer: Self-pay | Admitting: *Deleted

## 2016-01-23 ENCOUNTER — Ambulatory Visit (HOSPITAL_BASED_OUTPATIENT_CLINIC_OR_DEPARTMENT_OTHER): Payer: Medicaid Other

## 2016-01-23 ENCOUNTER — Ambulatory Visit: Payer: Medicaid Other | Admitting: Nutrition

## 2016-01-23 ENCOUNTER — Ambulatory Visit: Payer: Medicaid Other

## 2016-01-23 VITALS — BP 101/62 | HR 62 | Temp 97.7°F | Resp 17 | Ht 73.0 in | Wt 135.4 lb

## 2016-01-23 DIAGNOSIS — K1231 Oral mucositis (ulcerative) due to antineoplastic therapy: Secondary | ICD-10-CM

## 2016-01-23 DIAGNOSIS — C321 Malignant neoplasm of supraglottis: Secondary | ICD-10-CM | POA: Diagnosis not present

## 2016-01-23 DIAGNOSIS — E43 Unspecified severe protein-calorie malnutrition: Secondary | ICD-10-CM | POA: Diagnosis not present

## 2016-01-23 DIAGNOSIS — G8929 Other chronic pain: Secondary | ICD-10-CM | POA: Diagnosis not present

## 2016-01-23 DIAGNOSIS — I951 Orthostatic hypotension: Secondary | ICD-10-CM

## 2016-01-23 DIAGNOSIS — D61810 Antineoplastic chemotherapy induced pancytopenia: Secondary | ICD-10-CM | POA: Diagnosis not present

## 2016-01-23 DIAGNOSIS — Z5112 Encounter for antineoplastic immunotherapy: Secondary | ICD-10-CM | POA: Diagnosis not present

## 2016-01-23 DIAGNOSIS — T451X5A Adverse effect of antineoplastic and immunosuppressive drugs, initial encounter: Secondary | ICD-10-CM

## 2016-01-23 DIAGNOSIS — F331 Major depressive disorder, recurrent, moderate: Secondary | ICD-10-CM | POA: Diagnosis not present

## 2016-01-23 LAB — CBC WITH DIFFERENTIAL/PLATELET
BASO%: 0.7 % (ref 0.0–2.0)
Basophils Absolute: 0 10*3/uL (ref 0.0–0.1)
EOS%: 3.7 % (ref 0.0–7.0)
Eosinophils Absolute: 0.1 10*3/uL (ref 0.0–0.5)
HEMATOCRIT: 30.3 % — AB (ref 38.4–49.9)
HGB: 9.9 g/dL — ABNORMAL LOW (ref 13.0–17.1)
LYMPH#: 0.6 10*3/uL — AB (ref 0.9–3.3)
LYMPH%: 14.3 % (ref 14.0–49.0)
MCH: 33.7 pg — ABNORMAL HIGH (ref 27.2–33.4)
MCHC: 32.7 g/dL (ref 32.0–36.0)
MCV: 102.9 fL — AB (ref 79.3–98.0)
MONO#: 0.5 10*3/uL (ref 0.1–0.9)
MONO%: 12.6 % (ref 0.0–14.0)
NEUT%: 68.7 % (ref 39.0–75.0)
NEUTROS ABS: 2.7 10*3/uL (ref 1.5–6.5)
PLATELETS: 151 10*3/uL (ref 140–400)
RBC: 2.94 10*6/uL — ABNORMAL LOW (ref 4.20–5.82)
RDW: 15.2 % — ABNORMAL HIGH (ref 11.0–14.6)
WBC: 3.9 10*3/uL — AB (ref 4.0–10.3)

## 2016-01-23 LAB — COMPREHENSIVE METABOLIC PANEL
ALBUMIN: 3.4 g/dL — AB (ref 3.5–5.0)
ALK PHOS: 95 U/L (ref 40–150)
ALT: 15 U/L (ref 0–55)
ANION GAP: 7 meq/L (ref 3–11)
AST: 23 U/L (ref 5–34)
BILIRUBIN TOTAL: 0.46 mg/dL (ref 0.20–1.20)
BUN: 16.5 mg/dL (ref 7.0–26.0)
CALCIUM: 9.2 mg/dL (ref 8.4–10.4)
CO2: 28 mEq/L (ref 22–29)
CREATININE: 0.7 mg/dL (ref 0.7–1.3)
Chloride: 104 mEq/L (ref 98–109)
EGFR: >90 mL/min/{1.73_m2} (ref >90)
Glucose: 128 mg/dl (ref 70–140)
Potassium: 4.1 mEq/L (ref 3.5–5.1)
Sodium: 139 mEq/L (ref 136–145)
TOTAL PROTEIN: 6.5 g/dL (ref 6.4–8.3)

## 2016-01-23 MED ORDER — SODIUM CHLORIDE 0.9 % IJ SOLN
10.0000 mL | INTRAMUSCULAR | Status: AC | PRN
  Administered 2016-01-23: 10 mL
  Filled 2016-01-23: qty 10

## 2016-01-23 MED ORDER — HYDROMORPHONE HCL 4 MG/ML IJ SOLN
2.0000 mg | INTRAMUSCULAR | Status: DC | PRN
  Administered 2016-01-23: 2 mg via INTRAVENOUS

## 2016-01-23 MED ORDER — SODIUM CHLORIDE 0.9 % IV SOLN
200.0000 mg | Freq: Once | INTRAVENOUS | Status: AC
  Administered 2016-01-23: 200 mg via INTRAVENOUS
  Filled 2016-01-23: qty 8

## 2016-01-23 MED ORDER — HEPARIN SOD (PORK) LOCK FLUSH 100 UNIT/ML IV SOLN
500.0000 [IU] | Freq: Once | INTRAVENOUS | Status: AC | PRN
  Administered 2016-01-23: 500 [IU]
  Filled 2016-01-23: qty 5

## 2016-01-23 MED ORDER — SODIUM CHLORIDE 0.9 % IV SOLN
Freq: Once | INTRAVENOUS | Status: AC
  Administered 2016-01-23: 10:00:00 via INTRAVENOUS
  Filled 2016-01-23: qty 4

## 2016-01-23 MED ORDER — SODIUM CHLORIDE 0.9% FLUSH
10.0000 mL | INTRAVENOUS | Status: DC | PRN
  Administered 2016-01-23: 10 mL
  Filled 2016-01-23: qty 10

## 2016-01-23 MED ORDER — TRAZODONE HCL 100 MG PO TABS: 100.0000 mg | ORAL_TABLET | Freq: Every evening | ORAL | Status: AC | PRN

## 2016-01-23 MED ORDER — SODIUM CHLORIDE 0.9 % IV SOLN
Freq: Once | INTRAVENOUS | Status: AC
  Administered 2016-01-23: 10:00:00 via INTRAVENOUS

## 2016-01-23 MED ORDER — HYDROMORPHONE HCL 4 MG/ML IJ SOLN
INTRAMUSCULAR | Status: AC
  Filled 2016-01-23: qty 1

## 2016-01-23 MED FILL — traZODone HCL 100 MG TABS: 100 | 30 days supply | Qty: 30 | Fill #0

## 2016-01-23 NOTE — Patient Instructions (Signed)
Pembrolizumab injection What is this medicine? PEMBROLIZUMAB (pem broe liz ue mab) is a monoclonal antibody. It is used to treat melanoma and non-small cell lung cancer. This medicine may be used for other purposes; ask your health care provider or pharmacist if you have questions. What should I tell my health care provider before I take this medicine? They need to know if you have any of these conditions: -diabetes -immune system problems -inflammatory bowel disease -liver disease -lung or breathing disease -lupus -an unusual or allergic reaction to pembrolizumab, other medicines, foods, dyes, or preservatives -pregnant or trying to get pregnant -breast-feeding How should I use this medicine? This medicine is for infusion into a vein. It is given by a health care professional in a hospital or clinic setting. A special MedGuide will be given to you before each treatment. Be sure to read this information carefully each time. Talk to your pediatrician regarding the use of this medicine in children. Special care may be needed. Overdosage: If you think you have taken too much of this medicine contact a poison control center or emergency room at once. NOTE: This medicine is only for you. Do not share this medicine with others. What if I miss a dose? It is important not to miss your dose. Call your doctor or health care professional if you are unable to keep an appointment. What may interact with this medicine? Interactions have not been studied. Give your health care provider a list of all the medicines, herbs, non-prescription drugs, or dietary supplements you use. Also tell them if you smoke, drink alcohol, or use illegal drugs. Some items may interact with your medicine. This list may not describe all possible interactions. Give your health care provider a list of all the medicines, herbs, non-prescription drugs, or dietary supplements you use. Also tell them if you smoke, drink alcohol, or  use illegal drugs. Some items may interact with your medicine. What should I watch for while using this medicine? Your condition will be monitored carefully while you are receiving this medicine. You may need blood work done while you are taking this medicine. Do not become pregnant while taking this medicine or for 4 months after stopping it. Women should inform their doctor if they wish to become pregnant or think they might be pregnant. There is a potential for serious side effects to an unborn child. Talk to your health care professional or pharmacist for more information. Do not breast-feed an infant while taking this medicine or for 4 months after the last dose. What side effects may I notice from receiving this medicine? Side effects that you should report to your doctor or health care professional as soon as possible: -allergic reactions like skin rash, itching or hives, swelling of the face, lips, or tongue -bloody or black, tarry stools -breathing problems -change in the amount of urine -changes in vision -chest pain -chills -dark urine -dizziness or feeling faint or lightheaded -fast or irregular heartbeat -fever -flushing -hair loss -muscle pain -muscle weakness -persistent headache -signs and symptoms of high blood sugar such as dizziness; dry mouth; dry skin; fruity breath; nausea; stomach pain; increased hunger or thirst; increased urination -signs and symptoms of liver injury like dark urine, light-colored stools, loss of appetite, nausea, right upper belly pain, yellowing of the eyes or skin -stomach pain -weight loss Side effects that usually do not require medical attention (Report these to your doctor or health care professional if they continue or are bothersome.):constipation -cough -diarrhea -joint pain -  tiredness This list may not describe all possible side effects. Call your doctor for medical advice about side effects. You may report side effects to FDA at  1-800-FDA-1088. Where should I keep my medicine? This drug is given in a hospital or clinic and will not be stored at home. NOTE: This sheet is a summary. It may not cover all possible information. If you have questions about this medicine, talk to your doctor, pharmacist, or health care provider.    2016, [employer] Standard. (2014-10-03 17:24:19)  

## 2016-01-23 NOTE — Telephone Encounter (Signed)
Per staff message and POF I have scheduled appts. Advised scheduler of appts. JMW  

## 2016-01-23 NOTE — Progress Notes (Signed)
Nutrition follow-up completed with patient during infusion. Patient receiving Keytruda for laryngeal cancer. Patient no longer has a feeding tube or trach. Weight was documented as 135.4 pounds on June 7 stable from 135.4 pounds December 5. Patient reports he is eating well and consuming 3 Ensure Plus daily. Denies problems with nausea, vomiting, constipation or diarrhea.  Nutrition diagnosis: Unintended weight loss improved.  Intervention: Educated patient to consume high-calorie high-protein foods along with oral nutrition supplements as tolerated. Provided additional samples of oral nutrition supplements and coupons. Encouraged patient to contact me for questions or concerns.  Monitoring, evaluation, goals: Patient will continue increased calories and protein to promote weight maintenance.  Next visit: Wednesday, June 28, during infusion.  **Disclaimer: This note was dictated with voice recognition software. Similar sounding words can inadvertently be transcribed and this note may contain transcription errors which may not have been corrected upon publication of note.**

## 2016-01-23 NOTE — Telephone Encounter (Signed)
per pof to sch pt appt-gave pt copy of avs °

## 2016-01-23 NOTE — Patient Instructions (Signed)

## 2016-01-24 ENCOUNTER — Other Ambulatory Visit: Payer: Self-pay | Admitting: Hematology and Oncology

## 2016-01-24 ENCOUNTER — Other Ambulatory Visit: Payer: Self-pay

## 2016-01-24 ENCOUNTER — Ambulatory Visit: Payer: Self-pay | Admitting: Hematology and Oncology

## 2016-01-24 ENCOUNTER — Encounter: Payer: Self-pay | Admitting: Hematology and Oncology

## 2016-01-24 NOTE — Assessment & Plan Note (Signed)
He tolerated treatment well without any side effects. We will continue treatment without delay. I will order imaging study after 3 cycles of therapy

## 2016-01-24 NOTE — Assessment & Plan Note (Signed)
He felt that his depression is better controlled with recent changes of his antidepressants and he is not suicidal. He will continue medication as prescribed by recent psychiatrist I will make sure he gets a follow-up appointment for medication adjustment

## 2016-01-24 NOTE — Assessment & Plan Note (Signed)
His blood pressure is low today  He will continue on Florinef and are recommended increase oral intake as tolerated

## 2016-01-24 NOTE — Assessment & Plan Note (Signed)
It is hard to believe that the pancytopenia now could be related to prior treatment. More than likely, could be related to recent alcoholism. He is not symptomatic. I will proceed with treatment without dose adjustment.

## 2016-01-24 NOTE — Assessment & Plan Note (Signed)
He is eating better since the last time I saw him  He is able to swallow most foods by mouth. I continue to encourage him to increase oral intake as tolerated

## 2016-01-24 NOTE — Progress Notes (Signed)
[facility] OFFICE PROGRESS NOTE  Patient Care Team: No Pcp Per Patient as PCP - General (General Practice) [name], MD as Consulting Physician (Otolaryngology) [name], MD as Attending Physician (Radiation Oncology) [name], MD as Consulting Physician (Hematology and Oncology) Leota Sauers, RN as Oncology Nurse Navigator Lenn Cal, DDS as Consulting Physician (Dentistry)  SUMMARY OF ONCOLOGIC HISTORY: Oncology History   Carcinoma of supraglottis   Staging form: Larynx - Supraglottis, AJCC 7th Edition     Clinical stage from 03/13/2015: Stage IVA (T3, N2b, M0) - Signed by [name], MD on 03/13/2015     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[place])   01/22/2015 Imaging CT scan showed enhancing infiltrative mass centered at the right piriform sinus with bilateral neck lymphadenopathy   02/22/2015 - 03/08/2015 Hospital Admission He was admitted to the hospital for management of advanced supraglottic cancer status post tracheostomy, biopsy, dental extraction, placement of feeding tube and subsequent discharge to skilled nursing facility   02/22/2015 Pathology Results Accession: [id number] 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   03/09/2015 PET scan 1)  Right piriform sinus mass with right level 2 nodal metastasis.  2)  Hypermetabolic thoracic nodes which are at least partially felt to be reactive.  3)  No evidence of subdiaphragmatic metastatic disease.    03/16/2015 Procedure He has placement of port   03/21/2015 - 05/04/2015 Chemotherapy He received high dose cisplatin x 3 cycles   03/21/2015 - 05/09/2015 Radiation Therapy Tomotherapy to supraglottic tumor and bilateral neck:  70 Gy in 35 fractions to gross disease, 63 Gy in 35 fractions to high risk nodal echelons, and 56 Gy in 35 fractions to intermediate risk nodal echelons.   05/09/2015 Miscellaneous He went  to the ED with fall   06/20/2015 Procedure Tracheostomy was removed   09/06/2015 - 09/08/2015 Hospital Admission  he was admitted to the hospital with alcohol intoxication, dehydration and pain. CT scan of the chest show bilateral metastatic disease to the lung   10/01/2015 Imaging  PET CT scan showed diffuse, multiple hypermetabolic activity in both lungs suspicious for metastatic cancer   10/01/2015 Adverse Reaction he received cetuximab, complicated by anaphylactic reaction   10/01/2015 - 10/03/2015 Hospital Admission He was admitted to the hospital, intubated for cardiac arrest   10/15/2015 - 11/23/2015 Chemotherapy He began palliative chemo with carboplatin and 5FU   10/29/2015 Adverse Reaction 5FU dose reduce by 10% due to nausea and vomiting   11/23/2015 Imaging Ct chest showed disease progression   01/02/2016 -  Chemotherapy He received Keytruda    INTERVAL HISTORY: Please see below for problem oriented charting. He returns for cycle 2 of treatment. He tolerated treatment very well without any side effects such as mouth sores, nausea, breathing difficulties or diarrhea He is compliant with treatment recommendation. He has recent dose change for antidepressant and denies worsening control of his mood disorder or suicidal ideation. He has not drink any alcohol for the past month. Denies recent tobacco abuse. Overall, he feels well.  REVIEW OF SYSTEMS:   Constitutional: Denies fevers, chills or abnormal weight loss Eyes: Denies blurriness of vision Ears, nose, mouth, throat, and face: Denies mucositis or sore throat Respiratory: Denies cough, dyspnea or wheezes Cardiovascular: Denies palpitation, chest discomfort or lower extremity swelling Gastrointestinal:  Denies nausea, heartburn or change in bowel habits Skin: Denies abnormal skin rashes Lymphatics: Denies new lymphadenopathy or easy bruising  Neurological:Denies numbness, tingling or new weaknesses Behavioral/Psych: Mood is stable, no new  changes  All other systems were reviewed with the patient and are negative.  I have reviewed the past medical history, past surgical history, social history and family history with the patient and they are unchanged from previous note.  ALLERGIES:  is allergic to cetuximab.  MEDICATIONS:  Current Outpatient Prescriptions  Medication Sig Dispense Refill  . fludrocortisone (FLORINEF) 0.1 MG tablet Take 2 tablets (0.2 mg total) by mouth daily. 90 tablet 6  . FLUoxetine (PROZAC) 40 MG capsule Take 40 mg by mouth daily.    Marland Kitchen morphine (MSIR) 30 MG tablet Take 1 tablet (30 mg total) by mouth every 6 (six) hours as needed for severe pain. 90 tablet 0  . ondansetron (ZOFRAN) 8 MG tablet Take 1 tablet (8 mg total) by mouth every 8 (eight) hours as needed for nausea. 90 tablet 3  . potassium chloride SA (K-DUR,KLOR-CON) 20 MEQ tablet Take 1 tablet (20 mEq total) by mouth 2 (two) times daily. 60 tablet 1  . prochlorperazine (COMPAZINE) 10 MG tablet Take 1 tablet (10 mg total) by mouth every 6 (six) hours as needed. 90 tablet 3  . senna (SENOKOT) 8.6 MG TABS tablet Take 1 tablet (8.6 mg total) by mouth 2 (two) times daily. (Patient taking differently: Take 1 tablet by mouth 2 (two) times daily as needed for mild constipation (when taking pain medication.). ) 60 each 0  . traZODone (DESYREL) 100 MG tablet Take 1 tablet (100 mg total) by mouth at bedtime as needed for sleep. 30 tablet 3  . triamcinolone cream (KENALOG) 0.1 % Apply topically 2 (two) times daily. 30 g 0   No current facility-administered medications for this visit.   Facility-Administered Medications Ordered in Other Visits  Medication Dose Route Frequency Provider Last Rate Last Dose  . 0.9 %  sodium chloride infusion   Intravenous Once [name], MD      . sodium chloride flush (NS) 0.9 % injection 10 mL  10 mL Intravenous PRN [name], MD   10 mL at 10/01/15 0956    PHYSICAL EXAMINATION: ECOG PERFORMANCE STATUS: 0 -  Asymptomatic  Filed Vitals:   01/23/16 0850  BP: 101/62  Pulse: 62  Temp: 97.7 F (36.5 C)  Resp: 17   Filed Weights   01/23/16 0850  Weight: 135 lb 6.4 oz (61.417 kg)    GENERAL:alert, no distress and comfortable SKIN: skin color, texture, turgor are normal, no rashes or significant lesions EYES: normal, Conjunctiva are pink and non-injected, sclera clear OROPHARYNX:no exudate, no erythema and lips, buccal mucosa, and tongue normal  NECK: supple, thyroid normal size, non-tender, without nodularity LYMPH:  no palpable lymphadenopathy in the cervical, axillary or inguinal LUNGS: clear to auscultation and percussion with normal breathing effort HEART: regular rate & rhythm and no murmurs and no lower extremity edema ABDOMEN:abdomen soft, non-tender and normal bowel sounds Musculoskeletal:no cyanosis of digits and no clubbing  NEURO: alert & oriented x 3 with fluent speech, no focal motor/sensory deficits  LABORATORY DATA:  I have reviewed the data as listed    Component Value Date/Time   NA 139 01/23/2016 0829   NA 145 12/24/2015 1716   NA 140 07/19/2015   K 4.1 01/23/2016 0829   K 2.8* 12/24/2015 1716   CL 105 12/24/2015 1716   CO2 28 01/23/2016 0829   CO2 24 12/24/2015 1716   GLUCOSE 128 01/23/2016 0829   GLUCOSE 52* 12/24/2015 1716  BUN 16.5 01/23/2016 0829   BUN 7 12/24/2015 1716   BUN 10 07/19/2015   CREATININE 0.7 01/23/2016 0829   CREATININE 0.48* 12/24/2015 1716   CREATININE 0.65 08/16/2015 1538   CREATININE 0.6 07/19/2015   CALCIUM 9.2 01/23/2016 0829   CALCIUM 7.9* 12/24/2015 1716   PROT 6.5 01/23/2016 0829   PROT 6.0* 12/24/2015 1716   ALBUMIN 3.4* 01/23/2016 0829   ALBUMIN 3.0* 12/24/2015 1716   AST 23 01/23/2016 0829   AST 130* 12/24/2015 1716   ALT 15 01/23/2016 0829   ALT 44 12/24/2015 1716   ALKPHOS 95 01/23/2016 0829   ALKPHOS 107 12/24/2015 1716   BILITOT 0.46 01/23/2016 0829   BILITOT 1.2 12/24/2015 1716   GFRNONAA >60 12/24/2015 1716    GFRAA >60 12/24/2015 1716    No results found for: SPEP, UPEP  Lab Results  Component Value Date   WBC 3.9* 01/23/2016   NEUTROABS 2.7 01/23/2016   HGB 9.9* 01/23/2016   HCT 30.3* 01/23/2016   MCV 102.9* 01/23/2016   PLT 151 01/23/2016      Chemistry      Component Value Date/Time   NA 139 01/23/2016 0829   NA 145 12/24/2015 1716   NA 140 07/19/2015   K 4.1 01/23/2016 0829   K 2.8* 12/24/2015 1716   CL 105 12/24/2015 1716   CO2 28 01/23/2016 0829   CO2 24 12/24/2015 1716   BUN 16.5 01/23/2016 0829   BUN 7 12/24/2015 1716   BUN 10 07/19/2015   CREATININE 0.7 01/23/2016 0829   CREATININE 0.48* 12/24/2015 1716   CREATININE 0.65 08/16/2015 1538   CREATININE 0.6 07/19/2015   GLU 82 07/19/2015      Component Value Date/Time   CALCIUM 9.2 01/23/2016 0829   CALCIUM 7.9* 12/24/2015 1716   ALKPHOS 95 01/23/2016 0829   ALKPHOS 107 12/24/2015 1716   AST 23 01/23/2016 0829   AST 130* 12/24/2015 1716   ALT 15 01/23/2016 0829   ALT 44 12/24/2015 1716   BILITOT 0.46 01/23/2016 0829   BILITOT 1.2 12/24/2015 1716      ASSESSMENT & PLAN:  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[place]) He tolerated treatment well without any side effects. We will continue treatment without delay. I will order imaging study after 3 cycles of therapy  Pancytopenia due to antineoplastic chemotherapy [facility]) It is hard to believe that the pancytopenia now could be related to prior treatment. More than likely, could be related to recent alcoholism. He is not symptomatic. I will proceed with treatment without dose adjustment.   Protein-calorie malnutrition, severe ([place]) He is eating better since the last time I saw him  He is able to swallow most foods by mouth. I continue to encourage him to increase oral intake as tolerated    Orthostatic hypotension His blood pressure is low today  He will continue on Florinef and are recommended increase oral intake as tolerated  Major depressive disorder, recurrent  episode, moderate ([place])  He felt that his depression is better controlled with recent changes of his antidepressants and he is not suicidal. He will continue medication as prescribed by recent psychiatrist I will make sure he gets a follow-up appointment for medication adjustment     Chronic pain He felt that pain is under control today. He will continue to use immediate release morphine as needed only for pain     No orders of the defined types were placed in this encounter.   All questions were answered. The patient knows to  call the clinic with any problems, questions or concerns. No barriers to learning was detected. I spent 25 minutes counseling the patient face to face. The total time spent in the appointment was 30 minutes and more than 50% was on counseling and review of test results     [facility], [place], MD 01/24/2016 8:50 AM

## 2016-01-24 NOTE — Assessment & Plan Note (Signed)
He felt that pain is under control today. He will continue to use immediate release morphine as needed only for pain

## 2016-02-13 ENCOUNTER — Encounter: Payer: Self-pay | Admitting: Hematology and Oncology

## 2016-02-13 ENCOUNTER — Encounter: Payer: Self-pay | Admitting: *Deleted

## 2016-02-13 ENCOUNTER — Other Ambulatory Visit (HOSPITAL_BASED_OUTPATIENT_CLINIC_OR_DEPARTMENT_OTHER): Payer: Medicaid Other

## 2016-02-13 ENCOUNTER — Ambulatory Visit: Payer: Medicaid Other

## 2016-02-13 ENCOUNTER — Ambulatory Visit: Payer: Medicaid Other | Admitting: Nutrition

## 2016-02-13 ENCOUNTER — Telehealth: Payer: Self-pay | Admitting: Hematology and Oncology

## 2016-02-13 ENCOUNTER — Ambulatory Visit (HOSPITAL_BASED_OUTPATIENT_CLINIC_OR_DEPARTMENT_OTHER): Payer: Medicaid Other

## 2016-02-13 ENCOUNTER — Ambulatory Visit (HOSPITAL_BASED_OUTPATIENT_CLINIC_OR_DEPARTMENT_OTHER): Payer: Medicaid Other | Admitting: Hematology and Oncology

## 2016-02-13 VITALS — BP 101/67 | HR 66 | Temp 97.7°F | Resp 18 | Ht 73.0 in | Wt 135.3 lb

## 2016-02-13 DIAGNOSIS — G8929 Other chronic pain: Secondary | ICD-10-CM | POA: Diagnosis not present

## 2016-02-13 DIAGNOSIS — D630 Anemia in neoplastic disease: Secondary | ICD-10-CM

## 2016-02-13 DIAGNOSIS — C321 Malignant neoplasm of supraglottis: Secondary | ICD-10-CM

## 2016-02-13 DIAGNOSIS — R5383 Other fatigue: Secondary | ICD-10-CM

## 2016-02-13 DIAGNOSIS — Z5112 Encounter for antineoplastic immunotherapy: Secondary | ICD-10-CM

## 2016-02-13 DIAGNOSIS — F331 Major depressive disorder, recurrent, moderate: Secondary | ICD-10-CM

## 2016-02-13 DIAGNOSIS — I951 Orthostatic hypotension: Secondary | ICD-10-CM | POA: Diagnosis not present

## 2016-02-13 DIAGNOSIS — E43 Unspecified severe protein-calorie malnutrition: Secondary | ICD-10-CM

## 2016-02-13 DIAGNOSIS — D638 Anemia in other chronic diseases classified elsewhere: Secondary | ICD-10-CM

## 2016-02-13 DIAGNOSIS — Z79899 Other long term (current) drug therapy: Secondary | ICD-10-CM | POA: Diagnosis not present

## 2016-02-13 DIAGNOSIS — C7800 Secondary malignant neoplasm of unspecified lung: Secondary | ICD-10-CM

## 2016-02-13 LAB — CBC WITH DIFFERENTIAL/PLATELET
BASO%: 0.2 % (ref 0.0–2.0)
BASOS ABS: 0 10*3/uL (ref 0.0–0.1)
EOS ABS: 0.1 10*3/uL (ref 0.0–0.5)
EOS%: 2 % (ref 0.0–7.0)
HCT: 31.8 % — ABNORMAL LOW (ref 38.4–49.9)
HGB: 10.5 g/dL — ABNORMAL LOW (ref 13.0–17.1)
LYMPH%: 12.7 % — AB (ref 14.0–49.0)
MCH: 32.9 pg (ref 27.2–33.4)
MCHC: 33 g/dL (ref 32.0–36.0)
MCV: 99.7 fL — AB (ref 79.3–98.0)
MONO#: 0.5 10*3/uL (ref 0.1–0.9)
MONO%: 12.3 % (ref 0.0–14.0)
NEUT#: 3 10*3/uL (ref 1.5–6.5)
NEUT%: 72.8 % (ref 39.0–75.0)
PLATELETS: 187 10*3/uL (ref 140–400)
RBC: 3.19 10*6/uL — AB (ref 4.20–5.82)
RDW: 12.6 % (ref 11.0–14.6)
WBC: 4.1 10*3/uL (ref 4.0–10.3)
lymph#: 0.5 10*3/uL — ABNORMAL LOW (ref 0.9–3.3)

## 2016-02-13 LAB — COMPREHENSIVE METABOLIC PANEL
AST: 18 U/L (ref 5–34)
Albumin: 3.4 g/dL — ABNORMAL LOW (ref 3.5–5.0)
Alkaline Phosphatase: 109 U/L (ref 40–150)
Anion Gap: 8 mEq/L (ref 3–11)
BUN: 15.2 mg/dL (ref 7.0–26.0)
CALCIUM: 9.2 mg/dL (ref 8.4–10.4)
CHLORIDE: 101 meq/L (ref 98–109)
CO2: 30 meq/L — AB (ref 22–29)
CREATININE: 0.7 mg/dL (ref 0.7–1.3)
EGFR: >90 mL/min/{1.73_m2} (ref >90)
GLUCOSE: 109 mg/dL (ref 70–140)
Potassium: 3.8 mEq/L (ref 3.5–5.1)
SODIUM: 139 meq/L (ref 136–145)
Total Bilirubin: 0.32 mg/dL (ref 0.20–1.20)
Total Protein: 6.6 g/dL (ref 6.4–8.3)

## 2016-02-13 LAB — TSH: TSH: 6.953 m[IU]/L — AB (ref 0.320–4.118)

## 2016-02-13 MED ORDER — SODIUM CHLORIDE 0.9% FLUSH
10.0000 mL | INTRAVENOUS | Status: DC | PRN
  Administered 2016-02-13: 10 mL
  Filled 2016-02-13: qty 10

## 2016-02-13 MED ORDER — HEPARIN SOD (PORK) LOCK FLUSH 100 UNIT/ML IV SOLN
500.0000 [IU] | Freq: Once | INTRAVENOUS | Status: AC | PRN
  Administered 2016-02-13: 500 [IU]
  Filled 2016-02-13: qty 5

## 2016-02-13 MED ORDER — SODIUM CHLORIDE 0.9 % IJ SOLN
10.0000 mL | INTRAMUSCULAR | Status: DC | PRN
  Administered 2016-02-13: 10 mL
  Filled 2016-02-13: qty 10

## 2016-02-13 MED ORDER — LEVOTHYROXINE SODIUM 50 MCG PO TABS: 50.0000 ug | ORAL_TABLET | Freq: Every day | ORAL | Status: DC

## 2016-02-13 MED ORDER — SODIUM CHLORIDE 0.9 % IV SOLN
200.0000 mg | Freq: Once | INTRAVENOUS | Status: AC
  Administered 2016-02-13: 200 mg via INTRAVENOUS
  Filled 2016-02-13: qty 8

## 2016-02-13 MED ORDER — MORPHINE SULFATE 15 MG PO TABS: 15.0000 mg | ORAL_TABLET | Freq: Four times a day (QID) | ORAL | Status: DC | PRN

## 2016-02-13 MED ORDER — SODIUM CHLORIDE 0.9 % IV SOLN
Freq: Once | INTRAVENOUS | Status: AC
  Administered 2016-02-13: 12:00:00 via INTRAVENOUS

## 2016-02-13 MED FILL — LEVOTHYROXINE 50 MCG TABLET: 50 | 30 days supply | Qty: 30 | Fill #0 | Status: TO

## 2016-02-13 MED FILL — MORPHINE SULFATE IR 15 MG T: 15 | 23 days supply | Qty: 90 | Fill #0

## 2016-02-13 NOTE — Assessment & Plan Note (Signed)
He tolerated treatment well without any side effects. We will continue treatment without delay. I will order imaging study after 3 cycles of therapy

## 2016-02-13 NOTE — Assessment & Plan Note (Signed)
He felt that pain is under control today. He will continue to use immediate release morphine as needed only for pain I plan to start morphine taper and reduce his dose to 15 mg prn

## 2016-02-13 NOTE — Progress Notes (Signed)
  Oncology Nurse Navigator Documentation  Navigator Location: [facility]-Med Onc (02/13/16 1105) Navigator Encounter Type: Clinic/MDC (02/13/16 1105)           Patient Visit Type: MedOnc (02/13/16 1105)         To provide support, encouragement and care continuity, met with Marvin [name] during Established Patient appt with Dr. Alvy Bimler.  He was accompanied by his aunt and other family member. He reported:  Not smoking or drinking ETOH.  Neck pain in mornings, occasionally in evenings; taking 15 mg MSIR, obtains relief.  Denies nausea, constipation.  Taking Florinef as prescribed. He voiced understanding that after today's Keytruda, he will have a CT chest and neck with contrast on 7/19. He understands I can be contacted with needs/concerns.  [name], RN, BSN, [place] [facility] [place] [phone]                          Time Spent with Patient: 30 (02/13/16 1105)

## 2016-02-13 NOTE — Patient Instructions (Signed)
[facility] Discharge Instructions for Patients Receiving Chemotherapy  Today you received the following chemotherapy agents Keytruda  To help prevent nausea and vomiting after your treatment, we encourage you to take your nausea medication    If you develop nausea and vomiting that is not controlled by your nausea medication, call the clinic.   BELOW ARE SYMPTOMS THAT SHOULD BE REPORTED IMMEDIATELY:  *FEVER GREATER THAN 100.5 F  *CHILLS WITH OR WITHOUT FEVER  NAUSEA AND VOMITING THAT IS NOT CONTROLLED WITH YOUR NAUSEA MEDICATION  *UNUSUAL SHORTNESS OF BREATH  *UNUSUAL BRUISING OR BLEEDING  TENDERNESS IN MOUTH AND THROAT WITH OR WITHOUT PRESENCE OF ULCERS  *URINARY PROBLEMS  *BOWEL PROBLEMS  UNUSUAL RASH Items with * indicate a potential emergency and should be followed up as soon as possible.  Feel free to call the clinic you have any questions or concerns. The clinic phone number is [phone].  Please show the CHEMO ALERT CARD at check-in to the Emergency Department and triage nurse.   

## 2016-02-13 NOTE — Assessment & Plan Note (Signed)
This is likely due to recent treatment. The patient denies recent history of bleeding such as epistaxis, hematuria or hematochezia. He is asymptomatic from the anemia. I will observe for now.  He does not require transfusion now. I will continue the chemotherapy at current dose without dosage adjustment.  If the anemia gets progressive worse in the future, I might have to delay his treatment or adjust the chemotherapy dose.  

## 2016-02-13 NOTE — Telephone Encounter (Signed)
Gave and printed appt sched and avs for pt for July  °

## 2016-02-13 NOTE — Patient Instructions (Signed)

## 2016-02-13 NOTE — Assessment & Plan Note (Signed)
His blood pressure is low today  He will continue on Florinef and are recommended increase oral intake as tolerated

## 2016-02-13 NOTE — Progress Notes (Signed)
[name] OFFICE PROGRESS NOTE  Patient Care Team: No Pcp Per Patient as PCP - General (General Practice) [name], MD as Consulting Physician (Otolaryngology) [name], MD as Attending Physician (Radiation Oncology) [name], MD as Consulting Physician (Hematology and Oncology) Leota Sauers, RN as Oncology Nurse Navigator Lenn Cal, DDS as Consulting Physician (Dentistry)  SUMMARY OF ONCOLOGIC HISTORY: Oncology History   Carcinoma of supraglottis   Staging form: Larynx - Supraglottis, AJCC 7th Edition     Clinical stage from 03/13/2015: Stage IVA (T3, N2b, M0) - Signed by [name], MD on 03/13/2015     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[place])   01/22/2015 Imaging CT scan showed enhancing infiltrative mass centered at the right piriform sinus with bilateral neck lymphadenopathy   02/22/2015 - 03/08/2015 Hospital Admission He was admitted to the hospital for management of advanced supraglottic cancer status post tracheostomy, biopsy, dental extraction, placement of feeding tube and subsequent discharge to skilled nursing facility   02/22/2015 Pathology Results Accession: [id number] 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   03/09/2015 PET scan 1)  Right piriform sinus mass with right level 2 nodal metastasis.  2)  Hypermetabolic thoracic nodes which are at least partially felt to be reactive.  3)  No evidence of subdiaphragmatic metastatic disease.    03/16/2015 Procedure He has placement of port   03/21/2015 - 05/04/2015 Chemotherapy He received high dose cisplatin x 3 cycles   03/21/2015 - 05/09/2015 Radiation Therapy Tomotherapy to supraglottic tumor and bilateral neck:  70 Gy in 35 fractions to gross disease, 63 Gy in 35 fractions to high risk nodal echelons, and 56 Gy in 35 fractions to intermediate risk nodal echelons.   05/09/2015 Miscellaneous He went  to the ED with fall   06/20/2015 Procedure Tracheostomy was removed   09/06/2015 - 09/08/2015 Hospital Admission  he was admitted to the hospital with alcohol intoxication, dehydration and pain. CT scan of the chest show bilateral metastatic disease to the lung   10/01/2015 Imaging  PET CT scan showed diffuse, multiple hypermetabolic activity in both lungs suspicious for metastatic cancer   10/01/2015 Adverse Reaction he received cetuximab, complicated by anaphylactic reaction   10/01/2015 - 10/03/2015 Hospital Admission He was admitted to the hospital, intubated for cardiac arrest   10/15/2015 - 11/23/2015 Chemotherapy He began palliative chemo with carboplatin and 5FU   10/29/2015 Adverse Reaction 5FU dose reduce by 10% due to nausea and vomiting   11/23/2015 Imaging Ct chest showed disease progression   01/02/2016 -  Chemotherapy He received Keytruda    INTERVAL HISTORY: Please see below for problem oriented charting. He returns for cycle 3 of treatment. He tolerated treatment very well without any side effects such as mouth sores, nausea, breathing difficulties or diarrhea He is compliant with treatment recommendation. He has recent dose change for antidepressant and denies worsening control of his mood disorder or suicidal ideation. He has not drink any alcohol for the past month. Denies recent tobacco abuse. Overall, he feels well.  REVIEW OF SYSTEMS:   Constitutional: Denies fevers, chills or abnormal weight loss Eyes: Denies blurriness of vision Ears, nose, mouth, throat, and face: Denies mucositis or sore throat Respiratory: Denies cough, dyspnea or wheezes Cardiovascular: Denies palpitation, chest discomfort or lower extremity swelling Gastrointestinal:  Denies nausea, heartburn or change in bowel habits Skin: Denies abnormal skin rashes Lymphatics: Denies new lymphadenopathy or easy bruising  Neurological:Denies numbness, tingling or new weaknesses Behavioral/Psych: Mood is stable, no new  changes  All other systems were reviewed with the patient and are negative.  I have reviewed the past medical history, past surgical history, social history and family history with the patient and they are unchanged from previous note.  ALLERGIES:  is allergic to cetuximab.  MEDICATIONS:  Current Outpatient Prescriptions  Medication Sig Dispense Refill  . fludrocortisone (FLORINEF) 0.1 MG tablet Take 2 tablets (0.2 mg total) by mouth daily. 90 tablet 6  . FLUoxetine (PROZAC) 40 MG capsule Take 40 mg by mouth daily.    Marland Kitchen morphine (MSIR) 15 MG tablet Take 1 tablet (15 mg total) by mouth every 6 (six) hours as needed for severe pain. 90 tablet 0  . ondansetron (ZOFRAN) 8 MG tablet Take 1 tablet (8 mg total) by mouth every 8 (eight) hours as needed for nausea. 90 tablet 3  . potassium chloride SA (K-DUR,KLOR-CON) 20 MEQ tablet Take 1 tablet (20 mEq total) by mouth 2 (two) times daily. 60 tablet 1  . prochlorperazine (COMPAZINE) 10 MG tablet Take 1 tablet (10 mg total) by mouth every 6 (six) hours as needed. 90 tablet 3  . senna (SENOKOT) 8.6 MG TABS tablet Take 1 tablet (8.6 mg total) by mouth 2 (two) times daily. (Patient taking differently: Take 1 tablet by mouth 2 (two) times daily as needed for mild constipation (when taking pain medication.). ) 60 each 0  . traZODone (DESYREL) 100 MG tablet Take 1 tablet (100 mg total) by mouth at bedtime as needed for sleep. 30 tablet 3  . triamcinolone cream (KENALOG) 0.1 % Apply topically 2 (two) times daily. 30 g 0  . levothyroxine (SYNTHROID) 50 MCG tablet Take 1 tablet (50 mcg total) by mouth daily before breakfast. 30 tablet 3   No current facility-administered medications for this visit.   Facility-Administered Medications Ordered in Other Visits  Medication Dose Route Frequency Provider Last Rate Last Dose  . 0.9 %  sodium chloride infusion   Intravenous Once [name], MD      . sodium chloride flush (NS) 0.9 % injection 10 mL  10 mL Intravenous  PRN [name], MD   10 mL at 10/01/15 0956    PHYSICAL EXAMINATION: ECOG PERFORMANCE STATUS: 1 - Symptomatic but completely ambulatory  Filed Vitals:   02/13/16 1059  BP: 101/67  Pulse: 66  Temp: 97.7 F (36.5 C)  Resp: 18   Filed Weights   02/13/16 1059  Weight: 135 lb 4.8 oz (61.372 kg)    GENERAL:alert, no distress and comfortable SKIN: skin color, texture, turgor are normal, no rashes or significant lesions EYES: normal, Conjunctiva are pink and non-injected, sclera clear OROPHARYNX:no exudate, no erythema and lips, buccal mucosa, and tongue normal  NECK: supple, thyroid normal size, non-tender, without nodularity LYMPH:  no palpable lymphadenopathy in the cervical, axillary or inguinal LUNGS: clear to auscultation and percussion with normal breathing effort HEART: regular rate & rhythm and no murmurs and no lower extremity edema ABDOMEN:abdomen soft, non-tender and normal bowel sounds Musculoskeletal:no cyanosis of digits and no clubbing  NEURO: alert & oriented x 3 with fluent speech, no focal motor/sensory deficits  LABORATORY DATA:  I have reviewed the data as listed    Component Value Date/Time   NA 139 02/13/2016 1031   NA 145 12/24/2015 1716   NA 140 07/19/2015   K 3.8 02/13/2016 1031   K 2.8* 12/24/2015 1716   CL 105 12/24/2015 1716  CO2 30* 02/13/2016 1031   CO2 24 12/24/2015 1716   GLUCOSE 109 02/13/2016 1031   GLUCOSE 52* 12/24/2015 1716   BUN 15.2 02/13/2016 1031   BUN 7 12/24/2015 1716   BUN 10 07/19/2015   CREATININE 0.7 02/13/2016 1031   CREATININE 0.48* 12/24/2015 1716   CREATININE 0.65 08/16/2015 1538   CREATININE 0.6 07/19/2015   CALCIUM 9.2 02/13/2016 1031   CALCIUM 7.9* 12/24/2015 1716   PROT 6.6 02/13/2016 1031   PROT 6.0* 12/24/2015 1716   ALBUMIN 3.4* 02/13/2016 1031   ALBUMIN 3.0* 12/24/2015 1716   AST 18 02/13/2016 1031   AST 130* 12/24/2015 1716   ALT <9 02/13/2016 1031   ALT 44 12/24/2015 1716   ALKPHOS 109 02/13/2016 1031    ALKPHOS 107 12/24/2015 1716   BILITOT 0.32 02/13/2016 1031   BILITOT 1.2 12/24/2015 1716   GFRNONAA >60 12/24/2015 1716   GFRAA >60 12/24/2015 1716    No results found for: SPEP, UPEP  Lab Results  Component Value Date   WBC 4.1 02/13/2016   NEUTROABS 3.0 02/13/2016   HGB 10.5* 02/13/2016   HCT 31.8* 02/13/2016   MCV 99.7* 02/13/2016   PLT 187 02/13/2016      Chemistry      Component Value Date/Time   NA 139 02/13/2016 1031   NA 145 12/24/2015 1716   NA 140 07/19/2015   K 3.8 02/13/2016 1031   K 2.8* 12/24/2015 1716   CL 105 12/24/2015 1716   CO2 30* 02/13/2016 1031   CO2 24 12/24/2015 1716   BUN 15.2 02/13/2016 1031   BUN 7 12/24/2015 1716   BUN 10 07/19/2015   CREATININE 0.7 02/13/2016 1031   CREATININE 0.48* 12/24/2015 1716   CREATININE 0.65 08/16/2015 1538   CREATININE 0.6 07/19/2015   GLU 82 07/19/2015      Component Value Date/Time   CALCIUM 9.2 02/13/2016 1031   CALCIUM 7.9* 12/24/2015 1716   ALKPHOS 109 02/13/2016 1031   ALKPHOS 107 12/24/2015 1716   AST 18 02/13/2016 1031   AST 130* 12/24/2015 1716   ALT <9 02/13/2016 1031   ALT 44 12/24/2015 1716   BILITOT 0.32 02/13/2016 1031   BILITOT 1.2 12/24/2015 1716     ASSESSMENT & PLAN:  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[place]) He tolerated treatment well without any side effects. We will continue treatment without delay. I will order imaging study after 3 cycles of therapy    Major depressive disorder, recurrent episode, moderate ([place])  He felt that his depression is better controlled with recent changes of his antidepressants and he is not suicidal. He will continue medication as prescribed by recent psychiatrist I will make sure he gets a follow-up appointment for medication adjustment  Chronic pain He felt that pain is under control today. He will continue to use immediate release morphine as needed only for pain I plan to start morphine taper and reduce his dose to 15 mg prn     Orthostatic  hypotension His blood pressure is low today  He will continue on Florinef and are recommended increase oral intake as tolerated  Anemia of chronic illness This is likely due to recent treatment. The patient denies recent history of bleeding such as epistaxis, hematuria or hematochezia. He is asymptomatic from the anemia. I will observe for now.  He does not require transfusion now. I will continue the chemotherapy at current dose without dosage adjustment.  If the anemia gets progressive worse in the future, I might have to delay  his treatment or adjust the chemotherapy dose.   Orders Placed This Encounter  Procedures  . CT CHEST W CONTRAST    Standing Status: Future     Number of Occurrences:      Standing Expiration Date: 04/14/2017    Order Specific Question:  Reason for Exam (SYMPTOM  OR DIAGNOSIS REQUIRED)    Answer:  staging supraglottic ca, assess response to Rx    Order Specific Question:  Preferred imaging location?    Answer:  [facility]  . CT SOFT TISSUE NECK W CONTRAST    Standing Status: Future     Number of Occurrences:      Standing Expiration Date: 05/15/2017    Order Specific Question:  Reason for Exam (SYMPTOM  OR DIAGNOSIS REQUIRED)    Answer:  staging supraglottic ca, assess response to Rx    Order Specific Question:  Preferred imaging location?    Answer:  [facility]   All questions were answered. The patient knows to call the clinic with any problems, questions or concerns. No barriers to learning was detected. I spent 25 minutes counseling the patient face to face. The total time spent in the appointment was 30 minutes and more than 50% was on counseling and review of test results     [facility], [place], MD 02/13/2016 3:42 PM

## 2016-02-13 NOTE — Progress Notes (Signed)
Nutrition follow-up completed with patient during infusion for laryngeal cancer. Weight has been stable and was documented as 135.3 pounds on June 28 stable from 135.4 pounds June 7. Patient denies nutrition impact symptoms. He continues to consume oral nutrition supplements. Patient reports he has quit drinking alcohol.  Unintended weight loss resolved.  Encouraged patient to continue strategies for adequate calories and protein intake. Patient has my contact information for questions or concerns.

## 2016-02-13 NOTE — Assessment & Plan Note (Signed)
He felt that his depression is better controlled with recent changes of his antidepressants and he is not suicidal. He will continue medication as prescribed by recent psychiatrist I will make sure he gets a follow-up appointment for medication adjustment

## 2016-03-02 ENCOUNTER — Other Ambulatory Visit: Payer: Self-pay | Admitting: Hematology and Oncology

## 2016-03-04 ENCOUNTER — Telehealth: Payer: Self-pay | Admitting: *Deleted

## 2016-03-04 NOTE — Telephone Encounter (Signed)
Patient called asking when xrays are scheduled tomorrow and what time are appointments 03-06-2016.  Scans are pending and not scheduled at this time.  Instructed to come in on 03-06-2016 at 9:45 am.

## 2016-03-06 ENCOUNTER — Ambulatory Visit: Payer: Medicaid Other

## 2016-03-06 ENCOUNTER — Telehealth: Payer: Self-pay | Admitting: Hematology and Oncology

## 2016-03-06 ENCOUNTER — Ambulatory Visit (HOSPITAL_BASED_OUTPATIENT_CLINIC_OR_DEPARTMENT_OTHER): Payer: Medicaid Other | Admitting: Hematology and Oncology

## 2016-03-06 ENCOUNTER — Ambulatory Visit (HOSPITAL_BASED_OUTPATIENT_CLINIC_OR_DEPARTMENT_OTHER): Payer: Medicaid Other

## 2016-03-06 ENCOUNTER — Other Ambulatory Visit (HOSPITAL_BASED_OUTPATIENT_CLINIC_OR_DEPARTMENT_OTHER): Payer: Medicaid Other

## 2016-03-06 ENCOUNTER — Encounter: Payer: Self-pay | Admitting: Hematology and Oncology

## 2016-03-06 VITALS — BP 113/71 | HR 57 | Temp 98.1°F | Resp 18 | Ht 73.0 in | Wt 136.1 lb

## 2016-03-06 DIAGNOSIS — C7802 Secondary malignant neoplasm of left lung: Secondary | ICD-10-CM

## 2016-03-06 DIAGNOSIS — E876 Hypokalemia: Secondary | ICD-10-CM

## 2016-03-06 DIAGNOSIS — K1231 Oral mucositis (ulcerative) due to antineoplastic therapy: Secondary | ICD-10-CM

## 2016-03-06 DIAGNOSIS — C7801 Secondary malignant neoplasm of right lung: Secondary | ICD-10-CM

## 2016-03-06 DIAGNOSIS — C321 Malignant neoplasm of supraglottis: Secondary | ICD-10-CM

## 2016-03-06 DIAGNOSIS — G8929 Other chronic pain: Secondary | ICD-10-CM | POA: Diagnosis not present

## 2016-03-06 DIAGNOSIS — E440 Moderate protein-calorie malnutrition: Secondary | ICD-10-CM

## 2016-03-06 DIAGNOSIS — Z95828 Presence of other vascular implants and grafts: Secondary | ICD-10-CM

## 2016-03-06 DIAGNOSIS — F331 Major depressive disorder, recurrent, moderate: Secondary | ICD-10-CM | POA: Diagnosis not present

## 2016-03-06 DIAGNOSIS — F411 Generalized anxiety disorder: Secondary | ICD-10-CM | POA: Diagnosis not present

## 2016-03-06 DIAGNOSIS — Z5112 Encounter for antineoplastic immunotherapy: Secondary | ICD-10-CM

## 2016-03-06 DIAGNOSIS — C7800 Secondary malignant neoplasm of unspecified lung: Secondary | ICD-10-CM

## 2016-03-06 DIAGNOSIS — E43 Unspecified severe protein-calorie malnutrition: Secondary | ICD-10-CM

## 2016-03-06 DIAGNOSIS — D61810 Antineoplastic chemotherapy induced pancytopenia: Secondary | ICD-10-CM

## 2016-03-06 DIAGNOSIS — T451X5A Adverse effect of antineoplastic and immunosuppressive drugs, initial encounter: Secondary | ICD-10-CM

## 2016-03-06 LAB — CBC WITH DIFFERENTIAL/PLATELET
BASO%: 0.8 % (ref 0.0–2.0)
Basophils Absolute: 0 10*3/uL (ref 0.0–0.1)
EOS%: 4.1 % (ref 0.0–7.0)
Eosinophils Absolute: 0.1 10*3/uL (ref 0.0–0.5)
HEMATOCRIT: 33.2 % — AB (ref 38.4–49.9)
HEMOGLOBIN: 11 g/dL — AB (ref 13.0–17.1)
LYMPH#: 0.5 10*3/uL — AB (ref 0.9–3.3)
LYMPH%: 15 % (ref 14.0–49.0)
MCH: 32.2 pg (ref 27.2–33.4)
MCHC: 33.2 g/dL (ref 32.0–36.0)
MCV: 97 fL (ref 79.3–98.0)
MONO#: 0.4 10*3/uL (ref 0.1–0.9)
MONO%: 12.5 % (ref 0.0–14.0)
NEUT%: 67.6 % (ref 39.0–75.0)
NEUTROS ABS: 2.4 10*3/uL (ref 1.5–6.5)
PLATELETS: 188 10*3/uL (ref 140–400)
RBC: 3.43 10*6/uL — ABNORMAL LOW (ref 4.20–5.82)
RDW: 12.5 % (ref 11.0–14.6)
WBC: 3.6 10*3/uL — AB (ref 4.0–10.3)

## 2016-03-06 LAB — COMPREHENSIVE METABOLIC PANEL
ALBUMIN: 3.2 g/dL — AB (ref 3.5–5.0)
ALT: 10 U/L (ref 0–55)
ANION GAP: 10 meq/L (ref 3–11)
AST: 17 U/L (ref 5–34)
Alkaline Phosphatase: 119 U/L (ref 40–150)
BILIRUBIN TOTAL: 0.31 mg/dL (ref 0.20–1.20)
BUN: 10.8 mg/dL (ref 7.0–26.0)
CALCIUM: 9.2 mg/dL (ref 8.4–10.4)
CO2: 32 mEq/L — ABNORMAL HIGH (ref 22–29)
CREATININE: 0.7 mg/dL (ref 0.7–1.3)
Chloride: 100 mEq/L (ref 98–109)
EGFR: >90 mL/min/{1.73_m2} (ref >90)
Glucose: 98 mg/dl (ref 70–140)
Potassium: 3.2 mEq/L — ABNORMAL LOW (ref 3.5–5.1)
Sodium: 142 mEq/L (ref 136–145)
TOTAL PROTEIN: 6.4 g/dL (ref 6.4–8.3)

## 2016-03-06 MED ORDER — SODIUM CHLORIDE 0.9% FLUSH
10.0000 mL | INTRAVENOUS | Status: DC | PRN
  Administered 2016-03-06: 10 mL
  Filled 2016-03-06: qty 10

## 2016-03-06 MED ORDER — SODIUM CHLORIDE 0.9% FLUSH
10.0000 mL | INTRAVENOUS | Status: DC | PRN
  Administered 2016-03-06: 10 mL via INTRAVENOUS
  Filled 2016-03-06: qty 10

## 2016-03-06 MED ORDER — SODIUM CHLORIDE 0.9 % IV SOLN
Freq: Once | INTRAVENOUS | Status: AC
  Administered 2016-03-06: 12:00:00 via INTRAVENOUS

## 2016-03-06 MED ORDER — MORPHINE SULFATE 15 MG PO TABS: 15.0000 mg | ORAL_TABLET | Freq: Four times a day (QID) | ORAL | Status: DC | PRN

## 2016-03-06 MED ORDER — HEPARIN SOD (PORK) LOCK FLUSH 100 UNIT/ML IV SOLN
500.0000 [IU] | Freq: Once | INTRAVENOUS | Status: AC | PRN
  Administered 2016-03-06: 500 [IU]
  Filled 2016-03-06: qty 5

## 2016-03-06 MED ORDER — SODIUM CHLORIDE 0.9 % IJ SOLN
10.0000 mL | INTRAMUSCULAR | Status: DC | PRN
  Filled 2016-03-06: qty 10

## 2016-03-06 MED ORDER — SODIUM CHLORIDE 0.9 % IV SOLN
200.0000 mg | Freq: Once | INTRAVENOUS | Status: AC
  Administered 2016-03-06: 200 mg via INTRAVENOUS
  Filled 2016-03-06: qty 8

## 2016-03-06 MED FILL — MORPHINE SULFATE IR 15 MG T: 15 | 22 days supply | Qty: 90 | Fill #0

## 2016-03-06 NOTE — Assessment & Plan Note (Signed)
This could be related to prior treatment. He is not symptomatic. I will proceed with treatment without dose adjustment.

## 2016-03-06 NOTE — Assessment & Plan Note (Signed)
This is well-controlled.

## 2016-03-06 NOTE — Patient Instructions (Signed)
[facility] Discharge Instructions for Patients Receiving Chemotherapy  Today you received the following chemotherapy agents: keytruda  To help prevent nausea and vomiting after your treatment, we encourage you to take your nausea medication as directed.   If you develop nausea and vomiting that is not controlled by your nausea medication, call the clinic.   BELOW ARE SYMPTOMS THAT SHOULD BE REPORTED IMMEDIATELY:  *FEVER GREATER THAN 100.5 F  *CHILLS WITH OR WITHOUT FEVER  NAUSEA AND VOMITING THAT IS NOT CONTROLLED WITH YOUR NAUSEA MEDICATION  *UNUSUAL SHORTNESS OF BREATH  *UNUSUAL BRUISING OR BLEEDING  TENDERNESS IN MOUTH AND THROAT WITH OR WITHOUT PRESENCE OF ULCERS  *URINARY PROBLEMS  *BOWEL PROBLEMS  UNUSUAL RASH Items with * indicate a potential emergency and should be followed up as soon as possible.  Feel free to call the clinic you have any questions or concerns. The clinic phone number is [phone].  Please show the CHEMO ALERT CARD at check-in to the Emergency Department and triage nurse.   

## 2016-03-06 NOTE — Assessment & Plan Note (Signed)
Cause is unknown, but could be related to adrenal insufficiency. He is not symptomatic. Observe only.

## 2016-03-06 NOTE — Assessment & Plan Note (Signed)
He is eating better since the last time I saw him  He is able to swallow most foods by mouth. I continue to encourage him to increase oral intake as tolerated

## 2016-03-06 NOTE — Assessment & Plan Note (Signed)
He felt that his depression is better controlled with recent changes of his antidepressants and he is not suicidal. He will continue medication as prescribed by recent psychiatrist I reinforced the importance of him getting a consistent follow-up appointment for medication adjustment

## 2016-03-06 NOTE — Assessment & Plan Note (Signed)
He tolerated treatment well without any side effects. We will continue treatment without delay. I will order imaging study after this cycle

## 2016-03-06 NOTE — Progress Notes (Signed)
[facility] OFFICE PROGRESS NOTE  Patient Care Team: No Pcp Per Patient as PCP - General (General Practice) [name], MD as Consulting Physician (Otolaryngology) [name], MD as Attending Physician (Radiation Oncology) [name], MD as Consulting Physician (Hematology and Oncology) Leota Sauers, RN as Oncology Nurse Navigator Lenn Cal, DDS as Consulting Physician (Dentistry)  SUMMARY OF ONCOLOGIC HISTORY: Oncology History   Carcinoma of supraglottis   Staging form: Larynx - Supraglottis, AJCC 7th Edition     Clinical stage from 03/13/2015: Stage IVA (T3, N2b, M0) - Signed by [name], MD on 03/13/2015     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[place])   01/22/2015 Imaging CT scan showed enhancing infiltrative mass centered at the right piriform sinus with bilateral neck lymphadenopathy   02/22/2015 - 03/08/2015 Hospital Admission He was admitted to the hospital for management of advanced supraglottic cancer status post tracheostomy, biopsy, dental extraction, placement of feeding tube and subsequent discharge to skilled nursing facility   02/22/2015 Pathology Results Accession: [id number] 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   03/09/2015 PET scan 1)  Right piriform sinus mass with right level 2 nodal metastasis.  2)  Hypermetabolic thoracic nodes which are at least partially felt to be reactive.  3)  No evidence of subdiaphragmatic metastatic disease.    03/16/2015 Procedure He has placement of port   03/21/2015 - 05/04/2015 Chemotherapy He received high dose cisplatin x 3 cycles   03/21/2015 - 05/09/2015 Radiation Therapy Tomotherapy to supraglottic tumor and bilateral neck:  70 Gy in 35 fractions to gross disease, 63 Gy in 35 fractions to high risk nodal echelons, and 56 Gy in 35 fractions to intermediate risk nodal echelons.   05/09/2015 Miscellaneous He went  to the ED with fall   06/20/2015 Procedure Tracheostomy was removed   09/06/2015 - 09/08/2015 Hospital Admission  he was admitted to the hospital with alcohol intoxication, dehydration and pain. CT scan of the chest show bilateral metastatic disease to the lung   10/01/2015 Imaging  PET CT scan showed diffuse, multiple hypermetabolic activity in both lungs suspicious for metastatic cancer   10/01/2015 Adverse Reaction he received cetuximab, complicated by anaphylactic reaction   10/01/2015 - 10/03/2015 Hospital Admission He was admitted to the hospital, intubated for cardiac arrest   10/15/2015 - 11/23/2015 Chemotherapy He began palliative chemo with carboplatin and 5FU   10/29/2015 Adverse Reaction 5FU dose reduce by 10% due to nausea and vomiting   11/23/2015 Imaging Ct chest showed disease progression   01/02/2016 -  Chemotherapy He received Keytruda    INTERVAL HISTORY: Please see below for problem oriented charting. He returns for further follow-up, prior to cycle 4 His pain appears to be under control with morphine sulfate as needed. His anxiety and depression is under control. He denies recent smoking or drinking. Unfortunately, the CT scan was not done. He denies cough, chest pain or shortness of breath  REVIEW OF SYSTEMS:   Constitutional: Denies fevers, chills or abnormal weight loss Eyes: Denies blurriness of vision Ears, nose, mouth, throat, and face: Denies mucositis or sore throat Respiratory: Denies cough, dyspnea or wheezes Cardiovascular: Denies palpitation, chest discomfort or lower extremity swelling Gastrointestinal:  Denies nausea, heartburn or change in bowel habits Skin: Denies abnormal skin rashes Lymphatics: Denies new lymphadenopathy or easy bruising Neurological:Denies numbness, tingling or new weaknesses Behavioral/Psych: Mood is stable, no new changes  All other systems  were reviewed with the patient and are negative.  I have reviewed the past medical history, past  surgical history, social history and family history with the patient and they are unchanged from previous note.  ALLERGIES:  is allergic to cetuximab.  MEDICATIONS:  Current Outpatient Prescriptions  Medication Sig Dispense Refill  . fludrocortisone (FLORINEF) 0.1 MG tablet Take 2 tablets (0.2 mg total) by mouth daily. 90 tablet 6  . FLUoxetine (PROZAC) 40 MG capsule Take 40 mg by mouth daily.    Marland Kitchen levothyroxine (SYNTHROID) 50 MCG tablet Take 1 tablet (50 mcg total) by mouth daily before breakfast. 30 tablet 3  . morphine (MSIR) 15 MG tablet Take 1 tablet (15 mg total) by mouth every 6 (six) hours as needed for severe pain. 90 tablet 0  . ondansetron (ZOFRAN) 8 MG tablet Take 1 tablet (8 mg total) by mouth every 8 (eight) hours as needed for nausea. 90 tablet 3  . potassium chloride SA (K-DUR,KLOR-CON) 20 MEQ tablet Take 1 tablet (20 mEq total) by mouth 2 (two) times daily. 60 tablet 1  . prochlorperazine (COMPAZINE) 10 MG tablet Take 1 tablet (10 mg total) by mouth every 6 (six) hours as needed. 90 tablet 3  . prochlorperazine (COMPAZINE) 10 MG tablet TAKE 1 TABLET BY MOUTH EVERY 6 HOURS AS NEEDED 360 tablet 3  . senna (SENOKOT) 8.6 MG TABS tablet Take 1 tablet (8.6 mg total) by mouth 2 (two) times daily. (Patient taking differently: Take 1 tablet by mouth 2 (two) times daily as needed for mild constipation (when taking pain medication.). ) 60 each 0  . traZODone (DESYREL) 100 MG tablet Take 1 tablet (100 mg total) by mouth at bedtime as needed for sleep. 30 tablet 3  . triamcinolone cream (KENALOG) 0.1 % Apply topically 2 (two) times daily. 30 g 0   No current facility-administered medications for this visit.   Facility-Administered Medications Ordered in Other Visits  Medication Dose Route Frequency Provider Last Rate Last Dose  . 0.9 %  sodium chloride infusion   Intravenous Once [name], MD      . sodium chloride flush (NS) 0.9 % injection 10 mL  10 mL Intravenous PRN [name], MD    10 mL at 10/01/15 0956  . sodium chloride flush (NS) 0.9 % injection 10 mL  10 mL Intracatheter PRN [name], MD   10 mL at 03/06/16 1336    PHYSICAL EXAMINATION: ECOG PERFORMANCE STATUS: 1 - Symptomatic but completely ambulatory  Filed Vitals:   03/06/16 1016  BP: 113/71  Pulse: 57  Temp: 98.1 F (36.7 C)  Resp: 18   Filed Weights   03/06/16 1016  Weight: 136 lb 1.6 oz (61.735 kg)    GENERAL:alert, no distress and comfortable SKIN: Noted vitiligo. EYES: normal, Conjunctiva are pink and non-injected, sclera clear OROPHARYNX:no exudate, no erythema and lips, buccal mucosa, and tongue normal  NECK: supple, thyroid normal size, non-tender, without nodularity LYMPH:  no palpable lymphadenopathy in the cervical, axillary or inguinal LUNGS: clear to auscultation and percussion with normal breathing effort HEART: regular rate & rhythm and no murmurs and no lower extremity edema ABDOMEN:abdomen soft, non-tender and normal bowel sounds Musculoskeletal:no cyanosis of digits and no clubbing  NEURO: alert & oriented x 3 with fluent speech, no focal motor/sensory deficits  LABORATORY DATA:  I have reviewed the data as listed    Component Value Date/Time   NA 142 03/06/2016 0933   NA 145 12/24/2015 1716   NA 140  07/19/2015   K 3.2* 03/06/2016 0933   K 2.8* 12/24/2015 1716   CL 105 12/24/2015 1716   CO2 32* 03/06/2016 0933   CO2 24 12/24/2015 1716   GLUCOSE 98 03/06/2016 0933   GLUCOSE 52* 12/24/2015 1716   BUN 10.8 03/06/2016 0933   BUN 7 12/24/2015 1716   BUN 10 07/19/2015   CREATININE 0.7 03/06/2016 0933   CREATININE 0.48* 12/24/2015 1716   CREATININE 0.65 08/16/2015 1538   CREATININE 0.6 07/19/2015   CALCIUM 9.2 03/06/2016 0933   CALCIUM 7.9* 12/24/2015 1716   PROT 6.4 03/06/2016 0933   PROT 6.0* 12/24/2015 1716   ALBUMIN 3.2* 03/06/2016 0933   ALBUMIN 3.0* 12/24/2015 1716   AST 17 03/06/2016 0933   AST 130* 12/24/2015 1716   ALT 10 03/06/2016 0933   ALT 44  12/24/2015 1716   ALKPHOS 119 03/06/2016 0933   ALKPHOS 107 12/24/2015 1716   BILITOT 0.31 03/06/2016 0933   BILITOT 1.2 12/24/2015 1716   GFRNONAA >60 12/24/2015 1716   GFRAA >60 12/24/2015 1716    No results found for: SPEP, UPEP  Lab Results  Component Value Date   WBC 3.6* 03/06/2016   NEUTROABS 2.4 03/06/2016   HGB 11.0* 03/06/2016   HCT 33.2* 03/06/2016   MCV 97.0 03/06/2016   PLT 188 03/06/2016      Chemistry      Component Value Date/Time   NA 142 03/06/2016 0933   NA 145 12/24/2015 1716   NA 140 07/19/2015   K 3.2* 03/06/2016 0933   K 2.8* 12/24/2015 1716   CL 105 12/24/2015 1716   CO2 32* 03/06/2016 0933   CO2 24 12/24/2015 1716   BUN 10.8 03/06/2016 0933   BUN 7 12/24/2015 1716   BUN 10 07/19/2015   CREATININE 0.7 03/06/2016 0933   CREATININE 0.48* 12/24/2015 1716   CREATININE 0.65 08/16/2015 1538   CREATININE 0.6 07/19/2015   GLU 82 07/19/2015      Component Value Date/Time   CALCIUM 9.2 03/06/2016 0933   CALCIUM 7.9* 12/24/2015 1716   ALKPHOS 119 03/06/2016 0933   ALKPHOS 107 12/24/2015 1716   AST 17 03/06/2016 0933   AST 130* 12/24/2015 1716   ALT 10 03/06/2016 0933   ALT 44 12/24/2015 1716   BILITOT 0.31 03/06/2016 0933   BILITOT 1.2 12/24/2015 1716     ASSESSMENT & PLAN:  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[place]) He tolerated treatment well without any side effects. We will continue treatment without delay. I will order imaging study after this cycle    Pancytopenia due to antineoplastic chemotherapy ([place]) This could be related to prior treatment. He is not symptomatic. I will proceed with treatment without dose adjustment.     Protein-calorie malnutrition, moderate ([place]) He is eating better since the last time I saw him  He is able to swallow most foods by mouth. I continue to encourage him to increase oral intake as tolerated    Hypokalemia Cause is unknown, but could be related to adrenal insufficiency. He is not symptomatic.  Observe only.   Anxiety disorder This is well controlled  Major depressive disorder, recurrent episode, moderate ([place])  He felt that his depression is better controlled with recent changes of his antidepressants and he is not suicidal. He will continue medication as prescribed by recent psychiatrist I reinforced the importance of him getting a consistent follow-up appointment for medication adjustment  Chronic pain He felt that pain is under control today. He will continue to use immediate release  morphine as needed only for pain I refilled his prescription today      No orders of the defined types were placed in this encounter.   All questions were answered. The patient knows to call the clinic with any problems, questions or concerns. No barriers to learning was detected. I spent 20 minutes counseling the patient face to face. The total time spent in the appointment was 30 minutes and more than 50% was on counseling and review of test results     [facility], [place], MD 03/06/2016 3:20 PM

## 2016-03-06 NOTE — Assessment & Plan Note (Signed)
He felt that pain is under control today. He will continue to use immediate release morphine as needed only for pain I refilled his prescription today

## 2016-03-06 NOTE — Telephone Encounter (Signed)
Pt confirmed appt and received avs °

## 2016-03-25 ENCOUNTER — Ambulatory Visit (HOSPITAL_COMMUNITY)
Admission: RE | Admit: 2016-03-25 | Discharge: 2016-03-25 | Disposition: A | Discharge status: Home / Self Care | Payer: Medicaid Other | Source: Ambulatory Visit | Attending: Hematology and Oncology | Admitting: Hematology and Oncology

## 2016-03-25 ENCOUNTER — Ambulatory Visit (HOSPITAL_COMMUNITY): Admission: RE | Admit: 2016-03-25 | Payer: Medicaid Other | Source: Ambulatory Visit

## 2016-03-25 ENCOUNTER — Encounter (HOSPITAL_COMMUNITY): Payer: Self-pay

## 2016-03-25 DIAGNOSIS — C321 Malignant neoplasm of supraglottis: Secondary | ICD-10-CM | POA: Diagnosis present

## 2016-03-25 DIAGNOSIS — R918 Other nonspecific abnormal finding of lung field: Secondary | ICD-10-CM | POA: Insufficient documentation

## 2016-03-25 DIAGNOSIS — R1909 Other intra-abdominal and pelvic swelling, mass and lump: Secondary | ICD-10-CM | POA: Diagnosis not present

## 2016-03-25 DIAGNOSIS — C7800 Secondary malignant neoplasm of unspecified lung: Secondary | ICD-10-CM

## 2016-03-25 MED ORDER — IOPAMIDOL (ISOVUE-300) INJECTION 61%
75.0000 mL | Freq: Once | INTRAVENOUS | Status: AC | PRN
  Administered 2016-03-25: 75 mL via INTRAVENOUS

## 2016-03-26 ENCOUNTER — Other Ambulatory Visit: Payer: Self-pay

## 2016-03-26 ENCOUNTER — Ambulatory Visit: Payer: Self-pay

## 2016-03-26 ENCOUNTER — Encounter: Payer: Self-pay | Admitting: *Deleted

## 2016-03-26 ENCOUNTER — Telehealth: Payer: Self-pay | Admitting: *Deleted

## 2016-03-26 ENCOUNTER — Encounter: Payer: Self-pay | Admitting: Hematology and Oncology

## 2016-03-26 ENCOUNTER — Ambulatory Visit (HOSPITAL_BASED_OUTPATIENT_CLINIC_OR_DEPARTMENT_OTHER): Payer: Medicaid Other | Admitting: Hematology and Oncology

## 2016-03-26 ENCOUNTER — Telehealth: Payer: Self-pay | Admitting: Hematology and Oncology

## 2016-03-26 VITALS — BP 108/70 | HR 85 | Temp 98.1°F | Resp 18 | Wt 132.9 lb

## 2016-03-26 DIAGNOSIS — E440 Moderate protein-calorie malnutrition: Secondary | ICD-10-CM

## 2016-03-26 DIAGNOSIS — G893 Neoplasm related pain (acute) (chronic): Secondary | ICD-10-CM

## 2016-03-26 DIAGNOSIS — C7800 Secondary malignant neoplasm of unspecified lung: Secondary | ICD-10-CM

## 2016-03-26 DIAGNOSIS — F419 Anxiety disorder, unspecified: Secondary | ICD-10-CM | POA: Diagnosis not present

## 2016-03-26 DIAGNOSIS — C321 Malignant neoplasm of supraglottis: Secondary | ICD-10-CM

## 2016-03-26 DIAGNOSIS — F411 Generalized anxiety disorder: Secondary | ICD-10-CM

## 2016-03-26 MED ORDER — MORPHINE SULFATE 30 MG PO TABS: 30.0000 mg | ORAL_TABLET | Freq: Four times a day (QID) | ORAL | 0 refills | Status: DC | PRN

## 2016-03-26 MED ORDER — DEXAMETHASONE 4 MG PO TABS: 8.0000 mg | ORAL_TABLET | Freq: Two times a day (BID) | ORAL | 1 refills | Status: DC

## 2016-03-26 MED FILL — DEXAMETHASONE 4 MG TABLET: 4 | 7 days supply | Qty: 30 | Fill #0

## 2016-03-26 MED FILL — MORPHINE SULFATE IR 30 MG T: 30 | 22 days supply | Qty: 90 | Fill #0

## 2016-03-26 NOTE — Assessment & Plan Note (Signed)
He is not symptomatic from lung metastasis. Observe only.

## 2016-03-26 NOTE — Telephone Encounter (Signed)
[facility] outpatient pharmacy called to clarify Dexamethasone 4 mg dose.   Is it 2 tablets BID every day or just 2 tablets BID for 2 days after each chemo?

## 2016-03-26 NOTE — Assessment & Plan Note (Signed)
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 Recommend increased oral intake as tolerated

## 2016-03-26 NOTE — Assessment & Plan Note (Signed)
He has cancer pain. 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 I increased his prescription IR morphine to 30 mg every 6 hours as needed. If the pain is uncontrollable in the next visit, we will consider possible radiation therapy

## 2016-03-26 NOTE — Telephone Encounter (Signed)
Gave pt cal & avs °

## 2016-03-26 NOTE — Patient Instructions (Signed)
Docetaxel injection  What is this medicine?  DOCETAXEL (doe se TAX el) is a chemotherapy drug. It targets fast dividing cells, like cancer cells, and causes these cells to die. This medicine is used to treat many types of cancers like breast cancer, certain stomach cancers, head and neck cancer, lung cancer, and prostate cancer.  This medicine may be used for other purposes; ask your health care provider or pharmacist if you have questions.  What should I tell my health care provider before I take this medicine?  They need to know if you have any of these conditions:  -infection (especially a virus infection such as chickenpox, cold sores, or herpes)  -liver disease  -low blood counts, like low white cell, platelet, or red cell counts  -an unusual or allergic reaction to docetaxel, polysorbate 80, other chemotherapy agents, other medicines, foods, dyes, or preservatives  -pregnant or trying to get pregnant  -breast-feeding  How should I use this medicine?  This drug is given as an infusion into a vein. It is administered in a hospital or clinic by a specially trained health care professional.  Talk to your pediatrician regarding the use of this medicine in children. Special care may be needed.  Overdosage: If you think you have taken too much of this medicine contact a poison control center or emergency room at once.  NOTE: This medicine is only for you. Do not share this medicine with others.  What if I miss a dose?  It is important not to miss your dose. Call your doctor or health care professional if you are unable to keep an appointment.  What may interact with this medicine?  -cyclosporine  -erythromycin  -ketoconazole  -medicines to increase blood counts like filgrastim, pegfilgrastim, sargramostim  -vaccines  Talk to your doctor or health care professional before taking any of these medicines:  -acetaminophen  -aspirin  -ibuprofen  -ketoprofen  -naproxen  This list may not describe all possible interactions.  Give your health care provider a list of all the medicines, herbs, non-prescription drugs, or dietary supplements you use. Also tell them if you smoke, drink alcohol, or use illegal drugs. Some items may interact with your medicine.  What should I watch for while using this medicine?  Your condition will be monitored carefully while you are receiving this medicine. You will need important blood work done while you are taking this medicine.  This drug may make you feel generally unwell. This is not uncommon, as chemotherapy can affect healthy cells as well as cancer cells. Report any side effects. Continue your course of treatment even though you feel ill unless your doctor tells you to stop.  In some cases, you may be given additional medicines to help with side effects. Follow all directions for their use.  Call your doctor or health care professional for advice if you get a fever, chills or sore throat, or other symptoms of a cold or flu. Do not treat yourself. This drug decreases your body's ability to fight infections. Try to avoid being around people who are sick.  This medicine may increase your risk to bruise or bleed. Call your doctor or health care professional if you notice any unusual bleeding.  This medicine may contain alcohol in the product. You may get drowsy or dizzy. Do not drive, use machinery, or do anything that needs mental alertness until you know how this medicine affects you. Do not stand or sit up quickly, especially if you are an older   patient. This reduces the risk of dizzy or fainting spells. Avoid alcoholic drinks.  Do not become pregnant while taking this medicine. Women should inform their doctor if they wish to become pregnant or think they might be pregnant. There is a potential for serious side effects to an unborn child. Talk to your health care professional or pharmacist for more information. Do not breast-feed an infant while taking this medicine.  What side effects may I notice  from receiving this medicine?  Side effects that you should report to your doctor or health care professional as soon as possible:  -allergic reactions like skin rash, itching or hives, swelling of the face, lips, or tongue  -low blood counts - This drug may decrease the number of white blood cells, red blood cells and platelets. You may be at increased risk for infections and bleeding.  -signs of infection - fever or chills, cough, sore throat, pain or difficulty passing urine  -signs of decreased platelets or bleeding - bruising, pinpoint red spots on the skin, black, tarry stools, nosebleeds  -signs of decreased red blood cells - unusually weak or tired, fainting spells, lightheadedness  -breathing problems  -fast or irregular heartbeat  -low blood pressure  -mouth sores  -nausea and vomiting  -pain, swelling, redness or irritation at the injection site  -pain, tingling, numbness in the hands or feet  -swelling of the ankle, feet, hands  -weight gain  Side effects that usually do not require medical attention (report to your prescriber or health care professional if they continue or are bothersome):  -bone pain  -complete hair loss including hair on your head, underarms, pubic hair, eyebrows, and eyelashes  -diarrhea  -excessive tearing  -changes in the color of fingernails  -loosening of the fingernails  -nausea  -muscle pain  -red flush to skin  -sweating  -weak or tired  This list may not describe all possible side effects. Call your doctor for medical advice about side effects. You may report side effects to FDA at 1-800-FDA-1088.  Where should I keep my medicine?  This drug is given in a hospital or clinic and will not be stored at home.  NOTE: This sheet is a summary. It may not cover all possible information. If you have questions about this medicine, talk to your doctor, pharmacist, or health care provider.      2016, [employer]. (2014-08-21 16:04:57)

## 2016-03-26 NOTE — Assessment & Plan Note (Signed)
This is well-controlled.

## 2016-03-26 NOTE — Assessment & Plan Note (Signed)
Unfortunately, he has signs of disease progression. Despite this, the patient wants to try for 4th line of chemotherapy. He is aware that response rate is poor, likely estimated to be 20% or less without significant improvement in quality of life for longevity. Treatment is strictly palliative in nature We discussed some of the risks, benefits, side-effects of Docetaxel.   Some of the short term side-effects included, though not limited to, risk of severe allergic reaction, fatigue, weight loss, pancytopenia, life-threatening infections, need for transfusions of blood products, nausea, vomiting, change in bowel habits, loss of hair, admission to hospital for various reasons, and risks of death.   Long term side-effects are also discussed including risks of infertility, permanent damage to nerve function, chronic fatigue, and rare secondary malignancy including bone marrow disorders.   The patient is aware that the response rates discussed earlier is not guaranteed.    After a long discussion, patient made an informed decision to proceed with the prescribed plan of care.   Patient education material was dispensed

## 2016-03-26 NOTE — Telephone Encounter (Signed)
Dr. Alvy Bimler clarified dexamethasone dose 4 mg,  2 tabs BID for two days after chemo treatment.  She s/w pharmacy herself.

## 2016-03-26 NOTE — Progress Notes (Signed)
[place] OFFICE PROGRESS NOTE  Patient Care Team: No Pcp Per Patient as PCP - General (General Practice) [name], MD as Consulting Physician (Otolaryngology) [name], MD as Attending Physician (Radiation Oncology) [name], MD as Consulting Physician (Hematology and Oncology) Leota Sauers, RN as Oncology Nurse Navigator Lenn Cal, DDS as Consulting Physician (Dentistry)  SUMMARY OF ONCOLOGIC HISTORY: Oncology History   Carcinoma of supraglottis   Staging form: Larynx - Supraglottis, AJCC 7th Edition     Clinical stage from 03/13/2015: Stage IVA (T3, N2b, M0) - Signed by [name], MD on 03/13/2015     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[place])   01/22/2015 Imaging    CT scan showed enhancing infiltrative mass centered at the right piriform sinus with bilateral neck lymphadenopathy     02/22/2015 - 03/08/2015 Hospital Admission    He was admitted to the hospital for management of advanced supraglottic cancer status post tracheostomy, biopsy, dental extraction, placement of feeding tube and subsequent discharge to skilled nursing facility     02/22/2015 Pathology Results    Accession: [id number] biopsy of supraglottic region came back squamous cell carcinoma, HPV negative     02/22/2015 Surgery    He underwent awake tracheostomy, direct laryngoscopy with biopsy.     03/09/2015 PET scan    1)  Right piriform sinus mass with right level 2 nodal metastasis.  2)  Hypermetabolic thoracic nodes which are at least partially felt to be reactive.  3)  No evidence of subdiaphragmatic metastatic disease.      03/16/2015 Procedure    He has placement of port     03/21/2015 - 05/04/2015 Chemotherapy    He received high dose cisplatin x 3 cycles     03/21/2015 - 05/09/2015 Radiation Therapy    Tomotherapy to supraglottic tumor and bilateral neck:  70 Gy in 35 fractions to gross disease, 63 Gy in 35 fractions to high risk nodal echelons, and 56 Gy in 35 fractions to intermediate  risk nodal echelons.     05/09/2015 Miscellaneous    He went to the ED with fall     06/20/2015 Procedure    Tracheostomy was removed     09/06/2015 - 09/08/2015 Hospital Admission     he was admitted to the hospital with alcohol intoxication, dehydration and pain. CT scan of the chest show bilateral metastatic disease to the lung     10/01/2015 Imaging     PET CT scan showed diffuse, multiple hypermetabolic activity in both lungs suspicious for metastatic cancer     10/01/2015 Adverse Reaction    he received cetuximab, complicated by anaphylactic reaction     10/01/2015 - 10/03/2015 Hospital Admission    He was admitted to the hospital, intubated for cardiac arrest     10/15/2015 - 11/23/2015 Chemotherapy    He began palliative chemo with carboplatin and 5FU     10/29/2015 Adverse Reaction    5FU dose reduce by 10% due to nausea and vomiting     11/23/2015 Imaging    Ct chest showed disease progression     01/02/2016 - 03/06/2016 Chemotherapy    He received Keytruda     03/25/2016 Imaging    Interval progression of disease. There is enlarging anterior mediastinal soft tissue mass. Enlargement of previous pulmonary nodules as well as development of multiple new pulmonary nodules. New mass along the undersurface of the right hemidiaphragm      03/25/2016 Imaging    CT neck showed resolution  of right-sided hypo pharyngeal mass. Resolution of right-sided cervical adenopathy. No new adenopathy. Soft tissue edema around the glottis compatible with radiation change.      INTERVAL HISTORY: Please see below for problem oriented charting. He returns with his aunt to review imaging study results. He has increasing retrosternal chest wall pain. Denies cough or Shortness of breath. He lost some weight. Denies dysphagia. He denies recent smoking or drinking  REVIEW OF SYSTEMS:   Constitutional: Denies fevers, chills  Eyes: Denies blurriness of vision Ears, nose, mouth, throat, and face: Denies  mucositis or sore throat Respiratory: Denies cough, dyspnea or wheezes Cardiovascular: Denies palpitation, chest discomfort or lower extremity swelling Gastrointestinal:  Denies nausea, heartburn or change in bowel habits Skin: Denies abnormal skin rashes Lymphatics: Denies new lymphadenopathy or easy bruising Neurological:Denies numbness, tingling or new weaknesses Behavioral/Psych: Mood is stable, no new changes  All other systems were reviewed with the patient and are negative.  I have reviewed the past medical history, past surgical history, social history and family history with the patient and they are unchanged from previous note.  ALLERGIES:  is allergic to cetuximab.  MEDICATIONS:  Current Outpatient Prescriptions  Medication Sig Dispense Refill  . fludrocortisone (FLORINEF) 0.1 MG tablet Take 2 tablets (0.2 mg total) by mouth daily. 90 tablet 6  . FLUoxetine (PROZAC) 40 MG capsule Take 40 mg by mouth daily.    Marland Kitchen levothyroxine (SYNTHROID) 50 MCG tablet Take 1 tablet (50 mcg total) by mouth daily before breakfast. 30 tablet 3  . morphine (MSIR) 30 MG tablet Take 1 tablet (30 mg total) by mouth every 6 (six) hours as needed for severe pain. 90 tablet 0  . ondansetron (ZOFRAN) 8 MG tablet Take 1 tablet (8 mg total) by mouth every 8 (eight) hours as needed for nausea. 90 tablet 3  . potassium chloride SA (K-DUR,KLOR-CON) 20 MEQ tablet Take 1 tablet (20 mEq total) by mouth 2 (two) times daily. 60 tablet 1  . prochlorperazine (COMPAZINE) 10 MG tablet Take 1 tablet (10 mg total) by mouth every 6 (six) hours as needed. 90 tablet 3  . prochlorperazine (COMPAZINE) 10 MG tablet TAKE 1 TABLET BY MOUTH EVERY 6 HOURS AS NEEDED 360 tablet 3  . senna (SENOKOT) 8.6 MG TABS tablet Take 1 tablet (8.6 mg total) by mouth 2 (two) times daily. (Patient taking differently: Take 1 tablet by mouth 2 (two) times daily as needed for mild constipation (when taking pain medication.). ) 60 each 0  . traZODone  (DESYREL) 100 MG tablet Take 1 tablet (100 mg total) by mouth at bedtime as needed for sleep. 30 tablet 3  . triamcinolone cream (KENALOG) 0.1 % Apply topically 2 (two) times daily. 30 g 0  . dexamethasone (DECADRON) 4 MG tablet Take 2 tablets (8 mg total) by mouth 2 (two) times daily. Take 2 tablets twice daily after chemo for 2 days. 30 tablet 1   No current facility-administered medications for this visit.    Facility-Administered Medications Ordered in Other Visits  Medication Dose Route Frequency Provider Last Rate Last Dose  . 0.9 %  sodium chloride infusion   Intravenous Once [name], MD      . sodium chloride flush (NS) 0.9 % injection 10 mL  10 mL Intravenous PRN [name], MD   10 mL at 10/01/15 0956    PHYSICAL EXAMINATION: ECOG PERFORMANCE STATUS: 1 - Symptomatic but completely ambulatory  Vitals:   03/26/16 1104  BP: 108/70  Pulse: 85  Resp: 18  Temp: 98.1 F (36.7 C)   Filed Weights   03/26/16 1104  Weight: 132 lb 14.4 oz (60.3 kg)    GENERAL:alert, no distress and comfortable. He looks thin SKIN: Notable for vitiligo EYES: normal, Conjunctiva are pink and non-injected, sclera clear Musculoskeletal:no cyanosis of digits and no clubbing  NEURO: alert & oriented x 3 with fluent speech, no focal motor/sensory deficits  LABORATORY DATA:  I have reviewed the data as listed    Component Value Date/Time   NA 142 03/06/2016 0933   K 3.2 (L) 03/06/2016 0933   CL 105 12/24/2015 1716   CO2 32 (H) 03/06/2016 0933   GLUCOSE 98 03/06/2016 0933   BUN 10.8 03/06/2016 0933   CREATININE 0.7 03/06/2016 0933   CALCIUM 9.2 03/06/2016 0933   PROT 6.4 03/06/2016 0933   ALBUMIN 3.2 (L) 03/06/2016 0933   AST 17 03/06/2016 0933   ALT 10 03/06/2016 0933   ALKPHOS 119 03/06/2016 0933   BILITOT 0.31 03/06/2016 0933   GFRNONAA >60 12/24/2015 1716   GFRAA >60 12/24/2015 1716    No results found for: SPEP, UPEP  Lab Results  Component Value Date   WBC 3.6 (L) 03/06/2016    NEUTROABS 2.4 03/06/2016   HGB 11.0 (L) 03/06/2016   HCT 33.2 (L) 03/06/2016   MCV 97.0 03/06/2016   PLT 188 03/06/2016      Chemistry      Component Value Date/Time   NA 142 03/06/2016 0933   K 3.2 (L) 03/06/2016 0933   CL 105 12/24/2015 1716   CO2 32 (H) 03/06/2016 0933   BUN 10.8 03/06/2016 0933   CREATININE 0.7 03/06/2016 0933   GLU 82 07/19/2015      Component Value Date/Time   CALCIUM 9.2 03/06/2016 0933   ALKPHOS 119 03/06/2016 0933   AST 17 03/06/2016 0933   ALT 10 03/06/2016 0933   BILITOT 0.31 03/06/2016 0933       RADIOGRAPHIC STUDIES: I have personally reviewed the radiological images as listed and agreed with the findings in the report. Ct Soft Tissue Neck W Contrast  Result Date: 03/25/2016 CLINICAL DATA:  History of supraglottic carcinoma. Evaluate response to treatment. Chemo and radiation. EXAM: CT NECK WITH CONTRAST TECHNIQUE: Multidetector CT imaging of the neck was performed using the standard protocol following the bolus administration of intravenous contrast. CONTRAST:  77mL ISOVUE-300 IOPAMIDOL (ISOVUE-300) INJECTION 61% COMPARISON:  CT neck 01/22/2015.  PET 10/01/2015 FINDINGS: Pharynx and larynx: Previously noted large mass in the right hypopharynx has resolved. There is mild edema around the glottis due to radiation. No significant residual mass. Epiglottis mildly thickened. Uvula mildly edematous. Base of tongue normal. Nasopharynx normal. Mild edema in the vocal cords. There are small gas bubbles in the fat anterior lateral to the supraglottic region which were also present on the prior PET scan. Question small diverticula. Salivary glands: Submandibular and parotid glands normal bilaterally. Thyroid: Small thyroid gland without focal nodule Lymph nodes: Negative for lymphadenopathy. Previously noted lymph nodes in the right neck have resolved. Vascular: Carotid artery and jugular vein patent bilaterally. Limited intracranial: Negative Visualized orbits:  Negative Mastoids and visualized paranasal sinuses: Mild mucosal edema right maxillary sinus. Remaining sinuses clear. Mastoid sinus clear bilaterally. Skeleton: Negative for fracture or skeletal mass lesion. Upper chest: CT chest today reported separately. IMPRESSION: Resolution of right-sided hypo pharyngeal mass. Resolution of right-sided cervical adenopathy.  No new adenopathy. Soft tissue edema around the glottis compatible with radiation change. Electronically Signed  By: Franchot Gallo M.D.   On: 03/25/2016 12:47   Ct Chest W Contrast  Result Date: 03/25/2016 CLINICAL DATA:  Followup head neck cancer EXAM: CT CHEST WITH CONTRAST TECHNIQUE: Multidetector CT imaging of the chest was performed during intravenous contrast administration. CONTRAST:  61mL ISOVUE-300 IOPAMIDOL (ISOVUE-300) INJECTION 61% COMPARISON:  11/23/2015 FINDINGS: Cardiovascular: Heart size appears within normal limits. There is no pericardial effusion identified. There is a right chest wall port a catheter with tip in the projection of the SVC. Mediastinum/Nodes: Soft tissue mass involving the anterior mediastinum measures 4.6 by 2.6 cm, image 51 of series 2. Previously this measured 1.5 x 1.4 cm. Index right hilar node measures 1.2 cm, image 75 of series 2. Previously 1.3 cm. The index sub- carinal node measures 0.9 cm, image 74 of series 2. Previously 1.1 cm. Lungs/Pleura: No pleural effusion. Numerous pulmonary nodules are identified throughout both lungs compatible with metastatic disease. The index nodule within the left upper lobe measures 11 mm, image 41 of series 5. Previously this measured 9 mm. Index pulmonary nodule in the right lower lobe measures 2.1 cm, image 130 of series 5. Previously 9 mm. Scattered new pulmonary nodules are identified. Index new pulmonary nodule in the left lower lobe measures 11 mm, image number 136 of series 5. Upper Abdomen: Between the right hemidiaphragm and the liver is a mass measuring 4.6 x 3.3  by 3.7 cm, image 149 of series 2. This appears new from previous exam and is suspicious for sub- diaphragmatic metastasis. Alternatively this could reflect a pleural base mass within the deep anterior costophrenic sulcus. No focal liver parenchyma abnormalities noted. The adrenal glands and spleen are normal. Unremarkable appearance of the pancreas and kidneys. Musculoskeletal: No aggressive lytic or sclerotic bone lesions identified. IMPRESSION: 1. Interval progression of disease. There is enlarging anterior mediastinal soft tissue mass. Enlargement of previous pulmonary nodules as well as development of multiple new pulmonary nodules. New mass along the undersurface of the right hemidiaphragm is identified worrisome for subdiaphragmatic metastases. Electronically Signed   By: Kerby Moors M.D.   On: 03/25/2016 11:08     ASSESSMENT & PLAN:  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[place]) Unfortunately, he has signs of disease progression. Despite this, the patient wants to try for 4th line of chemotherapy. He is aware that response rate is poor, likely estimated to be 20% or less without significant improvement in quality of life for longevity. Treatment is strictly palliative in nature We discussed some of the risks, benefits, side-effects of Docetaxel.   Some of the short term side-effects included, though not limited to, risk of severe allergic reaction, fatigue, weight loss, pancytopenia, life-threatening infections, need for transfusions of blood products, nausea, vomiting, change in bowel habits, loss of hair, admission to hospital for various reasons, and risks of death.   Long term side-effects are also discussed including risks of infertility, permanent damage to nerve function, chronic fatigue, and rare secondary malignancy including bone marrow disorders.   The patient is aware that the response rates discussed earlier is not guaranteed.    After a long discussion, patient made an informed decision to  proceed with the prescribed plan of care.   Patient education material was dispensed   Malignant neoplasm metastatic to lung [facility]) He is not symptomatic from lung metastasis. Observe only.  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 I increased his prescription IR morphine to 30 mg every 6 hours as needed. If the  pain is uncontrollable in the next visit, we will consider possible radiation therapy  Anxiety disorder This is well controlled  Protein-calorie malnutrition, moderate ([place]) He has lost some weight likely due to cancer cachexia. Recommend increased oral intake as tolerated   Orders Placed This Encounter  Procedures  . CBC with Differential    Standing Status:   Standing    Number of Occurrences:   20    Standing Expiration Date:   03/27/2017  . Comprehensive metabolic panel    Standing Status:   Standing    Number of Occurrences:   20    Standing Expiration Date:   03/27/2017   All questions were answered. The patient knows to call the clinic with any problems, questions or concerns. No barriers to learning was detected. I spent 30 minutes counseling the patient face to face. The total time spent in the appointment was 40 minutes and more than 50% was on counseling and review of test results     [facility], [place], MD 03/26/2016 5:34 PM

## 2016-03-26 NOTE — Progress Notes (Signed)
  Oncology Nurse Navigator Documentation  To provide support, encouragement and care continuity, met with Mr. Matheney during Established Patient appt with Dr. Alvy Bimler.  He was accompanied by his aunt Thayer Headings. They voiced understanding of Dr. Calton Dach explanation of yesterday's CT results, that the current Keytruda treatment has not contained growth of cancerous lung nodules, that new mets are evident. Mr. Doris indicated he wanted to proceed with Taxotere, verbalized understanding of 20% chance of benefit at this point in his extensive treatment history. He and his aunt voiced understanding I will continue to be available for support as he moves forward.   [name], RN, BSN, [place] at [facility] [phone]     Navigator Location: [facility]-Med Onc [phone]/09/17 1100) Navigator Encounter Type: Clinic/MDC (03/26/16 1100)           Patient Visit Type: MedOnc (03/26/16 1100)                              Time Spent with Patient: 75 (03/26/16 1100)

## 2016-04-01 ENCOUNTER — Other Ambulatory Visit: Payer: Self-pay | Admitting: Hematology and Oncology

## 2016-04-01 ENCOUNTER — Other Ambulatory Visit: Payer: Self-pay | Admitting: *Deleted

## 2016-04-01 ENCOUNTER — Other Ambulatory Visit (HOSPITAL_BASED_OUTPATIENT_CLINIC_OR_DEPARTMENT_OTHER): Payer: Medicaid Other

## 2016-04-01 ENCOUNTER — Ambulatory Visit (HOSPITAL_BASED_OUTPATIENT_CLINIC_OR_DEPARTMENT_OTHER): Payer: Medicaid Other

## 2016-04-01 DIAGNOSIS — E43 Unspecified severe protein-calorie malnutrition: Secondary | ICD-10-CM

## 2016-04-01 DIAGNOSIS — C321 Malignant neoplasm of supraglottis: Secondary | ICD-10-CM | POA: Diagnosis not present

## 2016-04-01 DIAGNOSIS — Z452 Encounter for adjustment and management of vascular access device: Secondary | ICD-10-CM

## 2016-04-01 DIAGNOSIS — K1231 Oral mucositis (ulcerative) due to antineoplastic therapy: Secondary | ICD-10-CM

## 2016-04-01 DIAGNOSIS — C7800 Secondary malignant neoplasm of unspecified lung: Secondary | ICD-10-CM

## 2016-04-01 LAB — CBC WITH DIFFERENTIAL/PLATELET
BASO%: 0.4 % (ref 0.0–2.0)
BASOS ABS: 0 10*3/uL (ref 0.0–0.1)
EOS%: 1.7 % (ref 0.0–7.0)
Eosinophils Absolute: 0.1 10*3/uL (ref 0.0–0.5)
HEMATOCRIT: 30.6 % — AB (ref 38.4–49.9)
HEMOGLOBIN: 10.1 g/dL — AB (ref 13.0–17.1)
LYMPH#: 0.7 10*3/uL — AB (ref 0.9–3.3)
LYMPH%: 16.7 % (ref 14.0–49.0)
MCH: 31 pg (ref 27.2–33.4)
MCHC: 33.1 g/dL (ref 32.0–36.0)
MCV: 93.6 fL (ref 79.3–98.0)
MONO#: 0.4 10*3/uL (ref 0.1–0.9)
MONO%: 10.3 % (ref 0.0–14.0)
NEUT#: 3 10*3/uL (ref 1.5–6.5)
NEUT%: 70.9 % (ref 39.0–75.0)
Platelets: 217 10*3/uL (ref 140–400)
RBC: 3.27 10*6/uL — ABNORMAL LOW (ref 4.20–5.82)
RDW: 12.2 % (ref 11.0–14.6)
WBC: 4.2 10*3/uL (ref 4.0–10.3)

## 2016-04-01 LAB — COMPREHENSIVE METABOLIC PANEL
ALBUMIN: 3.2 g/dL — AB (ref 3.5–5.0)
ALK PHOS: 107 U/L (ref 40–150)
AST: 17 U/L (ref 5–34)
Anion Gap: 8 mEq/L (ref 3–11)
BILIRUBIN TOTAL: 0.39 mg/dL (ref 0.20–1.20)
BUN: 17 mg/dL (ref 7.0–26.0)
CALCIUM: 9.5 mg/dL (ref 8.4–10.4)
CO2: 35 mEq/L — ABNORMAL HIGH (ref 22–29)
CREATININE: 0.7 mg/dL (ref 0.7–1.3)
Chloride: 99 mEq/L (ref 98–109)
EGFR: >90 mL/min/{1.73_m2} (ref >90)
Glucose: 71 mg/dl (ref 70–140)
Potassium: 2.7 mEq/L — CL (ref 3.5–5.1)
Sodium: 142 mEq/L (ref 136–145)
Total Protein: 6.6 g/dL (ref 6.4–8.3)

## 2016-04-01 MED ORDER — HEPARIN SOD (PORK) LOCK FLUSH 100 UNIT/ML IV SOLN
250.0000 [IU] | INTRAVENOUS | Status: DC | PRN
  Filled 2016-04-01: qty 5

## 2016-04-01 MED ORDER — SODIUM CHLORIDE 0.9 % IJ SOLN
10.0000 mL | INTRAMUSCULAR | Status: AC | PRN
  Administered 2016-04-01: 10 mL
  Filled 2016-04-01: qty 10

## 2016-04-01 MED ORDER — POTASSIUM CHLORIDE CRYS ER 20 MEQ PO TBCR: 20.0000 meq | EXTENDED_RELEASE_TABLET | Freq: Two times a day (BID) | ORAL | 0 refills | Status: DC

## 2016-04-01 MED ORDER — HEPARIN SOD (PORK) LOCK FLUSH 100 UNIT/ML IV SOLN
500.0000 [IU] | INTRAVENOUS | Status: DC | PRN
  Administered 2016-04-01: 500 [IU]
  Filled 2016-04-01: qty 5

## 2016-04-01 NOTE — Telephone Encounter (Signed)
Pt states he has not been taking Florinef, but will restart. Instructed to take potassium 20 meq twice a day for 30 days. Pt is at [facility] and will wait for prescription

## 2016-04-23 ENCOUNTER — Other Ambulatory Visit: Payer: Self-pay

## 2016-04-23 ENCOUNTER — Ambulatory Visit: Payer: Self-pay

## 2016-04-23 ENCOUNTER — Telehealth: Payer: Self-pay | Admitting: *Deleted

## 2016-04-23 ENCOUNTER — Ambulatory Visit: Payer: Self-pay | Admitting: Hematology and Oncology

## 2016-04-23 NOTE — Telephone Encounter (Signed)
  Oncology Nurse Navigator Documentation  Received call from [name] aunt, Thayer Headings.  She informed that [name] is "not feeling well" and cannot keep today's appointments.  She provided further elaboration that he is very depressed, not in a good mind-set to begin treatment.  She requested rescheduling. I notified Dr. Alvy Bimler and Infusion.  [name], RN, BSN, [place] at [facility] [phone]   Navigator Location: [facility]Med Onc (04/23/16 0903) Navigator Encounter Type: Telephone (04/23/16 [id number]) Telephone: Incoming Call;Appt Confirmation/Clarification (04/23/16 [id number])         Patient Visit Type: MedOnc (04/23/16 [id number])                              Time Spent with Patient: 15 (04/23/16 [id number])

## 2016-04-23 NOTE — Telephone Encounter (Signed)
We are overbooked Best day to see him back would be 9/20, at around 1015 for me, 30 mins. He will need labs & flush before and treatment after Cameo can assist in rescheduling. Liliane Channel, please make sure he is not suicidal and has all appropriate help

## 2016-04-23 NOTE — Telephone Encounter (Signed)
I sent Scheduling message.

## 2016-04-25 ENCOUNTER — Telehealth: Payer: Self-pay | Admitting: *Deleted

## 2016-04-25 NOTE — Telephone Encounter (Signed)
Can you help me schedule labs, flush, see me 930 am and then chemo on 9/20? Thanks

## 2016-04-25 NOTE — Telephone Encounter (Signed)
Oncology Nurse Navigator Documentation  Called Marvin [name] to check on his well-being. He stated:  "Feeling much better" than earlier this week when he cancelled his treatment.  Indicated "my throat got real sore, I couldn't drink or eat anything, got better though".  Denied feeling depressed, wanting to hurt himself. I indicated his treatment appointment will be re-scheduled for 9/20, that he will be contacted to confirm time.  He noted he was eager to have treatment. He expressed appreciation for my call.  [name], RN, BSN, [place] at [place] [phone]

## 2016-04-28 ENCOUNTER — Telehealth: Payer: Self-pay | Admitting: Hematology and Oncology

## 2016-04-28 NOTE — Telephone Encounter (Signed)
Spoke with pt to confirm 9/20 appt date/times per los

## 2016-05-07 ENCOUNTER — Ambulatory Visit (HOSPITAL_BASED_OUTPATIENT_CLINIC_OR_DEPARTMENT_OTHER): Payer: Medicaid Other | Admitting: Hematology and Oncology

## 2016-05-07 ENCOUNTER — Telehealth: Payer: Self-pay | Admitting: *Deleted

## 2016-05-07 ENCOUNTER — Telehealth: Payer: Self-pay | Admitting: Hematology and Oncology

## 2016-05-07 ENCOUNTER — Ambulatory Visit (HOSPITAL_BASED_OUTPATIENT_CLINIC_OR_DEPARTMENT_OTHER): Payer: Medicaid Other

## 2016-05-07 ENCOUNTER — Encounter: Payer: Self-pay | Admitting: Hematology and Oncology

## 2016-05-07 ENCOUNTER — Encounter: Payer: Self-pay | Admitting: *Deleted

## 2016-05-07 ENCOUNTER — Other Ambulatory Visit: Payer: Self-pay | Admitting: Hematology and Oncology

## 2016-05-07 ENCOUNTER — Other Ambulatory Visit (HOSPITAL_BASED_OUTPATIENT_CLINIC_OR_DEPARTMENT_OTHER): Payer: Medicaid Other

## 2016-05-07 ENCOUNTER — Ambulatory Visit: Payer: Medicaid Other

## 2016-05-07 VITALS — BP 105/74 | HR 74 | Temp 98.4°F | Resp 18 | Ht 73.0 in | Wt 126.8 lb

## 2016-05-07 VITALS — BP 110/79 | HR 51 | Temp 98.2°F | Resp 16

## 2016-05-07 DIAGNOSIS — C7800 Secondary malignant neoplasm of unspecified lung: Secondary | ICD-10-CM

## 2016-05-07 DIAGNOSIS — Z5189 Encounter for other specified aftercare: Secondary | ICD-10-CM | POA: Diagnosis not present

## 2016-05-07 DIAGNOSIS — D638 Anemia in other chronic diseases classified elsewhere: Secondary | ICD-10-CM

## 2016-05-07 DIAGNOSIS — I951 Orthostatic hypotension: Secondary | ICD-10-CM

## 2016-05-07 DIAGNOSIS — F331 Major depressive disorder, recurrent, moderate: Secondary | ICD-10-CM | POA: Diagnosis not present

## 2016-05-07 DIAGNOSIS — C321 Malignant neoplasm of supraglottis: Secondary | ICD-10-CM

## 2016-05-07 DIAGNOSIS — G893 Neoplasm related pain (acute) (chronic): Secondary | ICD-10-CM | POA: Diagnosis not present

## 2016-05-07 DIAGNOSIS — E440 Moderate protein-calorie malnutrition: Secondary | ICD-10-CM | POA: Diagnosis not present

## 2016-05-07 DIAGNOSIS — E43 Unspecified severe protein-calorie malnutrition: Secondary | ICD-10-CM

## 2016-05-07 DIAGNOSIS — Z5111 Encounter for antineoplastic chemotherapy: Secondary | ICD-10-CM | POA: Diagnosis not present

## 2016-05-07 LAB — CBC WITH DIFFERENTIAL/PLATELET
BASO%: 0.9 % (ref 0.0–2.0)
BASOS ABS: 0 10*3/uL (ref 0.0–0.1)
EOS ABS: 0.1 10*3/uL (ref 0.0–0.5)
EOS%: 1.3 % (ref 0.0–7.0)
HCT: 34.5 % — ABNORMAL LOW (ref 38.4–49.9)
HGB: 11.3 g/dL — ABNORMAL LOW (ref 13.0–17.1)
LYMPH%: 14.5 % (ref 14.0–49.0)
MCH: 30.9 pg (ref 27.2–33.4)
MCHC: 32.8 g/dL (ref 32.0–36.0)
MCV: 94.2 fL (ref 79.3–98.0)
MONO#: 0.7 10*3/uL (ref 0.1–0.9)
MONO%: 13.4 % (ref 0.0–14.0)
NEUT%: 69.9 % (ref 39.0–75.0)
NEUTROS ABS: 3.7 10*3/uL (ref 1.5–6.5)
Platelets: 374 10*3/uL (ref 140–400)
RBC: 3.67 10*6/uL — AB (ref 4.20–5.82)
RDW: 15.2 % — ABNORMAL HIGH (ref 11.0–14.6)
WBC: 5.3 10*3/uL (ref 4.0–10.3)
lymph#: 0.8 10*3/uL — ABNORMAL LOW (ref 0.9–3.3)

## 2016-05-07 LAB — COMPREHENSIVE METABOLIC PANEL
ALT: 22 U/L (ref 0–55)
AST: 25 U/L (ref 5–34)
Albumin: 2.6 g/dL — ABNORMAL LOW (ref 3.5–5.0)
Alkaline Phosphatase: 112 U/L (ref 40–150)
Anion Gap: 9 mEq/L (ref 3–11)
BUN: 10.8 mg/dL (ref 7.0–26.0)
CHLORIDE: 97 meq/L — AB (ref 98–109)
CO2: 29 meq/L (ref 22–29)
Calcium: 9.2 mg/dL (ref 8.4–10.4)
Creatinine: 0.7 mg/dL (ref 0.7–1.3)
GLUCOSE: 135 mg/dL (ref 70–140)
POTASSIUM: 3.3 meq/L — AB (ref 3.5–5.1)
SODIUM: 136 meq/L (ref 136–145)
Total Bilirubin: 0.55 mg/dL (ref 0.20–1.20)
Total Protein: 6.8 g/dL (ref 6.4–8.3)

## 2016-05-07 MED ORDER — DOCETAXEL CHEMO INJECTION 160 MG/16ML
75.0000 mg/m2 | Freq: Once | INTRAVENOUS | Status: AC
  Administered 2016-05-07: 130 mg via INTRAVENOUS
  Filled 2016-05-07: qty 13

## 2016-05-07 MED ORDER — HEPARIN SOD (PORK) LOCK FLUSH 100 UNIT/ML IV SOLN
500.0000 [IU] | Freq: Once | INTRAVENOUS | Status: AC | PRN
  Administered 2016-05-07: 500 [IU]
  Filled 2016-05-07: qty 5

## 2016-05-07 MED ORDER — SODIUM CHLORIDE 0.9 % IV SOLN
10.0000 mg | Freq: Once | INTRAVENOUS | Status: AC
  Administered 2016-05-07: 10 mg via INTRAVENOUS
  Filled 2016-05-07: qty 1

## 2016-05-07 MED ORDER — PEGFILGRASTIM 6 MG/0.6ML ~~LOC~~ PSKT
6.0000 mg | PREFILLED_SYRINGE | Freq: Once | SUBCUTANEOUS | Status: AC
  Administered 2016-05-07: 6 mg via SUBCUTANEOUS
  Filled 2016-05-07: qty 0.6

## 2016-05-07 MED ORDER — SODIUM CHLORIDE 0.9 % IV SOLN
Freq: Once | INTRAVENOUS | Status: AC
  Administered 2016-05-07: 10:00:00 via INTRAVENOUS

## 2016-05-07 MED ORDER — MORPHINE SULFATE ER 60 MG PO TBCR: 60.0000 mg | EXTENDED_RELEASE_TABLET | Freq: Two times a day (BID) | ORAL | 0 refills | Status: DC

## 2016-05-07 MED ORDER — MORPHINE SULFATE 30 MG PO TABS: 30.0000 mg | ORAL_TABLET | Freq: Four times a day (QID) | ORAL | 0 refills | Status: DC | PRN

## 2016-05-07 MED ORDER — SODIUM CHLORIDE 0.9% FLUSH
10.0000 mL | INTRAVENOUS | Status: DC | PRN
  Administered 2016-05-07: 10 mL
  Filled 2016-05-07: qty 10

## 2016-05-07 MED ORDER — SODIUM CHLORIDE 0.9 % IJ SOLN
10.0000 mL | INTRAMUSCULAR | Status: DC | PRN
  Administered 2016-05-07: 10 mL
  Filled 2016-05-07: qty 10

## 2016-05-07 MED FILL — MORPHINE SULF 60 MG TAB SA: 60 | 30 days supply | Qty: 60 | Fill #0

## 2016-05-07 MED FILL — MORPHINE SULFATE IR 30 MG T: 30 | 22 days supply | Qty: 90 | Fill #0

## 2016-05-07 NOTE — Progress Notes (Signed)
[facility] OFFICE PROGRESS NOTE  Patient Care Team: No Pcp Per Patient as PCP - General (General Practice) [name], MD as Consulting Physician (Otolaryngology) [name], MD as Attending Physician (Radiation Oncology) [name], MD as Consulting Physician (Hematology and Oncology) Leota Sauers, RN as Oncology Nurse Navigator Lenn Cal, DDS as Consulting Physician (Dentistry)  SUMMARY OF ONCOLOGIC HISTORY: Oncology History   Carcinoma of supraglottis   Staging form: Larynx - Supraglottis, AJCC 7th Edition     Clinical stage from 03/13/2015: Stage IVA (T3, N2b, M0) - Signed by [name], MD on 03/13/2015     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[place])   01/22/2015 Imaging    CT scan showed enhancing infiltrative mass centered at the right piriform sinus with bilateral neck lymphadenopathy      02/22/2015 - 03/08/2015 Hospital Admission    He was admitted to the hospital for management of advanced supraglottic cancer status post tracheostomy, biopsy, dental extraction, placement of feeding tube and subsequent discharge to skilled nursing facility      02/22/2015 Pathology Results    Accession: [id number] biopsy of supraglottic region 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      03/09/2015 PET scan    1)  Right piriform sinus mass with right level 2 nodal metastasis.  2)  Hypermetabolic thoracic nodes which are at least partially felt to be reactive.  3)  No evidence of subdiaphragmatic metastatic disease.       03/16/2015 Procedure    He has placement of port      03/21/2015 - 05/04/2015 Chemotherapy    He received high dose cisplatin x 3 cycles      03/21/2015 - 05/09/2015 Radiation Therapy    Tomotherapy to supraglottic tumor and bilateral neck:  70 Gy in 35 fractions to gross disease, 63 Gy in 35 fractions to high risk nodal echelons, and 56 Gy in 35 fractions to  intermediate risk nodal echelons.      05/09/2015 Miscellaneous    He went to the ED with fall      06/20/2015 Procedure    Tracheostomy was removed      09/06/2015 - 09/08/2015 Hospital Admission     he was admitted to the hospital with alcohol intoxication, dehydration and pain. CT scan of the chest show bilateral metastatic disease to the lung      10/01/2015 Imaging     PET CT scan showed diffuse, multiple hypermetabolic activity in both lungs suspicious for metastatic cancer      10/01/2015 Adverse Reaction    he received cetuximab, complicated by anaphylactic reaction      10/01/2015 - 10/03/2015 Hospital Admission    He was admitted to the hospital, intubated for cardiac arrest      10/15/2015 - 11/23/2015 Chemotherapy    He began palliative chemo with carboplatin and 5FU      10/29/2015 Adverse Reaction    5FU dose reduce by 10% due to nausea and vomiting      11/23/2015 Imaging    Ct chest showed disease progression      01/02/2016 - 03/06/2016 Chemotherapy    He received Keytruda      03/25/2016 Imaging    Interval progression of disease. There is enlarging anterior mediastinal soft tissue mass. Enlargement of previous pulmonary nodules as well as development of multiple new pulmonary nodules. New mass along the undersurface  of the right hemidiaphragm       03/25/2016 Imaging    CT neck showed resolution of right-sided hypo pharyngeal mass. Resolution of right-sided cervical adenopathy. No new adenopathy. Soft tissue edema around the glottis compatible with radiation change.      05/07/2016 -  Chemotherapy    The patient had palliative chemo with taxotere. First dose was delayed due to patient not feeling well       INTERVAL HISTORY: Please see below for problem oriented charting. He returns for follow-up. He misses first dose of treatment recently due to feeling unwell with streptococcal throat infection He lost some weight. He has worsening chest wall pain, rated  his pain at 5 out of 10. The current prescription IR morphine 30 mg is not helping much. He would wake up at night with pain. He denies nausea or constipation. Denies recent smoking or drinking. his mood is stable. Denies depression or suicidal ideation  REVIEW OF SYSTEMS:   Constitutional: Denies fevers, chills or abnormal weight loss Eyes: Denies blurriness of vision Ears, nose, mouth, throat, and face: Denies mucositis or sore throat Respiratory: Denies cough, dyspnea or wheezes Cardiovascular: Denies palpitation, chest discomfort or lower extremity swelling Gastrointestinal:  Denies nausea, heartburn or change in bowel habits Skin: Denies abnormal skin rashes Lymphatics: Denies new lymphadenopathy or easy bruising Neurological:Denies numbness, tingling or new weaknesses Behavioral/Psych: Mood is stable, no new changes  All other systems were reviewed with the patient and are negative.  I have reviewed the past medical history, past surgical history, social history and family history with the patient and they are unchanged from previous note.  ALLERGIES:  is allergic to cetuximab.  MEDICATIONS:  Current Outpatient Prescriptions  Medication Sig Dispense Refill  . dexamethasone (DECADRON) 4 MG tablet Take 2 tablets (8 mg total) by mouth 2 (two) times daily. Take 2 tablets twice daily after chemo for 2 days. 30 tablet 1  . fludrocortisone (FLORINEF) 0.1 MG tablet Take 2 tablets (0.2 mg total) by mouth daily. 90 tablet 6  . FLUoxetine (PROZAC) 40 MG capsule Take 40 mg by mouth daily.    Marland Kitchen levothyroxine (SYNTHROID) 50 MCG tablet Take 1 tablet (50 mcg total) by mouth daily before breakfast. 30 tablet 3  . morphine (MSIR) 30 MG tablet Take 1 tablet (30 mg total) by mouth every 6 (six) hours as needed for severe pain. 90 tablet 0  . ondansetron (ZOFRAN) 8 MG tablet Take 1 tablet (8 mg total) by mouth every 8 (eight) hours as needed for nausea. 90 tablet 3  . potassium chloride SA  (K-DUR,KLOR-CON) 20 MEQ tablet TAKE 1 TABLET BY MOUTH TWICE DAILY 180 tablet 0  . prochlorperazine (COMPAZINE) 10 MG tablet Take 1 tablet (10 mg total) by mouth every 6 (six) hours as needed. 90 tablet 3  . prochlorperazine (COMPAZINE) 10 MG tablet TAKE 1 TABLET BY MOUTH EVERY 6 HOURS AS NEEDED 360 tablet 3  . senna (SENOKOT) 8.6 MG TABS tablet Take 1 tablet (8.6 mg total) by mouth 2 (two) times daily. (Patient taking differently: Take 1 tablet by mouth 2 (two) times daily as needed for mild constipation (when taking pain medication.). ) 60 each 0  . traZODone (DESYREL) 100 MG tablet Take 1 tablet (100 mg total) by mouth at bedtime as needed for sleep. 30 tablet 3  . triamcinolone cream (KENALOG) 0.1 % Apply topically 2 (two) times daily. 30 g 0  . morphine (MS CONTIN) 60 MG 12 hr tablet Take 1 tablet (  60 mg total) by mouth every 12 (twelve) hours. 60 tablet 0   No current facility-administered medications for this visit.    Facility-Administered Medications Ordered in Other Visits  Medication Dose Route Frequency Provider Last Rate Last Dose  . 0.9 %  sodium chloride infusion   Intravenous Once [name], MD      . heparin lock flush 100 unit/mL  500 Units Intracatheter PRN [name], MD   500 Units at 04/01/16 1242  . heparin lock flush 100 unit/mL  250 Units Intracatheter PRN [name], MD      . sodium chloride flush (NS) 0.9 % injection 10 mL  10 mL Intravenous PRN [name], MD   10 mL at 10/01/15 0956    PHYSICAL EXAMINATION: ECOG PERFORMANCE STATUS: 1 - Symptomatic but completely ambulatory  Vitals:   05/07/16 0910  BP: 105/74  Pulse: 74  Resp: 18  Temp: 98.4 F (36.9 C)   Filed Weights   05/07/16 0910  Weight: 126 lb 12.8 oz (57.5 kg)    GENERAL:alert, no distress and comfortable SKIN: skin color, texture, turgor are normal, no rashes or significant lesions EYES: normal, Conjunctiva are pink and non-injected, sclera clear OROPHARYNX:no exudate, no erythema and lips,  buccal mucosa, and tongue normal  NECK: supple, thyroid normal size, non-tender, without nodularity LYMPH:  no palpable lymphadenopathy in the cervical, axillary or inguinal LUNGS: clear to auscultation and percussion with normal breathing effort HEART: regular rate & rhythm and no murmurs and no lower extremity edema ABDOMEN:abdomen soft, non-tender and normal bowel sounds Musculoskeletal:no cyanosis of digits and no clubbing  NEURO: alert & oriented x 3 with fluent speech, no focal motor/sensory deficits  LABORATORY DATA:  I have reviewed the data as listed    Component Value Date/Time   NA 142 04/01/2016 1152   K 2.7 (LL) 04/01/2016 1152   CL 105 12/24/2015 1716   CO2 35 (H) 04/01/2016 1152   GLUCOSE 71 04/01/2016 1152   BUN 17.0 04/01/2016 1152   CREATININE 0.7 04/01/2016 1152   CALCIUM 9.5 04/01/2016 1152   PROT 6.6 04/01/2016 1152   ALBUMIN 3.2 (L) 04/01/2016 1152   AST 17 04/01/2016 1152   ALT <9 04/01/2016 1152   ALKPHOS 107 04/01/2016 1152   BILITOT 0.39 04/01/2016 1152   GFRNONAA >60 12/24/2015 1716   GFRAA >60 12/24/2015 1716    No results found for: SPEP, UPEP  Lab Results  Component Value Date   WBC 5.3 05/07/2016   NEUTROABS 3.7 05/07/2016   HGB 11.3 (L) 05/07/2016   HCT 34.5 (L) 05/07/2016   MCV 94.2 05/07/2016   PLT 374 05/07/2016      Chemistry      Component Value Date/Time   NA 142 04/01/2016 1152   K 2.7 (LL) 04/01/2016 1152   CL 105 12/24/2015 1716   CO2 35 (H) 04/01/2016 1152   BUN 17.0 04/01/2016 1152   CREATININE 0.7 04/01/2016 1152   GLU 82 07/19/2015      Component Value Date/Time   CALCIUM 9.5 04/01/2016 1152   ALKPHOS 107 04/01/2016 1152   AST 17 04/01/2016 1152   ALT <9 04/01/2016 1152   BILITOT 0.39 04/01/2016 1152       ASSESSMENT & PLAN:  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[place]) The patient have reasonable performance status. He understood the goals of care. We will proceed with cycle 1 of Taxotere today. I will see him back  in 3 weeks prior to cycle 2 of treatment  Cancer  associated pain He has worsening chest wall pain likely due to cancer. I will start him back on MS Contin 60 mg extended release dose and refill his prescription of breakthrough pain medicine  Protein-calorie malnutrition, moderate (HCC) He has lost some weight likely due to cancer cachexia. Recommend increased oral intake as tolerated  Anemia of chronic illness This is likely due to recent treatment. The patient denies recent history of bleeding such as epistaxis, hematuria or hematochezia. He is asymptomatic from the anemia. I will observe for now.  He does not require transfusion now. I will continue the chemotherapy at current dose without dosage adjustment.  If the anemia gets progressive worse in the future, I might have to delay his treatment or adjust the chemotherapy dose.  Major depressive disorder, recurrent episode, moderate ([place])  He felt that his depression is better controlled with recent changes of his antidepressants and he is not suicidal. He will continue medication as prescribed by recent psychiatrist I reinforced the importance of him getting a consistent follow-up appointment for medication adjustment  Orthostatic hypotension His blood pressure is low today  He will continue on Florinef and are recommended increase oral intake as tolerated   No orders of the defined types were placed in this encounter.  All questions were answered. The patient knows to call the clinic with any problems, questions or concerns. No barriers to learning was detected. I spent 30 minutes counseling the patient face to face. The total time spent in the appointment was 40 minutes and more than 50% was on counseling and review of test results     [facility], [place], MD 05/07/2016 9:36 AM

## 2016-05-07 NOTE — Assessment & Plan Note (Signed)
This is likely due to recent treatment. The patient denies recent history of bleeding such as epistaxis, hematuria or hematochezia. He is asymptomatic from the anemia. I will observe for now.  He does not require transfusion now. I will continue the chemotherapy at current dose without dosage adjustment.  If the anemia gets progressive worse in the future, I might have to delay his treatment or adjust the chemotherapy dose.  

## 2016-05-07 NOTE — Assessment & Plan Note (Signed)
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 Recommend increased oral intake as tolerated

## 2016-05-07 NOTE — Telephone Encounter (Signed)
Pt's aunt Thayer Headings, asked about pt's prognosis today.  She did not want to ask if front of pt but his siblings want to know what to expect. I asked Dr. Alvy Bimler about pt's prognosis.  She thinks if the Taxotere "works" then best case is pt may live for 6 to 12 months.   She says if Taxotere does not work or if pt does not take it, then his prognosis is likely less than 6 months.   I called Thayer Headings and shared with her what Dr. Alvy Bimler said.  Discussed Hospice Care at the time pt is no longer on any treatment.  Thayer Headings was appreciative of the information and will share w/ pt's brother and sister.  She doesn't want pt to know about discussion unless he asks.  She says she doesn't think pt wants to know.

## 2016-05-07 NOTE — Progress Notes (Signed)
Spoke with Dr Alvy Bimler; told her patient's BP 91/59; states that he has chronic hypotension and it is ok to precede with Taxotere as ordered.

## 2016-05-07 NOTE — Assessment & Plan Note (Signed)
His blood pressure is low today  He will continue on Florinef and are recommended increase oral intake as tolerated

## 2016-05-07 NOTE — Assessment & Plan Note (Signed)
He has worsening chest wall pain likely due to cancer. I will start him back on MS Contin 60 mg extended release dose and refill his prescription of breakthrough pain medicine

## 2016-05-07 NOTE — Telephone Encounter (Signed)
Message sent to infusion scheduler to add infusion.  Avs report and appointment schedule given to patient,per 05/07/16 los. °

## 2016-05-07 NOTE — Patient Instructions (Signed)

## 2016-05-07 NOTE — Assessment & Plan Note (Signed)
The patient have reasonable performance status. He understood the goals of care. We will proceed with cycle 1 of Taxotere today. I will see him back in 3 weeks prior to cycle 2 of treatment

## 2016-05-07 NOTE — Patient Instructions (Signed)
Docetaxel injection  What is this medicine?  DOCETAXEL (doe se TAX el) is a chemotherapy drug. It targets fast dividing cells, like cancer cells, and causes these cells to die. This medicine is used to treat many types of cancers like breast cancer, certain stomach cancers, head and neck cancer, lung cancer, and prostate cancer.  This medicine may be used for other purposes; ask your health care provider or pharmacist if you have questions.  What should I tell my health care provider before I take this medicine?  They need to know if you have any of these conditions:  -infection (especially a virus infection such as chickenpox, cold sores, or herpes)  -liver disease  -low blood counts, like low white cell, platelet, or red cell counts  -an unusual or allergic reaction to docetaxel, polysorbate 80, other chemotherapy agents, other medicines, foods, dyes, or preservatives  -pregnant or trying to get pregnant  -breast-feeding  How should I use this medicine?  This drug is given as an infusion into a vein. It is administered in a hospital or clinic by a specially trained health care professional.  Talk to your pediatrician regarding the use of this medicine in children. Special care may be needed.  Overdosage: If you think you have taken too much of this medicine contact a poison control center or emergency room at once.  NOTE: This medicine is only for you. Do not share this medicine with others.  What if I miss a dose?  It is important not to miss your dose. Call your doctor or health care professional if you are unable to keep an appointment.  What may interact with this medicine?  -cyclosporine  -erythromycin  -ketoconazole  -medicines to increase blood counts like filgrastim, pegfilgrastim, sargramostim  -vaccines  Talk to your doctor or health care professional before taking any of these medicines:  -acetaminophen  -aspirin  -ibuprofen  -ketoprofen  -naproxen  This list may not describe all possible interactions.  Give your health care provider a list of all the medicines, herbs, non-prescription drugs, or dietary supplements you use. Also tell them if you smoke, drink alcohol, or use illegal drugs. Some items may interact with your medicine.  What should I watch for while using this medicine?  Your condition will be monitored carefully while you are receiving this medicine. You will need important blood work done while you are taking this medicine.  This drug may make you feel generally unwell. This is not uncommon, as chemotherapy can affect healthy cells as well as cancer cells. Report any side effects. Continue your course of treatment even though you feel ill unless your doctor tells you to stop.  In some cases, you may be given additional medicines to help with side effects. Follow all directions for their use.  Call your doctor or health care professional for advice if you get a fever, chills or sore throat, or other symptoms of a cold or flu. Do not treat yourself. This drug decreases your body's ability to fight infections. Try to avoid being around people who are sick.  This medicine may increase your risk to bruise or bleed. Call your doctor or health care professional if you notice any unusual bleeding.  This medicine may contain alcohol in the product. You may get drowsy or dizzy. Do not drive, use machinery, or do anything that needs mental alertness until you know how this medicine affects you. Do not stand or sit up quickly, especially if you are an older   patient. This reduces the risk of dizzy or fainting spells. Avoid alcoholic drinks.  Do not become pregnant while taking this medicine. Women should inform their doctor if they wish to become pregnant or think they might be pregnant. There is a potential for serious side effects to an unborn child. Talk to your health care professional or pharmacist for more information. Do not breast-feed an infant while taking this medicine.  What side effects may I notice  from receiving this medicine?  Side effects that you should report to your doctor or health care professional as soon as possible:  -allergic reactions like skin rash, itching or hives, swelling of the face, lips, or tongue  -low blood counts - This drug may decrease the number of white blood cells, red blood cells and platelets. You may be at increased risk for infections and bleeding.  -signs of infection - fever or chills, cough, sore throat, pain or difficulty passing urine  -signs of decreased platelets or bleeding - bruising, pinpoint red spots on the skin, black, tarry stools, nosebleeds  -signs of decreased red blood cells - unusually weak or tired, fainting spells, lightheadedness  -breathing problems  -fast or irregular heartbeat  -low blood pressure  -mouth sores  -nausea and vomiting  -pain, swelling, redness or irritation at the injection site  -pain, tingling, numbness in the hands or feet  -swelling of the ankle, feet, hands  -weight gain  Side effects that usually do not require medical attention (report to your prescriber or health care professional if they continue or are bothersome):  -bone pain  -complete hair loss including hair on your head, underarms, pubic hair, eyebrows, and eyelashes  -diarrhea  -excessive tearing  -changes in the color of fingernails  -loosening of the fingernails  -nausea  -muscle pain  -red flush to skin  -sweating  -weak or tired  This list may not describe all possible side effects. Call your doctor for medical advice about side effects. You may report side effects to FDA at 1-800-FDA-1088.  Where should I keep my medicine?  This drug is given in a hospital or clinic and will not be stored at home.  NOTE: This sheet is a summary. It may not cover all possible information. If you have questions about this medicine, talk to your doctor, pharmacist, or health care provider.      2016, [employer]. (2014-08-21 16:04:57)

## 2016-05-07 NOTE — Assessment & Plan Note (Signed)
He felt that his depression is better controlled with recent changes of his antidepressants and he is not suicidal. He will continue medication as prescribed by recent psychiatrist I reinforced the importance of him getting a consistent follow-up appointment for medication adjustment

## 2016-05-08 ENCOUNTER — Telehealth: Payer: Self-pay | Admitting: *Deleted

## 2016-05-08 NOTE — Progress Notes (Signed)
Oncology Nurse Navigator Documentation  Met with Marvin [name] during appt with Dr. Alvy Bimler. He is proceeding with first Taxotere today. He denied need/concerns, understands he can contact me.  [name], RN, BSN, [place] at [place] [phone]

## 2016-05-08 NOTE — Telephone Encounter (Signed)
Per LOS I have scheduled appts and notified the scheduler 

## 2016-05-16 ENCOUNTER — Encounter (HOSPITAL_COMMUNITY): Payer: Self-pay | Admitting: Emergency Medicine

## 2016-05-16 ENCOUNTER — Ambulatory Visit: Payer: Self-pay | Admitting: Hematology and Oncology

## 2016-05-16 ENCOUNTER — Ambulatory Visit (HOSPITAL_COMMUNITY)
Admission: EM | Admit: 2016-05-16 | Discharge: 2016-05-16 | Disposition: A | Discharge status: Home / Self Care | Payer: Medicaid Other | Attending: Family Medicine | Admitting: Family Medicine

## 2016-05-16 ENCOUNTER — Other Ambulatory Visit: Payer: Self-pay

## 2016-05-16 DIAGNOSIS — J069 Acute upper respiratory infection, unspecified: Secondary | ICD-10-CM

## 2016-05-16 DIAGNOSIS — J029 Acute pharyngitis, unspecified: Secondary | ICD-10-CM | POA: Diagnosis present

## 2016-05-16 LAB — POCT RAPID STREP A: STREPTOCOCCUS, GROUP A SCREEN (DIRECT): NEGATIVE

## 2016-05-16 MED ORDER — IPRATROPIUM BROMIDE 0.06 % NA SOLN: 2.0000 | Freq: Four times a day (QID) | NASAL | 1 refills | Status: AC

## 2016-05-16 MED ORDER — AZITHROMYCIN 250 MG PO TABS: ORAL_TABLET | ORAL | 0 refills | Status: DC

## 2016-05-16 NOTE — ED Triage Notes (Signed)
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 NAD

## 2016-05-16 NOTE — ED Provider Notes (Signed)
[facility]    CSN: [id number] Arrival date & time: 05/16/16  [id number]     History   Chief Complaint Chief Complaint  Patient presents with  . Sore Throat    HPI Marvin [name] is a 40 y.o. male.   The history is provided by the patient.  Sore Throat  This is a new problem. The current episode started more than 1 week ago. The problem has not changed since onset.The symptoms are aggravated by swallowing.    Past Medical History:  Diagnosis Date  . Alcohol dependence ([place]) 04/17/2012  . 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 Anxiety   . Anxiety disorder 03/12/2015  . Cancer ([place])   . COPD (chronic obstructive pulmonary disease) ([place])   . ETOH abuse   . Fatigue 11/23/2015  . GERD (gastroesophageal reflux disease)   . Headache 05/10/2015  . Hypokalemia 05/10/2015  . Seizures ([place])    one episode 09/2012, suspected related to ETOH withdrawal  . Severe major depression, single episode, without psychotic features ([place]) 09/13/2015  . Shortness of breath dyspnea    occ  . Throat cancer [facility])     Patient Active Problem List   Diagnosis Date Noted  . Elevated liver enzymes 01/03/2016  . Fatigue 11/23/2015  . Pancytopenia due to antineoplastic chemotherapy ([place]) 11/05/2015  . Oral mucositis due to antineoplastic therapy 10/29/2015  . Chemotherapy induced nausea and vomiting 10/29/2015  . Chest wall pain 10/15/2015  . Anemia of chronic illness 10/15/2015  . Lactic acidosis   . Hypokalemia 10/01/2015  . Palliative care encounter 09/21/2015  . Major depressive disorder, recurrent episode, moderate ([place]) 09/17/2015  . Cancer associated pain 09/15/2015  . Alcohol use disorder, severe, dependence ([place]) 09/13/2015  . Pancytopenia, acquired ([place]) 09/11/2015  . Cancer of supraglottis ([place])   . Malignant neoplasm metastatic to lung ([place])   . Hypotension due to drugs 09/06/2015  . Hypotension, postural 04/10/2015  . Orthostatic hypotension 03/28/2015  . GERD  (gastroesophageal reflux disease) 03/12/2015  . Anxiety disorder 03/12/2015  . Tobacco abuse 03/12/2015  . 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 Protein-calorie malnutrition, moderate ([place]) 02/27/2015  . Malnutrition ([place])   . 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[place]) 02/22/2015    Past Surgical History:  Procedure Laterality Date  . APPENDECTOMY    . DIRECT LARYNGOSCOPY N/A 02/22/2015   Procedure: DIRECT LARYNGOSCOPY;  Surgeon: [name], MD;  Location: [facility];  Service: ENT;  Laterality: N/A;  direct laryngoscopy with biopsy  . KNEE SURGERY Left    kneecap -screws  . MULTIPLE EXTRACTIONS WITH ALVEOLOPLASTY N/A 03/05/2015   Procedure: Extraction of tooth #'s 1,2,8,9,15,16,17,18,31,32 with alveoloplasty, mandibular right lateral exostoses, and gross debridement of remaining teeth;  Surgeon: Lenn Cal, DDS;  Location: [facility];  Service: Oral Surgery;  Laterality: N/A;  . TRACHEOSTOMY TUBE PLACEMENT N/A 02/22/2015   Procedure: TRACHEOSTOMY;  Surgeon: [name], MD;  Location: [facility];  Service: ENT;  Laterality: N/A;  awake tracheostomy       Home Medications    Prior to Admission medications   Medication Sig Start Date End Date Taking? Authorizing Provider  dexamethasone (DECADRON) 4 MG tablet Take 2 tablets (8 mg total) by mouth 2 (two) times daily. Take 2 tablets twice daily after chemo for 2 days. 03/26/16  Yes [name], MD  FLUoxetine (PROZAC) 40 MG capsule Take 40 mg by mouth daily.   Yes Historical Provider, MD  levothyroxine (SYNTHROID) 50 MCG tablet Take 1 tablet (50 mcg total) by  mouth daily before breakfast. 02/13/16  Yes [name], MD  morphine (MS CONTIN) 60 MG 12 hr tablet Take 1 tablet (60 mg total) by mouth every 12 (twelve) hours. 05/07/16  Yes [name], MD  morphine (MSIR) 30 MG tablet Take 1 tablet (30 mg total) by mouth every 6 (six) hours as needed for severe pain. 05/07/16  Yes [name], MD  potassium chloride SA (K-DUR,KLOR-CON) 20 MEQ tablet TAKE 1 TABLET BY MOUTH  TWICE DAILY 04/01/16  Yes [name], MD  prochlorperazine (COMPAZINE) 10 MG tablet Take 1 tablet (10 mg total) by mouth every 6 (six) hours as needed. 01/02/16  Yes [name], MD  prochlorperazine (COMPAZINE) 10 MG tablet TAKE 1 TABLET BY MOUTH EVERY 6 HOURS AS NEEDED 03/02/16  Yes [name], MD  senna (SENOKOT) 8.6 MG TABS tablet Take 1 tablet (8.6 mg total) by mouth 2 (two) times daily. Patient taking differently: Take 1 tablet by mouth 2 (two) times daily as needed for mild constipation (when taking pain medication.).  10/15/15  Yes [name], MD  traZODone (DESYREL) 100 MG tablet Take 1 tablet (100 mg total) by mouth at bedtime as needed for sleep. 01/23/16  Yes [name], MD  triamcinolone cream (KENALOG) 0.1 % Apply topically 2 (two) times daily. 12/24/15  Yes Daleen Bo, MD  azithromycin (ZITHROMAX Z-PAK) 250 MG tablet Take as directed on pack 05/16/16   Billy Fischer, MD  fludrocortisone (FLORINEF) 0.1 MG tablet Take 2 tablets (0.2 mg total) by mouth daily. 11/05/15   [name], MD  ipratropium (ATROVENT) 0.06 % nasal spray Place 2 sprays into both nostrils 4 (four) times daily. 05/16/16   Billy Fischer, MD  ondansetron (ZOFRAN) 8 MG tablet Take 1 tablet (8 mg total) by mouth every 8 (eight) hours as needed for nausea. 01/02/16   [name], MD    Family History Family History  Problem Relation Age of Onset  . Cancer Mother   . Heart attack Maternal Grandmother     age of death 68  . Heart attack Maternal Grandfather   . Cancer Paternal Grandfather     Social History Social History  Substance Use Topics  . Smoking status: Former Smoker    Packs/day: 1.00    Years: 28.00    Types: Cigarettes    Quit date: 01/17/2015  . Smokeless tobacco: Never Used  . Alcohol use No     Comment: 6 -40-oz. beers daily     Allergies   Cetuximab   Review of Systems Review of Systems  Constitutional: Negative.  Negative for chills and fever.  HENT: Positive for congestion, postnasal drip and  rhinorrhea.   All other systems reviewed and are negative.    Physical Exam Triage Vital Signs ED Triage Vitals  Enc Vitals Group     BP 05/16/16 1012 96/65     Pulse Rate 05/16/16 1012 95     Resp 05/16/16 1012 18     Temp 05/16/16 1012 98.4 F (36.9 C)     Temp Source 05/16/16 1012 Oral     SpO2 05/16/16 1012 96 %     Weight --      Height --      Head Circumference --      Peak Flow --      Pain Score 05/16/16 1014 5     Pain Loc --      Pain Edu? --      Excl. in [place]? --  No data found.   Updated Vital Signs BP 96/65 (BP Location: Left Arm)   Pulse 95   Temp 98.4 F (36.9 C) (Oral)   Resp 18   SpO2 96%   Visual Acuity Right Eye Distance:   Left Eye Distance:   Bilateral Distance:    Right Eye Near:   Left Eye Near:    Bilateral Near:     Physical Exam  Constitutional: He is oriented to person, place, and time. He appears well-developed and well-nourished. No distress.  HENT:  Right Ear: External ear normal.  Left Ear: External ear normal.  Nose: Mucosal edema and rhinorrhea present.  Mouth/Throat: Oropharynx is clear and moist.  Neck: Normal range of motion. Neck supple.  Lymphadenopathy:    He has no cervical adenopathy.  Neurological: He is alert and oriented to person, place, and time.  Skin: Skin is warm and dry.  Nursing note and vitals reviewed.    UC Treatments / Results  Labs (all labs ordered are listed, but only abnormal results are displayed) Labs Reviewed  POCT RAPID STREP A    EKG  EKG Interpretation None       Radiology No results found.  Procedures Procedures (including critical care time)  Medications Ordered in UC Medications - No data to display   Initial Impression / Assessment and Plan / UC Course  I have reviewed the triage vital signs and the nursing notes.  Pertinent labs & imaging results that were available during my care of the patient were reviewed by me and considered in my medical decision making  (see chart for details).  Clinical Course      Final Clinical Impressions(s) / UC Diagnoses   Final diagnoses:  URI (upper respiratory infection)    New Prescriptions New Prescriptions   AZITHROMYCIN (ZITHROMAX Z-PAK) 250 MG TABLET    Take as directed on pack   IPRATROPIUM (ATROVENT) 0.06 % NASAL SPRAY    Place 2 sprays into both nostrils 4 (four) times daily.     Billy Fischer, MD 05/16/16 1036

## 2016-05-19 LAB — CULTURE, GROUP A STREP (THRC)

## 2016-05-29 ENCOUNTER — Telehealth: Payer: Self-pay | Admitting: Hematology and Oncology

## 2016-05-29 ENCOUNTER — Encounter: Payer: Self-pay | Admitting: Hematology and Oncology

## 2016-05-29 ENCOUNTER — Other Ambulatory Visit (HOSPITAL_BASED_OUTPATIENT_CLINIC_OR_DEPARTMENT_OTHER): Payer: Medicaid Other

## 2016-05-29 ENCOUNTER — Encounter: Payer: Self-pay | Admitting: *Deleted

## 2016-05-29 ENCOUNTER — Ambulatory Visit (HOSPITAL_BASED_OUTPATIENT_CLINIC_OR_DEPARTMENT_OTHER): Payer: Medicaid Other

## 2016-05-29 ENCOUNTER — Telehealth: Payer: Self-pay | Admitting: *Deleted

## 2016-05-29 ENCOUNTER — Ambulatory Visit: Payer: Medicaid Other

## 2016-05-29 ENCOUNTER — Ambulatory Visit (HOSPITAL_BASED_OUTPATIENT_CLINIC_OR_DEPARTMENT_OTHER): Payer: Medicaid Other | Admitting: Hematology and Oncology

## 2016-05-29 VITALS — BP 112/75 | HR 50 | Temp 98.5°F | Resp 18

## 2016-05-29 DIAGNOSIS — C7800 Secondary malignant neoplasm of unspecified lung: Secondary | ICD-10-CM

## 2016-05-29 DIAGNOSIS — Z5111 Encounter for antineoplastic chemotherapy: Secondary | ICD-10-CM | POA: Diagnosis present

## 2016-05-29 DIAGNOSIS — F331 Major depressive disorder, recurrent, moderate: Secondary | ICD-10-CM | POA: Diagnosis not present

## 2016-05-29 DIAGNOSIS — I951 Orthostatic hypotension: Secondary | ICD-10-CM

## 2016-05-29 DIAGNOSIS — C321 Malignant neoplasm of supraglottis: Secondary | ICD-10-CM | POA: Diagnosis present

## 2016-05-29 DIAGNOSIS — G893 Neoplasm related pain (acute) (chronic): Secondary | ICD-10-CM | POA: Diagnosis not present

## 2016-05-29 DIAGNOSIS — E039 Hypothyroidism, unspecified: Secondary | ICD-10-CM

## 2016-05-29 DIAGNOSIS — E440 Moderate protein-calorie malnutrition: Secondary | ICD-10-CM

## 2016-05-29 DIAGNOSIS — E43 Unspecified severe protein-calorie malnutrition: Secondary | ICD-10-CM

## 2016-05-29 DIAGNOSIS — Z5189 Encounter for other specified aftercare: Secondary | ICD-10-CM

## 2016-05-29 DIAGNOSIS — D638 Anemia in other chronic diseases classified elsewhere: Secondary | ICD-10-CM

## 2016-05-29 HISTORY — DX: Hypothyroidism, unspecified: E03.9

## 2016-05-29 LAB — COMPREHENSIVE METABOLIC PANEL
ALT: 8 U/L (ref 0–55)
AST: 17 U/L (ref 5–34)
Albumin: 2.4 g/dL — ABNORMAL LOW (ref 3.5–5.0)
Alkaline Phosphatase: 91 U/L (ref 40–150)
Anion Gap: 9 mEq/L (ref 3–11)
BUN: 10.4 mg/dL (ref 7.0–26.0)
CALCIUM: 8.8 mg/dL (ref 8.4–10.4)
CHLORIDE: 100 meq/L (ref 98–109)
CO2: 28 meq/L (ref 22–29)
Creatinine: 0.7 mg/dL (ref 0.7–1.3)
EGFR: >90 mL/min/{1.73_m2} (ref >90)
Glucose: 91 mg/dl (ref 70–140)
POTASSIUM: 3.6 meq/L (ref 3.5–5.1)
SODIUM: 138 meq/L (ref 136–145)
Total Bilirubin: 0.45 mg/dL (ref 0.20–1.20)
Total Protein: 5.9 g/dL — ABNORMAL LOW (ref 6.4–8.3)

## 2016-05-29 LAB — CBC WITH DIFFERENTIAL/PLATELET
BASO%: 0.3 % (ref 0.0–2.0)
BASOS ABS: 0 10*3/uL (ref 0.0–0.1)
EOS%: 2.7 % (ref 0.0–7.0)
Eosinophils Absolute: 0.2 10*3/uL (ref 0.0–0.5)
HEMATOCRIT: 27 % — AB (ref 38.4–49.9)
HGB: 9 g/dL — ABNORMAL LOW (ref 13.0–17.1)
LYMPH%: 12.5 % — AB (ref 14.0–49.0)
MCH: 31 pg (ref 27.2–33.4)
MCHC: 33.3 g/dL (ref 32.0–36.0)
MCV: 93.1 fL (ref 79.3–98.0)
MONO#: 1.1 10*3/uL — ABNORMAL HIGH (ref 0.1–0.9)
MONO%: 13.6 % (ref 0.0–14.0)
NEUT#: 5.5 10*3/uL (ref 1.5–6.5)
NEUT%: 70.9 % (ref 39.0–75.0)
Platelets: 363 10*3/uL (ref 140–400)
RBC: 2.9 10*6/uL — AB (ref 4.20–5.82)
RDW: 14.7 % — ABNORMAL HIGH (ref 11.0–14.6)
WBC: 7.8 10*3/uL (ref 4.0–10.3)
lymph#: 1 10*3/uL (ref 0.9–3.3)

## 2016-05-29 LAB — TSH: TSH: 5.667 m[IU]/L — AB (ref 0.320–4.118)

## 2016-05-29 MED ORDER — MORPHINE SULFATE 30 MG PO TABS: 30.0000 mg | ORAL_TABLET | Freq: Four times a day (QID) | ORAL | 0 refills | Status: DC | PRN

## 2016-05-29 MED ORDER — SODIUM CHLORIDE 0.9 % IV SOLN
10.0000 mg | Freq: Once | INTRAVENOUS | Status: AC
  Administered 2016-05-29: 10 mg via INTRAVENOUS
  Filled 2016-05-29: qty 1

## 2016-05-29 MED ORDER — PEGFILGRASTIM 6 MG/0.6ML ~~LOC~~ PSKT
6.0000 mg | PREFILLED_SYRINGE | Freq: Once | SUBCUTANEOUS | Status: AC
  Administered 2016-05-29: 6 mg via SUBCUTANEOUS
  Filled 2016-05-29: qty 0.6

## 2016-05-29 MED ORDER — HEPARIN SOD (PORK) LOCK FLUSH 100 UNIT/ML IV SOLN
500.0000 [IU] | Freq: Once | INTRAVENOUS | Status: AC | PRN
Start: 2016-05-29 — End: 2016-05-29
  Administered 2016-05-29: 500 [IU]
  Filled 2016-05-29: qty 5

## 2016-05-29 MED ORDER — SODIUM CHLORIDE 0.9% FLUSH
10.0000 mL | INTRAVENOUS | Status: DC | PRN
  Administered 2016-05-29: 10 mL
  Filled 2016-05-29: qty 10

## 2016-05-29 MED ORDER — SODIUM CHLORIDE 0.9 % IV SOLN
Freq: Once | INTRAVENOUS | Status: AC
  Administered 2016-05-29: 11:00:00 via INTRAVENOUS

## 2016-05-29 MED ORDER — DOCETAXEL CHEMO INJECTION 160 MG/16ML
75.0000 mg/m2 | Freq: Once | INTRAVENOUS | Status: AC
  Administered 2016-05-29: 130 mg via INTRAVENOUS
  Filled 2016-05-29: qty 13

## 2016-05-29 MED ORDER — SODIUM CHLORIDE 0.9 % IJ SOLN
10.0000 mL | INTRAMUSCULAR | Status: DC | PRN
  Administered 2016-05-29: 10 mL
  Filled 2016-05-29: qty 10

## 2016-05-29 MED ORDER — SODIUM CHLORIDE 0.9 % IV SOLN
INTRAVENOUS | Status: AC
  Administered 2016-05-29: 11:00:00 via INTRAVENOUS

## 2016-05-29 NOTE — Progress Notes (Signed)
Oncology Nurse Navigator Documentation  Met with [name] during follow-up appt with Dr. Alvy Bimler.  He was accompanied by his sister Marvin [name]. He reported:  Weight loss d/t "strep throat came back, couldn't eat".  Presently drinking several bottles of Boost daily.  Reduced chest pain since starting chemo  Increasing throat pain, voice change  Fatigue  Denied N&V, SOB He voiced understanding that he will receive 2nd cycle of docetaxel today, next cycle 06/19/16, followed with CT scan 2 weeks later. He understands I will coordinate follow-up including laryngoscopy with Dr. Redmond Baseman.   [name], RN, BSN, [facility] at [facility] [phone]

## 2016-05-29 NOTE — Assessment & Plan Note (Addendum)
He has better control of chest wall pain I am concerned about his sensation of sore throat. He has minor voice changes. I will get him to follow with ENT to make sure he does not have evidence of local disease recurrence in the supraglottic region I refilled his prescription of breakthrough pain medicine

## 2016-05-29 NOTE — Assessment & Plan Note (Signed)
He has fatigue. Recheck TSH show improvement. Continue same dose Synthroid for now

## 2016-05-29 NOTE — Telephone Encounter (Signed)
Gv pt appt for 06/19/16.

## 2016-05-29 NOTE — Assessment & Plan Note (Signed)
He felt that his depression is better controlled with recent changes of his antidepressants and he is not suicidal. He will continue medication as prescribed by recent psychiatrist I reinforced the importance of him getting a consistent follow-up appointment for medication adjustment

## 2016-05-29 NOTE — Progress Notes (Signed)
A user error has taken place: encounter opened in error, closed for administrative reasons.

## 2016-05-29 NOTE — Assessment & Plan Note (Signed)
He has significant low blood pressure despite Florinef with poor appetite. I recommend low-dose dexamethasone and will give him additional IV fluids today

## 2016-05-29 NOTE — Assessment & Plan Note (Signed)
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 Recommend increased oral intake as tolerated and will add dexamethasone

## 2016-05-29 NOTE — Telephone Encounter (Signed)
Oncology Nurse Navigator Documentation  Per Dr. Calton Dach guidance, called [facility]NT to arrange follow-up visit.  Spoke with Michel Bickers, requested that patient be contacted and next available appt arranged to see Dr. Redmond Baseman including laryngoscopy to assess increased throat pain and minor voice changes.   She verbalized understanding.  [name], RN, BSN, [place] at [place] [phone]

## 2016-05-29 NOTE — Patient Instructions (Signed)
Docetaxel injection  What is this medicine?  DOCETAXEL (doe se TAX el) is a chemotherapy drug. It targets fast dividing cells, like cancer cells, and causes these cells to die. This medicine is used to treat many types of cancers like breast cancer, certain stomach cancers, head and neck cancer, lung cancer, and prostate cancer.  This medicine may be used for other purposes; ask your health care provider or pharmacist if you have questions.  What should I tell my health care provider before I take this medicine?  They need to know if you have any of these conditions:  -infection (especially a virus infection such as chickenpox, cold sores, or herpes)  -liver disease  -low blood counts, like low white cell, platelet, or red cell counts  -an unusual or allergic reaction to docetaxel, polysorbate 80, other chemotherapy agents, other medicines, foods, dyes, or preservatives  -pregnant or trying to get pregnant  -breast-feeding  How should I use this medicine?  This drug is given as an infusion into a vein. It is administered in a hospital or clinic by a specially trained health care professional.  Talk to your pediatrician regarding the use of this medicine in children. Special care may be needed.  Overdosage: If you think you have taken too much of this medicine contact a poison control center or emergency room at once.  NOTE: This medicine is only for you. Do not share this medicine with others.  What if I miss a dose?  It is important not to miss your dose. Call your doctor or health care professional if you are unable to keep an appointment.  What may interact with this medicine?  -cyclosporine  -erythromycin  -ketoconazole  -medicines to increase blood counts like filgrastim, pegfilgrastim, sargramostim  -vaccines  Talk to your doctor or health care professional before taking any of these medicines:  -acetaminophen  -aspirin  -ibuprofen  -ketoprofen  -naproxen  This list may not describe all possible interactions.  Give your health care provider a list of all the medicines, herbs, non-prescription drugs, or dietary supplements you use. Also tell them if you smoke, drink alcohol, or use illegal drugs. Some items may interact with your medicine.  What should I watch for while using this medicine?  Your condition will be monitored carefully while you are receiving this medicine. You will need important blood work done while you are taking this medicine.  This drug may make you feel generally unwell. This is not uncommon, as chemotherapy can affect healthy cells as well as cancer cells. Report any side effects. Continue your course of treatment even though you feel ill unless your doctor tells you to stop.  In some cases, you may be given additional medicines to help with side effects. Follow all directions for their use.  Call your doctor or health care professional for advice if you get a fever, chills or sore throat, or other symptoms of a cold or flu. Do not treat yourself. This drug decreases your body's ability to fight infections. Try to avoid being around people who are sick.  This medicine may increase your risk to bruise or bleed. Call your doctor or health care professional if you notice any unusual bleeding.  This medicine may contain alcohol in the product. You may get drowsy or dizzy. Do not drive, use machinery, or do anything that needs mental alertness until you know how this medicine affects you. Do not stand or sit up quickly, especially if you are an older   patient. This reduces the risk of dizzy or fainting spells. Avoid alcoholic drinks.  Do not become pregnant while taking this medicine. Women should inform their doctor if they wish to become pregnant or think they might be pregnant. There is a potential for serious side effects to an unborn child. Talk to your health care professional or pharmacist for more information. Do not breast-feed an infant while taking this medicine.  What side effects may I notice  from receiving this medicine?  Side effects that you should report to your doctor or health care professional as soon as possible:  -allergic reactions like skin rash, itching or hives, swelling of the face, lips, or tongue  -low blood counts - This drug may decrease the number of white blood cells, red blood cells and platelets. You may be at increased risk for infections and bleeding.  -signs of infection - fever or chills, cough, sore throat, pain or difficulty passing urine  -signs of decreased platelets or bleeding - bruising, pinpoint red spots on the skin, black, tarry stools, nosebleeds  -signs of decreased red blood cells - unusually weak or tired, fainting spells, lightheadedness  -breathing problems  -fast or irregular heartbeat  -low blood pressure  -mouth sores  -nausea and vomiting  -pain, swelling, redness or irritation at the injection site  -pain, tingling, numbness in the hands or feet  -swelling of the ankle, feet, hands  -weight gain  Side effects that usually do not require medical attention (report to your prescriber or health care professional if they continue or are bothersome):  -bone pain  -complete hair loss including hair on your head, underarms, pubic hair, eyebrows, and eyelashes  -diarrhea  -excessive tearing  -changes in the color of fingernails  -loosening of the fingernails  -nausea  -muscle pain  -red flush to skin  -sweating  -weak or tired  This list may not describe all possible side effects. Call your doctor for medical advice about side effects. You may report side effects to FDA at 1-800-FDA-1088.  Where should I keep my medicine?  This drug is given in a hospital or clinic and will not be stored at home.  NOTE: This sheet is a summary. It may not cover all possible information. If you have questions about this medicine, talk to your doctor, pharmacist, or health care provider.      2016, [employer]. (2014-08-21 16:04:57)

## 2016-05-29 NOTE — Assessment & Plan Note (Signed)
The patient has mild decline in performance status. He understood the goals of care. We will proceed with cycle 2 of Taxotere today. I will see him back in 3 weeks prior to cycle 3 of treatment

## 2016-05-29 NOTE — Progress Notes (Signed)
[place] OFFICE PROGRESS NOTE  Patient Care Team: No Pcp Per Patient as PCP - General (General Practice) [name], MD as Consulting Physician (Otolaryngology) [name], MD as Attending Physician (Radiation Oncology) [name], MD as Consulting Physician (Hematology and Oncology) Leota Sauers, RN as Oncology Nurse Navigator Lenn Cal, DDS as Consulting Physician (Dentistry)  SUMMARY OF ONCOLOGIC HISTORY: Oncology History   Carcinoma of supraglottis   Staging form: Larynx - Supraglottis, AJCC 7th Edition     Clinical stage from 03/13/2015: Stage IVA (T3, N2b, M0) - Signed by [name], MD on 03/13/2015     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[place])   01/22/2015 Imaging    CT scan showed enhancing infiltrative mass centered at the right piriform sinus with bilateral neck lymphadenopathy      02/22/2015 - 03/08/2015 Hospital Admission    He was admitted to the hospital for management of advanced supraglottic cancer status post tracheostomy, biopsy, dental extraction, placement of feeding tube and subsequent discharge to skilled nursing facility      02/22/2015 Pathology Results    Accession: [id number] biopsy of supraglottic region 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      03/09/2015 PET scan    1)  Right piriform sinus mass with right level 2 nodal metastasis.  2)  Hypermetabolic thoracic nodes which are at least partially felt to be reactive.  3)  No evidence of subdiaphragmatic metastatic disease.       03/16/2015 Procedure    He has placement of port      03/21/2015 - 05/04/2015 Chemotherapy    He received high dose cisplatin x 3 cycles      03/21/2015 - 05/09/2015 Radiation Therapy    Tomotherapy to supraglottic tumor and bilateral neck:  70 Gy in 35 fractions to gross disease, 63 Gy in 35 fractions to high risk nodal echelons, and 56 Gy in 35 fractions to  intermediate risk nodal echelons.      05/09/2015 Miscellaneous    He went to the ED with fall      06/20/2015 Procedure    Tracheostomy was removed      09/06/2015 - 09/08/2015 Hospital Admission     he was admitted to the hospital with alcohol intoxication, dehydration and pain. CT scan of the chest show bilateral metastatic disease to the lung      10/01/2015 Imaging     PET CT scan showed diffuse, multiple hypermetabolic activity in both lungs suspicious for metastatic cancer      10/01/2015 Adverse Reaction    he received cetuximab, complicated by anaphylactic reaction      10/01/2015 - 10/03/2015 Hospital Admission    He was admitted to the hospital, intubated for cardiac arrest      10/15/2015 - 11/23/2015 Chemotherapy    He began palliative chemo with carboplatin and 5FU      10/29/2015 Adverse Reaction    5FU dose reduce by 10% due to nausea and vomiting      11/23/2015 Imaging    Ct chest showed disease progression      01/02/2016 - 03/06/2016 Chemotherapy    He received Keytruda      03/25/2016 Imaging    Interval progression of disease. There is enlarging anterior mediastinal soft tissue mass. Enlargement of previous pulmonary nodules as well as development of multiple new pulmonary nodules. New mass along the undersurface  of the right hemidiaphragm       03/25/2016 Imaging    CT neck showed resolution of right-sided hypo pharyngeal mass. Resolution of right-sided cervical adenopathy. No new adenopathy. Soft tissue edema around the glottis compatible with radiation change.      05/07/2016 -  Chemotherapy    The patient had palliative chemo with taxotere. First dose was delayed due to patient not feeling well       INTERVAL HISTORY: Please see below for problem oriented charting. He complained of persistent sore throat and has difficulties with eating due to dysgeusia. He has lost a bit of weight. He denies recent alcoholism. His depression appears to be under  control. His chest wall pain has improved and he has not needed to use any long-acting MS Contin recently. Denies nausea. Denies hemoptysis He complained of excessive fatigue  REVIEW OF SYSTEMS:   Constitutional: Denies fevers, chills  Eyes: Denies blurriness of vision Respiratory: Denies cough, dyspnea or wheezes Cardiovascular: Denies palpitation, chest discomfort or lower extremity swelling Gastrointestinal:  Denies nausea, heartburn or change in bowel habits Skin: Denies abnormal skin rashes Lymphatics: Denies new lymphadenopathy or easy bruising Neurological:Denies numbness, tingling or new weaknesses Behavioral/Psych: Mood is stable, no new changes  All other systems were reviewed with the patient and are negative.  I have reviewed the past medical history, past surgical history, social history and family history with the patient and they are unchanged from previous note.  ALLERGIES:  is allergic to cetuximab.  MEDICATIONS:  Current Outpatient Prescriptions  Medication Sig Dispense Refill  . fludrocortisone (FLORINEF) 0.1 MG tablet Take 2 tablets (0.2 mg total) by mouth daily. 90 tablet 6  . FLUoxetine (PROZAC) 40 MG capsule Take 40 mg by mouth daily.    Marland Kitchen ipratropium (ATROVENT) 0.06 % nasal spray Place 2 sprays into both nostrils 4 (four) times daily. 15 mL 1  . levothyroxine (SYNTHROID) 50 MCG tablet Take 1 tablet (50 mcg total) by mouth daily before breakfast. 30 tablet 3  . morphine (MS CONTIN) 60 MG 12 hr tablet Take 1 tablet (60 mg total) by mouth every 12 (twelve) hours. 60 tablet 0  . morphine (MSIR) 30 MG tablet Take 1 tablet (30 mg total) by mouth every 6 (six) hours as needed for severe pain. 90 tablet 0  . ondansetron (ZOFRAN) 8 MG tablet Take 1 tablet (8 mg total) by mouth every 8 (eight) hours as needed for nausea. 90 tablet 3  . potassium chloride SA (K-DUR,KLOR-CON) 20 MEQ tablet TAKE 1 TABLET BY MOUTH TWICE DAILY 180 tablet 0  . prochlorperazine (COMPAZINE) 10  MG tablet Take 1 tablet (10 mg total) by mouth every 6 (six) hours as needed. 90 tablet 3  . senna (SENOKOT) 8.6 MG TABS tablet Take 1 tablet (8.6 mg total) by mouth 2 (two) times daily. (Patient taking differently: Take 1 tablet by mouth 2 (two) times daily as needed for mild constipation (when taking pain medication.). ) 60 each 0  . traZODone (DESYREL) 100 MG tablet Take 1 tablet (100 mg total) by mouth at bedtime as needed for sleep. 30 tablet 3  . triamcinolone cream (KENALOG) 0.1 % Apply topically 2 (two) times daily. 30 g 0  . dexamethasone (DECADRON) 4 MG tablet Take 2 tablets (8 mg total) by mouth 2 (two) times daily. Take 2 tablets twice daily after chemo for 2 days. 30 tablet 1   No current facility-administered medications for this visit.    Facility-Administered Medications Ordered in Other  Visits  Medication Dose Route Frequency Provider Last Rate Last Dose  . 0.9 %  sodium chloride infusion   Intravenous Once [name], MD      . heparin lock flush 100 unit/mL  500 Units Intracatheter PRN [name], MD   500 Units at 04/01/16 1242  . heparin lock flush 100 unit/mL  250 Units Intracatheter PRN [name], MD      . sodium chloride flush (NS) 0.9 % injection 10 mL  10 mL Intravenous PRN [name], MD   10 mL at 10/01/15 0956  . sodium chloride flush (NS) 0.9 % injection 10 mL  10 mL Intracatheter PRN [name], MD   10 mL at 05/29/16 1358    PHYSICAL EXAMINATION: ECOG PERFORMANCE STATUS: 1 - Symptomatic but completely ambulatory  Vitals:   05/29/16 0930  BP: (!) 97/48  Pulse: 83  Resp: 19  Temp: 98.2 F (36.8 C)   Filed Weights   05/29/16 0930  Weight: 126 lb 4.8 oz (57.3 kg)    GENERAL:alert, no distress and comfortable. He looks thin and cachectic SKIN: Noted vitiligo EYES: normal, Conjunctiva are pink and non-injected, sclera clear OROPHARYNX:no exudate, no erythema and lips, buccal mucosa, and tongue normal  NECK: supple, thyroid normal size, non-tender,  without nodularity LYMPH:  no palpable lymphadenopathy in the cervical, axillary or inguinal LUNGS: clear to auscultation and percussion with normal breathing effort HEART: regular rate & rhythm and no murmurs and no lower extremity edema ABDOMEN:abdomen soft, non-tender and normal bowel sounds Musculoskeletal:no cyanosis of digits and no clubbing  NEURO: alert & oriented x 3 with fluent speech, no focal motor/sensory deficits. Minor hoarseness is noted  LABORATORY DATA:  I have reviewed the data as listed    Component Value Date/Time   NA 138 05/29/2016 0854   K 3.6 05/29/2016 0854   CL 105 12/24/2015 1716   CO2 28 05/29/2016 0854   GLUCOSE 91 05/29/2016 0854   BUN 10.4 05/29/2016 0854   CREATININE 0.7 05/29/2016 0854   CALCIUM 8.8 05/29/2016 0854   PROT 5.9 (L) 05/29/2016 0854   ALBUMIN 2.4 (L) 05/29/2016 0854   AST 17 05/29/2016 0854   ALT 8 05/29/2016 0854   ALKPHOS 91 05/29/2016 0854   BILITOT 0.45 05/29/2016 0854   GFRNONAA >60 12/24/2015 1716   GFRAA >60 12/24/2015 1716    No results found for: SPEP, UPEP  Lab Results  Component Value Date   WBC 7.8 05/29/2016   NEUTROABS 5.5 05/29/2016   HGB 9.0 (L) 05/29/2016   HCT 27.0 (L) 05/29/2016   MCV 93.1 05/29/2016   PLT 363 05/29/2016      Chemistry      Component Value Date/Time   NA 138 05/29/2016 0854   K 3.6 05/29/2016 0854   CL 105 12/24/2015 1716   CO2 28 05/29/2016 0854   BUN 10.4 05/29/2016 0854   CREATININE 0.7 05/29/2016 0854   GLU 82 07/19/2015      Component Value Date/Time   CALCIUM 8.8 05/29/2016 0854   ALKPHOS 91 05/29/2016 0854   AST 17 05/29/2016 0854   ALT 8 05/29/2016 0854   BILITOT 0.45 05/29/2016 0854      ASSESSMENT & PLAN:  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[place]) The patient has mild decline in performance status. He understood the goals of care. We will proceed with cycle 2 of Taxotere today. I will see him back in 3 weeks prior to cycle 3 of treatment  Malignant neoplasm  metastatic to lung (  [place]) He is not symptomatic from lung metastasis. Observe only.  Cancer associated pain He has better control of chest wall pain I am concerned about his sensation of sore throat. He has minor voice changes. I will get him to follow with ENT to make sure he does not have evidence of local disease recurrence in the supraglottic region I refilled his prescription of breakthrough pain medicine  Anemia of chronic illness This is likely due to recent treatment. The patient denies recent history of bleeding such as epistaxis, hematuria or hematochezia. He is asymptomatic from the anemia. I will observe for now.  He does not require transfusion now. I will continue the chemotherapy at current dose without dosage adjustment.  If the anemia gets progressive worse in the future, I might have to delay his treatment or adjust the chemotherapy dose.  Orthostatic hypotension He has significant low blood pressure despite Florinef with poor appetite. I recommend low-dose dexamethasone and will give him additional IV fluids today  Major depressive disorder, recurrent episode, moderate ([place])  He felt that his depression is better controlled with recent changes of his antidepressants and he is not suicidal. He will continue medication as prescribed by recent psychiatrist I reinforced the importance of him getting a consistent follow-up appointment for medication adjustment  Protein-calorie malnutrition, moderate ([place]) He has lost some weight likely due to cancer cachexia. Recommend increased oral intake as tolerated and will add dexamethasone  Acquired hypothyroidism He has fatigue. Recheck TSH show improvement. Continue same dose Synthroid for now   Orders Placed This Encounter  Procedures  . TSH    Standing Status:   Future    Number of Occurrences:   1    Standing Expiration Date:   07/03/2017  . T4, free    Standing Status:   Future    Number of Occurrences:   1    Standing  Expiration Date:   07/03/2017   All questions were answered. The patient knows to call the clinic with any problems, questions or concerns. No barriers to learning was detected. I spent 25 minutes counseling the patient face to face. The total time spent in the appointment was 40 minutes and more than 50% was on counseling and review of test results     [name], MD 05/29/2016 2:04 PM

## 2016-05-29 NOTE — Assessment & Plan Note (Signed)
He is not symptomatic from lung metastasis. Observe only.

## 2016-05-29 NOTE — Assessment & Plan Note (Signed)
This is likely due to recent treatment. The patient denies recent history of bleeding such as epistaxis, hematuria or hematochezia. He is asymptomatic from the anemia. I will observe for now.  He does not require transfusion now. I will continue the chemotherapy at current dose without dosage adjustment.  If the anemia gets progressive worse in the future, I might have to delay his treatment or adjust the chemotherapy dose.  

## 2016-05-29 NOTE — Patient Instructions (Signed)

## 2016-05-30 ENCOUNTER — Telehealth: Payer: Self-pay | Admitting: *Deleted

## 2016-05-30 LAB — T4, FREE: T4,Free(Direct): 1.24 ng/dL (ref 0.82–1.77)

## 2016-05-30 NOTE — Telephone Encounter (Signed)
Oncology Nurse Navigator Documentation  In follow-up to Marvin [name]' expressed need for nutritional supplement during yesterday's appt with Dr. Alvy Bimler, called to inform him a case of Ensure Plus is available for pick-up at [facility] lobby front desk.  He voiced appreciation.  [name], RN, BSN, [place] at [place] [phone]

## 2016-06-04 ENCOUNTER — Ambulatory Visit (HOSPITAL_COMMUNITY)
Admission: EM | Admit: 2016-06-04 | Discharge: 2016-06-04 | Disposition: A | Discharge status: Home / Self Care | Payer: Medicaid Other | Attending: Family Medicine | Admitting: Family Medicine

## 2016-06-04 ENCOUNTER — Encounter (HOSPITAL_COMMUNITY): Payer: Self-pay | Admitting: Emergency Medicine

## 2016-06-04 DIAGNOSIS — C321 Malignant neoplasm of supraglottis: Secondary | ICD-10-CM | POA: Diagnosis not present

## 2016-06-04 DIAGNOSIS — K1231 Oral mucositis (ulcerative) due to antineoplastic therapy: Secondary | ICD-10-CM | POA: Insufficient documentation

## 2016-06-04 DIAGNOSIS — D638 Anemia in other chronic diseases classified elsewhere: Secondary | ICD-10-CM | POA: Diagnosis not present

## 2016-06-04 DIAGNOSIS — E876 Hypokalemia: Secondary | ICD-10-CM | POA: Diagnosis not present

## 2016-06-04 DIAGNOSIS — F1020 Alcohol dependence, uncomplicated: Secondary | ICD-10-CM | POA: Diagnosis not present

## 2016-06-04 DIAGNOSIS — J029 Acute pharyngitis, unspecified: Secondary | ICD-10-CM | POA: Insufficient documentation

## 2016-06-04 DIAGNOSIS — E039 Hypothyroidism, unspecified: Secondary | ICD-10-CM | POA: Insufficient documentation

## 2016-06-04 DIAGNOSIS — I952 Hypotension due to drugs: Secondary | ICD-10-CM | POA: Diagnosis not present

## 2016-06-04 DIAGNOSIS — J069 Acute upper respiratory infection, unspecified: Secondary | ICD-10-CM

## 2016-06-04 DIAGNOSIS — Z87891 Personal history of nicotine dependence: Secondary | ICD-10-CM | POA: Diagnosis not present

## 2016-06-04 DIAGNOSIS — R569 Unspecified convulsions: Secondary | ICD-10-CM | POA: Insufficient documentation

## 2016-06-04 DIAGNOSIS — C140 Malignant neoplasm of pharynx, unspecified: Secondary | ICD-10-CM | POA: Insufficient documentation

## 2016-06-04 DIAGNOSIS — E440 Moderate protein-calorie malnutrition: Secondary | ICD-10-CM | POA: Insufficient documentation

## 2016-06-04 DIAGNOSIS — F419 Anxiety disorder, unspecified: Secondary | ICD-10-CM | POA: Diagnosis not present

## 2016-06-04 DIAGNOSIS — D61810 Antineoplastic chemotherapy induced pancytopenia: Secondary | ICD-10-CM | POA: Diagnosis not present

## 2016-06-04 DIAGNOSIS — K219 Gastro-esophageal reflux disease without esophagitis: Secondary | ICD-10-CM | POA: Insufficient documentation

## 2016-06-04 DIAGNOSIS — J449 Chronic obstructive pulmonary disease, unspecified: Secondary | ICD-10-CM | POA: Insufficient documentation

## 2016-06-04 LAB — POCT RAPID STREP A: Streptococcus, Group A Screen (Direct): NEGATIVE

## 2016-06-04 MED ORDER — MAGIC MOUTHWASH W/LIDOCAINE: 5.0000 mL | Freq: Four times a day (QID) | ORAL | 0 refills | Status: AC | PRN

## 2016-06-04 MED ORDER — FLUOXETINE HCL 40 MG PO CAPS: 40.0000 mg | ORAL_CAPSULE | Freq: Every day | ORAL | 1 refills | Status: AC

## 2016-06-04 NOTE — ED Provider Notes (Signed)
[facility]    CSN: [id number] Arrival date & time: 06/04/16  1137     History   Chief Complaint No chief complaint on file.   HPI Marvin [name] is a 40 y.o. male.   The history is provided by the patient.  Sore Throat  This is a recurrent problem. The current episode started 2 days ago. The problem has not changed since onset.The symptoms are aggravated by swallowing.    Past Medical History:  Diagnosis Date  . Acquired hypothyroidism 05/29/2016  . Alcohol dependence ([place]) 04/17/2012  . 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 Anxiety   . Anxiety disorder 03/12/2015  . Cancer ([place])   . COPD (chronic obstructive pulmonary disease) ([place])   . ETOH abuse   . Fatigue 11/23/2015  . GERD (gastroesophageal reflux disease)   . Headache 05/10/2015  . Hypokalemia 05/10/2015  . Seizures ([place])    one episode 09/2012, suspected related to ETOH withdrawal  . Severe major depression, single episode, without psychotic features ([place]) 09/13/2015  . Shortness of breath dyspnea    occ  . 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 Acquired hypothyroidism 05/29/2016  . Elevated liver enzymes 01/03/2016  . Fatigue 11/23/2015  . Pancytopenia due to antineoplastic chemotherapy ([place]) 11/05/2015  . Oral mucositis due to antineoplastic therapy 10/29/2015  . Chemotherapy induced nausea and vomiting 10/29/2015  . Chest wall pain 10/15/2015  . Anemia of chronic illness 10/15/2015  . Lactic acidosis   . Hypokalemia 10/01/2015  . Palliative care encounter 09/21/2015  . Major depressive disorder, recurrent episode, moderate ([place]) 09/17/2015  . Cancer associated pain 09/15/2015  . Alcohol use disorder, severe, dependence ([place]) 09/13/2015  . Pancytopenia, acquired ([place]) 09/11/2015  . Cancer of supraglottis ([place])   . Malignant neoplasm metastatic to lung ([place])   . Hypotension due to drugs 09/06/2015  . Hypotension, postural 04/10/2015  .  Orthostatic hypotension 03/28/2015  . GERD (gastroesophageal reflux disease) 03/12/2015  . Anxiety disorder 03/12/2015  . Tobacco abuse 03/12/2015  . 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 Protein-calorie malnutrition, moderate ([place]) 02/27/2015  . Malnutrition ([place])   . 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[place]) 02/22/2015    Past Surgical History:  Procedure Laterality Date  . APPENDECTOMY    . DIRECT LARYNGOSCOPY N/A 02/22/2015   Procedure: DIRECT LARYNGOSCOPY;  Surgeon: [name], MD;  Location: [facility];  Service: ENT;  Laterality: N/A;  direct laryngoscopy with biopsy  . KNEE SURGERY Left    kneecap -screws  . MULTIPLE EXTRACTIONS WITH ALVEOLOPLASTY N/A 03/05/2015   Procedure: Extraction of tooth #'s 1,2,8,9,15,16,17,18,31,32 with alveoloplasty, mandibular right lateral exostoses, and gross debridement of remaining teeth;  Surgeon: Lenn Cal, DDS;  Location: [facility];  Service: Oral Surgery;  Laterality: N/A;  . TRACHEOSTOMY TUBE PLACEMENT N/A 02/22/2015   Procedure: TRACHEOSTOMY;  Surgeon: [name], MD;  Location: [facility];  Service: ENT;  Laterality: N/A;  awake tracheostomy       Home Medications    Prior to Admission medications   Medication Sig Start Date End Date Taking? Authorizing Provider  dexamethasone (DECADRON) 4 MG tablet Take 2 tablets (8 mg total) by mouth 2 (two) times daily. Take 2 tablets twice daily after chemo for 2 days. 03/26/16   [name], MD  fludrocortisone (FLORINEF) 0.1 MG tablet Take 2 tablets (0.2 mg total) by mouth daily. 11/05/15   [name], MD  FLUoxetine (PROZAC) 40 MG capsule Take 40  mg by mouth daily.    Historical Provider, MD  ipratropium (ATROVENT) 0.06 % nasal spray Place 2 sprays into both nostrils 4 (four) times daily. 05/16/16   Billy Fischer, MD  levothyroxine (SYNTHROID) 50 MCG tablet Take 1 tablet (50 mcg total) by mouth daily before breakfast. 02/13/16   [name], MD  morphine (MS CONTIN) 60 MG 12 hr tablet Take 1 tablet (60 mg total) by mouth  every 12 (twelve) hours. 05/07/16   [name], MD  morphine (MSIR) 30 MG tablet Take 1 tablet (30 mg total) by mouth every 6 (six) hours as needed for severe pain. 05/29/16   [name], MD  ondansetron (ZOFRAN) 8 MG tablet Take 1 tablet (8 mg total) by mouth every 8 (eight) hours as needed for nausea. 01/02/16   [name], MD  potassium chloride SA (K-DUR,KLOR-CON) 20 MEQ tablet TAKE 1 TABLET BY MOUTH TWICE DAILY 04/01/16   [name], MD  prochlorperazine (COMPAZINE) 10 MG tablet Take 1 tablet (10 mg total) by mouth every 6 (six) hours as needed. 01/02/16   [name], MD  senna (SENOKOT) 8.6 MG TABS tablet Take 1 tablet (8.6 mg total) by mouth 2 (two) times daily. Patient taking differently: Take 1 tablet by mouth 2 (two) times daily as needed for mild constipation (when taking pain medication.).  10/15/15   [name], MD  traZODone (DESYREL) 100 MG tablet Take 1 tablet (100 mg total) by mouth at bedtime as needed for sleep. 01/23/16   [name], MD  triamcinolone cream (KENALOG) 0.1 % Apply topically 2 (two) times daily. 12/24/15   Daleen Bo, MD    Family History Family History  Problem Relation Age of Onset  . Cancer Mother   . Heart attack Maternal Grandmother     age of death 65  . Heart attack Maternal Grandfather   . Cancer Paternal Grandfather     Social History Social History  Substance Use Topics  . Smoking status: Former Smoker    Packs/day: 1.00    Years: 28.00    Types: Cigarettes    Quit date: 01/17/2015  . Smokeless tobacco: Never Used  . Alcohol use No     Comment: 6 -40-oz. beers daily     Allergies   Cetuximab   Review of Systems Review of Systems  Constitutional: Positive for fever. Negative for chills.  HENT: Positive for congestion, postnasal drip, rhinorrhea and sore throat.   Respiratory: Negative.   Cardiovascular: Negative.   All other systems reviewed and are negative.    Physical Exam Triage Vital Signs ED Triage Vitals [06/04/16 1209]    Enc Vitals Group     BP (!) 89/46     Pulse Rate 111     Resp 16     Temp 100.1 F (37.8 C)     Temp Source Oral     SpO2 97 %     Weight      Height      Head Circumference      Peak Flow      Pain Score      Pain Loc      Pain Edu?      Excl. in [place]?    No data found.   Updated Vital Signs BP (!) 89/46 (BP Location: Right Arm)   Pulse 111   Temp 100.1 F (37.8 C) (Oral)   Resp 16   SpO2 97%   Visual Acuity Right Eye Distance:   Left Eye  Distance:   Bilateral Distance:    Right Eye Near:   Left Eye Near:    Bilateral Near:     Physical Exam  Constitutional: He is oriented to person, place, and time. He appears well-developed and well-nourished. No distress.  HENT:  Right Ear: External ear normal.  Left Ear: External ear normal.  Nose: Mucosal edema and rhinorrhea present.  Mouth/Throat: Oropharynx is clear and moist.  Eyes: EOM are normal. Pupils are equal, round, and reactive to light.  Neck: Normal range of motion. Neck supple.  Lymphadenopathy:    He has no cervical adenopathy.  Neurological: He is alert and oriented to person, place, and time.  Skin: Skin is warm and dry.  Nursing note and vitals reviewed.    UC Treatments / Results  Labs (all labs ordered are listed, but only abnormal results are displayed) Labs Reviewed  POCT RAPID STREP A    EKG  EKG Interpretation None       Radiology No results found.  Procedures Procedures (including critical care time)  Medications Ordered in UC Medications - No data to display   Initial Impression / Assessment and Plan / UC Course  I have reviewed the triage vital signs and the nursing notes.  Pertinent labs & imaging results that were available during my care of the patient were reviewed by me and considered in my medical decision making (see chart for details).  Clinical Course     Final Clinical Impressions(s) / UC Diagnoses   Final diagnoses:  None    New Prescriptions New  Prescriptions   No medications on file     Billy Fischer, MD 06/04/16 1223

## 2016-06-07 LAB — CULTURE, GROUP A STREP (THRC)

## 2016-06-08 IMAGING — PT NM PET TUM IMG INITIAL (PI) SKULL BASE T - THIGH
1 of 8 series · 1 of 25 positions shown · non-contrast
Comparison: Abdominal CT of 02/27/2015. Chest radiograph
03/06/2015. Neck CT 01/22/2015.

CLINICAL DATA: Initial treatment strategy for staging of
supraglottic carcinoma. Ethanol abuse. COPD. Alcohol dependence.
Gastroesophageal reflux disease..

EXAM:
NUCLEAR MEDICINE PET SKULL BASE TO THIGH
TECHNIQUE: 7.0 mCi F-18 FDG was injected intravenously. Full-ring PET imaging
was performed from the skull base to thigh after the radiotracer. CT
data was obtained and used for attenuation correction and anatomic
localization.
FASTING BLOOD GLUCOSE:  Value: 109 mg/dl

[Series 3: pet [id number]_sk_[id number] ac · axial · 5.0mm · 4.07mm/px · 1 of 239 slices shown]
[im 159/239]
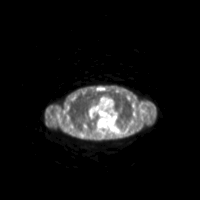

[1 of 25 positions shown; findings below may reference images not displayed]

FINDINGS: NECK

Hypermetabolic mass centered at the right side of the vallecula and
piriform sinuses. This measures a S.U.V. max of 13.7 on image 34 of
series 4.

Right level 2 node measures 11 mm and a S.U.V. max of 11.6 on image
29.

No left-sided hypermetabolic cervical nodes.

CHEST

Hypermetabolism along the tract of the tracheostomy which is likely
reactive. This measures a S.U.V. max of 8.3.

Mediastinal and right hilar nodal hypermetabolism. A right
paratracheal node measures 6 mm and a S.U.V. max of 3.8 on image 62.

Subcarinal node measures 11 mm and a S.U.V. max of 5.3 on image 76.

Left worse than right lower lobe patchy airspace disease with left
lower lobe dense consolidation. This is hypermetabolic. No
suspicious pulmonary nodule or mass.

ABDOMEN/PELVIS

Hypermetabolism along the gastrostomy tube tract which is likely
reactive. No abdominal pelvic nodal hypermetabolism.

SKELETON

No abnormal marrow activity.

CT IMAGES PERFORMED FOR ATTENUATION CORRECTION

Mucosal thickening and fluid in the right maxillary sinus. Normal
heart size, without pericardial effusion. Moderate centrilobular
emphysema. Minimal extraluminal air within the anterior abdomen may
be secondary to gastrostomy tube. Example image 131.
IMPRESSION: 1. Right piriform sinus mass with right level 2 nodal metastasis.
2. Worsened left greater than right lower lobe airspace disease,
highly suspicious for aspiration or pneumonia.
3. Hypermetabolic thoracic nodes which are at least partially felt
to be reactive. Nodal metastasis cannot be excluded and follow-up
imaging is warranted after appropriate antibiotic therapy.
4. No evidence of subdiaphragmatic metastatic disease.
5. Interval placement of a gastrostomy tube since 02/27/2015.
Minimal extraluminal air within the upper abdomen is favored to be
secondary.

## 2016-06-19 ENCOUNTER — Ambulatory Visit (HOSPITAL_BASED_OUTPATIENT_CLINIC_OR_DEPARTMENT_OTHER): Payer: Medicaid Other

## 2016-06-19 ENCOUNTER — Other Ambulatory Visit: Payer: Self-pay | Admitting: Hematology and Oncology

## 2016-06-19 ENCOUNTER — Other Ambulatory Visit (HOSPITAL_BASED_OUTPATIENT_CLINIC_OR_DEPARTMENT_OTHER): Payer: Medicaid Other

## 2016-06-19 ENCOUNTER — Ambulatory Visit (HOSPITAL_BASED_OUTPATIENT_CLINIC_OR_DEPARTMENT_OTHER): Payer: Medicaid Other | Admitting: Hematology and Oncology

## 2016-06-19 ENCOUNTER — Ambulatory Visit: Payer: Medicaid Other

## 2016-06-19 ENCOUNTER — Telehealth: Payer: Self-pay | Admitting: *Deleted

## 2016-06-19 ENCOUNTER — Encounter: Payer: Self-pay | Admitting: Hematology and Oncology

## 2016-06-19 ENCOUNTER — Telehealth: Payer: Self-pay | Admitting: Hematology and Oncology

## 2016-06-19 DIAGNOSIS — I951 Orthostatic hypotension: Secondary | ICD-10-CM

## 2016-06-19 DIAGNOSIS — D6481 Anemia due to antineoplastic chemotherapy: Secondary | ICD-10-CM

## 2016-06-19 DIAGNOSIS — C321 Malignant neoplasm of supraglottis: Secondary | ICD-10-CM | POA: Diagnosis present

## 2016-06-19 DIAGNOSIS — G893 Neoplasm related pain (acute) (chronic): Secondary | ICD-10-CM | POA: Diagnosis not present

## 2016-06-19 DIAGNOSIS — E440 Moderate protein-calorie malnutrition: Secondary | ICD-10-CM

## 2016-06-19 DIAGNOSIS — F331 Major depressive disorder, recurrent, moderate: Secondary | ICD-10-CM | POA: Diagnosis not present

## 2016-06-19 DIAGNOSIS — K1231 Oral mucositis (ulcerative) due to antineoplastic therapy: Secondary | ICD-10-CM

## 2016-06-19 DIAGNOSIS — C7800 Secondary malignant neoplasm of unspecified lung: Secondary | ICD-10-CM

## 2016-06-19 DIAGNOSIS — Z5189 Encounter for other specified aftercare: Secondary | ICD-10-CM | POA: Diagnosis not present

## 2016-06-19 DIAGNOSIS — Z5111 Encounter for antineoplastic chemotherapy: Secondary | ICD-10-CM | POA: Diagnosis not present

## 2016-06-19 DIAGNOSIS — D638 Anemia in other chronic diseases classified elsewhere: Secondary | ICD-10-CM

## 2016-06-19 DIAGNOSIS — E43 Unspecified severe protein-calorie malnutrition: Secondary | ICD-10-CM

## 2016-06-19 LAB — COMPREHENSIVE METABOLIC PANEL
ALBUMIN: 2.3 g/dL — AB (ref 3.5–5.0)
ALK PHOS: 96 U/L (ref 40–150)
ALT: 12 U/L (ref 0–55)
ANION GAP: 7 meq/L (ref 3–11)
AST: 18 U/L (ref 5–34)
BILIRUBIN TOTAL: 0.58 mg/dL (ref 0.20–1.20)
BUN: 7.8 mg/dL (ref 7.0–26.0)
CALCIUM: 8.6 mg/dL (ref 8.4–10.4)
CO2: 32 meq/L — AB (ref 22–29)
CREATININE: 0.5 mg/dL — AB (ref 0.7–1.3)
Chloride: 97 mEq/L — ABNORMAL LOW (ref 98–109)
Glucose: 92 mg/dl (ref 70–140)
Potassium: 3.2 mEq/L — ABNORMAL LOW (ref 3.5–5.1)
Sodium: 136 mEq/L (ref 136–145)
TOTAL PROTEIN: 5.5 g/dL — AB (ref 6.4–8.3)

## 2016-06-19 LAB — CBC WITH DIFFERENTIAL/PLATELET
BASO%: 0.6 % (ref 0.0–2.0)
Basophils Absolute: 0 10*3/uL (ref 0.0–0.1)
EOS ABS: 0.1 10*3/uL (ref 0.0–0.5)
EOS%: 0.8 % (ref 0.0–7.0)
HEMATOCRIT: 25.6 % — AB (ref 38.4–49.9)
HEMOGLOBIN: 8.5 g/dL — AB (ref 13.0–17.1)
LYMPH#: 0.5 10*3/uL — AB (ref 0.9–3.3)
LYMPH%: 7 % — ABNORMAL LOW (ref 14.0–49.0)
MCH: 30.2 pg (ref 27.2–33.4)
MCHC: 33.1 g/dL (ref 32.0–36.0)
MCV: 91.3 fL (ref 79.3–98.0)
MONO#: 0.9 10*3/uL (ref 0.1–0.9)
MONO%: 12 % (ref 0.0–14.0)
NEUT%: 79.6 % — ABNORMAL HIGH (ref 39.0–75.0)
NEUTROS ABS: 6.3 10*3/uL (ref 1.5–6.5)
PLATELETS: 394 10*3/uL (ref 140–400)
RBC: 2.8 10*6/uL — ABNORMAL LOW (ref 4.20–5.82)
RDW: 16.2 % — ABNORMAL HIGH (ref 11.0–14.6)
WBC: 7.9 10*3/uL (ref 4.0–10.3)

## 2016-06-19 MED ORDER — SODIUM CHLORIDE 0.9 % IJ SOLN
10.0000 mL | INTRAMUSCULAR | Status: AC | PRN
  Administered 2016-06-19: 10 mL
  Filled 2016-06-19: qty 10

## 2016-06-19 MED ORDER — DEXAMETHASONE SODIUM PHOSPHATE 10 MG/ML IJ SOLN
INTRAMUSCULAR | Status: AC
  Filled 2016-06-19: qty 1

## 2016-06-19 MED ORDER — HEPARIN SOD (PORK) LOCK FLUSH 100 UNIT/ML IV SOLN
500.0000 [IU] | Freq: Once | INTRAVENOUS | Status: AC | PRN
  Administered 2016-06-19: 500 [IU]
  Filled 2016-06-19: qty 5

## 2016-06-19 MED ORDER — SODIUM CHLORIDE 0.9% FLUSH
10.0000 mL | INTRAVENOUS | Status: DC | PRN
  Administered 2016-06-19: 10 mL
  Filled 2016-06-19: qty 10

## 2016-06-19 MED ORDER — DOCETAXEL CHEMO INJECTION 160 MG/16ML
75.0000 mg/m2 | Freq: Once | INTRAVENOUS | Status: AC
  Administered 2016-06-19: 130 mg via INTRAVENOUS
  Filled 2016-06-19: qty 13

## 2016-06-19 MED ORDER — DEXAMETHASONE SODIUM PHOSPHATE 10 MG/ML IJ SOLN
10.0000 mg | Freq: Once | INTRAMUSCULAR | Status: AC
  Administered 2016-06-19: 10 mg via INTRAVENOUS

## 2016-06-19 MED ORDER — PEGFILGRASTIM 6 MG/0.6ML ~~LOC~~ PSKT
6.0000 mg | PREFILLED_SYRINGE | Freq: Once | SUBCUTANEOUS | Status: AC
  Administered 2016-06-19: 6 mg via SUBCUTANEOUS
  Filled 2016-06-19: qty 0.6

## 2016-06-19 MED ORDER — SODIUM CHLORIDE 0.9 % IV SOLN
Freq: Once | INTRAVENOUS | Status: AC
  Administered 2016-06-19: 13:00:00 via INTRAVENOUS

## 2016-06-19 MED ORDER — MORPHINE SULFATE 30 MG PO TABS: 30.0000 mg | ORAL_TABLET | Freq: Four times a day (QID) | ORAL | 0 refills | Status: DC | PRN

## 2016-06-19 NOTE — Assessment & Plan Note (Signed)
The patient has mild decline in performance status. He understood the goals of care. We will proceed with cycle 3 of Taxotere today. I will see him back at the end of the month and will order repeat Ct scan of chest to assess response to Rx

## 2016-06-19 NOTE — Assessment & Plan Note (Signed)
He felt that his depression is better controlled with recent changes of his antidepressants and he is not suicidal. He will continue medication as prescribed by recent psychiatrist I reinforced the importance of him getting a consistent follow-up appointment for medication adjustment

## 2016-06-19 NOTE — Telephone Encounter (Signed)
per LOS I have scheduled appts and notified the scheduler

## 2016-06-19 NOTE — Assessment & Plan Note (Signed)
He is not symptomatic from lung metastasis. Observe only.

## 2016-06-19 NOTE — Assessment & Plan Note (Signed)
He has better control of chest wall pain I refilled his prescription of breakthrough pain medicine He saw ENT recently for evaluation of sore throat which showed no evidence of disease. Continue conservative management

## 2016-06-19 NOTE — Assessment & Plan Note (Signed)
He has significant low blood pressure despite Florinef with poor appetite. I recommend low-dose dexamethasone

## 2016-06-19 NOTE — Progress Notes (Signed)
[place] OFFICE PROGRESS NOTE  Patient Care Team: No Pcp Per Patient as PCP - General (General Practice) [name], MD as Consulting Physician (Otolaryngology) [name], MD as Attending Physician (Radiation Oncology) [name], MD as Consulting Physician (Hematology and Oncology) Leota Sauers, RN as Oncology Nurse Navigator Lenn Cal, DDS as Consulting Physician (Dentistry)  SUMMARY OF ONCOLOGIC HISTORY: Oncology History   Carcinoma of supraglottis   Staging form: Larynx - Supraglottis, AJCC 7th Edition     Clinical stage from 03/13/2015: Stage IVA (T3, N2b, M0) - Signed by [name], MD on 03/13/2015     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[place])   01/22/2015 Imaging    CT scan showed enhancing infiltrative mass centered at the right piriform sinus with bilateral neck lymphadenopathy      02/22/2015 - 03/08/2015 Hospital Admission    He was admitted to the hospital for management of advanced supraglottic cancer status post tracheostomy, biopsy, dental extraction, placement of feeding tube and subsequent discharge to skilled nursing facility      02/22/2015 Pathology Results    Accession: [id number] biopsy of supraglottic region 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      03/09/2015 PET scan    1)  Right piriform sinus mass with right level 2 nodal metastasis.  2)  Hypermetabolic thoracic nodes which are at least partially felt to be reactive.  3)  No evidence of subdiaphragmatic metastatic disease.       03/16/2015 Procedure    He has placement of port      03/21/2015 - 05/04/2015 Chemotherapy    He received high dose cisplatin x 3 cycles      03/21/2015 - 05/09/2015 Radiation Therapy    Tomotherapy to supraglottic tumor and bilateral neck:  70 Gy in 35 fractions to gross disease, 63 Gy in 35 fractions to high risk nodal echelons, and 56 Gy in 35 fractions to  intermediate risk nodal echelons.      05/09/2015 Miscellaneous    He went to the ED with fall      06/20/2015 Procedure    Tracheostomy was removed      09/06/2015 - 09/08/2015 Hospital Admission     he was admitted to the hospital with alcohol intoxication, dehydration and pain. CT scan of the chest show bilateral metastatic disease to the lung      10/01/2015 Imaging     PET CT scan showed diffuse, multiple hypermetabolic activity in both lungs suspicious for metastatic cancer      10/01/2015 Adverse Reaction    he received cetuximab, complicated by anaphylactic reaction      10/01/2015 - 10/03/2015 Hospital Admission    He was admitted to the hospital, intubated for cardiac arrest      10/15/2015 - 11/23/2015 Chemotherapy    He began palliative chemo with carboplatin and 5FU      10/29/2015 Adverse Reaction    5FU dose reduce by 10% due to nausea and vomiting      11/23/2015 Imaging    Ct chest showed disease progression      01/02/2016 - 03/06/2016 Chemotherapy    He received Keytruda      03/25/2016 Imaging    Interval progression of disease. There is enlarging anterior mediastinal soft tissue mass. Enlargement of previous pulmonary nodules as well as development of multiple new pulmonary nodules. New mass along the undersurface  of the right hemidiaphragm       03/25/2016 Imaging    CT neck showed resolution of right-sided hypo pharyngeal mass. Resolution of right-sided cervical adenopathy. No new adenopathy. Soft tissue edema around the glottis compatible with radiation change.      05/07/2016 -  Chemotherapy    The patient had palliative chemo with taxotere. First dose was delayed due to patient not feeling well       INTERVAL HISTORY: Please see below for problem oriented charting. He returns today prior to cycle 3 of treatment. He tolerated treatment well apart from intermittent sore throat. He saw ENT recently with endoscopy evaluation which showed no evidence of  cancer His chest wall pain is well-controlled with current prescription pain medicine He has gained some weight since I started him on regular doses of dexamethasone He denies recent fever or chills. Denies peripheral neuropathy. He continues to have weakness and fatigue and sleep more. The patient denies any recent signs or symptoms of bleeding such as spontaneous epistaxis, hematuria or hematochezia.   REVIEW OF SYSTEMS:   Constitutional: Denies fevers, chills or abnormal weight loss Eyes: Denies blurriness of vision Respiratory: Denies cough, dyspnea or wheezes Cardiovascular: Denies palpitation, chest discomfort or lower extremity swelling Gastrointestinal:  Denies nausea, heartburn or change in bowel habits Skin: Denies abnormal skin rashes Lymphatics: Denies new lymphadenopathy or easy bruising Neurological:Denies numbness, tingling or new weaknesses Behavioral/Psych: Mood is stable, no new changes  All other systems were reviewed with the patient and are negative.  I have reviewed the past medical history, past surgical history, social history and family history with the patient and they are unchanged from previous note.  ALLERGIES:  is allergic to cetuximab.  MEDICATIONS:  Current Outpatient Prescriptions  Medication Sig Dispense Refill  . dexamethasone (DECADRON) 4 MG tablet Take 2 tablets (8 mg total) by mouth 2 (two) times daily. Take 2 tablets twice daily after chemo for 2 days. 30 tablet 1  . fludrocortisone (FLORINEF) 0.1 MG tablet Take 2 tablets (0.2 mg total) by mouth daily. 90 tablet 6  . FLUoxetine (PROZAC) 40 MG capsule Take 1 capsule (40 mg total) by mouth daily. 10 capsule 1  . ipratropium (ATROVENT) 0.06 % nasal spray Place 2 sprays into both nostrils 4 (four) times daily. 15 mL 1  . levothyroxine (SYNTHROID) 50 MCG tablet Take 1 tablet (50 mcg total) by mouth daily before breakfast. 30 tablet 3  . magic mouthwash w/lidocaine SOLN Take 5 mLs by mouth 4 (four)  times daily as needed for mouth pain. 180 mL 0  . morphine (MS CONTIN) 60 MG 12 hr tablet Take 1 tablet (60 mg total) by mouth every 12 (twelve) hours. 60 tablet 0  . morphine (MSIR) 30 MG tablet Take 1 tablet (30 mg total) by mouth every 6 (six) hours as needed for severe pain. 90 tablet 0  . ondansetron (ZOFRAN) 8 MG tablet Take 1 tablet (8 mg total) by mouth every 8 (eight) hours as needed for nausea. 90 tablet 3  . potassium chloride SA (K-DUR,KLOR-CON) 20 MEQ tablet TAKE 1 TABLET BY MOUTH TWICE DAILY 180 tablet 0  . prochlorperazine (COMPAZINE) 10 MG tablet Take 1 tablet (10 mg total) by mouth every 6 (six) hours as needed. 90 tablet 3  . senna (SENOKOT) 8.6 MG TABS tablet Take 1 tablet (8.6 mg total) by mouth 2 (two) times daily. (Patient taking differently: Take 1 tablet by mouth 2 (two) times daily as needed for mild constipation (when  taking pain medication.). ) 60 each 0  . traZODone (DESYREL) 100 MG tablet Take 1 tablet (100 mg total) by mouth at bedtime as needed for sleep. 30 tablet 3  . triamcinolone cream (KENALOG) 0.1 % Apply topically 2 (two) times daily. 30 g 0   No current facility-administered medications for this visit.    Facility-Administered Medications Ordered in Other Visits  Medication Dose Route Frequency Provider Last Rate Last Dose  . 0.9 %  sodium chloride infusion   Intravenous Once [name], MD      . heparin lock flush 100 unit/mL  500 Units Intracatheter PRN [name], MD   500 Units at 04/01/16 1242  . heparin lock flush 100 unit/mL  250 Units Intracatheter PRN Bettymae Yott, MD      . sodium chloride flush (NS) 0.9 % injection 10 mL  10 mL Intravenous PRN [name], MD   10 mL at 10/01/15 0956    PHYSICAL EXAMINATION: ECOG PERFORMANCE STATUS: 1 - Symptomatic but completely ambulatory  Vitals:   06/19/16 1119  BP: (!) 94/58  Pulse: 88  Resp: 18  Temp: 98.4 F (36.9 C)   Filed Weights   06/19/16 1119  Weight: 131 lb 9.6 oz (59.7 kg)     GENERAL:alert, no distress and comfortable. He looks thin and cachectic SKIN: Noted skin discoloration around his neck EYES: normal, Conjunctiva are pink and non-injected, sclera clear OROPHARYNX:no exudate, no erythema and lips, buccal mucosa, and tongue normal  NECK: supple, thyroid normal size, non-tender, without nodularity LYMPH:  no palpable lymphadenopathy in the cervical, axillary or inguinal LUNGS: clear to auscultation and percussion with normal breathing effort HEART: regular rate & rhythm and no murmurs and no lower extremity edema ABDOMEN:abdomen soft, non-tender and normal bowel sounds Musculoskeletal:no cyanosis of digits and no clubbing  NEURO: alert & oriented x 3 with fluent speech, no focal motor/sensory deficits  LABORATORY DATA:  I have reviewed the data as listed    Component Value Date/Time   NA 138 05/29/2016 0854   K 3.6 05/29/2016 0854   CL 105 12/24/2015 1716   CO2 28 05/29/2016 0854   GLUCOSE 91 05/29/2016 0854   BUN 10.4 05/29/2016 0854   CREATININE 0.7 05/29/2016 0854   CALCIUM 8.8 05/29/2016 0854   PROT 5.9 (L) 05/29/2016 0854   ALBUMIN 2.4 (L) 05/29/2016 0854   AST 17 05/29/2016 0854   ALT 8 05/29/2016 0854   ALKPHOS 91 05/29/2016 0854   BILITOT 0.45 05/29/2016 0854   GFRNONAA >60 12/24/2015 1716   GFRAA >60 12/24/2015 1716    No results found for: SPEP, UPEP  Lab Results  Component Value Date   WBC 7.9 06/19/2016   NEUTROABS 6.3 06/19/2016   HGB 8.5 (L) 06/19/2016   HCT 25.6 (L) 06/19/2016   MCV 91.3 06/19/2016   PLT 394 06/19/2016      Chemistry      Component Value Date/Time   NA 138 05/29/2016 0854   K 3.6 05/29/2016 0854   CL 105 12/24/2015 1716   CO2 28 05/29/2016 0854   BUN 10.4 05/29/2016 0854   CREATININE 0.7 05/29/2016 0854   GLU 82 07/19/2015      Component Value Date/Time   CALCIUM 8.8 05/29/2016 0854   ALKPHOS 91 05/29/2016 0854   AST 17 05/29/2016 0854   ALT 8 05/29/2016 0854   BILITOT 0.45 05/29/2016  0854      ASSESSMENT & PLAN:  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[place]) The patient has mild decline  in performance status. He understood the goals of care. We will proceed with cycle 3 of Taxotere today. I will see him back at the end of the month and will order repeat Ct scan of chest to assess response to Rx  Malignant neoplasm metastatic to lung [facility]) He is not symptomatic from lung metastasis. Observe only.  Anemia of chronic illness This is likely due to recent treatment. The patient denies recent history of bleeding such as epistaxis, hematuria or hematochezia. He is asymptomatic from the anemia. I will observe for now.  He does not require transfusion now. I will continue the chemotherapy at current dose without dosage adjustment.  If the anemia gets progressive worse in the future, I might have to delay his treatment or adjust the chemotherapy dose.  Cancer associated pain He has better control of chest wall pain I refilled his prescription of breakthrough pain medicine He saw ENT recently for evaluation of sore throat which showed no evidence of disease. Continue conservative management  Orthostatic hypotension He has significant low blood pressure despite Florinef with poor appetite. I recommend low-dose dexamethasone   Protein-calorie malnutrition, moderate ([place]) He has lost some weight likely due to cancer cachexia. Recommend increased oral intake as tolerated and will add dexamethasone He has skin some weight since I saw him and I recommend we continue to same.  Major depressive disorder, recurrent episode, moderate ([place])  He felt that his depression is better controlled with recent changes of his antidepressants and he is not suicidal. He will continue medication as prescribed by recent psychiatrist I reinforced the importance of him getting a consistent follow-up appointment for medication adjustment   Orders Placed This Encounter  Procedures  . Hold Tube, Blood Bank     Standing Status:   Future    Standing Expiration Date:   07/24/2017   All questions were answered. The patient knows to call the clinic with any problems, questions or concerns. No barriers to learning was detected. I spent 25 minutes counseling the patient face to face. The total time spent in the appointment was 40 minutes and more than 50% was on counseling and review of test results     [name], MD 06/19/2016 11:41 AM

## 2016-06-19 NOTE — Telephone Encounter (Signed)
Appointments scheduled per 11/2 LOS. Patient given AVS report and calendars with future scheduled appointments °

## 2016-06-19 NOTE — Assessment & Plan Note (Signed)
This is likely due to recent treatment. The patient denies recent history of bleeding such as epistaxis, hematuria or hematochezia. He is asymptomatic from the anemia. I will observe for now.  He does not require transfusion now. I will continue the chemotherapy at current dose without dosage adjustment.  If the anemia gets progressive worse in the future, I might have to delay his treatment or adjust the chemotherapy dose.  

## 2016-06-19 NOTE — Assessment & Plan Note (Signed)
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 Recommend increased oral intake as tolerated and will add dexamethasone He has skin some weight since I saw him and I recommend we continue to same.

## 2016-06-25 ENCOUNTER — Ambulatory Visit (HOSPITAL_COMMUNITY)
Admission: RE | Admit: 2016-06-25 | Discharge: 2016-06-25 | Disposition: A | Discharge status: Home / Self Care | Payer: Medicaid Other | Source: Ambulatory Visit | Attending: Hematology and Oncology | Admitting: Hematology and Oncology

## 2016-06-25 DIAGNOSIS — D638 Anemia in other chronic diseases classified elsewhere: Secondary | ICD-10-CM | POA: Insufficient documentation

## 2016-07-11 ENCOUNTER — Other Ambulatory Visit: Payer: Self-pay | Admitting: *Deleted

## 2016-07-11 ENCOUNTER — Encounter (HOSPITAL_COMMUNITY): Payer: Self-pay

## 2016-07-11 ENCOUNTER — Ambulatory Visit (HOSPITAL_COMMUNITY)
Admission: RE | Admit: 2016-07-11 | Discharge: 2016-07-11 | Disposition: A | Discharge status: Home / Self Care | Payer: Medicaid Other | Source: Ambulatory Visit | Attending: Hematology and Oncology | Admitting: Hematology and Oncology

## 2016-07-11 DIAGNOSIS — J439 Emphysema, unspecified: Secondary | ICD-10-CM | POA: Insufficient documentation

## 2016-07-11 DIAGNOSIS — I951 Orthostatic hypotension: Secondary | ICD-10-CM

## 2016-07-11 DIAGNOSIS — C771 Secondary and unspecified malignant neoplasm of intrathoracic lymph nodes: Secondary | ICD-10-CM | POA: Insufficient documentation

## 2016-07-11 DIAGNOSIS — C321 Malignant neoplasm of supraglottis: Secondary | ICD-10-CM

## 2016-07-11 DIAGNOSIS — C7800 Secondary malignant neoplasm of unspecified lung: Secondary | ICD-10-CM | POA: Insufficient documentation

## 2016-07-11 DIAGNOSIS — C787 Secondary malignant neoplasm of liver and intrahepatic bile duct: Secondary | ICD-10-CM | POA: Diagnosis not present

## 2016-07-11 DIAGNOSIS — J90 Pleural effusion, not elsewhere classified: Secondary | ICD-10-CM | POA: Insufficient documentation

## 2016-07-11 MED ORDER — IOPAMIDOL (ISOVUE-300) INJECTION 61%
INTRAVENOUS | Status: AC
  Administered 2016-07-11: 75 mL
  Filled 2016-07-11: qty 75

## 2016-07-11 MED ORDER — POTASSIUM CHLORIDE CRYS ER 20 MEQ PO TBCR: 20.0000 meq | EXTENDED_RELEASE_TABLET | Freq: Two times a day (BID) | ORAL | 0 refills | Status: DC

## 2016-07-11 MED ORDER — LEVOTHYROXINE SODIUM 50 MCG PO TABS: 50.0000 ug | ORAL_TABLET | Freq: Every day | ORAL | 3 refills | Status: DC

## 2016-07-11 MED ORDER — PROCHLORPERAZINE MALEATE 10 MG PO TABS: 10.0000 mg | ORAL_TABLET | Freq: Four times a day (QID) | ORAL | 3 refills | Status: AC | PRN

## 2016-07-11 MED ORDER — FLUDROCORTISONE ACETATE 0.1 MG PO TABS: 0.2000 mg | ORAL_TABLET | Freq: Every day | ORAL | 6 refills | Status: AC

## 2016-07-11 MED ORDER — PROCHLORPERAZINE MALEATE 10 MG PO TABS: 10.0000 mg | ORAL_TABLET | Freq: Four times a day (QID) | ORAL | 3 refills | Status: DC | PRN

## 2016-07-11 MED FILL — FLUDROCORTISONE 0.1 MG TAB: 0.1 | 45 days supply | Qty: 90 | Fill #0

## 2016-07-11 MED FILL — KLOR-CON M20 TABLET: 20 | 30 days supply | Qty: 60 | Fill #0

## 2016-07-11 MED FILL — LEVOTHYROXINE 50 MCG TABLET: 50 | 30 days supply | Qty: 30 | Fill #0

## 2016-07-11 MED FILL — PROCHLORPERAZINE 10 MG TAB: 10 | 23 days supply | Qty: 90 | Fill #0

## 2016-07-14 ENCOUNTER — Ambulatory Visit (HOSPITAL_BASED_OUTPATIENT_CLINIC_OR_DEPARTMENT_OTHER): Payer: Medicaid Other

## 2016-07-14 ENCOUNTER — Other Ambulatory Visit (HOSPITAL_BASED_OUTPATIENT_CLINIC_OR_DEPARTMENT_OTHER): Payer: Medicaid Other

## 2016-07-14 ENCOUNTER — Ambulatory Visit: Payer: Medicaid Other

## 2016-07-14 VITALS — BP 83/58 | HR 64 | Temp 97.9°F | Resp 16

## 2016-07-14 DIAGNOSIS — C7800 Secondary malignant neoplasm of unspecified lung: Secondary | ICD-10-CM

## 2016-07-14 DIAGNOSIS — C321 Malignant neoplasm of supraglottis: Secondary | ICD-10-CM | POA: Diagnosis not present

## 2016-07-14 DIAGNOSIS — E43 Unspecified severe protein-calorie malnutrition: Secondary | ICD-10-CM

## 2016-07-14 DIAGNOSIS — D638 Anemia in other chronic diseases classified elsewhere: Secondary | ICD-10-CM

## 2016-07-14 DIAGNOSIS — D6481 Anemia due to antineoplastic chemotherapy: Secondary | ICD-10-CM

## 2016-07-14 DIAGNOSIS — K1231 Oral mucositis (ulcerative) due to antineoplastic therapy: Secondary | ICD-10-CM

## 2016-07-14 LAB — CBC WITH DIFFERENTIAL/PLATELET
BASO%: 0.7 % (ref 0.0–2.0)
BASOS ABS: 0.1 10*3/uL (ref 0.0–0.1)
EOS ABS: 0.6 10*3/uL — AB (ref 0.0–0.5)
EOS%: 5.5 % (ref 0.0–7.0)
HCT: 23.5 % — ABNORMAL LOW (ref 38.4–49.9)
HEMOGLOBIN: 7.5 g/dL — AB (ref 13.0–17.1)
LYMPH#: 0.7 10*3/uL — AB (ref 0.9–3.3)
LYMPH%: 6.4 % — ABNORMAL LOW (ref 14.0–49.0)
MCH: 28.3 pg (ref 27.2–33.4)
MCHC: 32.1 g/dL (ref 32.0–36.0)
MCV: 88.4 fL (ref 79.3–98.0)
MONO#: 1.1 10*3/uL — ABNORMAL HIGH (ref 0.1–0.9)
MONO%: 10.3 % (ref 0.0–14.0)
NEUT#: 8.4 10*3/uL — ABNORMAL HIGH (ref 1.5–6.5)
NEUT%: 77.1 % — ABNORMAL HIGH (ref 39.0–75.0)
Platelets: 434 10*3/uL — ABNORMAL HIGH (ref 140–400)
RBC: 2.65 10*6/uL — ABNORMAL LOW (ref 4.20–5.82)
RDW: 16.7 % — AB (ref 11.0–14.6)
WBC: 11 10*3/uL — ABNORMAL HIGH (ref 4.0–10.3)

## 2016-07-14 LAB — COMPREHENSIVE METABOLIC PANEL
ALBUMIN: 2.2 g/dL — AB (ref 3.5–5.0)
ALK PHOS: 88 U/L (ref 40–150)
ALT: 8 U/L (ref 0–55)
ANION GAP: 7 meq/L (ref 3–11)
AST: 19 U/L (ref 5–34)
BUN: 9.6 mg/dL (ref 7.0–26.0)
CALCIUM: 9.4 mg/dL (ref 8.4–10.4)
CO2: 30 mEq/L — ABNORMAL HIGH (ref 22–29)
Chloride: 99 mEq/L (ref 98–109)
Creatinine: 0.6 mg/dL — ABNORMAL LOW (ref 0.7–1.3)
Glucose: 93 mg/dl (ref 70–140)
POTASSIUM: 3.9 meq/L (ref 3.5–5.1)
Sodium: 136 mEq/L (ref 136–145)
Total Bilirubin: 0.4 mg/dL (ref 0.20–1.20)
Total Protein: 5.5 g/dL — ABNORMAL LOW (ref 6.4–8.3)

## 2016-07-14 LAB — PREPARE RBC (CROSSMATCH)

## 2016-07-14 MED ORDER — DIPHENHYDRAMINE HCL 25 MG PO CAPS
ORAL_CAPSULE | ORAL | Status: AC
  Filled 2016-07-14: qty 1

## 2016-07-14 MED ORDER — SODIUM CHLORIDE 0.9 % IV SOLN
250.0000 mL | Freq: Once | INTRAVENOUS | Status: AC
  Administered 2016-07-14: 250 mL via INTRAVENOUS

## 2016-07-14 MED ORDER — ACETAMINOPHEN 325 MG PO TABS
650.0000 mg | ORAL_TABLET | Freq: Once | ORAL | Status: AC
  Administered 2016-07-14: 650 mg via ORAL

## 2016-07-14 MED ORDER — SODIUM CHLORIDE 0.9% FLUSH
10.0000 mL | INTRAVENOUS | Status: AC | PRN
  Administered 2016-07-14: 10 mL
  Filled 2016-07-14: qty 10

## 2016-07-14 MED ORDER — SODIUM CHLORIDE 0.9 % IJ SOLN
10.0000 mL | INTRAMUSCULAR | Status: AC | PRN
  Administered 2016-07-14: 10 mL
  Filled 2016-07-14: qty 10

## 2016-07-14 MED ORDER — HYDROMORPHONE HCL 1 MG/ML IJ SOLN
2.0000 mg | INTRAMUSCULAR | Status: DC | PRN
  Administered 2016-07-14: 2 mg via INTRAVENOUS
  Filled 2016-07-14: qty 2

## 2016-07-14 MED ORDER — DIPHENHYDRAMINE HCL 25 MG PO CAPS
25.0000 mg | ORAL_CAPSULE | Freq: Once | ORAL | Status: AC
  Administered 2016-07-14: 25 mg via ORAL

## 2016-07-14 MED ORDER — ACETAMINOPHEN 325 MG PO TABS
ORAL_TABLET | ORAL | Status: AC
  Filled 2016-07-14: qty 2

## 2016-07-14 MED ORDER — HEPARIN SOD (PORK) LOCK FLUSH 100 UNIT/ML IV SOLN
500.0000 [IU] | Freq: Every day | INTRAVENOUS | Status: AC | PRN
  Administered 2016-07-14: 500 [IU]
  Filled 2016-07-14: qty 5

## 2016-07-14 MED ORDER — HYDROMORPHONE HCL 4 MG/ML IJ SOLN
INTRAMUSCULAR | Status: AC
  Filled 2016-07-14: qty 1

## 2016-07-14 NOTE — Patient Instructions (Signed)
Blood Transfusion A blood transfusion is a procedure in which you are given blood through an IV tube. You may need this procedure because of:  Illness.  Surgery.  Injury.  The blood may come from someone else (a donor). You may also be able to donate blood for yourself (autologous blood donation). The blood given in a transfusion is made up of different types of cells. You may get:  Red blood cells. These carry oxygen to the cells in the body.  White blood cells. These help you fight infections.  Platelets. These help your blood to clot.  Plasma. This is the liquid part of your blood. It helps with fluid imbalances.  If you have a clotting disorder, you may also get other types of blood products. What happens before the procedure?  You will have a blood test to find out your blood type. The test also finds out what type of blood your body will accept and matches it to the donor type.  If you are going to have a planned surgery, you may be able to donate your own blood. This may be done in case you need a transfusion.  If you have had an allergic reaction to a transfusion in the past, you may be given medicine to help prevent a reaction. This medicine may be given to you by mouth or through an IV.  You will have your temperature, blood pressure, and pulse checked.  Follow instructions from your doctor about what you cannot eat or drink.  Ask your doctor about: ? Changing or stopping your regular medicines. This is important if you take diabetes medicines or blood thinners. ? Taking medicines such as aspirin and ibuprofen. These medicines can thin your blood. Do not take these medicines before your procedure if your doctor tells you not to. What happens during the procedure?  An IV tube will be put into one of your veins.  The bag of donated blood will be attached to your IV tube. Then, the blood will enter through your vein.  Your temperature, blood pressure, and pulse will be  checked regularly during the procedure. This is done to find early signs of a transfusion reaction.  If you have any signs or symptoms of a reaction, your transfusion will be stopped. You may also be given medicine.  When the transfusion is done, your IV tube will be taken out.  Pressure may be applied to the IV site for a few minutes.  A bandage (dressing) will be put on the IV site. The procedure may vary among doctors and hospitals. What happens after the procedure?  Your temperature, blood pressure, heart rate, breathing rate, and blood oxygen level will be checked often.  Your blood may be tested to see how you are responding to the transfusion.  You may be warmed with fluids or blankets. This is done to keep the temperature of your body normal. Summary  A blood transfusion is a procedure in which you are given blood through an IV tube.  The blood may come from someone else (a donor). You may also be able to donate blood for yourself.  If you have had an allergic reaction to a transfusion in the past, you may be given medicine to help prevent a reaction. This medicine may be given to you by mouth or through an IV tube.  Your temperature, blood pressure, heart rate, breathing rate, and blood oxygen level will be checked often.  Your blood may be tested to   see how you are responding to the transfusion. This information is not intended to replace advice given to you by your health care provider. Make sure you discuss any questions you have with your health care provider. Document Released: 10/31/2008 Document Revised: 03/28/2016 Document Reviewed: 03/28/2016 Elsevier Interactive Patient Education  2017 [employer]  

## 2016-07-15 ENCOUNTER — Ambulatory Visit (HOSPITAL_BASED_OUTPATIENT_CLINIC_OR_DEPARTMENT_OTHER): Payer: Medicaid Other

## 2016-07-15 ENCOUNTER — Telehealth: Payer: Self-pay | Admitting: Hematology and Oncology

## 2016-07-15 ENCOUNTER — Encounter: Payer: Self-pay | Admitting: Hematology and Oncology

## 2016-07-15 ENCOUNTER — Telehealth: Payer: Self-pay | Admitting: *Deleted

## 2016-07-15 ENCOUNTER — Ambulatory Visit (HOSPITAL_BASED_OUTPATIENT_CLINIC_OR_DEPARTMENT_OTHER): Payer: Medicaid Other | Admitting: Hematology and Oncology

## 2016-07-15 VITALS — BP 88/61 | HR 90 | Temp 98.2°F | Resp 14 | Ht 72.0 in | Wt 127.7 lb

## 2016-07-15 VITALS — BP 86/51 | HR 74 | Resp 17

## 2016-07-15 DIAGNOSIS — E43 Unspecified severe protein-calorie malnutrition: Secondary | ICD-10-CM

## 2016-07-15 DIAGNOSIS — R748 Abnormal levels of other serum enzymes: Secondary | ICD-10-CM

## 2016-07-15 DIAGNOSIS — D638 Anemia in other chronic diseases classified elsewhere: Secondary | ICD-10-CM | POA: Diagnosis not present

## 2016-07-15 DIAGNOSIS — G893 Neoplasm related pain (acute) (chronic): Secondary | ICD-10-CM | POA: Diagnosis not present

## 2016-07-15 DIAGNOSIS — I951 Orthostatic hypotension: Secondary | ICD-10-CM

## 2016-07-15 DIAGNOSIS — E440 Moderate protein-calorie malnutrition: Secondary | ICD-10-CM

## 2016-07-15 DIAGNOSIS — C7802 Secondary malignant neoplasm of left lung: Secondary | ICD-10-CM

## 2016-07-15 DIAGNOSIS — C321 Malignant neoplasm of supraglottis: Secondary | ICD-10-CM

## 2016-07-15 DIAGNOSIS — C7800 Secondary malignant neoplasm of unspecified lung: Secondary | ICD-10-CM

## 2016-07-15 DIAGNOSIS — K1231 Oral mucositis (ulcerative) due to antineoplastic therapy: Secondary | ICD-10-CM

## 2016-07-15 DIAGNOSIS — C7801 Secondary malignant neoplasm of right lung: Secondary | ICD-10-CM | POA: Diagnosis not present

## 2016-07-15 LAB — TYPE AND SCREEN
ABO/RH(D): O POS
ANTIBODY SCREEN: NEGATIVE
Unit division: 0

## 2016-07-15 MED ORDER — HYDROMORPHONE HCL 4 MG/ML IJ SOLN
2.0000 mg | INTRAMUSCULAR | Status: DC | PRN
  Administered 2016-07-15: 2 mg via INTRAVENOUS

## 2016-07-15 MED ORDER — SODIUM CHLORIDE 0.9 % IV SOLN
1000.0000 mL | Freq: Once | INTRAVENOUS | Status: AC
  Administered 2016-07-15: 1000 mL via INTRAVENOUS

## 2016-07-15 MED ORDER — HYDROMORPHONE HCL 1 MG/ML IJ SOLN
2.0000 mg | INTRAMUSCULAR | Status: DC | PRN
  Filled 2016-07-15: qty 2

## 2016-07-15 MED ORDER — SODIUM CHLORIDE 0.9 % IV SOLN: Freq: Once | INTRAVENOUS | Status: DC

## 2016-07-15 MED ORDER — ONDANSETRON HCL 4 MG/2ML IJ SOLN
INTRAMUSCULAR | Status: AC
  Filled 2016-07-15: qty 4

## 2016-07-15 MED ORDER — HYDROMORPHONE HCL 4 MG/ML IJ SOLN
INTRAMUSCULAR | Status: AC
  Filled 2016-07-15: qty 1

## 2016-07-15 MED ORDER — SODIUM CHLORIDE 0.9 % IJ SOLN
10.0000 mL | INTRAMUSCULAR | Status: DC | PRN
  Administered 2016-07-15: 10 mL
  Filled 2016-07-15: qty 10

## 2016-07-15 MED ORDER — HYDROMORPHONE HCL 4 MG/ML IJ SOLN
2.0000 mg | Freq: Once | INTRAMUSCULAR | Status: AC
  Administered 2016-07-15: 2 mg via INTRAVENOUS

## 2016-07-15 MED ORDER — HEPARIN SOD (PORK) LOCK FLUSH 100 UNIT/ML IV SOLN
250.0000 [IU] | Freq: Once | INTRAVENOUS | Status: DC | PRN
  Filled 2016-07-15: qty 5

## 2016-07-15 MED ORDER — PREDNISONE 10 MG PO TABS: 10.0000 mg | ORAL_TABLET | Freq: Every day | ORAL | 3 refills | Status: AC

## 2016-07-15 MED ORDER — ONDANSETRON HCL 8 MG PO TABS
ORAL_TABLET | ORAL | Status: AC
  Filled 2016-07-15: qty 1

## 2016-07-15 MED ORDER — ONDANSETRON HCL 4 MG/2ML IJ SOLN
8.0000 mg | Freq: Once | INTRAMUSCULAR | Status: AC
  Administered 2016-07-15: 8 mg via INTRAVENOUS

## 2016-07-15 MED ORDER — ALTEPLASE 2 MG IJ SOLR
2.0000 mg | Freq: Once | INTRAMUSCULAR | Status: DC | PRN
  Filled 2016-07-15: qty 2

## 2016-07-15 MED ORDER — HEPARIN SOD (PORK) LOCK FLUSH 100 UNIT/ML IV SOLN
500.0000 [IU] | Freq: Once | INTRAVENOUS | Status: AC | PRN
  Administered 2016-07-15: 500 [IU]
  Filled 2016-07-15: qty 5

## 2016-07-15 MED ORDER — MORPHINE SULFATE 30 MG PO TABS: 30.0000 mg | ORAL_TABLET | Freq: Four times a day (QID) | ORAL | 0 refills | Status: DC | PRN

## 2016-07-15 MED FILL — predniSONE 10 MG TABS: 10 | 30 days supply | Qty: 30 | Fill #0

## 2016-07-15 MED FILL — MORPHINE SULFATE IR 30 MG T: 30 | 22 days supply | Qty: 90 | Fill #0

## 2016-07-15 NOTE — Progress Notes (Signed)
1320: Pt left ambulatory in no apparent distress. Pt denied any dizziness or lightheadedness. Pt aware to continue to monitor BP and states he is on medicine for his BP. Pt escorted to lobby by volunteer. Pt had no questions upon discharge.

## 2016-07-15 NOTE — Assessment & Plan Note (Addendum)
Unfortunately, repeat imaging study showed diffuse widespread disease progression I reviewed the current guidelines and I do not recommend further therapy due to expected low response to treatment and poor baseline performance status I recommend we focus on palliative care and consider hospice. I also offered him a second opinion. For now, family members want to bring him back every 3 weeks for transfusion support and pain management. The patient and family members are devastated with the news. I will address goals of care in the next visit

## 2016-07-15 NOTE — Assessment & Plan Note (Signed)
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 Recommend increased oral intake as tolerated and will add daily steroids

## 2016-07-15 NOTE — Assessment & Plan Note (Signed)
This is likely due to side effects of chemotherapy and anemia of chronic illness I will see him again in 3 weeks with repeat blood work and transfusion support as needed

## 2016-07-15 NOTE — Assessment & Plan Note (Signed)
He has better control of chest wall pain I refilled his prescription of breakthrough pain medicine

## 2016-07-15 NOTE — Progress Notes (Signed)
[place] OFFICE PROGRESS NOTE  Patient Care Team: No Pcp Per Patient as PCP - General (General Practice) [name], MD as Consulting Physician (Otolaryngology) [name], MD as Attending Physician (Radiation Oncology) [name], MD as Consulting Physician (Hematology and Oncology) Leota Sauers, RN as Oncology Nurse Navigator Lenn Cal, DDS as Consulting Physician (Dentistry)  SUMMARY OF ONCOLOGIC HISTORY: Oncology History   Carcinoma of supraglottis   Staging form: Larynx - Supraglottis, AJCC 7th Edition     Clinical stage from 03/13/2015: Stage IVA (T3, N2b, M0) - Signed by [name], MD on 03/13/2015     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[place])   01/22/2015 Imaging    CT scan showed enhancing infiltrative mass centered at the right piriform sinus with bilateral neck lymphadenopathy      02/22/2015 - 03/08/2015 Hospital Admission    He was admitted to the hospital for management of advanced supraglottic cancer status post tracheostomy, biopsy, dental extraction, placement of feeding tube and subsequent discharge to skilled nursing facility      02/22/2015 Pathology Results    Accession: [id number] biopsy of supraglottic region 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      03/09/2015 PET scan    1)  Right piriform sinus mass with right level 2 nodal metastasis.  2)  Hypermetabolic thoracic nodes which are at least partially felt to be reactive.  3)  No evidence of subdiaphragmatic metastatic disease.       03/16/2015 Procedure    He has placement of port      03/21/2015 - 05/04/2015 Chemotherapy    He received high dose cisplatin x 3 cycles      03/21/2015 - 05/09/2015 Radiation Therapy    Tomotherapy to supraglottic tumor and bilateral neck:  70 Gy in 35 fractions to gross disease, 63 Gy in 35 fractions to high risk nodal echelons, and 56 Gy in 35 fractions to  intermediate risk nodal echelons.      05/09/2015 Miscellaneous    He went to the ED with fall      06/20/2015 Procedure    Tracheostomy was removed      09/06/2015 - 09/08/2015 Hospital Admission     he was admitted to the hospital with alcohol intoxication, dehydration and pain. CT scan of the chest show bilateral metastatic disease to the lung      10/01/2015 Imaging     PET CT scan showed diffuse, multiple hypermetabolic activity in both lungs suspicious for metastatic cancer      10/01/2015 Adverse Reaction    he received cetuximab, complicated by anaphylactic reaction      10/01/2015 - 10/03/2015 Hospital Admission    He was admitted to the hospital, intubated for cardiac arrest      10/15/2015 - 11/23/2015 Chemotherapy    He began palliative chemo with carboplatin and 5FU      10/29/2015 Adverse Reaction    5FU dose reduce by 10% due to nausea and vomiting      11/23/2015 Imaging    Ct chest showed disease progression      01/02/2016 - 03/06/2016 Chemotherapy    He received Keytruda      03/25/2016 Imaging    Interval progression of disease. There is enlarging anterior mediastinal soft tissue mass. Enlargement of previous pulmonary nodules as well as development of multiple new pulmonary nodules. New mass along the undersurface  of the right hemidiaphragm       03/25/2016 Imaging    CT neck showed resolution of right-sided hypo pharyngeal mass. Resolution of right-sided cervical adenopathy. No new adenopathy. Soft tissue edema around the glottis compatible with radiation change.      05/07/2016 - 06/19/2016 Chemotherapy    The patient had palliative chemo with taxotere. First dose was delayed due to patient not feeling well      07/11/2016 Imaging    CT chest showed innumerable pulmonary metastases are significantly increased in size and number since 03/25/2016. 2. New small right and trace left pleural effusions. 3. New right pericardiophrenic nodal metastases. Otherwise  stable previously described mediastinal and right hilar nodal metastases. 4. Large locally invasive metastasis centered at the anterior right hemidiaphragm, significantly increased. 5. New small liver metastasis. 6. Mild emphysema.       INTERVAL HISTORY: Please see below for problem oriented charting. He returns today with family members. He continues to have weight loss. His chest wall pain appears to be better control with regular prescription of morphine. He denies nausea or constipation.  REVIEW OF SYSTEMS:   Constitutional: Denies fevers, chills  Eyes: Denies blurriness of vision Ears, nose, mouth, throat, and face: Denies mucositis or sore throat Respiratory: Denies cough, dyspnea or wheezes Cardiovascular: Denies palpitation, chest discomfort or lower extremity swelling Gastrointestinal:  Denies nausea, heartburn or change in bowel habits Skin: Denies abnormal skin rashes Lymphatics: Denies new lymphadenopathy or easy bruising Neurological:Denies numbness, tingling or new weaknesses Behavioral/Psych: Mood is stable, no new changes  All other systems were reviewed with the patient and are negative.  I have reviewed the past medical history, past surgical history, social history and family history with the patient and they are unchanged from previous note.  ALLERGIES:  is allergic to cetuximab.  MEDICATIONS:  Current Outpatient Prescriptions  Medication Sig Dispense Refill  . fludrocortisone (FLORINEF) 0.1 MG tablet Take 2 tablets (0.2 mg total) by mouth daily. 90 tablet 6  . FLUoxetine (PROZAC) 40 MG capsule Take 1 capsule (40 mg total) by mouth daily. 10 capsule 1  . ipratropium (ATROVENT) 0.06 % nasal spray Place 2 sprays into both nostrils 4 (four) times daily. 15 mL 1  . levothyroxine (SYNTHROID) 50 MCG tablet Take 1 tablet (50 mcg total) by mouth daily before breakfast. 30 tablet 3  . magic mouthwash w/lidocaine SOLN Take 5 mLs by mouth 4 (four) times daily as needed  for mouth pain. 180 mL 0  . morphine (MSIR) 30 MG tablet Take 1 tablet (30 mg total) by mouth every 6 (six) hours as needed for severe pain. 90 tablet 0  . ondansetron (ZOFRAN) 8 MG tablet Take 1 tablet (8 mg total) by mouth every 8 (eight) hours as needed for nausea. 90 tablet 3  . potassium chloride SA (K-DUR,KLOR-CON) 20 MEQ tablet Take 1 tablet (20 mEq total) by mouth 2 (two) times daily. 180 tablet 0  . prochlorperazine (COMPAZINE) 10 MG tablet Take 1 tablet (10 mg total) by mouth every 6 (six) hours as needed. 90 tablet 3  . traZODone (DESYREL) 100 MG tablet Take 1 tablet (100 mg total) by mouth at bedtime as needed for sleep. 30 tablet 3  . triamcinolone cream (KENALOG) 0.1 % Apply topically 2 (two) times daily. 30 g 0  . predniSONE (DELTASONE) 10 MG tablet Take 1 tablet (10 mg total) by mouth daily with breakfast. 30 tablet 3  . senna (SENOKOT) 8.6 MG TABS tablet Take 1 tablet (8.6  mg total) by mouth 2 (two) times daily. (Patient taking differently: Take 1 tablet by mouth 2 (two) times daily as needed for mild constipation (when taking pain medication.). ) 60 each 0   No current facility-administered medications for this visit.    Facility-Administered Medications Ordered in Other Visits  Medication Dose Route Frequency Provider Last Rate Last Dose  . 0.9 %  sodium chloride infusion   Intravenous Once [name], MD      . heparin lock flush 100 unit/mL  500 Units Intracatheter PRN [name], MD   500 Units at 04/01/16 1242  . heparin lock flush 100 unit/mL  250 Units Intracatheter PRN [name], MD      . sodium chloride flush (NS) 0.9 % injection 10 mL  10 mL Intravenous PRN [name], MD   10 mL at 10/01/15 0956    PHYSICAL EXAMINATION: ECOG PERFORMANCE STATUS: 2 - Symptomatic, <50% confined to bed  Vitals:   07/15/16 1004  BP: (!) 88/61  Pulse: 90  Resp: 14  Temp: 98.2 F (36.8 C)   Filed Weights   07/15/16 1004  Weight: 127 lb 11.2 oz (57.9 kg)    GENERAL:alert, no  distress and comfortable. He looks thin and cachectic SKIN: Noted skin discoloration around his face and neck EYES: normal, Conjunctiva are pink and non-injected, sclera clear Musculoskeletal:no cyanosis of digits and no clubbing  NEURO: alert & oriented x 3 with fluent speech, no focal motor/sensory deficits  LABORATORY DATA:  I have reviewed the data as listed    Component Value Date/Time   NA 136 07/14/2016 0851   K 3.9 07/14/2016 0851   CL 105 12/24/2015 1716   CO2 30 (H) 07/14/2016 0851   GLUCOSE 93 07/14/2016 0851   BUN 9.6 07/14/2016 0851   CREATININE 0.6 (L) 07/14/2016 0851   CALCIUM 9.4 07/14/2016 0851   PROT 5.5 (L) 07/14/2016 0851   ALBUMIN 2.2 (L) 07/14/2016 0851   AST 19 07/14/2016 0851   ALT 8 07/14/2016 0851   ALKPHOS 88 07/14/2016 0851   BILITOT 0.40 07/14/2016 0851   GFRNONAA >60 12/24/2015 1716   GFRAA >60 12/24/2015 1716    No results found for: SPEP, UPEP  Lab Results  Component Value Date   WBC 11.0 (H) 07/14/2016   NEUTROABS 8.4 (H) 07/14/2016   HGB 7.5 (L) 07/14/2016   HCT 23.5 (L) 07/14/2016   MCV 88.4 07/14/2016   PLT 434 (H) 07/14/2016      Chemistry      Component Value Date/Time   NA 136 07/14/2016 0851   K 3.9 07/14/2016 0851   CL 105 12/24/2015 1716   CO2 30 (H) 07/14/2016 0851   BUN 9.6 07/14/2016 0851   CREATININE 0.6 (L) 07/14/2016 0851   GLU 82 07/19/2015      Component Value Date/Time   CALCIUM 9.4 07/14/2016 0851   ALKPHOS 88 07/14/2016 0851   AST 19 07/14/2016 0851   ALT 8 07/14/2016 0851   BILITOT 0.40 07/14/2016 0851       RADIOGRAPHIC STUDIES:I reviewed all the imaging study with the patient and family I have personally reviewed the radiological images as listed and agreed with the findings in the report.    ASSESSMENT & PLAN:  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[place]) Unfortunately, repeat imaging study showed diffuse widespread disease progression I reviewed the current guidelines and I do not recommend further  therapy due to expected low response to treatment and poor baseline performance status I recommend we focus  on palliative care and consider hospice. I also offered him a second opinion. For now, family members want to bring him back every 3 weeks for transfusion support and pain management. The patient and family members are devastated with the news. I will address goals of care in the next visit   Anemia of chronic illness This is likely due to side effects of chemotherapy and anemia of chronic illness I will see him again in 3 weeks with repeat blood work and transfusion support as needed  Cancer associated pain He has better control of chest wall pain I refilled his prescription of breakthrough pain medicine  Elevated liver enzymes This is likely related to liver metastasis. He is not symptomatic from that standpoint  Orthostatic hypotension He has significant low blood pressure despite Florinef with poor appetite. I recommend IV fluids today and daily prednisone therapy to help with fluid retention and appetite  Protein-calorie malnutrition, moderate (HCC) He has lost some weight likely due to cancer cachexia. Recommend increased oral intake as tolerated and will add daily steroids   Orders Placed This Encounter  Procedures  . CBC with Differential/Platelet    Standing Status:   Future    Standing Expiration Date:   08/19/2017  . Comprehensive metabolic panel    Standing Status:   Future    Standing Expiration Date:   08/19/2017  . Hold Tube, Blood Bank    Standing Status:   Standing    Number of Occurrences:   22    Standing Expiration Date:   07/15/2017   All questions were answered. The patient knows to call the clinic with any problems, questions or concerns. No barriers to learning was detected. I spent 30 minutes counseling the patient face to face. The total time spent in the appointment was 40 minutes and more than 50% was on counseling and review of test  results     [name], MD 07/15/2016 10:38 AM

## 2016-07-15 NOTE — Telephone Encounter (Signed)
Per LOS I have scheduled appts and notified the scheduler 

## 2016-07-15 NOTE — Assessment & Plan Note (Signed)
He has significant low blood pressure despite Florinef with poor appetite. I recommend IV fluids today and daily prednisone therapy to help with fluid retention and appetite

## 2016-07-15 NOTE — Telephone Encounter (Signed)
Messages sent to infusion scheduler to be added. Appointments scheduled per 07/15/16 los. A copy of the AVS report and appointment schedule was given to patient, per 07/15/16 los.

## 2016-07-15 NOTE — Assessment & Plan Note (Signed)
This is likely related to liver metastasis. He is not symptomatic from that standpoint

## 2016-07-15 NOTE — Patient Instructions (Signed)
Dehydration, Adult Dehydration is when there is not enough fluid or water in your body. This happens when you lose more fluids than you take in. Dehydration can range from mild to very bad. It should be treated right away to keep it from getting very bad. Symptoms of mild dehydration may include:   Thirst.  Dry lips.  Slightly dry mouth.  Dry, warm skin.  Dizziness. Symptoms of moderate dehydration may include:   Very dry mouth.  Muscle cramps.  Dark pee (urine). Pee may be the color of tea.  Your body making less pee.  Your eyes making fewer tears.  Heartbeat that is uneven or faster than normal (palpitations).  Headache.  Light-headedness, especially when you stand up from sitting.  Fainting (syncope). Symptoms of very bad dehydration may include:   Changes in skin, such as:  Cold and clammy skin.  Blotchy (mottled) or pale skin.  Skin that does not quickly return to normal after being lightly pinched and let go (poor skin turgor).  Changes in body fluids, such as:  Feeling very thirsty.  Your eyes making fewer tears.  Not sweating when body temperature is high, such as in hot weather.  Your body making very little pee.  Changes in vital signs, such as:  Weak pulse.  Pulse that is more than 100 beats a minute when you are sitting still.  Fast breathing.  Low blood pressure.  Other changes, such as:  Sunken eyes.  Cold hands and feet.  Confusion.  Lack of energy (lethargy).  Trouble waking up from sleep.  Short-term weight loss.  Unconsciousness. Follow these instructions at home:  If told by your doctor, drink an ORS:  Make an ORS by using instructions on the package.  Start by drinking small amounts, about  cup (120 mL) every 5-10 minutes.  Slowly drink more until you have had the amount that your doctor said to have.  Drink enough clear fluid to keep your pee clear or pale yellow. If you were told to drink an ORS, finish the  ORS first, then start slowly drinking clear fluids. Drink fluids such as:  Water. Do not drink only water by itself. Doing that can make the salt (sodium) level in your body get too low (hyponatremia).  Ice chips.  Fruit juice that you have added water to (diluted).  Low-calorie sports drinks.  Avoid:  Alcohol.  Drinks that have a lot of sugar. These include high-calorie sports drinks, fruit juice that does not have water added, and soda.  Caffeine.  Foods that are greasy or have a lot of fat or sugar.  Take over-the-counter and prescription medicines only as told by your doctor.  Do not take salt tablets. Doing that can make the salt level in your body get too high (hypernatremia).  Eat foods that have minerals (electrolytes). Examples include bananas, oranges, potatoes, tomatoes, and spinach.  Keep all follow-up visits as told by your doctor. This is important. Contact a doctor if:  You have belly (abdominal) pain that:  Gets worse.  Stays in one area (localizes).  You have a rash.  You have a stiff neck.  You get angry or annoyed more easily than normal (irritability).  You are more sleepy than normal.  You have a harder time waking up than normal.  You feel:  Weak.  Dizzy.  Very thirsty.  You have peed (urinated) only a small amount of very dark pee during 6-8 hours. Get help right away if:  You   have symptoms of very bad dehydration.  You cannot drink fluids without throwing up (vomiting).  Your symptoms get worse with treatment.  You have a fever.  You have a very bad headache.  You are throwing up or having watery poop (diarrhea) and it:  Gets worse.  Does not go away.  You have blood or something green (bile) in your throw-up.  You have blood in your poop (stool). This may cause poop to look black and tarry.  You have not peed in 6-8 hours.  You pass out (faint).  Your heart rate when you are sitting still is more than 100 beats a  minute.  You have trouble breathing. This information is not intended to replace advice given to you by your health care provider. Make sure you discuss any questions you have with your health care provider. Document Released: 05/31/2009 Document Revised: 02/22/2016 Document Reviewed: 09/28/2015 Elsevier Interactive Patient Education  2017 Elsevier Inc.  

## 2016-07-18 ENCOUNTER — Ambulatory Visit (HOSPITAL_COMMUNITY)
Admission: RE | Admit: 2016-07-18 | Discharge: 2016-07-18 | Disposition: A | Discharge status: Home / Self Care | Payer: Medicaid Other | Source: Ambulatory Visit | Attending: Hematology and Oncology | Admitting: Hematology and Oncology

## 2016-07-18 DIAGNOSIS — D630 Anemia in neoplastic disease: Secondary | ICD-10-CM | POA: Insufficient documentation

## 2016-07-24 ENCOUNTER — Encounter: Payer: Self-pay | Admitting: *Deleted

## 2016-07-24 ENCOUNTER — Telehealth: Payer: Self-pay | Admitting: *Deleted

## 2016-07-24 ENCOUNTER — Other Ambulatory Visit: Payer: Self-pay | Admitting: Hematology and Oncology

## 2016-07-24 DIAGNOSIS — C321 Malignant neoplasm of supraglottis: Secondary | ICD-10-CM

## 2016-07-24 DIAGNOSIS — C7800 Secondary malignant neoplasm of unspecified lung: Secondary | ICD-10-CM

## 2016-07-24 MED ORDER — MORPHINE SULFATE ER 60 MG PO TBCR: 60.0000 mg | EXTENDED_RELEASE_TABLET | Freq: Two times a day (BID) | ORAL | 0 refills | Status: DC

## 2016-07-24 MED ORDER — MORPHINE SULFATE 30 MG PO TABS: 60.0000 mg | ORAL_TABLET | ORAL | 0 refills | Status: AC | PRN

## 2016-07-24 MED FILL — MORPHINE SULFATE IR 30 MG T: 30 | 8 days supply | Qty: 90 | Fill #0

## 2016-07-24 MED FILL — MORPHINE SULF 60 MG TAB SA: 60 | 30 days supply | Qty: 60 | Fill #0

## 2016-07-24 NOTE — Telephone Encounter (Signed)
[facility] to make [facility] Referral.  Request they contact pt's Marvin [name] at phone [phone].  Faxed information to [facility] at [phone].

## 2016-07-24 NOTE — Telephone Encounter (Signed)
Oncology Nurse Navigator Documentation  Received call from Mr. Glorioso aunt, [name].  She reported Brant is no longer able to find pain relief with MSIR as currently prescribed.  When she spoke with Juanda Crumble this morning he indicated he took 60 mg last HS followed shortly by 30 mg with no relief.  He asked for an adjustment in Rx.  She requested hospice referral, stated Allison is in agreement. She understands I will call her re Rx and referral. Dr. Alvy Bimler notified.  [name], RN, BSN, [place] [facility] On[facility] Nurse [place] at [place] [phone]

## 2016-07-24 NOTE — Telephone Encounter (Signed)
Cameo, please send referral to [facility], I will start him on MS Contin 60 mg BID and refill IR morphine to 60 mg q 4 hours prn pain. Prescription ready to pick up

## 2016-07-24 NOTE — Telephone Encounter (Addendum)
Oncology Nurse [place]' aunt, Marvin [name], informed her of new Rx ready for pick-up.  She indicated she would be by this afternoon.  Informed her referral being made to [facility].  She requested [facility] contact her to arrange initial visit, RN Cameo notified.  [name], RN, BSN, [place] [facility] Oncology Nurse [place] at [place] [phone]

## 2016-07-31 ENCOUNTER — Telehealth: Payer: Self-pay | Admitting: *Deleted

## 2016-07-31 NOTE — Telephone Encounter (Signed)
Oncology Nurse Navigator Documentation  Called Marvin [name]' [name] to check on his well-being. She reported:  He is "maintaining, doing as well as can be".  He has enrolled with [facility], is being seen twice weekly by nursing.  She confirmed he wishes to keep next Monday's appts, and in particular wants to receive blood transfusion as it "perks him up".  Marvin [name] indicated [name] will pay for Monday's tfx as well as 2 subsequent tmts. I provided Dr. Alvy Bimler update.  [name], RN, BSN, [place] [facility] Oncology Nurse [place] at [place] [phone]

## 2016-08-04 ENCOUNTER — Ambulatory Visit (HOSPITAL_BASED_OUTPATIENT_CLINIC_OR_DEPARTMENT_OTHER): Payer: Medicaid Other

## 2016-08-04 ENCOUNTER — Ambulatory Visit: Payer: Medicaid Other

## 2016-08-04 ENCOUNTER — Ambulatory Visit (HOSPITAL_BASED_OUTPATIENT_CLINIC_OR_DEPARTMENT_OTHER): Payer: Medicaid Other | Admitting: Hematology and Oncology

## 2016-08-04 ENCOUNTER — Other Ambulatory Visit: Payer: Self-pay | Admitting: Hematology and Oncology

## 2016-08-04 ENCOUNTER — Encounter: Payer: Self-pay | Admitting: Hematology and Oncology

## 2016-08-04 ENCOUNTER — Telehealth: Payer: Self-pay | Admitting: Hematology and Oncology

## 2016-08-04 ENCOUNTER — Other Ambulatory Visit: Payer: Self-pay | Admitting: *Deleted

## 2016-08-04 ENCOUNTER — Other Ambulatory Visit (HOSPITAL_BASED_OUTPATIENT_CLINIC_OR_DEPARTMENT_OTHER): Payer: Medicaid Other

## 2016-08-04 DIAGNOSIS — E876 Hypokalemia: Secondary | ICD-10-CM | POA: Diagnosis not present

## 2016-08-04 DIAGNOSIS — D630 Anemia in neoplastic disease: Secondary | ICD-10-CM | POA: Insufficient documentation

## 2016-08-04 DIAGNOSIS — C321 Malignant neoplasm of supraglottis: Secondary | ICD-10-CM | POA: Diagnosis present

## 2016-08-04 DIAGNOSIS — E039 Hypothyroidism, unspecified: Secondary | ICD-10-CM

## 2016-08-04 DIAGNOSIS — G893 Neoplasm related pain (acute) (chronic): Secondary | ICD-10-CM

## 2016-08-04 DIAGNOSIS — C7800 Secondary malignant neoplasm of unspecified lung: Secondary | ICD-10-CM

## 2016-08-04 LAB — COMPREHENSIVE METABOLIC PANEL
ALBUMIN: 2.2 g/dL — AB (ref 3.5–5.0)
ALK PHOS: 85 U/L (ref 40–150)
ALT: 9 U/L (ref 0–55)
AST: 19 U/L (ref 5–34)
Anion Gap: 11 mEq/L (ref 3–11)
BILIRUBIN TOTAL: 1.14 mg/dL (ref 0.20–1.20)
BUN: 9.9 mg/dL (ref 7.0–26.0)
CALCIUM: 10.1 mg/dL (ref 8.4–10.4)
CO2: 33 mEq/L — ABNORMAL HIGH (ref 22–29)
Chloride: 91 mEq/L — ABNORMAL LOW (ref 98–109)
Creatinine: 0.6 mg/dL — ABNORMAL LOW (ref 0.7–1.3)
Glucose: 119 mg/dl (ref 70–140)
POTASSIUM: 2.5 meq/L — AB (ref 3.5–5.1)
Sodium: 135 mEq/L — ABNORMAL LOW (ref 136–145)
TOTAL PROTEIN: 5.8 g/dL — AB (ref 6.4–8.3)

## 2016-08-04 LAB — CBC WITH DIFFERENTIAL/PLATELET
BASO%: 0.2 % (ref 0.0–2.0)
BASOS ABS: 0 10*3/uL (ref 0.0–0.1)
EOS ABS: 0.2 10*3/uL (ref 0.0–0.5)
EOS%: 1.2 % (ref 0.0–7.0)
HEMATOCRIT: 24.2 % — AB (ref 38.4–49.9)
HEMOGLOBIN: 7.6 g/dL — AB (ref 13.0–17.1)
LYMPH#: 0.9 10*3/uL (ref 0.9–3.3)
LYMPH%: 7.2 % — ABNORMAL LOW (ref 14.0–49.0)
MCH: 27.8 pg (ref 27.2–33.4)
MCHC: 31.4 g/dL — ABNORMAL LOW (ref 32.0–36.0)
MCV: 88.6 fL (ref 79.3–98.0)
MONO#: 1 10*3/uL — AB (ref 0.1–0.9)
MONO%: 7.7 % (ref 0.0–14.0)
NEUT#: 10.6 10*3/uL — ABNORMAL HIGH (ref 1.5–6.5)
NEUT%: 83.7 % — ABNORMAL HIGH (ref 39.0–75.0)
PLATELETS: 332 10*3/uL (ref 140–400)
RBC: 2.73 10*6/uL — ABNORMAL LOW (ref 4.20–5.82)
RDW: 16.3 % — ABNORMAL HIGH (ref 11.0–14.6)
WBC: 12.6 10*3/uL — ABNORMAL HIGH (ref 4.0–10.3)
nRBC: 0 % (ref 0–0)

## 2016-08-04 LAB — PREPARE RBC (CROSSMATCH)

## 2016-08-04 MED ORDER — HEPARIN SOD (PORK) LOCK FLUSH 100 UNIT/ML IV SOLN
500.0000 [IU] | Freq: Every day | INTRAVENOUS | Status: DC | PRN
  Filled 2016-08-04: qty 5

## 2016-08-04 MED ORDER — SODIUM CHLORIDE 0.9 % IV SOLN
250.0000 mL | Freq: Once | INTRAVENOUS | Status: AC
Start: 2016-08-04 — End: 2016-08-04
  Administered 2016-08-04: 250 mL via INTRAVENOUS

## 2016-08-04 MED ORDER — SODIUM CHLORIDE 0.9% FLUSH
10.0000 mL | INTRAVENOUS | Status: DC | PRN
  Administered 2016-08-04: 10 mL via INTRAVENOUS
  Filled 2016-08-04: qty 10

## 2016-08-04 MED ORDER — DIPHENHYDRAMINE HCL 25 MG PO CAPS
25.0000 mg | ORAL_CAPSULE | Freq: Once | ORAL | Status: AC
  Administered 2016-08-04: 25 mg via ORAL

## 2016-08-04 MED ORDER — DIPHENHYDRAMINE HCL 25 MG PO CAPS
ORAL_CAPSULE | ORAL | Status: AC
  Filled 2016-08-04: qty 1

## 2016-08-04 MED ORDER — SODIUM CHLORIDE 0.9% FLUSH
10.0000 mL | INTRAVENOUS | Status: AC | PRN
  Administered 2016-08-04: 10 mL
  Filled 2016-08-04: qty 10

## 2016-08-04 MED ORDER — POTASSIUM CHLORIDE 20 MEQ/15ML (10%) PO SOLN: 20.0000 meq | Freq: Two times a day (BID) | ORAL | 0 refills | Status: AC

## 2016-08-04 MED ORDER — ACETAMINOPHEN 325 MG PO TABS
ORAL_TABLET | ORAL | Status: AC
  Filled 2016-08-04: qty 2

## 2016-08-04 MED ORDER — ACETAMINOPHEN 325 MG PO TABS
650.0000 mg | ORAL_TABLET | Freq: Once | ORAL | Status: AC
  Administered 2016-08-04: 650 mg via ORAL

## 2016-08-04 MED ORDER — HEPARIN SOD (PORK) LOCK FLUSH 100 UNIT/ML IV SOLN
500.0000 [IU] | Freq: Once | INTRAVENOUS | Status: AC
  Administered 2016-08-04: 500 [IU] via INTRAVENOUS
  Filled 2016-08-04: qty 5

## 2016-08-04 MED ORDER — LEVOTHYROXINE SODIUM 50 MCG PO TABS: 50.0000 ug | ORAL_TABLET | Freq: Every day | ORAL | 3 refills | Status: AC

## 2016-08-04 MED FILL — POTASSIUM CL 10% (20 MEQ/15: 20 MEQ/15ML | 10 days supply | Qty: 300 | Fill #0

## 2016-08-04 MED FILL — LEVOTHYROXINE 50 MCG TABLET: 50 | 30 days supply | Qty: 30 | Fill #0

## 2016-08-04 NOTE — Patient Instructions (Addendum)
Potassium prescription called in to [facility].  Take Potassium 20 MEQ twice a day for 10 days.  Blood Transfusion , Adult A blood transfusion is a procedure in which you receive donated blood, including plasma, platelets, and red blood cells, through an IV tube. You may need a blood transfusion because of illness, surgery, or injury. The blood may come from a donor. You may also be able to donate blood for yourself (autologous blood donation) before a surgery if you know that you might require a blood transfusion. The blood given in a transfusion is made up of different types of cells. You may receive:  Red blood cells. These carry oxygen to the cells in the body.  White blood cells. These help you fight infections.  Platelets. These help your blood to clot.  Plasma. This is the liquid part of your blood and it helps with fluid imbalances. If you have hemophilia or another clotting disorder, you may also receive other types of blood products. Tell a health care provider about:  Any allergies you have.  All medicines you are taking, including vitamins, herbs, eye drops, creams, and over-the-counter medicines.  Any problems you or family members have had with anesthetic medicines.  Any blood disorders you have.  Any surgeries you have had.  Any medical conditions you have, including any recent fever or cold symptoms.  Whether you are pregnant or may be pregnant.  Any previous reactions you have had during a blood transfusion. What are the risks? Generally, this is a safe procedure. However, problems may occur, including:  Having an allergic reaction to something in the donated blood. Hives and itching may be symptoms of this type of reaction.  Fever. This may be a reaction to the white blood cells in the transfused blood. Nausea or chest pain may accompany a fever.  Iron overload. This can happen from having many transfusions.  Transfusion-related acute  lung injury (TRALI). This is a rare reaction that causes lung damage. The cause is not known.TRALI can occur within hours of a transfusion or several days later.  Sudden (acute) or delayed hemolytic reactions. This happens if your blood does not match the cells in your transfusion. Your body's defense system (immune system) may try to attack the new cells. This complication is rare. The symptoms include fever, chills, nausea, and low back pain or chest pain.  Infection or disease transmission. This is rare. What happens before the procedure?  You will have a blood test to determine your blood type. This is necessary to know what kind of blood your body will accept and to match it to the donor blood.  If you are going to have a planned surgery, you may be able to do an autologous blood donation. This may be done in case you need to have a transfusion.  If you have had an allergic reaction to a transfusion in the past, you may be given medicine to help prevent a reaction. This medicine may be given to you by mouth or through an IV tube.  You will have your temperature, blood pressure, and pulse monitored before the transfusion.  Follow instructions from your health care provider about eating and drinking restrictions.  Ask your health care provider about:  Changing or stopping your regular medicines. This is especially important if you are taking diabetes medicines or blood thinners.  Taking medicines such as aspirin and ibuprofen. These medicines can thin your blood. Do not take these medicines before your  procedure if your health care provider instructs you not to. What happens during the procedure?  An IV tube will be inserted into one of your veins.  The bag of donated blood will be attached to your IV tube. The blood will then enter through your vein.  Your temperature, blood pressure, and pulse will be monitored regularly during the transfusion. This monitoring is done to detect  early signs of a transfusion reaction.  If you have any signs or symptoms of a reaction, your transfusion will be stopped and you may be given medicine.  When the transfusion is complete, your IV tube will be removed.  Pressure may be applied to the IV site for a few minutes.  A bandage (dressing) will be applied. The procedure may vary among health care providers and hospitals. What happens after the procedure?  Your temperature, blood pressure, heart rate, breathing rate, and blood oxygen level will be monitored often.  Your blood may be tested to see how you are responding to the transfusion.  You may be warmed with fluids or blankets to maintain a normal body temperature. Summary  A blood transfusion is a procedure in which you receive donated blood, including plasma, platelets, and red blood cells, through an IV tube.  Your temperature, blood pressure, and pulse will be monitored before, during, and after the transfusion.  Your blood may be tested after the transfusion to see how your body has responded. This information is not intended to replace advice given to you by your health care provider. Make sure you discuss any questions you have with your health care provider. Document Released: 08/01/2000 Document Revised: 05/01/2016 Document Reviewed: 05/01/2016 Elsevier Interactive Patient Education  2017 [employer].

## 2016-08-04 NOTE — Assessment & Plan Note (Signed)
This is related to prior radiation exposure. I refill his prescription of thyroid medicine

## 2016-08-04 NOTE — Assessment & Plan Note (Signed)
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 I recommend oral potassium replacement therapy

## 2016-08-04 NOTE — Progress Notes (Signed)
[facility] OFFICE PROGRESS NOTE  Patient Care Team: No Pcp Per Patient as PCP - General (General Practice) [name], MD as Consulting Physician (Otolaryngology) [name], MD as Attending Physician (Radiation Oncology) [name], MD as Consulting Physician (Hematology and Oncology) Leota Sauers, RN as Oncology Nurse Navigator Lenn Cal, DDS as Consulting Physician (Dentistry)  SUMMARY OF ONCOLOGIC HISTORY: Oncology History   Carcinoma of supraglottis   Staging form: Larynx - Supraglottis, AJCC 7th Edition     Clinical stage from 03/13/2015: Stage IVA (T3, N2b, M0) - Signed by [name], MD on 03/13/2015     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[place])   01/22/2015 Imaging    CT scan showed enhancing infiltrative mass centered at the right piriform sinus with bilateral neck lymphadenopathy      02/22/2015 - 03/08/2015 Hospital Admission    He was admitted to the hospital for management of advanced supraglottic cancer status post tracheostomy, biopsy, dental extraction, placement of feeding tube and subsequent discharge to skilled nursing facility      02/22/2015 Pathology Results    Accession: [id number] biopsy of supraglottic region 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      03/09/2015 PET scan    1)  Right piriform sinus mass with right level 2 nodal metastasis.  2)  Hypermetabolic thoracic nodes which are at least partially felt to be reactive.  3)  No evidence of subdiaphragmatic metastatic disease.       03/16/2015 Procedure    He has placement of port      03/21/2015 - 05/04/2015 Chemotherapy    He received high dose cisplatin x 3 cycles      03/21/2015 - 05/09/2015 Radiation Therapy    Tomotherapy to supraglottic tumor and bilateral neck:  70 Gy in 35 fractions to gross disease, 63 Gy in 35 fractions to high risk nodal echelons, and 56 Gy in 35 fractions to  intermediate risk nodal echelons.      05/09/2015 Miscellaneous    He went to the ED with fall      06/20/2015 Procedure    Tracheostomy was removed      09/06/2015 - 09/08/2015 Hospital Admission     he was admitted to the hospital with alcohol intoxication, dehydration and pain. CT scan of the chest show bilateral metastatic disease to the lung      10/01/2015 Imaging     PET CT scan showed diffuse, multiple hypermetabolic activity in both lungs suspicious for metastatic cancer      10/01/2015 Adverse Reaction    he received cetuximab, complicated by anaphylactic reaction      10/01/2015 - 10/03/2015 Hospital Admission    He was admitted to the hospital, intubated for cardiac arrest      10/15/2015 - 11/23/2015 Chemotherapy    He began palliative chemo with carboplatin and 5FU      10/29/2015 Adverse Reaction    5FU dose reduce by 10% due to nausea and vomiting      11/23/2015 Imaging    Ct chest showed disease progression      01/02/2016 - 03/06/2016 Chemotherapy    He received Keytruda      03/25/2016 Imaging    Interval progression of disease. There is enlarging anterior mediastinal soft tissue mass. Enlargement of previous pulmonary nodules as well as development of multiple new pulmonary nodules. New mass along the undersurface  of the right hemidiaphragm       03/25/2016 Imaging    CT neck showed resolution of right-sided hypo pharyngeal mass. Resolution of right-sided cervical adenopathy. No new adenopathy. Soft tissue edema around the glottis compatible with radiation change.      05/07/2016 - 06/19/2016 Chemotherapy    The patient had palliative chemo with taxotere. First dose was delayed due to patient not feeling well      07/11/2016 Imaging    CT chest showed innumerable pulmonary metastases are significantly increased in size and number since 03/25/2016. 2. New small right and trace left pleural effusions. 3. New right pericardiophrenic nodal metastases. Otherwise  stable previously described mediastinal and right hilar nodal metastases. 4. Large locally invasive metastasis centered at the anterior right hemidiaphragm, significantly increased. 5. New small liver metastasis. 6. Mild emphysema.       INTERVAL HISTORY: Please see below for problem oriented charting. He returns today with his sister for further follow-up. His pain appears better controlled with current prescription pain regimen. He complained of mild fatigue. The patient denies any recent signs or symptoms of bleeding such as spontaneous epistaxis, hematuria or hematochezia.   REVIEW OF SYSTEMS:   Constitutional: Denies fevers, chills or abnormal weight loss Eyes: Denies blurriness of vision Ears, nose, mouth, throat, and face: Denies mucositis or sore throat Respiratory: Denies cough, dyspnea or wheezes Cardiovascular: Denies palpitation, chest discomfort or lower extremity swelling Gastrointestinal:  Denies nausea, heartburn or change in bowel habits Skin: Denies abnormal skin rashes Lymphatics: Denies new lymphadenopathy or easy bruising Neurological:Denies numbness, tingling or new weaknesses Behavioral/Psych: Mood is stable, no new changes  All other systems were reviewed with the patient and are negative.  I have reviewed the past medical history, past surgical history, social history and family history with the patient and they are unchanged from previous note.  ALLERGIES:  is allergic to cetuximab.  MEDICATIONS:  Current Outpatient Prescriptions  Medication Sig Dispense Refill  . fludrocortisone (FLORINEF) 0.1 MG tablet Take 2 tablets (0.2 mg total) by mouth daily. 90 tablet 6  . FLUoxetine (PROZAC) 40 MG capsule Take 1 capsule (40 mg total) by mouth daily. 10 capsule 1  . ipratropium (ATROVENT) 0.06 % nasal spray Place 2 sprays into both nostrils 4 (four) times daily. 15 mL 1  . levothyroxine (SYNTHROID) 50 MCG tablet Take 1 tablet (50 mcg total) by mouth daily before  breakfast. 30 tablet 3  . magic mouthwash w/lidocaine SOLN Take 5 mLs by mouth 4 (four) times daily as needed for mouth pain. 180 mL 0  . morphine (MSIR) 30 MG tablet Take 2 tablets (60 mg total) by mouth every 4 (four) hours as needed for severe pain. 90 tablet 0  . ondansetron (ZOFRAN) 8 MG tablet Take 1 tablet (8 mg total) by mouth every 8 (eight) hours as needed for nausea. 90 tablet 3  . potassium chloride SA (K-DUR,KLOR-CON) 20 MEQ tablet Take 1 tablet (20 mEq total) by mouth 2 (two) times daily. 180 tablet 0  . predniSONE (DELTASONE) 10 MG tablet Take 1 tablet (10 mg total) by mouth daily with breakfast. 30 tablet 3  . prochlorperazine (COMPAZINE) 10 MG tablet Take 1 tablet (10 mg total) by mouth every 6 (six) hours as needed. 90 tablet 3  . senna (SENOKOT) 8.6 MG TABS tablet Take 1 tablet (8.6 mg total) by mouth 2 (two) times daily. (Patient taking differently: Take 1 tablet by mouth 2 (two) times daily as needed for mild constipation (when  taking pain medication.). ) 60 each 0  . traZODone (DESYREL) 100 MG tablet Take 1 tablet (100 mg total) by mouth at bedtime as needed for sleep. 30 tablet 3  . triamcinolone cream (KENALOG) 0.1 % Apply topically 2 (two) times daily. 30 g 0  . methadone (DOLOPHINE) 10 MG tablet Take 10 mg by mouth 2 (two) times daily.  0   No current facility-administered medications for this visit.    Facility-Administered Medications Ordered in Other Visits  Medication Dose Route Frequency Provider Last Rate Last Dose  . 0.9 %  sodium chloride infusion   Intravenous Once [name], MD      . heparin lock flush 100 unit/mL  500 Units Intracatheter PRN [name], MD   500 Units at 04/01/16 1242  . heparin lock flush 100 unit/mL  250 Units Intracatheter PRN [name], MD      . heparin lock flush 100 unit/mL  500 Units Intracatheter Daily PRN [name], MD      . sodium chloride flush (NS) 0.9 % injection 10 mL  10 mL Intravenous PRN [name], MD   10 mL at  10/01/15 0956    PHYSICAL EXAMINATION: ECOG PERFORMANCE STATUS: 1 - Symptomatic but completely ambulatory  Vitals:   08/04/16 1000  BP: 110/66  Pulse: 92  Resp: 18  Temp: 98.4 F (36.9 C)   Filed Weights   08/04/16 1000  Weight: 127 lb 14.4 oz (58 kg)    GENERAL:alert, no distress and comfortable. He appears thin and cachectic SKIN: skin color, texture, turgor are normal, no rashes or significant lesions EYES: normal, Conjunctiva are pink and non-injected, sclera clear OROPHARYNX:no exudate, no erythema and lips, buccal mucosa, and tongue normal  NECK: supple, thyroid normal size, non-tender, without nodularity LYMPH:  no palpable lymphadenopathy in the cervical, axillary or inguinal LUNGS: clear to auscultation and percussion with normal breathing effort HEART: regular rate & rhythm and no murmurs and no lower extremity edema ABDOMEN:abdomen soft, non-tender and normal bowel sounds Musculoskeletal:no cyanosis of digits and no clubbing  NEURO: alert & oriented x 3 with fluent speech, no focal motor/sensory deficits  LABORATORY DATA:  I have reviewed the data as listed    Component Value Date/Time   NA 135 (L) 08/04/2016 0914   K 2.5 (LL) 08/04/2016 0914   CL 105 12/24/2015 1716   CO2 33 (H) 08/04/2016 0914   GLUCOSE 119 08/04/2016 0914   BUN 9.9 08/04/2016 0914   CREATININE 0.6 (L) 08/04/2016 0914   CALCIUM 10.1 08/04/2016 0914   PROT 5.8 (L) 08/04/2016 0914   ALBUMIN 2.2 (L) 08/04/2016 0914   AST 19 08/04/2016 0914   ALT 9 08/04/2016 0914   ALKPHOS 85 08/04/2016 0914   BILITOT 1.14 08/04/2016 0914   GFRNONAA >60 12/24/2015 1716   GFRAA >60 12/24/2015 1716    No results found for: SPEP, UPEP  Lab Results  Component Value Date   WBC 12.6 (H) 08/04/2016   NEUTROABS 10.6 (H) 08/04/2016   HGB 7.6 (L) 08/04/2016   HCT 24.2 (L) 08/04/2016   MCV 88.6 08/04/2016   PLT 332 08/04/2016      Chemistry      Component Value Date/Time   NA 135 (L) 08/04/2016 0914    K 2.5 (LL) 08/04/2016 0914   CL 105 12/24/2015 1716   CO2 33 (H) 08/04/2016 0914   BUN 9.9 08/04/2016 0914   CREATININE 0.6 (L) 08/04/2016 0914   GLU 82 07/19/2015  Component Value Date/Time   CALCIUM 10.1 08/04/2016 0914   ALKPHOS 85 08/04/2016 0914   AST 19 08/04/2016 0914   ALT 9 08/04/2016 0914   BILITOT 1.14 08/04/2016 0914       RADIOGRAPHIC STUDIES: I have personally reviewed the radiological images as listed and agreed with the findings in the report. 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 EXAM: CT CHEST WITH CONTRAST TECHNIQUE: Multidetector CT imaging of the chest was performed during intravenous contrast administration. CONTRAST:  75 cc ISOVUE-300 IOPAMIDOL (ISOVUE-300) INJECTION 61% COMPARISON:  03/25/2016 chest CT. FINDINGS: Cardiovascular: Normal heart size. Trace pericardial effusion/thickening is slightly increased. Right internal jugular MediPort terminates at the cavoatrial junction. Great vessels are normal in course and caliber. No central pulmonary emboli. Mediastinum/Nodes: No discrete thyroid nodules. Unremarkable esophagus. No axillary adenopathy. Infiltrative appearing right anterior mediastinal 2.6 cm node (series 2/ image 54), previously 2.6 cm, stable. Mildly enlarged subcarinal 1.3 cm node (series 2/image 70), previously 1.2 cm using similar measurement technique, not appreciably changed. New mildly enlarged 1.2 cm right pericardiophrenic node (series 2/ image 136). Enlarged 1.3 cm right hilar node (series 2/image 70), previously 1.2 cm, not appreciably changed. No additional pathologically enlarged mediastinal or hilar nodes. Lungs/Pleura: New small right and trace left pleural effusions. No pneumothorax. Mild centrilobular and paraseptal emphysema. Innumerable (> than 40)  irregular solid pulmonary nodules scattered throughout both lungs are significantly increased in size and number. For example, a conglomerate peripheral right middle lobe 3.1 x 2.9 cm nodules (series 4/ image 88), increased from 2.4 x 2.0 cm. Medial right lower lobe 4.0 x 2.1 cm mass (series 4/ image 123), increased from 2.1 x 1.5 cm. Medial left lower lobe 2.0 x 1.6 cm nodule (series 4/ image 144) is new. No acute consolidative airspace disease. Upper abdomen: Infiltrative 7.7 x 4.0 cm mass centered in the right anterior hemidiaphragm with mass-effect on the adjacent liver and invasion of the adjacent intercostal space anteriorly (series 2/ image 147), significantly increased from 3.7 x 2.8 cm. New 1.2 cm hypodense left liver lobe mass (series 2/ image 156). Musculoskeletal:  No aggressive appearing focal osseous lesions. IMPRESSION: 1. Innumerable pulmonary metastases are significantly increased in size and number since 03/25/2016. 2. New small right and trace left pleural effusions. 3. New right pericardiophrenic nodal metastases. Otherwise stable previously described mediastinal and right hilar nodal metastases. 4. Large locally invasive metastasis centered at the anterior right hemidiaphragm, significantly increased. 5. New small liver metastasis. 6. Mild emphysema. Electronically Signed   By: Ilona Sorrel M.D.   On: 07/11/2016 12:01    ASSESSMENT & PLAN:  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[place]) Unfortunately, repeat imaging study showed diffuse widespread disease progression I reviewed the current guidelines and I do not recommend further therapy due to expected low response to treatment and poor baseline performance status I recommend we focus on palliative care and consider hospice. He is currently enrolled in hospice program. 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 The patient is symptomatic from anemia and the hemoglobin level is critically low.  Some of the side-effects to be expected including risks of transfusion reactions, chills, infection, syndrome of volume overload and risk of hospitalization  from various reasons and the patient is willing to proceed and went ahead to sign consent today.   Acquired hypothyroidism This is related to prior radiation exposure. I refill his prescription of thyroid medicine  Cancer associated pain His pain is better controlled with addition of methadone. 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 I recommend oral potassium replacement therapy   No orders of the defined types were placed in this encounter.  All questions were answered. The patient knows to call the clinic with any problems, questions or concerns. No barriers to learning was detected. I spent 25 minutes counseling the patient face to face. The total time spent in the appointment was 30 minutes and more than 50% was on counseling and review of test results     [name], MD 08/04/2016 10:45 AM

## 2016-08-04 NOTE — Assessment & Plan Note (Signed)
His pain is better controlled with addition of methadone. I appreciate the assistance from hospice program to adjust his pain medicine

## 2016-08-04 NOTE — Assessment & Plan Note (Signed)
Unfortunately, repeat imaging study showed diffuse widespread disease progression I reviewed the current guidelines and I do not recommend further therapy due to expected low response to treatment and poor baseline performance status I recommend we focus on palliative care and consider hospice. He is currently enrolled in hospice program. He agree for supportive care only and to continue transfusion support

## 2016-08-04 NOTE — Telephone Encounter (Signed)
Appointments scheduled per 12/18 LOS. Patient given AVS report and calendars with future scheduled appointments.  °

## 2016-08-04 NOTE — Assessment & Plan Note (Signed)
The patient has anemia due to cancer He has symptomatic fatigue We discussed some of the risks, benefits, and alternatives of blood transfusions. The patient is symptomatic from anemia and the hemoglobin level is critically low.  Some of the side-effects to be expected including risks of transfusion reactions, chills, infection, syndrome of volume overload and risk of hospitalization from various reasons and the patient is willing to proceed and went ahead to sign consent today.

## 2016-08-05 LAB — TYPE AND SCREEN
Blood Product Expiration Date: 201712202359
Blood Product Expiration Date: 201712232359
ISSUE DATE / TIME: 201712181330
ISSUE DATE / TIME: 201712181330
UNIT TYPE AND RH: 9500
Unit Type and Rh: 9500

## 2016-08-26 ENCOUNTER — Encounter: Payer: Self-pay | Admitting: *Deleted

## 2016-08-26 ENCOUNTER — Other Ambulatory Visit: Payer: Self-pay | Admitting: Nurse Practitioner

## 2016-08-26 NOTE — Progress Notes (Signed)
Per [facility], 08/26/16:  [name], [id number], passed away early 2022-12-03 morning, 10-Sep-2016, at his home with his family by his side after a long and courageous battle with cancer.

## 2016-08-28 ENCOUNTER — Other Ambulatory Visit: Payer: Self-pay

## 2016-08-28 ENCOUNTER — Ambulatory Visit: Payer: Self-pay | Admitting: Hematology and Oncology

## 2016-09-04 ENCOUNTER — Encounter: Payer: Self-pay | Admitting: *Deleted

## 2016-09-18 DEATH — deceased
